# Patient Record
Sex: Female | Born: 1960 | Race: White | Hispanic: No | Smoking: Former smoker
Health system: Southern US, Community
[De-identification: ages and names within clinical notes are randomized; demographics above are authoritative.]

## PROBLEM LIST (undated history)

## (undated) DIAGNOSIS — F32A Depression, unspecified: Secondary | ICD-10-CM

## (undated) DIAGNOSIS — K219 Gastro-esophageal reflux disease without esophagitis: Secondary | ICD-10-CM

## (undated) DIAGNOSIS — E538 Deficiency of other specified B group vitamins: Secondary | ICD-10-CM

## (undated) DIAGNOSIS — M79606 Pain in leg, unspecified: Secondary | ICD-10-CM

## (undated) DIAGNOSIS — F329 Major depressive disorder, single episode, unspecified: Secondary | ICD-10-CM

## (undated) DIAGNOSIS — IMO0001 Reserved for inherently not codable concepts without codable children: Secondary | ICD-10-CM

## (undated) DIAGNOSIS — G9050 Complex regional pain syndrome I, unspecified: Secondary | ICD-10-CM

## (undated) DIAGNOSIS — J449 Chronic obstructive pulmonary disease, unspecified: Secondary | ICD-10-CM

## (undated) DIAGNOSIS — M25529 Pain in unspecified elbow: Secondary | ICD-10-CM

## (undated) DIAGNOSIS — I10 Essential (primary) hypertension: Secondary | ICD-10-CM

## (undated) DIAGNOSIS — I509 Heart failure, unspecified: Secondary | ICD-10-CM

## (undated) DIAGNOSIS — E559 Vitamin D deficiency, unspecified: Secondary | ICD-10-CM

## (undated) DIAGNOSIS — F419 Anxiety disorder, unspecified: Secondary | ICD-10-CM

## (undated) DIAGNOSIS — Z9689 Presence of other specified functional implants: Secondary | ICD-10-CM

## (undated) HISTORY — DX: Deficiency of other specified B group vitamins: E53.8

## (undated) HISTORY — PX: SPINAL CORD STIMULATOR IMPLANT: SHX2422

## (undated) HISTORY — DX: Gastro-esophageal reflux disease without esophagitis: K21.9

## (undated) HISTORY — DX: Anxiety disorder, unspecified: F41.9

## (undated) HISTORY — DX: Presence of other specified functional implants: Z96.89

## (undated) HISTORY — DX: Reserved for inherently not codable concepts without codable children: IMO0001

## (undated) HISTORY — DX: Vitamin D deficiency, unspecified: E55.9

## (undated) HISTORY — DX: Heart failure, unspecified: I50.9

## (undated) HISTORY — DX: Chronic obstructive pulmonary disease, unspecified: J44.9

## (undated) HISTORY — DX: Essential (primary) hypertension: I10

## (undated) HISTORY — DX: Pain in unspecified elbow: M25.529

## (undated) HISTORY — DX: Complex regional pain syndrome I, unspecified: G90.50

## (undated) HISTORY — DX: Major depressive disorder, single episode, unspecified: F32.9

## (undated) HISTORY — DX: Pain in leg, unspecified: M79.606

## (undated) HISTORY — DX: Depression, unspecified: F32.A

---

## 2003-02-19 ENCOUNTER — Encounter: Admission: RE | Admit: 2003-02-19 | Discharge: 2003-02-19 | Payer: Self-pay | Admitting: Pain Medicine

## 2003-02-19 ENCOUNTER — Encounter: Payer: Self-pay | Admitting: Pain Medicine

## 2005-03-25 ENCOUNTER — Ambulatory Visit: Payer: Self-pay | Admitting: Physician Assistant

## 2005-03-26 ENCOUNTER — Ambulatory Visit: Payer: Self-pay | Admitting: Pain Medicine

## 2005-04-07 ENCOUNTER — Ambulatory Visit: Payer: Self-pay | Admitting: Pain Medicine

## 2005-04-15 ENCOUNTER — Ambulatory Visit: Payer: Self-pay | Admitting: Physician Assistant

## 2005-04-17 ENCOUNTER — Ambulatory Visit: Payer: Self-pay | Admitting: Pain Medicine

## 2005-04-23 ENCOUNTER — Ambulatory Visit: Payer: Self-pay | Admitting: Physician Assistant

## 2005-04-28 ENCOUNTER — Ambulatory Visit: Payer: Self-pay | Admitting: Pain Medicine

## 2005-04-29 ENCOUNTER — Ambulatory Visit: Payer: Self-pay | Admitting: Internal Medicine

## 2005-04-29 ENCOUNTER — Ambulatory Visit: Payer: Self-pay | Admitting: Physician Assistant

## 2005-05-20 ENCOUNTER — Ambulatory Visit: Payer: Self-pay | Admitting: Pain Medicine

## 2005-06-30 ENCOUNTER — Ambulatory Visit: Payer: Self-pay | Admitting: Pain Medicine

## 2005-07-22 ENCOUNTER — Ambulatory Visit: Payer: Self-pay | Admitting: Pain Medicine

## 2005-08-03 ENCOUNTER — Ambulatory Visit: Payer: Self-pay | Admitting: Pain Medicine

## 2005-08-13 ENCOUNTER — Emergency Department: Payer: Self-pay | Admitting: Emergency Medicine

## 2005-08-19 ENCOUNTER — Ambulatory Visit: Payer: Self-pay | Admitting: Physician Assistant

## 2005-08-20 ENCOUNTER — Ambulatory Visit: Payer: Self-pay | Admitting: Pain Medicine

## 2005-08-26 ENCOUNTER — Emergency Department: Payer: Self-pay | Admitting: Emergency Medicine

## 2005-08-31 ENCOUNTER — Ambulatory Visit: Payer: Self-pay | Admitting: Physician Assistant

## 2005-09-01 ENCOUNTER — Ambulatory Visit: Payer: Self-pay | Admitting: Pain Medicine

## 2005-09-21 ENCOUNTER — Ambulatory Visit: Payer: Self-pay | Admitting: Pain Medicine

## 2005-10-01 ENCOUNTER — Ambulatory Visit: Payer: Self-pay | Admitting: Pain Medicine

## 2005-11-02 ENCOUNTER — Ambulatory Visit: Payer: Self-pay | Admitting: Pain Medicine

## 2005-11-05 ENCOUNTER — Ambulatory Visit: Payer: Self-pay | Admitting: Physician Assistant

## 2005-11-17 ENCOUNTER — Ambulatory Visit: Payer: Self-pay | Admitting: Family Medicine

## 2005-12-07 ENCOUNTER — Ambulatory Visit: Payer: Self-pay | Admitting: Gastroenterology

## 2005-12-17 ENCOUNTER — Ambulatory Visit: Payer: Self-pay | Admitting: Gastroenterology

## 2005-12-28 ENCOUNTER — Ambulatory Visit: Payer: Self-pay | Admitting: Pain Medicine

## 2005-12-29 ENCOUNTER — Ambulatory Visit: Payer: Self-pay | Admitting: Pain Medicine

## 2006-01-27 ENCOUNTER — Ambulatory Visit: Payer: Self-pay | Admitting: Physician Assistant

## 2006-02-17 ENCOUNTER — Ambulatory Visit: Payer: Self-pay | Admitting: Pain Medicine

## 2006-02-25 ENCOUNTER — Ambulatory Visit: Payer: Self-pay | Admitting: Pain Medicine

## 2006-03-15 ENCOUNTER — Ambulatory Visit: Payer: Self-pay | Admitting: Pain Medicine

## 2006-03-16 ENCOUNTER — Ambulatory Visit: Payer: Self-pay | Admitting: Pain Medicine

## 2006-04-19 ENCOUNTER — Ambulatory Visit: Payer: Self-pay | Admitting: Pain Medicine

## 2006-05-04 ENCOUNTER — Ambulatory Visit: Payer: Self-pay | Admitting: Pain Medicine

## 2006-05-19 ENCOUNTER — Ambulatory Visit: Payer: Self-pay | Admitting: Pain Medicine

## 2006-06-01 ENCOUNTER — Ambulatory Visit: Payer: Self-pay | Admitting: Pain Medicine

## 2006-07-15 ENCOUNTER — Ambulatory Visit: Payer: Self-pay | Admitting: Physician Assistant

## 2006-08-11 ENCOUNTER — Ambulatory Visit: Payer: Self-pay | Admitting: Physician Assistant

## 2006-08-17 ENCOUNTER — Ambulatory Visit: Payer: Self-pay | Admitting: Pain Medicine

## 2006-09-10 ENCOUNTER — Ambulatory Visit: Payer: Self-pay | Admitting: Physician Assistant

## 2006-10-15 ENCOUNTER — Ambulatory Visit: Payer: Self-pay | Admitting: Physician Assistant

## 2006-11-15 ENCOUNTER — Ambulatory Visit: Payer: Self-pay | Admitting: Physician Assistant

## 2006-12-09 ENCOUNTER — Ambulatory Visit: Payer: Self-pay | Admitting: Physician Assistant

## 2007-01-11 ENCOUNTER — Ambulatory Visit: Payer: Self-pay | Admitting: Physician Assistant

## 2007-02-10 ENCOUNTER — Ambulatory Visit: Payer: Self-pay | Admitting: Physician Assistant

## 2007-03-11 ENCOUNTER — Ambulatory Visit: Payer: Self-pay | Admitting: Physician Assistant

## 2007-04-06 ENCOUNTER — Ambulatory Visit: Payer: Self-pay | Admitting: Physician Assistant

## 2007-05-05 ENCOUNTER — Ambulatory Visit: Payer: Self-pay | Admitting: Physician Assistant

## 2007-06-10 ENCOUNTER — Ambulatory Visit: Payer: Self-pay | Admitting: Physician Assistant

## 2007-07-11 ENCOUNTER — Ambulatory Visit: Payer: Self-pay | Admitting: Physician Assistant

## 2007-08-10 ENCOUNTER — Ambulatory Visit: Payer: Self-pay | Admitting: Pain Medicine

## 2007-08-12 ENCOUNTER — Ambulatory Visit: Payer: Self-pay | Admitting: Pain Medicine

## 2007-08-22 ENCOUNTER — Ambulatory Visit: Payer: Self-pay | Admitting: Pain Medicine

## 2007-08-23 ENCOUNTER — Ambulatory Visit: Payer: Self-pay | Admitting: Pain Medicine

## 2007-08-30 ENCOUNTER — Ambulatory Visit: Payer: Self-pay | Admitting: Pain Medicine

## 2007-09-07 ENCOUNTER — Ambulatory Visit: Payer: Self-pay | Admitting: Physician Assistant

## 2007-09-21 ENCOUNTER — Ambulatory Visit: Payer: Self-pay | Admitting: Physician Assistant

## 2007-09-28 ENCOUNTER — Ambulatory Visit: Payer: Self-pay | Admitting: Physician Assistant

## 2007-11-09 ENCOUNTER — Ambulatory Visit: Payer: Self-pay | Admitting: Physician Assistant

## 2007-12-08 ENCOUNTER — Ambulatory Visit: Payer: Self-pay | Admitting: Physician Assistant

## 2008-01-18 ENCOUNTER — Emergency Department: Payer: Self-pay | Admitting: Emergency Medicine

## 2008-01-25 ENCOUNTER — Ambulatory Visit: Payer: Self-pay | Admitting: Physician Assistant

## 2008-01-31 ENCOUNTER — Ambulatory Visit: Payer: Self-pay | Admitting: Pain Medicine

## 2008-02-07 ENCOUNTER — Ambulatory Visit: Payer: Self-pay | Admitting: Pain Medicine

## 2008-02-15 ENCOUNTER — Ambulatory Visit: Payer: Self-pay | Admitting: Pain Medicine

## 2008-03-29 ENCOUNTER — Ambulatory Visit: Payer: Self-pay | Admitting: Physician Assistant

## 2008-05-02 ENCOUNTER — Ambulatory Visit: Payer: Self-pay | Admitting: Physician Assistant

## 2008-05-30 ENCOUNTER — Ambulatory Visit: Payer: Self-pay | Admitting: Physician Assistant

## 2008-06-28 ENCOUNTER — Ambulatory Visit: Payer: Self-pay | Admitting: Physician Assistant

## 2008-07-30 ENCOUNTER — Ambulatory Visit: Payer: Self-pay | Admitting: Physician Assistant

## 2008-08-30 ENCOUNTER — Ambulatory Visit: Payer: Self-pay | Admitting: Physician Assistant

## 2008-09-27 ENCOUNTER — Ambulatory Visit: Payer: Self-pay | Admitting: Physician Assistant

## 2008-10-25 ENCOUNTER — Ambulatory Visit: Payer: Self-pay | Admitting: Physician Assistant

## 2008-11-28 ENCOUNTER — Ambulatory Visit: Payer: Self-pay | Admitting: Physician Assistant

## 2008-12-26 ENCOUNTER — Ambulatory Visit: Payer: Self-pay | Admitting: Pain Medicine

## 2009-01-23 ENCOUNTER — Ambulatory Visit: Payer: Self-pay | Admitting: Physician Assistant

## 2009-01-24 ENCOUNTER — Ambulatory Visit: Payer: Self-pay | Admitting: Pain Medicine

## 2009-02-21 ENCOUNTER — Ambulatory Visit: Payer: Self-pay | Admitting: Physician Assistant

## 2009-03-28 ENCOUNTER — Ambulatory Visit: Payer: Self-pay | Admitting: Physician Assistant

## 2009-04-25 ENCOUNTER — Ambulatory Visit: Payer: Self-pay | Admitting: Physician Assistant

## 2009-05-28 ENCOUNTER — Ambulatory Visit: Payer: Self-pay | Admitting: Physician Assistant

## 2009-06-12 ENCOUNTER — Inpatient Hospital Stay: Payer: Self-pay | Admitting: Internal Medicine

## 2009-06-27 ENCOUNTER — Ambulatory Visit: Payer: Self-pay | Admitting: Physician Assistant

## 2009-07-25 ENCOUNTER — Ambulatory Visit: Payer: Self-pay | Admitting: Physician Assistant

## 2009-08-06 ENCOUNTER — Ambulatory Visit: Payer: Self-pay | Admitting: Emergency Medicine

## 2009-08-27 ENCOUNTER — Ambulatory Visit: Payer: Self-pay | Admitting: Physician Assistant

## 2009-09-26 ENCOUNTER — Ambulatory Visit: Payer: Self-pay | Admitting: Physician Assistant

## 2009-10-24 ENCOUNTER — Ambulatory Visit: Payer: Self-pay | Admitting: Physician Assistant

## 2009-11-25 ENCOUNTER — Ambulatory Visit: Payer: Self-pay | Admitting: Pain Medicine

## 2009-12-26 ENCOUNTER — Ambulatory Visit: Payer: Self-pay | Admitting: Pain Medicine

## 2010-01-17 ENCOUNTER — Ambulatory Visit: Payer: Self-pay | Admitting: Pain Medicine

## 2010-01-27 ENCOUNTER — Ambulatory Visit: Payer: Self-pay | Admitting: Pain Medicine

## 2010-02-20 ENCOUNTER — Ambulatory Visit: Payer: Self-pay | Admitting: Pain Medicine

## 2010-03-17 ENCOUNTER — Ambulatory Visit: Payer: Self-pay | Admitting: Pain Medicine

## 2010-04-11 ENCOUNTER — Emergency Department: Payer: Self-pay | Admitting: Internal Medicine

## 2010-06-19 ENCOUNTER — Ambulatory Visit: Payer: Self-pay | Admitting: Pain Medicine

## 2010-07-07 ENCOUNTER — Ambulatory Visit: Payer: Self-pay | Admitting: Pain Medicine

## 2010-08-15 ENCOUNTER — Ambulatory Visit: Payer: Self-pay | Admitting: Pain Medicine

## 2010-08-19 ENCOUNTER — Ambulatory Visit: Payer: Self-pay | Admitting: Pain Medicine

## 2010-08-26 ENCOUNTER — Ambulatory Visit: Payer: Self-pay | Admitting: Pain Medicine

## 2010-09-10 ENCOUNTER — Ambulatory Visit: Payer: Self-pay | Admitting: Family Medicine

## 2010-09-10 ENCOUNTER — Ambulatory Visit: Payer: Self-pay | Admitting: Pain Medicine

## 2010-09-18 ENCOUNTER — Ambulatory Visit: Payer: Self-pay | Admitting: Pain Medicine

## 2010-11-27 ENCOUNTER — Ambulatory Visit: Payer: Self-pay | Admitting: Pain Medicine

## 2010-12-22 ENCOUNTER — Other Ambulatory Visit: Payer: Self-pay | Admitting: Pain Medicine

## 2010-12-23 ENCOUNTER — Ambulatory Visit: Payer: Self-pay | Admitting: Pain Medicine

## 2011-01-01 ENCOUNTER — Ambulatory Visit: Payer: Self-pay | Admitting: Pain Medicine

## 2011-01-13 ENCOUNTER — Ambulatory Visit: Payer: Self-pay | Admitting: Pain Medicine

## 2011-01-22 ENCOUNTER — Ambulatory Visit: Payer: Self-pay | Admitting: Pain Medicine

## 2011-02-12 ENCOUNTER — Ambulatory Visit: Payer: Self-pay | Admitting: Pain Medicine

## 2011-03-02 ENCOUNTER — Ambulatory Visit: Payer: Self-pay | Admitting: Pain Medicine

## 2011-04-06 ENCOUNTER — Ambulatory Visit: Payer: Self-pay | Admitting: Pain Medicine

## 2011-04-30 ENCOUNTER — Other Ambulatory Visit: Payer: Self-pay | Admitting: Pain Medicine

## 2011-07-06 ENCOUNTER — Ambulatory Visit: Payer: Self-pay | Admitting: Pain Medicine

## 2011-09-30 ENCOUNTER — Ambulatory Visit: Payer: Self-pay | Admitting: Pain Medicine

## 2011-10-21 ENCOUNTER — Ambulatory Visit: Payer: Self-pay | Admitting: Pain Medicine

## 2011-10-22 ENCOUNTER — Ambulatory Visit: Payer: Self-pay | Admitting: Pain Medicine

## 2012-01-20 ENCOUNTER — Ambulatory Visit: Payer: Self-pay | Admitting: Pain Medicine

## 2012-04-12 ENCOUNTER — Ambulatory Visit: Payer: Self-pay | Admitting: Pain Medicine

## 2012-05-04 ENCOUNTER — Ambulatory Visit: Payer: Self-pay | Admitting: Pain Medicine

## 2012-07-11 ENCOUNTER — Ambulatory Visit: Payer: Self-pay | Admitting: Pain Medicine

## 2012-08-17 ENCOUNTER — Ambulatory Visit: Payer: Self-pay | Admitting: Pain Medicine

## 2012-10-17 ENCOUNTER — Ambulatory Visit: Payer: Self-pay | Admitting: Pain Medicine

## 2013-01-12 ENCOUNTER — Ambulatory Visit: Payer: Self-pay | Admitting: Pain Medicine

## 2013-05-12 ENCOUNTER — Ambulatory Visit: Payer: Self-pay | Admitting: Pain Medicine

## 2013-05-16 ENCOUNTER — Ambulatory Visit: Payer: Self-pay | Admitting: Pain Medicine

## 2013-06-09 ENCOUNTER — Ambulatory Visit: Payer: Self-pay | Admitting: Pain Medicine

## 2013-06-20 ENCOUNTER — Ambulatory Visit: Payer: Self-pay | Admitting: Pain Medicine

## 2013-07-14 ENCOUNTER — Ambulatory Visit: Payer: Self-pay | Admitting: Pain Medicine

## 2013-08-08 ENCOUNTER — Ambulatory Visit: Payer: Self-pay | Admitting: Pain Medicine

## 2013-09-11 ENCOUNTER — Ambulatory Visit: Payer: Self-pay | Admitting: Pain Medicine

## 2013-11-02 ENCOUNTER — Ambulatory Visit: Payer: Self-pay | Admitting: Pain Medicine

## 2013-12-11 ENCOUNTER — Ambulatory Visit: Payer: Self-pay | Admitting: Pain Medicine

## 2013-12-29 ENCOUNTER — Ambulatory Visit: Payer: Self-pay | Admitting: Pain Medicine

## 2014-01-03 ENCOUNTER — Ambulatory Visit: Payer: Self-pay | Admitting: Pain Medicine

## 2014-01-10 ENCOUNTER — Ambulatory Visit: Payer: Self-pay | Admitting: Pain Medicine

## 2014-01-26 ENCOUNTER — Ambulatory Visit: Payer: Self-pay | Admitting: Pain Medicine

## 2014-02-06 ENCOUNTER — Ambulatory Visit: Payer: Self-pay | Admitting: Pain Medicine

## 2014-02-13 ENCOUNTER — Ambulatory Visit: Payer: Self-pay | Admitting: Pain Medicine

## 2014-02-16 ENCOUNTER — Emergency Department: Payer: Self-pay | Admitting: Emergency Medicine

## 2014-02-16 LAB — CBC WITH DIFFERENTIAL/PLATELET
BASOS PCT: 1.1 %
Basophil #: 0.1 10*3/uL (ref 0.0–0.1)
EOS ABS: 0.1 10*3/uL (ref 0.0–0.7)
EOS PCT: 1 %
HCT: 42.9 % (ref 35.0–47.0)
HGB: 14.5 g/dL (ref 12.0–16.0)
LYMPHS ABS: 3.2 10*3/uL (ref 1.0–3.6)
Lymphocyte %: 40 %
MCH: 30.7 pg (ref 26.0–34.0)
MCHC: 33.9 g/dL (ref 32.0–36.0)
MCV: 91 fL (ref 80–100)
MONO ABS: 0.5 x10 3/mm (ref 0.2–0.9)
MONOS PCT: 5.7 %
NEUTROS ABS: 4.2 10*3/uL (ref 1.4–6.5)
NEUTROS PCT: 52.2 %
PLATELETS: 245 10*3/uL (ref 150–440)
RBC: 4.72 10*6/uL (ref 3.80–5.20)
RDW: 13.8 % (ref 11.5–14.5)
WBC: 8 10*3/uL (ref 3.6–11.0)

## 2014-02-16 LAB — COMPREHENSIVE METABOLIC PANEL
ANION GAP: 6 — AB (ref 7–16)
Albumin: 3.7 g/dL (ref 3.4–5.0)
Alkaline Phosphatase: 120 U/L — ABNORMAL HIGH
BILIRUBIN TOTAL: 0.4 mg/dL (ref 0.2–1.0)
BUN: 8 mg/dL (ref 7–18)
CHLORIDE: 105 mmol/L (ref 98–107)
Calcium, Total: 9.1 mg/dL (ref 8.5–10.1)
Co2: 27 mmol/L (ref 21–32)
Creatinine: 0.7 mg/dL (ref 0.60–1.30)
EGFR (Non-African Amer.): >60
GLUCOSE: 109 mg/dL — AB (ref 65–99)
Osmolality: 275 (ref 275–301)
Potassium: 3.8 mmol/L (ref 3.5–5.1)
SGOT(AST): 14 U/L — ABNORMAL LOW (ref 15–37)
SGPT (ALT): 17 U/L (ref 12–78)
SODIUM: 138 mmol/L (ref 136–145)
Total Protein: 7.5 g/dL (ref 6.4–8.2)

## 2014-02-16 LAB — CK-MB

## 2014-02-20 ENCOUNTER — Ambulatory Visit: Payer: Self-pay | Admitting: Pain Medicine

## 2014-02-27 ENCOUNTER — Ambulatory Visit: Payer: Self-pay | Admitting: Pain Medicine

## 2014-03-06 ENCOUNTER — Ambulatory Visit: Payer: Self-pay | Admitting: Pain Medicine

## 2014-03-06 ENCOUNTER — Inpatient Hospital Stay: Payer: Self-pay | Admitting: Pain Medicine

## 2014-03-08 LAB — CBC WITH DIFFERENTIAL/PLATELET
Basophil #: 0.1 10*3/uL (ref 0.0–0.1)
Basophil %: 1.4 %
EOS ABS: 0.1 10*3/uL (ref 0.0–0.7)
EOS PCT: 1.6 %
HCT: 38.9 % (ref 35.0–47.0)
HGB: 13 g/dL (ref 12.0–16.0)
Lymphocyte #: 3.6 10*3/uL (ref 1.0–3.6)
Lymphocyte %: 41.3 %
MCH: 30.6 pg (ref 26.0–34.0)
MCHC: 33.5 g/dL (ref 32.0–36.0)
MCV: 91 fL (ref 80–100)
MONO ABS: 0.7 x10 3/mm (ref 0.2–0.9)
MONOS PCT: 7.9 %
Neutrophil #: 4.1 10*3/uL (ref 1.4–6.5)
Neutrophil %: 47.8 %
PLATELETS: 214 10*3/uL (ref 150–440)
RBC: 4.26 10*6/uL (ref 3.80–5.20)
RDW: 14 % (ref 11.5–14.5)
WBC: 8.6 10*3/uL (ref 3.6–11.0)

## 2014-03-08 LAB — BASIC METABOLIC PANEL
ANION GAP: 7 (ref 7–16)
BUN: 10 mg/dL (ref 7–18)
CALCIUM: 8.9 mg/dL (ref 8.5–10.1)
CO2: 29 mmol/L (ref 21–32)
Chloride: 103 mmol/L (ref 98–107)
Creatinine: 0.77 mg/dL (ref 0.60–1.30)
EGFR (Non-African Amer.): >60
Glucose: 98 mg/dL (ref 65–99)
OSMOLALITY: 277 (ref 275–301)
Potassium: 4 mmol/L (ref 3.5–5.1)
Sodium: 139 mmol/L (ref 136–145)

## 2014-03-08 LAB — HEPATIC FUNCTION PANEL A (ARMC)
ALK PHOS: 85 U/L
Albumin: 3.1 g/dL — ABNORMAL LOW (ref 3.4–5.0)
Bilirubin, Direct: 0.2 mg/dL (ref 0.00–0.20)
Bilirubin,Total: 0.7 mg/dL (ref 0.2–1.0)
SGOT(AST): 10 U/L — ABNORMAL LOW (ref 15–37)
SGPT (ALT): 9 U/L — ABNORMAL LOW (ref 12–78)
Total Protein: 6.4 g/dL (ref 6.4–8.2)

## 2014-03-09 LAB — SEDIMENTATION RATE: ERYTHROCYTE SED RATE: 37 mm/h — AB (ref 0–30)

## 2014-03-09 LAB — MAGNESIUM: Magnesium: 2.2 mg/dL

## 2014-03-10 LAB — CK: CK, TOTAL: 55 U/L

## 2014-03-26 ENCOUNTER — Ambulatory Visit: Payer: Self-pay | Admitting: Pain Medicine

## 2014-03-27 ENCOUNTER — Ambulatory Visit: Payer: Self-pay | Admitting: Pain Medicine

## 2014-04-03 ENCOUNTER — Ambulatory Visit: Payer: Self-pay | Admitting: Pain Medicine

## 2014-04-10 ENCOUNTER — Ambulatory Visit: Payer: Self-pay | Admitting: Pain Medicine

## 2014-04-26 ENCOUNTER — Ambulatory Visit: Payer: Self-pay | Admitting: Pain Medicine

## 2014-05-03 ENCOUNTER — Ambulatory Visit: Payer: Self-pay | Admitting: Pain Medicine

## 2014-05-09 ENCOUNTER — Ambulatory Visit: Payer: Self-pay | Admitting: Pain Medicine

## 2014-07-24 ENCOUNTER — Ambulatory Visit: Payer: Self-pay | Admitting: Pain Medicine

## 2015-02-02 NOTE — Consult Note (Signed)
Elizabeth SinkRita [name] Tapering Schedule. To be discussed with patient on next visit.  Xanax (Alprazolam) Tapering Instructions:  ? Go from taking a 1mg  pill 3 times a day to? ? Take a 0.5mg  pill 5 times a day x 14 days, then? ? Take a 0.5mg  pill 4 times a day x 14 days, then? ? Take a 0.5mg  pill 3 times a day x 14 days, then? ? Take a 0.25mg  pill 5 times a day x 14 days, then? ? Take a 0.25mg  pill 4 times a day x 14 days, then? ? Take a 0.25mg  pill 3 times a day x 14 days, then? ? Take a 0.25mg  pill 2 times a day x 14 days, then? ? Take a 0.25mg  pill 1 time a day x 14 days, then stop taking it.  Total time to stopping medication: 16 weeks Total number of 0.5mg  pills use: 168 Total number of 0.25mg  pills use: 210   Oxycodone Tapering Instructions:  ? Go from taking two(2) oxycodone 15mg  pills 5 times a day (10 pills/day) to? ? Taking oxycodone 15mg  pills, one(1) pill 9 pills/day x 7 days, then? ? Taking oxycodone 15mg  pills, one(1) pill 8 pills/day x 7 days, then? ? Taking oxycodone 15mg  pills, one(1) pill 7 pills/day x 7 days, then? ? Taking oxycodone 15mg  pills, one(1) pill 6 pills/day x 7 days, then? ? Taking oxycodone 15mg  pills, one(1) pill 5 pills/day x 7 days, then? ? Taking oxycodone 15mg  pills, one(1) pill 4 pills/day x 7 days, then? ? Taking oxycodone 15mg  pills, one(1) pill 3 pills/day x 7 days, then? ? Taking oxycodone 5mg  pills two(2) pills 4 times/day x 7 days, then? ? Taking oxycodone 5mg  pills two(2) pills 3 times/day x 7 days, then? ? Taking oxycodone 5mg  pills, one(1) pill 5 times/day x 7 days, then? ? Taking oxycodone 5mg  pills, one(1) pill 4 times/day x 7 days, then? ? Taking oxycodone 5mg  pills, one(1) pill 3 times/day x 7 days, then? ? Taking oxycodone 5mg  pills, one(1) pill 2 times/day x 7 days, then? ? Taking oxycodone 5mg  pills 1 time/day x 7 days, then stop taking it.   Total time to stopping medication: 14 weeks Total number of 15mg  pills use: 294 Total number  of 0.25mg  pills use: 203    Electronic Signatures: Elizabeth FractionNaveira, Elizabeth [name] (MD)  (Signed on 09-Apr-15 19:41)  Authored  Last Updated: 09-Apr-15 19:41 by Elizabeth FractionNaveira, Amyla Heffner [name] (MD)

## 2015-02-02 NOTE — Discharge Summary (Signed)
PATIENT NAME:  Elizabeth [name], Elizabeth [name] DATE OF BIRTH:  [birth year]  DATE OF ADMISSION:  03/06/2014 DATE OF DISCHARGE:  03/11/2014  COMPLICATIONS: None.  PROCEDURE:  1.  Insertion of thoracic T2/T3 epidural catheter with continuous infusion of local anesthetics and opioids for a continuous thoracic sympathectomy.  2.  Continuous infusion of local anesthetics and opioids for thoracic sympathectomy for the treatment of a complex regional pain syndrome.   DIAGNOSES: 1.  Acute on chronic left upper extremity complex regional pain syndrome, type I with severe swelling of the extremity (4 over 4 pitting edema.).  2.  Acute on chronic complex regional pain syndrome of the left lower extremity, type I  with severe swelling (4 over 4 pitting edema).  3.  Acute on chronic complex regional pain syndrome of the right upper extremity, type I with evidence of the beginnings of swelling (0.5 to 1 over 4 pitting edema).   ADMISSION SUMMARY: The patient was admitted to the hospital through the pain clinic on Mar 06, 2014 at which time an upper thoracic epidural catheter was placed under fluoroscopic guidance and tunneled for a small section under the skin in order to prevent infections. This was done under sterile conditions using fluoroscopic guidance. The position of the catheter at about the T2/T3 level on the left side. Once the catheter was in place, the patient was admitted to the hospital for a continuous infusion of the device. We used a combination of local anesthetic and fentanyl through the catheter in order to achieve the continuous sympathectomy. Over the next couple of days, clear evidence of decreased swelling was observed primarily in the left upper extremity and right upper extremity to  the point where on day 2, the patient no longer had any swelling of the right upper extremity and the swelling of her left upper extremity was down to a 3 over 4 pitting edema. Over the next couple of days, we  continued to see a decrease in the swelling to the point that by May 29. the patient no longer had pitting edema of either of the upper extremities and the left lower extremity had gone down to approximately a 1 to 2 over 4. By May 31, all 4 extremities were now within normal limits. We observed the edema of the patient's calf go down as well as the pain. At one point, the left leg pain had significant pain and tenderness to palpation over the calf area, highly suggesting the possibility of a deep venous thrombosis for which an ultrasound was conducted and it showed to be negative. This suggested that the pain that she was experiencing in her calf was probably secondary to a compartment syndrome caused by the edema in that lower extremity. By the time the patient was discharged from the hospital, all 4 extremities looked fairly normal except for some decreased range of motion. We had the physical therapist work on her starting on May 28, once the edema had gone down and this has considerably helped in recovering some of the range of motion of the left upper extremity as well as her feet and toes on the left lower extremity. On Mar 11, 2014, the epidural catheter was removed and the infusions stopped. The patient was then discharged home with her medication refills with an appointment to return to see us in 2 weeks for followup.    ____________________________ Sydnee LevansFrancisco A. Laban EmperorNaveira, MD fan:aw D: 03/20/2014 10:07:13 ET T: 03/20/2014 10:41:21 ET JOB#: [id number]  cc: Para MarchFrancisco  Erlinda Hong, MD, <Dictator> Oswaldo Done MD ELECTRONICALLY SIGNED 03/20/2014 12:51

## 2015-07-25 ENCOUNTER — Ambulatory Visit: Payer: Self-pay | Attending: Pain Medicine | Admitting: Pain Medicine

## 2015-07-25 ENCOUNTER — Encounter: Payer: Self-pay | Admitting: Pain Medicine

## 2015-07-25 ENCOUNTER — Ambulatory Visit: Payer: Self-pay | Admitting: Pain Medicine

## 2015-07-25 VITALS — BP 126/79 | HR 86 | Temp 98.7°F | Resp 16 | Ht <= 58 in | Wt 120.0 lb

## 2015-07-25 DIAGNOSIS — M545 Low back pain, unspecified: Secondary | ICD-10-CM

## 2015-07-25 DIAGNOSIS — Z0289 Encounter for other administrative examinations: Secondary | ICD-10-CM

## 2015-07-25 DIAGNOSIS — M79606 Pain in leg, unspecified: Secondary | ICD-10-CM

## 2015-07-25 DIAGNOSIS — Z969 Presence of functional implant, unspecified: Secondary | ICD-10-CM

## 2015-07-25 DIAGNOSIS — E538 Deficiency of other specified B group vitamins: Secondary | ICD-10-CM | POA: Insufficient documentation

## 2015-07-25 DIAGNOSIS — G894 Chronic pain syndrome: Secondary | ICD-10-CM | POA: Insufficient documentation

## 2015-07-25 DIAGNOSIS — F1120 Opioid dependence, uncomplicated: Secondary | ICD-10-CM

## 2015-07-25 DIAGNOSIS — J441 Chronic obstructive pulmonary disease with (acute) exacerbation: Secondary | ICD-10-CM | POA: Insufficient documentation

## 2015-07-25 DIAGNOSIS — G90529 Complex regional pain syndrome I of unspecified lower limb: Secondary | ICD-10-CM | POA: Insufficient documentation

## 2015-07-25 DIAGNOSIS — Z5181 Encounter for therapeutic drug level monitoring: Secondary | ICD-10-CM | POA: Insufficient documentation

## 2015-07-25 DIAGNOSIS — J449 Chronic obstructive pulmonary disease, unspecified: Secondary | ICD-10-CM | POA: Insufficient documentation

## 2015-07-25 DIAGNOSIS — F411 Generalized anxiety disorder: Secondary | ICD-10-CM | POA: Insufficient documentation

## 2015-07-25 DIAGNOSIS — F1190 Opioid use, unspecified, uncomplicated: Secondary | ICD-10-CM | POA: Insufficient documentation

## 2015-07-25 DIAGNOSIS — E559 Vitamin D deficiency, unspecified: Secondary | ICD-10-CM | POA: Insufficient documentation

## 2015-07-25 DIAGNOSIS — I509 Heart failure, unspecified: Secondary | ICD-10-CM

## 2015-07-25 DIAGNOSIS — I5032 Chronic diastolic (congestive) heart failure: Secondary | ICD-10-CM | POA: Insufficient documentation

## 2015-07-25 DIAGNOSIS — G90519 Complex regional pain syndrome I of unspecified upper limb: Secondary | ICD-10-CM | POA: Insufficient documentation

## 2015-07-25 DIAGNOSIS — G90523 Complex regional pain syndrome I of lower limb, bilateral: Secondary | ICD-10-CM

## 2015-07-25 DIAGNOSIS — M25529 Pain in unspecified elbow: Secondary | ICD-10-CM

## 2015-07-25 DIAGNOSIS — Z79891 Long term (current) use of opiate analgesic: Secondary | ICD-10-CM | POA: Insufficient documentation

## 2015-07-25 DIAGNOSIS — G8929 Other chronic pain: Secondary | ICD-10-CM

## 2015-07-25 DIAGNOSIS — G90513 Complex regional pain syndrome I of upper limb, bilateral: Secondary | ICD-10-CM

## 2015-07-25 DIAGNOSIS — M79629 Pain in unspecified upper arm: Secondary | ICD-10-CM

## 2015-07-25 HISTORY — DX: Heart failure, unspecified: I50.9

## 2015-07-25 HISTORY — DX: Pain in unspecified elbow: M25.529

## 2015-07-25 HISTORY — DX: Pain in leg, unspecified: M79.606

## 2015-07-25 MED ORDER — OXYCODONE HCL 5 MG PO CAPS: 5.0000 mg | ORAL_CAPSULE | Freq: Four times a day (QID) | ORAL | Status: DC | PRN

## 2015-07-25 MED ORDER — OXYCODONE HCL 5 MG PO TABS: 5.0000 mg | ORAL_TABLET | Freq: Four times a day (QID) | ORAL | Status: DC | PRN

## 2015-07-25 MED ORDER — ALPRAZOLAM 0.25 MG PO TABS: 0.2500 mg | ORAL_TABLET | Freq: Every evening | ORAL | Status: DC | PRN

## 2015-07-25 NOTE — Progress Notes (Signed)
Safety precautions to be maintained throughout the outpatient stay will include: orient to surroundings, keep bed in low position, maintain call bell within reach at all times, provide assistance with transfer out of bed and ambulation. Oxycodone pill count # 6 Xanax pill count # 2

## 2015-07-25 NOTE — Progress Notes (Signed)
Patient's Name: Elizabeth [name] MRN: [id number] DOB: 1961-08-28 DOS: 07/25/2015  Primary Reason(s) for Visit: Encounter for Medication Management. CC: No chief complaint on file.   HPI:   Elizabeth [name] is a 54 y.o. year old, female patient, who returns today as an established patient. She has Chronic low back pain; Generalized anxiety disorder; Encounter for therapeutic drug level monitoring; Long term current use of opiate analgesic; Uncomplicated opioid dependence (HCC); Opiate use; Chronic pain syndrome; Complex regional pain syndrome I of upper limb; Complex regional pain syndrome I of lower limb; Opiate analgesic use agreement exists; Presence of spinal cord stimulator; Arm pain; Lower extremity pain; Chronic obstructive pulmonary disease (COPD) (HCC); Congestive heart failure (HCC); Vitamin D deficiency; and Vitamin B12 deficiency on her problem list.. Her primarily concern today is the No chief complaint on file.      Pharmacotherapy Review: Side-effects or Adverse reactions: None reported. Effectiveness: Described as relatively effective, allowing for increase in activities of daily living (ADL). Onset of action: Within expected pharmacological parameters. Duration of action: Within normal limits for medication. Peak effect: Timing and results are as within normal expected parameters. [place] PMP: Compliant with practice rules and regulations. DST: Compliant with practice rules and regulations. Lab work: No new labs ordered by our practice. Treatment compliance: Compliant. Substance Use Disorder (SUD) Risk Level: Low Planned course of action: Continue therapy as is.  Allergies: Elizabeth [name] is allergic to lisinopril.  Meds: The patient has a current medication list which includes the following prescription(s): albuterol, alprazolam, oxycodone, oxycodone, pantoprazole, and pramipexole. Requested Prescriptions   Signed Prescriptions Disp Refills  . oxycodone (OXY-IR) 5 MG capsule 120  capsule 0    Sig: Take 1 capsule (5 mg total) by mouth every 6 (six) hours as needed for pain.  Marland Kitchen ALPRAZolam (XANAX) 0.25 MG tablet 30 tablet 1    Sig: Take 1 tablet (0.25 mg total) by mouth at bedtime as needed for anxiety.  Marland Kitchen oxyCODONE (OXY IR/ROXICODONE) 5 MG immediate release tablet 120 tablet 0    Sig: Take 1 tablet (5 mg total) by mouth every 6 (six) hours as needed for severe pain.    ROS: Constitutional: Afebrile, no chills, well hydrated and well nourished Gastrointestinal: negative Musculoskeletal:negative Neurological: negative Behavioral/Psych: negative  PFSH: Medical:  Elizabeth [name]  has a past medical history of COPD (chronic obstructive pulmonary disease) (HCC); RSD (reflex sympathetic dystrophy); Spinal cord stimulator status; CHF (congestive heart failure) (HCC); Depression; Vitamin D deficiency; Vitamin B12 deficiency; Anxiety; Reflux; and Congestive heart failure (HCC) (07/25/2015). Family: family history includes Cancer in her father; Cirrhosis in her mother. Surgical:  has past surgical history that includes Spinal cord stimulator implant. Tobacco:  has no tobacco history on file. Alcohol:  has no alcohol history on file. Drug:  has no drug history on file.  Physical Exam: Vitals:  Today's Vitals   07/25/15 1152 07/25/15 1153  BP: 126/79   Pulse: 86   Temp: 98.7 F (37.1 C)   Resp: 16   Height:  (1.448 m)   Weight: 120 lb (54.432 kg)   SpO2: 96%   PainSc: 3  3   PainLoc: Arm   Calculated BMI: Body mass index is 25.96 kg/(m^2). General appearance: alert, cooperative, appears stated age and no distress Eyes: conjunctivae/corneas clear. PERRL, EOM's intact. Fundi benign. Lungs: No evidence respiratory distress, no audible rales or ronchi and no use of accessory muscles of respiration Neck: no adenopathy, no carotid bruit, no JVD, supple, symmetrical, trachea midline and  thyroid not enlarged, symmetric, no tenderness/mass/nodules Back: symmetric, no  curvature. ROM normal. No CVA tenderness. Extremities: extremities normal, atraumatic, no cyanosis or edema Pulses: 2+ and symmetric Skin: Skin color, texture, turgor normal. No rashes or lesions Neurologic: Grossly normal    Assessment: Encounter Diagnosis:  Primary Diagnosis: Chronic pain syndrome [G89.4]  Plan: Diagnoses and all orders for this visit:  Chronic pain syndrome -     oxycodone (OXY-IR) 5 MG capsule; Take 1 capsule (5 mg total) by mouth every 6 (six) hours as needed for pain. -     oxyCODONE (OXY IR/ROXICODONE) 5 MG immediate release tablet; Take 1 tablet (5 mg total) by mouth every 6 (six) hours as needed for severe pain.  Complex regional pain syndrome i of lower limb, bilateral  Complex regional pain syndrome i of upper limb, bilateral  Opiate use  Uncomplicated opioid dependence (HCC)  Long term current use of opiate analgesic -     Drugs of abuse screen w/o alc, rtn urine-sln; Standing  Encounter for therapeutic drug level monitoring  Generalized anxiety disorder  Chronic low back pain  Opiate analgesic use agreement exists  Presence of spinal cord stimulator  Pain in joint, upper arm, unspecified laterality  Pain of lower extremity, unspecified laterality  Chronic obstructive pulmonary disease, unspecified COPD type (HCC)  Chronic congestive heart failure, unspecified congestive heart failure type (HCC)  Vitamin D deficiency  Vitamin B12 deficiency  Other orders -     ALPRAZolam (XANAX) 0.25 MG tablet; Take 1 tablet (0.25 mg total) by mouth at bedtime as needed for anxiety.     There are no Patient Instructions on file for this visit. Medications discontinued today:  Medications Discontinued During This Encounter  Medication Reason  . oxycodone (OXY-IR) 5 MG capsule Reorder  . ALPRAZolam (XANAX) 0.25 MG tablet Reorder   Medications administered today:  Ms. Langston MaskerMorris had no medications administered during this visit.  Primary Care  Physician: No primary care provider on file. Location: [facility] Note by: Daniella Dewberry A. Laban EmperorNaveira, M.D, DABA, DABAPM, DABPM, DABIPP, FIPP

## 2015-08-07 ENCOUNTER — Encounter: Payer: Self-pay | Admitting: Pain Medicine

## 2015-08-16 ENCOUNTER — Other Ambulatory Visit: Payer: Self-pay | Admitting: Pain Medicine

## 2015-08-21 ENCOUNTER — Telehealth: Payer: Self-pay | Admitting: Pain Medicine

## 2015-08-21 NOTE — Telephone Encounter (Signed)
Her pharmacy is not open on Sun can we please let her fill them on sat and let her pharmacy know

## 2015-08-21 NOTE — Telephone Encounter (Signed)
Left message with patient to clarify what she needs filled as well as dates on Rx

## 2015-09-23 ENCOUNTER — Encounter: Payer: Self-pay | Admitting: Pain Medicine

## 2015-09-23 ENCOUNTER — Ambulatory Visit: Payer: Self-pay | Attending: Pain Medicine | Admitting: Pain Medicine

## 2015-09-23 ENCOUNTER — Other Ambulatory Visit: Payer: Self-pay | Admitting: Pain Medicine

## 2015-09-23 VITALS — BP 154/71 | HR 74 | Temp 98.3°F | Resp 20 | Ht <= 58 in | Wt 120.0 lb

## 2015-09-23 DIAGNOSIS — I509 Heart failure, unspecified: Secondary | ICD-10-CM | POA: Insufficient documentation

## 2015-09-23 DIAGNOSIS — Z79891 Long term (current) use of opiate analgesic: Secondary | ICD-10-CM

## 2015-09-23 DIAGNOSIS — G90513 Complex regional pain syndrome I of upper limb, bilateral: Secondary | ICD-10-CM | POA: Insufficient documentation

## 2015-09-23 DIAGNOSIS — G8929 Other chronic pain: Secondary | ICD-10-CM

## 2015-09-23 DIAGNOSIS — Z87891 Personal history of nicotine dependence: Secondary | ICD-10-CM | POA: Insufficient documentation

## 2015-09-23 DIAGNOSIS — Z0289 Encounter for other administrative examinations: Secondary | ICD-10-CM

## 2015-09-23 DIAGNOSIS — F1120 Opioid dependence, uncomplicated: Secondary | ICD-10-CM

## 2015-09-23 DIAGNOSIS — G90523 Complex regional pain syndrome I of lower limb, bilateral: Secondary | ICD-10-CM | POA: Insufficient documentation

## 2015-09-23 DIAGNOSIS — M79606 Pain in leg, unspecified: Secondary | ICD-10-CM | POA: Insufficient documentation

## 2015-09-23 DIAGNOSIS — F411 Generalized anxiety disorder: Secondary | ICD-10-CM | POA: Insufficient documentation

## 2015-09-23 DIAGNOSIS — Z79899 Other long term (current) drug therapy: Secondary | ICD-10-CM

## 2015-09-23 DIAGNOSIS — E538 Deficiency of other specified B group vitamins: Secondary | ICD-10-CM | POA: Insufficient documentation

## 2015-09-23 DIAGNOSIS — M79603 Pain in arm, unspecified: Secondary | ICD-10-CM

## 2015-09-23 DIAGNOSIS — F1190 Opioid use, unspecified, uncomplicated: Secondary | ICD-10-CM | POA: Insufficient documentation

## 2015-09-23 DIAGNOSIS — E559 Vitamin D deficiency, unspecified: Secondary | ICD-10-CM | POA: Insufficient documentation

## 2015-09-23 DIAGNOSIS — Z7189 Other specified counseling: Secondary | ICD-10-CM | POA: Insufficient documentation

## 2015-09-23 DIAGNOSIS — M79605 Pain in left leg: Secondary | ICD-10-CM | POA: Insufficient documentation

## 2015-09-23 DIAGNOSIS — J449 Chronic obstructive pulmonary disease, unspecified: Secondary | ICD-10-CM | POA: Insufficient documentation

## 2015-09-23 DIAGNOSIS — G894 Chronic pain syndrome: Secondary | ICD-10-CM | POA: Insufficient documentation

## 2015-09-23 DIAGNOSIS — M79602 Pain in left arm: Secondary | ICD-10-CM | POA: Insufficient documentation

## 2015-09-23 DIAGNOSIS — R700 Elevated erythrocyte sedimentation rate: Secondary | ICD-10-CM

## 2015-09-23 DIAGNOSIS — Z5181 Encounter for therapeutic drug level monitoring: Secondary | ICD-10-CM

## 2015-09-23 DIAGNOSIS — M79601 Pain in right arm: Secondary | ICD-10-CM | POA: Insufficient documentation

## 2015-09-23 MED ORDER — OXYCODONE HCL 5 MG PO TABS: 5.0000 mg | ORAL_TABLET | Freq: Four times a day (QID) | ORAL | Status: DC | PRN

## 2015-09-23 MED ORDER — ALPRAZOLAM 0.25 MG PO TABS: 0.2500 mg | ORAL_TABLET | Freq: Every evening | ORAL | Status: DC | PRN

## 2015-09-23 NOTE — Progress Notes (Signed)
Patient's Name: Elizabeth [name] MRN: [id number] DOB: 12-30-1960 DOS: 09/23/2015  Primary Reason(s) for Visit: Encounter for Medication Management CC: Hand Pain   HPI:   [name] is a 54 y.o. year old, female patient, who returns today as an established patient. She has Chronic low back pain; Generalized anxiety disorder; Encounter for therapeutic drug level monitoring; Long term current use of opiate analgesic; Uncomplicated opioid dependence (HCC); Opiate use; Chronic pain syndrome; Complex regional pain syndrome I of upper limb; Complex regional pain syndrome I of lower limb; Opiate analgesic use agreement exists; Presence of spinal cord stimulator; Chronic obstructive pulmonary disease (COPD) (HCC); Congestive heart failure (HCC); Vitamin D deficiency; Vitamin B12 deficiency; Chronic upper extremity pain (Bilateral); Chronic lower extremity pain (Bilateral); Long term prescription opiate use; Encounter for chronic pain management; Elevated sedimentation rate; and Chronic pain on her problem list.. Her primarily concern today is the Hand Pain     Elizabeth [name] comes in today with a terrible URTI. She comes in for pharmacological management of her chronic pain. She is not having any problems with any of her medications.  Today's Pain Score: 3  Reported level of pain is compatible with clinical observation Pain Type: Chronic pain Pain Location: Hand (when pt has a flare up of RSD- effects all extrem.) Pain Orientation: Right Pain Descriptors / Indicators: Aching, Constant Pain Frequency: Constant  Date of Last Visit: 07/25/15 Service Provided on Last Visit: Med Refill  Pharmacotherapy Review:   Side-effects or Adverse reactions: None reported Effectiveness: Described as relatively effective, allowing for increase in activities of daily living (ADL) Onset of action: Within expected pharmacological parameters Duration of action: Within normal limits for medication Peak effect: Timing and  results are as within normal expected parameters [place] PMP: Compliant with practice rules and regulations UDS Results:   UDS Interpretation: Patient appears to be compliant with practice rules and regulations Medication Assessment Form: Reviewed. Patient indicates being compliant with therapy Treatment compliance: Compliant Substance Use Disorder (SUD) Risk Level: Low Pharmacologic Plan: Continue therapy as is   Allergies: [name] is allergic to lisinopril.  Meds: The patient has a current medication list which includes the following prescription(s): albuterol, alprazolam, pramipexole, [id number], sodium-potassium bicarbonate, oxycodone, oxycodone, oxycodone, and pantoprazole. Requested Prescriptions   Signed Prescriptions Disp Refills  . oxyCODONE (OXY IR/ROXICODONE) 5 MG immediate release tablet 120 tablet 0    Sig: Take 1 tablet (5 mg total) by mouth every 6 (six) hours as needed for moderate pain or severe pain.  Marland Kitchen oxyCODONE (OXY IR/ROXICODONE) 5 MG immediate release tablet 120 tablet 0    Sig: Take 1 tablet (5 mg total) by mouth every 6 (six) hours as needed for moderate pain or severe pain.  Marland Kitchen oxyCODONE (OXY IR/ROXICODONE) 5 MG immediate release tablet 120 tablet 0    Sig: Take 1 tablet (5 mg total) by mouth every 6 (six) hours as needed for moderate pain or severe pain.  Marland Kitchen ALPRAZolam (XANAX) 0.25 MG tablet 30 tablet 2    Sig: Take 1 tablet (0.25 mg total) by mouth at bedtime as needed for anxiety.    ROS: Constitutional: Afebrile, no chills, well hydrated and well nourished Gastrointestinal: negative Musculoskeletal:negative Neurological: negative Behavioral/Psych: negative  PFSH: Medical:  [name]  has a past medical history of RSD (reflex sympathetic dystrophy); Spinal cord stimulator status; CHF (congestive heart failure) (HCC); Depression; Vitamin D deficiency; Vitamin B12 deficiency; Anxiety; Reflux; Congestive heart failure (HCC) (07/25/2015); Lower  extremity pain (07/25/2015); Arm pain (07/25/2015); and  COPD (chronic obstructive pulmonary disease) (HCC). Family: family history includes Cancer in her father; Cirrhosis in her mother. Surgical:  has past surgical history that includes Spinal cord stimulator implant. Tobacco:  reports that she has quit smoking. She does not have any smokeless tobacco history on file. Alcohol:  reports that she does not drink alcohol. Drug:  reports that she does not use illicit drugs.  Physical Exam: Vitals:  Today's Vitals   09/23/15 1325 09/23/15 1327  BP: 154/71   Pulse: 74   Temp: 98.3 F (36.8 C)   TempSrc: Oral   Resp: 20   Height:  (1.448 m)   Weight: 120 lb (54.432 kg)   SpO2: 98%   PainSc:  3   Calculated BMI: Body mass index is 25.96 kg/(m^2). General appearance: alert, cooperative, appears stated age and no distress Eyes: PERLA Respiratory: No evidence respiratory distress, no audible rales or ronchi and no use of accessory muscles of respiration Neck: no adenopathy, no carotid bruit, no JVD, supple, symmetrical, trachea midline and thyroid not enlarged, symmetric, no tenderness/mass/nodules  Cervical Spine ROM: Adequate for flexion, extension, rotation, and lateral bending Palpation: No palpable trigger points  Upper Extremities ROM: Adequate bilaterally Strength: 5/5 for all flexors and extensors of the upper extremity, bilaterally Pulses: Palpable bilaterally Neurologic: No allodynia, No hyperesthesia, No hyperpathia and No sensory abnormalities  Lumbar Spine ROM: Adequate for flexion, extension, rotation, and lateral bending Palpation: No palpable trigger points Lumbar Hyperextension and rotation: Non-contributory Patrick's Maneuver: Non-contributory  Lower Extremities ROM: Adequate bilaterally Strength: 5/5 for all flexors and extensors of the lower extremity, bilaterally Pulses: Palpable bilaterally Neurologic: No allodynia, No hyperesthesia, No hyperpathia, No  sensory abnormalities and No antalgic gait or posture  Assessment: Encounter Diagnosis:  Primary Diagnosis: Chronic pain syndrome [G89.4]  Plan: Interventional Therapies: None at this point.  Gurpreet was seen today for hand pain.  Diagnoses and all orders for this visit:  Chronic pain syndrome -     COMPLETE METABOLIC PANEL WITH GFR; Future -     C-reactive protein; Future -     Magnesium; Future  Chronic pain of both upper extremities -     oxyCODONE (OXY IR/ROXICODONE) 5 MG immediate release tablet; Take 1 tablet (5 mg total) by mouth every 6 (six) hours as needed for moderate pain or severe pain. -     oxyCODONE (OXY IR/ROXICODONE) 5 MG immediate release tablet; Take 1 tablet (5 mg total) by mouth every 6 (six) hours as needed for moderate pain or severe pain. -     oxyCODONE (OXY IR/ROXICODONE) 5 MG immediate release tablet; Take 1 tablet (5 mg total) by mouth every 6 (six) hours as needed for moderate pain or severe pain.  Chronic pain of lower extremity, unspecified laterality  Long term prescription opiate use  Encounter for chronic pain management  Elevated sedimentation rate -     C-reactive protein; Future -     Sedimentation rate; Future  Generalized anxiety disorder -     ALPRAZolam (XANAX) 0.25 MG tablet; Take 1 tablet (0.25 mg total) by mouth at bedtime as needed for anxiety.  Vitamin B12 deficiency -     B12 and Folate Panel; Future  Vitamin D deficiency -     Vitamin D pnl(25-hydrxy+1,25-dihy)-bld; Future  Uncomplicated opioid dependence (HCC)  Opiate use  Opiate analgesic use agreement exists  Long term current use of opiate analgesic -     Drugs of abuse screen w/o alc, rtn urine-sln  Encounter for  therapeutic drug level monitoring  Complex regional pain syndrome i of upper limb, bilateral  Complex regional pain syndrome i of lower limb, bilateral  Chronic pain     Patient Instructions        Medications discontinued today:   Medications Discontinued During This Encounter  Medication Reason  . oxycodone (OXY-IR) 5 MG capsule Completed Course  . oxyCODONE (OXY IR/ROXICODONE) 5 MG immediate release tablet Completed Course  . ALPRAZolam (XANAX) 0.25 MG tablet Reorder   Medications administered today:  Ms. Elizabeth MaskerMorris had no medications administered during this visit.  Primary Care Physician: No primary care provider on file. Location: [facility] Note by: Tempestt Silba A. Laban EmperorNaveira, M.D, DABA, DABAPM, DABPM, DABIPP, FIPP

## 2015-09-23 NOTE — Progress Notes (Signed)
Safety precautions to be maintained throughout the outpatient stay will include: orient to surroundings, keep bed in low position, maintain call bell within reach at all times, provide assistance with transfer out of bed and ambulation.  Pills remaining 1/120 oxycodone 5 mg filled 08/24/15 Pt has internet service but doesn't know how to work- asked pt to get family/friend to asst with logging onto [employer]- info given on AVS

## 2015-09-28 LAB — TOXASSURE SELECT 13 (MW), URINE: PDF: 0

## 2015-11-30 ENCOUNTER — Emergency Department: Payer: Self-pay

## 2015-11-30 ENCOUNTER — Emergency Department: Admission: EM | Admit: 2015-11-30 | Payer: Self-pay | Source: Home / Self Care

## 2015-11-30 ENCOUNTER — Encounter: Payer: Self-pay | Admitting: Emergency Medicine

## 2015-11-30 ENCOUNTER — Emergency Department
Admission: EM | Admit: 2015-11-30 | Discharge: 2015-11-30 | Disposition: A | Discharge status: Home / Self Care | Payer: Self-pay | Attending: Emergency Medicine | Admitting: Emergency Medicine

## 2015-11-30 DIAGNOSIS — Z79899 Other long term (current) drug therapy: Secondary | ICD-10-CM | POA: Insufficient documentation

## 2015-11-30 DIAGNOSIS — F17210 Nicotine dependence, cigarettes, uncomplicated: Secondary | ICD-10-CM | POA: Insufficient documentation

## 2015-11-30 DIAGNOSIS — J209 Acute bronchitis, unspecified: Secondary | ICD-10-CM

## 2015-11-30 DIAGNOSIS — J440 Chronic obstructive pulmonary disease with acute lower respiratory infection: Secondary | ICD-10-CM | POA: Insufficient documentation

## 2015-11-30 LAB — RAPID INFLUENZA A&B ANTIGENS
Influenza A (ARMC): NOT DETECTED
Influenza B (ARMC): NOT DETECTED

## 2015-11-30 MED ORDER — GUAIFENESIN-CODEINE 100-10 MG/5ML PO SOLN: 5.0000 mL | ORAL | Status: DC | PRN

## 2015-11-30 MED ORDER — ALBUTEROL SULFATE HFA 108 (90 BASE) MCG/ACT IN AERS: 2.0000 | INHALATION_SPRAY | Freq: Four times a day (QID) | RESPIRATORY_TRACT | Status: DC | PRN

## 2015-11-30 MED ORDER — PREDNISONE 20 MG PO TABS
30.0000 mg | ORAL_TABLET | Freq: Once | ORAL | Status: AC
  Administered 2015-11-30: 30 mg via ORAL
  Filled 2015-11-30: qty 1

## 2015-11-30 MED ORDER — PREDNISONE 10 MG PO TABS: ORAL_TABLET | ORAL | Status: DC

## 2015-11-30 MED ORDER — AZITHROMYCIN 250 MG PO TABS: ORAL_TABLET | ORAL | Status: DC

## 2015-11-30 MED ORDER — IPRATROPIUM-ALBUTEROL 0.5-2.5 (3) MG/3ML IN SOLN
3.0000 mL | Freq: Once | RESPIRATORY_TRACT | Status: AC
  Administered 2015-11-30: 3 mL via RESPIRATORY_TRACT
  Filled 2015-11-30: qty 3

## 2015-11-30 MED ORDER — ONDANSETRON 4 MG PO TBDP
4.0000 mg | ORAL_TABLET | Freq: Once | ORAL | Status: AC
  Administered 2015-11-30: 4 mg via ORAL
  Filled 2015-11-30: qty 1

## 2015-11-30 NOTE — ED Provider Notes (Signed)
[facility] Emergency Department Provider Note ____________________________________________  Time seen: Approximately 5:32 PM  I have reviewed the triage vital signs and the nursing notes.   HISTORY  Chief Complaint Cough  HPI Elizabeth [name] is a 55 y.o. female is here with complaint of cough, fever, and body aches since yesterday. Patient states that she has coughed a great deal and mostly nonproductive. She and her mother state that she has had wheezing since before she actually felt like she was sick. She denies any nausea, vomiting or diarrhea. Currently she complains of chills. Patient is a smoker and has had problems with bronchitis in the past. Currently she rates her discomfort as an 8 out of 10.   Past Medical History  Diagnosis Date  . RSD (reflex sympathetic dystrophy)   . Spinal cord stimulator status   . CHF (congestive heart failure) (HCC)   . Depression   . Vitamin D deficiency   . Vitamin B12 deficiency   . Anxiety   . Reflux   . Congestive heart failure (HCC) 07/25/2015  . Lower extremity pain 07/25/2015  . Arm pain 07/25/2015  . COPD (chronic obstructive pulmonary disease) (HCC)     never been idagnosed    Patient Active Problem List   Diagnosis Date Noted  . Chronic upper extremity pain (Bilateral) 09/23/2015  . Chronic lower extremity pain (Bilateral) 09/23/2015  . Long term prescription opiate use 09/23/2015  . Encounter for chronic pain management 09/23/2015  . Elevated sedimentation rate 09/23/2015  . Chronic pain 09/23/2015  . Chronic low back pain 07/25/2015  . Generalized anxiety disorder 07/25/2015  . Encounter for therapeutic drug level monitoring 07/25/2015  . Long term current use of opiate analgesic 07/25/2015  . Uncomplicated opioid dependence (HCC) 07/25/2015  . Opiate use 07/25/2015  . Chronic pain syndrome 07/25/2015  . Complex regional pain syndrome I of upper limb 07/25/2015  . Complex regional pain syndrome  I of lower limb 07/25/2015  . Opiate analgesic use agreement exists 07/25/2015  . Presence of spinal cord stimulator 07/25/2015  . Chronic obstructive pulmonary disease (COPD) (HCC) 07/25/2015  . Congestive heart failure (HCC) 07/25/2015  . Vitamin D deficiency 07/25/2015  . Vitamin B12 deficiency 07/25/2015    Past Surgical History  Procedure Laterality Date  . Spinal cord stimulator implant      x 2    Current Outpatient Rx  Name  Route  Sig  Dispense  Refill  . albuterol (PROVENTIL HFA;VENTOLIN HFA) 108 (90 Base) MCG/ACT inhaler   Inhalation   Inhale 2 puffs into the lungs every 6 (six) hours as needed for wheezing or shortness of breath.   1 Inhaler   2   . ALPRAZolam (XANAX) 0.25 MG tablet   Oral   Take 1 tablet (0.25 mg total) by mouth at bedtime as needed for anxiety.   30 tablet   2     Do not place this medication, or any other prescri ...   . azithromycin (ZITHROMAX Z-PAK) 250 MG tablet      Take 2 tablets (500 mg) on  Day 1,  followed by 1 tablet (250 mg) once daily on Days 2 through 5.   6 each   0   . guaiFENesin-codeine 100-10 MG/5ML syrup   Oral   Take 5 mLs by mouth every 4 (four) hours as needed for cough.   120 mL   0   . oxyCODONE (OXY IR/ROXICODONE) 5 MG immediate release tablet   Oral  Take 1 tablet (5 mg total) by mouth every 6 (six) hours as needed for moderate pain or severe pain.   120 tablet   0     Do not place this medication, or any other prescri ...   . oxyCODONE (OXY IR/ROXICODONE) 5 MG immediate release tablet   Oral   Take 1 tablet (5 mg total) by mouth every 6 (six) hours as needed for moderate pain or severe pain.   120 tablet   0     Do not place this medication, or any other prescri ...   . oxyCODONE (OXY IR/ROXICODONE) 5 MG immediate release tablet   Oral   Take 1 tablet (5 mg total) by mouth every 6 (six) hours as needed for moderate pain or severe pain.   120 tablet   0     Do not place this medication, or any  other prescri ...   . pantoprazole (PROTONIX) 40 MG tablet   Oral   Take 40 mg by mouth every 6 (six) hours as needed.         . pramipexole (MIRAPEX) 1.5 MG tablet   Oral   Take 1.5 mg by mouth 3 (three) times daily.         . predniSONE (DELTASONE) 10 MG tablet      Take 3 tablets once a day for 3 day beginning Sunday   9 tablet   0   . Pseudoeph-CPM-DM-APAP (THERAFLU FLU/CONGESTION MAX ST PO)   Oral   Take 1 application by mouth at bedtime as needed.         . sodium-potassium bicarbonate (ALKA-SELTZER GOLD) TBEF dissolvable tablet   Oral   Take 1 tablet by mouth daily as needed (TAKING FOR COLD--- ALKA-SELTZER SEVERE).           Allergies Lisinopril  Family History  Problem Relation Age of Onset  . Cirrhosis Mother   . Cancer Father     Social History Social History  Substance Use Topics  . Smoking status: Current Every Day Smoker -- 0.50 packs/day    Types: Cigarettes  . Smokeless tobacco: None     Comment: Quit 2 years ago  . Alcohol Use: No    Review of Systems Constitutional: Positive fever/chills Eyes: No visual changes. ENT: No sore throat. Cardiovascular: Denies chest pain. Respiratory: Denies shortness of breath. Positive wheezing, positive coughing Gastrointestinal:   No nausea, no vomiting.   Musculoskeletal: Negative for back pain. Skin: Negative for rash. Neurological: Negative for headaches, focal weakness or numbness.  10-point ROS otherwise negative.  ____________________________________________   PHYSICAL EXAM:  VITAL SIGNS: ED Triage Vitals  Enc Vitals Group     BP 11/30/15 1627 122/61 mmHg     Pulse Rate 11/30/15 1627 89     Resp 11/30/15 1627 20     Temp 11/30/15 1627 100.3 F (37.9 C)     Temp Source 11/30/15 1627 Oral     SpO2 11/30/15 1627 94 %     Weight 11/30/15 1627 126 lb (57.153 kg)     Height 11/30/15 1627 4\' 9"  (1.448 m)     Head Cir --      Peak Flow --      Pain Score 11/30/15 1629 8     Pain Loc  --      Pain Edu? --      Excl. in GC? --     Constitutional: Alert and oriented. Well appearing and in no acute distress. Eyes: Conjunctivae  are normal. PERRL. EOMI. Head: Atraumatic. Nose: No congestion/rhinnorhea. EACs and TMs are clear bilaterally. Mouth/Throat: Mucous membranes are moist.  Oropharynx non-erythematous. Neck: No stridor.   Hematological/Lymphatic/Immunilogical: No cervical lymphadenopathy. Cardiovascular: Normal rate, regular rhythm. Grossly normal heart sounds.  Good peripheral circulation. Respiratory: Normal respiratory effort.  No retractions. Lungs bilateral expiratory wheezes heard throughout. Musculoskeletal: No lower extremity tenderness nor edema.  No joint effusions. Neurologic:  Normal speech and language. No gross focal neurologic deficits are appreciated. No gait instability. Skin:  Skin is warm, dry and intact. No rash noted. Psychiatric: Mood and affect are normal. Speech and behavior are normal.  ____________________________________________   LABS (all labs ordered are listed, but only abnormal results are displayed)  Labs Reviewed  RAPID INFLUENZA A&B ANTIGENS ([facility] ONLY)    RADIOLOGY  Chest x-ray is negative for pneumonia. There is signs of chronic bronchitis. ____________________________________________   PROCEDURES  Procedure(s) performed: None  Critical Care performed: No  ____________________________________________   INITIAL IMPRESSION / ASSESSMENT AND PLAN / ED COURSE  Pertinent labs & imaging results that were available during my care of the patient were reviewed by me and considered in my medical decision making (see chart for details).  Patient was started on prednisone 30 mg while in the emergency room along with DuoNeb treatments. Patient is continue with her albuterol inhaler, start on Z-Pak, and Robitussin-AC as needed for cough. Patient is to follow-up with her primary care doctor if any continued  problems. ____________________________________________   FINAL CLINICAL IMPRESSION(S) / ED DIAGNOSES  Final diagnoses:  Cigarette smoker  Acute bronchitis, unspecified organism      Tommi Rumps, PA-C 11/30/15 2240  Jene Every, MD 11/30/15 2358

## 2015-11-30 NOTE — ED Notes (Signed)
Cough fever and body aches since yesterday

## 2015-12-16 ENCOUNTER — Ambulatory Visit: Payer: Self-pay | Attending: Pain Medicine | Admitting: Pain Medicine

## 2015-12-16 ENCOUNTER — Encounter: Payer: Self-pay | Admitting: Pain Medicine

## 2015-12-16 VITALS — BP 135/77 | HR 78 | Temp 98.9°F | Resp 16 | Ht <= 58 in | Wt 125.0 lb

## 2015-12-16 DIAGNOSIS — I509 Heart failure, unspecified: Secondary | ICD-10-CM | POA: Insufficient documentation

## 2015-12-16 DIAGNOSIS — M79601 Pain in right arm: Secondary | ICD-10-CM

## 2015-12-16 DIAGNOSIS — G4701 Insomnia due to medical condition: Secondary | ICD-10-CM

## 2015-12-16 DIAGNOSIS — M545 Low back pain: Secondary | ICD-10-CM | POA: Insufficient documentation

## 2015-12-16 DIAGNOSIS — E538 Deficiency of other specified B group vitamins: Secondary | ICD-10-CM | POA: Insufficient documentation

## 2015-12-16 DIAGNOSIS — Z87891 Personal history of nicotine dependence: Secondary | ICD-10-CM | POA: Insufficient documentation

## 2015-12-16 DIAGNOSIS — G8929 Other chronic pain: Secondary | ICD-10-CM | POA: Insufficient documentation

## 2015-12-16 DIAGNOSIS — Z79891 Long term (current) use of opiate analgesic: Secondary | ICD-10-CM | POA: Insufficient documentation

## 2015-12-16 DIAGNOSIS — F411 Generalized anxiety disorder: Secondary | ICD-10-CM | POA: Insufficient documentation

## 2015-12-16 DIAGNOSIS — M79602 Pain in left arm: Secondary | ICD-10-CM

## 2015-12-16 DIAGNOSIS — E559 Vitamin D deficiency, unspecified: Secondary | ICD-10-CM | POA: Insufficient documentation

## 2015-12-16 DIAGNOSIS — G4700 Insomnia, unspecified: Secondary | ICD-10-CM | POA: Insufficient documentation

## 2015-12-16 DIAGNOSIS — F1190 Opioid use, unspecified, uncomplicated: Secondary | ICD-10-CM

## 2015-12-16 DIAGNOSIS — G90513 Complex regional pain syndrome I of upper limb, bilateral: Secondary | ICD-10-CM | POA: Insufficient documentation

## 2015-12-16 DIAGNOSIS — F329 Major depressive disorder, single episode, unspecified: Secondary | ICD-10-CM | POA: Insufficient documentation

## 2015-12-16 DIAGNOSIS — J449 Chronic obstructive pulmonary disease, unspecified: Secondary | ICD-10-CM | POA: Insufficient documentation

## 2015-12-16 DIAGNOSIS — Z5181 Encounter for therapeutic drug level monitoring: Secondary | ICD-10-CM

## 2015-12-16 DIAGNOSIS — Z9689 Presence of other specified functional implants: Secondary | ICD-10-CM | POA: Insufficient documentation

## 2015-12-16 DIAGNOSIS — F419 Anxiety disorder, unspecified: Secondary | ICD-10-CM | POA: Insufficient documentation

## 2015-12-16 MED ORDER — OXYCODONE HCL 5 MG PO TABS: 5.0000 mg | ORAL_TABLET | Freq: Four times a day (QID) | ORAL | Status: DC | PRN

## 2015-12-16 MED ORDER — ALPRAZOLAM 0.25 MG PO TABS: 0.2500 mg | ORAL_TABLET | Freq: Every evening | ORAL | Status: DC | PRN

## 2015-12-16 MED ORDER — MELATONIN 10 MG PO CAPS: 20.0000 mg | ORAL_CAPSULE | Freq: Every evening | ORAL | Status: DC | PRN

## 2015-12-16 NOTE — Progress Notes (Signed)
Safety precautions to be maintained throughout the outpatient stay will include: orient to surroundings, keep bed in low position, maintain call bell within reach at all times, provide assistance with transfer out of bed and ambulation.  

## 2015-12-16 NOTE — Progress Notes (Signed)
Patient's Name: Elizabeth [name] MRN: [id number] DOB: 05-21-1961 DOS: 12/16/2015  Primary Reason(s) for Visit: Encounter for Medication Management CC: Arm Pain   HPI  Elizabeth [name] is a 55 y.o. year old, female patient, who returns today as an established patient. She has Chronic low back pain; Generalized anxiety disorder; Encounter for therapeutic drug level monitoring; Long term current use of opiate analgesic; Uncomplicated opioid dependence (HCC); Opiate use (30 MME/Day); Chronic pain syndrome; Complex regional pain syndrome I of upper limb; Complex regional pain syndrome I of lower limb; Opiate analgesic use agreement exists; Presence of spinal cord stimulator; Chronic obstructive pulmonary disease (COPD) (HCC); Congestive heart failure (HCC); Vitamin D deficiency; Vitamin B12 deficiency; Chronic upper extremity pain (Bilateral); Chronic lower extremity pain (Bilateral); Long term prescription opiate use; Encounter for chronic pain management; Elevated sedimentation rate; Chronic pain; and Insomnia secondary to chronic pain on her problem list.. Her primarily concern today is the Arm Pain   The patient comes into the clinic today for pharmacological management of her chronic pain. Over time we have decreased significantly the amount of opioids and benzodiazepines that she takes. Today I will send a prescription for melatonin so that she can try this and if it works for her we will discontinue the alprazolam. Since the patient left her husband and has gone back to her childhood boyfriend, she has been doing much better and she has not had as many flareups of her pain. If she does well with this, next visit we will not refill the alprazolam.  Pain Assessment: Self-Reported Pain Score: 3  Reported level is compatible with observation Pain Type: Chronic pain Pain Location: Arm Pain Orientation: Right Pain Descriptors / Indicators: Aching, Dull Pain Frequency: Constant  Date of Last Visit:  09/23/15 Service Provided on Last Visit: Med Refill  Controlled Substance Pharmacotherapy Assessment  Analgesic: Oxycodone IR 5 mg 1 tablet by mouth every 6 hours (20 mg/day) MME/day: 30 mg/day Pharmacokinetics: Onset of action (Liberation/Absorption): Within expected pharmacological parameters Time to Peak effect (Distribution): Timing and results are as within normal expected parameters Duration of action (Metabolism/Excretion): Within normal limits for medication Pharmacodynamics: Analgesic Effect: More than 50% Activity Facilitation: Medication(s) allow patient to sit, stand, walk, and do the basic ADLs Perceived Effectiveness: Described as relatively effective, allowing for increase in activities of daily living (ADL) Side-effects or Adverse reactions: None reported Monitoring: [place] PMP: Compliant with practice rules and regulations UDS Results/interpretation: The patient's last UDS was done on 09/23/2015 and it came back within normal limits with no unexpected results except for the absence of alprazolam which she had declared. The patient was informed of the CDC guidelines with regards to the use of opioids and benzodiazepines. Medication Assessment Form: Reviewed. Patient indicates being compliant with therapy Treatment compliance: Compliant Risk Assessment: Aberrant Behavior: None observed today Substance Use Disorder (SUD) Risk Level: Low Opioid Risk Tool (ORT) Score: Total Score: 0 Low Risk for SUD (Score <3) Depression Scale Score: PHQ-2: PHQ-2 Total Score: 0 PHQ-9: PHQ-9 Total Score: 0 Pharmacologic Plan: No change in therapy, at this time. The long-term plan is to discontinue the alprazolam. Today she will be given a prescription for melatonin and he if effective, we'll discontinue the alprazolam on her next visit.  Laboratory Workup  Last ED UDS: No results found for: THCU, COCAINSCRNUR, PCPSCRNUR, MDMA, AMPHETMU, METHADONE, ETOH  Inflammation Markers Lab Results   Component Value Date   ESRSEDRATE 37* 03/09/2014    Renal Function Lab Results  Component Value Date  BUN 10 03/08/2014   CREATININE 0.77 03/08/2014   GFRAA >60 03/08/2014   GFRNONAA >60 03/08/2014    Hepatic Function Lab Results  Component Value Date   AST 10* 03/08/2014   ALT 9* 03/08/2014   ALBUMIN 3.1* 03/08/2014    Electrolytes Lab Results  Component Value Date   NA 139 03/08/2014   K 4.0 03/08/2014   CL 103 03/08/2014   CALCIUM 8.9 03/08/2014   MG 2.2 03/09/2014    Allergies  Elizabeth [name] is allergic to lisinopril.  Meds  The patient has a current medication list which includes the following prescription(s): albuterol, alprazolam, guaifenesin-codeine, oxycodone, oxycodone, oxycodone, polyethylene glycol, pramipexole, and melatonin.  Current Outpatient Prescriptions on File Prior to Visit  Medication Sig  . albuterol (PROVENTIL HFA;VENTOLIN HFA) 108 (90 Base) MCG/ACT inhaler Inhale 2 puffs into the lungs every 6 (six) hours as needed for wheezing or shortness of breath.  . guaiFENesin-codeine 100-10 MG/5ML syrup Take 5 mLs by mouth every 4 (four) hours as needed for cough.  . pramipexole (MIRAPEX) 1.5 MG tablet Take 1.5 mg by mouth 3 (three) times daily.   No current facility-administered medications on file prior to visit.    ROS  Constitutional: Afebrile, no chills, well hydrated and well nourished Gastrointestinal: negative Musculoskeletal:negative Neurological: negative Behavioral/Psych: negative  PFSH  Medical:  Elizabeth [name]  has a past medical history of RSD (reflex sympathetic dystrophy); Spinal cord stimulator status; CHF (congestive heart failure) (HCC); Depression; Vitamin D deficiency; Vitamin B12 deficiency; Anxiety; Reflux; Congestive heart failure (HCC) (07/25/2015); Lower extremity pain (07/25/2015); Arm pain (07/25/2015); and COPD (chronic obstructive pulmonary disease) (HCC). Family: family history includes Cancer in her father; Cirrhosis  in her mother. Surgical:  has past surgical history that includes Spinal cord stimulator implant. Tobacco:  reports that she has quit smoking. Her smoking use included Cigarettes. She smoked 0.50 packs per day. She does not have any smokeless tobacco history on file. Alcohol:  reports that she does not drink alcohol. Drug:  reports that she does not use illicit drugs.  Physical Exam  Vitals:  Today's Vitals   12/16/15 0952 12/16/15 0954  BP:  135/77  Pulse: 78   Temp: 98.9 F (37.2 C)   Resp: 16   Height:  (1.448 m)   Weight: 125 lb (56.7 kg)   SpO2: 95%   PainSc: 3  3   PainLoc: Arm     Calculated BMI: Body mass index is 27.04 kg/(m^2).  General appearance: alert, cooperative, appears stated age and no distress Eyes: PERLA Respiratory: No evidence respiratory distress, no audible rales or ronchi and no use of accessory muscles of respiration  Cervical Spine Inspection: Normal anatomy Alignment: Symetrical ROM: Adequate  Upper Extremities Inspection: No gross anomalies detected ROM: Adequate Sensory: Normal Motor: Unremarkable  Thoracic Spine Inspection: No gross anomalies detected Alignment: Symetrical ROM: Adequate  Lumbar Spine Inspection: No gross anomalies detected Alignment: Symetrical ROM: Adequate  Gait: WNL  Lower Extremities Inspection: No gross anomalies detected ROM: Adequate Sensory:  Normal Motor: Unremarkable  Assessment & Plan  Primary Diagnosis & Pertinent Problem List: The primary encounter diagnosis was Chronic pain. Diagnoses of Encounter for therapeutic drug level monitoring, Long term current use of opiate analgesic, Complex regional pain syndrome i of upper limb, bilateral, Chronic pain of both upper extremities, Generalized anxiety disorder, Insomnia secondary to chronic pain, and Opiate use (30 MME/Day) were also pertinent to this visit.  Visit Diagnosis: 1. Chronic pain   2. Encounter for therapeutic  drug level monitoring    3. Long term current use of opiate analgesic   4. Complex regional pain syndrome i of upper limb, bilateral   5. Chronic pain of both upper extremities   6. Generalized anxiety disorder   7. Insomnia secondary to chronic pain   8. Opiate use (30 MME/Day)     Problem-specific Plan(s): No problem-specific assessment & plan notes found for this encounter.   Plan of Care  Pharmacotherapy (Medications Ordered): Meds ordered this encounter  Medications  . oxyCODONE (OXY IR/ROXICODONE) 5 MG immediate release tablet    Sig: Take 1 tablet (5 mg total) by mouth every 6 (six) hours as needed for moderate pain or severe pain.    Dispense:  120 tablet    Refill:  0    Do not place this medication, or any other prescription from our practice, on "Automatic Refill". Patient may have prescription filled one day early if pharmacy is closed on scheduled refill date. Do not fill until: 12/20/15 To last until: 01/19/16  . oxyCODONE (OXY IR/ROXICODONE) 5 MG immediate release tablet    Sig: Take 1 tablet (5 mg total) by mouth every 6 (six) hours as needed for moderate pain or severe pain.    Dispense:  120 tablet    Refill:  0    Do not place this medication, or any other prescription from our practice, on "Automatic Refill". Patient may have prescription filled one day early if pharmacy is closed on scheduled refill date. Do not fill until: 01/19/16 To last until: 02/18/16  . oxyCODONE (OXY IR/ROXICODONE) 5 MG immediate release tablet    Sig: Take 1 tablet (5 mg total) by mouth every 6 (six) hours as needed for moderate pain or severe pain.    Dispense:  120 tablet    Refill:  0    Do not place this medication, or any other prescription from our practice, on "Automatic Refill". Patient may have prescription filled one day early if pharmacy is closed on scheduled refill date. Do not fill until: 02/18/16 To last until: 03/19/16  . ALPRAZolam (XANAX) 0.25 MG tablet    Sig: Take 1 tablet (0.25 mg  total) by mouth at bedtime as needed for anxiety.    Dispense:  30 tablet    Refill:  2    Do not place this medication, or any other prescription from our practice, on "Automatic Refill". Patient may have prescription filled one day early if pharmacy is closed on scheduled refill date. Do not fill until: 09/24/15 To last until: 12/20/14  . Melatonin 10 MG CAPS    Sig: Take 20 mg by mouth at bedtime as needed.    Dispense:  60 capsule    Refill:  2    Do not place this medication, or any other prescription from our practice, on "Automatic Refill". Patient may have prescription filled one day early if pharmacy is closed on scheduled refill date.    Lab-work & Procedure Ordered: Orders Placed This Encounter  Procedures  . ToxASSURE Select 13 (MW), Urine    Volume: 30 ml(s). Minimum 3 ml of urine is needed. Document temperature of fresh sample. Indications: Long term (current) use of opiate analgesic (Z79.891)  . Comprehensive metabolic panel    Standing Status: Future     Number of Occurrences:      Standing Expiration Date: 01/15/2016    Order Specific Question:  Has the patient fasted?    Answer:  No  . C-reactive protein  Standing Status: Future     Number of Occurrences:      Standing Expiration Date: 01/15/2016  . Magnesium    Standing Status: Future     Number of Occurrences:      Standing Expiration Date: 01/15/2016  . Sedimentation rate    Standing Status: Future     Number of Occurrences:      Standing Expiration Date: 01/15/2016  . Vitamin B12    Indication: Bone Pain (M89.9)    Standing Status: Future     Number of Occurrences:      Standing Expiration Date: 01/15/2016  . Vitamin D pnl(25-hydrxy+1,25-dihy)-bld    Standing Status: Future     Number of Occurrences:      Standing Expiration Date: 01/15/2016    Imaging Ordered: None  Interventional Therapies: Scheduled: None at this time. PRN Procedures: None at this time.    Referral(s) or Consult(s): None at this  time.  Medications administered during this visit: Elizabeth [name] had no medications administered during this visit.  Future Appointments Date Time Provider Department Center  03/18/2016 10:40 AM Delano Metz, MD [facility] None    Primary Care Physician: No PCP Per Patient Location: [facility] Outpatient Pain Management Facility Note by: Sydnee Levans. Laban Emperor, M.D, DABA, DABAPM, DABPM, DABIPP, FIPP  Pain Score Disclaimer: We use the NRS-11 scale. This is a self-reported, subjective measurement of pain severity with only modest accuracy. It is used primarily to identify changes within a particular patient. It must be understood that outpatient pain scales are significantly less accurate that those used for research, where they can be applied under ideal controlled circumstances with minimal exposure to variables. In reality, the score is likely to be a combination of pain intensity and pain affect, where pain affect describes the degree of emotional arousal or changes in action readiness caused by the sensory experience of pain. Factors such as social and work situation, setting, emotional state, anxiety levels, expectation, and prior pain experience may influence pain perception and show large inter-individual differences that may also be affected by time variables.

## 2015-12-17 ENCOUNTER — Telehealth: Payer: Self-pay | Admitting: Pain Medicine

## 2015-12-17 ENCOUNTER — Other Ambulatory Visit: Payer: Self-pay

## 2015-12-17 DIAGNOSIS — F411 Generalized anxiety disorder: Secondary | ICD-10-CM

## 2015-12-17 NOTE — Telephone Encounter (Signed)
Scripts given to patient yesterday are dated for Dec 2016, please call when she can come pick up new scripts

## 2015-12-17 NOTE — Telephone Encounter (Signed)
Patient notified to bring back old prescriptiion and to pick up new one.

## 2015-12-18 ENCOUNTER — Other Ambulatory Visit: Payer: Self-pay | Admitting: Pain Medicine

## 2015-12-18 DIAGNOSIS — F411 Generalized anxiety disorder: Secondary | ICD-10-CM

## 2015-12-18 DIAGNOSIS — G8929 Other chronic pain: Secondary | ICD-10-CM

## 2015-12-18 MED ORDER — ALPRAZOLAM 0.25 MG PO TABS: 0.2500 mg | ORAL_TABLET | Freq: Every evening | ORAL | Status: DC | PRN

## 2015-12-21 LAB — TOXASSURE SELECT 13 (MW), URINE: PDF: 0

## 2015-12-23 ENCOUNTER — Telehealth: Payer: Self-pay

## 2015-12-23 NOTE — Telephone Encounter (Signed)
Pt wants Dr. Laban EmperorNaveira to write a more in depth letter to Disability. Pt says this is required.

## 2015-12-26 NOTE — Telephone Encounter (Signed)
Please see if he can find in the old letters in her chart that we can use for this.

## 2016-02-13 ENCOUNTER — Telehealth: Payer: Self-pay | Admitting: Pain Medicine

## 2016-02-13 ENCOUNTER — Telehealth: Payer: Self-pay

## 2016-02-13 NOTE — Telephone Encounter (Signed)
Informed patient that we cant locate disability papers.  States she will bring by papers again.

## 2016-02-13 NOTE — Telephone Encounter (Signed)
Pt wants to know did Dr. Laban EmperorNaveira send her disability papers back

## 2016-02-13 NOTE — Telephone Encounter (Signed)
Patient would like to know if the papers she left here are completed ? Please let her know [phone]

## 2016-03-10 ENCOUNTER — Telehealth: Payer: Self-pay | Admitting: *Deleted

## 2016-03-10 NOTE — Telephone Encounter (Signed)
sw pt gave appt for 03/18/16@ 10:30am in which she called to verify.Marland Kitchen.Marland Kitchen.TD

## 2016-03-18 ENCOUNTER — Encounter: Payer: Self-pay | Admitting: Pain Medicine

## 2016-03-18 ENCOUNTER — Ambulatory Visit: Payer: Self-pay | Attending: Pain Medicine | Admitting: Pain Medicine

## 2016-03-18 ENCOUNTER — Other Ambulatory Visit
Admission: RE | Admit: 2016-03-18 | Discharge: 2016-03-18 | Disposition: A | Discharge status: Home / Self Care | Payer: Self-pay | Source: Ambulatory Visit | Attending: Pain Medicine | Admitting: Pain Medicine

## 2016-03-18 VITALS — BP 136/86 | HR 78 | Temp 98.5°F | Resp 16 | Ht <= 58 in | Wt 125.0 lb

## 2016-03-18 DIAGNOSIS — G8929 Other chronic pain: Secondary | ICD-10-CM

## 2016-03-18 DIAGNOSIS — Z79891 Long term (current) use of opiate analgesic: Secondary | ICD-10-CM

## 2016-03-18 DIAGNOSIS — Z9689 Presence of other specified functional implants: Secondary | ICD-10-CM | POA: Insufficient documentation

## 2016-03-18 DIAGNOSIS — I509 Heart failure, unspecified: Secondary | ICD-10-CM | POA: Insufficient documentation

## 2016-03-18 DIAGNOSIS — M79606 Pain in leg, unspecified: Secondary | ICD-10-CM

## 2016-03-18 DIAGNOSIS — M545 Low back pain: Secondary | ICD-10-CM

## 2016-03-18 DIAGNOSIS — E538 Deficiency of other specified B group vitamins: Secondary | ICD-10-CM | POA: Insufficient documentation

## 2016-03-18 DIAGNOSIS — F1190 Opioid use, unspecified, uncomplicated: Secondary | ICD-10-CM

## 2016-03-18 DIAGNOSIS — M47816 Spondylosis without myelopathy or radiculopathy, lumbar region: Secondary | ICD-10-CM

## 2016-03-18 DIAGNOSIS — E559 Vitamin D deficiency, unspecified: Secondary | ICD-10-CM

## 2016-03-18 DIAGNOSIS — R700 Elevated erythrocyte sedimentation rate: Secondary | ICD-10-CM | POA: Insufficient documentation

## 2016-03-18 DIAGNOSIS — G9050 Complex regional pain syndrome I, unspecified: Secondary | ICD-10-CM | POA: Insufficient documentation

## 2016-03-18 DIAGNOSIS — G90511 Complex regional pain syndrome I of right upper limb: Secondary | ICD-10-CM

## 2016-03-18 DIAGNOSIS — Z87891 Personal history of nicotine dependence: Secondary | ICD-10-CM | POA: Insufficient documentation

## 2016-03-18 DIAGNOSIS — Z7982 Long term (current) use of aspirin: Secondary | ICD-10-CM | POA: Insufficient documentation

## 2016-03-18 DIAGNOSIS — G90521 Complex regional pain syndrome I of right lower limb: Secondary | ICD-10-CM

## 2016-03-18 DIAGNOSIS — F411 Generalized anxiety disorder: Secondary | ICD-10-CM

## 2016-03-18 DIAGNOSIS — G4700 Insomnia, unspecified: Secondary | ICD-10-CM | POA: Insufficient documentation

## 2016-03-18 DIAGNOSIS — J449 Chronic obstructive pulmonary disease, unspecified: Secondary | ICD-10-CM | POA: Insufficient documentation

## 2016-03-18 DIAGNOSIS — G894 Chronic pain syndrome: Secondary | ICD-10-CM | POA: Insufficient documentation

## 2016-03-18 DIAGNOSIS — M79601 Pain in right arm: Secondary | ICD-10-CM

## 2016-03-18 DIAGNOSIS — Z969 Presence of functional implant, unspecified: Secondary | ICD-10-CM

## 2016-03-18 DIAGNOSIS — Z5181 Encounter for therapeutic drug level monitoring: Secondary | ICD-10-CM

## 2016-03-18 DIAGNOSIS — M79602 Pain in left arm: Secondary | ICD-10-CM

## 2016-03-18 DIAGNOSIS — G4701 Insomnia due to medical condition: Secondary | ICD-10-CM

## 2016-03-18 LAB — COMPREHENSIVE METABOLIC PANEL
ALT: 12 U/L — ABNORMAL LOW (ref 14–54)
AST: 18 U/L (ref 15–41)
Albumin: 4.1 g/dL (ref 3.5–5.0)
Alkaline Phosphatase: 97 U/L (ref 38–126)
Anion gap: 7 (ref 5–15)
BUN: 14 mg/dL (ref 6–20)
CHLORIDE: 109 mmol/L (ref 101–111)
CO2: 23 mmol/L (ref 22–32)
Calcium: 9 mg/dL (ref 8.9–10.3)
Creatinine, Ser: 0.71 mg/dL (ref 0.44–1.00)
Glucose, Bld: 98 mg/dL (ref 65–99)
POTASSIUM: 3.8 mmol/L (ref 3.5–5.1)
Sodium: 139 mmol/L (ref 135–145)
TOTAL PROTEIN: 7.2 g/dL (ref 6.5–8.1)

## 2016-03-18 LAB — SEDIMENTATION RATE: SED RATE: 26 mm/h (ref 0–30)

## 2016-03-18 LAB — VITAMIN B12: VITAMIN B 12: 212 pg/mL (ref 180–914)

## 2016-03-18 LAB — C-REACTIVE PROTEIN: CRP: 1.2 mg/dL — AB (ref <1.0)

## 2016-03-18 LAB — MAGNESIUM: MAGNESIUM: 2.2 mg/dL (ref 1.7–2.4)

## 2016-03-18 MED ORDER — OXYCODONE HCL 5 MG PO TABS: 5.0000 mg | ORAL_TABLET | Freq: Four times a day (QID) | ORAL | Status: DC | PRN

## 2016-03-18 MED ORDER — MELATONIN 10 MG PO CAPS: 20.0000 mg | ORAL_CAPSULE | Freq: Every evening | ORAL | Status: DC | PRN

## 2016-03-18 MED ORDER — ALPRAZOLAM 0.25 MG PO TABS: 0.2500 mg | ORAL_TABLET | Freq: Every evening | ORAL | Status: DC | PRN

## 2016-03-18 NOTE — Progress Notes (Signed)
Patient's Name: Elizabeth [name]  Patient type: Established  MRN: [id number]  Service setting: Ambulatory outpatient  DOB: 03-25-1961  Location: [facility]  DOS: 03/18/2016  Primary Care Physician: No PCP Per Patient  Note by: Para March A. Laban Emperor, M.D, DABA, DABAPM, DABPM, DABIPP, FIPP  Referring Physician: No ref. provider found  Specialty: Board-Certified Interventional Pain Management  Last Visit to Pain Management: 03/10/2016   Primary Reason(s) for Visit: Encounter for prescription drug management (Level of risk: moderate) CC: Hand Pain   HPI  Elizabeth [name] is a 55 y.o. year old, female patient, who returns today as an established patient. She has Chronic low back pain; Generalized anxiety disorder; Encounter for therapeutic drug level monitoring; Long term current use of opiate analgesic; Uncomplicated opioid dependence (HCC); Opiate use (30 MME/Day); Chronic pain syndrome; Complex regional pain syndrome I of upper limb (Bilateral); Complex regional pain syndrome I of lower limb (Bilateral); Opiate analgesic use agreement exists; Presence of spinal cord stimulator; Chronic obstructive pulmonary disease (COPD) (HCC); Congestive heart failure (HCC); Vitamin D deficiency; Vitamin B12 deficiency; Chronic upper extremity pain (Bilateral); Chronic lower extremity pain (Bilateral); Long term prescription opiate use; Encounter for chronic pain management; Elevated sedimentation rate; Chronic pain; Insomnia secondary to chronic pain; and Lumbar facet syndrome (Bilateral) on her problem list.. Her primarily concern today is the Hand Pain   Pain Assessment: Self-Reported Pain Score: 3  Reported level is compatible with observation Pain Type: Chronic pain Pain Location: Hand Pain Orientation: Right Pain Descriptors / Indicators: Aching Pain Frequency: Constant  The patient comes into the clinics today for pharmacological management of her chronic pain. I last saw this patient  on 02/13/2016. The patient  reports that she does not use illicit drugs. Her body mass index is 27.04 kg/(m^2).  Date of Last Visit: 12/16/15 Service Provided on Last Visit: Med Refill  Controlled Substance Pharmacotherapy Assessment & REMS (Risk Evaluation and Mitigation Strategy)  Analgesic: Oxycodone IR 5 mg 1 tablet by mouth every 6 hours (20 mg/day) Pill Count: Did not bring pill bottles for count. Reminded to bring to all appointments. MME/day: 30 mg/dayPharmacokinetics: Onset of action (Liberation/Absorption): Within expected pharmacological parameters Time to Peak effect (Distribution): Timing and results are as within normal expected parameters Duration of action (Metabolism/Excretion): Within normal limits for medication Pharmacodynamics: Analgesic Effect: More than 50% Activity Facilitation: Medication(s) allow patient to sit, stand, walk, and do the basic ADLs Perceived Effectiveness: Described as relatively effective, allowing for increase in activities of daily living (ADL) Side-effects or Adverse reactions: None reported Monitoring: [place] PMP: Online review of the past 12-month period conducted. Compliant with practice rules and regulations Last UDS on record: TOXASSURE SELECT 13  Date Value Ref Range Status  12/16/2015 FINAL  Final    Comment:    ==================================================================== TOXASSURE SELECT 13 (MW) ==================================================================== Specimen Alert Note:  Urinary creatinine is low; ability to detect some drugs may be compromised.  Interpret results with caution. ==================================================================== Test                             Result       Flag       Units Drug Present and Declared for Prescription Verification   Alprazolam                     294          EXPECTED   ng/mg creat    Source  of alprazolam is a scheduled prescription medication.   Oxycodone                       1341         EXPECTED   ng/mg creat   Oxymorphone                    1659         EXPECTED   ng/mg creat   Noroxycodone                   2629         EXPECTED   ng/mg creat   Noroxymorphone                 382          EXPECTED   ng/mg creat    Sources of oxycodone are scheduled prescription medications.    Oxymorphone, noroxycodone, and noroxymorphone are expected    metabolites of oxycodone. Oxymorphone is also available as a    scheduled prescription medication. Drug Absent but Declared for Prescription Verification   Codeine                        Not Detected UNEXPECTED ng/mg creat ==================================================================== Test                      Result    Flag   Units      Ref Range   Creatinine              17        L      mg/dL      >=40 ==================================================================== Declared Medications:  The flagging and interpretation on this report are based on the  following declared medications.  Unexpected results may arise from  inaccuracies in the declared medications.  **Note: The testing scope of this panel includes these medications:  Alprazolam (Xanax)  Codeine  Oxycodone (Oxy-IR)  Oxycodone (Roxicodone)  **Note: The testing scope of this panel does not include following  reported medications:  Acetaminophen  Albuterol (Proventil)  Aspirin (Alka Seltzer)  Azithromycin (Zithromax)  Dextromethorphan  Fexofenadine (Allegra)  Guaifenesin  Pantoprazole (Protonix)  Polyethylene Glycol (MiraLAX)  Pramipexole (Mirapex)  Prednisone (Deltasone)  Pseudoephedrine ==================================================================== For clinical consultation, please call [phone]. ====================================================================    UDS interpretation: Compliant Medication Assessment Form: Reviewed. Patient indicates being compliant with therapy Treatment compliance:  Compliant Risk Assessment: Aberrant Behavior: None observed today Substance Use Disorder (SUD) Risk Level: Moderate Risk of opioid abuse or dependence: 0.7-3.0% with doses ? 36 MME/day and 6.1-26% with doses ? 120 MME/day. Opioid Risk Tool (ORT) Score: Total Score: 0 Low Risk for SUD (Score <3) Depression Scale Score: PHQ-2: PHQ-2 Total Score: 0 No depression (0) PHQ-9: PHQ-9 Total Score: 0 No depression (0-4)  Pharmacologic Plan: No change in therapy, at this time  Laboratory Chemistry  Inflammation Markers Lab Results  Component Value Date   ESRSEDRATE 26 03/18/2016    Renal Function Lab Results  Component Value Date   BUN 14 03/18/2016   CREATININE 0.71 03/18/2016   GFRAA >60 03/18/2016   GFRNONAA >60 03/18/2016    Hepatic Function Lab Results  Component Value Date   AST 18 03/18/2016   ALT 12* 03/18/2016   ALBUMIN 4.1 03/18/2016    Electrolytes Lab Results  Component Value Date   NA 139 03/18/2016   K  3.8 03/18/2016   CL 109 03/18/2016   CALCIUM 9.0 03/18/2016   MG 2.2 03/18/2016    Pain Modulating Vitamins No results found for: VD25OH, [id number], NF6213YQ6, VH8469GE9, VITAMINB12  Coagulation Parameters Lab Results  Component Value Date   PLT 214 03/08/2014    Note: Labs reviewed. Results made available to patient  Recent Diagnostic Imaging  Dg Chest 2 View  11/30/2015  CLINICAL DATA:  Wheezing for the past week. Cough. Chest congestion. Fever for the past 2 days. Smoker. EXAM: CHEST  2 VIEW COMPARISON:  04/11/2010. FINDINGS: Normal sized heart. Clear lungs. Stable central peribronchial thickening. Mild thoracic spine degenerative changes. Neural stimulator in the lower cervical spine. IMPRESSION: Stable mild chronic bronchitic changes.  No acute abnormality. Electronically Signed   By: Beckie Salts M.D.   On: 11/30/2015 18:13   Cervical Imaging: Cervical CT w contrast:  Results for orders placed in visit on 02/19/03  CT Cervical Spine W Contrast    Narrative FINDINGS CLINICAL DATA:    LEFT SCAPULAR REGION PAIN.  SPINAL CORD STIMULATOR. CERVICAL MYELOGRAM: LUMBAR PUNCTURE WAS PERFORMED FROM A LEFT SIDED APPROACH TO MIDLINE AT L4-5 USING A 22 GAUGE SPINAL NEEDLE.  8 CC OF OMNIPAQUE 300 WERE INSTILLED AND DIRECTED INTO THE THORACIC/CERVICAL REGION BY PATIENT POSITIONING.  THE SPINAL CANAL IS WIDELY PATENT.  THERE IS NO ANTERIOR EXTRADURAL DEFECT. THE SPINAL CORD APPEARS NORMAL.  THE ROOT SLEEVES FILL NORMALLY.  THE PATIENT HAS A SPINAL CORD STIMULATOR IN PLACE WHICH EXTENDS FROM THE LEFT/MIDLINE OVER TO THE RIGHT SIDE WITH MARKERS FROM C6 TO T1. IMPRESSION: NORMAL CERVICAL MYELOGRAM WITH A SPINAL CORD STIMULATOR IN PLACE AS DESCRIBED. UPPER THORACIC MYELOGRAM: THE PATIENT WAS STUDIED FROM T1 DOWN TO T7.  THE SPINAL CANAL IS WIDELY PATENT.  NO ANTERIOR EXTRADURAL DEFECTS.  THE SPINAL CORD SHADOW IS NORMAL. IMPRESSION: NORMAL THORACIC MYELOGRAM. POST MYELOGRAM CT SCAN: SPIRAL SCANNING IS PERFORMED FROM C2 TO T7. CERVICAL REGION IS NORMAL WITHOUT EVIDENCE OF DISK PATHOLOGY.  THE SPINAL CANAL AND NEURAL FORAMINA ARE ALL WIDELY PATENT.  THE STIMULATOR DEVISE CAN BE SEEN DORSALLY EXTENDING OVER TO THE RIGHT SIDE OF THE MIDLINE GOING FROM T6 DOWN TO T1. IMPRESSION: NORMAL CERVICAL MYELOGRAM WITH A SPINAL CORD STIMULATOR PRESENT. THORACIC POST MYELOGRAM CT: SPIRAL SCANNING IS PERFORMED FROM T1 TO T7.  ALIGNMENT IS NORMAL.  THERE IS NO EVIDENCE OF DISK PATHOLOGY.  THERE ARE SOME INSIGNIFICANT ANTERIOR OSTEOPHYTES.  THE SPINAL CANAL IS WIDELY PATENT WITH AMPLE SUBARACHNOID SPACE SURROUNDING THE CORD.  NO FORAMINAL LESION IS SEEN.  NO SIGN OF FACET DISEASE. IMPRESSION NEGATIVE THORACIC POST MYELOGRAM CT FROM T1 TO T7.   Cervical DG 2-3 views:  Results for orders placed in visit on 08/19/05  DG Cervical Spine 2 or 3 views   Narrative * PRIOR REPORT IMPORTED FROM AN EXTERNAL SYSTEM *   PRIOR REPORT IMPORTED FROM THE [employer] WORKFLOW SYSTEM    REASON FOR EXAM:   Please evaluate position of spinal cord  stimulator(telephone results to physician)  COMMENTS:   PROCEDURE:     DXR - DXR C- SPINE AP AND LATERAL  - Aug 19 2005 11:44AM   RESULT:      AP and lateral views of the cervical spine were obtained.   The  tip of a stimulator electrode is noted to be at the level of the C6  cervical  vertebral body.  The vertebral body heights and the intervertebral disc  spaces are well maintained.   IMPRESSION:  The tip of the stimulator electrode is noted to be at  the  level of the C6 cervical vertebral body.   Thank you for this opportunity to contribute to the care of your patient.       Cervical DG Myelogram views:  Results for orders placed in visit on 02/19/03  DG Myelogram Cervical   Narrative FINDINGS CLINICAL DATA:    LEFT SCAPULAR REGION PAIN.  SPINAL CORD STIMULATOR. CERVICAL MYELOGRAM: LUMBAR PUNCTURE WAS PERFORMED FROM A LEFT SIDED APPROACH TO MIDLINE AT L4-5 USING A 22 GAUGE SPINAL NEEDLE.  8 CC OF OMNIPAQUE 300 WERE INSTILLED AND DIRECTED INTO THE THORACIC/CERVICAL REGION BY PATIENT POSITIONING.  THE SPINAL CANAL IS WIDELY PATENT.  THERE IS NO ANTERIOR EXTRADURAL DEFECT. THE SPINAL CORD APPEARS NORMAL.  THE ROOT SLEEVES FILL NORMALLY.  THE PATIENT HAS A SPINAL CORD STIMULATOR IN PLACE WHICH EXTENDS FROM THE LEFT/MIDLINE OVER TO THE RIGHT SIDE WITH MARKERS FROM C6 TO T1. IMPRESSION: NORMAL CERVICAL MYELOGRAM WITH A SPINAL CORD STIMULATOR IN PLACE AS DESCRIBED. UPPER THORACIC MYELOGRAM: THE PATIENT WAS STUDIED FROM T1 DOWN TO T7.  THE SPINAL CANAL IS WIDELY PATENT.  NO ANTERIOR EXTRADURAL DEFECTS.  THE SPINAL CORD SHADOW IS NORMAL. IMPRESSION: NORMAL THORACIC MYELOGRAM. POST MYELOGRAM CT SCAN: SPIRAL SCANNING IS PERFORMED FROM C2 TO T7. CERVICAL REGION IS NORMAL WITHOUT EVIDENCE OF DISK PATHOLOGY.  THE SPINAL CANAL AND NEURAL FORAMINA ARE ALL WIDELY PATENT.  THE STIMULATOR DEVISE CAN BE SEEN DORSALLY  EXTENDING OVER TO THE RIGHT SIDE OF THE MIDLINE GOING FROM T6 DOWN TO T1. IMPRESSION: NORMAL CERVICAL MYELOGRAM WITH A SPINAL CORD STIMULATOR PRESENT. THORACIC POST MYELOGRAM CT: SPIRAL SCANNING IS PERFORMED FROM T1 TO T7.  ALIGNMENT IS NORMAL.  THERE IS NO EVIDENCE OF DISK PATHOLOGY.  THERE ARE SOME INSIGNIFICANT ANTERIOR OSTEOPHYTES.  THE SPINAL CANAL IS WIDELY PATENT WITH AMPLE SUBARACHNOID SPACE SURROUNDING THE CORD.  NO FORAMINAL LESION IS SEEN.  NO SIGN OF FACET DISEASE. IMPRESSION NEGATIVE THORACIC POST MYELOGRAM CT FROM T1 TO T7.   Thoracic Imaging: Thoracic CT w/wo contrast:  Results for orders placed in visit on 04/17/05  CT T Spine Ltd Wo Or W/ Cm   Narrative * PRIOR REPORT IMPORTED FROM AN EXTERNAL SYSTEM *   PRIOR REPORT IMPORTED FROM THE [employer] WORKFLOW SYSTEM   REASON FOR EXAM:  RSD LEFT UPPER EXT  COMMENTS:   PROCEDURE:     CT  - CT THORACIC SPINE WO  - Apr 17 2005  8:33AM   RESULT:       The patient has unexplained symptoms of reflex sympathetic  dystrophy.   The images reveal the presence of a needle entering the soft tissues of  the  paravertebral region on the LEFT via posterior approach for a nerve block.  I do not see evidence of a pneumothorax on the lung windows submitted.  No  pleural fluid collections are seen where demonstrated either.   IMPRESSION:   The patient has undergone nerve block on the LEFT in the paravertebral  region of the upper thoracic region.  Further interpretation is deferred  to  Dr. Laban EmperorNaveira.       Thoracic CT w contrast:  Results for orders placed in visit on 02/19/03  CT Thoracic Spine W Contrast   Narrative FINDINGS CLINICAL DATA:    LEFT SCAPULAR REGION PAIN.  SPINAL CORD STIMULATOR. CERVICAL MYELOGRAM: LUMBAR PUNCTURE WAS PERFORMED FROM A LEFT SIDED APPROACH TO MIDLINE AT L4-5 USING A 22 GAUGE SPINAL NEEDLE.  8 CC OF OMNIPAQUE 300 WERE INSTILLED AND DIRECTED INTO THE THORACIC/CERVICAL REGION BY PATIENT POSITIONING.   THE SPINAL CANAL IS WIDELY PATENT.  THERE IS NO ANTERIOR EXTRADURAL DEFECT. THE SPINAL CORD APPEARS NORMAL.  THE ROOT SLEEVES FILL NORMALLY.  THE PATIENT HAS A SPINAL CORD STIMULATOR IN PLACE WHICH EXTENDS FROM THE LEFT/MIDLINE OVER TO THE RIGHT SIDE WITH MARKERS FROM C6 TO T1. IMPRESSION: NORMAL CERVICAL MYELOGRAM WITH A SPINAL CORD STIMULATOR IN PLACE AS DESCRIBED. UPPER THORACIC MYELOGRAM: THE PATIENT WAS STUDIED FROM T1 DOWN TO T7.  THE SPINAL CANAL IS WIDELY PATENT.  NO ANTERIOR EXTRADURAL DEFECTS.  THE SPINAL CORD SHADOW IS NORMAL. IMPRESSION: NORMAL THORACIC MYELOGRAM. POST MYELOGRAM CT SCAN: SPIRAL SCANNING IS PERFORMED FROM C2 TO T7. CERVICAL REGION IS NORMAL WITHOUT EVIDENCE OF DISK PATHOLOGY.  THE SPINAL CANAL AND NEURAL FORAMINA ARE ALL WIDELY PATENT.  THE STIMULATOR DEVISE CAN BE SEEN DORSALLY EXTENDING OVER TO THE RIGHT SIDE OF THE MIDLINE GOING FROM T6 DOWN TO T1. IMPRESSION: NORMAL CERVICAL MYELOGRAM WITH A SPINAL CORD STIMULATOR PRESENT. THORACIC POST MYELOGRAM CT: SPIRAL SCANNING IS PERFORMED FROM T1 TO T7.  ALIGNMENT IS NORMAL.  THERE IS NO EVIDENCE OF DISK PATHOLOGY.  THERE ARE SOME INSIGNIFICANT ANTERIOR OSTEOPHYTES.  THE SPINAL CANAL IS WIDELY PATENT WITH AMPLE SUBARACHNOID SPACE SURROUNDING THE CORD.  NO FORAMINAL LESION IS SEEN.  NO SIGN OF FACET DISEASE. IMPRESSION NEGATIVE THORACIC POST MYELOGRAM CT FROM T1 TO T7.   Thoracic DG 2-3 views:  Results for orders placed in visit on 08/19/05  DG Thoracic Spine 2 View   Narrative * PRIOR REPORT IMPORTED FROM AN EXTERNAL SYSTEM *   PRIOR REPORT IMPORTED FROM THE [employer] WORKFLOW SYSTEM   REASON FOR EXAM:  Please evaluate position of spinal cord  Telephone results to physician and send films  COMMENTS:   PROCEDURE:     DXR - DXR THORACIC  AP AND LATERAL  - Aug 19 2005 11:44AM   RESULT:     AP and lateral views of the thoracic spine show the vertebral  body heights and intervertebral disc spaces to be well  maintained. The  vertebral body alignment is normal. A stimulator electrode is visualized.  The tip is located superiorly at approximately the level of the C6  cervical  vertebral body.   IMPRESSION:     No significant osseous abnormalities of the thoracic spine  are identified.   Thank you for this opportunity to contribute to the care of your patient.       Thoracic DG Myelogram views:  Results for orders placed in visit on 02/19/03  DG Myelogram Thoracic   Narrative FINDINGS CLINICAL DATA:    LEFT SCAPULAR REGION PAIN.  SPINAL CORD STIMULATOR. CERVICAL MYELOGRAM: LUMBAR PUNCTURE WAS PERFORMED FROM A LEFT SIDED APPROACH TO MIDLINE AT L4-5 USING A 22 GAUGE SPINAL NEEDLE.  8 CC OF OMNIPAQUE 300 WERE INSTILLED AND DIRECTED INTO THE THORACIC/CERVICAL REGION BY PATIENT POSITIONING.  THE SPINAL CANAL IS WIDELY PATENT.  THERE IS NO ANTERIOR EXTRADURAL DEFECT. THE SPINAL CORD APPEARS NORMAL.  THE ROOT SLEEVES FILL NORMALLY.  THE PATIENT HAS A SPINAL CORD STIMULATOR IN PLACE WHICH EXTENDS FROM THE LEFT/MIDLINE OVER TO THE RIGHT SIDE WITH MARKERS FROM C6 TO T1. IMPRESSION: NORMAL CERVICAL MYELOGRAM WITH A SPINAL CORD STIMULATOR IN PLACE AS DESCRIBED. UPPER THORACIC MYELOGRAM: THE PATIENT WAS STUDIED FROM T1 DOWN TO T7.  THE SPINAL CANAL IS WIDELY PATENT.  NO ANTERIOR EXTRADURAL DEFECTS.  THE SPINAL CORD SHADOW IS  NORMAL. IMPRESSION: NORMAL THORACIC MYELOGRAM. POST MYELOGRAM CT SCAN: SPIRAL SCANNING IS PERFORMED FROM C2 TO T7. CERVICAL REGION IS NORMAL WITHOUT EVIDENCE OF DISK PATHOLOGY.  THE SPINAL CANAL AND NEURAL FORAMINA ARE ALL WIDELY PATENT.  THE STIMULATOR DEVISE CAN BE SEEN DORSALLY EXTENDING OVER TO THE RIGHT SIDE OF THE MIDLINE GOING FROM T6 DOWN TO T1. IMPRESSION: NORMAL CERVICAL MYELOGRAM WITH A SPINAL CORD STIMULATOR PRESENT. THORACIC POST MYELOGRAM CT: SPIRAL SCANNING IS PERFORMED FROM T1 TO T7.  ALIGNMENT IS NORMAL.  THERE IS NO EVIDENCE OF DISK PATHOLOGY.  THERE ARE  SOME INSIGNIFICANT ANTERIOR OSTEOPHYTES.  THE SPINAL CANAL IS WIDELY PATENT WITH AMPLE SUBARACHNOID SPACE SURROUNDING THE CORD.  NO FORAMINAL LESION IS SEEN.  NO SIGN OF FACET DISEASE. IMPRESSION NEGATIVE THORACIC POST MYELOGRAM CT FROM T1 TO T7.   Meds  The patient has a current medication list which includes the following prescription(s): albuterol, alprazolam, oxycodone, oxycodone, oxycodone, polyethylene glycol, pramipexole, and melatonin.  Current Outpatient Prescriptions on File Prior to Visit  Medication Sig  . albuterol (PROVENTIL HFA;VENTOLIN HFA) 108 (90 Base) MCG/ACT inhaler Inhale 2 puffs into the lungs every 6 (six) hours as needed for wheezing or shortness of breath.  . polyethylene glycol (MIRALAX / GLYCOLAX) packet Take by mouth. Reported on 12/16/2015  . pramipexole (MIRAPEX) 1.5 MG tablet Take 1.5 mg by mouth 3 (three) times daily.   No current facility-administered medications on file prior to visit.    ROS  Constitutional: Denies any fever or chills Gastrointestinal: No reported hemesis, hematochezia, vomiting, or acute GI distress Musculoskeletal: Denies any acute onset joint swelling, redness, loss of ROM, or weakness Neurological: No reported episodes of acute onset apraxia, aphasia, dysarthria, agnosia, amnesia, paralysis, loss of coordination, or loss of consciousness  Allergies  Ms. Mckimmy is allergic to lisinopril.  PFSH  Medical:  Ms. Daloia  has a past medical history of RSD (reflex sympathetic dystrophy); Spinal cord stimulator status; CHF (congestive heart failure) (HCC); Depression; Vitamin D deficiency; Vitamin B12 deficiency; Anxiety; Reflux; Congestive heart failure (HCC) (07/25/2015); Lower extremity pain (07/25/2015); Arm pain (07/25/2015); and COPD (chronic obstructive pulmonary disease) (HCC). Family: family history includes Cancer in her father; Cirrhosis in her mother. Surgical:  has past surgical history that includes Spinal cord stimulator  implant. Tobacco:  reports that she has quit smoking. Her smoking use included Cigarettes. She smoked 0.50 packs per day. She does not have any smokeless tobacco history on file. Alcohol:  reports that she does not drink alcohol. Drug:  reports that she does not use illicit drugs.  Constitutional Exam  Vitals: Blood pressure 136/86, pulse 78, temperature 98.5 F (36.9 C), temperature source Oral, resp. rate 16, height  (1.448 m), weight 125 lb (56.7 kg), SpO2 100 %. General appearance: Well nourished, well developed, and well hydrated. In no acute distress Calculated BMI/Body habitus: Body mass index is 27.04 kg/(m^2). (25-29.9 kg/m2) Overweight - 20% higher incidence of chronic pain Psych/Mental status: Alert and oriented x 3 (person, place, & time) Eyes: PERLA Respiratory: No evidence of acute respiratory distress  Cervical Spine Exam  Inspection: No masses, redness, or swelling Alignment: Symmetrical ROM: Functional: ROM is within functional limits [facility]) Stability: No instability detected Muscle strength & Tone: Functionally intact Sensory: Unimpaired Palpation: No complaints of tenderness  Upper Extremity (UE) Exam    Side: Right upper extremity  Side: Left upper extremity  Inspection: No masses, redness, swelling, or asymmetry  Inspection: No masses, redness, swelling, or asymmetry  ROM:  ROM:  Functional:  ROM is within functional limits [facility])  Functional: ROM is within functional limits [facility])  Muscle strength & Tone: Functionally intact  Muscle strength & Tone: Functionally intact  Sensory: Unimpaired  Sensory: Unimpaired  Palpation: Non-contributory  Palpation: Non-contributory   Thoracic Spine Exam  Inspection: No masses, redness, or swelling Alignment: Symmetrical ROM: Functional: ROM is within functional limits [facility]) Stability: No instability detected Sensory: Unimpaired Muscle strength & Tone: Functionally intact Palpation: No complaints of  tenderness  Lumbar Spine Exam  Inspection: No masses, redness, or swelling Alignment: Symmetrical ROM: Functional: ROM is within functional limits [facility]) Stability: No instability detected Muscle strength & Tone: Functionally intact Sensory: Unimpaired Palpation: No complaints of tenderness Provocative Tests: Lumbar Hyperextension and rotation test: deferred Patrick's Maneuver: deferred  Gait & Posture Assessment  Ambulation: Unassisted Gait: Unaffected Posture: WNL  Lower Extremity Exam    Side: Right lower extremity  Side: Left lower extremity  Inspection: No masses, redness, swelling, or asymmetry ROM:  Inspection: No masses, redness, swelling, or asymmetry ROM:  Functional: ROM is within functional limits [facility])  Functional: ROM is within functional limits [facility])  Muscle strength & Tone: Functionally intact  Muscle strength & Tone: Functionally intact  Sensory: Unimpaired  Sensory: Unimpaired  Palpation: Non-contributory  Palpation: Non-contributory   Assessment & Plan  Primary Diagnosis & Pertinent Problem List: The primary encounter diagnosis was Chronic pain. Diagnoses of Encounter for therapeutic drug level monitoring, Long term current use of opiate analgesic, Opiate use (30 MME/Day), Chronic pain of both upper extremities, Chronic pain of lower extremity, unspecified laterality, Complex regional pain syndrome I of lower limb, right, Complex regional pain syndrome I of upper limb, right, Presence of spinal cord stimulator, Generalized anxiety disorder, Insomnia secondary to chronic pain, Vitamin B12 deficiency, Vitamin D deficiency, and Lumbar facet syndrome (Bilateral) were also pertinent to this visit.  Visit Diagnosis: 1. Chronic pain   2. Encounter for therapeutic drug level monitoring   3. Long term current use of opiate analgesic   4. Opiate use (30 MME/Day)   5. Chronic pain of both upper extremities   6. Chronic pain of lower extremity, unspecified laterality    7. Complex regional pain syndrome I of lower limb, right   8. Complex regional pain syndrome I of upper limb, right   9. Presence of spinal cord stimulator   10. Generalized anxiety disorder   11. Insomnia secondary to chronic pain   12. Vitamin B12 deficiency   13. Vitamin D deficiency   14. Lumbar facet syndrome (Bilateral)     Problems updated and reviewed during this visit: Problem  Lumbar facet syndrome (Bilateral)  Complex regional pain syndrome I of upper limb (Bilateral)  Complex regional pain syndrome I of lower limb (Bilateral)    Problem-specific Plan(s): No problem-specific assessment & plan notes found for this encounter.  No new assessment & plan notes have been filed under this hospital service since the last note was generated. Service: Pain Management   Plan of Care   Problem List Items Addressed This Visit      High   Chronic lower extremity pain (Bilateral) (Chronic)   Relevant Orders   LUMBAR SYMPATHETIC BLOCK   Chronic pain - Primary (Chronic)   Relevant Medications   oxyCODONE (OXY IR/ROXICODONE) 5 MG immediate release tablet   oxyCODONE (OXY IR/ROXICODONE) 5 MG immediate release tablet   oxyCODONE (OXY IR/ROXICODONE) 5 MG immediate release tablet   Other Relevant Orders   Comprehensive metabolic panel (Completed)   C-reactive protein  Magnesium   Sedimentation rate   Chronic upper extremity pain (Bilateral) (Chronic)   Relevant Medications   oxyCODONE (OXY IR/ROXICODONE) 5 MG immediate release tablet   oxyCODONE (OXY IR/ROXICODONE) 5 MG immediate release tablet   oxyCODONE (OXY IR/ROXICODONE) 5 MG immediate release tablet   Other Relevant Orders   STELLATE GANGLION BLOCK   Complex regional pain syndrome I of lower limb (Bilateral) (Chronic)   Relevant Medications   ALPRAZolam (XANAX) 0.25 MG tablet   Other Relevant Orders   LUMBAR SYMPATHETIC BLOCK   Complex regional pain syndrome I of upper limb (Bilateral) (Chronic)   Relevant  Medications   ALPRAZolam (XANAX) 0.25 MG tablet   Other Relevant Orders   STELLATE GANGLION BLOCK   Lumbar facet syndrome (Bilateral) (Chronic)   Relevant Medications   oxyCODONE (OXY IR/ROXICODONE) 5 MG immediate release tablet   oxyCODONE (OXY IR/ROXICODONE) 5 MG immediate release tablet   oxyCODONE (OXY IR/ROXICODONE) 5 MG immediate release tablet   Other Relevant Orders   LUMBAR FACET(MEDIAL BRANCH NERVE BLOCK) MBNB   Presence of spinal cord stimulator (Chronic)     Medium   Encounter for therapeutic drug level monitoring   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)   Relevant Medications   oxyCODONE (OXY IR/ROXICODONE) 5 MG immediate release tablet   oxyCODONE (OXY IR/ROXICODONE) 5 MG immediate release tablet   oxyCODONE (OXY IR/ROXICODONE) 5 MG immediate release tablet   Melatonin 10 MG CAPS   Long term current use of opiate analgesic (Chronic)   Relevant Orders   ToxASSURE Select 13 (MW), Urine   Opiate use (30 MME/Day) (Chronic)     Low   Generalized anxiety disorder   Relevant Medications   ALPRAZolam (XANAX) 0.25 MG tablet   Vitamin B12 deficiency   Relevant Orders   Vitamin B12   Vitamin D deficiency   Relevant Orders   25-Hydroxyvitamin D Lcms D2+D3       Pharmacotherapy (Medications Ordered): Meds ordered this encounter  Medications  . oxyCODONE (OXY IR/ROXICODONE) 5 MG immediate release tablet    Sig: Take 1 tablet (5 mg total) by mouth every 6 (six) hours as needed for severe pain.    Dispense:  120 tablet    Refill:  0    Do not place this medication, or any other prescription from our practice, on "Automatic Refill". Patient may have prescription filled one day early if pharmacy is closed on scheduled refill date. Do not fill until: 03/19/16 To last until: 04/18/16  . oxyCODONE (OXY IR/ROXICODONE) 5 MG immediate release tablet    Sig: Take 1 tablet (5 mg total) by mouth every 6 (six) hours as needed for severe pain.    Dispense:  120 tablet     Refill:  0    Do not place this medication, or any other prescription from our practice, on "Automatic Refill". Patient may have prescription filled one day early if pharmacy is closed on scheduled refill date. Do not fill until: 04/18/16 To last until: 05/18/16  . oxyCODONE (OXY IR/ROXICODONE) 5 MG immediate release tablet    Sig: Take 1 tablet (5 mg total) by mouth every 6 (six) hours as needed for severe pain.    Dispense:  120 tablet    Refill:  0    Do not place this medication, or any other prescription from our practice, on "Automatic Refill". Patient may have prescription filled one day early if pharmacy is closed on scheduled refill date. Do not fill until: 05/18/16 To last until:  06/17/16  . ALPRAZolam (XANAX) 0.25 MG tablet    Sig: Take 1 tablet (0.25 mg total) by mouth at bedtime as needed for anxiety.    Dispense:  30 tablet    Refill:  2    Do not place this medication, or any other prescription from our practice, on "Automatic Refill". Patient may have prescription filled one day early if pharmacy is closed on scheduled refill date. Do not fill until: 03/18/16. Do not refill any sooner than every 30 days.  . Melatonin 10 MG CAPS    Sig: Take 20 mg by mouth at bedtime as needed.    Dispense:  60 capsule    Refill:  2    Do not place this medication, or any other prescription from our practice, on "Automatic Refill". Patient may have prescription filled one day early if pharmacy is closed on scheduled refill date.    Lab-work & Procedure Ordered: Orders Placed This Encounter  Procedures  . LUMBAR FACET(MEDIAL BRANCH NERVE BLOCK) MBNB  . LUMBAR SYMPATHETIC BLOCK  . STELLATE GANGLION BLOCK  . ToxASSURE Select 13 (MW), Urine  . Comprehensive metabolic panel  . C-reactive protein  . Magnesium  . Sedimentation rate  . Vitamin B12  . 25-Hydroxyvitamin D Lcms D2+D3    Imaging Ordered: None  Interventional Therapies: Scheduled:  None at this time.    Considering:   None at this time.    PRN Procedures:   1. Palliative stellate ganglion block under fluoroscopic guidance and IV sedation for the upper extremity complex regional pain syndrome.  2. Palliative  Lumbar sympathetic block under fluoroscopic guidance and IV sedation for the lower extremity complex regional pain syndrome.  3. Diagnostic bilateral lumbar facet block under fluoroscopic guidance and IV sedation.    Referral(s) or Consult(s): None at this time.  New Prescriptions   MELATONIN 10 MG CAPS    Take 20 mg by mouth at bedtime as needed.    Medications administered during this visit: Ms. Vanwinkle had no medications administered during this visit.  Requested PM Follow-up: Return in about 2 months (around 06/01/2016) for Medication Management, (3-Mo), Procedure (PRN - Patient will call).  Future Appointments Date Time Provider Department Center  06/01/2016 9:40 AM Delano Metz, MD [facility] None    Primary Care Physician: No PCP Per Patient Location: [facility] Note by: Sydnee Levans. Laban Emperor, M.D, DABA, DABAPM, DABPM, DABIPP, FIPP  Pain Score Disclaimer: We use the NRS-11 scale. This is a self-reported, subjective measurement of pain severity with only modest accuracy. It is used primarily to identify changes within a particular patient. It must be understood that outpatient pain scales are significantly less accurate that those used for research, where they can be applied under ideal controlled circumstances with minimal exposure to variables. In reality, the score is likely to be a combination of pain intensity and pain affect, where pain affect describes the degree of emotional arousal or changes in action readiness caused by the sensory experience of pain. Factors such as social and work situation, setting, emotional state, anxiety levels, expectation, and prior pain experience may influence pain perception and show large inter-individual differences that  may also be affected by time variables.  Patient instructions provided during this appointment: Patient Instructions  Please have blood work done as soon as possible.  A prescription for OXYCODONE X 3 AND ALPRAZOLAM was given to you today.

## 2016-03-18 NOTE — Patient Instructions (Addendum)
Please have blood work done as soon as possible.  A prescription for OXYCODONE X 3 AND ALPRAZOLAM was given to you today.

## 2016-03-18 NOTE — Progress Notes (Signed)
Safety precautions to be maintained throughout the outpatient stay will include: orient to surroundings, keep bed in low position, maintain call bell within reach at all times, provide assistance with transfer out of bed and ambulation. Did not bring pill bottles for count.  Reminded to bring to all appointments 

## 2016-03-19 NOTE — Progress Notes (Signed)

## 2016-03-19 NOTE — Progress Notes (Signed)
Quick Note:   While most low ALT level results indicate a normal healthy liver, that may not always be the case. A low-functioning or non-functioning liver, lacking normal levels of ALT activity to begin with, would not release a lot of ALT into the blood when damaged. People infected with the hepatitis C virus initially show high ALT levels in their blood, but these levels fall over time. Because the ALT test measures ALT levels at only one point in time, people with chronic hepatitis C infection may already have experienced the ALT peak well before blood was drawn for the ALT test. Urinary tract infections or malnutrition may also cause low blood ALT levels. This is a blood test that measures the amount of a substance called bilirubin. This test is used to find out how well your liver is working. It is often given as part of a panel of tests that measure liver function. A small amount of bilirubin in your blood is normal, but a high level may be a sign of liver disease.  The liver makes bile to help you digest food, and bile contains bilirubin. Most bilirubin comes from the body's normal process of breaking down old red blood cells. A healthy liver can normally get rid of bilirubin. But when you have liver problems, it can build up in your body to unhealthy levels.  The reference range of total bilirubin is 0.3 - 1.2 mg/dL. The reference range of direct bilirubin is 0.1-0.4 mg/dL.  Low levels of bilirubin (<0.3) are generally not concerning and are not monitored. ______

## 2016-03-22 LAB — 25-HYDROXYVITAMIN D LCMS D2+D3
25-HYDROXY, VITAMIN D-2: 1.6 ng/mL
25-HYDROXY, VITAMIN D-3: 33 ng/mL

## 2016-03-22 LAB — VITAMIN D PNL(25-HYDRXY+1,25-DIHY)-BLD
Vit D, 1,25-Dihydroxy: 76.2 pg/mL (ref 19.9–79.3)
Vit D, 25-Hydroxy: 25.3 ng/mL — ABNORMAL LOW (ref 30.0–100.0)

## 2016-03-22 LAB — 25-HYDROXY VITAMIN D LCMS D2+D3: 25-Hydroxy, Vitamin D: 35 ng/mL

## 2016-03-26 LAB — TOXASSURE SELECT 13 (MW), URINE: PDF: 0

## 2016-05-11 ENCOUNTER — Encounter: Payer: Self-pay | Admitting: Pain Medicine

## 2016-06-01 ENCOUNTER — Encounter: Payer: Self-pay | Admitting: Pain Medicine

## 2016-06-01 ENCOUNTER — Ambulatory Visit: Payer: Self-pay | Attending: Pain Medicine | Admitting: Pain Medicine

## 2016-06-01 VITALS — BP 138/71 | HR 71 | Temp 98.2°F | Resp 16 | Wt 125.0 lb

## 2016-06-01 DIAGNOSIS — R700 Elevated erythrocyte sedimentation rate: Secondary | ICD-10-CM | POA: Insufficient documentation

## 2016-06-01 DIAGNOSIS — M545 Low back pain: Secondary | ICD-10-CM | POA: Insufficient documentation

## 2016-06-01 DIAGNOSIS — Z79891 Long term (current) use of opiate analgesic: Secondary | ICD-10-CM | POA: Insufficient documentation

## 2016-06-01 DIAGNOSIS — E538 Deficiency of other specified B group vitamins: Secondary | ICD-10-CM | POA: Insufficient documentation

## 2016-06-01 DIAGNOSIS — Z969 Presence of functional implant, unspecified: Secondary | ICD-10-CM | POA: Insufficient documentation

## 2016-06-01 DIAGNOSIS — F411 Generalized anxiety disorder: Secondary | ICD-10-CM

## 2016-06-01 DIAGNOSIS — J449 Chronic obstructive pulmonary disease, unspecified: Secondary | ICD-10-CM | POA: Insufficient documentation

## 2016-06-01 DIAGNOSIS — G4701 Insomnia due to medical condition: Secondary | ICD-10-CM | POA: Insufficient documentation

## 2016-06-01 DIAGNOSIS — Z87891 Personal history of nicotine dependence: Secondary | ICD-10-CM | POA: Insufficient documentation

## 2016-06-01 DIAGNOSIS — G8929 Other chronic pain: Secondary | ICD-10-CM

## 2016-06-01 DIAGNOSIS — G90513 Complex regional pain syndrome I of upper limb, bilateral: Secondary | ICD-10-CM

## 2016-06-01 DIAGNOSIS — E559 Vitamin D deficiency, unspecified: Secondary | ICD-10-CM | POA: Insufficient documentation

## 2016-06-01 DIAGNOSIS — I509 Heart failure, unspecified: Secondary | ICD-10-CM | POA: Insufficient documentation

## 2016-06-01 DIAGNOSIS — M79601 Pain in right arm: Secondary | ICD-10-CM

## 2016-06-01 DIAGNOSIS — M79602 Pain in left arm: Secondary | ICD-10-CM

## 2016-06-01 DIAGNOSIS — G90523 Complex regional pain syndrome I of lower limb, bilateral: Secondary | ICD-10-CM

## 2016-06-01 MED ORDER — OXYCODONE HCL 5 MG PO TABS: 5.0000 mg | ORAL_TABLET | Freq: Four times a day (QID) | ORAL | 0 refills | Status: DC | PRN

## 2016-06-01 MED ORDER — ALPRAZOLAM 0.25 MG PO TABS: 0.1250 mg | ORAL_TABLET | Freq: Every evening | ORAL | 2 refills | Status: DC | PRN

## 2016-06-01 MED ORDER — MELATONIN 10 MG PO CAPS: 20.0000 mg | ORAL_CAPSULE | Freq: Every evening | ORAL | 2 refills | Status: DC | PRN

## 2016-06-01 MED ORDER — ALPRAZOLAM 0.25 MG PO TABS: 0.2500 mg | ORAL_TABLET | Freq: Every evening | ORAL | 2 refills | Status: DC | PRN

## 2016-06-01 NOTE — Progress Notes (Signed)
Patient's Name: Elizabeth [name]  Patient type: Established  MRN: [id number]  Service setting: Ambulatory outpatient  DOB: 18-Aug-1961  Location: [facility]  DOS: 06/01/2016  Primary Care Physician: No PCP Per Patient  Note by: Para March A. Laban Emperor, M.D  Referring Physician: No ref. provider found  Specialty: Interventional Pain Management  Last Visit to Pain Management: 03/18/2016   Primary Reason(s) for Visit: Encounter for prescription drug management (Level of risk: moderate) CC: Hand Pain (right)   HPI  Elizabeth [name] is a 55 y.o. year old, female patient, who returns today as an established patient. She has Chronic low back pain; Generalized anxiety disorder; Encounter for therapeutic drug level monitoring; Long term current use of opiate analgesic; Uncomplicated opioid dependence (HCC); Opiate use (30 MME/Day); Chronic pain syndrome; Complex regional pain syndrome I of upper limb (Bilateral); Complex regional pain syndrome I of lower limb (Bilateral); Opiate analgesic use agreement exists; Presence of spinal cord stimulator; Chronic obstructive pulmonary disease (COPD) (HCC); Congestive heart failure (HCC); Vitamin D deficiency; Vitamin B12 deficiency; Chronic upper extremity pain (Bilateral); Chronic lower extremity pain (Bilateral); Long term prescription opiate use; Encounter for chronic pain management; Elevated sedimentation rate; Chronic pain; Insomnia secondary to chronic pain; and Lumbar facet syndrome (Bilateral) on her problem list.. Her primarily concern today is the Hand Pain (right)   Pain Assessment: Self-Reported Pain Score: 3              Reported level is compatible with observation       Pain Type: Chronic pain Pain Location: Hand Pain Orientation: Right Pain Descriptors / Indicators: Aching, Burning Pain Frequency: Constant  The patient comes into the clinics today for pharmacological management of her chronic pain. I last saw this patient on 03/18/2016. The  patient  reports that she does not use drugs. Her body mass index is 27.05 kg/m.   Today the patient has requested that I provide more information about her long-term care in this note so that this can be used to assess her impairment and disability. This patient was initially referred to me on 04/20/2002 by Dr. Cindee Salt (Fax: [phone]; Tel.: [phone]) for evaluation of a bilateral upper extremity pain secondary to a complex regional pain syndrome. At that time, the patient primary care physician was Dr. Tama High (Fax: [phone]; Tel.: [phone]) and the patient's caseworker was Darcella Gasman, RN (Fax: [phone]). 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 The patient has undergone multiple series of therapeutic and diagnostic stellate ganglion blocks, as well as occasional hospital admissions for continuous cervical epidural infusions for the prolonged treatment of the CRPS. When she first came into my practice she was suffering from a right upper extremity CRPS (2003). Over time this condition spread to the left upper extremity as well as both of her lower extremities. Her case has been well documented over time. She has been treated with conservative therapies as well as more aggressive interventional therapies. I one time the patient was admitted in the hospital with a continuous infusion of local anesthetics and opioids through a Cervical epidural catheter as well as a lumbar, for her upper extremity CRPS and lower extremity CRPS, respectively. I have followed this patient for the past 14 years. Over that time, she has had periods of time where her condition is stable and in remission. Unfortunately, this has also been associated with occasional flareups of the CRPS involving all 4 extremities. When this happens, it requires aggressive interventional  therapies as both of her arms and legs become extremely swollen to the point where the skin ruptures  leading to secondary opportunistic infections. There is no question in my mind that of all the patients that I have treated in my career, there is no other patient more the serving of disability than this patient.  Date of Last Visit: 03/18/16 Service Provided on Last Visit: Med Refill  Controlled Substance Pharmacotherapy Assessment & REMS (Risk Evaluation and Mitigation Strategy)  Analgesic: Oxycodone IR 5 mg 1 tablet by mouth every 6 hours (20 mg/day) MME/day: 30 mg/day Pill Count: oxycodone 5 mg is 45/120 filled on 05/18/2016; and xanax 12/30 filled on 05/18/2016. Pharmacokinetics: Onset of action (Liberation/Absorption): Within expected pharmacological parameters Time to Peak effect (Distribution): Timing and results are as within normal expected parameters Duration of action (Metabolism/Excretion): Within normal limits for medication Pharmacodynamics: Analgesic Effect: More than 50% Activity Facilitation: Medication(s) allow patient to sit, stand, walk, and do the basic ADLs Perceived Effectiveness: Described as relatively effective, allowing for increase in activities of daily living (ADL) Side-effects or Adverse reactions: None reported Monitoring: [place] PMP: Online review of the past 8616-month period conducted. Compliant with practice rules and regulations Last UDS on record: [employer] Select 13  Date Value Ref Range Status  03/18/2016 FINAL  Final    Comment:    ==================================================================== TOXASSURE SELECT 13 (MW) ==================================================================== Test                             Result       Flag       Units Drug Present and Declared for Prescription Verification   Oxycodone                      1380         EXPECTED   ng/mg creat   Oxymorphone                    582          EXPECTED   ng/mg creat   Noroxycodone                   3257         EXPECTED   ng/mg creat   Noroxymorphone                 129           EXPECTED   ng/mg creat    Sources of oxycodone are scheduled prescription medications.    Oxymorphone, noroxycodone, and noroxymorphone are expected    metabolites of oxycodone. Oxymorphone is also available as a    scheduled prescription medication. Drug Present not Declared for Prescription Verification   Oxazepam                       104          UNEXPECTED ng/mg creat    Oxazepam may be administered as a scheduled prescription    medication; it is also an expected metabolite of other    benzodiazepine drugs, including diazepam, chlordiazepoxide,    prazepam, clorazepate, halazepam, and temazepam. Drug Absent but Declared for Prescription Verification   Alprazolam                     Not Detected UNEXPECTED ng/mg creat ==================================================================== Test  Result    Flag   Units      Ref Range   Creatinine              49               mg/dL      >=16>=20 ==================================================================== Declared Medications:  The flagging and interpretation on this report are based on the  following declared medications.  Unexpected results may arise from  inaccuracies in the declared medications.  **Note: The testing scope of this panel includes these medications:  Alprazolam (Xanax)  Oxycodone  **Note: The testing scope of this panel does not include following  reported medications:  Albuterol  Polyethylene Glycol  Pramipexole (Mirapex) ==================================================================== For clinical consultation, please call [phone]. ====================================================================    UDS interpretation: Compliant          Medication Assessment Form: Reviewed. Patient indicates being compliant with therapy Treatment compliance: Compliant Risk Assessment: Aberrant Behavior: None observed today Substance Use Disorder (SUD) Risk Level:  Low-to-moderate Risk of opioid abuse or dependence: 0.7-3.0% with doses ? 36 MME/day and 6.1-26% with doses ? 120 MME/day. Opioid Risk Tool (ORT) Score: Total Score: 0 Low Risk for SUD (Score <3) Depression Scale Score: PHQ-2: PHQ-2 Total Score: 0 No depression (0) PHQ-9: PHQ-9 Total Score: 0 No depression (0-4)  Pharmacologic Plan: No change in therapy, at this time  Laboratory Chemistry  Inflammation Markers Lab Results  Component Value Date   ESRSEDRATE 26 03/18/2016   CRP 1.2 (H) 03/18/2016    Renal Function Lab Results  Component Value Date   BUN 14 03/18/2016   CREATININE 0.71 03/18/2016   GFRAA >60 03/18/2016   GFRNONAA >60 03/18/2016    Hepatic Function Lab Results  Component Value Date   AST 18 03/18/2016   ALT 12 (L) 03/18/2016   ALBUMIN 4.1 03/18/2016    Electrolytes Lab Results  Component Value Date   NA 139 03/18/2016   K 3.8 03/18/2016   CL 109 03/18/2016   CALCIUM 9.0 03/18/2016   MG 2.2 03/18/2016    Pain Modulating Vitamins Lab Results  Component Value Date   VD25OH 25.3 (L) 03/18/2016   VD125OH2TOT 76.2 03/18/2016   25OHVITD1 35 03/18/2016   25OHVITD2 1.6 03/18/2016   25OHVITD3 33 03/18/2016   VITAMINB12 212 03/18/2016    Coagulation Parameters Lab Results  Component Value Date   PLT 214 03/08/2014    Cardiovascular Lab Results  Component Value Date   HGB 13.0 03/08/2014   HCT 38.9 03/08/2014    Note: Lab results reviewed.  Recent Diagnostic Imaging  No results found.  Meds  The patient has a current medication list which includes the following prescription(s): albuterol, alprazolam, melatonin, oxycodone, oxycodone, oxycodone, and polyethylene glycol.  Current Outpatient Prescriptions on File Prior to Visit  Medication Sig  . albuterol (PROVENTIL HFA;VENTOLIN HFA) 108 (90 Base) MCG/ACT inhaler Inhale 2 puffs into the lungs every 6 (six) hours as needed for wheezing or shortness of breath.  . polyethylene glycol (MIRALAX  / GLYCOLAX) packet Take by mouth. Reported on 12/16/2015   No current facility-administered medications on file prior to visit.     ROS  Constitutional: Denies any fever or chills Gastrointestinal: No reported hemesis, hematochezia, vomiting, or acute GI distress Musculoskeletal: Denies any acute onset joint swelling, redness, loss of ROM, or weakness Neurological: No reported episodes of acute onset apraxia, aphasia, dysarthria, agnosia, amnesia, paralysis, loss of coordination, or loss of consciousness  Allergies  Ms. Cott is allergic  to lisinopril.  PFSH  Medical:  Ms. Braun  has a past medical history of Anxiety; Arm pain (07/25/2015); CHF (congestive heart failure) (HCC); Congestive heart failure (HCC) (07/25/2015); COPD (chronic obstructive pulmonary disease) (HCC); Depression; Lower extremity pain (07/25/2015); Reflux; RSD (reflex sympathetic dystrophy); Spinal cord stimulator status; Vitamin B12 deficiency; and Vitamin D deficiency. Family: family history includes Cancer in her father; Cirrhosis in her mother. Surgical:  has a past surgical history that includes Spinal cord stimulator implant. Tobacco:  reports that she has quit smoking. Her smoking use included Cigarettes. She smoked 0.50 packs per day. She does not have any smokeless tobacco history on file. Alcohol:  reports that she does not drink alcohol. Drug:  reports that she does not use drugs.  Constitutional Exam  Vitals: Blood pressure 138/71, pulse 71, temperature 98.2 F (36.8 C), temperature source Oral, resp. rate 16, weight 125 lb (56.7 kg), SpO2 98 %. General appearance: Well nourished, well developed, and well hydrated. In no acute distress. For now her CRPS seems to be stable and in remission. Calculated BMI/Body habitus: Body mass index is 27.05 kg/m. (25-29.9 kg/m2) Overweight - 20% higher incidence of chronic pain Psych/Mental status: Alert and oriented x 3 (person, place, & time) Eyes:  PERLA Respiratory: No evidence of acute respiratory distress  Cervical Spine Exam  Inspection: No masses, redness, or swelling Alignment: Symmetrical Functional ROM: ROM appears unrestricted Stability: No instability detected Muscle strength & Tone: Functionally intact Sensory: Unimpaired Palpation: Non-contributory  Upper Extremity (UE) Exam    Side: Right upper extremity  Side: Left upper extremity  Inspection: No masses, redness, swelling, or asymmetry  Inspection: No masses, redness, swelling, or asymmetry  Functional ROM: ROM appears unrestricted  Functional ROM: ROM appears unrestricted  Muscle strength & Tone: Functionally intact  Muscle strength & Tone: Functionally intact  Sensory: Unimpaired  Sensory: Unimpaired  Palpation: Complains of area being tender to palpation  Palpation: Complains of area being tender to palpation   Thoracic Spine Exam  Inspection: No masses, redness, or swelling Alignment: Symmetrical Functional ROM: ROM appears unrestricted Stability: No instability detected Sensory: Unimpaired Muscle strength & Tone: Functionally intact Palpation: Non-contributory  Lumbar Spine Exam  Inspection: No masses, redness, or swelling Alignment: Symmetrical Functional ROM: ROM appears unrestricted Stability: No instability detected Muscle strength & Tone: Functionally intact Sensory: Unimpaired Palpation: Non-contributory Provocative Tests: Lumbar Hyperextension and rotation test: evaluation deferred today       Patrick's Maneuver: evaluation deferred today              Gait & Posture Assessment  Ambulation: Unassisted Gait: Relatively normal for age and body habitus Posture: WNL   Lower Extremity Exam    Side: Right lower extremity  Side: Left lower extremity  Inspection: No masses, redness, swelling, or asymmetry  Inspection: No masses, redness, swelling, or asymmetry  Functional ROM: ROM appears unrestricted  Functional ROM: ROM appears unrestricted   Muscle strength & Tone: Functionally intact  Muscle strength & Tone: Functionally intact  Sensory: Unimpaired  Sensory: Unimpaired  Palpation: Complains of area being tender to palpation  Palpation: Complains of area being tender to palpation    Assessment & Plan  Primary Diagnosis & Pertinent Problem List: The primary encounter diagnosis was Complex regional pain syndrome i of lower limb, bilateral. Diagnoses of Chronic pain of both upper extremities, Insomnia secondary to chronic pain, Generalized anxiety disorder, and Complex regional pain syndrome i of upper limb, bilateral were also pertinent to this visit.  Visit Diagnosis: 1.  Complex regional pain syndrome i of lower limb, bilateral   2. Chronic pain of both upper extremities   3. Insomnia secondary to chronic pain   4. Generalized anxiety disorder   5. Complex regional pain syndrome i of upper limb, bilateral     Problems updated and reviewed during this visit: No problems updated.  Problem-specific Plan(s): No problem-specific Assessment & Plan notes found for this encounter.  No new Assessment & Plan notes have been filed under this hospital service since the last note was generated. Service: Pain Management   Plan of Care   Problem List Items Addressed This Visit      High   Chronic upper extremity pain (Bilateral) (Chronic)   Relevant Medications   oxyCODONE (OXY IR/ROXICODONE) 5 MG immediate release tablet   oxyCODONE (OXY IR/ROXICODONE) 5 MG immediate release tablet   oxyCODONE (OXY IR/ROXICODONE) 5 MG immediate release tablet   Complex regional pain syndrome I of lower limb (Bilateral) - Primary (Chronic)   Relevant Medications   ALPRAZolam (XANAX) 0.25 MG tablet   Complex regional pain syndrome I of upper limb (Bilateral) (Chronic)   Relevant Medications   ALPRAZolam (XANAX) 0.25 MG tablet     Medium   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)   Relevant Medications   oxyCODONE (OXY IR/ROXICODONE) 5  MG immediate release tablet   oxyCODONE (OXY IR/ROXICODONE) 5 MG immediate release tablet   oxyCODONE (OXY IR/ROXICODONE) 5 MG immediate release tablet   Melatonin 10 MG CAPS     Low   Generalized anxiety disorder   Relevant Medications   ALPRAZolam (XANAX) 0.25 MG tablet    Other Visit Diagnoses   None.      Pharmacotherapy (Medications Ordered): Meds ordered this encounter  Medications  . oxyCODONE (OXY IR/ROXICODONE) 5 MG immediate release tablet    Sig: Take 1 tablet (5 mg total) by mouth every 6 (six) hours as needed for severe pain.    Dispense:  120 tablet    Refill:  0    Do not place this medication, or any other prescription from our practice, on "Automatic Refill". Patient may have prescription filled one day early if pharmacy is closed on scheduled refill date. Do not fill until: 06/17/16 To last until: 07/17/16  . oxyCODONE (OXY IR/ROXICODONE) 5 MG immediate release tablet    Sig: Take 1 tablet (5 mg total) by mouth every 6 (six) hours as needed for severe pain.    Dispense:  120 tablet    Refill:  0    Do not place this medication, or any other prescription from our practice, on "Automatic Refill". Patient may have prescription filled one day early if pharmacy is closed on scheduled refill date. Do not fill until: 07/17/16 To last until: 08/16/16  . oxyCODONE (OXY IR/ROXICODONE) 5 MG immediate release tablet    Sig: Take 1 tablet (5 mg total) by mouth every 6 (six) hours as needed for severe pain.    Dispense:  120 tablet    Refill:  0    Do not place this medication, or any other prescription from our practice, on "Automatic Refill". Patient may have prescription filled one day early if pharmacy is closed on scheduled refill date. Do not fill until: 08/16/16 To last until: 09/14/16  . Melatonin 10 MG CAPS    Sig: Take 20 mg by mouth at bedtime as needed.    Dispense:  60 capsule    Refill:  2    Do not place  this medication, or any other prescription from our  practice, on "Automatic Refill". Patient may have prescription filled one day early if pharmacy is closed on scheduled refill date.  . ALPRAZolam (XANAX) 0.25 MG tablet    Sig: Take 0.5 tablets (0.125 mg total) by mouth at bedtime as needed for anxiety.    Dispense:  15 tablet    Refill:  2    Do not place this medication, or any other prescription from our practice, on "Automatic Refill". Patient may have prescription filled one day early if pharmacy is closed on scheduled refill date. Do not fill until: 06/01/16. Do not refill any sooner than every 30 days.    Lab-work & Procedure Ordered: No orders of the defined types were placed in this encounter.   Imaging Ordered: None  Interventional Therapies: Scheduled:  None at this time.    Considering:  None at this time.    PRN Procedures:   1. Palliative stellate ganglion block under fluoroscopic guidance and IV sedation for the upper extremity complex regional pain syndrome.  2. Palliative  Lumbar sympathetic block under fluoroscopic guidance and IV sedation for the lower extremity complex regional pain syndrome.  3. Diagnostic bilateral lumbar facet block under fluoroscopic guidance and IV sedation.    Referral(s) or Consult(s): None at this time.  New Prescriptions   No medications on file    Medications administered during this visit: Ms. Konen had no medications administered during this visit.  Requested PM Follow-up: Return in 3 months (on 08/31/2016) for Med-Mgmt.  Future Appointments Date Time Provider Department Center  08/31/2016 9:20 AM Delano Metz, MD [facility] None    Primary Care Physician: No PCP Per Patient Location: [facility] Note by: Sydnee Levans. Laban Emperor, M.D, DABA, DABAPM, DABPM, DABIPP, FIPP  Pain Score Disclaimer: We use the NRS-11 scale. This is a self-reported, subjective measurement of pain severity with only modest accuracy. It is used primarily to  identify changes within a particular patient. It must be understood that outpatient pain scales are significantly less accurate that those used for research, where they can be applied under ideal controlled circumstances with minimal exposure to variables. In reality, the score is likely to be a combination of pain intensity and pain affect, where pain affect describes the degree of emotional arousal or changes in action readiness caused by the sensory experience of pain. Factors such as social and work situation, setting, emotional state, anxiety levels, expectation, and prior pain experience may influence pain perception and show large inter-individual differences that may also be affected by time variables.  Patient instructions provided during this appointment: Patient Instructions  You were given 3 scripts for Oxycodone, 1 script for Xanax, and 1 script for Melatonin.

## 2016-06-01 NOTE — Progress Notes (Signed)
Safety precautions to be maintained throughout the outpatient stay will include: orient to surroundings, keep bed in low position, maintain call bell within reach at all times, provide assistance with transfer out of bed and ambulation. Medication count is oxycodone 5 mg is 45/120 filled on 05/18/2016 and xanax 12/30 filled on 05/18/2016

## 2016-06-01 NOTE — Patient Instructions (Signed)
You were given 3 scripts for Oxycodone, 1 script for Xanax, and 1 script for Melatonin.

## 2016-08-25 ENCOUNTER — Ambulatory Visit: Payer: PRIVATE HEALTH INSURANCE | Admitting: Pain Medicine

## 2016-08-31 ENCOUNTER — Encounter: Payer: Self-pay | Admitting: Pain Medicine

## 2016-09-14 ENCOUNTER — Encounter: Payer: Self-pay | Admitting: Pain Medicine

## 2016-09-15 ENCOUNTER — Encounter: Payer: Self-pay | Admitting: Pain Medicine

## 2016-09-15 ENCOUNTER — Telehealth: Payer: Self-pay | Admitting: Pain Medicine

## 2016-09-15 ENCOUNTER — Ambulatory Visit: Payer: Self-pay | Attending: Pain Medicine | Admitting: Pain Medicine

## 2016-09-15 VITALS — BP 122/64 | HR 64 | Temp 98.2°F | Resp 16 | Ht <= 58 in | Wt 112.0 lb

## 2016-09-15 DIAGNOSIS — G90523 Complex regional pain syndrome I of lower limb, bilateral: Secondary | ICD-10-CM | POA: Insufficient documentation

## 2016-09-15 DIAGNOSIS — M79604 Pain in right leg: Secondary | ICD-10-CM | POA: Insufficient documentation

## 2016-09-15 DIAGNOSIS — Z888 Allergy status to other drugs, medicaments and biological substances status: Secondary | ICD-10-CM | POA: Insufficient documentation

## 2016-09-15 DIAGNOSIS — F411 Generalized anxiety disorder: Secondary | ICD-10-CM | POA: Insufficient documentation

## 2016-09-15 DIAGNOSIS — E559 Vitamin D deficiency, unspecified: Secondary | ICD-10-CM | POA: Insufficient documentation

## 2016-09-15 DIAGNOSIS — M79641 Pain in right hand: Secondary | ICD-10-CM | POA: Insufficient documentation

## 2016-09-15 DIAGNOSIS — G8929 Other chronic pain: Secondary | ICD-10-CM

## 2016-09-15 DIAGNOSIS — I509 Heart failure, unspecified: Secondary | ICD-10-CM | POA: Insufficient documentation

## 2016-09-15 DIAGNOSIS — Z87891 Personal history of nicotine dependence: Secondary | ICD-10-CM | POA: Insufficient documentation

## 2016-09-15 DIAGNOSIS — M79605 Pain in left leg: Secondary | ICD-10-CM | POA: Insufficient documentation

## 2016-09-15 DIAGNOSIS — K5900 Constipation, unspecified: Secondary | ICD-10-CM | POA: Insufficient documentation

## 2016-09-15 DIAGNOSIS — M79601 Pain in right arm: Secondary | ICD-10-CM

## 2016-09-15 DIAGNOSIS — M79602 Pain in left arm: Secondary | ICD-10-CM

## 2016-09-15 DIAGNOSIS — G894 Chronic pain syndrome: Secondary | ICD-10-CM | POA: Insufficient documentation

## 2016-09-15 DIAGNOSIS — K219 Gastro-esophageal reflux disease without esophagitis: Secondary | ICD-10-CM | POA: Insufficient documentation

## 2016-09-15 DIAGNOSIS — F1190 Opioid use, unspecified, uncomplicated: Secondary | ICD-10-CM

## 2016-09-15 DIAGNOSIS — K5903 Drug induced constipation: Secondary | ICD-10-CM

## 2016-09-15 DIAGNOSIS — Z79891 Long term (current) use of opiate analgesic: Secondary | ICD-10-CM

## 2016-09-15 DIAGNOSIS — T402X5A Adverse effect of other opioids, initial encounter: Secondary | ICD-10-CM | POA: Insufficient documentation

## 2016-09-15 DIAGNOSIS — F329 Major depressive disorder, single episode, unspecified: Secondary | ICD-10-CM | POA: Insufficient documentation

## 2016-09-15 DIAGNOSIS — G90513 Complex regional pain syndrome I of upper limb, bilateral: Secondary | ICD-10-CM | POA: Insufficient documentation

## 2016-09-15 DIAGNOSIS — Z9889 Other specified postprocedural states: Secondary | ICD-10-CM | POA: Insufficient documentation

## 2016-09-15 DIAGNOSIS — G4701 Insomnia due to medical condition: Secondary | ICD-10-CM

## 2016-09-15 DIAGNOSIS — Z79899 Other long term (current) drug therapy: Secondary | ICD-10-CM | POA: Insufficient documentation

## 2016-09-15 DIAGNOSIS — J449 Chronic obstructive pulmonary disease, unspecified: Secondary | ICD-10-CM | POA: Insufficient documentation

## 2016-09-15 MED ORDER — OXYCODONE HCL 5 MG PO TABS: 5.0000 mg | ORAL_TABLET | Freq: Four times a day (QID) | ORAL | 0 refills | Status: DC | PRN

## 2016-09-15 MED ORDER — MELATONIN 10 MG PO CAPS: 20.0000 mg | ORAL_CAPSULE | Freq: Every evening | ORAL | 2 refills | Status: DC | PRN

## 2016-09-15 MED ORDER — BENEFIBER PO POWD: ORAL | 99 refills | Status: DC

## 2016-09-15 MED ORDER — DOCUSATE SODIUM 100 MG PO CAPS: 200.0000 mg | ORAL_CAPSULE | Freq: Every evening | ORAL | 99 refills | Status: DC | PRN

## 2016-09-15 MED ORDER — BISACODYL 5 MG PO TBEC: 10.0000 mg | DELAYED_RELEASE_TABLET | Freq: Every evening | ORAL | 99 refills | Status: DC | PRN

## 2016-09-15 NOTE — Progress Notes (Signed)
Nursing Pain Medication Assessment:  Safety precautions to be maintained throughout the outpatient stay will include: orient to surroundings, keep bed in low position, maintain call bell within reach at all times, provide assistance with transfer out of bed and ambulation.  Medication Inspection Compliance: Pill count conducted under aseptic conditions, in front of the patient. Neither the pills nor the bottle was removed from the patient's sight at any time. Once count was completed pills were immediately returned to the patient in their original bottle. Pill Count: 0 of 120 pills remain Bottle Appearance: Standard pharmacy container. Clearly labeled. Medication: Oxycodone IR Filled Date: 3911 / 04 / 2017  Last dose 09-14-16

## 2016-09-15 NOTE — Telephone Encounter (Addendum)
Patient does not have PCP, I informed patient she needed to have one by next appt. She said she would try. Dr. Laban EmperorNaveira said she must have one to get more prescriptions. Please call patient and let her know this as she might take it more seriously from a nurse.

## 2016-09-15 NOTE — Telephone Encounter (Signed)
Called [place] [facility]- no answer on cell phone, no answer on home phone- left message about need to get a PCP or Dr Luis AbedNaverira will not be able to write any more prescriptions

## 2016-09-15 NOTE — Progress Notes (Signed)
Patient's Name: Elizabeth [name]  MRN: [id number]  Referring Provider: No ref. provider found  DOB: 01-Mar-1961  PCP: No Pcp Per Patient  DOS: 09/15/2016  Note by: Izan Miron A. Dossie Arbour, MD  Service setting: Ambulatory outpatient  Specialty: Interventional Pain Management  Location: [facility] (AMB) Pain Management Facility    Patient type: Established   Primary Reason(s) for Visit: Encounter for prescription drug management (Level of risk: moderate) CC: Hand Pain (right)  HPI  Elizabeth [name] is a 55 y.o. year old, female patient, who comes today for a medication management evaluation. She has Chronic low back pain; Generalized anxiety disorder; Encounter for therapeutic drug level monitoring; Long term current use of opiate analgesic; Uncomplicated opioid dependence ([place]); Opiate use (30 MME/Day); Chronic pain syndrome; Complex regional pain syndrome I of upper limb (Bilateral); Complex regional pain syndrome I of lower limb (Bilateral); Opiate analgesic use agreement exists; Presence of spinal cord stimulator; Chronic obstructive pulmonary disease (COPD) ([place]); Congestive heart failure ([place]); Vitamin D deficiency; Vitamin B12 deficiency; Chronic upper extremity pain (Bilateral); Chronic lower extremity pain (Bilateral); Long term prescription opiate use; Encounter for chronic pain management; Elevated sedimentation rate; Insomnia secondary to chronic pain; Lumbar facet syndrome (Bilateral); and Opioid-induced constipation (OIC) on her problem list. Her primarily concern today is the Hand Pain (right)  Pain Assessment: Self-Reported Pain Score: 3 /10             Reported level is compatible with observation.       Pain Type: Chronic pain Pain Location: Hand Pain Orientation: Right Pain Descriptors / Indicators: Aching Pain Frequency: Constant  Elizabeth [name] was last seen on 06/01/2016 for medication management. During today's appointment we reviewed Elizabeth [name]'s chronic pain status, as well as her outpatient  medication regimen.  The patient  reports that she does not use drugs. Her body mass index is 24.24 kg/m.  Further details on both, my assessment(s), as well as the proposed treatment plan, please see below.  Controlled Substance Pharmacotherapy Assessment REMS (Risk Evaluation and Mitigation Strategy)  Analgesic:Oxycodone IR 5 mg 1 tablet by mouth every 6 hours (20 mg/day) MME/day:30 mg/day Zenovia Jarred, RN  09/15/2016 11:20 AM  Signed Nursing Pain Medication Assessment:  Safety precautions to be maintained throughout the outpatient stay will include: orient to surroundings, keep bed in low position, maintain call bell within reach at all times, provide assistance with transfer out of bed and ambulation.  Medication Inspection Compliance: Pill count conducted under aseptic conditions, in front of the patient. Neither the pills nor the bottle was removed from the patient's sight at any time. Once count was completed pills were immediately returned to the patient in their original bottle. Pill Count: 0 of 120 pills remain Bottle Appearance: Standard pharmacy container. Clearly labeled. Medication: Oxycodone IR Filled Date: 27 / 04 / 2017  Last dose 09-14-16   Pharmacokinetics: Liberation and absorption (onset of action): WNL Distribution (time to peak effect): WNL Metabolism and excretion (duration of action): WNL         Pharmacodynamics: Desired effects: Analgesia: Elizabeth [name] reports >50% benefit. Functional ability: Patient reports that medication allows her to accomplish basic ADLs Clinically meaningful improvement in function (CMIF): Sustained CMIF goals met Perceived effectiveness: Described as relatively effective, allowing for increase in activities of daily living (ADL) Undesirable effects: Side-effects or Adverse reactions: None reported Monitoring: [place] PMP: Online review of the past 41-monthperiod conducted. Compliant with practice rules and regulations List of all UDS  test(s) done:  Lab Results  Component Value Date   TOXASSSELUR FINAL 03/18/2016   TOXASSSELUR FINAL 12/16/2015   TOXASSSELUR FINAL 09/23/2015   Last UDS on record: ToxAssure Select 13  Date Value Ref Range Status  03/18/2016 FINAL  Final    Comment:    ==================================================================== TOXASSURE SELECT 13 (MW) ==================================================================== Test                             Result       Flag       Units Drug Present and Declared for Prescription Verification   Oxycodone                      1380         EXPECTED   ng/mg creat   Oxymorphone                    582          EXPECTED   ng/mg creat   Noroxycodone                   3257         EXPECTED   ng/mg creat   Noroxymorphone                 129          EXPECTED   ng/mg creat    Sources of oxycodone are scheduled prescription medications.    Oxymorphone, noroxycodone, and noroxymorphone are expected    metabolites of oxycodone. Oxymorphone is also available as a    scheduled prescription medication. Drug Present not Declared for Prescription Verification   Oxazepam                       104          UNEXPECTED ng/mg creat    Oxazepam may be administered as a scheduled prescription    medication; it is also an expected metabolite of other    benzodiazepine drugs, including diazepam, chlordiazepoxide,    prazepam, clorazepate, halazepam, and temazepam. Drug Absent but Declared for Prescription Verification   Alprazolam                     Not Detected UNEXPECTED ng/mg creat ==================================================================== Test                      Result    Flag   Units      Ref Range   Creatinine              49               mg/dL      >=20 ==================================================================== Declared Medications:  The flagging and interpretation on this report are based on the  following declared medications.   Unexpected results may arise from  inaccuracies in the declared medications.  **Note: The testing scope of this panel includes these medications:  Alprazolam (Xanax)  Oxycodone  **Note: The testing scope of this panel does not include following  reported medications:  Albuterol  Polyethylene Glycol  Pramipexole (Mirapex) ==================================================================== For clinical consultation, please call [phone]. ====================================================================    UDS interpretation: Compliant          Medication Assessment Form: Reviewed. Patient indicates being compliant with therapy Treatment compliance: Compliant Risk Assessment Profile: Aberrant behavior: See prior evaluations. None observed or detected today Comorbid  factors increasing risk of overdose: See prior notes. No additional risks detected today Risk of substance use disorder (SUD): Low Opioid Risk Tool (ORT) Total Score: 0  Interpretation Table:  Score <3 = Low Risk for SUD  Score between 4-7 = Moderate Risk for SUD  Score >8 = High Risk for Opioid Abuse   Risk Mitigation Strategies:  Patient Counseling: Covered Patient-Prescriber Agreement (PPA): Present and active  Notification to other healthcare providers: Done  Pharmacologic Plan: No change in therapy, at this time  Laboratory Chemistry  Inflammation Markers Lab Results  Component Value Date   ESRSEDRATE 26 03/18/2016   CRP 1.2 (H) 03/18/2016   Renal Function Lab Results  Component Value Date   BUN 14 03/18/2016   CREATININE 0.71 03/18/2016   GFRAA >60 03/18/2016   GFRNONAA >60 03/18/2016   Hepatic Function Lab Results  Component Value Date   AST 18 03/18/2016   ALT 12 (L) 03/18/2016   ALBUMIN 4.1 03/18/2016   Electrolytes Lab Results  Component Value Date   NA 139 03/18/2016   K 3.8 03/18/2016   CL 109 03/18/2016   CALCIUM 9.0 03/18/2016   MG 2.2 03/18/2016   Pain Modulating  Vitamins Lab Results  Component Value Date   VD25OH 25.3 (L) 03/18/2016   VD125OH2TOT 76.2 03/18/2016   25OHVITD1 35 03/18/2016   25OHVITD2 1.6 03/18/2016   25OHVITD3 33 03/18/2016   VITAMINB12 212 03/18/2016   Coagulation Parameters Lab Results  Component Value Date   PLT 214 03/08/2014   Cardiovascular Lab Results  Component Value Date   HGB 13.0 03/08/2014   HCT 38.9 03/08/2014   Note: Lab results reviewed.  Recent Diagnostic Imaging Review  No results found. Note: Imaging results reviewed.          Meds  The patient has a current medication list which includes the following prescription(s): albuterol, bisacodyl, docusate sodium, melatonin, oxycodone, oxycodone, oxycodone, polyethylene glycol, and benefiber.  Current Outpatient Prescriptions on File Prior to Visit  Medication Sig  . albuterol (PROVENTIL HFA;VENTOLIN HFA) 108 (90 Base) MCG/ACT inhaler Inhale 2 puffs into the lungs every 6 (six) hours as needed for wheezing or shortness of breath.  . polyethylene glycol (MIRALAX / GLYCOLAX) packet Take by mouth. Reported on 12/16/2015   No current facility-administered medications on file prior to visit.    ROS  Constitutional: Denies any fever or chills Gastrointestinal: No reported hemesis, hematochezia, vomiting, or acute GI distress Musculoskeletal: Denies any acute onset joint swelling, redness, loss of ROM, or weakness Neurological: No reported episodes of acute onset apraxia, aphasia, dysarthria, agnosia, amnesia, paralysis, loss of coordination, or loss of consciousness  Allergies  Elizabeth [name] is allergic to lisinopril.  PFSH  Drug: Elizabeth [name]  reports that she does not use drugs. Alcohol:  reports that she does not drink alcohol. Tobacco:  reports that she has quit smoking. Her smoking use included Cigarettes. She smoked 0.50 packs per day. She has never used smokeless tobacco. Medical:  has a past medical history of Anxiety; Arm pain (07/25/2015); CHF  (congestive heart failure) (HCC); Congestive heart failure (HCC) (07/25/2015); COPD (chronic obstructive pulmonary disease) (HCC); Depression; Lower extremity pain (07/25/2015); Reflux; RSD (reflex sympathetic dystrophy); Spinal cord stimulator status; Vitamin B12 deficiency; and Vitamin D deficiency. Family: family history includes Cancer in her father; Cirrhosis in her mother.  Past Surgical History:  Procedure Laterality Date  . SPINAL CORD STIMULATOR IMPLANT     x 2   Constitutional Exam  General appearance: Well nourished,  well developed, and well hydrated. In no apparent acute distress Vitals:   09/15/16 1114 09/15/16 1116  BP:  122/64  Pulse: 64   Resp: 16   Temp: 98.2 F (36.8 C)   SpO2: 97%   Weight: 112 lb (50.8 kg)   Height: '4\' 9"'$  (1.448 m)    BMI Assessment: Estimated body mass index is 24.24 kg/m as calculated from the following:   Height as of this encounter: '4\' 9"'$  (1.448 m).   Weight as of this encounter: 112 lb (50.8 kg).  BMI interpretation table: BMI level Category Range association with higher incidence of chronic pain  <18 kg/m2 Underweight   18.5-24.9 kg/m2 Ideal body weight   25-29.9 kg/m2 Overweight Increased incidence by 20%  30-34.9 kg/m2 Obese (Class I) Increased incidence by 68%  35-39.9 kg/m2 Severe obesity (Class II) Increased incidence by 136%  >40 kg/m2 Extreme obesity (Class III) Increased incidence by 254%   BMI Readings from Last 4 Encounters:  09/15/16 24.24 kg/m  06/01/16 27.05 kg/m  03/18/16 27.05 kg/m  12/16/15 27.05 kg/m   Wt Readings from Last 4 Encounters:  09/15/16 112 lb (50.8 kg)  06/01/16 125 lb (56.7 kg)  03/18/16 125 lb (56.7 kg)  12/16/15 125 lb (56.7 kg)  Psych/Mental status: Alert, oriented x 3 (person, place, & time) Eyes: PERLA Respiratory: No evidence of acute respiratory distress  Cervical Spine Exam  Inspection: No masses, redness, or swelling Alignment: Symmetrical Functional ROM: Unrestricted  ROM Stability: No instability detected Muscle strength & Tone: Functionally intact Sensory: Unimpaired Palpation: Non-contributory  Upper Extremity (UE) Exam    Side: Right upper extremity  Side: Left upper extremity  Inspection: No masses, redness, swelling, or asymmetry  Inspection: No masses, redness, swelling, or asymmetry  Functional ROM: Unrestricted ROM          Functional ROM: Unrestricted ROM          Muscle strength & Tone: Functionally intact  Muscle strength & Tone: Functionally intact  Sensory: Unimpaired  Sensory: Unimpaired  Palpation: Non-contributory  Palpation: Non-contributory   Thoracic Spine Exam  Inspection: No masses, redness, or swelling Alignment: Symmetrical Functional ROM: Unrestricted ROM Stability: No instability detected Sensory: Unimpaired Muscle strength & Tone: Functionally intact Palpation: Non-contributory  Lumbar Spine Exam  Inspection: No masses, redness, or swelling Alignment: Symmetrical Functional ROM: Unrestricted ROM Stability: No instability detected Muscle strength & Tone: Functionally intact Sensory: Unimpaired Palpation: Non-contributory Provocative Tests: Lumbar Hyperextension and rotation test: evaluation deferred today       Patrick's Maneuver: evaluation deferred today              Gait & Posture Assessment  Ambulation: Unassisted Gait: Relatively normal for age and body habitus Posture: WNL   Lower Extremity Exam    Side: Right lower extremity  Side: Left lower extremity  Inspection: No masses, redness, swelling, or asymmetry  Inspection: No masses, redness, swelling, or asymmetry  Functional ROM: Unrestricted ROM          Functional ROM: Unrestricted ROM          Muscle strength & Tone: Functionally intact  Muscle strength & Tone: Functionally intact  Sensory: Unimpaired  Sensory: Unimpaired  Palpation: Non-contributory  Palpation: Non-contributory   Assessment  Primary Diagnosis & Pertinent Problem List: The  primary encounter diagnosis was Chronic pain syndrome. Diagnoses of Long term current use of opiate analgesic, Opiate use (30 MME/Day), Complex regional pain syndrome type 1 of both lower extremities, Complex regional pain syndrome type 1 of  both upper extremities, Chronic pain of both upper extremities, Insomnia secondary to chronic pain, and Opioid-induced constipation (OIC) were also pertinent to this visit.  Visit Diagnosis: 1. Chronic pain syndrome   2. Long term current use of opiate analgesic   3. Opiate use (30 MME/Day)   4. Complex regional pain syndrome type 1 of both lower extremities   5. Complex regional pain syndrome type 1 of both upper extremities   6. Chronic pain of both upper extremities   7. Insomnia secondary to chronic pain   8. Opioid-induced constipation (OIC)    Plan of Care  Pharmacotherapy (Medications Ordered): Meds ordered this encounter  Medications  . oxyCODONE (OXY IR/ROXICODONE) 5 MG immediate release tablet    Sig: Take 1 tablet (5 mg total) by mouth every 6 (six) hours as needed for severe pain.    Dispense:  120 tablet    Refill:  0    Do not place this medication, or any other prescription from our practice, on "Automatic Refill". Patient may have prescription filled one day early if pharmacy is closed on scheduled refill date. Do not fill until: 09/15/16 To last until: 10/15/16  . oxyCODONE (OXY IR/ROXICODONE) 5 MG immediate release tablet    Sig: Take 1 tablet (5 mg total) by mouth every 6 (six) hours as needed for severe pain.    Dispense:  120 tablet    Refill:  0    Do not place this medication, or any other prescription from our practice, on "Automatic Refill". Patient may have prescription filled one day early if pharmacy is closed on scheduled refill date. Do not fill until: 10/15/16 To last until: 11/14/16  . oxyCODONE (OXY IR/ROXICODONE) 5 MG immediate release tablet    Sig: Take 1 tablet (5 mg total) by mouth every 6 (six) hours as needed  for severe pain.    Dispense:  120 tablet    Refill:  0    Do not place this medication, or any other prescription from our practice, on "Automatic Refill". Patient may have prescription filled one day early if pharmacy is closed on scheduled refill date. Do not fill until: 11/14/16 To last until: 12/14/16  . Melatonin 10 MG CAPS    Sig: Take 20 mg by mouth at bedtime as needed.    Dispense:  60 capsule    Refill:  2    Do not place this medication, or any other prescription from our practice, on "Automatic Refill". Patient may have prescription filled one day early if pharmacy is closed on scheduled refill date.  . Wheat Dextrin (BENEFIBER) POWD    Sig: Stir 2 tsp. TID into 4-8 oz of any non-carbonated beverage or soft food (hot or cold)    Dispense:  500 g    Refill:  PRN    This is an OTC product. This prescription is to serve as a reminder to the patient as to our preference.  . docusate sodium (COLACE) 100 MG capsule    Sig: Take 2 capsules (200 mg total) by mouth at bedtime as needed for moderate constipation. Do not use longer than 7 days.    Dispense:  60 capsule    Refill:  PRN    Do not place this medication, or any other prescription from our practice, on "Automatic Refill". Patient may have prescription filled one day early if pharmacy is closed on scheduled refill date.  . bisacodyl (DULCOLAX) 5 MG EC tablet    Sig: Take 2 tablets (  10 mg total) by mouth at bedtime as needed for moderate constipation ((Hold for loose stool)).    Dispense:  100 tablet    Refill:  PRN    Do not place this medication, or any other prescription from our practice, on "Automatic Refill". Patient may have prescription filled one day early if pharmacy is closed on scheduled refill date.   New Prescriptions   BISACODYL (DULCOLAX) 5 MG EC TABLET    Take 2 tablets (10 mg total) by mouth at bedtime as needed for moderate constipation ((Hold for loose stool)).   DOCUSATE SODIUM (COLACE) 100 MG CAPSULE     Take 2 capsules (200 mg total) by mouth at bedtime as needed for moderate constipation. Do not use longer than 7 days.   WHEAT DEXTRIN (BENEFIBER) POWD    Stir 2 tsp. TID into 4-8 oz of any non-carbonated beverage or soft food (hot or cold)   Medications administered today: Elizabeth [name] had no medications administered during this visit. Lab-work, procedure(s), and/or referral(s): Orders Placed This Encounter  Procedures  . ToxASSURE Select 13 (MW), Urine   Imaging and/or referral(s): None  Interventional therapies: Planned, scheduled, and/or pending:   None at this time.    Considering:   Palliative stellate ganglion block under fluoroscopic guidance and IV sedation for the upper extremity complex regional pain syndrome.  Palliative Lumbar sympathetic block under fluoroscopic guidance and IV sedation for the lower extremity complex regional pain syndrome.  Diagnostic bilateral lumbar facet block under fluoroscopic guidance and IV sedation.    Palliative PRN treatment(s):   Palliative stellate ganglion block under fluoroscopic guidance and IV sedation for the upper extremity complex regional pain syndrome.  Palliative Lumbar sympathetic block under fluoroscopic guidance and IV sedation for the lower extremity complex regional pain syndrome.  Diagnostic bilateral lumbar facet block under fluoroscopic guidance and IV sedation.    Provider-requested follow-up: Return in about 3 months (around 12/14/2016) for Med-Mgmt.  Future Appointments Date Time Provider [place]  12/03/2016 8:30 AM Milinda Pointer, MD [facility] None   Primary Care Physician: No Pcp Per Patient Location: [facility] Note by: Kathlen Brunswick. Dossie Arbour, M.D, DABA, DABAPM, DABPM, DABIPP, FIPP Date: 09/15/16; Time: 12:40 PM  Pain Score Disclaimer: We use the NRS-11 scale. This is a self-reported, subjective measurement of pain severity with only modest accuracy. It is used primarily  to identify changes within a particular patient. It must be understood that outpatient pain scales are significantly less accurate that those used for research, where they can be applied under ideal controlled circumstances with minimal exposure to variables. In reality, the score is likely to be a combination of pain intensity and pain affect, where pain affect describes the degree of emotional arousal or changes in action readiness caused by the sensory experience of pain. Factors such as social and work situation, setting, emotional state, anxiety levels, expectation, and prior pain experience may influence pain perception and show large inter-individual differences that may also be affected by time variables.  Patient instructions provided during this appointment: Patient Instructions  Script at pharmacy for Melatonin, Dulcolax and Colace.  Script handed to patient for oxycodone x 3

## 2016-09-15 NOTE — Patient Instructions (Signed)
Script at pharmacy for Melatonin, Dulcolax and Colace.  Script handed to patient for oxycodone x 3

## 2016-09-22 LAB — TOXASSURE SELECT 13 (MW), URINE

## 2016-12-03 ENCOUNTER — Ambulatory Visit: Payer: PRIVATE HEALTH INSURANCE | Attending: Pain Medicine | Admitting: Pain Medicine

## 2016-12-03 ENCOUNTER — Encounter: Payer: Self-pay | Admitting: Pain Medicine

## 2016-12-03 VITALS — BP 141/78 | HR 86 | Temp 98.5°F | Resp 16 | Ht <= 58 in | Wt 112.0 lb

## 2016-12-03 DIAGNOSIS — M79601 Pain in right arm: Secondary | ICD-10-CM | POA: Insufficient documentation

## 2016-12-03 DIAGNOSIS — G90513 Complex regional pain syndrome I of upper limb, bilateral: Secondary | ICD-10-CM | POA: Insufficient documentation

## 2016-12-03 DIAGNOSIS — K5900 Constipation, unspecified: Secondary | ICD-10-CM | POA: Insufficient documentation

## 2016-12-03 DIAGNOSIS — G4701 Insomnia due to medical condition: Secondary | ICD-10-CM

## 2016-12-03 DIAGNOSIS — Z79899 Other long term (current) drug therapy: Secondary | ICD-10-CM | POA: Insufficient documentation

## 2016-12-03 DIAGNOSIS — J449 Chronic obstructive pulmonary disease, unspecified: Secondary | ICD-10-CM | POA: Insufficient documentation

## 2016-12-03 DIAGNOSIS — Z87891 Personal history of nicotine dependence: Secondary | ICD-10-CM | POA: Insufficient documentation

## 2016-12-03 DIAGNOSIS — K219 Gastro-esophageal reflux disease without esophagitis: Secondary | ICD-10-CM | POA: Insufficient documentation

## 2016-12-03 DIAGNOSIS — F1190 Opioid use, unspecified, uncomplicated: Secondary | ICD-10-CM

## 2016-12-03 DIAGNOSIS — M79602 Pain in left arm: Secondary | ICD-10-CM | POA: Insufficient documentation

## 2016-12-03 DIAGNOSIS — Z9889 Other specified postprocedural states: Secondary | ICD-10-CM | POA: Insufficient documentation

## 2016-12-03 DIAGNOSIS — E559 Vitamin D deficiency, unspecified: Secondary | ICD-10-CM | POA: Insufficient documentation

## 2016-12-03 DIAGNOSIS — I509 Heart failure, unspecified: Secondary | ICD-10-CM | POA: Insufficient documentation

## 2016-12-03 DIAGNOSIS — G894 Chronic pain syndrome: Secondary | ICD-10-CM | POA: Insufficient documentation

## 2016-12-03 DIAGNOSIS — F411 Generalized anxiety disorder: Secondary | ICD-10-CM | POA: Insufficient documentation

## 2016-12-03 DIAGNOSIS — G8929 Other chronic pain: Secondary | ICD-10-CM

## 2016-12-03 DIAGNOSIS — M79605 Pain in left leg: Secondary | ICD-10-CM | POA: Insufficient documentation

## 2016-12-03 DIAGNOSIS — M79604 Pain in right leg: Secondary | ICD-10-CM | POA: Insufficient documentation

## 2016-12-03 DIAGNOSIS — F329 Major depressive disorder, single episode, unspecified: Secondary | ICD-10-CM | POA: Insufficient documentation

## 2016-12-03 DIAGNOSIS — Z79891 Long term (current) use of opiate analgesic: Secondary | ICD-10-CM | POA: Insufficient documentation

## 2016-12-03 MED ORDER — OXYCODONE HCL 5 MG PO TABS: 5.0000 mg | ORAL_TABLET | Freq: Four times a day (QID) | ORAL | 0 refills | Status: DC | PRN

## 2016-12-03 MED ORDER — MELATONIN 10 MG PO CAPS: 20.0000 mg | ORAL_CAPSULE | Freq: Every evening | ORAL | 2 refills | Status: DC | PRN

## 2016-12-03 NOTE — Progress Notes (Signed)
Patient's Name: Elizabeth [name]  MRN: [id number]  Referring Provider: No ref. provider found  DOB: September 10, 1961  PCP: No Pcp Per Patient  DOS: 12/03/2016  Note by: Beatriz Chancellor A. Dossie Arbour, MD  Service setting: Ambulatory outpatient  Specialty: Interventional Pain Management  Location: [facility] (AMB) Pain Management Facility    Patient type: Established   Primary Reason(s) for Visit: Encounter for prescription drug management (Level of risk: moderate) CC: Arm Pain (left)  HPI  Elizabeth [name] is a 56 y.o. year old, female patient, who comes today for a medication management evaluation. She has Chronic low back pain; Generalized anxiety disorder; Encounter for therapeutic drug level monitoring; Long term current use of opiate analgesic; Uncomplicated opioid dependence ([place]); Opiate use (30 MME/Day); Chronic pain syndrome; Complex regional pain syndrome I of upper limb (Bilateral); Complex regional pain syndrome I of lower limb (Bilateral); Opiate analgesic use agreement exists; Presence of spinal cord stimulator; Chronic obstructive pulmonary disease (COPD) ([place]); Congestive heart failure ([place]); Vitamin D deficiency; Vitamin B12 deficiency; Chronic upper extremity pain (Bilateral); Chronic lower extremity pain (Bilateral); Long term prescription opiate use; Encounter for chronic pain management; Elevated sedimentation rate; Insomnia secondary to chronic pain; Lumbar facet syndrome (Bilateral); and Opioid-induced constipation (OIC) on her problem list. Her primarily concern today is the Arm Pain (left)  Pain Assessment: Self-Reported Pain Score: 3 /10             Reported level is compatible with observation.       Pain Type: Chronic pain Pain Location: Arm Pain Orientation: Right Pain Descriptors / Indicators: Burning Pain Frequency: Constant  Elizabeth [name] was last scheduled for an appointment on 09/15/2016 for medication management. During today's appointment we reviewed Elizabeth [name]'s chronic pain status, as well  as her outpatient medication regimen.  The patient  reports that she does not use drugs. Her body mass index is 24.24 kg/m.  Further details on both, my assessment(s), as well as the proposed treatment plan, please see below.  Controlled Substance Pharmacotherapy Assessment REMS (Risk Evaluation and Mitigation Strategy)  Analgesic:Oxycodone IR 5 mg 1 tablet by mouth every 6 hours (20 mg/day) MME/day:30 mg/day  Elizabeth Martins, RN  12/03/2016  8:44 AM  Sign at close encounter Nursing Pain Medication Assessment:  Safety precautions to be maintained throughout the outpatient stay will include: orient to surroundings, keep bed in low position, maintain call bell within reach at all times, provide assistance with transfer out of bed and ambulation.  Medication Inspection Compliance: Pill count conducted under aseptic conditions, in front of the patient. Neither the pills nor the bottle was removed from the patient's sight at any time. Once count was completed pills were immediately returned to the patient in their original bottle.  Medication: Oxycodone IR Pill/Patch Count: 29 of 120 pills remain Bottle Appearance: Standard pharmacy container. Clearly labeled. Filled Date: 02 /03/ 2018 Last Medication intake:  Today   Pharmacokinetics: Liberation and absorption (onset of action): WNL Distribution (time to peak effect): WNL Metabolism and excretion (duration of action): WNL         Pharmacodynamics: Desired effects: Analgesia: [name] reports >50% benefit. Functional ability: Patient reports that medication allows her to accomplish basic ADLs Clinically meaningful improvement in function (CMIF): Sustained CMIF goals met Perceived effectiveness: Described as relatively effective, allowing for increase in activities of daily living (ADL) Undesirable effects: Side-effects or Adverse reactions: None reported Monitoring: [place] PMP: Online review of the past 43-monthperiod conducted.  Compliant with practice rules and regulations List  of all UDS test(s) done:  Lab Results  Component Value Date   TOXASSSELUR FINAL 09/15/2016   TOXASSSELUR FINAL 03/18/2016   TOXASSSELUR FINAL 12/16/2015   TOXASSSELUR FINAL 09/23/2015   Last UDS on record: Tox[employer]ure Select 13  Date Value Ref Range Status  09/15/2016 FINAL  Final    Comment:    ==================================================================== TOXASSURE SELECT 13 (MW) ==================================================================== Test                             Result       Flag       Units Drug Present and Declared for Prescription Verification   Oxycodone                      3367         EXPECTED   ng/mg creat   Oxymorphone                    3380         EXPECTED   ng/mg creat   Noroxycodone                   4480         EXPECTED   ng/mg creat   Noroxymorphone                 745          EXPECTED   ng/mg creat    Sources of oxycodone are scheduled prescription medications.    Oxymorphone, noroxycodone, and noroxymorphone are expected    metabolites of oxycodone. Oxymorphone is also available as a    scheduled prescription medication. ==================================================================== Test                      Result    Flag   Units      Ref Range   Creatinine              93               mg/dL      >=20 ==================================================================== Declared Medications:  The flagging and interpretation on this report are based on the  following declared medications.  Unexpected results may arise from  inaccuracies in the declared medications.  **Note: The testing scope of this panel includes these medications:  Oxycodone (Roxicodone)  **Note: The testing scope of this panel does not include following  reported medications:  Albuterol (Proventil)  Docusate (Colace)  Docusate (Dulcolax)  Melatonin  Polyethylene Glycol (MiraLAX)   Supplement ==================================================================== For clinical consultation, please call [phone]. ====================================================================    UDS interpretation: Compliant          Medication Assessment Form: Reviewed. Patient indicates being compliant with therapy Treatment compliance: Compliant Risk Assessment Profile: Aberrant behavior: See prior evaluations. None observed or detected today Comorbid factors increasing risk of overdose: See prior notes. No additional risks detected today Risk of substance use disorder (SUD): Low Opioid Risk Tool (ORT) Total Score: 0  Interpretation Table:  Score <3 = Low Risk for SUD  Score between 4-7 = Moderate Risk for SUD  Score >8 = High Risk for Opioid Abuse   Risk Mitigation Strategies:  Patient Counseling: Covered Patient-Prescriber Agreement (PPA): Present and active  Notification to other healthcare providers: Done  Pharmacologic Plan: No change in therapy, at this time  Laboratory Chemistry  Inflammation Markers Lab Results  Component Value  Date   ESRSEDRATE 26 03/18/2016   CRP 1.2 (H) 03/18/2016   Renal Function Lab Results  Component Value Date   BUN 14 03/18/2016   CREATININE 0.71 03/18/2016   GFRAA >60 03/18/2016   GFRNONAA >60 03/18/2016   Hepatic Function Lab Results  Component Value Date   AST 18 03/18/2016   ALT 12 (L) 03/18/2016   ALBUMIN 4.1 03/18/2016   Electrolytes Lab Results  Component Value Date   NA 139 03/18/2016   K 3.8 03/18/2016   CL 109 03/18/2016   CALCIUM 9.0 03/18/2016   MG 2.2 03/18/2016   Pain Modulating Vitamins Lab Results  Component Value Date   VD25OH 25.3 (L) 03/18/2016   VD125OH2TOT 76.2 03/18/2016   25OHVITD1 35 03/18/2016   25OHVITD2 1.6 03/18/2016   25OHVITD3 33 03/18/2016   VITAMINB12 212 03/18/2016   Coagulation Parameters Lab Results  Component Value Date   PLT 214 03/08/2014   Cardiovascular Lab  Results  Component Value Date   HGB 13.0 03/08/2014   HCT 38.9 03/08/2014   Note: Lab results reviewed.  Recent Diagnostic Imaging Review  No results found. Note: Imaging results reviewed.          Meds  The patient has a current medication list which includes the following prescription(s): albuterol, bisacodyl, docusate sodium, melatonin, oxycodone, oxycodone, oxycodone, polyethylene glycol, and benefiber.  Current Outpatient Prescriptions on File Prior to Visit  Medication Sig  . albuterol (PROVENTIL HFA;VENTOLIN HFA) 108 (90 Base) MCG/ACT inhaler Inhale 2 puffs into the lungs every 6 (six) hours as needed for wheezing or shortness of breath.  . bisacodyl (DULCOLAX) 5 MG EC tablet Take 2 tablets (10 mg total) by mouth at bedtime as needed for moderate constipation ((Hold for loose stool)).  Marland Kitchen docusate sodium (COLACE) 100 MG capsule Take 2 capsules (200 mg total) by mouth at bedtime as needed for moderate constipation. Do not use longer than 7 days.  . polyethylene glycol (MIRALAX / GLYCOLAX) packet Take by mouth. Reported on 12/16/2015  . Wheat Dextrin (BENEFIBER) POWD Stir 2 tsp. TID into 4-8 oz of any non-carbonated beverage or soft food (hot or cold)   No current facility-administered medications on file prior to visit.    ROS  Constitutional: Denies any fever or chills Gastrointestinal: No reported hemesis, hematochezia, vomiting, or acute GI distress Musculoskeletal: Denies any acute onset joint swelling, redness, loss of ROM, or weakness Neurological: No reported episodes of acute onset apraxia, aphasia, dysarthria, agnosia, amnesia, paralysis, loss of coordination, or loss of consciousness  Allergies  Ms. Shreffler is allergic to lisinopril.  PFSH  Drug: Ms. Kachel  reports that she does not use drugs. Alcohol:  reports that she does not drink alcohol. Tobacco:  reports that she has quit smoking. Her smoking use included Cigarettes. She smoked 0.50 packs per day. She has  never used smokeless tobacco. Medical:  has a past medical history of Anxiety; Arm pain (07/25/2015); CHF (congestive heart failure) ([place]); Congestive heart failure ([place]) (07/25/2015); COPD (chronic obstructive pulmonary disease) ([place]); Depression; Lower extremity pain (07/25/2015); Reflux; RSD (reflex sympathetic dystrophy); Spinal cord stimulator status; Vitamin B12 deficiency; and Vitamin D deficiency. Family: family history includes Cancer in her father; Cirrhosis in her mother.  Past Surgical History:  Procedure Laterality Date  . SPINAL CORD STIMULATOR IMPLANT     x 2   Constitutional Exam  General appearance: Well nourished, well developed, and well hydrated. In no apparent acute distress Vitals:   12/03/16 0839  BP: (!) 141/78  Pulse: 86  Resp: 16  Temp: 98.5 F (36.9 C)  TempSrc: Oral  SpO2: 97%  Weight: 112 lb (50.8 kg)  Height: '4\' 9"'$  (1.448 m)   BMI Assessment: Estimated body mass index is 24.24 kg/m as calculated from the following:   Height as of this encounter: '4\' 9"'$  (1.448 m).   Weight as of this encounter: 112 lb (50.8 kg).  BMI interpretation table: BMI level Category Range association with higher incidence of chronic pain  <18 kg/m2 Underweight   18.5-24.9 kg/m2 Ideal body weight   25-29.9 kg/m2 Overweight Increased incidence by 20%  30-34.9 kg/m2 Obese (Class I) Increased incidence by 68%  35-39.9 kg/m2 Severe obesity (Class II) Increased incidence by 136%  >40 kg/m2 Extreme obesity (Class III) Increased incidence by 254%   BMI Readings from Last 4 Encounters:  12/03/16 24.24 kg/m  09/15/16 24.24 kg/m  06/01/16 27.05 kg/m  03/18/16 27.05 kg/m   Wt Readings from Last 4 Encounters:  12/03/16 112 lb (50.8 kg)  09/15/16 112 lb (50.8 kg)  06/01/16 125 lb (56.7 kg)  03/18/16 125 lb (56.7 kg)  Psych/Mental status: Alert, oriented x 3 (person, place, & time)       Eyes: PERLA Respiratory: No evidence of acute respiratory distress  Cervical Spine Exam   Inspection: No masses, redness, or swelling Alignment: Symmetrical Functional ROM: Unrestricted ROM Stability: No instability detected Muscle strength & Tone: Functionally intact Sensory: Unimpaired Palpation: Non-contributory  Upper Extremity (UE) Exam    Side: Right upper extremity  Side: Left upper extremity  Inspection: No masses, redness, swelling, or asymmetry. No contractures  Inspection: No masses, redness, swelling, or asymmetry. No contractures  Functional ROM: Unrestricted ROM          Functional ROM: Unrestricted ROM          Muscle strength & Tone: Functionally intact  Muscle strength & Tone: Functionally intact  Sensory: Unimpaired  Sensory: Unimpaired  Palpation: Euthermic  Palpation: Euthermic  Specialized Test(s): Deferred         Specialized Test(s): Deferred          Thoracic Spine Exam  Inspection: No masses, redness, or swelling Alignment: Symmetrical Functional ROM: Unrestricted ROM Stability: No instability detected Sensory: Unimpaired Muscle strength & Tone: Functionally intact Palpation: Non-contributory  Lumbar Spine Exam  Inspection: No masses, redness, or swelling Alignment: Symmetrical Functional ROM: Unrestricted ROM Stability: No instability detected Muscle strength & Tone: Functionally intact Sensory: Unimpaired Palpation: Non-contributory Provocative Tests: Lumbar Hyperextension and rotation test: evaluation deferred today       Patrick's Maneuver: evaluation deferred today              Gait & Posture Assessment  Ambulation: Unassisted Gait: Relatively normal for age and body habitus Posture: WNL   Lower Extremity Exam    Side: Right lower extremity  Side: Left lower extremity  Inspection: No masses, redness, swelling, or asymmetry. No contractures  Inspection: No masses, redness, swelling, or asymmetry. No contractures  Functional ROM: Unrestricted ROM          Functional ROM: Unrestricted ROM          Muscle strength & Tone:  Functionally intact  Muscle strength & Tone: Functionally intact  Sensory: Unimpaired  Sensory: Unimpaired  Palpation: No palpable anomalies  Palpation: No palpable anomalies   Assessment  Primary Diagnosis & Pertinent Problem List: The primary encounter diagnosis was Chronic pain syndrome. Diagnoses of Complex regional pain syndrome type 1 of both upper extremities, Long term prescription  opiate use, Opiate use (30 MME/Day), Chronic pain of both upper extremities, and Insomnia secondary to chronic pain were also pertinent to this visit.  Status Diagnosis  Controlled Controlled Controlled 1. Chronic pain syndrome   2. Complex regional pain syndrome type 1 of both upper extremities   3. Long term prescription opiate use   4. Opiate use (30 MME/Day)   5. Chronic pain of both upper extremities   6. Insomnia secondary to chronic pain      Plan of Care  Pharmacotherapy (Medications Ordered): Meds ordered this encounter  Medications  . oxyCODONE (OXY IR/ROXICODONE) 5 MG immediate release tablet    Sig: Take 1 tablet (5 mg total) by mouth every 6 (six) hours as needed for severe pain.    Dispense:  120 tablet    Refill:  0    Do not place this medication, or any other prescription from our practice, on "Automatic Refill". Patient may have prescription filled one day early if pharmacy is closed on scheduled refill date. Do not fill until: 01/13/17 To last until: 02/12/17  . oxyCODONE (OXY IR/ROXICODONE) 5 MG immediate release tablet    Sig: Take 1 tablet (5 mg total) by mouth every 6 (six) hours as needed for severe pain.    Dispense:  120 tablet    Refill:  0    Do not place this medication, or any other prescription from our practice, on "Automatic Refill". Patient may have prescription filled one day early if pharmacy is closed on scheduled refill date. Do not fill until: 02/12/17 To last until: 03/14/17  . oxyCODONE (OXY IR/ROXICODONE) 5 MG immediate release tablet    Sig: Take 1  tablet (5 mg total) by mouth every 6 (six) hours as needed for severe pain.    Dispense:  120 tablet    Refill:  0    Do not place this medication, or any other prescription from our practice, on "Automatic Refill". Patient may have prescription filled one day early if pharmacy is closed on scheduled refill date. Do not fill until: 12/14/16 To last until: 01/13/17  . Melatonin 10 MG CAPS    Sig: Take 20 mg by mouth at bedtime as needed.    Dispense:  60 capsule    Refill:  2    Do not place this medication, or any other prescription from our practice, on "Automatic Refill". Patient may have prescription filled one day early if pharmacy is closed on scheduled refill date.   New Prescriptions   No medications on file   Medications administered today: Ms. Banbury had no medications administered during this visit. Lab-work, procedure(s), and/or referral(s): No orders of the defined types were placed in this encounter.  Imaging and/or referral(s): None  Interventional therapies: Planned, scheduled, and/or pending:   Not at this time.   Considering:   Palliative stellate ganglion block for the upper extremity complex regional pain syndrome.  Palliative Lumbar sympathetic block for the lower extremity complex regional pain syndrome.  Diagnostic bilateral lumbar facet block   Palliative PRN treatment(s):   Palliative stellate ganglion block for the upper extremity complex regional pain syndrome.  Palliative Lumbar sympathetic block for the lower extremity complex regional pain syndrome.  Diagnostic bilateral lumbar facet block   Provider-requested follow-up: Return in about 3 months (around 03/02/2017) for (Nurse Practitioner) Med-Mgmt.  No future appointments. Primary Care Physician: No Pcp Per Patient Location: [facility] Note by: Marlyn Tondreau A. Dossie Arbour, M.D, DABA, DABAPM, DABPM, DABIPP, FIPP  Date: 12/03/2016; Time: 3:18 PM  Pain Score Disclaimer: We  use the NRS-11 scale. This is a self-reported, subjective measurement of pain severity with only modest accuracy. It is used primarily to identify changes within a particular patient. It must be understood that outpatient pain scales are significantly less accurate that those used for research, where they can be applied under ideal controlled circumstances with minimal exposure to variables. In reality, the score is likely to be a combination of pain intensity and pain affect, where pain affect describes the degree of emotional arousal or changes in action readiness caused by the sensory experience of pain. Factors such as social and work situation, setting, emotional state, anxiety levels, expectation, and prior pain experience may influence pain perception and show large inter-individual differences that may also be affected by time variables.  Patient instructions provided during this appointment: Patient Instructions   Pain Score  Introduction: The pain score used by this practice is the Verbal Numerical Rating Scale (VNRS-11). This is an 11-point scale. It is for adults and children 10 years or older. There are significant differences in how the pain score is reported, used, and applied. Forget everything you learned in the past and learn this scoring system.  General Information: The scale should reflect your current level of pain. Unless you are specifically asked for the level of your worst pain, or your average pain. If you are asked for one of these two, then it should be understood that it is over the past 24 hours.  Basic Activities of Daily Living (ADL): Personal hygiene, dressing, eating, transferring, and using restroom.  Instructions: Most patients tend to report their level of pain as a combination of two factors, their physical pain and their psychosocial pain. This last one is also known as "suffering" and it is reflection of how physical pain affects you socially and psychologically.  From now on, report them separately. From this point on, when asked to report your pain level, report only your physical pain. Use the following table for reference.  Pain Clinic Pain Levels (0-5/10)  Pain Level Score Description  No Pain 0   Mild pain 1 Nagging, annoying, but does not interfere with basic activities of daily living (ADL). Patients are able to eat, bathe, get dressed, toileting (being able to get on and off the toilet and perform personal hygiene functions), transfer (move in and out of bed or a chair without assistance), and maintain continence (able to control bladder and bowel functions). Blood pressure and heart rate are unaffected. A normal heart rate for a healthy adult ranges from 60 to 100 bpm (beats per minute).   Mild to moderate pain 2 Noticeable and distracting. Impossible to hide from other people. More frequent flare-ups. Still possible to adapt and function close to normal. It can be very annoying and may have occasional stronger flare-ups. With discipline, patients may get used to it and adapt.   Moderate pain 3 Interferes significantly with activities of daily living (ADL). It becomes difficult to feed, bathe, get dressed, get on and off the toilet or to perform personal hygiene functions. Difficult to get in and out of bed or a chair without assistance. Very distracting. With effort, it can be ignored when deeply involved in activities.   Moderately severe pain 4 Impossible to ignore for more than a few minutes. With effort, patients may still be able to manage work or participate in some social activities. Very difficult to concentrate. Signs of autonomic nervous system  discharge are evident: dilated pupils (mydriasis); mild sweating (diaphoresis); sleep interference. Heart rate becomes elevated (>115 bpm). Diastolic blood pressure (lower number) rises above 100 mmHg. Patients find relief in laying down and not moving.   Severe pain 5 Intense and extremely  unpleasant. Associated with frowning face and frequent crying. Pain overwhelms the senses.  Ability to do any activity or maintain social relationships becomes significantly limited. Conversation becomes difficult. Pacing back and forth is common, as getting into a comfortable position is nearly impossible. Pain wakes you up from deep sleep. Physical signs will be obvious: pupillary dilation; increased sweating; goosebumps; brisk reflexes; cold, clammy hands and feet; nausea, vomiting or dry heaves; loss of appetite; significant sleep disturbance with inability to fall asleep or to remain asleep. When persistent, significant weight loss is observed due to the complete loss of appetite and sleep deprivation.  Blood pressure and heart rate becomes significantly elevated. Caution: If elevated blood pressure triggers a pounding headache associated with blurred vision, then the patient should immediately seek attention at an urgent or emergency care unit, as these may be signs of an impending stroke.    Emergency Department Pain Levels (6-10/10)  Emergency Room Pain 6 Severely limiting. Requires emergency care and should not be seen or managed at an outpatient pain management facility. Communication becomes difficult and requires great effort. Assistance to reach the emergency department may be required. Facial flushing and profuse sweating along with potentially dangerous increases in heart rate and blood pressure will be evident.   Distressing pain 7 Self-care is very difficult. Assistance is required to transport, or use restroom. Assistance to reach the emergency department will be required. Tasks requiring coordination, such as bathing and getting dressed become very difficult.   Disabling pain 8 Self-care is no longer possible. At this level, pain is disabling. The individual is unable to do even the most "basic" activities such as walking, eating, bathing, dressing, transferring to a bed, or toileting.  Fine motor skills are lost. It is difficult to think clearly.   Incapacitating pain 9 Pain becomes incapacitating. Thought processing is no longer possible. Difficult to remember your own name. Control of movement and coordination are lost.   The worst pain imaginable 10 At this level, most patients pass out from pain. When this level is reached, collapse of the autonomic nervous system occurs, leading to a sudden drop in blood pressure and heart rate. This in turn results in a temporary and dramatic drop in blood flow to the brain, leading to a loss of consciousness. Fainting is one of the body's self defense mechanisms. Passing out puts the brain in a calmed state and causes it to shut down for a while, in order to begin the healing process.    Summary: 1. Refer to this scale when providing [place] with your pain level. 2. Be accurate and careful when reporting your pain level. This will help with your care. 3. Over-reporting your pain level will lead to loss of credibility. 4. Even a level of 1/10 means that there is pain and will be treated at our facility. 5. High, inaccurate reporting will be documented as "Symptom Exaggeration", leading to loss of credibility and suspicions of possible secondary gains such as obtaining more narcotics, or wanting to appear disabled, for fraudulent reasons. 6. Only pain levels of 5 or below will be seen at our facility. 7. Pain levels of 6 and above will be sent to the Emergency Department and the appointment cancelled. _____________________________________________________________________________________________

## 2016-12-03 NOTE — Patient Instructions (Signed)

## 2016-12-03 NOTE — Progress Notes (Signed)
Nursing Pain Medication Assessment:  Safety precautions to be maintained throughout the outpatient stay will include: orient to surroundings, keep bed in low position, maintain call bell within reach at all times, provide assistance with transfer out of bed and ambulation.  Medication Inspection Compliance: Pill count conducted under aseptic conditions, in front of the patient. Neither the pills nor the bottle was removed from the patient's sight at any time. Once count was completed pills were immediately returned to the patient in their original bottle.  Medication: Oxycodone IR Pill/Patch Count: 29 of 120 pills remain Bottle Appearance: Standard pharmacy container. Clearly labeled. Filled Date: 02 /03/ 2018 Last Medication intake:  Today

## 2017-02-24 NOTE — Progress Notes (Signed)
Patient's Name: Elizabeth [name]  MRN: [id number]  Referring Provider: No ref. provider found  DOB: 03-Jul-1961  PCP: Patient, No Pcp Per  DOS: 03/02/2017  Note by: Vevelyn Francois NP  Service setting: Ambulatory outpatient  Specialty: Interventional Pain Management  Location: [facility] (AMB) Pain Management Facility    Patient type: Established    Primary Reason(s) for Visit: Encounter for prescription drug management (Level of risk: moderate) CC: Arm Pain (right to right hand)  HPI  Elizabeth [name] is a 56 y.o. year old, female patient, who comes today for a medication management evaluation. She has Chronic low back pain; Generalized anxiety disorder; Encounter for therapeutic drug level monitoring; Long term current use of opiate analgesic; Uncomplicated opioid dependence ([place]); Opiate use (30 MME/Day); Chronic pain syndrome; Complex regional pain syndrome I of upper limb (Bilateral); Complex regional pain syndrome I of lower limb (Bilateral); Opiate analgesic use agreement exists; Presence of spinal cord stimulator; Chronic obstructive pulmonary disease (COPD) ([place]); Congestive heart failure ([place]); Vitamin D deficiency; Vitamin B12 deficiency; Chronic upper extremity pain (Bilateral); Chronic lower extremity pain (Bilateral); Long term prescription opiate use; Encounter for chronic pain management; Elevated sedimentation rate; Insomnia secondary to chronic pain; Lumbar facet syndrome (Bilateral); and Opioid-induced constipation (OIC) on her problem list. Her primarily concern today is the Arm Pain (right to right hand)  Pain Assessment: Self-Reported Pain Score: 3 /10             Reported level is compatible with observation.       Pain Type: Chronic pain Pain Location: Arm Pain Orientation: Right Pain Descriptors / Indicators: Aching, Constant ([place]) Pain Frequency: Constant  Elizabeth [name] was last scheduled for an appointment on 12/03/16 or medication management. During today's appointment we  reviewed Elizabeth [name]'s chronic pain status, as well as her outpatient medication regimen. She as chronic right arm pain. She has RDS. She states that this is related to a past CTR. She continues to have numbness, tingling and weakness in her right hand.  She is a history of constipation but her current OTC regimen is effective and she denies any straining and has regular bowel movements.   The patient  reports that she does not use drugs. Her body mass index is 24.24 kg/m.  Further details on both, my assessment(s), as well as the proposed treatment plan, please see below.  Controlled Substance Pharmacotherapy Assessment REMS (Risk Evaluation and Mitigation Strategy)  Analgesic:Oxycodone IR 5 mg 1 tablet by mouth every 6 hours (20 mg/day) MME/day:30 mg/day  Ignatius Specking, RN  03/02/2017  9:26 AM  Sign at close encounter Nursing Pain Medication Assessment:  Safety precautions to be maintained throughout the outpatient stay will include: orient to surroundings, keep bed in low position, maintain call bell within reach at all times, provide assistance with transfer out of bed and ambulation.  Medication Inspection Compliance: Pill count conducted under aseptic conditions, in front of the patient. Neither the pills nor the bottle was removed from the patient's sight at any time. Once count was completed pills were immediately returned to the patient in their original bottle.  Medication: See above Pill/Patch Count: 44 of 120 pills remain Pill/Patch Appearance: Markings consistent with prescribed medication Bottle Appearance: Standard pharmacy container. Clearly labeled. Filled Date: 05 / 04 / 2018 Last Medication intake:  Today   Pharmacokinetics: Liberation and absorption (onset of action): WNL Distribution (time to peak effect): WNL Metabolism and excretion (duration of action): WNL  Pharmacodynamics: Desired effects: Analgesia: Elizabeth [name] reports >50% benefit. Functional  ability: Patient reports that medication allows her to accomplish basic ADLs Clinically meaningful improvement in function (CMIF): Sustained CMIF goals met Perceived effectiveness: Described as relatively effective, allowing for increase in activities of daily living (ADL) Undesirable effects: Side-effects or Adverse reactions: None reported Monitoring:  PMP: Online review of the past 73-month period conducted. Compliant with practice rules and regulations List of all UDS test(s) done:  Lab Results  Component Value Date   TOXASSSELUR FINAL 09/15/2016   TOXASSSELUR FINAL 03/18/2016   TOXASSSELUR FINAL 12/16/2015   TOXASSSELUR FINAL 09/23/2015   Last UDS on record: ToxAssure Select 13  Date Value Ref Range Status  09/15/2016 FINAL  Final    Comment:    ==================================================================== TOXASSURE SELECT 13 (MW) ==================================================================== Test                             Result       Flag       Units Drug Present and Declared for Prescription Verification   Oxycodone                      3367         EXPECTED   ng/mg creat   Oxymorphone                    3380         EXPECTED   ng/mg creat   Noroxycodone                   4480         EXPECTED   ng/mg creat   Noroxymorphone                 745          EXPECTED   ng/mg creat    Sources of oxycodone are scheduled prescription medications.    Oxymorphone, noroxycodone, and noroxymorphone are expected    metabolites of oxycodone. Oxymorphone is also available as a    scheduled prescription medication. ==================================================================== Test                      Result    Flag   Units      Ref Range   Creatinine              93               mg/dL      >=05 ==================================================================== Declared Medications:  The flagging and interpretation on this report are based on the  following  declared medications.  Unexpected results may arise from  inaccuracies in the declared medications.  **Note: The testing scope of this panel includes these medications:  Oxycodone (Roxicodone)  **Note: The testing scope of this panel does not include following  reported medications:  Albuterol (Proventil)  Docusate (Colace)  Docusate (Dulcolax)  Melatonin  Polyethylene Glycol (MiraLAX)  Supplement ==================================================================== For clinical consultation, please call [phone]. ====================================================================    UDS interpretation: Compliant          Medication Assessment Form: Reviewed. Patient indicates being compliant with therapy Treatment compliance: Compliant Risk Assessment Profile: Aberrant behavior: See prior evaluations. None observed or detected today Comorbid factors increasing risk of overdose: See prior notes. No additional risks detected today Risk of substance use disorder (SUD): Low Opioid Risk Tool (ORT) Total Score:  0  Interpretation Table:  Score <3 = Low Risk for SUD  Score between 4-7 = Moderate Risk for SUD  Score >8 = High Risk for Opioid Abuse   Risk Mitigation Strategies:  Patient Counseling: Covered Patient-Prescriber Agreement (PPA): Present and active  Notification to other healthcare providers: Done  Pharmacologic Plan: No change in therapy, at this time  Laboratory Chemistry  Inflammation Markers Lab Results  Component Value Date   CRP 1.2 (H) 03/18/2016   ESRSEDRATE 26 03/18/2016   (CRP: Acute Phase) (ESR: Chronic Phase) Renal Function Markers Lab Results  Component Value Date   BUN 14 03/18/2016   CREATININE 0.71 03/18/2016   GFRAA >60 03/18/2016   GFRNONAA >60 03/18/2016   Hepatic Function Markers Lab Results  Component Value Date   AST 18 03/18/2016   ALT 12 (L) 03/18/2016   ALBUMIN 4.1 03/18/2016   ALKPHOS 97 03/18/2016   Electrolytes Lab  Results  Component Value Date   NA 139 03/18/2016   K 3.8 03/18/2016   CL 109 03/18/2016   CALCIUM 9.0 03/18/2016   MG 2.2 03/18/2016   Neuropathy Markers Lab Results  Component Value Date   VITAMINB12 212 03/18/2016   Bone Pathology Markers Lab Results  Component Value Date   ALKPHOS 97 03/18/2016   VD25OH 25.3 (L) 03/18/2016   VD125OH2TOT 76.2 03/18/2016   25OHVITD1 35 03/18/2016   25OHVITD2 1.6 03/18/2016   25OHVITD3 33 03/18/2016   CALCIUM 9.0 03/18/2016   Coagulation Parameters Lab Results  Component Value Date   PLT 214 03/08/2014   Cardiovascular Markers Lab Results  Component Value Date   HGB 13.0 03/08/2014   HCT 38.9 03/08/2014   Note: Lab results reviewed.  Recent Diagnostic Imaging Review  No results found. Note: Imaging results reviewed.          Meds  The patient has a current medication list which includes the following prescription(s): albuterol, polyethylene glycol, benefiber, bisacodyl, docusate sodium, melatonin, oxycodone, oxycodone, and oxycodone.  Current Outpatient Prescriptions on File Prior to Visit  Medication Sig  . albuterol (PROVENTIL HFA;VENTOLIN HFA) 108 (90 Base) MCG/ACT inhaler Inhale 2 puffs into the lungs every 6 (six) hours as needed for wheezing or shortness of breath.  . polyethylene glycol (MIRALAX / GLYCOLAX) packet Take by mouth. Reported on 12/16/2015  . Wheat Dextrin (BENEFIBER) POWD Stir 2 tsp. TID into 4-8 oz of any non-carbonated beverage or soft food (hot or cold)   No current facility-administered medications on file prior to visit.    ROS  Constitutional: Denies any fever or chills Gastrointestinal: No reported hemesis, hematochezia, vomiting, or acute GI distress Musculoskeletal: Denies any acute onset joint swelling, redness, loss of ROM, or weakness Neurological: No reported episodes of acute onset apraxia, aphasia, dysarthria, agnosia, amnesia, paralysis, loss of coordination, or loss of  consciousness  Allergies  Elizabeth [name] is allergic to lisinopril.  PFSH  Drug: Elizabeth [name]  reports that she does not use drugs. Alcohol:  reports that she does not drink alcohol. Tobacco:  reports that she has quit smoking. Her smoking use included Cigarettes. She smoked 0.50 packs per day. She has never used smokeless tobacco. Medical:  has a past medical history of Anxiety; Arm pain (07/25/2015); CHF (congestive heart failure) (HCC); Congestive heart failure (HCC) (07/25/2015); COPD (chronic obstructive pulmonary disease) (HCC); Depression; Lower extremity pain (07/25/2015); Reflux; RSD (reflex sympathetic dystrophy); Spinal cord stimulator status; Vitamin B12 deficiency; and Vitamin D deficiency. Family: family history includes Cancer in her father; Cirrhosis in  her mother.  Past Surgical History:  Procedure Laterality Date  . SPINAL CORD STIMULATOR IMPLANT     x 2   Constitutional Exam  General appearance: Well nourished, well developed, and well hydrated. In no apparent acute distress Vitals:   03/02/17 0917  BP: 124/70  Pulse: 85  Resp: 16  Temp: 98.1 F (36.7 C)  SpO2: 98%  Weight: 112 lb (50.8 kg)  Height: '4\' 9"'$  (1.448 m)   BMI Assessment: Estimated body mass index is 24.24 kg/m as calculated from the following:   Height as of this encounter: '4\' 9"'$  (1.448 m).   Weight as of this encounter: 112 lb (50.8 kg).  BMI interpretation table: BMI level Category Range association with higher incidence of chronic pain  <18 kg/m2 Underweight   18.5-24.9 kg/m2 Ideal body weight   25-29.9 kg/m2 Overweight Increased incidence by 20%  30-34.9 kg/m2 Obese (Class I) Increased incidence by 68%  35-39.9 kg/m2 Severe obesity (Class II) Increased incidence by 136%  >40 kg/m2 Extreme obesity (Class III) Increased incidence by 254%   BMI Readings from Last 4 Encounters:  03/02/17 24.24 kg/m  12/03/16 24.24 kg/m  09/15/16 24.24 kg/m  06/01/16 27.05 kg/m   Wt Readings from Last 4  Encounters:  03/02/17 112 lb (50.8 kg)  12/03/16 112 lb (50.8 kg)  09/15/16 112 lb (50.8 kg)  06/01/16 125 lb (56.7 kg)  Psych/Mental status: Alert, oriented x 3 (person, place, & time)       Eyes: PERLA Respiratory: No evidence of acute respiratory distress  Cervical Spine Exam  Inspection: No masses, redness, or swelling Alignment: Symmetrical Functional ROM: Unrestricted ROM      Stability: No instability detected Muscle strength & Tone: Functionally intact Sensory: Unimpaired Palpation: No palpable anomalies              Upper Extremity (UE) Exam    Side: Right upper extremity  Side: Left upper extremity  Inspection: No masses, redness, swelling,   Inspection: No masses, redness, swelling, or asymmetry. No contractures  Functional ROM: Restricted ROM          Functional ROM: Unrestricted ROM          Muscle strength & Tone: Unable to use (05)  Muscle strength & Tone: Normal strength (5/5)  Sensory: Unimpaired  Sensory: Unimpaired  Palpation: No palpable anomalies              Palpation: No palpable anomalies              Specialized Test(s): Deferred         Specialized Test(s): Deferred                      Gait & Posture Assessment  Ambulation: Unassisted Gait: Relatively normal for age and body habitus Posture: WNL   Assessment  Primary Diagnosis & Pertinent Problem List: The primary encounter diagnosis was Complex regional pain syndrome type 1 of right upper extremity. Diagnoses of Chronic pain syndrome, Opioid-induced constipation (OIC), Insomnia secondary to chronic pain, and Long term current use of opiate analgesic were also pertinent to this visit.  Status Diagnosis  Controlled Controlled Controlled 1. 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 Chronic pain syndrome   3. Opioid-induced constipation (OIC)   4. Insomnia secondary to chronic pain   5. Long term current use of opiate analgesic     Problems updated and reviewed during  this visit: No problems updated. Plan  of Care  Pharmacotherapy (Medications Ordered): Meds ordered this encounter  Medications  . oxyCODONE (OXY IR/ROXICODONE) 5 MG immediate release tablet    Sig: Take 1 tablet (5 mg total) by mouth every 6 (six) hours as needed for severe pain.    Dispense:  120 tablet    Refill:  0    Do not place this medication, or any other prescription from our practice, on "Automatic Refill". Patient may have prescription filled one day early if pharmacy is closed on scheduled refill date. Do not fill until: 04/13/17 To last until: 05/13/17    Order Specific Question:   Supervising Provider    Answer:   Milinda Pointer [phone]  . oxyCODONE (OXY IR/ROXICODONE) 5 MG immediate release tablet    Sig: Take 1 tablet (5 mg total) by mouth every 6 (six) hours as needed for severe pain.    Dispense:  120 tablet    Refill:  0    Do not place this medication, or any other prescription from our practice, on "Automatic Refill". Patient may have prescription filled one day early if pharmacy is closed on scheduled refill date. Do not fill until: 03/14/17 To last until: 04/13/17    Order Specific Question:   Supervising Provider    Answer:   Milinda Pointer [phone]  . oxyCODONE (OXY IR/ROXICODONE) 5 MG immediate release tablet    Sig: Take 1 tablet (5 mg total) by mouth every 6 (six) hours as needed for severe pain.    Dispense:  120 tablet    Refill:  0    Do not place this medication, or any other prescription from our practice, on "Automatic Refill". Patient may have prescription filled one day early if pharmacy is closed on scheduled refill date. Do not fill until: 05/13/17 To last until: 06/12/17    Order Specific Question:   Supervising Provider    Answer:   Milinda Pointer [phone]  . Melatonin 10 MG CAPS    Sig: Take 20 mg by mouth at bedtime as needed.    Dispense:  60 capsule    Refill:  2    Do not place this medication, or any other prescription  from our practice, on "Automatic Refill". Patient may have prescription filled one day early if pharmacy is closed on scheduled refill date.    Order Specific Question:   Supervising Provider    Answer:   Milinda Pointer [phone]  . bisacodyl (DULCOLAX) 5 MG EC tablet    Sig: Take 2 tablets (10 mg total) by mouth at bedtime as needed for moderate constipation ((Hold for loose stool)).    Dispense:  100 tablet    Refill:  PRN    Do not place this medication, or any other prescription from our practice, on "Automatic Refill". Patient may have prescription filled one day early if pharmacy is closed on scheduled refill date.    Order Specific Question:   Supervising Provider    Answer:   Milinda Pointer [phone]  . docusate sodium (COLACE) 100 MG capsule    Sig: Take 2 capsules (200 mg total) by mouth at bedtime as needed for moderate constipation. Do not use longer than 7 days.    Dispense:  60 capsule    Refill:  PRN    Do not place this medication, or any other prescription from our practice, on "Automatic Refill". Patient may have prescription filled one day early if pharmacy is closed on scheduled refill date.    Order Specific Question:  Supervising Provider    Answer:   Milinda Pointer [[id number]]   New Prescriptions   No medications on file   Medications administered today: Elizabeth [name] had no medications administered during this visit. Lab-work, procedure(s), and/or referral(s): Orders Placed This Encounter  Procedures  . ToxASSURE Select 13 (MW), Urine   Imaging and/or referral(s): None  Interventional therapies: Planned, scheduled, and/or pending:   She is waiting for her disability to be completely approved to get PCP Labs to be completed at the next visit Continue with current regimen.   Considering:   Diagnostic bilateral lumbar facet block   Palliative PRN treatment(s):   Palliative stellate ganglion block for the upper extremity complex regional pain  syndrome.  Palliative Lumbar sympathetic block for the lower extremity complex regional pain syndrome.  Palliative bilateral lumbar facet block   Provider-requested follow-up: Return in about 3 months (around 06/02/2017) for Medication Mgmt.  No future appointments. Primary Care Physician: Patient, No Pcp Per Location: [facility] Note by: Vevelyn Francois NP Date: 03/02/2017; Time: 9:51 AM  Pain Score Disclaimer: We use the NRS-11 scale. This is a self-reported, subjective measurement of pain severity with only modest accuracy. It is used primarily to identify changes within a particular patient. It must be understood that outpatient pain scales are significantly less accurate that those used for research, where they can be applied under ideal controlled circumstances with minimal exposure to variables. In reality, the score is likely to be a combination of pain intensity and pain affect, where pain affect describes the degree of emotional arousal or changes in action readiness caused by the sensory experience of pain. Factors such as social and work situation, setting, emotional state, anxiety levels, expectation, and prior pain experience may influence pain perception and show large inter-individual differences that may also be affected by time variables.  Patient instructions provided during this appointment: Patient Instructions  ____________________________________________________________________________________________  Medication Rules  Applies to: All patients receiving prescriptions (written or electronic).  Pharmacy of record: Pharmacy where electronic prescriptions will be sent. If written prescriptions are taken to a different pharmacy, please inform the nursing staff. The pharmacy listed in the electronic medical record should be the one where you would like electronic prescriptions to be sent.  Prescription refills: Only during scheduled appointments.  Applies to both, written and electronic prescriptions.  NOTE: The following applies primarily to controlled substances (Opioid Pain Medications)  Patient's responsibilities: 1. Pain Pills: Bring all pain pills to every appointment (except for procedure appointments). 2. Pill Bottles: Bring pills in original pharmacy bottle. Always bring newest bottle. Bring bottle, even if empty. 3. Medication refills: You are responsible for knowing and keeping track of what medications you need refilled. The day before your appointment, write a list of all prescriptions that need to be refilled. Bring that list to your appointment and give it to the admitting nurse. Prescriptions will be written only during appointments. If you forget a medication, it will not be "Called in", "Faxed", or "electronically sent". You will need to get another appointment to get these prescribed. 4. Prescription Accuracy: You are responsible for carefully inspecting your prescriptions before leaving our office. Have the discharge nurse carefully go over each prescription with you, before taking them home. Make sure that your name is accurately spelled, that your address is correct. Check the name and dose of your medication to make sure it is accurate. Check the number of pills, and the written instructions to make sure they are  clear and accurate. Make sure that you are given enough medication to last until your next medication refill appointment. 5. Taking Medication: Take medication as prescribed. Never take more pills than instructed. Never take medication more frequently than prescribed. Taking less pills or less frequently is permitted and encouraged, when it comes to controlled substances (written prescriptions).  6. Inform other Doctors: Always inform, all of your healthcare providers, of all the medications you take. 7. Pain Medication from other Providers: You are not allowed to accept any additional pain medication from any other  Doctor or Healthcare provider. There are two exceptions to this rule. (see below) In the event that you require additional pain medication, you are responsible for notifying us, as stated below. 8. Medication Agreement: You are responsible for carefully reading and following our Medication Agreement. This must be signed before receiving any prescriptions from our practice. Safely store a copy of your signed Agreement. Violations to the Agreement will result in no further prescriptions. (Additional copies of our Medication Agreement are available upon request.) 9. Laws, Rules, & Regulations: All patients are expected to follow all Federal and [employer], Rules, [employer]. Ignorance of the Laws does not constitute a valid excuse.  Exceptions: There are only two exceptions to the rule of not receiving pain medications from other Healthcare Providers. 1. Exception #1 (Emergencies): In the event of an emergency (i.e.: accident requiring emergency care), you are allowed to receive additional pain medication. However, you are responsible for: As soon as you are able, call our office [phone], at any time of the day or night, and leave a message stating your name, the date and nature of the emergency, and the name and dose of the medication prescribed. In the event that your call is answered by a member of our staff, make sure to document and save the date, time, and the name of the person that took your information.  2. Exception #2 (Planned Surgery): In the event that you are scheduled by another doctor or dentist to have any type of surgery or procedure, you are allowed (for a period no longer than 30 days), to receive additional pain medication, for the acute post-op pain. However, in this case, you are responsible for picking up a copy of our "Post-op Pain Management for Surgeons" handout, and giving it to your surgeon or dentist. This document is available at our office, and does not require  an appointment to obtain it. Simply go to our office during business hours (Monday-Thursday from 8:00 AM to 4:00 PM) (Friday 8:00 AM to 12:00 Noon) or if you have a scheduled appointment with [place], prior to your surgery, and ask for it by name. In addition, you will need to provide [place] with your name, name of your surgeon, type of surgery, and date of procedure or surgery.  _____________________________________________________________________________________________

## 2017-03-02 ENCOUNTER — Encounter: Payer: Self-pay | Admitting: Nurse Practitioner

## 2017-03-02 ENCOUNTER — Ambulatory Visit: Payer: Self-pay | Attending: Nurse Practitioner | Admitting: Nurse Practitioner

## 2017-03-02 VITALS — BP 124/70 | HR 85 | Temp 98.1°F | Resp 16 | Ht <= 58 in | Wt 112.0 lb

## 2017-03-02 DIAGNOSIS — T402X5A Adverse effect of other opioids, initial encounter: Secondary | ICD-10-CM

## 2017-03-02 DIAGNOSIS — F411 Generalized anxiety disorder: Secondary | ICD-10-CM | POA: Insufficient documentation

## 2017-03-02 DIAGNOSIS — E559 Vitamin D deficiency, unspecified: Secondary | ICD-10-CM | POA: Insufficient documentation

## 2017-03-02 DIAGNOSIS — G4701 Insomnia due to medical condition: Secondary | ICD-10-CM

## 2017-03-02 DIAGNOSIS — J449 Chronic obstructive pulmonary disease, unspecified: Secondary | ICD-10-CM | POA: Insufficient documentation

## 2017-03-02 DIAGNOSIS — G8929 Other chronic pain: Secondary | ICD-10-CM

## 2017-03-02 DIAGNOSIS — G90511 Complex regional pain syndrome I of right upper limb: Secondary | ICD-10-CM | POA: Insufficient documentation

## 2017-03-02 DIAGNOSIS — M79605 Pain in left leg: Secondary | ICD-10-CM | POA: Insufficient documentation

## 2017-03-02 DIAGNOSIS — I509 Heart failure, unspecified: Secondary | ICD-10-CM | POA: Insufficient documentation

## 2017-03-02 DIAGNOSIS — M79601 Pain in right arm: Secondary | ICD-10-CM | POA: Insufficient documentation

## 2017-03-02 DIAGNOSIS — K5903 Drug induced constipation: Secondary | ICD-10-CM

## 2017-03-02 DIAGNOSIS — Z79891 Long term (current) use of opiate analgesic: Secondary | ICD-10-CM

## 2017-03-02 DIAGNOSIS — G894 Chronic pain syndrome: Secondary | ICD-10-CM | POA: Insufficient documentation

## 2017-03-02 DIAGNOSIS — M79604 Pain in right leg: Secondary | ICD-10-CM | POA: Insufficient documentation

## 2017-03-02 MED ORDER — DOCUSATE SODIUM 100 MG PO CAPS: 200.0000 mg | ORAL_CAPSULE | Freq: Every evening | ORAL | 99 refills | Status: DC | PRN

## 2017-03-02 MED ORDER — OXYCODONE HCL 5 MG PO TABS: 5.0000 mg | ORAL_TABLET | Freq: Four times a day (QID) | ORAL | 0 refills | Status: DC | PRN

## 2017-03-02 MED ORDER — MELATONIN 10 MG PO CAPS: 20.0000 mg | ORAL_CAPSULE | Freq: Every evening | ORAL | 2 refills | Status: DC | PRN

## 2017-03-02 MED ORDER — BISACODYL 5 MG PO TBEC: 10.0000 mg | DELAYED_RELEASE_TABLET | Freq: Every evening | ORAL | 99 refills | Status: DC | PRN

## 2017-03-02 NOTE — Progress Notes (Signed)
Nursing Pain Medication Assessment:  Safety precautions to be maintained throughout the outpatient stay will include: orient to surroundings, keep bed in low position, maintain call bell within reach at all times, provide assistance with transfer out of bed and ambulation.  Medication Inspection Compliance: Pill count conducted under aseptic conditions, in front of the patient. Neither the pills nor the bottle was removed from the patient's sight at any time. Once count was completed pills were immediately returned to the patient in their original bottle.  Medication: See above Pill/Patch Count: 44 of 120 pills remain Pill/Patch Appearance: Markings consistent with prescribed medication Bottle Appearance: Standard pharmacy container. Clearly labeled. Filled Date: 05 / 04 / 2018 Last Medication intake:  Today

## 2017-03-02 NOTE — Patient Instructions (Signed)

## 2017-03-06 LAB — TOXASSURE SELECT 13 (MW), URINE

## 2017-06-01 NOTE — Progress Notes (Signed)
Patient's Name: Elizabeth [name]  MRN: [id number]  Referring Provider: No ref. provider found  DOB: Jun 01, 1961  PCP: Patient, No Pcp Per  DOS: 06/02/2017  Note by: Vevelyn Francois NP  Service setting: Ambulatory outpatient  Specialty: Interventional Pain Management  Location: [facility] (AMB) Pain Management Facility    Patient type: Established    Primary Reason(s) for Visit: Encounter for prescription drug management. (Level of risk: moderate)  CC: Generalized Body Aches (reflex sympathetic dystrophy)  HPI  Elizabeth [name] is a 56 y.o. year old, female patient, who comes today for a medication management evaluation. She has Chronic low back pain; Generalized anxiety disorder; Encounter for therapeutic drug level monitoring; Long term current use of opiate analgesic; Uncomplicated opioid dependence ([place]); Opiate use (30 MME/Day); Chronic pain syndrome; Complex regional pain syndrome I of upper limb (Bilateral); Complex regional pain syndrome I of lower limb (Bilateral); Opiate analgesic use agreement exists; Presence of spinal cord stimulator; Chronic obstructive pulmonary disease (COPD) ([place]); Congestive heart failure ([place]); Vitamin D deficiency; Vitamin B12 deficiency; Chronic upper extremity pain (Bilateral); Chronic lower extremity pain (Bilateral); Long term prescription opiate use; Encounter for chronic pain management; Elevated sedimentation rate; Insomnia secondary to chronic pain; Lumbar facet syndrome (Bilateral); and Opioid-induced constipation (OIC) on her problem list. Her primarily concern today is the Generalized Body Aches (reflex sympathetic dystrophy)  Pain Assessment: Location: Other (Comment) Other (Comment) (generalized pain caused by RSD) Radiating: na Onset: More than a month ago Duration: Chronic pain Quality: Aching, Dull, Sharp, Constant, Discomfort (many sensations that vary in intensity) Severity: 3 /10 (self-reported pain score)  Note: Reported level is compatible with  observation.                   Effect on ADL: patient is able to manage day  to day by taking medications Timing: Constant Modifying factors: medications  Elizabeth [name] was last scheduled for an appointment on 03/02/2017 for medication management. During today's appointment we reviewed Elizabeth [name] chronic pain status, as well as her outpatient medication regimen.  The patient  reports that she does not use drugs. Her body mass index is 25.97 kg/m.  Further details on both, my assessment(s), as well as the proposed treatment plan, please see below.  Controlled Substance Pharmacotherapy Assessment REMS (Risk Evaluation and Mitigation Strategy)  Analgesic:Oxycodone IR 5 mg 1 tablet by mouth every 6 hours (20 mg/day) MME/day:30 mg/day .  Janett Billow, RN  06/02/2017  9:35 AM  Sign at close encounter Nursing Pain Medication Assessment:  Safety precautions to be maintained throughout the outpatient stay will include: orient to surroundings, keep bed in low position, maintain call bell within reach at all times, provide assistance with transfer out of bed and ambulation.  Medication Inspection Compliance: Pill count conducted under aseptic conditions, in front of the patient. Neither the pills nor the bottle was removed from the patient's sight at any time. Once count was completed pills were immediately returned to the patient in their original bottle.  Medication: Oxycodone IR Pill/Patch Count: 32 of 120 pills remain Pill/Patch Appearance: Markings consistent with prescribed medication Bottle Appearance: Standard pharmacy container. Clearly labeled. Filled Date: 08 / 02 / 2018 Last Medication intake:  Today   Pharmacokinetics: Liberation and absorption (onset of action): WNL Distribution (time to peak effect): WNL Metabolism and excretion (duration of action): WNL         Pharmacodynamics: Desired effects: Analgesia: Elizabeth [name] reports >50% benefit. Functional ability: Patient  reports that medication allows  her to accomplish basic ADLs Clinically meaningful improvement in function (CMIF): Sustained CMIF goals met Perceived effectiveness: Described as relatively effective, allowing for increase in activities of daily living (ADL) Undesirable effects: Side-effects or Adverse reactions: None reported Monitoring:  PMP: Online review of the past 32-monthperiod conducted. Compliant with practice rules and regulations List of all UDS test(s) done:  Lab Results  Component Value Date   TOXASSSELUR FINAL 09/15/2016   TOXASSSELUR FINAL 03/18/2016   TOXASSSELUR FINAL 12/16/2015   TOXASSSELUR FINAL 09/23/2015   SUMMARY FINAL 02/23/2017   Last UDS on record: ToxAssure Select 13  Date Value Ref Range Status  09/15/2016 FINAL  Final    Comment:    ==================================================================== TOXASSURE SELECT 13 (MW) ==================================================================== Test                             Result       Flag       Units Drug Present and Declared for Prescription Verification   Oxycodone                      3367         EXPECTED   ng/mg creat   Oxymorphone                    3380         EXPECTED   ng/mg creat   Noroxycodone                   4480         EXPECTED   ng/mg creat   Noroxymorphone                 745          EXPECTED   ng/mg creat    Sources of oxycodone are scheduled prescription medications.    Oxymorphone, noroxycodone, and noroxymorphone are expected    metabolites of oxycodone. Oxymorphone is also available as a    scheduled prescription medication. ==================================================================== Test                      Result    Flag   Units      Ref Range   Creatinine              93               mg/dL      >=20 ==================================================================== Declared Medications:  The flagging and interpretation on this report are based on the   following declared medications.  Unexpected results may arise from  inaccuracies in the declared medications.  **Note: The testing scope of this panel includes these medications:  Oxycodone (Roxicodone)  **Note: The testing scope of this panel does not include following  reported medications:  Albuterol (Proventil)  Docusate (Colace)  Docusate (Dulcolax)  Melatonin  Polyethylene Glycol (MiraLAX)  Supplement ==================================================================== For clinical consultation, please call [phone] ====================================================================    Summary  Date Value Ref Range Status  02/23/2017 FINAL  Final    Comment:    ==================================================================== TOXASSURE SELECT 13 (MW) ==================================================================== Test                             Result       Flag       Units Drug Present and Declared for Prescription Verification  Oxycodone                      1372         EXPECTED   ng/mg creat   Oxymorphone                    1908         EXPECTED   ng/mg creat   Noroxycodone                   2521         EXPECTED   ng/mg creat   Noroxymorphone                 390          EXPECTED   ng/mg creat    Sources of oxycodone are scheduled prescription medications.    Oxymorphone, noroxycodone, and noroxymorphone are expected    metabolites of oxycodone. Oxymorphone is also available as a    scheduled prescription medication. ==================================================================== Test                      Result    Flag   Units      Ref Range   Creatinine              86               mg/dL      >=20 ==================================================================== Declared Medications:  The flagging and interpretation on this report are based on the  following declared medications.  Unexpected results may arise from  inaccuracies in the  declared medications.  **Note: The testing scope of this panel includes these medications:  Oxycodone  **Note: The testing scope of this panel does not include following  reported medications:  Albuterol (Proventil)  Docusate (Colace)  Docusate (Dulcolax)  Melatonin  Polyethylene Glycol  Supplement ==================================================================== For clinical consultation, please call [phone]. ====================================================================    UDS interpretation: Compliant          Medication Assessment Form: Reviewed. Patient indicates being compliant with therapy Treatment compliance: Compliant Risk Assessment Profile: Aberrant behavior: See prior evaluations. None observed or detected today Comorbid factors increasing risk of overdose: See prior notes. No additional risks detected today Risk of substance use disorder (SUD): Low     Opioid Risk Tool - 06/02/17 0933      Family History of Substance Abuse   Alcohol Negative   Illegal Drugs Negative   Rx Drugs Negative     Personal History of Substance Abuse   Alcohol Negative   Illegal Drugs Negative   Rx Drugs Negative     Psychological Disease   Psychological Disease Negative   Depression Negative     Total Score   Opioid Risk Tool Scoring 0   Opioid Risk Interpretation Low Risk     ORT Scoring interpretation table:  Score <3 = Low Risk for SUD  Score between 4-7 = Moderate Risk for SUD  Score >8 = High Risk for Opioid Abuse   Risk Mitigation Strategies:  Patient Counseling: Covered Patient-Prescriber Agreement (PPA): Present and active  Notification to other healthcare providers: Done  Pharmacologic Plan: No change in therapy, at this time  Laboratory Chemistry  Inflammation Markers (CRP: Acute Phase) (ESR: Chronic Phase) Lab Results  Component Value Date   CRP 1.2 (H) 03/18/2016   ESRSEDRATE 26 03/18/2016  Renal Function Markers Lab  Results  Component Value Date   BUN 14 03/18/2016   CREATININE 0.71 03/18/2016   GFRAA >60 03/18/2016   GFRNONAA >60 03/18/2016                 Hepatic Function Markers Lab Results  Component Value Date   AST 18 03/18/2016   ALT 12 (L) 03/18/2016   ALBUMIN 4.1 03/18/2016   ALKPHOS 97 03/18/2016                 Electrolytes Lab Results  Component Value Date   NA 139 03/18/2016   K 3.8 03/18/2016   CL 109 03/18/2016   CALCIUM 9.0 03/18/2016   MG 2.2 03/18/2016                 Neuropathy Markers Lab Results  Component Value Date   VITAMINB12 212 03/18/2016                 Bone Pathology Markers Lab Results  Component Value Date   ALKPHOS 97 03/18/2016   VD25OH 25.3 (L) 03/18/2016   VD125OH2TOT 76.2 03/18/2016   25OHVITD1 35 03/18/2016   25OHVITD2 1.6 03/18/2016   25OHVITD3 33 03/18/2016   CALCIUM 9.0 03/18/2016                 Coagulation Parameters Lab Results  Component Value Date   PLT 214 03/08/2014                 Cardiovascular Markers Lab Results  Component Value Date   HGB 13.0 03/08/2014   HCT 38.9 03/08/2014                 Note: Lab results reviewed.  Recent Diagnostic Imaging Review  No results found. Note: Imaging results reviewed.          Meds   Current Meds  Medication Sig  . albuterol (PROVENTIL HFA;VENTOLIN HFA) 108 (90 Base) MCG/ACT inhaler Inhale 2 puffs into the lungs every 6 (six) hours as needed for wheezing or shortness of breath.  . bisacodyl (DULCOLAX) 5 MG EC tablet Take 2 tablets (10 mg total) by mouth at bedtime as needed for moderate constipation ((Hold for loose stool)).  Marland Kitchen docusate sodium (COLACE) 100 MG capsule Take 2 capsules (200 mg total) by mouth at bedtime as needed for moderate constipation. Do not use longer than 7 days.  . polyethylene glycol (MIRALAX / GLYCOLAX) packet Take by mouth. Reported on 12/16/2015    ROS  Constitutional: Denies any fever or chills Gastrointestinal: No reported hemesis,  hematochezia, vomiting, or acute GI distress Musculoskeletal: Denies any acute onset joint swelling, redness, loss of ROM, or weakness Neurological: No reported episodes of acute onset apraxia, aphasia, dysarthria, agnosia, amnesia, paralysis, loss of coordination, or loss of consciousness  Allergies  Elizabeth [name] is allergic to lisinopril.  PFSH  Drug: Elizabeth [name]  reports that she does not use drugs. Alcohol:  reports that she does not drink alcohol. Tobacco:  reports that she has quit smoking. Her smoking use included Cigarettes. She smoked 0.50 packs per day. She has never used smokeless tobacco. Medical:  has a past medical history of Anxiety; Arm pain (07/25/2015); CHF (congestive heart failure) (HCC); Congestive heart failure (HCC) (07/25/2015); COPD (chronic obstructive pulmonary disease) (HCC); Depression; Lower extremity pain (07/25/2015); Reflux; RSD (reflex sympathetic dystrophy); Spinal cord stimulator status; Vitamin B12 deficiency; and Vitamin D deficiency. Surgical: Elizabeth [name]  has a past surgical history that includes Spinal  cord stimulator implant. Family: family history includes Cancer in her father; Cirrhosis in her mother.  Constitutional Exam  General appearance: Well nourished, well developed, and well hydrated. In no apparent acute distress Vitals:   06/02/17 0927  BP: 132/84  Pulse: 83  Resp: 16  Temp: 98.4 F (36.9 C)  TempSrc: Oral  SpO2: 96%  Weight: 120 lb (54.4 kg)  Height: '4\' 9"'$  (1.448 m)   BMI Assessment: Estimated body mass index is 25.97 kg/m as calculated from the following:   Height as of this encounter: '4\' 9"'$  (1.448 m).   Weight as of this encounter: 120 lb (54.4 kg).  Psych/Mental status: Alert, oriented x 3 (person, place, & time)       Eyes: PERLA Respiratory: No evidence of acute respiratory distress  Cervical Spine Area Exam  Skin & Axial Inspection: No masses, redness, edema, swelling, or associated skin lesions Alignment:  Symmetrical Functional ROM: Unrestricted ROM      Stability: No instability detected Muscle Tone/Strength: Functionally intact. No obvious neuro-muscular anomalies detected. Sensory (Neurological): Unimpaired Palpation: No palpable anomalies              Upper Extremity (UE) Exam    Side: Right upper extremity  Side: Left upper extremity  Skin & Extremity Inspection: Skin color, temperature, and hair growth are WNL. No peripheral edema or cyanosis. No masses, redness, swelling, asymmetry, or associated skin lesions. No contractures.  Skin & Extremity Inspection: Skin color, temperature, and hair growth are WNL. No peripheral edema or cyanosis. No masses, redness, swelling, asymmetry, or associated skin lesions. No contractures.  Functional ROM: Unrestricted ROM          Functional ROM: Unrestricted ROM          Muscle Tone/Strength: Functionally intact. No obvious neuro-muscular anomalies detected.  Muscle Tone/Strength: Functionally intact. No obvious neuro-muscular anomalies detected.  Sensory (Neurological): Unimpaired  Sensory (Neurological): Unimpaired  Palpation: No palpable anomalies              Palpation: No palpable anomalies              Specialized Test(s): Deferred         Specialized Test(s): Deferred          Thoracic Spine Area Exam  Skin & Axial Inspection: No masses, redness, or swelling Alignment: Symmetrical Functional ROM: Unrestricted ROM Stability: No instability detected Muscle Tone/Strength: Functionally intact. No obvious neuro-muscular anomalies detected. Sensory (Neurological): Unimpaired Muscle strength & Tone: No palpable anomalies  Lumbar Spine Area Exam  Skin & Axial Inspection: No masses, redness, or swelling Alignment: Symmetrical Functional ROM: Unrestricted ROM      Stability: No instability detected Muscle Tone/Strength: Functionally intact. No obvious neuro-muscular anomalies detected. Sensory (Neurological): Unimpaired Palpation: No palpable  anomalies       Provocative Tests: Lumbar Hyperextension and rotation test: evaluation deferred today       Lumbar Lateral bending test: evaluation deferred today       Patrick's Maneuver: evaluation deferred today                    Gait & Posture Assessment  Ambulation: Unassisted Gait: Relatively normal for age and body habitus Posture: WNL   Lower Extremity Exam    Side: Right lower extremity  Side: Left lower extremity  Skin & Extremity Inspection: Skin color, temperature, and hair growth are WNL. No peripheral edema or cyanosis. No masses, redness, swelling, asymmetry, or associated skin lesions. No contractures.  Skin &  Extremity Inspection: Skin color, temperature, and hair growth are WNL. No peripheral edema or cyanosis. No masses, redness, swelling, asymmetry, or associated skin lesions. No contractures.  Functional ROM: Unrestricted ROM          Functional ROM: Unrestricted ROM          Muscle Tone/Strength: Functionally intact. No obvious neuro-muscular anomalies detected.  Muscle Tone/Strength: Functionally intact. No obvious neuro-muscular anomalies detected.  Sensory (Neurological): Unimpaired  Sensory (Neurological): Unimpaired  Palpation: No palpable anomalies  Palpation: No palpable anomalies   Assessment  Primary Diagnosis & Pertinent Problem List: The primary encounter diagnosis was Lumbar facet syndrome (Bilateral). Diagnoses of Chronic pain of both upper extremities, Complex regional pain syndrome type 1 of right upper extremity, Chronic pain syndrome, and Insomnia secondary to chronic pain were also pertinent to this visit.  Status Diagnosis  Controlled Controlled Controlled 1. Lumbar facet syndrome (Bilateral)   2. Chronic pain of both upper extremities   3. Complex regional pain syndrome type 1 of right upper extremity   4. Chronic pain syndrome   5. Insomnia secondary to chronic pain     Problems updated and reviewed during this visit: No problems  updated. Plan of Care  Pharmacotherapy (Medications Ordered): Meds ordered this encounter  Medications  . oxyCODONE (OXY IR/ROXICODONE) 5 MG immediate release tablet    Sig: Take 1 tablet (5 mg total) by mouth every 6 (six) hours as needed for severe pain.    Dispense:  120 tablet    Refill:  0    Do not place this medication, or any other prescription from our practice, on "Automatic Refill". Patient may have prescription filled one day early if pharmacy is closed on scheduled refill date. Do not fill until: 08/11/2017 To last until: 09/10/2017    Order Specific Question:   Supervising Provider    Answer:   Milinda Pointer [phone]  . oxyCODONE (OXY IR/ROXICODONE) 5 MG immediate release tablet    Sig: Take 1 tablet (5 mg total) by mouth every 6 (six) hours as needed for severe pain.    Dispense:  120 tablet    Refill:  0    Do not place this medication, or any other prescription from our practice, on "Automatic Refill". Patient may have prescription filled one day early if pharmacy is closed on scheduled refill date. Do not fill until: 06/12/2017 To last until: 07/12/2017    Order Specific Question:   Supervising Provider    Answer:   Milinda Pointer [phone]  . oxyCODONE (OXY IR/ROXICODONE) 5 MG immediate release tablet    Sig: Take 1 tablet (5 mg total) by mouth every 6 (six) hours as needed for severe pain.    Dispense:  120 tablet    Refill:  0    Do not place this medication, or any other prescription from our practice, on "Automatic Refill". Patient may have prescription filled one day early if pharmacy is closed on scheduled refill date. Do not fill until:07/12/2017  To last until: 08/11/2017    Order Specific Question:   Supervising Provider    Answer:   Milinda Pointer [phone]  . Melatonin 10 MG CAPS    Sig: Take 20 mg by mouth at bedtime as needed.    Dispense:  60 capsule    Refill:  2    Do not place this medication, or any other prescription from our practice,  on "Automatic Refill". Patient may have prescription filled one day early if pharmacy is closed on  scheduled refill date.    Order Specific Question:   Supervising Provider    Answer:   Milinda Pointer [[id number]]   New Prescriptions   No medications on file   Medications administered today: Elizabeth [name] had no medications administered during this visit. Lab-work, procedure(s), and/or referral(s): No orders of the defined types were placed in this encounter.  Imaging and/or referral(s): None  Interventional therapies: Planned, scheduled, and/or pending:   Labs to be completed in December with medicaid Continue with current regimen.   Considering:   Diagnostic bilateral lumbar facet block   Palliative PRN treatment(s):   Palliative stellate ganglion block for the upper extremity complex regional pain syndrome.  Palliative Lumbar sympathetic block for the lower extremity complex regional pain syndrome.  Palliative bilateral lumbar facet block   Provider-requested follow-up: Return in about 3 months (around 09/02/2017) for MedMgmt.  Future Appointments Date Time Provider [place]  08/30/2017 9:00 AM Vevelyn Francois, NP [facility] None   Primary Care Physician: Patient, No Pcp Per Location: [facility] Outpatient Pain Management Facility Note by: Vevelyn Francois NP Date: 06/02/2017; Time: 3:17 PM  Pain Score Disclaimer: We use the NRS-11 scale. This is a self-reported, subjective measurement of pain severity with only modest accuracy. It is used primarily to identify changes within a particular patient. It must be understood that outpatient pain scales are significantly less accurate that those used for research, where they can be applied under ideal controlled circumstances with minimal exposure to variables. In reality, the score is likely to be a combination of pain intensity and pain affect, where pain affect describes the degree of emotional arousal or changes in action  readiness caused by the sensory experience of pain. Factors such as social and work situation, setting, emotional state, anxiety levels, expectation, and prior pain experience may influence pain perception and show large inter-individual differences that may also be affected by time variables.  Patient instructions provided during this appointment: Patient Instructions   ____________________________________________________________________________________________  Medication Rules  Applies to: All patients receiving prescriptions (written or electronic).  Pharmacy of record: Pharmacy where electronic prescriptions will be sent. If written prescriptions are taken to a different pharmacy, please inform the nursing staff. The pharmacy listed in the electronic medical record should be the one where you would like electronic prescriptions to be sent.  Prescription refills: Only during scheduled appointments. Applies to both, written and electronic prescriptions.  NOTE: The following applies primarily to controlled substances (Opioid* Pain Medications).   Patient's responsibilities: 1. Pain Pills: Bring all pain pills to every appointment (except for procedure appointments). 2. Pill Bottles: Bring pills in original pharmacy bottle. Always bring newest bottle. Bring bottle, even if empty. 3. Medication refills: You are responsible for knowing and keeping track of what medications you need refilled. The day before your appointment, write a list of all prescriptions that need to be refilled. Bring that list to your appointment and give it to the admitting nurse. Prescriptions will be written only during appointments. If you forget a medication, it will not be "Called in", "Faxed", or "electronically sent". You will need to get another appointment to get these prescribed. 4. Prescription Accuracy: You are responsible for carefully inspecting your prescriptions before leaving our office. Have the discharge  nurse carefully go over each prescription with you, before taking them home. Make sure that your name is accurately spelled, that your address is correct. Check the name and dose of your medication to make sure it is accurate. Check the  number of pills, and the written instructions to make sure they are clear and accurate. Make sure that you are given enough medication to last until your next medication refill appointment. 5. Taking Medication: Take medication as prescribed. Never take more pills than instructed. Never take medication more frequently than prescribed. Taking less pills or less frequently is permitted and encouraged, when it comes to controlled substances (written prescriptions).  6. Inform other Doctors: Always inform, all of your healthcare providers, of all the medications you take. 7. Pain Medication from other Providers: You are not allowed to accept any additional pain medication from any other Doctor or Healthcare provider. There are two exceptions to this rule. (see below) In the event that you require additional pain medication, you are responsible for notifying us, as stated below. 8. Medication Agreement: You are responsible for carefully reading and following our Medication Agreement. This must be signed before receiving any prescriptions from our practice. Safely store a copy of your signed Agreement. Violations to the Agreement will result in no further prescriptions. (Additional copies of our Medication Agreement are available upon request.) 9. Laws, Rules, & Regulations: All patients are expected to follow all Federal and [employer], Rules, [employer]. Ignorance of the Laws does not constitute a valid excuse. The use of any illegal substances is prohibited. 10. Adopted CDC guidelines & recommendations: Target dosing levels will be at or below 60 MME/day. Use of benzodiazepines** is not recommended.  Exceptions: There are only two exceptions to the rule of not  receiving pain medications from other Healthcare Providers. 1. Exception #1 (Emergencies): In the event of an emergency (i.e.: accident requiring emergency care), you are allowed to receive additional pain medication. However, you are responsible for: As soon as you are able, call our office [phone], at any time of the day or night, and leave a message stating your name, the date and nature of the emergency, and the name and dose of the medication prescribed. In the event that your call is answered by a member of our staff, make sure to document and save the date, time, and the name of the person that took your information.  2. Exception #2 (Planned Surgery): In the event that you are scheduled by another doctor or dentist to have any type of surgery or procedure, you are allowed (for a period no longer than 30 days), to receive additional pain medication, for the acute post-op pain. However, in this case, you are responsible for picking up a copy of our "Post-op Pain Management for Surgeons" handout, and giving it to your surgeon or dentist. This document is available at our office, and does not require an appointment to obtain it. Simply go to our office during business hours (Monday-Thursday from 8:00 AM to 4:00 PM) (Friday 8:00 AM to 12:00 Noon) or if you have a scheduled appointment with [place], prior to your surgery, and ask for it by name. In addition, you will need to provide [place] with your name, name of your surgeon, type of surgery, and date of procedure or surgery.  *Opioid medications include: morphine, codeine, oxycodone, oxymorphone, hydrocodone, hydromorphone, meperidine, tramadol, tapentadol, buprenorphine, fentanyl, methadone. **Benzodiazepine medications include: diazepam (Valium), alprazolam (Xanax), clonazepam (Klonopine), lorazepam (Ativan), clorazepate (Tranxene), chlordiazepoxide (Librium), estazolam (Prosom), oxazepam (Serax), temazepam (Restoril), triazolam  (Halcion)  ____________________________________________________________________________________________

## 2017-06-02 ENCOUNTER — Ambulatory Visit: Payer: PRIVATE HEALTH INSURANCE | Attending: Nurse Practitioner | Admitting: Nurse Practitioner

## 2017-06-02 ENCOUNTER — Encounter: Payer: Self-pay | Admitting: Nurse Practitioner

## 2017-06-02 VITALS — BP 132/84 | HR 83 | Temp 98.4°F | Resp 16 | Ht <= 58 in | Wt 120.0 lb

## 2017-06-02 DIAGNOSIS — M4696 Unspecified inflammatory spondylopathy, lumbar region: Secondary | ICD-10-CM

## 2017-06-02 DIAGNOSIS — I509 Heart failure, unspecified: Secondary | ICD-10-CM | POA: Insufficient documentation

## 2017-06-02 DIAGNOSIS — J449 Chronic obstructive pulmonary disease, unspecified: Secondary | ICD-10-CM | POA: Insufficient documentation

## 2017-06-02 DIAGNOSIS — G8929 Other chronic pain: Secondary | ICD-10-CM

## 2017-06-02 DIAGNOSIS — M79602 Pain in left arm: Secondary | ICD-10-CM

## 2017-06-02 DIAGNOSIS — F329 Major depressive disorder, single episode, unspecified: Secondary | ICD-10-CM | POA: Insufficient documentation

## 2017-06-02 DIAGNOSIS — M47816 Spondylosis without myelopathy or radiculopathy, lumbar region: Secondary | ICD-10-CM

## 2017-06-02 DIAGNOSIS — M79601 Pain in right arm: Secondary | ICD-10-CM | POA: Insufficient documentation

## 2017-06-02 DIAGNOSIS — G90511 Complex regional pain syndrome I of right upper limb: Secondary | ICD-10-CM | POA: Insufficient documentation

## 2017-06-02 DIAGNOSIS — Z87891 Personal history of nicotine dependence: Secondary | ICD-10-CM | POA: Insufficient documentation

## 2017-06-02 DIAGNOSIS — F411 Generalized anxiety disorder: Secondary | ICD-10-CM | POA: Insufficient documentation

## 2017-06-02 DIAGNOSIS — Z79899 Other long term (current) drug therapy: Secondary | ICD-10-CM | POA: Insufficient documentation

## 2017-06-02 DIAGNOSIS — E559 Vitamin D deficiency, unspecified: Secondary | ICD-10-CM | POA: Insufficient documentation

## 2017-06-02 DIAGNOSIS — K219 Gastro-esophageal reflux disease without esophagitis: Secondary | ICD-10-CM | POA: Insufficient documentation

## 2017-06-02 DIAGNOSIS — G894 Chronic pain syndrome: Secondary | ICD-10-CM | POA: Insufficient documentation

## 2017-06-02 DIAGNOSIS — G4701 Insomnia due to medical condition: Secondary | ICD-10-CM

## 2017-06-02 DIAGNOSIS — Z888 Allergy status to other drugs, medicaments and biological substances status: Secondary | ICD-10-CM | POA: Insufficient documentation

## 2017-06-02 DIAGNOSIS — Z8489 Family history of other specified conditions: Secondary | ICD-10-CM | POA: Insufficient documentation

## 2017-06-02 DIAGNOSIS — M79604 Pain in right leg: Secondary | ICD-10-CM | POA: Insufficient documentation

## 2017-06-02 DIAGNOSIS — Z809 Family history of malignant neoplasm, unspecified: Secondary | ICD-10-CM | POA: Insufficient documentation

## 2017-06-02 DIAGNOSIS — M79605 Pain in left leg: Secondary | ICD-10-CM | POA: Insufficient documentation

## 2017-06-02 MED ORDER — OXYCODONE HCL 5 MG PO TABS: 5.0000 mg | ORAL_TABLET | Freq: Four times a day (QID) | ORAL | 0 refills | Status: DC | PRN

## 2017-06-02 MED ORDER — MELATONIN 10 MG PO CAPS: 20.0000 mg | ORAL_CAPSULE | Freq: Every evening | ORAL | 2 refills | Status: DC | PRN

## 2017-06-02 NOTE — Patient Instructions (Signed)

## 2017-06-02 NOTE — Progress Notes (Signed)
Nursing Pain Medication Assessment:  Safety precautions to be maintained throughout the outpatient stay will include: orient to surroundings, keep bed in low position, maintain call bell within reach at all times, provide assistance with transfer out of bed and ambulation.  Medication Inspection Compliance: Pill count conducted under aseptic conditions, in front of the patient. Neither the pills nor the bottle was removed from the patient's sight at any time. Once count was completed pills were immediately returned to the patient in their original bottle.  Medication: Oxycodone IR Pill/Patch Count: 32 of 120 pills remain Pill/Patch Appearance: Markings consistent with prescribed medication Bottle Appearance: Standard pharmacy container. Clearly labeled. Filled Date: 08 / 02 / 2018 Last Medication intake:  Today

## 2017-08-04 ENCOUNTER — Emergency Department: Payer: Medicaid Other

## 2017-08-04 ENCOUNTER — Emergency Department
Admission: EM | Admit: 2017-08-04 | Discharge: 2017-08-04 | Disposition: A | Discharge status: Home / Self Care | Payer: Medicaid Other | Attending: Emergency Medicine | Admitting: Emergency Medicine

## 2017-08-04 ENCOUNTER — Encounter: Payer: Self-pay | Admitting: Emergency Medicine

## 2017-08-04 DIAGNOSIS — G8929 Other chronic pain: Secondary | ICD-10-CM | POA: Diagnosis not present

## 2017-08-04 DIAGNOSIS — R0989 Other specified symptoms and signs involving the circulatory and respiratory systems: Secondary | ICD-10-CM | POA: Insufficient documentation

## 2017-08-04 DIAGNOSIS — S99921A Unspecified injury of right foot, initial encounter: Secondary | ICD-10-CM | POA: Diagnosis present

## 2017-08-04 DIAGNOSIS — Y929 Unspecified place or not applicable: Secondary | ICD-10-CM | POA: Insufficient documentation

## 2017-08-04 DIAGNOSIS — Y999 Unspecified external cause status: Secondary | ICD-10-CM | POA: Diagnosis not present

## 2017-08-04 DIAGNOSIS — R062 Wheezing: Secondary | ICD-10-CM | POA: Insufficient documentation

## 2017-08-04 DIAGNOSIS — I509 Heart failure, unspecified: Secondary | ICD-10-CM | POA: Insufficient documentation

## 2017-08-04 DIAGNOSIS — R059 Cough, unspecified: Secondary | ICD-10-CM

## 2017-08-04 DIAGNOSIS — M79671 Pain in right foot: Secondary | ICD-10-CM | POA: Diagnosis not present

## 2017-08-04 DIAGNOSIS — Y939 Activity, unspecified: Secondary | ICD-10-CM | POA: Diagnosis not present

## 2017-08-04 DIAGNOSIS — R05 Cough: Secondary | ICD-10-CM | POA: Insufficient documentation

## 2017-08-04 DIAGNOSIS — W230XXA Caught, crushed, jammed, or pinched between moving objects, initial encounter: Secondary | ICD-10-CM | POA: Insufficient documentation

## 2017-08-04 DIAGNOSIS — J40 Bronchitis, not specified as acute or chronic: Secondary | ICD-10-CM | POA: Diagnosis not present

## 2017-08-04 DIAGNOSIS — S9031XA Contusion of right foot, initial encounter: Secondary | ICD-10-CM | POA: Diagnosis not present

## 2017-08-04 DIAGNOSIS — F17210 Nicotine dependence, cigarettes, uncomplicated: Secondary | ICD-10-CM | POA: Insufficient documentation

## 2017-08-04 DIAGNOSIS — J449 Chronic obstructive pulmonary disease, unspecified: Secondary | ICD-10-CM | POA: Insufficient documentation

## 2017-08-04 MED ORDER — AZITHROMYCIN 250 MG PO TABS: ORAL_TABLET | ORAL | 0 refills | Status: AC

## 2017-08-04 MED ORDER — ALBUTEROL SULFATE (2.5 MG/3ML) 0.083% IN NEBU
2.5000 mg | INHALATION_SOLUTION | Freq: Once | RESPIRATORY_TRACT | Status: AC
  Administered 2017-08-04: 2.5 mg via RESPIRATORY_TRACT
  Filled 2017-08-04: qty 3

## 2017-08-04 MED ORDER — PREDNISONE 10 MG (21) PO TBPK: ORAL_TABLET | ORAL | 0 refills | Status: DC

## 2017-08-04 MED ORDER — PREDNISONE 20 MG PO TABS
60.0000 mg | ORAL_TABLET | Freq: Once | ORAL | Status: AC
  Administered 2017-08-04: 60 mg via ORAL
  Filled 2017-08-04: qty 3

## 2017-08-04 MED ORDER — GUAIFENESIN-CODEINE 100-10 MG/5ML PO SOLN: 5.0000 mL | Freq: Three times a day (TID) | ORAL | 0 refills | Status: DC | PRN

## 2017-08-04 NOTE — ED Provider Notes (Signed)
[facility] Emergency Department Provider Note   ____________________________________________   I have reviewed the triage vital signs and the nursing notes.   HISTORY  Chief Complaint Foot Pain    HPI Elizabeth [name] is a 56 y.o. female presents to the emergency department with right foot pain, swelling and bruising after dropping a cinder block on her foot earlier today.  Patient reports in addition to the above symptoms, difficulty bearing weight through the right foot and notes numbness as swelling has increased. Patient denies sustaining any other injury to the ankle or lower leg. After patient arrived to the emergency department, she also reported a 10-day history of cough, congestion, wheezing, fever and chills.  Patient reports taking several over-the-counter medications without improvement of symptoms.  Patient is a longtime smoker and has had several episodes of bronchitis.  Patient denies any history of pneumonia. Patient denies headache, vision changes, chest pain, chest tightness, shortness of breath, abdominal pain, nausea and vomiting.  Past Medical History:  Diagnosis Date  . Anxiety   . Arm pain 07/25/2015  . CHF (congestive heart failure) (HCC)   . Congestive heart failure (HCC) 07/25/2015  . COPD (chronic obstructive pulmonary disease) (HCC)    never been idagnosed  . Depression   . Lower extremity pain 07/25/2015  . Reflux   . RSD (reflex sympathetic dystrophy)   . Spinal cord stimulator status   . Vitamin B12 deficiency   . Vitamin D deficiency     Patient Active Problem List   Diagnosis Date Noted  . Opioid-induced constipation (OIC) 09/15/2016  . Lumbar facet syndrome (Bilateral) 03/18/2016  . 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 Chronic upper extremity pain (Bilateral) 09/23/2015  . Chronic lower extremity pain (Bilateral) 09/23/2015  . Long term prescription opiate use 09/23/2015  . Encounter for chronic pain  management 09/23/2015  . Elevated sedimentation rate 09/23/2015  . Chronic low back pain 07/25/2015  . Generalized anxiety disorder 07/25/2015  . Encounter for therapeutic drug level monitoring 07/25/2015  . Long term current use of opiate analgesic 07/25/2015  . Uncomplicated opioid dependence (HCC) 07/25/2015  . Opiate use (30 MME/Day) 07/25/2015  . Chronic pain syndrome 07/25/2015  . Complex regional pain syndrome I of upper limb (Bilateral) 07/25/2015  . Complex regional pain syndrome I of lower limb (Bilateral) 07/25/2015  . Opiate analgesic use agreement exists 07/25/2015  . Presence of spinal cord stimulator 07/25/2015  . Chronic obstructive pulmonary disease (COPD) (HCC) 07/25/2015  . Congestive heart failure (HCC) 07/25/2015  . Vitamin D deficiency 07/25/2015  . Vitamin B12 deficiency 07/25/2015    Past Surgical History:  Procedure Laterality Date  . SPINAL CORD STIMULATOR IMPLANT     x 2    Prior to Admission medications   Medication Sig Start Date End Date Taking? Authorizing Provider  albuterol (PROVENTIL HFA;VENTOLIN HFA) 108 (90 Base) MCG/ACT inhaler Inhale 2 puffs into the lungs every 6 (six) hours as needed for wheezing or shortness of breath. 11/30/15   Tommi Rumps, PA-C  azithromycin (ZITHROMAX Z-PAK) 250 MG tablet Take 2 tablets (500 mg) on  Day 1,  followed by 1 tablet (250 mg) once daily on Days 2 through 5. 08/04/17 08/09/17  Little, Traci M, PA-C  bisacodyl (DULCOLAX) 5 MG EC tablet Take 2 tablets (10 mg total) by mouth at bedtime as needed for moderate constipation ((Hold for loose stool)). 03/14/17   Barbette Merino, NP  docusate sodium (COLACE) 100 MG capsule Take  2 capsules (200 mg total) by mouth at bedtime as needed for moderate constipation. Do not use longer than 7 days. 03/14/17   Barbette MerinoKing, Crystal M, NP  guaiFENesin-codeine 100-10 MG/5ML syrup Take 5 mLs by mouth 3 (three) times daily as needed for cough. 08/04/17   Little, Traci M, PA-C  Melatonin 10 MG  CAPS Take 20 mg by mouth at bedtime as needed. 06/12/17 09/10/17  Barbette MerinoKing, Crystal M, NP  oxyCODONE (OXY IR/ROXICODONE) 5 MG immediate release tablet Take 1 tablet (5 mg total) by mouth every 6 (six) hours as needed for severe pain. 08/11/17 09/10/17  Barbette MerinoKing, Crystal M, NP  oxyCODONE (OXY IR/ROXICODONE) 5 MG immediate release tablet Take 1 tablet (5 mg total) by mouth every 6 (six) hours as needed for severe pain. 06/12/17 07/12/17  Barbette MerinoKing, Crystal M, NP  oxyCODONE (OXY IR/ROXICODONE) 5 MG immediate release tablet Take 1 tablet (5 mg total) by mouth every 6 (six) hours as needed for severe pain. 07/12/17 08/11/17  Barbette MerinoKing, Crystal M, NP  polyethylene glycol (MIRALAX / GLYCOLAX) packet Take by mouth. Reported on 12/16/2015    [provider]  predniSONE (STERAPRED UNI-PAK 21 TAB) 10 MG (21) TBPK tablet Take 6 tablets on day 1. Take 5 tablets on day 2. Take 4 tablets on day 3. Take 3 tablets on day 4. Take 2 tablets on day 5. Take 1 tablets on day 6. 08/04/17   Little, Traci M, PA-C    Allergies Lisinopril  Family History  Problem Relation Age of Onset  . Cirrhosis Mother   . Cancer Father     Social History Social History  Substance Use Topics  . Smoking status: Current Every Day Smoker    Packs/day: 0.50    Types: Cigarettes  . Smokeless tobacco: Never Used     Comment: Quit 2 years ago  . Alcohol use No    Review of Systems Constitutional: Positive for fever/chills Eyes: No visual changes. ENT:  Negative for sore throat and for difficulty swallowing Cardiovascular: Denies chest pain. Respiratory: Positive for cough. Denies shortness of breath. Musculoskeletal: Positive for right foot pain and swelling.  Skin: Negative for rash. Neurological: Negative for headaches.  ____________________________________________   PHYSICAL EXAM:  VITAL SIGNS: ED Triage Vitals  Enc Vitals Group     BP 08/04/17 1409 (!) 151/83     Pulse Rate 08/04/17 1409 97     Resp 08/04/17 1409 18     Temp  08/04/17 1409 98.1 F (36.7 C)     Temp Source 08/04/17 1409 Oral     SpO2 08/04/17 1409 97 %     Weight 08/04/17 1410 122 lb (55.3 kg)     Height 08/04/17 1410 4\' 9"  (1.448 m)     Head Circumference --      Peak Flow --      Pain Score 08/04/17 1428 6     Pain Loc --      Pain Edu? --      Excl. in GC? --     Constitutional: Alert and oriented. Well appearing and in no acute distress.  Eyes: Conjunctivae are normal. PERRL. EOMI  Head: Normocephalic and atraumatic. ENT:      Ears: Canals clear. TMs intact bilaterally.      Nose: No congestion/rhinnorhea.      Mouth/Throat: Mucous membranes are moist. Oropharynx nonerythematous. Neck:Supple. No stridor.  Cardiovascular: Normal rate, regular rhythm. Normal S1 and S2.  Good peripheral circulation. Respiratory: Normal respiratory effort without tachypnea or retractions.  Lungs CTAB. Wheezes bilateral lower lobe bases. Good air entry to the bases with no decreased or absent breath sounds. Non-productive cough.  Gastrointestinal: Bowel sounds 4 quadrants. Soft and nontender to palpation.  Musculoskeletal: Right foot and ankle ROM intact all planes, limited subjectively by pain. Visible swelling and ecchymosis over the dorsal aspect of the distal right foot. Decreased sensation noted of toes 3-5 likely associated with current swelling.  Neurologic: Normal speech and language.  Skin:  Skin is warm, dry and intact. No rash noted. Psychiatric: Mood and affect are normal. Speech and behavior are normal. Patient exhibits appropriate insight and judgement.  ____________________________________________   LABS (all labs ordered are listed, but only abnormal results are displayed)  Labs Reviewed - No data to display ____________________________________________  EKG none ____________________________________________  RADIOLOGY DG right foot FINDINGS: No acute displaced fracture or malalignment. No radiopaque foreign body in the soft  tissues  IMPRESSION: No acute osseous abnormality  DG chest 2 view FINDINGS: Normal mediastinum and cardiac silhouette. Normal pulmonary vasculature. No evidence of effusion, infiltrate, or pneumothorax. No acute bony abnormality.  Spinal stimulation leads unchanged  IMPRESSION: No acute cardiopulmonary process. ____________________________________________   PROCEDURES  Procedure(s) performed: no    Critical Care performed: no ____________________________________________   INITIAL IMPRESSION / ASSESSMENT AND PLAN / ED COURSE  Pertinent labs & imaging results that were available during my care of the patient were reviewed by me and considered in my medical decision making (see chart for details).  Patient presents to emergency department with right foot pain and swelling after traumatic injury and cough, congestion, fever and chills for 10 days. History, physical exam findings and imagaing are consistent with right foot contusion and bronchitis. Imaging results are reassuring of no acute fracture, dislocation or subluxation of the right foot and acute effusion, infiltration or pnemothorax of the lung fields. Patient advised to utilize crutches until right foot symptoms improve.  Patient will be prescribed azithromycin, prednisone taper and guaifenesin with codeine. Patient advised to follow up with PCP  or return to the emergency department if symptoms return or worsen. Patient informed of clinical course, understand medical decision-making process, and agree with plan.   ____________________________________________   FINAL CLINICAL IMPRESSION(S) / ED DIAGNOSES  Final diagnoses:  Contusion of right foot, initial encounter  Right foot pain  Bronchitis  Wheezing  Cough  Chest congestion       NEW MEDICATIONS STARTED DURING THIS VISIT:  Discharge Medication List as of 08/04/2017  5:17 PM    START taking these medications   Details  azithromycin (ZITHROMAX Z-PAK)  250 MG tablet Take 2 tablets (500 mg) on  Day 1,  followed by 1 tablet (250 mg) once daily on Days 2 through 5., Print    guaiFENesin-codeine 100-10 MG/5ML syrup Take 5 mLs by mouth 3 (three) times daily as needed for cough., Starting Wed 08/04/2017, Print    predniSONE (STERAPRED UNI-PAK 21 TAB) 10 MG (21) TBPK tablet Take 6 tablets on day 1. Take 5 tablets on day 2. Take 4 tablets on day 3. Take 3 tablets on day 4. Take 2 tablets on day 5. Take 1 tablets on day 6., Print         Note:  This document was prepared using Dragon voice recognition software and may include unintentional dictation errors.    Clois Comber, PA-C 08/04/17 2320    Sharman Cheek, MD 08/05/17 [phone]

## 2017-08-04 NOTE — Discharge Instructions (Signed)
Take medication as prescribed.   Return to emergency department if symptoms worsen and follow-up with PCP as needed.    Elevate, ice and use compression on the right foot to reduce symptoms.   Use crutches as needed, weight bearing as tolerated until symptoms improve.

## 2017-08-04 NOTE — ED Triage Notes (Signed)
Pt to ED c/o right foot pain, states dropped cinder block on her foot.  Foot swollen, states numbness to toes with <3sec cap refill.

## 2017-08-30 ENCOUNTER — Ambulatory Visit: Payer: Medicaid Other | Attending: Nurse Practitioner | Admitting: Nurse Practitioner

## 2017-08-30 ENCOUNTER — Encounter: Payer: Self-pay | Admitting: Nurse Practitioner

## 2017-08-30 VITALS — BP 152/81 | HR 85 | Temp 98.2°F | Resp 16 | Ht <= 58 in | Wt 115.0 lb

## 2017-08-30 DIAGNOSIS — F329 Major depressive disorder, single episode, unspecified: Secondary | ICD-10-CM | POA: Insufficient documentation

## 2017-08-30 DIAGNOSIS — M79641 Pain in right hand: Secondary | ICD-10-CM | POA: Diagnosis not present

## 2017-08-30 DIAGNOSIS — G90511 Complex regional pain syndrome I of right upper limb: Secondary | ICD-10-CM

## 2017-08-30 DIAGNOSIS — G4701 Insomnia due to medical condition: Secondary | ICD-10-CM

## 2017-08-30 DIAGNOSIS — K219 Gastro-esophageal reflux disease without esophagitis: Secondary | ICD-10-CM | POA: Diagnosis not present

## 2017-08-30 DIAGNOSIS — Z789 Other specified health status: Secondary | ICD-10-CM

## 2017-08-30 DIAGNOSIS — G894 Chronic pain syndrome: Secondary | ICD-10-CM

## 2017-08-30 DIAGNOSIS — G90523 Complex regional pain syndrome I of lower limb, bilateral: Secondary | ICD-10-CM

## 2017-08-30 DIAGNOSIS — M79602 Pain in left arm: Secondary | ICD-10-CM

## 2017-08-30 DIAGNOSIS — M899 Disorder of bone, unspecified: Secondary | ICD-10-CM | POA: Diagnosis not present

## 2017-08-30 DIAGNOSIS — Z79891 Long term (current) use of opiate analgesic: Secondary | ICD-10-CM | POA: Diagnosis not present

## 2017-08-30 DIAGNOSIS — M79601 Pain in right arm: Secondary | ICD-10-CM

## 2017-08-30 DIAGNOSIS — Z79899 Other long term (current) drug therapy: Secondary | ICD-10-CM | POA: Insufficient documentation

## 2017-08-30 DIAGNOSIS — E559 Vitamin D deficiency, unspecified: Secondary | ICD-10-CM | POA: Insufficient documentation

## 2017-08-30 DIAGNOSIS — Z5181 Encounter for therapeutic drug level monitoring: Secondary | ICD-10-CM | POA: Diagnosis present

## 2017-08-30 DIAGNOSIS — F17210 Nicotine dependence, cigarettes, uncomplicated: Secondary | ICD-10-CM | POA: Insufficient documentation

## 2017-08-30 DIAGNOSIS — M545 Low back pain, unspecified: Secondary | ICD-10-CM

## 2017-08-30 DIAGNOSIS — I509 Heart failure, unspecified: Secondary | ICD-10-CM | POA: Diagnosis not present

## 2017-08-30 DIAGNOSIS — J449 Chronic obstructive pulmonary disease, unspecified: Secondary | ICD-10-CM | POA: Insufficient documentation

## 2017-08-30 DIAGNOSIS — G8929 Other chronic pain: Secondary | ICD-10-CM

## 2017-08-30 DIAGNOSIS — M79605 Pain in left leg: Secondary | ICD-10-CM

## 2017-08-30 DIAGNOSIS — Z9689 Presence of other specified functional implants: Secondary | ICD-10-CM | POA: Insufficient documentation

## 2017-08-30 DIAGNOSIS — M79604 Pain in right leg: Secondary | ICD-10-CM

## 2017-08-30 MED ORDER — OXYCODONE HCL 5 MG PO TABS: 5.0000 mg | ORAL_TABLET | Freq: Four times a day (QID) | ORAL | 0 refills | Status: DC | PRN

## 2017-08-30 NOTE — Progress Notes (Signed)
Patient's Name: Elizabeth [name]  MRN: [id number]  Referring Provider: No ref. provider found  DOB: 14-Jan-1961  PCP: Patient, No Pcp Per  DOS: 08/30/2017  Note by: Vevelyn Francois NP  Service setting: Ambulatory outpatient  Specialty: Interventional Pain Management  Location: [facility] (AMB) Pain Management Facility    Patient type: Established    Primary Reason(s) for Visit: Encounter for prescription drug management. (Level of risk: moderate)  CC: Hand Pain (right)  HPI  Elizabeth [name] is a 56 y.o. year old, female patient, who comes today for a medication management evaluation. She has Chronic low back pain; Generalized anxiety disorder; Encounter for therapeutic drug level monitoring; Long term current use of opiate analgesic; Uncomplicated opioid dependence ([place]); Opiate use (30 MME/Day); Chronic pain syndrome; Complex regional pain syndrome I of upper limb (Bilateral); Complex regional pain syndrome I of lower limb (Bilateral); Opiate analgesic use agreement exists; Presence of spinal cord stimulator; Chronic obstructive pulmonary disease (COPD) ([place]); Congestive heart failure ([place]); Vitamin D deficiency; Vitamin B12 deficiency; Chronic upper extremity pain (Bilateral); Chronic lower extremity pain (Bilateral); Long term prescription opiate use; Encounter for chronic pain management; Elevated sedimentation rate; Insomnia secondary to chronic pain; Lumbar facet syndrome (Bilateral); Opioid-induced constipation (OIC); and Disorder of bone, unspecified on their problem list. Her primarily concern today is the Hand Pain (right)  Pain Assessment: Location: Right Hand Radiating: na Onset: More than a month ago Duration: Chronic pain Quality: Discomfort, Constant, Aching Severity: 3 /10 (self-reported pain score)  Note: Reported level is compatible with observation.                          Effect on ADL: weakness in that hand Timing: Constant Modifying factors: medications  Elizabeth [name] was last  scheduled for an appointment on 06/02/2017 for medication management. During today's appointment we reviewed Elizabeth [name]'s chronic pain status, as well as her outpatient medication regimen. She admits that her pain is stable. She denies any concerns today. She denies any side effects of her medication. She continues to use the Melatonin OTC. She states that this is only way that she can sleep.   The patient  reports that she does not use drugs. Her body mass index is 24.89 kg/m.  Further details on both, my assessment(s), as well as the proposed treatment plan, please see below.  Controlled Substance Pharmacotherapy Assessment REMS (Risk Evaluation and Mitigation Strategy)  Analgesic:Oxycodone IR 5 mg 1 tablet by mouth every 6 hours (20 mg/day) MME/day:30 mg/day .     Janett Billow, RN  08/30/2017  9:30 AM  Sign at close encounter Nursing Pain Medication Assessment:  Safety precautions to be maintained throughout the outpatient stay will include: orient to surroundings, keep bed in low position, maintain call bell within reach at all times, provide assistance with transfer out of bed and ambulation.  Medication Inspection Compliance: Pill count conducted under aseptic conditions, in front of the patient. Neither the pills nor the bottle was removed from the patient's sight at any time. Once count was completed pills were immediately returned to the patient in their original bottle.  Medication: Oxycodone IR Pill/Patch Count: 45 of 120 pills remain Pill/Patch Appearance: Markings consistent with prescribed medication Bottle Appearance: Standard pharmacy container. Clearly labeled. Filled Date: 43 / 31 / 2018 Last Medication intake:  Today   Pharmacokinetics: Liberation and absorption (onset of action): WNL Distribution (time to peak effect): WNL Metabolism and excretion (duration of action): WNL  Pharmacodynamics: Desired effects: Analgesia: Elizabeth [name] reports >50%  benefit. Functional ability: Patient reports that medication allows her to accomplish basic ADLs Clinically meaningful improvement in function (CMIF): Sustained CMIF goals met Perceived effectiveness: Described as relatively effective, allowing for increase in activities of daily living (ADL) Undesirable effects: Side-effects or Adverse reactions: None reported Monitoring: [place] PMP: Online review of the past 79-monthperiod conducted. Compliant with practice rules and regulations Last UDS on record: Summary  Date Value Ref Range Status  02/23/2017 FINAL  Final    Comment:    ==================================================================== TOXASSURE SELECT 13 (MW) ==================================================================== Test                             Result       Flag       Units Drug Present and Declared for Prescription Verification   Oxycodone                      1372         EXPECTED   ng/mg creat   Oxymorphone                    1908         EXPECTED   ng/mg creat   Noroxycodone                   2521         EXPECTED   ng/mg creat   Noroxymorphone                 390          EXPECTED   ng/mg creat    Sources of oxycodone are scheduled prescription medications.    Oxymorphone, noroxycodone, and noroxymorphone are expected    metabolites of oxycodone. Oxymorphone is also available as a    scheduled prescription medication. ==================================================================== Test                      Result    Flag   Units      Ref Range   Creatinine              86               mg/dL      >=20 ==================================================================== Declared Medications:  The flagging and interpretation on this report are based on the  following declared medications.  Unexpected results may arise from  inaccuracies in the declared medications.  **Note: The testing scope of this panel includes these medications:  Oxycodone  **Note:  The testing scope of this panel does not include following  reported medications:  Albuterol (Proventil)  Docusate (Colace)  Docusate (Dulcolax)  Melatonin  Polyethylene Glycol  Supplement ==================================================================== For clinical consultation, please call ([phone] ====================================================================    UDS interpretation: Compliant          Medication Assessment Form: Reviewed. Patient indicates being compliant with therapy Treatment compliance: Compliant Risk Assessment Profile: Aberrant behavior: See prior evaluations. None observed or detected today Comorbid factors increasing risk of overdose: See prior notes. No additional risks detected today Risk of substance use disorder (SUD): Low Opioid Risk Tool - 08/30/17 0927      Family History of Substance Abuse   Alcohol  Negative    Illegal Drugs  Negative    Rx Drugs  Negative      Personal History of Substance Abuse  Alcohol  Negative    Illegal Drugs  Negative    Rx Drugs  Negative      Psychological Disease   Psychological Disease  Negative    Depression  Negative      Total Score   Opioid Risk Tool Scoring  0    Opioid Risk Interpretation  Low Risk      ORT Scoring interpretation table:  Score <3 = Low Risk for SUD  Score between 4-7 = Moderate Risk for SUD  Score >8 = High Risk for Opioid Abuse   Risk Mitigation Strategies:  Patient Counseling: Covered Patient-Prescriber Agreement (PPA): Present and active  Notification to other healthcare providers: Done  Pharmacologic Plan: No change in therapy, at this time  Laboratory Chemistry  Inflammation Markers (CRP: Acute Phase) (ESR: Chronic Phase) Lab Results  Component Value Date   CRP 1.2 (H) 03/18/2016   ESRSEDRATE 26 03/18/2016                 Renal Function Markers Lab Results  Component Value Date   BUN 14 03/18/2016   CREATININE 0.71 03/18/2016   GFRAA >60  03/18/2016   GFRNONAA >60 03/18/2016                 Hepatic Function Markers Lab Results  Component Value Date   AST 18 03/18/2016   ALT 12 (L) 03/18/2016   ALBUMIN 4.1 03/18/2016   ALKPHOS 97 03/18/2016                 Electrolytes Lab Results  Component Value Date   NA 139 03/18/2016   K 3.8 03/18/2016   CL 109 03/18/2016   CALCIUM 9.0 03/18/2016   MG 2.2 03/18/2016                 Neuropathy Markers Lab Results  Component Value Date   VITAMINB12 212 03/18/2016                 Bone Pathology Markers Lab Results  Component Value Date   ALKPHOS 97 03/18/2016   VD25OH 25.3 (L) 03/18/2016   VD125OH2TOT 76.2 03/18/2016   25OHVITD1 35 03/18/2016   25OHVITD2 1.6 03/18/2016   25OHVITD3 33 03/18/2016   CALCIUM 9.0 03/18/2016                 Rheumatology Markers No results found for: LABURIC, URICUR              Coagulation Parameters Lab Results  Component Value Date   PLT 214 03/08/2014                 Cardiovascular Markers Lab Results  Component Value Date   CKTOTAL 55 03/10/2014   CKMB < 0.5 (L) 02/16/2014   HGB 13.0 03/08/2014   HCT 38.9 03/08/2014                 CA Markers No results found for: CEA, CA125, LABCA2               Note: Lab results reviewed.  Recent Diagnostic Imaging Results  DG Chest 2 View CLINICAL DATA:  Pt states cough, congestion X 11 days or so now. No fever. Smoker. Hx of CHF and COPD.  EXAM: CHEST  2 VIEW  COMPARISON:  11/30/2015  FINDINGS: Normal mediastinum and cardiac silhouette. Normal pulmonary vasculature. No evidence of effusion, infiltrate, or pneumothorax. No acute bony abnormality.  Spinal stimulation leads unchanged  IMPRESSION: No acute cardiopulmonary process.  Electronically  Signed   By: Suzy Bouchard M.D.   On: 08/04/2017 16:56 DG Foot Complete Right CLINICAL DATA:  Injury with acute pain  EXAM: RIGHT FOOT COMPLETE - 3+ VIEW  COMPARISON:  None.  FINDINGS: No acute displaced  fracture or malalignment. No radiopaque foreign body in the soft tissues  IMPRESSION: No acute osseous abnormality  Electronically Signed   By: Donavan Foil M.D.   On: 08/04/2017 15:31  Complexity Note: Imaging results reviewed. Results shared with Elizabeth [name], using Layman's terms.                         Meds   Current Outpatient Medications:  .  albuterol (PROVENTIL HFA;VENTOLIN HFA) 108 (90 Base) MCG/ACT inhaler, Inhale 2 puffs into the lungs every 6 (six) hours as needed for wheezing or shortness of breath., Disp: 1 Inhaler, Rfl: 2 .  bisacodyl (DULCOLAX) 5 MG EC tablet, Take 2 tablets (10 mg total) by mouth at bedtime as needed for moderate constipation ((Hold for loose stool))., Disp: 100 tablet, Rfl: PRN .  docusate sodium (COLACE) 100 MG capsule, Take 2 capsules (200 mg total) by mouth at bedtime as needed for moderate constipation. Do not use longer than 7 days., Disp: 60 capsule, Rfl: PRN .  Melatonin 10 MG CAPS, Take 20 mg by mouth at bedtime as needed., Disp: 60 capsule, Rfl: 2 .  [START ON 11/09/2017] oxyCODONE (OXY IR/ROXICODONE) 5 MG immediate release tablet, Take 1 tablet (5 mg total) every 6 (six) hours as needed by mouth for severe pain., Disp: 120 tablet, Rfl: 0 .  polyethylene glycol (MIRALAX / GLYCOLAX) packet, Take by mouth. Reported on 12/16/2015, Disp: , Rfl:  .  [START ON 10/10/2017] oxyCODONE (OXY IR/ROXICODONE) 5 MG immediate release tablet, Take 1 tablet (5 mg total) every 6 (six) hours as needed by mouth for severe pain., Disp: 120 tablet, Rfl: 0 .  [START ON 09/10/2017] oxyCODONE (OXY IR/ROXICODONE) 5 MG immediate release tablet, Take 1 tablet (5 mg total) every 6 (six) hours as needed by mouth for severe pain., Disp: 120 tablet, Rfl: 0  ROS  Constitutional: Denies any fever or chills Gastrointestinal: No reported hemesis, hematochezia, vomiting, or acute GI distress Musculoskeletal: Denies any acute onset joint swelling, redness, loss of ROM, or  weakness Neurological: No reported episodes of acute onset apraxia, aphasia, dysarthria, agnosia, amnesia, paralysis, loss of coordination, or loss of consciousness  Allergies  Elizabeth [name] is allergic to lisinopril.  PFSH  Drug: Elizabeth [name]  reports that she does not use drugs. Alcohol:  reports that she does not drink alcohol. Tobacco:  reports that she has been smoking cigarettes.  She has been smoking about 0.50 packs per day. she has never used smokeless tobacco. Medical:  has a past medical history of Anxiety, Arm pain (07/25/2015), CHF (congestive heart failure) ([place]), Congestive heart failure ([place]) (07/25/2015), COPD (chronic obstructive pulmonary disease) ([place]), Depression, Lower extremity pain (07/25/2015), Reflux, RSD (reflex sympathetic dystrophy), Spinal cord stimulator status, Vitamin B12 deficiency, and Vitamin D deficiency. Surgical: Elizabeth [name]  has a past surgical history that includes Spinal cord stimulator implant. Family: family history includes Cancer in her father; Cirrhosis in her mother.  Constitutional Exam  General appearance: Well nourished, well developed, and well hydrated. In no apparent acute distress Vitals:   08/30/17 0924  BP: (!) 152/81  Pulse: 85  Resp: 16  Temp: 98.2 F (36.8 C)  TempSrc: Oral  SpO2: 99%  Weight: 115 lb (52.2 kg)  Height: _0  (1.448 m)   BMI Assessment: Estimated body mass index is 24.89 kg/m as calculated from the following:   Height as of this encounter: _1  (1.448 m).   Weight as of this encounter: 115 lb (52.2 kg). Psych/Mental status: Alert, oriented x 3 (person, place, & time)       Eyes: PERLA Respiratory: No evidence of acute respiratory distress  Cervical Spine Area Exam  Skin & Axial Inspection: No masses, redness, edema, swelling, or associated skin lesions Alignment: Symmetrical Functional ROM: Unrestricted ROM      Stability: No instability detected Muscle Tone/Strength: Functionally intact. No obvious  neuro-muscular anomalies detected. Sensory (Neurological): Unimpaired Palpation: No palpable anomalies              Upper Extremity (UE) Exam    Side: Right upper extremity  Side: Left upper extremity  Skin & Extremity Inspection: Skin color, temperature, and hair growth are WNL. No peripheral edema or cyanosis. No masses, redness, swelling, asymmetry, or associated skin lesions. No contractures.  Skin & Extremity Inspection: Skin color, temperature, and hair growth are WNL. No peripheral edema or cyanosis. No masses, redness, swelling, asymmetry, or associated skin lesions. No contractures.  Functional ROM: Diminished ROM          Functional ROM: Unrestricted ROM          Muscle Tone/Strength: Decreased use (0/5)  Muscle Tone/Strength: Functionally intact. No obvious neuro-muscular anomalies detected.   Thoracic Spine Area Exam  Skin & Axial Inspection: No masses, redness, or swelling Alignment: Symmetrical Functional ROM: Unrestricted ROM Stability: No instability detected Muscle Tone/Strength: Functionally intact. No obvious neuro-muscular anomalies detected. Sensory (Neurological): Unimpaired Muscle strength & Tone: No palpable anomalies  Gait & Posture Assessment  Ambulation: Unassisted Gait: Relatively normal for age and body habitus Posture: WNL   Lower Extremity Exam    Side: Right lower extremity  Side: Left lower extremity  Skin & Extremity Inspection: Skin color, temperature, and hair growth are WNL. No peripheral edema or cyanosis. No masses, redness, swelling, asymmetry, or associated skin lesions. No contractures.  Skin & Extremity Inspection: Skin color, temperature, and hair growth are WNL. No peripheral edema or cyanosis. No masses, redness, swelling, asymmetry, or associated skin lesions. No contractures.  Functional ROM: Unrestricted ROM          Functional ROM: Unrestricted ROM          Muscle Tone/Strength: Functionally intact. No obvious neuro-muscular anomalies  detected.  Muscle Tone/Strength: Functionally intact. No obvious neuro-muscular anomalies detected.  Sensory (Neurological): Unimpaired  Sensory (Neurological): Unimpaired  Palpation: No palpable anomalies  Palpation: No palpable anomalies   Assessment  Primary Diagnosis & Pertinent Problem List: The primary encounter diagnosis was Chronic low back pain. Diagnoses of Chronic pain of both lower extremities, Chronic pain of both upper extremities, Complex regional pain syndrome type 1 of both lower extremities, Complex regional pain syndrome type 1 of right upper extremity, Chronic pain syndrome, Long term current use of opiate analgesic, Insomnia secondary to chronic pain, Disorder of bone, unspecified, and Other specified health status were also pertinent to this visit.  Status Diagnosis  Controlled Controlled Controlled 1. Chronic low back pain   2. Chronic pain of both lower extremities   3. Chronic pain of both upper extremities   4. Complex regional pain syndrome type 1 of both lower extremities   5. Complex regional pain syndrome type 1 of right upper extremity   6. Chronic pain syndrome   7. Long  term current use of opiate analgesic   8. Insomnia secondary to chronic pain   9. Disorder of bone, unspecified   10. Other specified health status     Problems updated and reviewed during this visit: Problem  Chronic Obstructive Pulmonary Disease (Copd) ([facility])  Disorder of Bone, Unspecified   Plan of Care  Pharmacotherapy (Medications Ordered): Meds ordered this encounter  Medications  . oxyCODONE (OXY IR/ROXICODONE) 5 MG immediate release tablet    Sig: Take 1 tablet (5 mg total) every 6 (six) hours as needed by mouth for severe pain.    Dispense:  120 tablet    Refill:  0    Do not place this medication, or any other prescription from our practice, on "Automatic Refill". Patient may have prescription filled one day early if pharmacy is closed on scheduled refill date. Do not fill  until:11/09/2017 To last until: 12/09/2017    Order Specific Question:   Supervising Provider    Answer:   Milinda Pointer [phone]  . oxyCODONE (OXY IR/ROXICODONE) 5 MG immediate release tablet    Sig: Take 1 tablet (5 mg total) every 6 (six) hours as needed by mouth for severe pain.    Dispense:  120 tablet    Refill:  0    Do not place this medication, or any other prescription from our practice, on "Automatic Refill". Patient may have prescription filled one day early if pharmacy is closed on scheduled refill date. Do not fill until:12/30/2018To last until:11/09/2017    Order Specific Question:   Supervising Provider    Answer:   Milinda Pointer [phone]  . oxyCODONE (OXY IR/ROXICODONE) 5 MG immediate release tablet    Sig: Take 1 tablet (5 mg total) every 6 (six) hours as needed by mouth for severe pain.    Dispense:  120 tablet    Refill:  0    Do not place this medication, or any other prescription from our practice, on "Automatic Refill". Patient may have prescription filled one day early if pharmacy is closed on scheduled refill date. Do not fill until:09/10/2017 To last until: 10/10/2017    Order Specific Question:   Supervising Provider    Answer:   Milinda Pointer [phone]  This SmartLink is deprecated. Use AVSMEDLIST instead to display the medication list for a patient. Medications administered today: Elizabeth [name] had no medications administered during this visit. Lab-work, procedure(s), and/or referral(s): Orders Placed This Encounter  Procedures  . ToxASSURE Select 13 (MW), Urine  . Comp. Metabolic Panel (12)  . C-reactive protein  . Sedimentation rate  . Magnesium  . 25-Hydroxyvitamin D Lcms D2+D3  . Vitamin B12   Imaging and/or referral(s): None  Interventional therapies: Planned, scheduled, and/or pending:  Not at this time.   Considering:  Diagnostic bilateral lumbar facet block   Palliative PRN treatment(s):  Palliative stellate ganglion  block for the upper extremity complex regional pain syndrome.  Palliative Lumbar sympathetic block for the lower extremity complex regional pain syndrome.  Palliativebilateral lumbar facet block    Provider-requested follow-up: Return in about 3 months (around 11/30/2017) for MedMgmt.  Future Appointments  Date Time Provider [place]  11/16/2017 11:00 AM Vevelyn Francois, NP [facility] None   Primary Care Physician: Patient, No Pcp Per Location: [facility] [place] [facility] Note by: Vevelyn Francois NP Date: 08/30/2017; Time: 1:50 PM  Pain Score Disclaimer: We use the NRS-11 scale. This is a self-reported, subjective measurement of pain severity with only modest accuracy. It  is used primarily to identify changes within a particular patient. It must be understood that outpatient pain scales are significantly less accurate that those used for research, where they can be applied under ideal controlled circumstances with minimal exposure to variables. In reality, the score is likely to be a combination of pain intensity and pain affect, where pain affect describes the degree of emotional arousal or changes in action readiness caused by the sensory experience of pain. Factors such as social and work situation, setting, emotional state, anxiety levels, expectation, and prior pain experience may influence pain perception and show large inter-individual differences that may also be affected by time variables.  Patient instructions provided during this appointment: Patient Instructions   ____________________________________________________________________________________________  Medication Rules  Applies to: All patients receiving prescriptions (written or electronic).  Pharmacy of record: Pharmacy where electronic prescriptions will be sent. If written prescriptions are taken to a different pharmacy, please inform the nursing staff. The pharmacy listed in the electronic medical  record should be the one where you would like electronic prescriptions to be sent.  Prescription refills: Only during scheduled appointments. Applies to both, written and electronic prescriptions.  NOTE: The following applies primarily to controlled substances (Opioid* Pain Medications).   Patient's responsibilities: 1. Pain Pills: Bring all pain pills to every appointment (except for procedure appointments). 2. Pill Bottles: Bring pills in original pharmacy bottle. Always bring newest bottle. Bring bottle, even if empty. 3. Medication refills: You are responsible for knowing and keeping track of what medications you need refilled. The day before your appointment, write a list of all prescriptions that need to be refilled. Bring that list to your appointment and give it to the admitting nurse. Prescriptions will be written only during appointments. If you forget a medication, it will not be "Called in", "Faxed", or "electronically sent". You will need to get another appointment to get these prescribed. 4. Prescription Accuracy: You are responsible for carefully inspecting your prescriptions before leaving our office. Have the discharge nurse carefully go over each prescription with you, before taking them home. Make sure that your name is accurately spelled, that your address is correct. Check the name and dose of your medication to make sure it is accurate. Check the number of pills, and the written instructions to make sure they are clear and accurate. Make sure that you are given enough medication to last until your next medication refill appointment. 5. Taking Medication: Take medication as prescribed. Never take more pills than instructed. Never take medication more frequently than prescribed. Taking less pills or less frequently is permitted and encouraged, when it comes to controlled substances (written prescriptions).  6. Inform other Doctors: Always inform, all of your healthcare providers, of  all the medications you take. 7. Pain Medication from other Providers: You are not allowed to accept any additional pain medication from any other Doctor or Healthcare provider. There are two exceptions to this rule. (see below) In the event that you require additional pain medication, you are responsible for notifying us, as stated below. 8. Medication Agreement: You are responsible for carefully reading and following our Medication Agreement. This must be signed before receiving any prescriptions from our practice. Safely store a copy of your signed Agreement. Violations to the Agreement will result in no further prescriptions. (Additional copies of our Medication Agreement are available upon request.) 9. Laws, Rules, & Regulations: All patients are expected to follow all Federal and [employer], Rules, [employer]. Ignorance of the Laws does  not constitute a valid excuse. The use of any illegal substances is prohibited. 10. Adopted CDC guidelines & recommendations: Target dosing levels will be at or below 60 MME/day. Use of benzodiazepines** is not recommended.  Exceptions: There are only two exceptions to the rule of not receiving pain medications from other Healthcare Providers. 1. Exception #1 (Emergencies): In the event of an emergency (i.e.: accident requiring emergency care), you are allowed to receive additional pain medication. However, you are responsible for: As soon as you are able, call our office [phone], at any time of the day or night, and leave a message stating your name, the date and nature of the emergency, and the name and dose of the medication prescribed. In the event that your call is answered by a member of our staff, make sure to document and save the date, time, and the name of the person that took your information.  2. Exception #2 (Planned Surgery): In the event that you are scheduled by another doctor or dentist to have any type of surgery or procedure, you  are allowed (for a period no longer than 30 days), to receive additional pain medication, for the acute post-op pain. However, in this case, you are responsible for picking up a copy of our "Post-op Pain Management for Surgeons" handout, and giving it to your surgeon or dentist. This document is available at our office, and does not require an appointment to obtain it. Simply go to our office during business hours (Monday-Thursday from 8:00 AM to 4:00 PM) (Friday 8:00 AM to 12:00 Noon) or if you have a scheduled appointment with [place], prior to your surgery, and ask for it by name. In addition, you will need to provide [place] with your name, name of your surgeon, type of surgery, and date of procedure or surgery.  *Opioid medications include: morphine, codeine, oxycodone, oxymorphone, hydrocodone, hydromorphone, meperidine, tramadol, tapentadol, buprenorphine, fentanyl, methadone. **Benzodiazepine medications include: diazepam (Valium), alprazolam (Xanax), clonazepam (Klonopine), lorazepam (Ativan), clorazepate (Tranxene), chlordiazepoxide (Librium), estazolam (Prosom), oxazepam (Serax), temazepam (Restoril), triazolam (Halcion)  ____________________________________________________________________________________________

## 2017-08-30 NOTE — Progress Notes (Signed)
Nursing Pain Medication Assessment:  Safety precautions to be maintained throughout the outpatient stay will include: orient to surroundings, keep bed in low position, maintain call bell within reach at all times, provide assistance with transfer out of bed and ambulation.  Medication Inspection Compliance: Pill count conducted under aseptic conditions, in front of the patient. Neither the pills nor the bottle was removed from the patient's sight at any time. Once count was completed pills were immediately returned to the patient in their original bottle.  Medication: Oxycodone IR Pill/Patch Count: 45 of 120 pills remain Pill/Patch Appearance: Markings consistent with prescribed medication Bottle Appearance: Standard pharmacy container. Clearly labeled. Filled Date: 3510 / 31 / 2018 Last Medication intake:  Today

## 2017-08-30 NOTE — Patient Instructions (Signed)

## 2017-09-04 LAB — TOXASSURE SELECT 13 (MW), URINE

## 2017-09-05 LAB — COMP. METABOLIC PANEL (12)
ALK PHOS: 122 IU/L — AB (ref 39–117)
AST: 16 IU/L (ref 0–40)
Albumin/Globulin Ratio: 1.6 (ref 1.2–2.2)
Albumin: 4.3 g/dL (ref 3.5–5.5)
BUN/Creatinine Ratio: 17 (ref 9–23)
BUN: 12 mg/dL (ref 6–24)
Bilirubin Total: 0.3 mg/dL (ref 0.0–1.2)
CREATININE: 0.71 mg/dL (ref 0.57–1.00)
Calcium: 9.3 mg/dL (ref 8.7–10.2)
Chloride: 103 mmol/L (ref 96–106)
GFR calc Af Amer: 110 mL/min/{1.73_m2} (ref >59)
GFR, EST NON AFRICAN AMERICAN: 96 mL/min/{1.73_m2} (ref >59)
GLUCOSE: 93 mg/dL (ref 65–99)
Globulin, Total: 2.7 g/dL (ref 1.5–4.5)
Potassium: 3.8 mmol/L (ref 3.5–5.2)
SODIUM: 144 mmol/L (ref 134–144)
Total Protein: 7 g/dL (ref 6.0–8.5)

## 2017-09-05 LAB — 25-HYDROXYVITAMIN D LCMS D2+D3
25-HYDROXY, VITAMIN D-2: 1 ng/mL
25-HYDROXY, VITAMIN D-3: 36 ng/mL
25-HYDROXY, VITAMIN D: 37 ng/mL

## 2017-09-05 LAB — C-REACTIVE PROTEIN: CRP: 4.4 mg/L (ref 0.0–4.9)

## 2017-09-05 LAB — MAGNESIUM: MAGNESIUM: 2.3 mg/dL (ref 1.6–2.3)

## 2017-09-05 LAB — VITAMIN B12: Vitamin B-12: 284 pg/mL (ref 232–1245)

## 2017-09-05 LAB — SEDIMENTATION RATE: SED RATE: 30 mm/h (ref 0–40)

## 2017-09-16 ENCOUNTER — Telehealth: Payer: Self-pay | Admitting: Pain Medicine

## 2017-09-16 NOTE — Telephone Encounter (Signed)
Patient called and medicaid number given. [facility] per [facility].

## 2017-09-16 NOTE — Telephone Encounter (Addendum)
Patient now has Medicaid. [place] [employer] needs letter from Dr. Laban EmperorNaveira / [employer] [place] [facility] so she can get re-imbursed for meds she had to pay for. Please call to discuss

## 2017-09-21 ENCOUNTER — Telehealth: Payer: Self-pay | Admitting: Pain Medicine

## 2017-09-21 NOTE — Telephone Encounter (Signed)
Elizabeth [name] needs a letter to the pharmacy [facility] so [facility] can get [facility]-imbursement for what she paid on her meds. Please call patient to discuss

## 2017-09-23 ENCOUNTER — Telehealth: Payer: Self-pay

## 2017-09-23 NOTE — Telephone Encounter (Signed)
Sent PA [facility] 0801/18 [facility]

## 2017-09-27 ENCOUNTER — Telehealth: Payer: Self-pay | Admitting: Pain Medicine

## 2017-09-27 NOTE — Telephone Encounter (Signed)
Patient states she called medicaid and got things worked out.

## 2017-09-27 NOTE — Telephone Encounter (Signed)
Medicaid told [place] the information sent to them must have a retro date of 05-12-17 before they will re-imburse

## 2017-10-18 ENCOUNTER — Emergency Department
Admission: EM | Admit: 2017-10-18 | Discharge: 2017-10-18 | Disposition: A | Discharge status: Home / Self Care | Payer: Medicaid Other | Attending: Emergency Medicine | Admitting: Emergency Medicine

## 2017-10-18 ENCOUNTER — Emergency Department: Payer: Medicaid Other

## 2017-10-18 ENCOUNTER — Other Ambulatory Visit: Payer: Self-pay

## 2017-10-18 DIAGNOSIS — Z79899 Other long term (current) drug therapy: Secondary | ICD-10-CM | POA: Diagnosis not present

## 2017-10-18 DIAGNOSIS — I509 Heart failure, unspecified: Secondary | ICD-10-CM | POA: Diagnosis not present

## 2017-10-18 DIAGNOSIS — R05 Cough: Secondary | ICD-10-CM | POA: Diagnosis not present

## 2017-10-18 DIAGNOSIS — F17210 Nicotine dependence, cigarettes, uncomplicated: Secondary | ICD-10-CM | POA: Diagnosis not present

## 2017-10-18 DIAGNOSIS — R0789 Other chest pain: Secondary | ICD-10-CM | POA: Diagnosis not present

## 2017-10-18 DIAGNOSIS — J40 Bronchitis, not specified as acute or chronic: Secondary | ICD-10-CM | POA: Diagnosis not present

## 2017-10-18 DIAGNOSIS — R609 Edema, unspecified: Secondary | ICD-10-CM | POA: Insufficient documentation

## 2017-10-18 DIAGNOSIS — J449 Chronic obstructive pulmonary disease, unspecified: Secondary | ICD-10-CM | POA: Insufficient documentation

## 2017-10-18 DIAGNOSIS — R0602 Shortness of breath: Secondary | ICD-10-CM | POA: Diagnosis present

## 2017-10-18 LAB — BASIC METABOLIC PANEL
ANION GAP: 8 (ref 5–15)
BUN: 17 mg/dL (ref 6–20)
CALCIUM: 8.9 mg/dL (ref 8.9–10.3)
CO2: 25 mmol/L (ref 22–32)
Chloride: 106 mmol/L (ref 101–111)
Creatinine, Ser: 0.75 mg/dL (ref 0.44–1.00)
GFR calc Af Amer: >60 mL/min (ref >60)
GLUCOSE: 118 mg/dL — AB (ref 65–99)
Potassium: 3.7 mmol/L (ref 3.5–5.1)
Sodium: 139 mmol/L (ref 135–145)

## 2017-10-18 LAB — CBC
HCT: 38.3 % (ref 35.0–47.0)
HEMOGLOBIN: 13 g/dL (ref 12.0–16.0)
MCH: 30.8 pg (ref 26.0–34.0)
MCHC: 34 g/dL (ref 32.0–36.0)
MCV: 90.4 fL (ref 80.0–100.0)
PLATELETS: 253 10*3/uL (ref 150–440)
RBC: 4.23 MIL/uL (ref 3.80–5.20)
RDW: 14.2 % (ref 11.5–14.5)
WBC: 5.3 10*3/uL (ref 3.6–11.0)

## 2017-10-18 LAB — TROPONIN I: Troponin I: <0.03 ng/mL (ref <0.03)

## 2017-10-18 LAB — BRAIN NATRIURETIC PEPTIDE: B NATRIURETIC PEPTIDE 5: 15 pg/mL (ref 0.0–100.0)

## 2017-10-18 MED ORDER — PREDNISONE 20 MG PO TABS: 60.0000 mg | ORAL_TABLET | Freq: Every day | ORAL | 0 refills | Status: AC

## 2017-10-18 MED ORDER — FUROSEMIDE 10 MG/ML IJ SOLN
20.0000 mg | Freq: Once | INTRAMUSCULAR | Status: AC
  Administered 2017-10-18: 20 mg via INTRAVENOUS
  Filled 2017-10-18: qty 4

## 2017-10-18 MED ORDER — ALBUTEROL SULFATE HFA 108 (90 BASE) MCG/ACT IN AERS: 2.0000 | INHALATION_SPRAY | Freq: Four times a day (QID) | RESPIRATORY_TRACT | 2 refills | Status: DC | PRN

## 2017-10-18 MED ORDER — METHYLPREDNISOLONE SODIUM SUCC 125 MG IJ SOLR
125.0000 mg | Freq: Once | INTRAMUSCULAR | Status: AC
  Administered 2017-10-18: 125 mg via INTRAVENOUS
  Filled 2017-10-18: qty 2

## 2017-10-18 MED ORDER — IPRATROPIUM-ALBUTEROL 0.5-2.5 (3) MG/3ML IN SOLN
3.0000 mL | Freq: Once | RESPIRATORY_TRACT | Status: AC
  Administered 2017-10-18: 3 mL via RESPIRATORY_TRACT
  Filled 2017-10-18: qty 3

## 2017-10-18 NOTE — ED Triage Notes (Signed)
Pt states CP and fluid. Legs are swollen, 1+ edema. Denies CHF or COPD. Pt is alert, oriented, in wheelchair. States cough with productive green sputum. States she can't lay down d/t feeling SOB.

## 2017-10-18 NOTE — ED Notes (Signed)
Family at bedside. 

## 2017-10-18 NOTE — Discharge Instructions (Signed)
Your x-ray and exam show likely bronchitis which means inflammation in the lungs.  It is possible that you have chronic inflammation, occult COPD or emphysema.  You should take the prescribed medications, and make an appointment to follow-up with your primary care within the next 1-2 weeks.  Return to the emergency department immediately for new or worsening difficulty breathing, fever, cough, chest pain, or any other new or worsening symptoms that concern you.

## 2017-10-18 NOTE — ED Provider Notes (Signed)
[facility] Emergency Department Provider Note ____________________________________________   First MD Initiated Contact with Patient 10/18/17 1926     (approximate)  I have reviewed the triage vital signs and the nursing notes.   HISTORY  Chief Complaint Chest Pain and Cough    HPI Thersa SaltRita L Javed is a 57 y.o. female with past medical history of COPD, CHF (although patient denies ever being diagnosed with these) who presents with shortness of breath over the last week, gradual onset, worsening course, and associated with cough productive of green sputum and intermittent chest pain, particularly along her right lower rib area.  The patient denies associated fever or chills, but she does report bilateral lower extremity swelling over the last several weeks.  She denies a prior history of the symptoms.  Past Medical History:  Diagnosis Date  . Anxiety   . Arm pain 07/25/2015  . CHF (congestive heart failure) (HCC)   . Congestive heart failure (HCC) 07/25/2015  . COPD (chronic obstructive pulmonary disease) (HCC)    never been idagnosed  . Depression   . Lower extremity pain 07/25/2015  . Reflux   . RSD (reflex sympathetic dystrophy)   . Spinal cord stimulator status   . Vitamin B12 deficiency   . Vitamin D deficiency     Patient Active Problem List   Diagnosis Date Noted  . Disorder of bone, unspecified 08/30/2017  . Opioid-induced constipation (OIC) 09/15/2016  . Lumbar facet syndrome (Bilateral) 03/18/2016  . 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 Chronic upper extremity pain (Bilateral) 09/23/2015  . Chronic lower extremity pain (Bilateral) 09/23/2015  . Long term prescription opiate use 09/23/2015  . Encounter for chronic pain management 09/23/2015  . Elevated sedimentation rate 09/23/2015  . Chronic low back pain 07/25/2015  . Generalized anxiety disorder 07/25/2015  . Encounter for therapeutic drug level monitoring 07/25/2015  .  Long term current use of opiate analgesic 07/25/2015  . Uncomplicated opioid dependence (HCC) 07/25/2015  . Opiate use (30 MME/Day) 07/25/2015  . Chronic pain syndrome 07/25/2015  . Complex regional pain syndrome I of upper limb (Bilateral) 07/25/2015  . Complex regional pain syndrome I of lower limb (Bilateral) 07/25/2015  . Opiate analgesic use agreement exists 07/25/2015  . Presence of spinal cord stimulator 07/25/2015  . Chronic obstructive pulmonary disease (COPD) (HCC) 07/25/2015  . Congestive heart failure (HCC) 07/25/2015  . Vitamin D deficiency 07/25/2015  . Vitamin B12 deficiency 07/25/2015    Past Surgical History:  Procedure Laterality Date  . SPINAL CORD STIMULATOR IMPLANT     x 2    Prior to Admission medications   Medication Sig Start Date End Date Taking? Authorizing Provider  albuterol (PROVENTIL HFA;VENTOLIN HFA) 108 (90 Base) MCG/ACT inhaler Inhale 2 puffs into the lungs every 6 (six) hours as needed for wheezing or shortness of breath. 11/30/15   Tommi RumpsSummers, Rhonda L, PA-C  bisacodyl (DULCOLAX) 5 MG EC tablet Take 2 tablets (10 mg total) by mouth at bedtime as needed for moderate constipation ((Hold for loose stool)). 03/14/17   Barbette MerinoKing, Crystal M, NP  docusate sodium (COLACE) 100 MG capsule Take 2 capsules (200 mg total) by mouth at bedtime as needed for moderate constipation. Do not use longer than 7 days. 03/14/17   Barbette MerinoKing, Crystal M, NP  Melatonin 10 MG CAPS Take 20 mg by mouth at bedtime as needed. 06/12/17 09/10/17  Barbette MerinoKing, Crystal M, NP  oxyCODONE (OXY IR/ROXICODONE) 5 MG immediate release tablet Take 1 tablet (5 mg  total) every 6 (six) hours as needed by mouth for severe pain. 11/09/17 12/09/17  Barbette Merino, NP  oxyCODONE (OXY IR/ROXICODONE) 5 MG immediate release tablet Take 1 tablet (5 mg total) every 6 (six) hours as needed by mouth for severe pain. 10/10/17 11/09/17  Barbette Merino, NP  oxyCODONE (OXY IR/ROXICODONE) 5 MG immediate release tablet Take 1 tablet (5 mg total)  every 6 (six) hours as needed by mouth for severe pain. 09/10/17 10/10/17  Barbette Merino, NP  polyethylene glycol (MIRALAX / GLYCOLAX) packet Take by mouth. Reported on 12/16/2015    [provider]    Allergies Lisinopril  Family History  Problem Relation Age of Onset  . Cirrhosis Mother   . Cancer Father     Social History Social History   Tobacco Use  . Smoking status: Current Every Day Smoker    Packs/day: 0.50    Types: Cigarettes  . Smokeless tobacco: Never Used  . Tobacco comment: Quit 2 years ago  Substance Use Topics  . Alcohol use: No    Alcohol/week: 0.0 oz  . Drug use: No    Review of Systems  Constitutional: No fever. Eyes: No redness. ENT: No neck pain. Cardiovascular: Positive for chest pain. Respiratory: Positive for shortness of breath. Gastrointestinal: No nausea, no vomiting.   Genitourinary: Negative for flank pain.  Musculoskeletal: Negative for back pain. Skin: Negative for rash. Neurological: Negative for headache.    ____________________________________________   PHYSICAL EXAM:  VITAL SIGNS: ED Triage Vitals  Enc Vitals Group     BP 10/18/17 1813 (!) 156/76     Pulse Rate 10/18/17 1813 80     Resp 10/18/17 1813 18     Temp 10/18/17 1813 98.2 F (36.8 C)     Temp src --      SpO2 10/18/17 1813 96 %     Weight 10/18/17 1813 120 lb (54.4 kg)     Height 10/18/17 1813 4\' 9"  (1.448 m)     Head Circumference --      Peak Flow --      Pain Score 10/18/17 1812 7     Pain Loc --      Pain Edu? --      Excl. in GC? --     Constitutional: Alert and oriented.  Slightly uncomfortable appearing but in no acute distress. Eyes: Conjunctivae are normal.  Head: Atraumatic. Nose: No congestion/rhinnorhea. Mouth/Throat: Mucous membranes are moist.   Neck: Normal range of motion.  Cardiovascular: Normal rate, regular rhythm. Grossly normal heart sounds.  Good peripheral circulation. Respiratory: Normal respiratory effort.  Bilateral  moderate wheeze with good air movement.  Somewhat decreased breath sounds bilaterally. Gastrointestinal: Soft and nontender. No distention.  Genitourinary: No flank tenderness. Musculoskeletal: 1+ bilateral lower extremity edema.  Extremities warm and well perfused.  Neurologic:  Normal speech and language. No gross focal neurologic deficits are appreciated.  Skin:  Skin is warm and dry. No rash noted. Psychiatric: Mood and affect are normal. Speech and behavior are normal.  ____________________________________________   LABS (all labs ordered are listed, but only abnormal results are displayed)  Labs Reviewed  BASIC METABOLIC PANEL - Abnormal; Notable for the following components:      Result Value   Glucose, Bld 118 (*)    All other components within normal limits  CBC  TROPONIN I  BRAIN NATRIURETIC PEPTIDE  TROPONIN I  TROPONIN I  POC URINE PREG, ED   ____________________________________________  EKG  ED ECG  REPORT I, Dionne Bucy, the attending physician, personally viewed and interpreted this ECG.  Date: 10/18/2017 EKG Time: 1818 Rate: 82 Rhythm: normal sinus rhythm QRS Axis: normal Intervals: normal ST/T Wave abnormalities: normal Narrative Interpretation: no evidence of acute ischemia; no significant change when compared to EKG from 04/11/2010  ____________________________________________  RADIOLOGY  CXR: No focal infiltrate or evidence of pulmonary edema ____________________________________________   PROCEDURES  Procedure(s) performed: No    Critical Care performed: No ____________________________________________   INITIAL IMPRESSION / ASSESSMENT AND PLAN / ED COURSE  Pertinent labs & imaging results that were available during my care of the patient were reviewed by me and considered in my medical decision making (see chart for details).  57 year old female with documented past medical history as above, although she denies having been  diagnosed with CHF or COPD presents with gradual onset shortness of breath over the last several days, associated with productive cough, as well as with some intermittent chest pain and bilateral lower extremity edema over the last several weeks.  She denies prior history of the symptoms.  On exam, the patient is slightly uncomfortable but not acutely ill-appearing, and not in acute respiratory distress.  Her vital signs are normal and her O2 sat specifically is in the mid to high 90s on room air.  She does have bilateral moderate wheezing, as well as pitting edema to lower extremities bilaterally.  Past medical records reviewed in Epic and are noncontributory; I confirmed the fact that CHF and COPD have been documented previously, however there is no echocardiogram or other specific workup available in epic to confirm these diagnoses directly.  The patient is not on any medications to treat these conditions.  Presentation is most consistent with COPD versus possible acute bronchitis especially given the negative chest x-ray.  Although the patient has some peripheral edema given that her x-ray does not show pulmonary edema I have a lower suspicion for new onset CHF.  There is no evidence on the x-ray of pneumonia, and given the lack of hypoxia or tachycardia as well as the abnormal breath sounds there is no evidence for PE.  I have a low suspicion for ACS given the lack of EKG changes.  Plan: Empiric treatment for COPD/bronchitis with steroid and nebulizer, IV Lasix for the peripheral edema, basic labs, and cardiac enzymes x2.  Given that the patient has normal vital signs, has no hypoxia or respiratory distress, and no x-ray findings, she likely will be able to be discharged if the workup is negative and she is feeling somewhat better.  ----------------------------------------- 8:45 PM on 10/18/2017 -----------------------------------------  I am signing out the patient to the oncoming physician Dr.  Sharma Covert.  She will follow-up on the repeat troponin in approximately 1 hour, and reassess the patient for possible discharge.  ____________________________________________   FINAL CLINICAL IMPRESSION(S) / ED DIAGNOSES  Final diagnoses:  Bronchitis  Peripheral edema      NEW MEDICATIONS STARTED DURING THIS VISIT:  This SmartLink is deprecated. Use AVSMEDLIST instead to display the medication list for a patient.   Note:  This document was prepared using Dragon voice recognition software and may include unintentional dictation errors.    Dionne Bucy, MD 10/18/17 2045

## 2017-11-16 ENCOUNTER — Other Ambulatory Visit: Payer: Self-pay

## 2017-11-16 ENCOUNTER — Ambulatory Visit: Payer: Medicaid Other | Attending: Nurse Practitioner | Admitting: Nurse Practitioner

## 2017-11-16 ENCOUNTER — Other Ambulatory Visit: Payer: Self-pay | Admitting: Nurse Practitioner

## 2017-11-16 ENCOUNTER — Encounter: Payer: Self-pay | Admitting: Nurse Practitioner

## 2017-11-16 VITALS — BP 124/104 | HR 82 | Temp 98.6°F | Resp 16 | Ht <= 58 in | Wt 120.0 lb

## 2017-11-16 DIAGNOSIS — G894 Chronic pain syndrome: Secondary | ICD-10-CM | POA: Diagnosis not present

## 2017-11-16 DIAGNOSIS — J449 Chronic obstructive pulmonary disease, unspecified: Secondary | ICD-10-CM | POA: Diagnosis not present

## 2017-11-16 DIAGNOSIS — G8929 Other chronic pain: Secondary | ICD-10-CM | POA: Diagnosis not present

## 2017-11-16 DIAGNOSIS — E559 Vitamin D deficiency, unspecified: Secondary | ICD-10-CM | POA: Insufficient documentation

## 2017-11-16 DIAGNOSIS — F329 Major depressive disorder, single episode, unspecified: Secondary | ICD-10-CM | POA: Diagnosis not present

## 2017-11-16 DIAGNOSIS — I509 Heart failure, unspecified: Secondary | ICD-10-CM | POA: Insufficient documentation

## 2017-11-16 DIAGNOSIS — G90511 Complex regional pain syndrome I of right upper limb: Secondary | ICD-10-CM | POA: Insufficient documentation

## 2017-11-16 DIAGNOSIS — G4700 Insomnia, unspecified: Secondary | ICD-10-CM | POA: Diagnosis not present

## 2017-11-16 DIAGNOSIS — G4701 Insomnia due to medical condition: Secondary | ICD-10-CM

## 2017-11-16 DIAGNOSIS — M47816 Spondylosis without myelopathy or radiculopathy, lumbar region: Secondary | ICD-10-CM | POA: Diagnosis not present

## 2017-11-16 DIAGNOSIS — Z79891 Long term (current) use of opiate analgesic: Secondary | ICD-10-CM | POA: Diagnosis not present

## 2017-11-16 DIAGNOSIS — M899 Disorder of bone, unspecified: Secondary | ICD-10-CM | POA: Diagnosis not present

## 2017-11-16 DIAGNOSIS — G90523 Complex regional pain syndrome I of lower limb, bilateral: Secondary | ICD-10-CM

## 2017-11-16 DIAGNOSIS — Z79899 Other long term (current) drug therapy: Secondary | ICD-10-CM | POA: Diagnosis not present

## 2017-11-16 DIAGNOSIS — Z5181 Encounter for therapeutic drug level monitoring: Secondary | ICD-10-CM | POA: Insufficient documentation

## 2017-11-16 DIAGNOSIS — K219 Gastro-esophageal reflux disease without esophagitis: Secondary | ICD-10-CM | POA: Diagnosis not present

## 2017-11-16 DIAGNOSIS — F17210 Nicotine dependence, cigarettes, uncomplicated: Secondary | ICD-10-CM | POA: Insufficient documentation

## 2017-11-16 MED ORDER — OXYCODONE HCL 5 MG PO TABS: 5.0000 mg | ORAL_TABLET | Freq: Four times a day (QID) | ORAL | 0 refills | Status: DC | PRN

## 2017-11-16 NOTE — Progress Notes (Signed)
Patient's Name: Elizabeth [name]  MRN: [id number]  Referring Provider: No ref. provider found  DOB: November 12, 1960  PCP: [facility] [place]  DOS: 11/16/2017  Note by: Vevelyn Francois NP  Service setting: Ambulatory outpatient  Specialty: Interventional Pain Management  Location: [facility] (AMB) Pain Management Facility    Patient type: Established    Primary Reason(s) for Visit: Encounter for prescription drug management. (Level of risk: moderate)  CC: Hand Pain (right)  HPI  Elizabeth [name] is a 57 y.o. year old, female patient, who comes today for a medication management evaluation. She has Chronic low back pain; Generalized anxiety disorder; Encounter for therapeutic drug level monitoring; Long term current use of opiate analgesic; Uncomplicated opioid dependence ([place]); Opiate use (30 MME/Day); Chronic pain syndrome; Complex regional pain syndrome I of upper limb (Bilateral); Complex regional pain syndrome I of lower limb (Bilateral); Opiate analgesic use agreement exists; Presence of spinal cord stimulator; Chronic obstructive pulmonary disease (COPD) ([place]); Congestive heart failure ([place]); Vitamin D deficiency; Vitamin B12 deficiency; Chronic upper extremity pain (Bilateral); Chronic lower extremity pain (Bilateral); Long term prescription opiate use; Encounter for chronic pain management; Elevated sedimentation rate; Insomnia secondary to chronic pain; Lumbar facet syndrome (Bilateral); Opioid-induced constipation (OIC); and Disorder of bone, unspecified on their problem list. Her primarily concern today is the Hand Pain (right)  Pain Assessment: Location: Right Hand Radiating: moves to all fingers on right hand Onset: More than a month ago Duration: Chronic pain Quality: Constant, Aching, Burning Severity: 3 /10 (self-reported pain score)  Note: Reported level is compatible with observation.                          Effect on ADL: pt states there is nothing she cannot do because of the pain, it  just hurts all the time Timing: Constant Modifying factors: medications  Elizabeth [name] was last scheduled for an appointment on 08/30/2017 for medication management. During today's appointment we reviewed Elizabeth [name]'s chronic pain status, as well as her outpatient medication regimen. She has numbness, tingling and weakness in the right hand. She admits that is worse that the leg pain. She also has lower back pain . She denies any radiating of pain from her back. She does have the SCS in-place she denies any concerns with this.   The patient  reports that she does not use drugs. Her body mass index is 25.97 kg/m.  Further details on both, my assessment(s), as well as the proposed treatment plan, please see below.  Controlled Substance Pharmacotherapy Assessment REMS (Risk Evaluation and Mitigation Strategy)  Analgesic:Oxycodone IR 5 mg 1 tablet by mouth every 6 hours (20 mg/day) MME/day:30 mg/day .   Rise Patience  11/16/2017 11:14 AM  Signed Nursing Pain Medication Assessment:  Safety precautions to be maintained throughout the outpatient stay will include: orient to surroundings, keep bed in low position, maintain call bell within reach at all times, provide assistance with transfer out of bed and ambulation.  Medication Inspection Compliance: Pill count conducted under aseptic conditions, in front of the patient. Neither the pills nor the bottle was removed from the patient's sight at any time. Once count was completed pills were immediately returned to the patient in their original bottle.  Medication: Oxycodone IR Pill/Patch Count: 87 of 120 pills remain Pill/Patch Appearance: Markings consistent with prescribed medication Bottle Appearance: Standard pharmacy container. Clearly labeled. Filled Date: 1 / 29 / 2019 Last Medication intake:  Today   Pharmacokinetics:  Liberation and absorption (onset of action): WNL Distribution (time to peak effect): WNL Metabolism and excretion  (duration of action): WNL         Pharmacodynamics: Desired effects: Analgesia: Elizabeth [name] reports >50% benefit. Functional ability: Patient reports that medication allows her to accomplish basic ADLs Clinically meaningful improvement in function (CMIF): Sustained CMIF goals met Perceived effectiveness: Described as relatively effective, allowing for increase in activities of daily living (ADL) Undesirable effects: Side-effects or Adverse reactions: None reported Monitoring: [place] PMP: Online review of the past 45-monthperiod conducted. Compliant with practice rules and regulations Last UDS on record: Summary  Date Value Ref Range Status  08/30/2017 FINAL  Final    Comment:    ==================================================================== TOXASSURE SELECT 13 (MW) ==================================================================== Test                             Result       Flag       Units Drug Present and Declared for Prescription Verification   Oxycodone                      521          EXPECTED   ng/mg creat   Oxymorphone                    770          EXPECTED   ng/mg creat   Noroxycodone                   1402         EXPECTED   ng/mg creat    Sources of oxycodone include scheduled prescription medications.    Oxymorphone and noroxycodone are expected metabolites of    oxycodone. Oxymorphone is also available as a scheduled    prescription medication. ==================================================================== Test                      Result    Flag   Units      Ref Range   Creatinine              43               mg/dL      >=20 ==================================================================== Declared Medications:  The flagging and interpretation on this report are based on the  following declared medications.  Unexpected results may arise from  inaccuracies in the declared medications.  **Note: The testing scope of this panel includes these  medications:  Oxycodone  **Note: The testing scope of this panel does not include following  reported medications:  Albuterol  Bisacodyl  Docusate  Melatonin  Polyethylene Glycol ==================================================================== For clinical consultation, please call [phone] ====================================================================    UDS interpretation: Compliant          Medication Assessment Form: Reviewed. Patient indicates being compliant with therapy Treatment compliance: Compliant Risk Assessment Profile: Aberrant behavior: See prior evaluations. None observed or detected today Comorbid factors increasing risk of overdose: See prior notes. No additional risks detected today Risk of substance use disorder (SUD): Low Opioid Risk Tool - 11/16/17 1108      Family History of Substance Abuse   Alcohol  Negative    Illegal Drugs  Negative    Rx Drugs  Negative      Personal History of Substance Abuse   Alcohol  Negative    Illegal Drugs  Negative    Rx Drugs  Negative      Age   Age between 32-45 years   No      Psychological Disease   Psychological Disease  Negative    Depression  Negative      Total Score   Opioid Risk Tool Scoring  0    Opioid Risk Interpretation  Low Risk      ORT Scoring interpretation table:  Score <3 = Low Risk for SUD  Score between 4-7 = Moderate Risk for SUD  Score >8 = High Risk for Opioid Abuse   Risk Mitigation Strategies:  Patient Counseling: Covered Patient-Prescriber Agreement (PPA): Present and active  Notification to other healthcare providers: Done  Pharmacologic Plan: No change in therapy, at this time.             Laboratory Chemistry  Inflammation Markers (CRP: Acute Phase) (ESR: Chronic Phase) Lab Results  Component Value Date   CRP 4.4 08/30/2017   ESRSEDRATE 30 08/30/2017                 Rheumatology Markers No results found for: RF, ANA, Therisa Doyne,  [facility]              Renal Function Markers Lab Results  Component Value Date   BUN 17 10/18/2017   CREATININE 0.75 10/18/2017   GFRAA >60 10/18/2017   GFRNONAA >60 10/18/2017                 Hepatic Function Markers Lab Results  Component Value Date   AST 16 08/30/2017   ALT 12 (L) 03/18/2016   ALBUMIN 4.3 08/30/2017   ALKPHOS 122 (H) 08/30/2017                 Electrolytes Lab Results  Component Value Date   NA 139 10/18/2017   K 3.7 10/18/2017   CL 106 10/18/2017   CALCIUM 8.9 10/18/2017   MG 2.3 08/30/2017                 Neuropathy Markers Lab Results  Component Value Date   VITAMINB12 284 08/30/2017                 Bone Pathology Markers Lab Results  Component Value Date   VD25OH 25.3 (L) 03/18/2016   VD125OH2TOT 76.2 03/18/2016   25OHVITD1 37 08/30/2017   25OHVITD2 1.0 08/30/2017   25OHVITD3 36 08/30/2017                 Coagulation Parameters Lab Results  Component Value Date   PLT 253 10/18/2017                 Cardiovascular Markers Lab Results  Component Value Date   BNP 15.0 10/18/2017   CKTOTAL 55 03/10/2014   CKMB < 0.5 (L) 02/16/2014   TROPONINI <0.03 10/18/2017   HGB 13.0 10/18/2017   HCT 38.3 10/18/2017                 CA Markers No results found for: CEA, CA125, LABCA2               Note: Lab results reviewed.  Recent Diagnostic Imaging Results  DG Chest 2 View CLINICAL DATA:  Cough with productive green sputum.  EXAM: CHEST  2 VIEW  COMPARISON:  08/04/2017  FINDINGS: Spinal stimulator with leads in stable position.  Cardiomediastinal silhouette is normal. Mediastinal contours appear intact.  There is no evidence of focal airspace  consolidation, pleural effusion or pneumothorax.  Osseous structures are without acute abnormality. Soft tissues are grossly normal.  IMPRESSION: No active cardiopulmonary disease.  Electronically Signed   By: Fidela Salisbury M.D.   On: 10/18/2017 18:43  Complexity  Note: Imaging results reviewed. Results shared with Elizabeth [name], using Layman's terms.                         Meds   Current Outpatient Medications:  .  albuterol (PROVENTIL HFA;VENTOLIN HFA) 108 (90 Base) MCG/ACT inhaler, Inhale 2 puffs into the lungs every 6 (six) hours as needed for wheezing or shortness of breath., Disp: 1 Inhaler, Rfl: 2 .  bisacodyl (DULCOLAX) 5 MG EC tablet, Take 2 tablets (10 mg total) by mouth at bedtime as needed for moderate constipation ((Hold for loose stool))., Disp: 100 tablet, Rfl: PRN .  docusate sodium (COLACE) 100 MG capsule, Take 2 capsules (200 mg total) by mouth at bedtime as needed for moderate constipation. Do not use longer than 7 days., Disp: 60 capsule, Rfl: PRN .  Melatonin 10 MG CAPS, Take 20 mg by mouth at bedtime as needed., Disp: 60 capsule, Rfl: 2 .  [START ON 02/07/2018] oxyCODONE (OXY IR/ROXICODONE) 5 MG immediate release tablet, Take 1 tablet (5 mg total) by mouth every 6 (six) hours as needed for severe pain., Disp: 120 tablet, Rfl: 0 .  polyethylene glycol (MIRALAX / GLYCOLAX) packet, Take by mouth. Reported on 12/16/2015, Disp: , Rfl:  .  [START ON 01/08/2018] oxyCODONE (OXY IR/ROXICODONE) 5 MG immediate release tablet, Take 1 tablet (5 mg total) by mouth every 6 (six) hours as needed for severe pain., Disp: 120 tablet, Rfl: 0 .  [START ON 12/09/2017] oxyCODONE (OXY IR/ROXICODONE) 5 MG immediate release tablet, Take 1 tablet (5 mg total) by mouth every 6 (six) hours as needed for severe pain., Disp: 120 tablet, Rfl: 0  ROS  Constitutional: Denies any fever or chills Gastrointestinal: No reported hemesis, hematochezia, vomiting, or acute GI distress Musculoskeletal: Denies any acute onset joint swelling, redness, loss of ROM, or weakness Neurological: No reported episodes of acute onset apraxia, aphasia, dysarthria, agnosia, amnesia, paralysis, loss of coordination, or loss of consciousness  Allergies  Elizabeth [name] is allergic to  lisinopril.  PFSH  Drug: Elizabeth [name]  reports that she does not use drugs. Alcohol:  reports that she does not drink alcohol. Tobacco:  reports that she has been smoking cigarettes.  She has been smoking about 0.50 packs per day. she has never used smokeless tobacco. Medical:  has a past medical history of Anxiety, Arm pain (07/25/2015), CHF (congestive heart failure) ([place]), Congestive heart failure ([place]) (07/25/2015), COPD (chronic obstructive pulmonary disease) ([place]), Depression, Lower extremity pain (07/25/2015), Reflux, RSD (reflex sympathetic dystrophy), Spinal cord stimulator status, Vitamin B12 deficiency, and Vitamin D deficiency. Surgical: Elizabeth [name]  has a past surgical history that includes Spinal cord stimulator implant. Family: family history includes Cancer in her father; Cirrhosis in her mother.  Constitutional Exam  General appearance: Well nourished, well developed, and well hydrated. In no apparent acute distress Vitals:   11/16/17 1102  BP: (!) 124/104  Pulse: 82  Resp: 16  Temp: 98.6 F (37 C)  TempSrc: Oral  SpO2: 96%  Weight: 120 lb (54.4 kg)  Height: '4\' 9"'$  (1.448 m)   BMI Assessment: Estimated body mass index is 25.97 kg/m as calculated from the following:   Height as of this encounter: '4\' 9"'$  (1.448 m).  Weight as of this encounter: 120 lb (54.4 kg). Psych/Mental status: Alert, oriented x 3 (person, place, & time)       Eyes: PERLA Respiratory: No evidence of acute respiratory distress  Cervical Spine Area Exam  Skin & Axial Inspection: No masses, redness, edema, swelling, or associated skin lesions Alignment: Symmetrical Functional ROM: Unrestricted ROM      Stability: No instability detected Muscle Tone/Strength: Functionally intact. No obvious neuro-muscular anomalies detected. Sensory (Neurological): Unimpaired Palpation: No palpable anomalies              Upper Extremity (UE) Exam    Side: Right upper extremity  Side: Left upper extremity  Skin  & Extremity Inspection: Skin color, temperature, and hair growth are WNL. No peripheral edema or cyanosis. No masses, redness, swelling, asymmetry, or associated skin lesions. No contractures.  Skin & Extremity Inspection: Skin color, temperature, and hair growth are WNL. No peripheral edema or cyanosis. No masses, redness, swelling, asymmetry, or associated skin lesions. No contractures.  Functional ROM: Unrestricted ROM          Functional ROM: Unrestricted ROM          Muscle Tone/Strength: Functionally intact. No obvious neuro-muscular anomalies detected.  Muscle Tone/Strength: Functionally intact. No obvious neuro-muscular anomalies detected.  Sensory (Neurological): Unimpaired          Sensory (Neurological): Unimpaired          Palpation: No palpable anomalies              Palpation: No palpable anomalies              Specialized Test(s): Deferred         Specialized Test(s): Deferred          Thoracic Spine Area Exam  Skin & Axial Inspection: No masses, redness, or swelling Alignment: Symmetrical Functional ROM: Unrestricted ROM Stability: No instability detected Muscle Tone/Strength: Functionally intact. No obvious neuro-muscular anomalies detected. Sensory (Neurological): Unimpaired Muscle strength & Tone: No palpable anomalies  Lumbar Spine Area Exam  Skin & Axial Inspection: Well healed scar from previous spine surgery detected SCS  Alignment: Symmetrical  Functional ROM: Unrestricted ROM      Stability: No instability detected Muscle Tone/Strength: Functionally intact. No obvious neuro-muscular anomalies detected. Sensory (Neurological): Unimpaired Palpation: Complains of area being tender to palpation       Provocative Tests: Lumbar Hyperextension and rotation test: evaluation deferred today       Lumbar Lateral bending test: evaluation deferred today       Patrick's Maneuver: evaluation deferred today                    Gait & Posture Assessment  Ambulation:  Unassisted Gait: Relatively normal for age and body habitus Posture: WNL   Lower Extremity Exam    Side: Right lower extremity  Side: Left lower extremity  Skin & Extremity Inspection: Skin color, temperature, and hair growth are WNL. No peripheral edema or cyanosis. No masses, redness, swelling, asymmetry, or associated skin lesions. No contractures.  Skin & Extremity Inspection: Skin color, temperature, and hair growth are WNL. No peripheral edema or cyanosis. No masses, redness, swelling, asymmetry, or associated skin lesions. No contractures.  Functional ROM: Unrestricted ROM          Functional ROM: Unrestricted ROM          Muscle Tone/Strength: Functionally intact. No obvious neuro-muscular anomalies detected.  Muscle Tone/Strength: Functionally intact. No obvious neuro-muscular anomalies detected.  Sensory (Neurological):  Unimpaired  Sensory (Neurological): Unimpaired  Palpation: No palpable anomalies  Palpation: No palpable anomalies   Assessment  Primary Diagnosis & Pertinent Problem List: The primary encounter diagnosis was Complex regional pain syndrome type 1 of right upper extremity. Diagnoses of Complex regional pain syndrome type 1 of both lower extremities, Lumbar facet syndrome (Bilateral), Chronic pain syndrome, Insomnia secondary to chronic pain, and Long term current use of opiate analgesic were also pertinent to this visit.  Status Diagnosis  Controlled Controlled Controlled 1. 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 Complex regional pain syndrome type 1 of both lower extremities   3. Lumbar facet syndrome (Bilateral)   4. Chronic pain syndrome   5. Insomnia secondary to chronic pain   6. Long term current use of opiate analgesic     Problems updated and reviewed during this visit: No problems updated. Plan of Care  Pharmacotherapy (Medications Ordered): Meds ordered this encounter  Medications  . oxyCODONE (OXY IR/ROXICODONE) 5 MG  immediate release tablet    Sig: Take 1 tablet (5 mg total) by mouth every 6 (six) hours as needed for severe pain.    Dispense:  120 tablet    Refill:  0    Do not place this medication, or any other prescription from our practice, on "Automatic Refill". Patient may have prescription filled one day early if pharmacy is closed on scheduled refill date. Do not fill until:02/07/2018 To last until:03/09/2018    Order Specific Question:   Supervising Provider    Answer:   Milinda Pointer [phone]  . oxyCODONE (OXY IR/ROXICODONE) 5 MG immediate release tablet    Sig: Take 1 tablet (5 mg total) by mouth every 6 (six) hours as needed for severe pain.    Dispense:  120 tablet    Refill:  0    Do not place this medication, or any other prescription from our practice, on "Automatic Refill". Patient may have prescription filled one day early if pharmacy is closed on scheduled refill date. Do not fill until:01/08/2018 To last until:02/07/2018    Order Specific Question:   Supervising Provider    Answer:   Milinda Pointer [phone]  . oxyCODONE (OXY IR/ROXICODONE) 5 MG immediate release tablet    Sig: Take 1 tablet (5 mg total) by mouth every 6 (six) hours as needed for severe pain.    Dispense:  120 tablet    Refill:  0    Do not place this medication, or any other prescription from our practice, on "Automatic Refill". Patient may have prescription filled one day early if pharmacy is closed on scheduled refill date. Do not fill until:12/09/2017 To last until: 01/08/2018    Order Specific Question:   Supervising Provider    Answer:   Milinda Pointer [phone]   New Prescriptions   No medications on file   Medications administered today: Latashia L. Bitton had no medications administered during this visit. Lab-work, procedure(s), and/or referral(s): Orders Placed This Encounter  Procedures  . ToxASSURE Select 13 (MW), Urine   Imaging and/or referral(s): None  Interventional  therapies: Planned, scheduled, and/or pending:  Not at this time.   Considering:  Diagnostic bilateral lumbar facet block   Palliative PRN treatment(s):  Palliative stellate ganglion block for the upper extremity complex regional pain syndrome.  Palliative Lumbar sympathetic block for the lower extremity complex regional pain syndrome.  Palliativebilateral lumbar facet block      Provider-requested follow-up: Return in about 3 months (around  02/23/2018) for MedMgmt with Me Dionisio David).  No future appointments. Primary Care Physician: [facility] [place] Location: [facility] Outpatient Pain Management Facility Note by: Vevelyn Francois NP Date: 11/16/2017; Time: 11:40 AM  Pain Score Disclaimer: We use the NRS-11 scale. This is a self-reported, subjective measurement of pain severity with only modest accuracy. It is used primarily to identify changes within a particular patient. It must be understood that outpatient pain scales are significantly less accurate that those used for research, where they can be applied under ideal controlled circumstances with minimal exposure to variables. In reality, the score is likely to be a combination of pain intensity and pain affect, where pain affect describes the degree of emotional arousal or changes in action readiness caused by the sensory experience of pain. Factors such as social and work situation, setting, emotional state, anxiety levels, expectation, and prior pain experience may influence pain perception and show large inter-individual differences that may also be affected by time variables.  Patient instructions provided during this appointment: Patient Instructions   ____________________________________________________________________________________________  Medication Rules  Applies to: All patients receiving prescriptions (written or electronic).  Pharmacy of record: Pharmacy where electronic prescriptions will be sent.  If written prescriptions are taken to a different pharmacy, please inform the nursing staff. The pharmacy listed in the electronic medical record should be the one where you would like electronic prescriptions to be sent.  Prescription refills: Only during scheduled appointments. Applies to both, written and electronic prescriptions.  NOTE: The following applies primarily to controlled substances (Opioid* Pain Medications).   Patient's responsibilities: 1. Pain Pills: Bring all pain pills to every appointment (except for procedure appointments). 2. Pill Bottles: Bring pills in original pharmacy bottle. Always bring newest bottle. Bring bottle, even if empty. 3. Medication refills: You are responsible for knowing and keeping track of what medications you need refilled. The day before your appointment, write a list of all prescriptions that need to be refilled. Bring that list to your appointment and give it to the admitting nurse. Prescriptions will be written only during appointments. If you forget a medication, it will not be "Called in", "Faxed", or "electronically sent". You will need to get another appointment to get these prescribed. 4. Prescription Accuracy: You are responsible for carefully inspecting your prescriptions before leaving our office. Have the discharge nurse carefully go over each prescription with you, before taking them home. Make sure that your name is accurately spelled, that your address is correct. Check the name and dose of your medication to make sure it is accurate. Check the number of pills, and the written instructions to make sure they are clear and accurate. Make sure that you are given enough medication to last until your next medication refill appointment. 5. Taking Medication: Take medication as prescribed. Never take more pills than instructed. Never take medication more frequently than prescribed. Taking less pills or less frequently is permitted and encouraged, when  it comes to controlled substances (written prescriptions).  6. Inform other Doctors: Always inform, all of your healthcare providers, of all the medications you take. 7. Pain Medication from other Providers: You are not allowed to accept any additional pain medication from any other Doctor or Healthcare provider. There are two exceptions to this rule. (see below) In the event that you require additional pain medication, you are responsible for notifying us, as stated below. 8. Medication Agreement: You are responsible for carefully reading and following our Medication Agreement. This must be signed before receiving  any prescriptions from our practice. Safely store a copy of your signed Agreement. Violations to the Agreement will result in no further prescriptions. (Additional copies of our Medication Agreement are available upon request.) 9. Laws, Rules, & Regulations: All patients are expected to follow all Federal and [employer], [employer], Rules, [employer] [facility]. Ignorance of the Laws does not constitute a valid excuse. The use of any illegal substances is prohibited. 10. Adopted CDC guidelines & recommendations: Target dosing levels will be at or below 60 MME/day. Use of benzodiazepines** is not recommended.  Exceptions: There are only two exceptions to the rule of not receiving pain medications from other Healthcare Providers. 1. Exception #1 (Emergencies): In the event of an emergency (i.e.: accident requiring emergency care), you are allowed to receive additional pain medication. However, you are responsible for: As soon as you are able, call our office [phone], at any time of the day or night, and leave a message stating your name, the date and nature of the emergency, and the name and dose of the medication prescribed. In the event that your call is answered by a member of our staff, make sure to document and save the date, time, and the name of the person that took your information.   2. Exception #2 (Planned Surgery): In the event that you are scheduled by another doctor or dentist to have any type of surgery or procedure, you are allowed (for a period no longer than 30 days), to receive additional pain medication, for the acute post-op pain. However, in this case, you are responsible for picking up a copy of our "Post-op Pain Management for Surgeons" handout, and giving it to your surgeon or dentist. This document is available at our office, and does not require an appointment to obtain it. Simply go to our office during business hours (Monday-Thursday from 8:00 AM to 4:00 PM) (Friday 8:00 AM to 12:00 Noon) or if you have a scheduled appointment with [place], prior to your surgery, and ask for it by name. In addition, you will need to provide [place] with your name, name of your surgeon, type of surgery, and date of procedure or surgery.  *Opioid medications include: morphine, codeine, oxycodone, oxymorphone, hydrocodone, hydromorphone, meperidine, tramadol, tapentadol, buprenorphine, fentanyl, methadone. **Benzodiazepine medications include: diazepam (Valium), alprazolam (Xanax), clonazepam (Klonopine), lorazepam (Ativan), clorazepate (Tranxene), chlordiazepoxide (Librium), estazolam (Prosom), oxazepam (Serax), temazepam (Restoril), triazolam (Halcion)  ____________________________________________________________________________________________

## 2017-11-16 NOTE — Patient Instructions (Signed)

## 2017-11-16 NOTE — Progress Notes (Signed)
Nursing Pain Medication Assessment:  Safety precautions to be maintained throughout the outpatient stay will include: orient to surroundings, keep bed in low position, maintain call bell within reach at all times, provide assistance with transfer out of bed and ambulation.  Medication Inspection Compliance: Pill count conducted under aseptic conditions, in front of the patient. Neither the pills nor the bottle was removed from the patient's sight at any time. Once count was completed pills were immediately returned to the patient in their original bottle.  Medication: Oxycodone IR Pill/Patch Count: 87 of 120 pills remain Pill/Patch Appearance: Markings consistent with prescribed medication Bottle Appearance: Standard pharmacy container. Clearly labeled. Filled Date: 1 / 29 / 2019 Last Medication intake:  Today

## 2017-11-20 LAB — TOXASSURE SELECT 13 (MW), URINE

## 2017-11-23 ENCOUNTER — Emergency Department: Payer: Medicaid Other

## 2017-11-23 ENCOUNTER — Other Ambulatory Visit: Payer: Self-pay

## 2017-11-23 ENCOUNTER — Encounter: Payer: Self-pay | Admitting: Emergency Medicine

## 2017-11-23 ENCOUNTER — Inpatient Hospital Stay
Admission: EM | Admit: 2017-11-23 | Discharge: 2017-11-26 | DRG: 192 | Disposition: A | Discharge status: Home / Self Care | Payer: Medicaid Other | Attending: Internal Medicine | Admitting: Internal Medicine

## 2017-11-23 DIAGNOSIS — F17210 Nicotine dependence, cigarettes, uncomplicated: Secondary | ICD-10-CM | POA: Diagnosis present

## 2017-11-23 DIAGNOSIS — K219 Gastro-esophageal reflux disease without esophagitis: Secondary | ICD-10-CM | POA: Diagnosis present

## 2017-11-23 DIAGNOSIS — J440 Chronic obstructive pulmonary disease with acute lower respiratory infection: Secondary | ICD-10-CM | POA: Diagnosis present

## 2017-11-23 DIAGNOSIS — E876 Hypokalemia: Secondary | ICD-10-CM | POA: Diagnosis not present

## 2017-11-23 DIAGNOSIS — I509 Heart failure, unspecified: Secondary | ICD-10-CM | POA: Diagnosis present

## 2017-11-23 DIAGNOSIS — K5903 Drug induced constipation: Secondary | ICD-10-CM

## 2017-11-23 DIAGNOSIS — J209 Acute bronchitis, unspecified: Secondary | ICD-10-CM | POA: Diagnosis present

## 2017-11-23 DIAGNOSIS — T402X5A Adverse effect of other opioids, initial encounter: Secondary | ICD-10-CM

## 2017-11-23 DIAGNOSIS — K5900 Constipation, unspecified: Secondary | ICD-10-CM | POA: Diagnosis present

## 2017-11-23 DIAGNOSIS — F419 Anxiety disorder, unspecified: Secondary | ICD-10-CM | POA: Diagnosis present

## 2017-11-23 DIAGNOSIS — Z79899 Other long term (current) drug therapy: Secondary | ICD-10-CM | POA: Diagnosis not present

## 2017-11-23 DIAGNOSIS — F329 Major depressive disorder, single episode, unspecified: Secondary | ICD-10-CM | POA: Diagnosis present

## 2017-11-23 DIAGNOSIS — R109 Unspecified abdominal pain: Secondary | ICD-10-CM

## 2017-11-23 DIAGNOSIS — J441 Chronic obstructive pulmonary disease with (acute) exacerbation: Secondary | ICD-10-CM

## 2017-11-23 LAB — COMPREHENSIVE METABOLIC PANEL
ALT: 23 U/L (ref 14–54)
AST: 28 U/L (ref 15–41)
Albumin: 4.1 g/dL (ref 3.5–5.0)
Alkaline Phosphatase: 99 U/L (ref 38–126)
Anion gap: 9 (ref 5–15)
BUN: 20 mg/dL (ref 6–20)
CO2: 23 mmol/L (ref 22–32)
Calcium: 9.2 mg/dL (ref 8.9–10.3)
Chloride: 108 mmol/L (ref 101–111)
Creatinine, Ser: 0.92 mg/dL (ref 0.44–1.00)
GFR calc Af Amer: >60 mL/min (ref >60)
GFR calc non Af Amer: >60 mL/min (ref >60)
Glucose, Bld: 91 mg/dL (ref 65–99)
Potassium: 4.3 mmol/L (ref 3.5–5.1)
Sodium: 140 mmol/L (ref 135–145)
Total Bilirubin: 0.5 mg/dL (ref 0.3–1.2)
Total Protein: 8.1 g/dL (ref 6.5–8.1)

## 2017-11-23 LAB — CBC WITH DIFFERENTIAL/PLATELET
Basophils Absolute: 0.1 10*3/uL (ref 0–0.1)
Basophils Relative: 1 %
Eosinophils Absolute: 0.3 10*3/uL (ref 0–0.7)
Eosinophils Relative: 3 %
HCT: 41.2 % (ref 35.0–47.0)
Hemoglobin: 14 g/dL (ref 12.0–16.0)
Lymphocytes Relative: 19 %
Lymphs Abs: 1.5 10*3/uL (ref 1.0–3.6)
MCH: 30.8 pg (ref 26.0–34.0)
MCHC: 34 g/dL (ref 32.0–36.0)
MCV: 90.6 fL (ref 80.0–100.0)
Monocytes Absolute: 0.5 10*3/uL (ref 0.2–0.9)
Monocytes Relative: 7 %
Neutro Abs: 5.9 10*3/uL (ref 1.4–6.5)
Neutrophils Relative %: 70 %
Platelets: 237 10*3/uL (ref 150–440)
RBC: 4.54 MIL/uL (ref 3.80–5.20)
RDW: 13.4 % (ref 11.5–14.5)
WBC: 8.3 10*3/uL (ref 3.6–11.0)

## 2017-11-23 LAB — TROPONIN I: Troponin I: <0.03 ng/mL (ref <0.03)

## 2017-11-23 LAB — BRAIN NATRIURETIC PEPTIDE: B Natriuretic Peptide: 16 pg/mL (ref 0.0–100.0)

## 2017-11-23 MED ORDER — IPRATROPIUM-ALBUTEROL 0.5-2.5 (3) MG/3ML IN SOLN
3.0000 mL | Freq: Once | RESPIRATORY_TRACT | Status: AC
  Administered 2017-11-23: 3 mL via RESPIRATORY_TRACT

## 2017-11-23 MED ORDER — METHYLPREDNISOLONE SODIUM SUCC 125 MG IJ SOLR
125.0000 mg | Freq: Once | INTRAMUSCULAR | Status: AC
  Administered 2017-11-23: 125 mg via INTRAVENOUS

## 2017-11-23 MED ORDER — BUDESONIDE 0.25 MG/2ML IN SUSP
0.2500 mg | Freq: Two times a day (BID) | RESPIRATORY_TRACT | Status: DC
  Administered 2017-11-23: 0.25 mg via RESPIRATORY_TRACT
  Filled 2017-11-23: qty 2

## 2017-11-23 MED ORDER — METHYLPREDNISOLONE SODIUM SUCC 125 MG IJ SOLR
60.0000 mg | Freq: Four times a day (QID) | INTRAMUSCULAR | Status: DC
  Administered 2017-11-24 – 2017-11-25 (×6): 60 mg via INTRAVENOUS
  Filled 2017-11-23 (×6): qty 2

## 2017-11-23 MED ORDER — TIOTROPIUM BROMIDE MONOHYDRATE 18 MCG IN CAPS
18.0000 ug | ORAL_CAPSULE | Freq: Every day | RESPIRATORY_TRACT | Status: DC
  Administered 2017-11-24: 18 ug via RESPIRATORY_TRACT
  Filled 2017-11-23: qty 5

## 2017-11-23 MED ORDER — MAGNESIUM SULFATE 2 GM/50ML IV SOLN
INTRAVENOUS | Status: AC
  Administered 2017-11-23: 2 g via INTRAVENOUS
  Filled 2017-11-23: qty 50

## 2017-11-23 MED ORDER — ACETAMINOPHEN 325 MG PO TABS
650.0000 mg | ORAL_TABLET | Freq: Four times a day (QID) | ORAL | Status: DC | PRN
  Administered 2017-11-24: 650 mg via ORAL
  Filled 2017-11-23: qty 2

## 2017-11-23 MED ORDER — PNEUMOCOCCAL VAC POLYVALENT 25 MCG/0.5ML IJ INJ: 0.5000 mL | INJECTION | INTRAMUSCULAR | Status: DC

## 2017-11-23 MED ORDER — GUAIFENESIN ER 600 MG PO TB12
600.0000 mg | ORAL_TABLET | Freq: Two times a day (BID) | ORAL | Status: DC
  Administered 2017-11-23: 600 mg via ORAL
  Filled 2017-11-23: qty 1

## 2017-11-23 MED ORDER — SODIUM CHLORIDE 0.9 % IV SOLN: 250.0000 mL | INTRAVENOUS | Status: DC | PRN

## 2017-11-23 MED ORDER — ENOXAPARIN SODIUM 40 MG/0.4ML ~~LOC~~ SOLN: 40.0000 mg | SUBCUTANEOUS | Status: DC

## 2017-11-23 MED ORDER — SODIUM CHLORIDE 0.9% FLUSH
3.0000 mL | Freq: Two times a day (BID) | INTRAVENOUS | Status: DC
  Administered 2017-11-24 – 2017-11-26 (×6): 3 mL via INTRAVENOUS

## 2017-11-23 MED ORDER — IPRATROPIUM-ALBUTEROL 0.5-2.5 (3) MG/3ML IN SOLN
3.0000 mL | RESPIRATORY_TRACT | Status: DC
  Administered 2017-11-23 – 2017-11-26 (×18): 3 mL via RESPIRATORY_TRACT
  Filled 2017-11-23 (×17): qty 3

## 2017-11-23 MED ORDER — ACETAMINOPHEN 650 MG RE SUPP: 650.0000 mg | Freq: Four times a day (QID) | RECTAL | Status: DC | PRN

## 2017-11-23 MED ORDER — SODIUM CHLORIDE 0.9% FLUSH: 3.0000 mL | INTRAVENOUS | Status: DC | PRN

## 2017-11-23 MED ORDER — ENOXAPARIN SODIUM 40 MG/0.4ML ~~LOC~~ SOLN
40.0000 mg | Freq: Once | SUBCUTANEOUS | Status: AC
  Administered 2017-11-23: 40 mg via SUBCUTANEOUS
  Filled 2017-11-23: qty 0.4

## 2017-11-23 MED ORDER — IPRATROPIUM-ALBUTEROL 0.5-2.5 (3) MG/3ML IN SOLN
RESPIRATORY_TRACT | Status: AC
  Filled 2017-11-23: qty 3

## 2017-11-23 MED ORDER — MAGNESIUM SULFATE 2 GM/50ML IV SOLN
2.0000 g | Freq: Once | INTRAVENOUS | Status: AC
  Administered 2017-11-23: 2 g via INTRAVENOUS

## 2017-11-23 MED ORDER — ONDANSETRON HCL 4 MG/2ML IJ SOLN: 4.0000 mg | Freq: Four times a day (QID) | INTRAMUSCULAR | Status: DC | PRN

## 2017-11-23 MED ORDER — ONDANSETRON HCL 4 MG PO TABS: 4.0000 mg | ORAL_TABLET | Freq: Four times a day (QID) | ORAL | Status: DC | PRN

## 2017-11-23 MED ORDER — IPRATROPIUM-ALBUTEROL 0.5-2.5 (3) MG/3ML IN SOLN
RESPIRATORY_TRACT | Status: AC
  Administered 2017-11-23: 3 mL via RESPIRATORY_TRACT
  Filled 2017-11-23: qty 9

## 2017-11-23 MED ORDER — BUDESONIDE 0.25 MG/2ML IN SUSP
RESPIRATORY_TRACT | Status: AC
  Administered 2017-11-23: 0.25 mg via RESPIRATORY_TRACT
  Filled 2017-11-23: qty 2

## 2017-11-23 MED ORDER — IPRATROPIUM-ALBUTEROL 0.5-2.5 (3) MG/3ML IN SOLN
RESPIRATORY_TRACT | Status: AC
  Filled 2017-11-23: qty 6

## 2017-11-23 MED ORDER — INFLUENZA VAC SPLIT QUAD 0.5 ML IM SUSY: 0.5000 mL | PREFILLED_SYRINGE | INTRAMUSCULAR | Status: DC

## 2017-11-23 MED ORDER — METHYLPREDNISOLONE SODIUM SUCC 125 MG IJ SOLR
INTRAMUSCULAR | Status: AC
  Administered 2017-11-23: 125 mg via INTRAVENOUS
  Filled 2017-11-23: qty 2

## 2017-11-23 NOTE — ED Provider Notes (Addendum)
[facility] Emergency Department Provider Note   ____________________________________________   First MD Initiated Contact with Patient 11/23/17 1920     (approximate)  I have reviewed the triage vital signs and the nursing notes.   HISTORY  Chief Complaint Shortness of Breath    HPI Elizabeth [name] is a 57 y.o. female Patient reports she stopped smoking recently but in the last day or so she's become very short of breath. She is a dry patchy cough and is wheezing. She feels very tight. When she comes in she is tripoding and breathing very rapidly.   Past Medical History:  Diagnosis Date  . Anxiety   . Arm pain 07/25/2015  . CHF (congestive heart failure) (HCC)   . Congestive heart failure (HCC) 07/25/2015  . COPD (chronic obstructive pulmonary disease) (HCC)    never been idagnosed  . Depression   . Lower extremity pain 07/25/2015  . Reflux   . RSD (reflex sympathetic dystrophy)   . Spinal cord stimulator status   . Vitamin B12 deficiency   . Vitamin D deficiency     Patient Active Problem List   Diagnosis Date Noted  . Disorder of bone, unspecified 08/30/2017  . Opioid-induced constipation (OIC) 09/15/2016  . Lumbar facet syndrome (Bilateral) 03/18/2016  . 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 Chronic upper extremity pain (Bilateral) 09/23/2015  . Chronic lower extremity pain (Bilateral) 09/23/2015  . Long term prescription opiate use 09/23/2015  . Encounter for chronic pain management 09/23/2015  . Elevated sedimentation rate 09/23/2015  . Chronic low back pain 07/25/2015  . Generalized anxiety disorder 07/25/2015  . Encounter for therapeutic drug level monitoring 07/25/2015  . Long term current use of opiate analgesic 07/25/2015  . Uncomplicated opioid dependence (HCC) 07/25/2015  . Opiate use (30 MME/Day) 07/25/2015  . Chronic pain syndrome 07/25/2015  . Complex regional pain syndrome I of upper limb (Bilateral)  07/25/2015  . Complex regional pain syndrome I of lower limb (Bilateral) 07/25/2015  . Opiate analgesic use agreement exists 07/25/2015  . Presence of spinal cord stimulator 07/25/2015  . Chronic obstructive pulmonary disease (COPD) (HCC) 07/25/2015  . Congestive heart failure (HCC) 07/25/2015  . Vitamin D deficiency 07/25/2015  . Vitamin B12 deficiency 07/25/2015    Past Surgical History:  Procedure Laterality Date  . SPINAL CORD STIMULATOR IMPLANT     x 2    Prior to Admission medications   Medication Sig Start Date End Date Taking? Authorizing Provider  albuterol (PROVENTIL HFA;VENTOLIN HFA) 108 (90 Base) MCG/ACT inhaler Inhale 2 puffs into the lungs every 6 (six) hours as needed for wheezing or shortness of breath. 10/18/17   Dionne BucySiadecki, Sebastian, MD  bisacodyl (DULCOLAX) 5 MG EC tablet Take 2 tablets (10 mg total) by mouth at bedtime as needed for moderate constipation ((Hold for loose stool)). Patient not taking: Reported on 11/23/2017 03/14/17   Barbette MerinoKing, Crystal M, NP  docusate sodium (COLACE) 100 MG capsule Take 2 capsules (200 mg total) by mouth at bedtime as needed for moderate constipation. Do not use longer than 7 days. Patient not taking: Reported on 11/23/2017 03/14/17   Barbette MerinoKing, Crystal M, NP  Melatonin 10 MG CAPS Take 20 mg by mouth at bedtime as needed. 06/12/17 11/16/17  Barbette MerinoKing, Crystal M, NP  oxyCODONE (OXY IR/ROXICODONE) 5 MG immediate release tablet Take 1 tablet (5 mg total) by mouth every 6 (six) hours as needed for severe pain. Patient not taking: Reported on 11/23/2017 02/07/18 03/09/18  Brooke DareKing,  Crystal M, NP  oxyCODONE (OXY IR/ROXICODONE) 5 MG immediate release tablet Take 1 tablet (5 mg total) by mouth every 6 (six) hours as needed for severe pain. Patient not taking: Reported on 11/23/2017 01/08/18 02/07/18  Barbette Merino, NP  oxyCODONE (OXY IR/ROXICODONE) 5 MG immediate release tablet Take 1 tablet (5 mg total) by mouth every 6 (six) hours as needed for severe pain. Patient not taking:  Reported on 11/23/2017 12/09/17 01/08/18  Barbette Merino, NP    Allergies Lisinopril  Family History  Problem Relation Age of Onset  . Cirrhosis Mother   . Cancer Father     Social History Social History   Tobacco Use  . Smoking status: Current Every Day Smoker    Packs/day: 0.50    Types: Cigarettes  . Smokeless tobacco: Never Used  . Tobacco comment: Quit 2 years ago  Substance Use Topics  . Alcohol use: No    Alcohol/week: 0.0 oz  . Drug use: No    Review of Systems  Constitutional: No fever/chills Eyes: No visual changes. ENT: No sore throat. Cardiovascular: Denies chest pain. Respiratory: shortness of breath. Gastrointestinal: No abdominal pain.  No nausea, no vomiting.  No diarrhea.  No constipation. Genitourinary: Negative for dysuria. Musculoskeletal: Negative for back pain. Skin: Negative for rash. Neurological: Negative for headaches, focal weakness  ____________________________________________   PHYSICAL EXAM:  VITAL SIGNS: ED Triage Vitals  Enc Vitals Group     BP 11/23/17 1858 (!) 137/98     Pulse Rate 11/23/17 1858 (!) 113     Resp 11/23/17 1858 (!) 32     Temp 11/23/17 1858 98.1 F (36.7 C)     Temp Source 11/23/17 1858 Oral     SpO2 11/23/17 1858 93 %     Weight 11/23/17 1859 120 lb (54.4 kg)     Height 11/23/17 1859 4\' 9"  (1.448 m)     Head Circumference --      Peak Flow --      Pain Score 11/23/17 1859 8     Pain Loc --      Pain Edu? --      Excl. in GC? --     Constitutional: Alert and oriented. in acute respiratory distress. Eyes: Conjunctivae are normal.  Head: Atraumatic. Nose: No congestion/rhinnorhea. Mouth/Throat: Mucous membranes are moist.  Oropharynx non-erythematous. Neck: No stridor.  Cardiovascular: Normal rate, regular rhythm. Grossly normal heart sounds.  Good peripheral circulation. Respiratory: increased respiratory effort.   retractionssensory muscle use. Lungs diffuse wheezes Gastrointestinal: Soft and  nontender. No distention. No abdominal bruits. No CVA tenderness. Musculoskeletal: No lower extremity tenderness nor edema.   Neurologic:  Normal speech and language. No gross focal neurologic deficits are appreciated. No gait instability. Skin:  Skin is warm, dry and intact. No rash noted. Psychiatric: Mood and affect are normal. Speech and behavior are normal.  ____________________________________________   LABS (all labs ordered are listed, but only abnormal results are displayed)  Labs Reviewed  CBC WITH DIFFERENTIAL/PLATELET  COMPREHENSIVE METABOLIC PANEL  BRAIN NATRIURETIC PEPTIDE  TROPONIN I   ____________________________________________  EKG  EKG read and interpreted by me shows sinus tachycardia rate of 124 normal axis no acute ST-T wave changes  Monitor shows sinus tachycardia at a rate very from 107 to 1:30. At present is 122 ____________________________________________  RADIOLOGY  ED MD interpretation:  chest x-ray shows no acute disease  Official radiology report(s): Dg Chest Portable 1 View  Result Date: 11/23/2017 CLINICAL DATA:  57 year old female  with shortness of breath. Initial encounter. EXAM: PORTABLE CHEST 1 VIEW COMPARISON:  10/18/2017 and 08/04/2017 chest x-ray. FINDINGS: Chronic lung changes. No infiltrate, congestive heart failure or pneumothorax. Right base subsegmental atelectasis. No plain film evidence of pulmonary malignancy. Heart size within normal limits. Spine stimulating device in place projecting over the lower cervical spine. IMPRESSION: Chronic lung changes. Right base subsegmental atelectasis. No infiltrate or congestive heart failure. Electronically Signed   By: Lacy Duverney M.D.   On: 11/23/2017 19:30    ____________________________________________   PROCEDURES  Procedure(s) performed:   Procedures  Critical Care performed:   ____________________________________________   INITIAL IMPRESSION / ASSESSMENT AND PLAN / ED  COURSE  patient comes into the emergency room is given 3 stacked DuoNeb's site Medrol and magnesium immediately. Patient improved slightly.  ----------------------------------------- 8:04 PM on 11/23/2017 -----------------------------------------  Patient's troponins negative CBC is normal she is not running a fever not aching all over she has had at this 0.125 site Medrol 2 g of magnesium and 5 DuoNeb nebs and she still wheezing and tachypnea. We will put her in the hospital.   Clinical Course as of Nov 23 2002  Tue Nov 23, 2017  1959 RBC: 4.54 [PM]    Clinical Course User Index [PM] Arnaldo Natal, MD     ____________________________________________   FINAL CLINICAL IMPRESSION(S) / ED DIAGNOSES  Final diagnoses:  COPD exacerbation [facility])     ED Discharge Orders    None       Note:  This document was prepared using Dragon voice recognition software and may include unintentional dictation errors.    Arnaldo Natal, MD 11/23/17 2004    Arnaldo Natal, MD 11/23/17 2010

## 2017-11-23 NOTE — H&P (Signed)
[employer] [facility] - [place] [facility]   PATIENT NAME: Elizabeth [name]    MR#:  [id number]  DATE OF BIRTH:  1961-02-19  DATE OF ADMISSION:  11/23/2017  PRIMARY CARE PHYSICIAN: [facility]   REQUESTING/REFERRING PHYSICIAN: Dorothea Glassman MD  CHIEF COMPLAINT:   Chief Complaint  Patient presents with  . Shortness of Breath    HISTORY OF PRESENT ILLNESS: Elizabeth [name]  is a 57 y.o. female with a known history of anxiety, states no diagnosis of COPD presenting with complaint of shortness of breath and chest tightness.  She states that the symptoms started 1 day ago.  She has been having a nonproductive cough and wheezing.  She denies any fevers or chills.  No joint aches or body aches  PAST MEDICAL HISTORY:   Past Medical History:  Diagnosis Date  . Anxiety   . Arm pain 07/25/2015  . CHF (congestive heart failure) (HCC)   . Congestive heart failure (HCC) 07/25/2015  . COPD (chronic obstructive pulmonary disease) (HCC)    never been idagnosed  . Depression   . Lower extremity pain 07/25/2015  . Reflux   . RSD (reflex sympathetic dystrophy)   . Spinal cord stimulator status   . Vitamin B12 deficiency   . Vitamin D deficiency     PAST SURGICAL HISTORY:  Past Surgical History:  Procedure Laterality Date  . SPINAL CORD STIMULATOR IMPLANT     x 2    SOCIAL HISTORY:  Social History   Tobacco Use  . Smoking status: Current Every Day Smoker    Packs/day: 0.50    Types: Cigarettes  . Smokeless tobacco: Never Used  . Tobacco comment: Quit 2 years ago  Substance Use Topics  . Alcohol use: No    Alcohol/week: 0.0 oz    FAMILY HISTORY:  Family History  Problem Relation Age of Onset  . Cirrhosis Mother   . Cancer Father     DRUG ALLERGIES:  Allergies  Allergen Reactions  . Lisinopril Anaphylaxis    REVIEW OF SYSTEMS:   CONSTITUTIONAL: No fever, fatigue or weakness.  EYES: No blurred or double vision.  EARS, NOSE, AND THROAT: No  tinnitus or ear pain.  RESPIRATORY: No cough, positive shortness of breath, positive wheezing or hemoptysis.  CARDIOVASCULAR: Positive chest pressure, orthopnea, edema.  GASTROINTESTINAL: No nausea, vomiting, diarrhea or abdominal pain.  GENITOURINARY: No dysuria, hematuria.  ENDOCRINE: No polyuria, nocturia,  HEMATOLOGY: No anemia, easy bruising or bleeding SKIN: No rash or lesion. MUSCULOSKELETAL: No joint pain or arthritis.   NEUROLOGIC: No tingling, numbness, weakness.  PSYCHIATRY: No anxiety or depression.   MEDICATIONS AT HOME:  Prior to Admission medications   Medication Sig Start Date End Date Taking? Authorizing Provider  albuterol (PROVENTIL HFA;VENTOLIN HFA) 108 (90 Base) MCG/ACT inhaler Inhale 2 puffs into the lungs every 6 (six) hours as needed for wheezing or shortness of breath. 10/18/17   Dionne Bucy, MD  bisacodyl (DULCOLAX) 5 MG EC tablet Take 2 tablets (10 mg total) by mouth at bedtime as needed for moderate constipation ((Hold for loose stool)). Patient not taking: Reported on 11/23/2017 03/14/17   Barbette Merino, NP  docusate sodium (COLACE) 100 MG capsule Take 2 capsules (200 mg total) by mouth at bedtime as needed for moderate constipation. Do not use longer than 7 days. Patient not taking: Reported on 11/23/2017 03/14/17   Barbette Merino, NP  Melatonin 10 MG CAPS Take 20 mg by mouth at bedtime as needed. 06/12/17 11/16/17  Barbette Merino, NP  oxyCODONE (OXY IR/ROXICODONE) 5 MG immediate release tablet Take 1 tablet (5 mg total) by mouth every 6 (six) hours as needed for severe pain. Patient not taking: Reported on 11/23/2017 02/07/18 03/09/18  Barbette Merino, NP  oxyCODONE (OXY IR/ROXICODONE) 5 MG immediate release tablet Take 1 tablet (5 mg total) by mouth every 6 (six) hours as needed for severe pain. Patient not taking: Reported on 11/23/2017 01/08/18 02/07/18  Barbette Merino, NP  oxyCODONE (OXY IR/ROXICODONE) 5 MG immediate release tablet Take 1 tablet (5 mg total) by  mouth every 6 (six) hours as needed for severe pain. Patient not taking: Reported on 11/23/2017 12/09/17 01/08/18  Barbette Merino, NP      PHYSICAL EXAMINATION:   VITAL SIGNS: Blood pressure 134/81, pulse (!) 134, temperature 98.1 F (36.7 C), temperature source Oral, resp. rate (!) 33, height 4\' 9"  (1.448 m), weight 120 lb (54.4 kg), SpO2 96 %.  GENERAL:  57 y.o.-year-old patient lying in the bed with no acute distress.  EYES: Pupils equal, round, reactive to light and accommodation. No scleral icterus. Extraocular muscles intact.  HEENT: Head atraumatic, normocephalic. Oropharynx and nasopharynx clear.  NECK:  Supple, no jugular venous distention. No thyroid enlargement, no tenderness.  LUNGS: Bilateral wheezing throughout both lungs and accessory muscle usage CARDIOVASCULAR: S1, S2 normal. No murmurs, rubs, or gallops.  ABDOMEN: Soft, nontender, nondistended. Bowel sounds present. No organomegaly or mass.  EXTREMITIES: No pedal edema, cyanosis, or clubbing.  NEUROLOGIC: Cranial nerves II through XII are intact. Muscle strength 5/5 in all extremities. Sensation intact. Gait not checked.  PSYCHIATRIC: The patient is alert and oriented x 3.  SKIN: No obvious rash, lesion, or ulcer.   LABORATORY PANEL:   CBC Recent Labs  Lab 11/23/17 1900  WBC 8.3  HGB 14.0  HCT 41.2  PLT 237  MCV 90.6  MCH 30.8  MCHC 34.0  RDW 13.4  LYMPHSABS 1.5  MONOABS 0.5  EOSABS 0.3  BASOSABS 0.1   ------------------------------------------------------------------------------------------------------------------  Chemistries  Recent Labs  Lab 11/23/17 1900  NA 140  K 4.3  CL 108  CO2 23  GLUCOSE 91  BUN 20  CREATININE 0.92  CALCIUM 9.2  AST 28  ALT 23  ALKPHOS 99  BILITOT PENDING   ------------------------------------------------------------------------------------------------------------------ estimated creatinine clearance is 47.8 mL/min (by C-G formula based on SCr of 0.92  mg/dL). ------------------------------------------------------------------------------------------------------------------ No results for input(s): TSH, T4TOTAL, T3FREE, THYROIDAB in the last 72 hours.  Invalid input(s): FREET3   Coagulation profile No results for input(s): INR, PROTIME in the last 168 hours. ------------------------------------------------------------------------------------------------------------------- No results for input(s): DDIMER in the last 72 hours. -------------------------------------------------------------------------------------------------------------------  Cardiac Enzymes Recent Labs  Lab 11/23/17 1900  TROPONINI <0.03   ------------------------------------------------------------------------------------------------------------------ Invalid input(s): POCBNP  ---------------------------------------------------------------------------------------------------------------  Urinalysis No results found for: COLORURINE, APPEARANCEUR, LABSPEC, PHURINE, GLUCOSEU, HGBUR, BILIRUBINUR, KETONESUR, PROTEINUR, UROBILINOGEN, NITRITE, LEUKOCYTESUR   RADIOLOGY: Dg Chest Portable 1 View  Result Date: 11/23/2017 CLINICAL DATA:  57 year old female with shortness of breath. Initial encounter. EXAM: PORTABLE CHEST 1 VIEW COMPARISON:  10/18/2017 and 08/04/2017 chest x-ray. FINDINGS: Chronic lung changes. No infiltrate, congestive heart failure or pneumothorax. Right base subsegmental atelectasis. No plain film evidence of pulmonary malignancy. Heart size within normal limits. Spine stimulating device in place projecting over the lower cervical spine. IMPRESSION: Chronic lung changes. Right base subsegmental atelectasis. No infiltrate or congestive heart failure. Electronically Signed   By: Lacy Duverney M.D.   On: 11/23/2017 19:30    EKG: Orders placed  or performed during the hospital encounter of 11/23/17  . ED EKG  . ED EKG  . EKG 12-Lead  . EKG 12-Lead  . EKG  12-Lead  . EKG 12-Lead  . EKG 12-Lead  . EKG 12-Lead    IMPRESSION AND PLAN: 57 year old white female with history of nicotine addiction presenting with shortness of breath  1.  Acute COPD exasperation  -patient denies history of COPD I will treat with nebulizer therapy, IV Solu-Medrol, Pulmicort Mucinex Oxygen therapy Patient will need outpatient pulmonary follow-up with PFTs when she is improved  2.  Chest tightness suspect due to COPD exasperation follow cardiac enzymes if symptoms persist may need further cardiac evaluation  3.  Acute bronchitis treat with oral antibiotics  4.  Miscellaneous Lovenox for DVT prophylaxis   All the records are reviewed and case discussed with ED provider. Management plans discussed with the patient, family and they are in agreement.  CODE STATUS:    Code Status Orders  (From admission, onward)        Start     Ordered   11/23/17 2047  Full code  Continuous     11/23/17 2050    Code Status History    Date Active Date Inactive Code Status Order ID Comments User Context   This patient has a current code status but no historical code status.       TOTAL TIME TAKING CARE OF THIS PATIENT: 55 minutes.    Auburn BilberryShreyang Tyger Oka M.D on 11/23/2017 at 8:52 PM  Between 7am to 6pm - Pager - [phone]  After 6pm go to [email or web address] - password [email or web address] [facility]  [facility]  Office  [phone]  CC: Primary care physician; [facility]

## 2017-11-23 NOTE — ED Notes (Signed)
MD at bedside. 

## 2017-11-23 NOTE — ED Notes (Signed)
Patient transported to room 148 by this EDT.

## 2017-11-24 LAB — CBC
HEMATOCRIT: 36.8 % (ref 35.0–47.0)
HEMOGLOBIN: 12.3 g/dL (ref 12.0–16.0)
MCH: 30.5 pg (ref 26.0–34.0)
MCHC: 33.5 g/dL (ref 32.0–36.0)
MCV: 91 fL (ref 80.0–100.0)
Platelets: 216 10*3/uL (ref 150–440)
RBC: 4.04 MIL/uL (ref 3.80–5.20)
RDW: 13.2 % (ref 11.5–14.5)
WBC: 5.8 10*3/uL (ref 3.6–11.0)

## 2017-11-24 LAB — TROPONIN I

## 2017-11-24 LAB — BASIC METABOLIC PANEL
ANION GAP: 14 (ref 5–15)
BUN: 19 mg/dL (ref 6–20)
CALCIUM: 8.9 mg/dL (ref 8.9–10.3)
CO2: 20 mmol/L — AB (ref 22–32)
CREATININE: 0.99 mg/dL (ref 0.44–1.00)
Chloride: 105 mmol/L (ref 101–111)
GFR calc non Af Amer: >60 mL/min (ref >60)
Glucose, Bld: 153 mg/dL — ABNORMAL HIGH (ref 65–99)
Potassium: 3.4 mmol/L — ABNORMAL LOW (ref 3.5–5.1)
SODIUM: 139 mmol/L (ref 135–145)

## 2017-11-24 MED ORDER — IPRATROPIUM-ALBUTEROL 0.5-2.5 (3) MG/3ML IN SOLN
3.0000 mL | Freq: Once | RESPIRATORY_TRACT | Status: AC
  Administered 2017-11-24: 3 mL via RESPIRATORY_TRACT

## 2017-11-24 MED ORDER — POTASSIUM CHLORIDE CRYS ER 20 MEQ PO TBCR
40.0000 meq | EXTENDED_RELEASE_TABLET | Freq: Once | ORAL | Status: AC
  Administered 2017-11-24: 40 meq via ORAL
  Filled 2017-11-24: qty 2

## 2017-11-24 MED ORDER — GUAIFENESIN-DM 100-10 MG/5ML PO SYRP
5.0000 mL | ORAL_SOLUTION | ORAL | Status: DC | PRN
  Administered 2017-11-24 – 2017-11-25 (×3): 5 mL via ORAL
  Filled 2017-11-24 (×2): qty 5

## 2017-11-24 MED ORDER — MOMETASONE FURO-FORMOTEROL FUM 200-5 MCG/ACT IN AERO
2.0000 | INHALATION_SPRAY | Freq: Two times a day (BID) | RESPIRATORY_TRACT | Status: DC
  Administered 2017-11-24 – 2017-11-26 (×4): 2 via RESPIRATORY_TRACT
  Filled 2017-11-24: qty 8.8

## 2017-11-24 MED ORDER — OXYCODONE HCL 5 MG PO TABS
5.0000 mg | ORAL_TABLET | Freq: Four times a day (QID) | ORAL | Status: DC | PRN
  Administered 2017-11-24 (×2): 5 mg via ORAL
  Filled 2017-11-24 (×2): qty 1

## 2017-11-24 MED ORDER — OXYCODONE HCL 5 MG PO TABS
5.0000 mg | ORAL_TABLET | Freq: Four times a day (QID) | ORAL | Status: DC | PRN
  Administered 2017-11-24 – 2017-11-26 (×7): 5 mg via ORAL
  Filled 2017-11-24 (×7): qty 1

## 2017-11-24 MED ORDER — BENZONATATE 100 MG PO CAPS
200.0000 mg | ORAL_CAPSULE | Freq: Three times a day (TID) | ORAL | Status: DC
  Administered 2017-11-24 – 2017-11-26 (×6): 200 mg via ORAL
  Filled 2017-11-24 (×6): qty 2

## 2017-11-24 MED ORDER — ATORVASTATIN CALCIUM 20 MG PO TABS
40.0000 mg | ORAL_TABLET | Freq: Every day | ORAL | Status: DC
  Administered 2017-11-24 – 2017-11-26 (×3): 40 mg via ORAL
  Filled 2017-11-24 (×3): qty 2

## 2017-11-24 MED ORDER — BENZONATATE 100 MG PO CAPS
200.0000 mg | ORAL_CAPSULE | Freq: Three times a day (TID) | ORAL | Status: DC | PRN
  Administered 2017-11-24 (×2): 200 mg via ORAL
  Filled 2017-11-24 (×2): qty 2

## 2017-11-24 MED ORDER — AZITHROMYCIN 500 MG PO TABS
500.0000 mg | ORAL_TABLET | Freq: Every day | ORAL | Status: AC
  Administered 2017-11-24: 500 mg via ORAL
  Filled 2017-11-24: qty 1

## 2017-11-24 MED ORDER — AZITHROMYCIN 250 MG PO TABS
250.0000 mg | ORAL_TABLET | Freq: Every day | ORAL | Status: DC
  Administered 2017-11-25 – 2017-11-26 (×2): 250 mg via ORAL
  Filled 2017-11-24 (×2): qty 1

## 2017-11-24 MED ORDER — DOCUSATE SODIUM 100 MG PO CAPS
100.0000 mg | ORAL_CAPSULE | Freq: Two times a day (BID) | ORAL | Status: DC
  Administered 2017-11-24 – 2017-11-26 (×4): 100 mg via ORAL
  Filled 2017-11-24 (×4): qty 1

## 2017-11-24 MED ORDER — NICOTINE 21 MG/24HR TD PT24
21.0000 mg | MEDICATED_PATCH | Freq: Every day | TRANSDERMAL | Status: DC
  Administered 2017-11-24 – 2017-11-26 (×3): 21 mg via TRANSDERMAL
  Filled 2017-11-24 (×3): qty 1

## 2017-11-24 MED ORDER — HYDROCOD POLST-CPM POLST ER 10-8 MG/5ML PO SUER
5.0000 mL | Freq: Two times a day (BID) | ORAL | Status: DC
  Administered 2017-11-24 – 2017-11-26 (×6): 5 mL via ORAL
  Filled 2017-11-24 (×6): qty 5

## 2017-11-24 MED ORDER — PANTOPRAZOLE SODIUM 40 MG PO TBEC
40.0000 mg | DELAYED_RELEASE_TABLET | Freq: Two times a day (BID) | ORAL | Status: DC
  Administered 2017-11-24 – 2017-11-26 (×4): 40 mg via ORAL
  Filled 2017-11-24 (×4): qty 1

## 2017-11-24 NOTE — Progress Notes (Signed)
[facility] - [place] at [facility]   PATIENT NAME: Shikita Vaillancourt    MR#:  [id number]  DATE OF BIRTH:  [birth year]  SUBJECTIVE:  CHIEF COMPLAINT:   Chief Complaint  Patient presents with  . Shortness of Breath   -Came in with COPD exacerbation. Complains of chest tightness and dyspnea. -On 2 L oxygen which is acute. -Also complains of back pain  REVIEW OF SYSTEMS:  Review of Systems  Constitutional: Positive for malaise/fatigue. Negative for chills and fever.  HENT: Negative for congestion, ear discharge, hearing loss and nosebleeds.   Eyes: Negative for blurred vision and double vision.  Respiratory: Positive for cough and shortness of breath. Negative for wheezing.   Cardiovascular: Negative for chest pain and palpitations.  Gastrointestinal: Negative for abdominal pain, constipation, diarrhea, nausea and vomiting.  Genitourinary: Negative for dysuria.  Musculoskeletal: Positive for back pain.  Neurological: Negative for dizziness, sensory change, speech change, focal weakness, seizures and headaches.  Psychiatric/Behavioral: Negative for depression.    DRUG ALLERGIES:   Allergies  Allergen Reactions  . Lisinopril Anaphylaxis    VITALS:  Blood pressure 122/61, pulse 89, temperature 98 F (36.7 C), temperature source Oral, resp. rate 20, height 4\' 9"  (1.448 m), weight 61.3 kg (135 lb 1.6 oz), SpO2 93 %.  PHYSICAL EXAMINATION:  Physical Exam  GENERAL:  57 y.o.-year-old patient lying in the bed with no acute distress.  EYES: Pupils equal, round, reactive to light and accommodation. No scleral icterus. Extraocular muscles intact.  HEENT: Head atraumatic, normocephalic. Oropharynx and nasopharynx clear.  NECK:  Supple, no jugular venous distention. No thyroid enlargement, no tenderness.  LUNGS: Scant breath sounds on auscultation bilaterally, upper airway wheeze noted. No rales, rhonchi or crepitation. No use of accessory muscles of respiration.    CARDIOVASCULAR: S1, S2 normal. No murmurs, rubs, or gallops.  ABDOMEN: Soft, nontender, nondistended. Bowel sounds present. No organomegaly or mass.  EXTREMITIES: No pedal edema, cyanosis, or clubbing.  NEUROLOGIC: Cranial nerves II through XII are intact. Muscle strength 5/5 in all extremities. Sensation intact. Gait not checked.  PSYCHIATRIC: The patient is alert and oriented x 3.  SKIN: No obvious rash, lesion, or ulcer.    LABORATORY PANEL:   CBC Recent Labs  Lab 11/24/17 0236  WBC 5.8  HGB 12.3  HCT 36.8  PLT 216   ------------------------------------------------------------------------------------------------------------------  Chemistries  Recent Labs  Lab 11/23/17 1900 11/24/17 0236  NA 140 139  K 4.3 3.4*  CL 108 105  CO2 23 20*  GLUCOSE 91 153*  BUN 20 19  CREATININE 0.92 0.99  CALCIUM 9.2 8.9  AST 28  --   ALT 23  --   ALKPHOS 99  --   BILITOT 0.5  --    ------------------------------------------------------------------------------------------------------------------  Cardiac Enzymes Recent Labs  Lab 11/24/17 0236  TROPONINI <0.03   ------------------------------------------------------------------------------------------------------------------  RADIOLOGY:  Dg Chest Portable 1 View  Result Date: 11/23/2017 CLINICAL DATA:  57 year old female with shortness of breath. Initial encounter. EXAM: PORTABLE CHEST 1 VIEW COMPARISON:  10/18/2017 and 08/04/2017 chest x-ray. FINDINGS: Chronic lung changes. No infiltrate, congestive heart failure or pneumothorax. Right base subsegmental atelectasis. No plain film evidence of pulmonary malignancy. Heart size within normal limits. Spine stimulating device in place projecting over the lower cervical spine. IMPRESSION: Chronic lung changes. Right base subsegmental atelectasis. No infiltrate or congestive heart failure. Electronically Signed   By: Lacy Duverney M.D.   On: 11/23/2017 19:30    EKG:   Orders placed  or performed  during the hospital encounter of 11/23/17  . ED EKG  . ED EKG  . EKG 12-Lead  . EKG 12-Lead  . EKG 12-Lead  . EKG 12-Lead  . EKG 12-Lead  . EKG 12-Lead    ASSESSMENT AND PLAN:   57 year old female with past medical history significant for COPD, ongoing smoking, depression and anxiety, GERD presents to hospital secondary to worsening shortness of breath.  1. Acute on chronic COPD exacerbation- continue IV steroids high-dose today. -Continue nebulizer treatments. -On Z-Pak for bronchitis symptoms. -Chest x-ray is clear. -Continue supplemental oxygen for now. -Clock medications added.  2. GERD-started on Protonix twice a day  3. Tobacco use disorder-counseled against smoking. We'll start nicotine patch.  4. Hypokalemia-being replaced  5. DVT prophylaxis-Lovenox    All the records are reviewed and case discussed with Care Management/Social Workerr. Management plans discussed with the patient, family and they are in agreement.  CODE STATUS:  Full Code  TOTAL TIME TAKING CARE OF THIS PATIENT: 38 minutes.   POSSIBLE D/C IN 1-2 DAYS, DEPENDING ON CLINICAL CONDITION.   Enid BaasKALISETTI,Evelia Waskey M.D on 11/24/2017 at 2:19 PM  Between 7am to 6pm - Pager - [phone]  After 6pm go to [email or web address] - Social research officer, governmentpassword EPAS [facility]  [facility] [place] [facility]  Office  [phone]  CC: Primary care physician; Center, [facility]

## 2017-11-25 MED ORDER — METHYLPREDNISOLONE SODIUM SUCC 125 MG IJ SOLR
60.0000 mg | Freq: Two times a day (BID) | INTRAMUSCULAR | Status: DC
  Administered 2017-11-25 – 2017-11-26 (×2): 60 mg via INTRAVENOUS
  Filled 2017-11-25 (×2): qty 2

## 2017-11-25 MED ORDER — POLYETHYLENE GLYCOL 3350 17 G PO PACK
17.0000 g | PACK | Freq: Every day | ORAL | Status: DC
  Administered 2017-11-25 – 2017-11-26 (×2): 17 g via ORAL
  Filled 2017-11-25 (×2): qty 1

## 2017-11-25 MED ORDER — BISACODYL 10 MG RE SUPP
10.0000 mg | Freq: Every day | RECTAL | Status: DC | PRN
  Administered 2017-11-26: 10 mg via RECTAL
  Filled 2017-11-25: qty 1

## 2017-11-25 MED ORDER — MELATONIN 5 MG PO TABS
2.5000 mg | ORAL_TABLET | Freq: Every day | ORAL | Status: DC
  Administered 2017-11-25: 2.5 mg via ORAL
  Filled 2017-11-25 (×2): qty 0.5

## 2017-11-25 NOTE — Progress Notes (Signed)
[facility] - [place] at [facility]   PATIENT NAME: Elizabeth [name]    MR#:  [id number]  DATE OF BIRTH:  1961-08-09  SUBJECTIVE:  CHIEF COMPLAINT:   Chief Complaint  Patient presents with  . Shortness of Breath   -Still pretty tight on auscultation, very minimal improvement in breathing. Also has cough  REVIEW OF SYSTEMS:  Review of Systems  Constitutional: Positive for malaise/fatigue. Negative for chills and fever.  HENT: Negative for congestion, ear discharge, hearing loss and nosebleeds.   Eyes: Negative for blurred vision and double vision.  Respiratory: Positive for cough and shortness of breath. Negative for wheezing.   Cardiovascular: Negative for chest pain and palpitations.  Gastrointestinal: Negative for abdominal pain, constipation, diarrhea, nausea and vomiting.  Genitourinary: Negative for dysuria.  Musculoskeletal: Positive for back pain.  Neurological: Negative for dizziness, sensory change, speech change, focal weakness, seizures and headaches.  Psychiatric/Behavioral: Negative for depression.    DRUG ALLERGIES:   Allergies  Allergen Reactions  . Lisinopril Anaphylaxis    VITALS:  Blood pressure 122/66, pulse (!) 102, temperature 98 F (36.7 C), temperature source Oral, resp. rate 18, height 4\' 9"  (1.448 m), weight 61.3 kg (135 lb 1.6 oz), SpO2 93 %.  PHYSICAL EXAMINATION:  Physical Exam  GENERAL:  57 y.o.-year-old patient lying in the bed with no acute distress.  EYES: Pupils equal, round, reactive to light and accommodation. No scleral icterus. Extraocular muscles intact.  HEENT: Head atraumatic, normocephalic. Oropharynx and nasopharynx clear.  NECK:  Supple, no jugular venous distention. No thyroid enlargement, no tenderness.  LUNGS: Scant breath sounds on auscultation bilaterally, upper airway wheeze noted. No rales, rhonchi or crepitation. No use of accessory muscles of respiration.  CARDIOVASCULAR: S1, S2 normal. No murmurs,  rubs, or gallops.  ABDOMEN: Soft, nontender, nondistended. Bowel sounds present. No organomegaly or mass.  EXTREMITIES: No pedal edema, cyanosis, or clubbing.  NEUROLOGIC: Cranial nerves II through XII are intact. Muscle strength 5/5 in all extremities. Sensation intact. Gait not checked.  PSYCHIATRIC: The patient is alert and oriented x 3.  SKIN: No obvious rash, lesion, or ulcer.    LABORATORY PANEL:   CBC Recent Labs  Lab 11/24/17 0236  WBC 5.8  HGB 12.3  HCT 36.8  PLT 216   ------------------------------------------------------------------------------------------------------------------  Chemistries  Recent Labs  Lab 11/23/17 1900 11/24/17 0236  NA 140 139  K 4.3 3.4*  CL 108 105  CO2 23 20*  GLUCOSE 91 153*  BUN 20 19  CREATININE 0.92 0.99  CALCIUM 9.2 8.9  AST 28  --   ALT 23  --   ALKPHOS 99  --   BILITOT 0.5  --    ------------------------------------------------------------------------------------------------------------------  Cardiac Enzymes Recent Labs  Lab 11/24/17 0236  TROPONINI <0.03   ------------------------------------------------------------------------------------------------------------------  RADIOLOGY:  Dg Chest Portable 1 View  Result Date: 11/23/2017 CLINICAL DATA:  57 year old female with shortness of breath. Initial encounter. EXAM: PORTABLE CHEST 1 VIEW COMPARISON:  10/18/2017 and 08/04/2017 chest x-ray. FINDINGS: Chronic lung changes. No infiltrate, congestive heart failure or pneumothorax. Right base subsegmental atelectasis. No plain film evidence of pulmonary malignancy. Heart size within normal limits. Spine stimulating device in place projecting over the lower cervical spine. IMPRESSION: Chronic lung changes. Right base subsegmental atelectasis. No infiltrate or congestive heart failure. Electronically Signed   By: Lacy Duverney M.D.   On: 11/23/2017 19:30    EKG:   Orders placed or performed during the hospital encounter of  11/23/17  . ED EKG  .  ED EKG  . EKG 12-Lead  . EKG 12-Lead  . EKG 12-Lead  . EKG 12-Lead  . EKG 12-Lead  . EKG 12-Lead    ASSESSMENT AND PLAN:   57 year old female with past medical history significant for COPD, ongoing smoking, depression and anxiety, GERD presents to hospital secondary to worsening shortness of breath.  1. Acute on chronic COPD exacerbation- continue IV steroids high-dose -decreased the frequency -Continue nebulizer treatments. -On Z-Pak for bronchitis symptoms. -Chest x-ray is clear. -Continue supplemental oxygen for now. On 2 L currently -Cough medications added.  2. GERD-on Protonix twice a day  3. Tobacco use disorder-counseled against smoking. on nicotine patch.  4. Hypokalemia-replaced  5. DVT prophylaxis-Lovenox  6. Constipation-medications added  Continue to wean oxygen. Encourage ambulation    All the records are reviewed and case discussed with Care Management/Social Workerr. Management plans discussed with the patient, family and they are in agreement.  CODE STATUS:  Full Code  TOTAL TIME TAKING CARE OF THIS PATIENT: 38 minutes.   POSSIBLE D/C IN 1-2 DAYS, DEPENDING ON CLINICAL CONDITION.   Enid BaasKALISETTI,Amya Hlad M.D on 11/25/2017 at 1:32 PM  Between 7am to 6pm - Pager - [phone]  After 6pm go to [email or web address] - Social research officer, governmentpassword EPAS [facility]  [facility] [place] [facility]  Office  [phone]  CC: Primary care physician; [facility], [facility]

## 2017-11-25 NOTE — Progress Notes (Signed)
Pt states she has not slept in 3 days. She says she can't fall asleep. She also states she has not had a bowel movement in 2 weeks

## 2017-11-26 ENCOUNTER — Inpatient Hospital Stay: Payer: Medicaid Other

## 2017-11-26 MED ORDER — FLEET ENEMA 7-19 GM/118ML RE ENEM
1.0000 | ENEMA | Freq: Once | RECTAL | Status: AC
  Administered 2017-11-26: 1 via RECTAL

## 2017-11-26 MED ORDER — METHYLNALTREXONE BROMIDE 12 MG/0.6ML ~~LOC~~ SOLN
8.0000 mg | Freq: Once | SUBCUTANEOUS | Status: DC
  Filled 2017-11-26: qty 0.6

## 2017-11-26 MED ORDER — ALBUTEROL SULFATE HFA 108 (90 BASE) MCG/ACT IN AERS: 2.0000 | INHALATION_SPRAY | Freq: Four times a day (QID) | RESPIRATORY_TRACT | 2 refills | Status: AC | PRN

## 2017-11-26 MED ORDER — MAGNESIUM CITRATE PO SOLN
1.0000 | Freq: Once | ORAL | Status: AC
  Administered 2017-11-26: 1 via ORAL
  Filled 2017-11-26: qty 296

## 2017-11-26 MED ORDER — POLYETHYLENE GLYCOL 3350 17 G PO PACK: 17.0000 g | PACK | Freq: Every day | ORAL | 0 refills | Status: DC

## 2017-11-26 MED ORDER — IPRATROPIUM-ALBUTEROL 0.5-2.5 (3) MG/3ML IN SOLN: 3.0000 mL | Freq: Four times a day (QID) | RESPIRATORY_TRACT | 0 refills | Status: DC | PRN

## 2017-11-26 MED ORDER — BENZONATATE 200 MG PO CAPS: 200.0000 mg | ORAL_CAPSULE | Freq: Three times a day (TID) | ORAL | 0 refills | Status: DC

## 2017-11-26 MED ORDER — HYDROCOD POLST-CPM POLST ER 10-8 MG/5ML PO SUER: 5.0000 mL | Freq: Two times a day (BID) | ORAL | 0 refills | Status: DC

## 2017-11-26 MED ORDER — DOCUSATE SODIUM 100 MG PO CAPS: 100.0000 mg | ORAL_CAPSULE | Freq: Two times a day (BID) | ORAL | 0 refills | Status: DC

## 2017-11-26 MED ORDER — AZITHROMYCIN 250 MG PO TABS: 250.0000 mg | ORAL_TABLET | Freq: Every day | ORAL | 0 refills | Status: DC

## 2017-11-26 MED ORDER — PREDNISONE 10 MG PO TABS: ORAL_TABLET | ORAL | 0 refills | Status: DC

## 2017-11-26 MED ORDER — FLUTICASONE-SALMETEROL 250-50 MCG/DOSE IN AEPB: 1.0000 | INHALATION_SPRAY | Freq: Two times a day (BID) | RESPIRATORY_TRACT | 2 refills | Status: DC

## 2017-11-26 NOTE — Discharge Summary (Signed)
[facility] - [place] at [facility]   PATIENT NAME: Elizabeth [name]    MR#:  [id number]  DATE OF BIRTH:  1961/07/31  DATE OF ADMISSION:  11/23/2017   ADMITTING PHYSICIAN: Auburn Bilberry, MD  DATE OF DISCHARGE: 11/26/17  PRIMARY CARE PHYSICIAN: [facility], [facility]   ADMISSION DIAGNOSIS:   COPD exacerbation (HCC) [J44.1]  DISCHARGE DIAGNOSIS:   Active Problems:   COPD with acute bronchitis (HCC)   SECONDARY DIAGNOSIS:   Past Medical History:  Diagnosis Date  . Anxiety   . Arm pain 07/25/2015  . CHF (congestive heart failure) (HCC)   . Congestive heart failure (HCC) 07/25/2015  . COPD (chronic obstructive pulmonary disease) (HCC)    never been idagnosed  . Depression   . Lower extremity pain 07/25/2015  . Reflux   . RSD (reflex sympathetic dystrophy)   . Spinal cord stimulator status   . Vitamin B12 deficiency   . Vitamin D deficiency     HOSPITAL COURSE:   57 year old female with past medical history significant for COPD, ongoing smoking, depression and anxiety, GERD presents to hospital secondary to worsening shortness of breath.  1. Acute on chronic COPD exacerbation- was requiring 2 L oxygen on admission. Just weaned off the oxygen and saturations have improved. -Received IV steroids and now being discharged on a 12 day prednisone taper Exline-advised to stay away from smoking. Continue Z-Pak for bronchitis symptoms. -Restarted on inhalers and nebulizers at discharge as well -Cough medications added.  2. GERD-on PPI twice a day  3. Tobacco use disorder- counseled against smoking. .  4. Hypokalemia-replaced  5. Constipation-medications added  Clinically improved. Will be discharged today   DISCHARGE CONDITIONS:   Guarded  CONSULTS OBTAINED:   None  DRUG ALLERGIES:   Allergies  Allergen Reactions  . Lisinopril Anaphylaxis   DISCHARGE MEDICATIONS:   Allergies as of 11/26/2017      Reactions   Lisinopril  Anaphylaxis      Medication List    TAKE these medications   albuterol 108 (90 Base) MCG/ACT inhaler Commonly known as:  PROVENTIL HFA;VENTOLIN HFA Inhale 2 puffs into the lungs every 6 (six) hours as needed for wheezing or shortness of breath.   atorvastatin 40 MG tablet Commonly known as:  LIPITOR Take 40 mg by mouth daily.   azithromycin 250 MG tablet Commonly known as:  ZITHROMAX Take 1 tablet (250 mg total) by mouth daily. X 3 more days   benzonatate 200 MG capsule Commonly known as:  TESSALON Take 1 capsule (200 mg total) by mouth 3 (three) times daily.   bisacodyl 5 MG EC tablet Commonly known as:  DULCOLAX Take 2 tablets (10 mg total) by mouth at bedtime as needed for moderate constipation ((Hold for loose stool)).   chlorpheniramine-HYDROcodone 10-8 MG/5ML Suer Commonly known as:  TUSSIONEX Take 5 mLs by mouth every 12 (twelve) hours.   docusate sodium 100 MG capsule Commonly known as:  COLACE Take 1 capsule (100 mg total) by mouth 2 (two) times daily. Hold if diarrhea What changed:    how much to take  when to take this  reasons to take this  additional instructions   Fluticasone-Salmeterol 250-50 MCG/DOSE Aepb Commonly known as:  ADVAIR DISKUS Inhale 1 puff into the lungs 2 (two) times daily.   furosemide 20 MG tablet Commonly known as:  LASIX Take 20 mg by mouth.   ipratropium-albuterol 0.5-2.5 (3) MG/3ML Soln Commonly known as:  DUONEB Take 3 mLs by nebulization every  6 (six) hours as needed (wheezing).   Melatonin 1 MG Tabs Take 1-5 mg by mouth at bedtime as needed.   omeprazole 20 MG capsule Commonly known as:  PRILOSEC Take 20 mg by mouth 2 (two) times daily.   oxyCODONE 5 MG immediate release tablet Commonly known as:  Oxy IR/ROXICODONE Take 1 tablet (5 mg total) by mouth every 6 (six) hours as needed for severe pain. Start taking on:  12/09/2017 What changed:  Another medication with the same name was removed. Continue taking this  medication, and follow the directions you see here.   polyethylene glycol packet Commonly known as:  MIRALAX / GLYCOLAX Take 17 g by mouth daily. Start taking on:  11/27/2017   potassium chloride SA 20 MEQ tablet Commonly known as:  K-DUR,KLOR-CON Take 20 mEq by mouth daily.   predniSONE 10 MG tablet Commonly known as:  DELTASONE 6 tabs PO x 2 days 5 tabs PO x 2 days 4 tabs PO x 2 days 3 tabs PO x 2 days 2 tabs PO x 2 days 1 tab PO x 2 days and stop        DISCHARGE INSTRUCTIONS:   1. PCP f/u in 1-2 weeks  DIET:   Cardiac diet  ACTIVITY:   Activity as tolerated  OXYGEN:   Home Oxygen: No.  Oxygen Delivery: room air  DISCHARGE LOCATION:   home   If you experience worsening of your admission symptoms, develop shortness of breath, life threatening emergency, suicidal or homicidal thoughts you must seek medical attention immediately by calling 911 or calling your MD immediately  if symptoms less severe.  You Must read complete instructions/literature along with all the possible adverse reactions/side effects for all the Medicines you take and that have been prescribed to you. Take any new Medicines after you have completely understood and accpet all the possible adverse reactions/side effects.   Please note  You were cared for by a hospitalist during your hospital stay. If you have any questions about your discharge medications or the care you received while you were in the hospital after you are discharged, you can call the unit and asked to speak with the hospitalist on call if the hospitalist that took care of you is not available. Once you are discharged, your primary care physician will handle any further medical issues. Please note that NO REFILLS for any discharge medications will be authorized once you are discharged, as it is imperative that you return to your primary care physician (or establish a relationship with a primary care physician if you do not have  one) for your aftercare needs so that they can reassess your need for medications and monitor your lab values.    On the day of Discharge:  VITAL SIGNS:   Blood pressure (!) 111/54, pulse 85, temperature 98.5 F (36.9 C), temperature source Oral, resp. rate 16, height 4\' 9"  (1.448 m), weight 61.3 kg (135 lb 1.6 oz), SpO2 96 %.  PHYSICAL EXAMINATION:     GENERAL:  57 y.o.-year-old patient lying in the bed with no acute distress.  EYES: Pupils equal, round, reactive to light and accommodation. No scleral icterus. Extraocular muscles intact.  HEENT: Head atraumatic, normocephalic. Oropharynx and nasopharynx clear.  NECK:  Supple, no jugular venous distention. No thyroid enlargement, no tenderness.  LUNGS: moving air on auscultation bilaterally, upper airway wheeze noted. No rales, rhonchi or crepitation. No use of accessory muscles of respiration. Improved breathing CARDIOVASCULAR: S1, S2 normal. No murmurs, rubs, or  gallops.  ABDOMEN: Soft, nontender, nondistended. Bowel sounds present. No organomegaly or mass.  EXTREMITIES: No pedal edema, cyanosis, or clubbing.  NEUROLOGIC: Cranial nerves II through XII are intact. Muscle strength 5/5 in all extremities. Sensation intact. Gait not checked.  PSYCHIATRIC: The patient is alert and oriented x 3.  SKIN: No obvious rash, lesion, or ulcer.     DATA REVIEW:   CBC Recent Labs  Lab 11/24/17 0236  WBC 5.8  HGB 12.3  HCT 36.8  PLT 216    Chemistries  Recent Labs  Lab 11/23/17 1900 11/24/17 0236  NA 140 139  K 4.3 3.4*  CL 108 105  CO2 23 20*  GLUCOSE 91 153*  BUN 20 19  CREATININE 0.92 0.99  CALCIUM 9.2 8.9  AST 28  --   ALT 23  --   ALKPHOS 99  --   BILITOT 0.5  --      Microbiology Results  Results for orders placed or performed during the hospital encounter of 11/30/15  Rapid Influenza A&B Antigens ([facility] only)     Status: None   Collection Time: 11/30/15  5:55 PM  Result Value Ref Range Status   Influenza A  [facility]) NOT DETECTED  Final   Influenza B [facility]) NOT DETECTED  Final    RADIOLOGY:  No results found.   Management plans discussed with the patient, family and they are in agreement.  CODE STATUS:     Code Status Orders  (From admission, onward)        Start     Ordered   11/23/17 2047  Full code  Continuous     11/23/17 2050    Code Status History    Date Active Date Inactive Code Status Order ID Comments User Context   This patient has a current code status but no historical code status.      TOTAL TIME TAKING CARE OF THIS PATIENT: 42 minutes.    Enid Baas M.D on 11/26/2017 at 2:25 PM  Between 7am to 6pm - Pager - [phone]  After 6pm go to [email or web address] - Scientist, research (life sciences) [place] [facility]  Office  [phone]  CC: Primary care physician; Center, [facility]   Note: This dictation was prepared with Dragon dictation along with smaller phrase technology. Any transcriptional errors that result from this process are unintentional.

## 2017-11-26 NOTE — Progress Notes (Signed)
Patient is being discharged home with family. A&O x4.  Reviewed meds, scripts, and last dose given. Patient stated that she has a ned machine at home. IV removed with cath intact. Allowed time for questions.

## 2017-12-01 ENCOUNTER — Telehealth: Payer: Self-pay

## 2017-12-01 NOTE — Telephone Encounter (Signed)
Contacted the patient and a couple of the medications are not filled yet as she is waiting on Prior Approval but hopes to hear about them soon.  She had not made her follow-up appointment but plans to do so. No other concerns at this time.

## 2018-01-13 ENCOUNTER — Telehealth: Payer: Self-pay

## 2018-01-13 NOTE — Telephone Encounter (Signed)
Dr. Laban EmperorNaveira wrote the  April 1st instead of the March 30th on prior authorization for her oxycodone and she had to pay for it. Medicaid said that if he did another prior auth for this script and put March 30th on the date she can get her money back.

## 2018-01-13 NOTE — Telephone Encounter (Signed)
PA resubmitted.

## 2018-01-17 ENCOUNTER — Telehealth: Payer: Self-pay | Admitting: *Deleted

## 2018-01-17 NOTE — Telephone Encounter (Signed)
Done per Eli PhillipsKori 01/14/2018. Patient notified via VM.

## 2018-01-24 ENCOUNTER — Telehealth: Payer: Self-pay | Admitting: *Deleted

## 2018-01-24 NOTE — Telephone Encounter (Signed)
PA request for Oxycodone sent. 

## 2018-01-24 NOTE — Telephone Encounter (Signed)
Attempted to call patient. Voice mailbox has not been set up.

## 2018-02-14 ENCOUNTER — Encounter: Payer: Self-pay | Admitting: Nurse Practitioner

## 2018-02-14 ENCOUNTER — Ambulatory Visit: Payer: Medicaid Other | Attending: Nurse Practitioner | Admitting: Nurse Practitioner

## 2018-02-14 ENCOUNTER — Other Ambulatory Visit: Payer: Self-pay

## 2018-02-14 VITALS — BP 121/80 | HR 108 | Temp 98.6°F | Resp 18 | Ht <= 58 in | Wt 115.0 lb

## 2018-02-14 DIAGNOSIS — M79601 Pain in right arm: Secondary | ICD-10-CM | POA: Diagnosis not present

## 2018-02-14 DIAGNOSIS — J440 Chronic obstructive pulmonary disease with acute lower respiratory infection: Secondary | ICD-10-CM | POA: Insufficient documentation

## 2018-02-14 DIAGNOSIS — G894 Chronic pain syndrome: Secondary | ICD-10-CM | POA: Diagnosis not present

## 2018-02-14 DIAGNOSIS — Z79899 Other long term (current) drug therapy: Secondary | ICD-10-CM | POA: Diagnosis not present

## 2018-02-14 DIAGNOSIS — F329 Major depressive disorder, single episode, unspecified: Secondary | ICD-10-CM | POA: Insufficient documentation

## 2018-02-14 DIAGNOSIS — G90523 Complex regional pain syndrome I of lower limb, bilateral: Secondary | ICD-10-CM

## 2018-02-14 DIAGNOSIS — M47816 Spondylosis without myelopathy or radiculopathy, lumbar region: Secondary | ICD-10-CM | POA: Diagnosis not present

## 2018-02-14 DIAGNOSIS — Z888 Allergy status to other drugs, medicaments and biological substances status: Secondary | ICD-10-CM | POA: Insufficient documentation

## 2018-02-14 DIAGNOSIS — Z5181 Encounter for therapeutic drug level monitoring: Secondary | ICD-10-CM | POA: Insufficient documentation

## 2018-02-14 DIAGNOSIS — Z87891 Personal history of nicotine dependence: Secondary | ICD-10-CM | POA: Insufficient documentation

## 2018-02-14 DIAGNOSIS — M79602 Pain in left arm: Secondary | ICD-10-CM | POA: Diagnosis not present

## 2018-02-14 DIAGNOSIS — Z79891 Long term (current) use of opiate analgesic: Secondary | ICD-10-CM | POA: Insufficient documentation

## 2018-02-14 DIAGNOSIS — I509 Heart failure, unspecified: Secondary | ICD-10-CM | POA: Insufficient documentation

## 2018-02-14 DIAGNOSIS — G8929 Other chronic pain: Secondary | ICD-10-CM | POA: Diagnosis not present

## 2018-02-14 DIAGNOSIS — E559 Vitamin D deficiency, unspecified: Secondary | ICD-10-CM | POA: Insufficient documentation

## 2018-02-14 MED ORDER — OXYCODONE HCL 5 MG PO TABS: 5.0000 mg | ORAL_TABLET | Freq: Four times a day (QID) | ORAL | 0 refills | Status: DC | PRN

## 2018-02-14 NOTE — Progress Notes (Signed)
Nursing Pain Medication Assessment:  Safety precautions to be maintained throughout the outpatient stay will include: orient to surroundings, keep bed in low position, maintain call bell within reach at all times, provide assistance with transfer out of bed and ambulation.  Medication Inspection Compliance: Pill count conducted under aseptic conditions, in front of the patient. Neither the pills nor the bottle was removed from the patient's sight at any time. Once count was completed pills were immediately returned to the patient in their original bottle.  Medication: Oxycodone IR Pill/Patch Count: 89 of 120 pills remain Pill/Patch Appearance: Markings consistent with prescribed medication Bottle Appearance: Standard pharmacy container. Clearly labeled. Filled Date: 04/29 / 2019 Last Medication intake:  Today

## 2018-02-14 NOTE — Progress Notes (Signed)
Patient's Name: Elizabeth [name]  MRN: [id number]  Referring Provider: [facility], [place]*  DOB: 1961-07-11  PCP: [facility], [place]: 02/14/2018  Note by: Vevelyn Francois NP  Service setting: Ambulatory outpatient  Specialty: Interventional Pain Management  Location: [facility] (AMB) Pain Management Facility    Patient type: Established    Primary Reason(s) for Visit: Encounter for prescription drug management. (Level of risk: moderate)  CC: Hand Pain (right)  HPI  Elizabeth [name] is a 57 y.o. year old, female patient, who comes today for a medication management evaluation. She has Chronic low back pain; Generalized anxiety disorder; Encounter for therapeutic drug level monitoring; Long term current use of opiate analgesic; Uncomplicated opioid dependence ([place]); Opiate use (30 MME/Day); Chronic pain syndrome; Complex regional pain syndrome I of upper limb (Bilateral); Complex regional pain syndrome I of lower limb (Bilateral); Opiate analgesic use agreement exists; Presence of spinal cord stimulator; Chronic obstructive pulmonary disease (COPD) ([place]); Congestive heart failure ([place]); Vitamin D deficiency; Vitamin B12 deficiency; Chronic upper extremity pain (Bilateral); Chronic lower extremity pain (Bilateral); Long term prescription opiate use; Encounter for chronic pain management; Elevated sedimentation rate; Insomnia secondary to chronic pain; Lumbar facet syndrome (Bilateral); Opioid-induced constipation (OIC); Disorder of bone, unspecified; and COPD with acute bronchitis ([place]) on their problem list. Her primarily concern today is the Hand Pain (right)  Pain Assessment: Location: Right Hand Onset: More than a month ago Duration: Chronic pain Quality: (annoying) Severity: 3 /10 (subjective, self-reported pain score)  Note: Reported level is compatible with observation.                          Timing: Constant Modifying factors: medications BP: 121/80  HR: (!) 108  Elizabeth [name] was  last scheduled for an appointment on 11/16/2017 for medication management. During today's appointment we reviewed Elizabeth [name]'s chronic pain status, as well as her outpatient medication regimen. She admits that her pain is stable however she is sad today. She admits that a friend was killed in a MVA and she is on the way to the funeral.  The patient  reports that she does not use drugs. Her body mass index is 27.73 kg/m.  Further details on both, my assessment(s), as well as the proposed treatment plan, please see below.  Controlled Substance Pharmacotherapy Assessment REMS (Risk Evaluation and Mitigation Strategy)  Analgesic:Oxycodone IR 5 mg 1 tablet by mouth every 6 hours (20 mg/day) MME/day:30 mg/day    Elizabeth Martins, RN  02/14/2018 12:38 PM  Sign at close encounter Nursing Pain Medication Assessment:  Safety precautions to be maintained throughout the outpatient stay will include: orient to surroundings, keep bed in low position, maintain call bell within reach at all times, provide assistance with transfer out of bed and ambulation.  Medication Inspection Compliance: Pill count conducted under aseptic conditions, in front of the patient. Neither the pills nor the bottle was removed from the patient's sight at any time. Once count was completed pills were immediately returned to the patient in their original bottle.  Medication: Oxycodone IR Pill/Patch Count: 89 of 120 pills remain Pill/Patch Appearance: Markings consistent with prescribed medication Bottle Appearance: Standard pharmacy container. Clearly labeled. Filled Date: 04/29 / 2019 Last Medication intake:  Today   Pharmacokinetics: Liberation and absorption (onset of action): WNL Distribution (time to peak effect): WNL Metabolism and excretion (duration of action): WNL         Pharmacodynamics: Desired effects: Analgesia: [name]  reports >50% benefit. Functional ability: Patient reports that medication allows her to  accomplish basic ADLs Clinically meaningful improvement in function (CMIF): Sustained CMIF goals met Perceived effectiveness: Described as relatively effective, allowing for increase in activities of daily living (ADL) Undesirable effects: Side-effects or Adverse reactions: None reported Monitoring: Elizabeth [name] PMP: Online review of the past 47-monthperiod conducted. Compliant with practice rules and regulations Last UDS on record: Summary  Date Value Ref Range Status  11/16/2017 FINAL  Final    Comment:    ==================================================================== TOXASSURE SELECT 13 (MW) ==================================================================== Test                             Result       Flag       Units Drug Present and Declared for Prescription Verification   Oxycodone                      4316         EXPECTED   ng/mg creat   Oxymorphone                    1585         EXPECTED   ng/mg creat   Noroxycodone                   5251         EXPECTED   ng/mg creat   Noroxymorphone                 828          EXPECTED   ng/mg creat    Sources of oxycodone are scheduled prescription medications.    Oxymorphone, noroxycodone, and noroxymorphone are expected    metabolites of oxycodone. Oxymorphone is also available as a    scheduled prescription medication. ==================================================================== Test                      Result    Flag   Units      Ref Range   Creatinine              187              mg/dL      >=20 ==================================================================== Declared Medications:  The flagging and interpretation on this report are based on the  following declared medications.  Unexpected results may arise from  inaccuracies in the declared medications.  **Note: The testing scope of this panel includes these medications:  Oxycodone  **Note: The testing scope of this panel does not include following  reported  medications:  Albuterol  Bisacodyl  Docusate  Melatonin  Polyethylene Glycol ==================================================================== For clinical consultation, please call [phone] ====================================================================    UDS interpretation: Compliant          Medication Assessment Form: Reviewed. Patient indicates being compliant with therapy Treatment compliance: Compliant Risk Assessment Profile: Aberrant behavior: See prior evaluations. None observed or detected today Comorbid factors increasing risk of overdose: See prior notes. No additional risks detected today Risk of substance use disorder (SUD): Low  ORT Scoring interpretation table:  Score <3 = Low Risk for SUD  Score between 4-7 = Moderate Risk for SUD  Score >8 = High Risk for Opioid Abuse   Risk Mitigation Strategies:  Patient Counseling: Covered Patient-Prescriber Agreement (PPA): Present and active  Notification to other healthcare providers: Done  Pharmacologic Plan: No  change in therapy, at this time.             Laboratory Chemistry  Inflammation Markers (CRP: Acute Phase) (ESR: Chronic Phase) Lab Results  Component Value Date   CRP 4.4 08/30/2017   ESRSEDRATE 30 08/30/2017                         Rheumatology Markers No results found for: Elayne Guerin, [facility]                      Renal Function Markers Lab Results  Component Value Date   BUN 19 11/24/2017   CREATININE 0.99 11/24/2017   GFRAA >60 11/24/2017   GFRNONAA >60 11/24/2017                              Hepatic Function Markers Lab Results  Component Value Date   AST 28 11/23/2017   ALT 23 11/23/2017   ALBUMIN 4.1 11/23/2017   ALKPHOS 99 11/23/2017                        Electrolytes Lab Results  Component Value Date   NA 139 11/24/2017   K 3.4 (L) 11/24/2017   CL 105 11/24/2017   CALCIUM 8.9 11/24/2017   MG 2.3 08/30/2017                         Neuropathy Markers Lab Results  Component Value Date   VITAMINB12 284 08/30/2017                        Bone Pathology Markers Lab Results  Component Value Date   VD25OH 25.3 (L) 03/18/2016   VD125OH2TOT 76.2 03/18/2016   25OHVITD1 37 08/30/2017   25OHVITD2 1.0 08/30/2017   25OHVITD3 36 08/30/2017                         Coagulation Parameters Lab Results  Component Value Date   PLT 216 11/24/2017                        Cardiovascular Markers Lab Results  Component Value Date   BNP 16.0 11/23/2017   CKTOTAL 55 03/10/2014   CKMB < 0.5 (L) 02/16/2014   TROPONINI <0.03 11/24/2017   HGB 12.3 11/24/2017   HCT 36.8 11/24/2017                         CA Markers No results found for: CEA, CA125, LABCA2                      Note: Lab results reviewed.  Recent Diagnostic Imaging Results  DG Abd 1 View CLINICAL DATA:  Abdominal discomfort. Patient reports no bowel movement for 2-3 weeks.  EXAM: ABDOMEN - 1 VIEW  COMPARISON:  Scout image for CT scan dated 09/10/2010  FINDINGS: Bowel gas pattern is normal. No dilated bowel. There are multiple tablets seen in the nondistended colon. No fecal impaction.  No visible free air or free fluid. Neurostimulator and wires are visible, unchanged.  No acute bone abnormality.  Degenerative changes at L4-5.  IMPRESSION: Benign-appearing abdomen.  Electronically Signed   By: Lorriane Shire M.D.   On:  11/26/2017 17:42  Complexity Note: Imaging results reviewed. Results shared with Ms. Suto, using Layman's terms.                         Meds   Current Outpatient Medications:  .  albuterol (PROVENTIL HFA;VENTOLIN HFA) 108 (90 Base) MCG/ACT inhaler, Inhale 2 puffs into the lungs every 6 (six) hours as needed for wheezing or shortness of breath., Disp: 1 Inhaler, Rfl: 2 .  atorvastatin (LIPITOR) 40 MG tablet, Take 40 mg by mouth daily., Disp: , Rfl:  .  docusate sodium (COLACE) 100 MG capsule, Take 1 capsule (100 mg  total) by mouth 2 (two) times daily. Hold if diarrhea, Disp: 60 capsule, Rfl: 0 .  furosemide (LASIX) 20 MG tablet, Take 20 mg by mouth., Disp: , Rfl:  .  ipratropium-albuterol (DUONEB) 0.5-2.5 (3) MG/3ML SOLN, Take 3 mLs by nebulization every 6 (six) hours as needed (wheezing)., Disp: 360 mL, Rfl: 0 .  Melatonin 1 MG TABS, Take 1-5 mg by mouth at bedtime as needed., Disp: , Rfl:  .  omeprazole (PRILOSEC) 20 MG capsule, Take 20 mg by mouth 2 (two) times daily., Disp: , Rfl:  .  polyethylene glycol (MIRALAX / GLYCOLAX) packet, Take 17 g by mouth daily., Disp: 14 each, Rfl: 0 .  potassium chloride SA (K-DUR,KLOR-CON) 20 MEQ tablet, Take 20 mEq by mouth daily., Disp: , Rfl:  .  [START ON 03/09/2018] oxyCODONE (OXY IR/ROXICODONE) 5 MG immediate release tablet, Take 1 tablet (5 mg total) by mouth every 6 (six) hours as needed for severe pain., Disp: 120 tablet, Rfl: 0 .  [START ON 05/08/2018] oxyCODONE (OXY IR/ROXICODONE) 5 MG immediate release tablet, Take 1 tablet (5 mg total) by mouth every 6 (six) hours as needed for severe pain., Disp: 120 tablet, Rfl: 0 .  [START ON 04/08/2018] oxyCODONE (OXY IR/ROXICODONE) 5 MG immediate release tablet, Take 1 tablet (5 mg total) by mouth every 6 (six) hours as needed for severe pain., Disp: 120 tablet, Rfl: 0  ROS  Constitutional: Denies any fever or chills Gastrointestinal: No reported hemesis, hematochezia, vomiting, or acute GI distress Musculoskeletal: Denies any acute onset joint swelling, redness, loss of ROM, or weakness Neurological: No reported episodes of acute onset apraxia, aphasia, dysarthria, agnosia, amnesia, paralysis, loss of coordination, or loss of consciousness  Allergies  Ms. Abruzzese is allergic to lisinopril.  PFSH  Drug: Ms. Heuer  reports that she does not use drugs. Alcohol:  reports that she does not drink alcohol. Tobacco:  reports that she has quit smoking. Her smoking use included cigarettes. She smoked 0.50 packs per day. She  has never used smokeless tobacco. Medical:  has a past medical history of Anxiety, Arm pain (07/25/2015), CHF (congestive heart failure) ([place]), Congestive heart failure ([place]) (07/25/2015), COPD (chronic obstructive pulmonary disease) ([place]), Depression, Lower extremity pain (07/25/2015), Reflux, RSD (reflex sympathetic dystrophy), Spinal cord stimulator status, Vitamin B12 deficiency, and Vitamin D deficiency. Surgical: Ms. Veale  has a past surgical history that includes Spinal cord stimulator implant. Family: family history includes Cancer in her father; Cirrhosis in her mother.  Constitutional Exam  General appearance: Well nourished, well developed, and well hydrated. In no apparent acute distress Vitals:   02/14/18 1233  BP: 121/80  Pulse: (!) 108  Resp: 18  Temp: 98.6 F (37 C)  TempSrc: Oral  SpO2: 95%  Weight: 115 lb (52.2 kg)  Height: 4' 6" (1.372 m)   BMI Assessment: Estimated body mass  index is 27.73 kg/m as calculated from the following:   Height as of this encounter: 4' 6" (1.372 m).   Weight as of this encounter: 115 lb (52.2 kg).  BMI interpretation table: BMI level Category Range association with higher incidence of chronic pain  <18 kg/m2 Underweight   18.5-24.9 kg/m2 Ideal body weight   25-29.9 kg/m2 Overweight Increased incidence by 20%  30-34.9 kg/m2 Obese (Class I) Increased incidence by 68%  35-39.9 kg/m2 Severe obesity (Class II) Increased incidence by 136%  >40 kg/m2 Extreme obesity (Class III) Increased incidence by 254%   Patient's current BMI Ideal Body weight  Body mass index is 27.73 kg/m. Patient must be at least 60 in tall to calculate ideal body weight   BMI Readings from Last 4 Encounters:  02/14/18 27.73 kg/m  11/23/17 29.24 kg/m  11/16/17 25.97 kg/m  10/18/17 25.97 kg/m   Wt Readings from Last 4 Encounters:  02/14/18 115 lb (52.2 kg)  11/23/17 135 lb 1.6 oz (61.3 kg)  11/16/17 120 lb (54.4 kg)  10/18/17 120 lb (54.4 kg)   Psych/Mental status: Alert, oriented x 3 (person, place, & time)       Eyes: PERLA Respiratory: No evidence of acute respiratory distress  Assessment  Primary Diagnosis & Pertinent Problem List: The primary encounter diagnosis was Complex regional pain syndrome type 1 of both lower extremities. Diagnoses of Chronic pain of both upper extremities, Lumbar facet syndrome (Bilateral), and Chronic pain syndrome were also pertinent to this visit.  Status Diagnosis  Controlled Controlled Controlled 1. Complex regional pain syndrome type 1 of both lower extremities   2. Chronic pain of both upper extremities   3. Lumbar facet syndrome (Bilateral)   4. Chronic pain syndrome     Problems updated and reviewed during this visit: No problems updated. Plan of Care  Pharmacotherapy (Medications Ordered): Meds ordered this encounter  Medications  . oxyCODONE (OXY IR/ROXICODONE) 5 MG immediate release tablet    Sig: Take 1 tablet (5 mg total) by mouth every 6 (six) hours as needed for severe pain.    Dispense:  120 tablet    Refill:  0    Do not place this medication, or any other prescription from our practice, on "Automatic Refill". Patient may have prescription filled one day early if pharmacy is closed on scheduled refill date. Do not fill until:03/09/2018 To last until: 04/08/2018    Order Specific Question:   Supervising Provider    Answer:   Milinda Pointer [phone]  . oxyCODONE (OXY IR/ROXICODONE) 5 MG immediate release tablet    Sig: Take 1 tablet (5 mg total) by mouth every 6 (six) hours as needed for severe pain.    Dispense:  120 tablet    Refill:  0    Do not place this medication, or any other prescription from our practice, on "Automatic Refill". Patient may have prescription filled one day early if pharmacy is closed on scheduled refill date. Do not fill until:05/08/2018 To last until:06/07/2018    Order Specific Question:   Supervising Provider    Answer:   Milinda Pointer  [phone]  . oxyCODONE (OXY IR/ROXICODONE) 5 MG immediate release tablet    Sig: Take 1 tablet (5 mg total) by mouth every 6 (six) hours as needed for severe pain.    Dispense:  120 tablet    Refill:  0    Do not place this medication, or any other prescription from our practice, on "Automatic Refill". Patient may have prescription  filled one day early if pharmacy is closed on scheduled refill date. Do not fill until:6/28/2019To last until:05/08/2018    Order Specific Question:   Supervising Provider    Answer:   Milinda Pointer [phone]   New Prescriptions   No medications on file   Medications administered today: Esther L. Huxtable had no medications administered during this visit. Lab-work, procedure(s), and/or referral(s): No orders of the defined types were placed in this encounter.  Imaging and/or referral(s): None  Interventional therapies: Planned, scheduled, and/or pending:  Not at this time.   Considering:  Diagnostic bilateral lumbar facet block   Palliative PRN treatment(s):  Palliative stellate ganglion block for the upper extremity complex regional pain syndrome.  Palliative Lumbar sympathetic block for the lower extremity complex regional pain syndrome.  Palliativebilateral lumbar facet block   Provider-requested follow-up: Return in about 3 months (around 05/17/2018) for MedMgmt with Me Donella Stade [facility] Pace).  Future Appointments  Date Time Provider [place]  05/16/2018 10:30 AM Vevelyn Francois, NP [facility] None   Primary Care Physician: [facility], [place] Location: [facility] Outpatient Pain Management Facility Note by: Vevelyn Francois NP Date: 02/14/2018; Time: 1:29 PM  Pain Score Disclaimer: We use the NRS-11 scale. This is a self-reported, subjective measurement of pain severity with only modest accuracy. It is used primarily to identify changes within a particular patient. It must be understood that outpatient pain scales are significantly  less accurate that those used for research, where they can be applied under ideal controlled circumstances with minimal exposure to variables. In reality, the score is likely to be a combination of pain intensity and pain affect, where pain affect describes the degree of emotional arousal or changes in action readiness caused by the sensory experience of pain. Factors such as social and work situation, setting, emotional state, anxiety levels, expectation, and prior pain experience may influence pain perception and show large inter-individual differences that may also be affected by time variables.  Patient instructions provided during this appointment: Patient Instructions  ____________________________________________________________________________________________  Medication Rules  Applies to: All patients receiving prescriptions (written or electronic).  Pharmacy of record: Pharmacy where electronic prescriptions will be sent. If written prescriptions are taken to a different pharmacy, please inform the nursing staff. The pharmacy listed in the electronic medical record should be the one where you would like electronic prescriptions to be sent.  Prescription refills: Only during scheduled appointments. Applies to both, written and electronic prescriptions.  NOTE: The following applies primarily to controlled substances (Opioid* Pain Medications).   Patient's responsibilities: 1. Pain Pills: Bring all pain pills to every appointment (except for procedure appointments). 2. Pill Bottles: Bring pills in original pharmacy bottle. Always bring newest bottle. Bring bottle, even if empty. 3. Medication refills: You are responsible for knowing and keeping track of what medications you need refilled. The day before your appointment, write a list of all prescriptions that need to be refilled. Bring that list to your appointment and give it to the admitting nurse. Prescriptions will be written only during  appointments. If you forget a medication, it will not be "Called in", "Faxed", or "electronically sent". You will need to get another appointment to get these prescribed. 4. Prescription Accuracy: You are responsible for carefully inspecting your prescriptions before leaving our office. Have the discharge nurse carefully go over each prescription with you, before taking them home. Make sure that your name is accurately spelled, that your address is correct. Check the name and dose of your medication  to make sure it is accurate. Check the number of pills, and the written instructions to make sure they are clear and accurate. Make sure that you are given enough medication to last until your next medication refill appointment. 5. Taking Medication: Take medication as prescribed. Never take more pills than instructed. Never take medication more frequently than prescribed. Taking less pills or less frequently is permitted and encouraged, when it comes to controlled substances (written prescriptions).  6. Inform other Doctors: Always inform, all of your healthcare providers, of all the medications you take. 7. Pain Medication from other Providers: You are not allowed to accept any additional pain medication from any other Doctor or Healthcare provider. There are two exceptions to this rule. (see below) In the event that you require additional pain medication, you are responsible for notifying us, as stated below. 8. Medication Agreement: You are responsible for carefully reading and following our Medication Agreement. This must be signed before receiving any prescriptions from our practice. Safely store a copy of your signed Agreement. Violations to the Agreement will result in no further prescriptions. (Additional copies of our Medication Agreement are available upon request.) 9. Laws, Rules, & Regulations: All patients are expected to follow all Federal and [employer], Rules, [employer]. Ignorance of  the Laws does not constitute a valid excuse. The use of any illegal substances is prohibited. 10. Adopted CDC guidelines & recommendations: Target dosing levels will be at or below 60 MME/day. Use of benzodiazepines** is not recommended.  Exceptions: There are only two exceptions to the rule of not receiving pain medications from other Healthcare Providers. 1. Exception #1 (Emergencies): In the event of an emergency (i.e.: accident requiring emergency care), you are allowed to receive additional pain medication. However, you are responsible for: As soon as you are able, call our office [phone], at any time of the day or night, and leave a message stating your name, the date and nature of the emergency, and the name and dose of the medication prescribed. In the event that your call is answered by a member of our staff, make sure to document and save the date, time, and the name of the person that took your information.  2. Exception #2 (Planned Surgery): In the event that you are scheduled by another doctor or dentist to have any type of surgery or procedure, you are allowed (for a period no longer than 30 days), to receive additional pain medication, for the acute post-op pain. However, in this case, you are responsible for picking up a copy of our "Post-op Pain Management for Surgeons" handout, and giving it to your surgeon or dentist. This document is available at our office, and does not require an appointment to obtain it. Simply go to our office during business hours (Monday-Thursday from 8:00 AM to 4:00 PM) (Friday 8:00 AM to 12:00 Noon) or if you have a scheduled appointment with [place], prior to your surgery, and ask for it by name. In addition, you will need to provide [place] with your name, name of your surgeon, type of surgery, and date of procedure or surgery.  *Opioid medications include: morphine, codeine, oxycodone, oxymorphone, hydrocodone, hydromorphone, meperidine, tramadol, tapentadol,  buprenorphine, fentanyl, methadone. **Benzodiazepine medications include: diazepam (Valium), alprazolam (Xanax), clonazepam (Klonopine), lorazepam (Ativan), clorazepate (Tranxene), chlordiazepoxide (Librium), estazolam (Prosom), oxazepam (Serax), temazepam (Restoril), triazolam (Halcion) (Last updated: 12/09/2017) ____________________________________________________________________________________________

## 2018-02-14 NOTE — Patient Instructions (Signed)
____________________________________________________________________________________________  Medication Rules  Applies to: All patients receiving prescriptions (written or electronic).  Pharmacy of record: Pharmacy where electronic prescriptions will be sent. If written prescriptions are taken to a different pharmacy, please inform the nursing staff. The pharmacy listed in the electronic medical record should be the one where you would like electronic prescriptions to be sent.  Prescription refills: Only during scheduled appointments. Applies to both, written and electronic prescriptions.  NOTE: The following applies primarily to controlled substances (Opioid* Pain Medications).   Patient's responsibilities: 1. Pain Pills: Bring all pain pills to every appointment (except for procedure appointments). 2. Pill Bottles: Bring pills in original pharmacy bottle. Always bring newest bottle. Bring bottle, even if empty. 3. Medication refills: You are responsible for knowing and keeping track of what medications you need refilled. The day before your appointment, write a list of all prescriptions that need to be refilled. Bring that list to your appointment and give it to the admitting nurse. Prescriptions will be written only during appointments. If you forget a medication, it will not be "Called in", "Faxed", or "electronically sent". You will need to get another appointment to get these prescribed. 4. Prescription Accuracy: You are responsible for carefully inspecting your prescriptions before leaving our office. Have the discharge nurse carefully go over each prescription with you, before taking them home. Make sure that your name is accurately spelled, that your address is correct. Check the name and dose of your medication to make sure it is accurate. Check the number of pills, and the written instructions to make sure they are clear and accurate. Make sure that you are given enough medication to last  until your next medication refill appointment. 5. Taking Medication: Take medication as prescribed. Never take more pills than instructed. Never take medication more frequently than prescribed. Taking less pills or less frequently is permitted and encouraged, when it comes to controlled substances (written prescriptions).  6. Inform other Doctors: Always inform, all of your healthcare providers, of all the medications you take. 7. Pain Medication from other Providers: You are not allowed to accept any additional pain medication from any other Doctor or Healthcare provider. There are two exceptions to this rule. (see below) In the event that you require additional pain medication, you are responsible for notifying us, as stated below. 8. Medication Agreement: You are responsible for carefully reading and following our Medication Agreement. This must be signed before receiving any prescriptions from our practice. Safely store a copy of your signed Agreement. Violations to the Agreement will result in no further prescriptions. (Additional copies of our Medication Agreement are available upon request.) 9. Laws, Rules, & Regulations: All patients are expected to follow all Federal and State Laws, Statutes, Rules, & Regulations. Ignorance of the Laws does not constitute a valid excuse. The use of any illegal substances is prohibited. 10. Adopted CDC guidelines & recommendations: Target dosing levels will be at or below 60 MME/day. Use of benzodiazepines** is not recommended.  Exceptions: There are only two exceptions to the rule of not receiving pain medications from other Healthcare Providers. 1. Exception #1 (Emergencies): In the event of an emergency (i.e.: accident requiring emergency care), you are allowed to receive additional pain medication. However, you are responsible for: As soon as you are able, call our office [phone], at any time of the day or night, and leave a message stating your name, the  date and nature of the emergency, and the name and dose of the medication   prescribed. In the event that your call is answered by a member of our staff, make sure to document and save the date, time, and the name of the person that took your information.  2. Exception #2 (Planned Surgery): In the event that you are scheduled by another doctor or dentist to have any type of surgery or procedure, you are allowed (for a period no longer than 30 days), to receive additional pain medication, for the acute post-op pain. However, in this case, you are responsible for picking up a copy of our "Post-op Pain Management for Surgeons" handout, and giving it to your surgeon or dentist. This document is available at our office, and does not require an appointment to obtain it. Simply go to our office during business hours (Monday-Thursday from 8:00 AM to 4:00 PM) (Friday 8:00 AM to 12:00 Noon) or if you have a scheduled appointment with us, prior to your surgery, and ask for it by name. In addition, you will need to provide us with your name, name of your surgeon, type of surgery, and date of procedure or surgery.  *Opioid medications include: morphine, codeine, oxycodone, oxymorphone, hydrocodone, hydromorphone, meperidine, tramadol, tapentadol, buprenorphine, fentanyl, methadone. **Benzodiazepine medications include: diazepam (Valium), alprazolam (Xanax), clonazepam (Klonopine), lorazepam (Ativan), clorazepate (Tranxene), chlordiazepoxide (Librium), estazolam (Prosom), oxazepam (Serax), temazepam (Restoril), triazolam (Halcion) (Last updated: 12/09/2017) ____________________________________________________________________________________________    

## 2018-03-23 ENCOUNTER — Other Ambulatory Visit: Payer: Self-pay | Admitting: Nurse Practitioner

## 2018-03-23 ENCOUNTER — Telehealth: Payer: Self-pay | Admitting: Nurse Practitioner

## 2018-03-23 DIAGNOSIS — Z1231 Encounter for screening mammogram for malignant neoplasm of breast: Secondary | ICD-10-CM

## 2018-03-23 NOTE — Telephone Encounter (Signed)
Patient called stating Medicaid want another prior auth and wants to know why patient has to take this medicine

## 2018-03-23 NOTE — Telephone Encounter (Signed)
PA resent with notes.  Patient notified.

## 2018-04-07 ENCOUNTER — Telehealth: Payer: Self-pay | Admitting: *Deleted

## 2018-04-07 NOTE — Telephone Encounter (Signed)
Received referral for low dose lung cancer screening CT scan. Message left at phone number listed in EMR for patient to call me back to facilitate scheduling scan.  

## 2018-04-11 ENCOUNTER — Emergency Department: Payer: Medicaid Other

## 2018-04-11 ENCOUNTER — Other Ambulatory Visit: Payer: Self-pay

## 2018-04-11 ENCOUNTER — Inpatient Hospital Stay
Admission: EM | Admit: 2018-04-11 | Discharge: 2018-04-18 | DRG: 189 | Disposition: A | Discharge status: Home / Self Care | Payer: Medicaid Other | Attending: Internal Medicine | Admitting: Internal Medicine

## 2018-04-11 DIAGNOSIS — G90513 Complex regional pain syndrome I of upper limb, bilateral: Secondary | ICD-10-CM | POA: Diagnosis present

## 2018-04-11 DIAGNOSIS — G9050 Complex regional pain syndrome I, unspecified: Secondary | ICD-10-CM | POA: Diagnosis present

## 2018-04-11 DIAGNOSIS — J441 Chronic obstructive pulmonary disease with (acute) exacerbation: Secondary | ICD-10-CM | POA: Diagnosis present

## 2018-04-11 DIAGNOSIS — Z515 Encounter for palliative care: Secondary | ICD-10-CM

## 2018-04-11 DIAGNOSIS — J9690 Respiratory failure, unspecified, unspecified whether with hypoxia or hypercapnia: Secondary | ICD-10-CM | POA: Diagnosis present

## 2018-04-11 DIAGNOSIS — Z809 Family history of malignant neoplasm, unspecified: Secondary | ICD-10-CM | POA: Diagnosis not present

## 2018-04-11 DIAGNOSIS — E559 Vitamin D deficiency, unspecified: Secondary | ICD-10-CM | POA: Diagnosis present

## 2018-04-11 DIAGNOSIS — K219 Gastro-esophageal reflux disease without esophagitis: Secondary | ICD-10-CM | POA: Diagnosis present

## 2018-04-11 DIAGNOSIS — J9601 Acute respiratory failure with hypoxia: Secondary | ICD-10-CM

## 2018-04-11 DIAGNOSIS — R0603 Acute respiratory distress: Secondary | ICD-10-CM

## 2018-04-11 DIAGNOSIS — Z888 Allergy status to other drugs, medicaments and biological substances status: Secondary | ICD-10-CM

## 2018-04-11 DIAGNOSIS — I5023 Acute on chronic systolic (congestive) heart failure: Secondary | ICD-10-CM | POA: Diagnosis present

## 2018-04-11 DIAGNOSIS — Z7189 Other specified counseling: Secondary | ICD-10-CM

## 2018-04-11 DIAGNOSIS — G90523 Complex regional pain syndrome I of lower limb, bilateral: Secondary | ICD-10-CM | POA: Diagnosis present

## 2018-04-11 DIAGNOSIS — F17210 Nicotine dependence, cigarettes, uncomplicated: Secondary | ICD-10-CM | POA: Diagnosis present

## 2018-04-11 DIAGNOSIS — F419 Anxiety disorder, unspecified: Secondary | ICD-10-CM | POA: Diagnosis present

## 2018-04-11 DIAGNOSIS — J9602 Acute respiratory failure with hypercapnia: Secondary | ICD-10-CM | POA: Diagnosis present

## 2018-04-11 DIAGNOSIS — D72829 Elevated white blood cell count, unspecified: Secondary | ICD-10-CM | POA: Diagnosis present

## 2018-04-11 DIAGNOSIS — E538 Deficiency of other specified B group vitamins: Secondary | ICD-10-CM | POA: Diagnosis present

## 2018-04-11 DIAGNOSIS — I509 Heart failure, unspecified: Secondary | ICD-10-CM | POA: Diagnosis not present

## 2018-04-11 LAB — CBC WITH DIFFERENTIAL/PLATELET
BASOS ABS: 0.1 10*3/uL (ref 0–0.1)
Basophils Relative: 1 %
EOS ABS: 0.2 10*3/uL (ref 0–0.7)
EOS PCT: 3 %
HCT: 38.9 % (ref 35.0–47.0)
Hemoglobin: 13.6 g/dL (ref 12.0–16.0)
Lymphocytes Relative: 24 %
Lymphs Abs: 2.1 10*3/uL (ref 1.0–3.6)
MCH: 31 pg (ref 26.0–34.0)
MCHC: 35.1 g/dL (ref 32.0–36.0)
MCV: 88.3 fL (ref 80.0–100.0)
Monocytes Absolute: 0.5 10*3/uL (ref 0.2–0.9)
Monocytes Relative: 6 %
Neutro Abs: 6 10*3/uL (ref 1.4–6.5)
Neutrophils Relative %: 66 %
PLATELETS: 226 10*3/uL (ref 150–440)
RBC: 4.4 MIL/uL (ref 3.80–5.20)
RDW: 13.3 % (ref 11.5–14.5)
WBC: 9 10*3/uL (ref 3.6–11.0)

## 2018-04-11 LAB — COMPREHENSIVE METABOLIC PANEL
ALT: 17 U/L (ref 0–44)
AST: 27 U/L (ref 15–41)
Albumin: 4.3 g/dL (ref 3.5–5.0)
Alkaline Phosphatase: 132 U/L — ABNORMAL HIGH (ref 38–126)
Anion gap: 12 (ref 5–15)
BUN: 20 mg/dL (ref 6–20)
CHLORIDE: 109 mmol/L (ref 98–111)
CO2: 21 mmol/L — ABNORMAL LOW (ref 22–32)
CREATININE: 0.85 mg/dL (ref 0.44–1.00)
Calcium: 9.2 mg/dL (ref 8.9–10.3)
GFR calc Af Amer: >60 mL/min (ref >60)
GFR calc non Af Amer: >60 mL/min (ref >60)
Glucose, Bld: 100 mg/dL — ABNORMAL HIGH (ref 70–99)
Potassium: 3.9 mmol/L (ref 3.5–5.1)
SODIUM: 142 mmol/L (ref 135–145)
Total Bilirubin: 0.8 mg/dL (ref 0.3–1.2)
Total Protein: 7.7 g/dL (ref 6.5–8.1)

## 2018-04-11 LAB — TROPONIN I: Troponin I: <0.03 ng/mL (ref <0.03)

## 2018-04-11 LAB — GLUCOSE, CAPILLARY: Glucose-Capillary: 120 mg/dL — ABNORMAL HIGH (ref 70–99)

## 2018-04-11 LAB — MRSA PCR SCREENING: MRSA by PCR: NEGATIVE

## 2018-04-11 MED ORDER — METHYLPREDNISOLONE SODIUM SUCC 125 MG IJ SOLR
INTRAMUSCULAR | Status: AC
  Administered 2018-04-11: 125 mg via INTRAVENOUS
  Filled 2018-04-11: qty 2

## 2018-04-11 MED ORDER — BENZONATATE 100 MG PO CAPS
100.0000 mg | ORAL_CAPSULE | Freq: Three times a day (TID) | ORAL | Status: DC
  Administered 2018-04-11 – 2018-04-18 (×19): 100 mg via ORAL
  Filled 2018-04-11 (×19): qty 1

## 2018-04-11 MED ORDER — ACETAMINOPHEN 325 MG PO TABS
650.0000 mg | ORAL_TABLET | Freq: Four times a day (QID) | ORAL | Status: DC | PRN
  Administered 2018-04-17: 650 mg via ORAL
  Filled 2018-04-11: qty 2

## 2018-04-11 MED ORDER — IPRATROPIUM-ALBUTEROL 0.5-2.5 (3) MG/3ML IN SOLN: 3.0000 mL | Freq: Four times a day (QID) | RESPIRATORY_TRACT | Status: DC

## 2018-04-11 MED ORDER — FUROSEMIDE 20 MG PO TABS
20.0000 mg | ORAL_TABLET | Freq: Every day | ORAL | Status: DC
  Administered 2018-04-12 – 2018-04-14 (×2): 20 mg via ORAL
  Filled 2018-04-11 (×2): qty 1

## 2018-04-11 MED ORDER — SODIUM CHLORIDE 0.9 % IV SOLN
250.0000 mL | INTRAVENOUS | Status: DC | PRN
  Administered 2018-04-11 – 2018-04-13 (×2): 250 mL via INTRAVENOUS

## 2018-04-11 MED ORDER — METHYLPREDNISOLONE SODIUM SUCC 125 MG IJ SOLR
125.0000 mg | INTRAMUSCULAR | Status: AC
  Administered 2018-04-11: 125 mg via INTRAVENOUS

## 2018-04-11 MED ORDER — SODIUM CHLORIDE 0.9% FLUSH: 3.0000 mL | INTRAVENOUS | Status: DC | PRN

## 2018-04-11 MED ORDER — NICOTINE 21 MG/24HR TD PT24
21.0000 mg | MEDICATED_PATCH | Freq: Every day | TRANSDERMAL | Status: DC
  Administered 2018-04-11 – 2018-04-18 (×8): 21 mg via TRANSDERMAL
  Filled 2018-04-11 (×8): qty 1

## 2018-04-11 MED ORDER — IPRATROPIUM-ALBUTEROL 0.5-2.5 (3) MG/3ML IN SOLN
3.0000 mL | RESPIRATORY_TRACT | Status: DC
  Administered 2018-04-12 – 2018-04-17 (×33): 3 mL via RESPIRATORY_TRACT
  Filled 2018-04-11 (×33): qty 3

## 2018-04-11 MED ORDER — IPRATROPIUM-ALBUTEROL 0.5-2.5 (3) MG/3ML IN SOLN
RESPIRATORY_TRACT | Status: AC
  Filled 2018-04-11: qty 3

## 2018-04-11 MED ORDER — ALBUTEROL SULFATE (2.5 MG/3ML) 0.083% IN NEBU
5.0000 mg | INHALATION_SOLUTION | Freq: Once | RESPIRATORY_TRACT | Status: AC
  Administered 2018-04-11: 5 mg via RESPIRATORY_TRACT

## 2018-04-11 MED ORDER — SODIUM CHLORIDE 0.9 % IV SOLN
1.0000 g | INTRAVENOUS | Status: DC
  Administered 2018-04-11 – 2018-04-13 (×3): 1 g via INTRAVENOUS
  Filled 2018-04-11 (×2): qty 1
  Filled 2018-04-11: qty 10
  Filled 2018-04-11: qty 1

## 2018-04-11 MED ORDER — ALBUTEROL SULFATE (2.5 MG/3ML) 0.083% IN NEBU
INHALATION_SOLUTION | RESPIRATORY_TRACT | Status: AC
  Administered 2018-04-11: 10 mg
  Filled 2018-04-11: qty 12

## 2018-04-11 MED ORDER — MORPHINE SULFATE (PF) 2 MG/ML IV SOLN
2.0000 mg | Freq: Once | INTRAVENOUS | Status: AC
Start: 2018-04-11 — End: 2018-04-11
  Administered 2018-04-11: 2 mg via INTRAVENOUS

## 2018-04-11 MED ORDER — SODIUM CHLORIDE 0.9% FLUSH
3.0000 mL | Freq: Two times a day (BID) | INTRAVENOUS | Status: DC
  Administered 2018-04-12 – 2018-04-18 (×13): 3 mL via INTRAVENOUS

## 2018-04-11 MED ORDER — ALBUTEROL (5 MG/ML) CONTINUOUS INHALATION SOLN
10.0000 mg/h | INHALATION_SOLUTION | RESPIRATORY_TRACT | Status: AC
  Filled 2018-04-11: qty 20

## 2018-04-11 MED ORDER — HYDROCOD POLST-CPM POLST ER 10-8 MG/5ML PO SUER
ORAL | Status: AC
  Administered 2018-04-11: 5 mL via ORAL
  Filled 2018-04-11: qty 5

## 2018-04-11 MED ORDER — SENNOSIDES-DOCUSATE SODIUM 8.6-50 MG PO TABS: 1.0000 | ORAL_TABLET | Freq: Every evening | ORAL | Status: DC | PRN

## 2018-04-11 MED ORDER — METHYLPREDNISOLONE SODIUM SUCC 40 MG IJ SOLR
40.0000 mg | Freq: Two times a day (BID) | INTRAMUSCULAR | Status: DC
  Administered 2018-04-11 – 2018-04-17 (×12): 40 mg via INTRAVENOUS
  Filled 2018-04-11 (×12): qty 1

## 2018-04-11 MED ORDER — PANTOPRAZOLE SODIUM 40 MG PO TBEC
40.0000 mg | DELAYED_RELEASE_TABLET | Freq: Every day | ORAL | Status: DC
  Administered 2018-04-12 – 2018-04-18 (×7): 40 mg via ORAL
  Filled 2018-04-11 (×7): qty 1

## 2018-04-11 MED ORDER — ONDANSETRON HCL 4 MG PO TABS: 4.0000 mg | ORAL_TABLET | Freq: Four times a day (QID) | ORAL | Status: DC | PRN

## 2018-04-11 MED ORDER — POTASSIUM CHLORIDE CRYS ER 20 MEQ PO TBCR
20.0000 meq | EXTENDED_RELEASE_TABLET | Freq: Every day | ORAL | Status: DC
  Administered 2018-04-12 – 2018-04-18 (×7): 20 meq via ORAL
  Filled 2018-04-11 (×7): qty 1

## 2018-04-11 MED ORDER — ENOXAPARIN SODIUM 40 MG/0.4ML ~~LOC~~ SOLN
40.0000 mg | SUBCUTANEOUS | Status: DC
  Administered 2018-04-11 – 2018-04-17 (×7): 40 mg via SUBCUTANEOUS
  Filled 2018-04-11 (×7): qty 0.4

## 2018-04-11 MED ORDER — AZITHROMYCIN 500 MG IV SOLR
500.0000 mg | Freq: Once | INTRAVENOUS | Status: AC
  Administered 2018-04-11: 500 mg via INTRAVENOUS
  Filled 2018-04-11: qty 500

## 2018-04-11 MED ORDER — ATORVASTATIN CALCIUM 20 MG PO TABS
40.0000 mg | ORAL_TABLET | Freq: Every day | ORAL | Status: DC
  Administered 2018-04-11 – 2018-04-17 (×7): 40 mg via ORAL
  Filled 2018-04-11 (×7): qty 2

## 2018-04-11 MED ORDER — OXYCODONE HCL 5 MG PO TABS
5.0000 mg | ORAL_TABLET | Freq: Four times a day (QID) | ORAL | Status: DC | PRN
  Administered 2018-04-11 – 2018-04-18 (×15): 5 mg via ORAL
  Filled 2018-04-11 (×15): qty 1

## 2018-04-11 MED ORDER — HYDROCOD POLST-CPM POLST ER 10-8 MG/5ML PO SUER
5.0000 mL | Freq: Once | ORAL | Status: AC
  Administered 2018-04-11: 5 mL via ORAL

## 2018-04-11 MED ORDER — IPRATROPIUM-ALBUTEROL 0.5-2.5 (3) MG/3ML IN SOLN
3.0000 mL | Freq: Once | RESPIRATORY_TRACT | Status: AC
  Administered 2018-04-11: 15:00:00 via RESPIRATORY_TRACT

## 2018-04-11 MED ORDER — ONDANSETRON HCL 4 MG/2ML IJ SOLN
4.0000 mg | Freq: Four times a day (QID) | INTRAMUSCULAR | Status: DC | PRN
  Administered 2018-04-11: 4 mg via INTRAVENOUS
  Filled 2018-04-11 (×2): qty 2

## 2018-04-11 MED ORDER — SODIUM CHLORIDE 0.9 % IV SOLN
500.0000 mg | INTRAVENOUS | Status: DC
  Administered 2018-04-12 – 2018-04-13 (×2): 500 mg via INTRAVENOUS
  Filled 2018-04-11 (×3): qty 500

## 2018-04-11 MED ORDER — ALBUTEROL SULFATE (2.5 MG/3ML) 0.083% IN NEBU
2.5000 mg | INHALATION_SOLUTION | RESPIRATORY_TRACT | Status: DC | PRN
  Administered 2018-04-11 – 2018-04-18 (×10): 2.5 mg via RESPIRATORY_TRACT
  Filled 2018-04-11 (×10): qty 3

## 2018-04-11 MED ORDER — FUROSEMIDE 10 MG/ML IJ SOLN
60.0000 mg | Freq: Once | INTRAMUSCULAR | Status: AC
  Administered 2018-04-11: 60 mg via INTRAVENOUS
  Filled 2018-04-11: qty 6

## 2018-04-11 MED ORDER — MELATONIN 5 MG PO TABS
5.0000 mg | ORAL_TABLET | Freq: Every evening | ORAL | Status: DC | PRN
  Administered 2018-04-15 – 2018-04-16 (×2): 5 mg via ORAL
  Filled 2018-04-11 (×5): qty 1

## 2018-04-11 MED ORDER — METHYLPREDNISOLONE SODIUM SUCC 125 MG IJ SOLR: 60.0000 mg | Freq: Four times a day (QID) | INTRAMUSCULAR | Status: DC

## 2018-04-11 MED ORDER — MORPHINE SULFATE (PF) 2 MG/ML IV SOLN
INTRAVENOUS | Status: AC
  Filled 2018-04-11: qty 1

## 2018-04-11 MED ORDER — BUDESONIDE 0.5 MG/2ML IN SUSP
0.5000 mg | Freq: Two times a day (BID) | RESPIRATORY_TRACT | Status: DC
  Administered 2018-04-11 – 2018-04-18 (×14): 0.5 mg via RESPIRATORY_TRACT
  Filled 2018-04-11 (×14): qty 2

## 2018-04-11 MED ORDER — ACETAMINOPHEN 650 MG RE SUPP: 650.0000 mg | Freq: Four times a day (QID) | RECTAL | Status: DC | PRN

## 2018-04-11 MED ORDER — GUAIFENESIN-CODEINE 100-10 MG/5ML PO SOLN
10.0000 mL | ORAL | Status: AC | PRN
  Administered 2018-04-11 – 2018-04-14 (×13): 10 mL via ORAL
  Filled 2018-04-11 (×14): qty 10

## 2018-04-11 NOTE — H&P (Signed)
[facility] [place] [facility]   PATIENT NAME: Elizabeth [name]    MR#:  [id number]  DATE OF BIRTH:  1961-04-12  DATE OF ADMISSION:  04/11/2018  PRIMARY CARE PHYSICIAN: [facility]   REQUESTING/REFERRING PHYSICIAN:   CHIEF COMPLAINT:   Chief Complaint  Patient presents with  . Shortness of Breath    HISTORY OF PRESENT ILLNESS: Elizabeth [name]  is a 57 y.o. female with a known history of COPD, congestive heart failure, acid reflux, anxiety disorder presented to the emergency room for shortness of breath for the last 3 days.  Patient has difficulty breathing and wheezing.  She is an active smoker.  When she presented to the emergency room she was in respiratory distress patient was put on BiPAP and stabilized.  Patient received nebulization treatments and Solu-Medrol in the emergency room.  Currently on BiPAP and still in respiratory distress.  No complaints of any chest pain, no recent travel.  Hospitalist service was consulted for further care.  PAST MEDICAL HISTORY:   Past Medical History:  Diagnosis Date  . Anxiety   . Arm pain 07/25/2015  . CHF (congestive heart failure) (HCC)   . Congestive heart failure (HCC) 07/25/2015  . COPD (chronic obstructive pulmonary disease) (HCC)    never been idagnosed  . Depression   . Lower extremity pain 07/25/2015  . Reflux   . RSD (reflex sympathetic dystrophy)   . Spinal cord stimulator status   . Vitamin B12 deficiency   . Vitamin D deficiency     PAST SURGICAL HISTORY:  Past Surgical History:  Procedure Laterality Date  . SPINAL CORD STIMULATOR IMPLANT     x 2    SOCIAL HISTORY:  Social History   Tobacco Use  . Smoking status: Current Every Day Smoker    Packs/day: 0.50    Types: Cigarettes  . Smokeless tobacco: Never Used  . Tobacco comment: Quit 2 years ago  Substance Use Topics  . Alcohol use: No    Alcohol/week: 0.0 oz    FAMILY HISTORY:  Family History  Problem Relation  Age of Onset  . Cirrhosis Mother   . Cancer Father     DRUG ALLERGIES:  Allergies  Allergen Reactions  . Lisinopril Anaphylaxis    REVIEW OF SYSTEMS:   CONSTITUTIONAL: No fever, has fatigue and weakness.  EYES: No blurred or [name] vision.  EARS, NOSE, AND THROAT: No tinnitus or ear pain.  RESPIRATORY: Has cough, shortness of breath, wheezing  no hemoptysis.  CARDIOVASCULAR: No chest pain, orthopnea, edema.  GASTROINTESTINAL: No nausea, vomiting, diarrhea or abdominal pain.  GENITOURINARY: No dysuria, hematuria.  ENDOCRINE: No polyuria, nocturia,  HEMATOLOGY: No anemia, easy bruising or bleeding SKIN: No rash or lesion. MUSCULOSKELETAL: No joint pain or arthritis.   NEUROLOGIC: No tingling, numbness, weakness.  PSYCHIATRY: No anxiety or depression.   MEDICATIONS AT HOME:  Prior to Admission medications   Medication Sig Start Date End Date Taking? Authorizing Provider  albuterol (PROVENTIL HFA;VENTOLIN HFA) 108 (90 Base) MCG/ACT inhaler Inhale 2 puffs into the lungs every 6 (six) hours as needed for wheezing or shortness of breath. 11/26/17  Yes Enid Baas, MD  atorvastatin (LIPITOR) 40 MG tablet Take 40 mg by mouth at bedtime.    Yes [provider]  furosemide (LASIX) 20 MG tablet Take 20 mg by mouth daily.    Yes [provider]  ipratropium-albuterol (DUONEB) 0.5-2.5 (3) MG/3ML SOLN Take 3 mLs by nebulization every 6 (six) hours  as needed (wheezing). 11/26/17  Yes Enid Baas, MD  Melatonin 1 MG TABS Take 1-5 mg by mouth at bedtime as needed (sleep).    Yes [provider]  omeprazole (PRILOSEC) 20 MG capsule Take 20 mg by mouth daily.    Yes [provider]  oxyCODONE (OXY IR/ROXICODONE) 5 MG immediate release tablet Take 1 tablet (5 mg total) by mouth every 6 (six) hours as needed for severe pain. 04/08/18 05/08/18 Yes King, Shana Chute, NP  polyethylene glycol (MIRALAX / GLYCOLAX) packet Take 17 g by mouth daily. Patient taking  differently: Take 17 g by mouth daily as needed for mild constipation or moderate constipation.  11/27/17  Yes Enid Baas, MD  potassium chloride SA (K-DUR,KLOR-CON) 20 MEQ tablet Take 20 mEq by mouth daily.   Yes [provider]  oxyCODONE (OXY IR/ROXICODONE) 5 MG immediate release tablet Take 1 tablet (5 mg total) by mouth every 6 (six) hours as needed for severe pain. 05/08/18 06/07/18  Barbette Merino, NP      PHYSICAL EXAMINATION:   VITAL SIGNS: Pulse (!) 123, temperature 98 F (36.7 C), temperature source Oral, resp. rate (!) 30, height 4\' 9"  (1.448 m), weight 55.3 kg (122 lb), SpO2 100 %.  GENERAL:  57 y.o.-year-old patient lying in the bed with no acute distress.  EYES: Pupils equal, round, reactive to light and accommodation. No scleral icterus. Extraocular muscles intact.  HEENT: Head atraumatic, normocephalic. Oropharynx and nasopharynx clear.  NECK:  Supple, no jugular venous distention. No thyroid enlargement, no tenderness.  LUNGS: Decreased breath sounds bilaterally, bilateral wheezing.  On BiPAP IPAP 8 EPAP 4 Rate 8 FiO2 30% CARDIOVASCULAR: S1, S2 normal. No murmurs, rubs, or gallops.  ABDOMEN: Soft, nontender, nondistended. Bowel sounds present. No organomegaly or mass.  EXTREMITIES: No pedal edema, cyanosis, or clubbing.  NEUROLOGIC: Cranial nerves II through XII are intact. Muscle strength 5/5 in all extremities. Sensation intact. Gait not checked.  PSYCHIATRIC: The patient is alert and oriented x 3.  SKIN: No obvious rash, lesion, or ulcer.   LABORATORY PANEL:   CBC Recent Labs  Lab 04/11/18 1509  WBC 9.0  HGB 13.6  HCT 38.9  PLT 226  MCV 88.3  MCH 31.0  MCHC 35.1  RDW 13.3  LYMPHSABS 2.1  MONOABS 0.5  EOSABS 0.2  BASOSABS 0.1   ------------------------------------------------------------------------------------------------------------------  Chemistries  Recent Labs  Lab 04/11/18 1509  NA 142  K 3.9  CL 109  CO2 21*  GLUCOSE  100*  BUN 20  CREATININE 0.85  CALCIUM 9.2  AST 27  ALT 17  ALKPHOS 132*  BILITOT 0.8   ------------------------------------------------------------------------------------------------------------------ estimated creatinine clearance is 52.2 mL/min (by C-G formula based on SCr of 0.85 mg/dL). ------------------------------------------------------------------------------------------------------------------ No results for input(s): TSH, T4TOTAL, T3FREE, THYROIDAB in the last 72 hours.  Invalid input(s): FREET3   Coagulation profile No results for input(s): INR, PROTIME in the last 168 hours. ------------------------------------------------------------------------------------------------------------------- No results for input(s): DDIMER in the last 72 hours. -------------------------------------------------------------------------------------------------------------------  Cardiac Enzymes Recent Labs  Lab 04/11/18 1509  TROPONINI <0.03   ------------------------------------------------------------------------------------------------------------------ Invalid input(s): POCBNP  ---------------------------------------------------------------------------------------------------------------  Urinalysis No results found for: COLORURINE, APPEARANCEUR, LABSPEC, PHURINE, GLUCOSEU, HGBUR, BILIRUBINUR, KETONESUR, PROTEINUR, UROBILINOGEN, NITRITE, LEUKOCYTESUR   RADIOLOGY: Dg Chest Portable 1 View  Result Date: 04/11/2018 CLINICAL DATA:  Dyspnea EXAM: PORTABLE CHEST 1 VIEW COMPARISON:  11/23/2017 FINDINGS: There is no focal parenchymal opacity. There is no pleural effusion or pneumothorax. The heart and mediastinal contours are unremarkable. There is a spinal stimulator  noted. The osseous structures are unremarkable. IMPRESSION: No active disease. Electronically Signed   By: Elige KoHetal  Patel   On: 04/11/2018 15:29    EKG: Orders placed or performed during the hospital encounter of 04/11/18   . ED EKG within 10 minutes  . ED EKG within 10 minutes  . EKG 12-Lead  . EKG 12-Lead    IMPRESSION AND PLAN:  57 year old female patient with history of COPD, congestive heart failure, GERD, anxiety disorder presented to the emergency room for shortness of breath and cough.  -Acute respiratory failure hypercapnic and hypoxic Continue BiPAP  admit patient to stepdown unit Intensivist consultation  -Acute COPD exacerbation IV Solu-Medrol 60 MDQ 6 hourly Aggressive nebulization treatment Start patient IV Rocephin and Zithromax antibiotics  -GERD continue proton pump inhibitor  -Chronic congestive heart failure Continue oral Lasix for diuresis  -Tobacco cessation counseled to the patient for 6 minutes Nicotine patch offered  -DVT prophylaxis subcu Lovenox daily   All the records are reviewed and case discussed with ED provider. Management plans discussed with the patient, family and they are in agreement.  CODE STATUS: Full code Code Status History    Date Active Date Inactive Code Status Order ID Comments User Context   11/23/2017 2050 11/26/2017 2224 Full Code [id number]  Auburn BilberryPatel, Shreyang, MD ED       TOTAL CRITICAL CARE TIME TAKING CARE OF THIS PATIENT: 54 minutes.    Ihor AustinPavan Lyan Moyano M.D on 04/11/2018 at 4:22 PM  Between 7am to 6pm - Pager - [phone]  After 6pm go to [email or web address] - password [email or web address] [facility]  [facility]  Office  [phone]  CC: Primary care physician; [facility]

## 2018-04-11 NOTE — ED Provider Notes (Signed)
[facility] Emergency Department Provider Note ____________________________________________   First MD Initiated Contact with Patient 04/11/18 1507     (approximate)  I have reviewed the triage vital signs and the nursing notes.   HISTORY  Chief Complaint Shortness of Breath  EM caveat: Patient extremely short of breath, very tachypneic with frequent coughing unable to provide a complete history.  Her friend who is with her is able to provide some a history that she started getting sick yesterday, but otherwise unable to obtain a full history  HPI Elizabeth [name] is a 57 y.o. female history of COPD and congestive heart failure  Reports slight cough yesterday, then becoming very short of breath.  Throughout the day, worsening today.  Reports she feels that she cannot breathe.  Her chest feels tight all over since yesterday.  No nausea vomiting.  No fever or chill, she reports she started get feel like she had a "chest cold" yesterday   Past Medical History:  Diagnosis Date  . Anxiety   . Arm pain 07/25/2015  . CHF (congestive heart failure) (HCC)   . Congestive heart failure (HCC) 07/25/2015  . COPD (chronic obstructive pulmonary disease) (HCC)    never been idagnosed  . Depression   . Lower extremity pain 07/25/2015  . Reflux   . RSD (reflex sympathetic dystrophy)   . Spinal cord stimulator status   . Vitamin B12 deficiency   . Vitamin D deficiency     Patient Active Problem List   Diagnosis Date Noted  . COPD with acute bronchitis (HCC) 11/23/2017  . Disorder of bone, unspecified 08/30/2017  . Opioid-induced constipation (OIC) 09/15/2016  . Lumbar facet syndrome (Bilateral) 03/18/2016  . 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 Chronic upper extremity pain (Bilateral) 09/23/2015  . Chronic lower extremity pain (Bilateral) 09/23/2015  . Long term prescription opiate use 09/23/2015  . Encounter for chronic pain management 09/23/2015  .  Elevated sedimentation rate 09/23/2015  . Chronic low back pain 07/25/2015  . Generalized anxiety disorder 07/25/2015  . Encounter for therapeutic drug level monitoring 07/25/2015  . Long term current use of opiate analgesic 07/25/2015  . Uncomplicated opioid dependence (HCC) 07/25/2015  . Opiate use (30 MME/Day) 07/25/2015  . Chronic pain syndrome 07/25/2015  . Complex regional pain syndrome I of upper limb (Bilateral) 07/25/2015  . Complex regional pain syndrome I of lower limb (Bilateral) 07/25/2015  . Opiate analgesic use agreement exists 07/25/2015  . Presence of spinal cord stimulator 07/25/2015  . Chronic obstructive pulmonary disease (COPD) (HCC) 07/25/2015  . Congestive heart failure (HCC) 07/25/2015  . Vitamin D deficiency 07/25/2015  . Vitamin B12 deficiency 07/25/2015    Past Surgical History:  Procedure Laterality Date  . SPINAL CORD STIMULATOR IMPLANT     x 2    Prior to Admission medications   Medication Sig Start Date End Date Taking? Authorizing Provider  albuterol (PROVENTIL HFA;VENTOLIN HFA) 108 (90 Base) MCG/ACT inhaler Inhale 2 puffs into the lungs every 6 (six) hours as needed for wheezing or shortness of breath. 11/26/17  Yes Enid Baas, MD  atorvastatin (LIPITOR) 40 MG tablet Take 40 mg by mouth at bedtime.    Yes [provider]  furosemide (LASIX) 20 MG tablet Take 20 mg by mouth daily.    Yes [provider]  ipratropium-albuterol (DUONEB) 0.5-2.5 (3) MG/3ML SOLN Take 3 mLs by nebulization every 6 (six) hours as needed (wheezing). 11/26/17  Yes Enid Baas, MD  Melatonin 1  MG TABS Take 1-5 mg by mouth at bedtime as needed (sleep).    Yes [provider]  omeprazole (PRILOSEC) 20 MG capsule Take 20 mg by mouth daily.    Yes [provider]  oxyCODONE (OXY IR/ROXICODONE) 5 MG immediate release tablet Take 1 tablet (5 mg total) by mouth every 6 (six) hours as needed for severe pain. 04/08/18 05/08/18 Yes King,  Shana Chuterystal M, NP  polyethylene glycol (MIRALAX / GLYCOLAX) packet Take 17 g by mouth daily. Patient taking differently: Take 17 g by mouth daily as needed for mild constipation or moderate constipation.  11/27/17  Yes Enid BaasKalisetti, Radhika, MD  potassium chloride SA (K-DUR,KLOR-CON) 20 MEQ tablet Take 20 mEq by mouth daily.   Yes [provider]  oxyCODONE (OXY IR/ROXICODONE) 5 MG immediate release tablet Take 1 tablet (5 mg total) by mouth every 6 (six) hours as needed for severe pain. 05/08/18 06/07/18  Barbette MerinoKing, Crystal M, NP    Allergies Lisinopril  Family History  Problem Relation Age of Onset  . Cirrhosis Mother   . Cancer Father     Social History Social History   Tobacco Use  . Smoking status: Former Smoker    Packs/day: 0.50    Types: Cigarettes  . Smokeless tobacco: Never Used  . Tobacco comment: Quit 2 years ago  Substance Use Topics  . Alcohol use: No    Alcohol/week: 0.0 oz  . Drug use: No    Review of Systems Constitutional: No fever/chills Eyes: No visual changes. ENT: No sore throat. Cardiovascular: Denies chest pain her whole chest feels tight Respiratory: See HPI  Gastrointestinal: No abdominal pain.  No nausea, no vomiting.  No diarrhea.  No constipation. Genitourinary: Negative for dysuria. Musculoskeletal: Negative for back pain. Skin: Negative for rash. Neurological: Negative for headaches, focal weakness or numbness.    ____________________________________________   PHYSICAL EXAM:  VITAL SIGNS: ED Triage Vitals  Enc Vitals Group     BP --      Pulse Rate 04/11/18 1502 (!) 123     Resp 04/11/18 1502 (!) 30     Temp 04/11/18 1502 98 F (36.7 C)     Temp Source 04/11/18 1502 Oral     SpO2 04/11/18 1459 95 %     Weight 04/11/18 1500 122 lb (55.3 kg)     Height 04/11/18 1500 4\' 9"  (1.448 m)     Head Circumference --      Peak Flow --      Pain Score 04/11/18 1459 10     Pain Loc --      Pain Edu? --      Excl. in GC? --      Constitutional: Alert and oriented. Well appearing and in no acute distress. Eyes: Conjunctivae are normal. Head: Atraumatic. Nose: No congestion/rhinnorhea. Mouth/Throat: Mucous membranes are moist. Neck: No stridor.   Cardiovascular: Tachycardic rate, regular rhythm. Grossly normal heart sounds.  Good peripheral circulation. Respiratory: Very tachypnea, and expiratory wheezing in the lower fields bilaterally.  Patient using accessory muscles, sitting up, coughing very frequently a dry nonproductive cough Does appear dyspneic Gastrointestinal: Soft and nontender. No distention. Musculoskeletal: No lower extremity tenderness nor edema. Neurologic:  Normal speech and language. No gross focal neurologic deficits are appreciated.  Skin:  Skin is warm, dry and intact. No rash noted. Psychiatric: Mood and affect are normal. Speech and behavior are normal.  ____________________________________________   LABS (all labs ordered are listed, but only abnormal results are displayed)  Labs  Reviewed  COMPREHENSIVE METABOLIC PANEL - Abnormal; Notable for the following components:      Result Value   CO2 21 (*)    Glucose, Bld 100 (*)    Alkaline Phosphatase 132 (*)    All other components within normal limits  TROPONIN I  CBC WITH DIFFERENTIAL/PLATELET  POC URINE PREG, ED   ____________________________________________  EKG  Reviewed by me at 1520 Heart rate 110 QRS 110 QTc 460 Sinus tachycardia, no ischemic changes denoted ____________________________________________  RADIOLOGY  Chest x-ray reviewed negative for acute ____________________________________________   PROCEDURES  Procedure(s) performed: None  Procedures  Critical Care performed: Yes, see critical care note(s)  CRITICAL CARE Performed by: Sharyn Creamer   Total critical care time: 40 minutes  Critical care time was exclusive of separately billable procedures and treating other patients.  Critical care  was necessary to treat or prevent imminent or life-threatening deterioration.  Critical care was time spent personally by me on the following activities: development of treatment plan with patient and/or surrogate as well as nursing, discussions with consultants, evaluation of patient's response to treatment, examination of patient, obtaining history from patient or surrogate, ordering and performing treatments and interventions, ordering and review of laboratory studies, ordering and review of radiographic studies, pulse oximetry and re-evaluation of patient's condition.  Patient with severe increased work of breathing, multiple nebs, continuous neb, and need for BiPAP.  High risk for respiratory decompensation. ____________________________________________   INITIAL IMPRESSION / ASSESSMENT AND PLAN / ED COURSE  Pertinent labs & imaging results that were available during my care of the patient were reviewed by me and considered in my medical decision making (see chart for details).  Cough dyspnea wheezing.  History of COPD.  Proceeding symptoms sound likely of upper respiratory infection.  She is also a smoker.  Clinical Course as of Apr 11 1604  Mon Apr 11, 2018  1535 Despite initial multiple breathing treatments, the patient still has ongoing dyspnea with what appears to be increased work of breathing at this time.  She is not notably dyspneic, accessory muscle use sitting bolt upright and in a somewhat tripoding position.  She does not show evidence of acute fatigue, but does appear to be worsening in her lung sounds now have much more notable wheezing I suspect she was extremely restrictive on arrival.  I have ordered stat BiPAP therapy, continuous albuterol at this time.  Discussed with nurse Carollee Herter, monitoring the patient very closely.   [MQ]    Clinical Course User Index [MQ] Sharyn Creamer, MD   ----------------------------------------- 4:04 PM on  04/11/2018 -----------------------------------------  Patient resting much more comfortably.  She is now able to speak over BiPAP reports she feels much better.  Chest tightness improved, her work of breathing is notably better, tolerating BiPAP very well.  Heart rate now in the mid 90s, oxygen saturation 1 her percent 30% FiO2.  Admission discussed with patient who is agreeable.  Discussed case with Dr. Nemiah Commander of hospitalist service.  Patient much improved at this time  ____________________________________________   FINAL CLINICAL IMPRESSION(S) / ED DIAGNOSES  Final diagnoses:  COPD exacerbation (HCC)  Acute respiratory distress      NEW MEDICATIONS STARTED DURING THIS VISIT:  New Prescriptions   No medications on file     Note:  This document was prepared using Dragon voice recognition software and may include unintentional dictation errors.     Sharyn Creamer, MD 04/11/18 1606

## 2018-04-11 NOTE — ED Triage Notes (Signed)
Pt presents to the ED from home with shortness of breath and diminished lungs sounds in all fields with wheezing - reports that she started with shortness of breath yesterday that got increasingly worse today

## 2018-04-11 NOTE — ED Notes (Signed)
Pt to ER c/o SOB. Pt in distress. Audible wheezing. Oxygen 89% on RA. Nebulizer started.

## 2018-04-11 NOTE — Consult Note (Signed)
PATIENT NAME: Elizabeth [name]    MR#:  [id number]  DATE OF BIRTH:  1961/02/17  DATE OF ADMISSION:  04/11/2018  PRIMARY CARE PHYSICIAN: [facility]   REQUESTING/REFERRING PHYSICIAN: Pyreddy  CHIEF COMPLAINT:   resp distress   HISTORY OF PRESENT ILLNESS:  57 y.o. female with a known history of COPD, congestive heart failure, acid reflux, anxiety disorder presented to the emergency room for shortness of breath for the last 3 days.  Patient has difficulty breathing and wheezing.  She is an active smoker.   When she presented to the emergency room she was in respiratory distress patient was put on BiPAP and stabilized.   Patient received nebulization treatments and Solu-Medrol in the emergency room.  Currently on BiPAP and still in respiratory distress.  No complaints of any chest pain, no recent travel.    She was given 1 hr continious neb therapy still wheezing   ROS limited due to resp distress  PAST MEDICAL HISTORY:   Past Medical History:  Diagnosis Date  . Anxiety   . Arm pain 07/25/2015  . CHF (congestive heart failure) (HCC)   . Congestive heart failure (HCC) 07/25/2015  . COPD (chronic obstructive pulmonary disease) (HCC)    never been idagnosed  . Depression   . Lower extremity pain 07/25/2015  . Reflux   . RSD (reflex sympathetic dystrophy)   . Spinal cord stimulator status   . Vitamin B12 deficiency   . Vitamin D deficiency     PAST SURGICAL HISTORY:  Past Surgical History:  Procedure Laterality Date  . SPINAL CORD STIMULATOR IMPLANT     x 2    SOCIAL HISTORY:  Social History   Tobacco Use  . Smoking status: Current Every Day Smoker    Packs/day: 0.50    Types: Cigarettes  . Smokeless tobacco: Never Used  . Tobacco comment: Quit 2 years ago  Substance Use Topics  . Alcohol use: No    Alcohol/week: 0.0 oz    FAMILY HISTORY:  Family History  Problem Relation Age of Onset  . Cirrhosis Mother   . Cancer Father     DRUG  ALLERGIES:  Allergies  Allergen Reactions  . Lisinopril Anaphylaxis    REVIEW OF SYSTEMS:  Limited due to resp distress   MEDICATIONS AT HOME:  Prior to Admission medications   Medication Sig Start Date End Date Taking? Authorizing Provider  albuterol (PROVENTIL HFA;VENTOLIN HFA) 108 (90 Base) MCG/ACT inhaler Inhale 2 puffs into the lungs every 6 (six) hours as needed for wheezing or shortness of breath. 11/26/17  Yes Enid Baas, MD  atorvastatin (LIPITOR) 40 MG tablet Take 40 mg by mouth at bedtime.    Yes [provider]  furosemide (LASIX) 20 MG tablet Take 20 mg by mouth daily.    Yes [provider]  ipratropium-albuterol (DUONEB) 0.5-2.5 (3) MG/3ML SOLN Take 3 mLs by nebulization every 6 (six) hours as needed (wheezing). 11/26/17  Yes Enid Baas, MD  Melatonin 1 MG TABS Take 1-5 mg by mouth at bedtime as needed (sleep).    Yes [provider]  omeprazole (PRILOSEC) 20 MG capsule Take 20 mg by mouth daily.    Yes [provider]  oxyCODONE (OXY IR/ROXICODONE) 5 MG immediate release tablet Take 1 tablet (5 mg total) by mouth every 6 (six) hours as needed for severe pain. 04/08/18 05/08/18 Yes King, Shana Chute, NP  polyethylene glycol (MIRALAX / GLYCOLAX) packet Take 17 g by mouth daily. Patient taking differently:  Take 17 g by mouth daily as needed for mild constipation or moderate constipation.  11/27/17  Yes Enid Baas, MD  potassium chloride SA (K-DUR,KLOR-CON) 20 MEQ tablet Take 20 mEq by mouth daily.   Yes [provider]  oxyCODONE (OXY IR/ROXICODONE) 5 MG immediate release tablet Take 1 tablet (5 mg total) by mouth every 6 (six) hours as needed for severe pain. 05/08/18 06/07/18  Barbette Merino, NP      PHYSICAL EXAMINATION:   VITAL SIGNS: Pulse (!) 123, temperature 99 F (37.2 C), temperature source Axillary, resp. rate (!) 30, height 4\' 9"  (1.448 m), weight 122 lb (55.3 kg), SpO2 100 %.  GENERAL:  57  y.o.-year-old patient lying in the bed + acute distress.  EYES: Pupils equal, round, reactive to light and accommodation. No scleral icterus. Extraocular muscles intact.  HEENT: Head atraumatic, normocephalic. Oropharynx and nasopharynx clear.  NECK:  Supple, no jugular venous distention. No thyroid enlargement, no tenderness.  LUNGS: Decreased breath sounds bilaterally, bilateral wheezing.  On BiPAP IPAP 8 EPAP 4 Rate 8 FiO2 30% CARDIOVASCULAR: S1, S2 normal. No murmurs, rubs, or gallops.  ABDOMEN: Soft, nontender, nondistended. Bowel sounds present. No organomegaly or mass.  EXTREMITIES: No pedal edema, cyanosis, or clubbing.  NEUROLOGIC: Cranial nerves II through XII are intact. Muscle strength 5/5 in all extremities. Sensation intact. Gait not checked.  PSYCHIATRIC: The patient is alert and oriented x 3.  SKIN: No obvious rash, lesion, or ulcer.   LABORATORY PANEL:   CBC Recent Labs  Lab 04/11/18 1509  WBC 9.0  HGB 13.6  HCT 38.9  PLT 226  MCV 88.3  MCH 31.0  MCHC 35.1  RDW 13.3  LYMPHSABS 2.1  MONOABS 0.5  EOSABS 0.2  BASOSABS 0.1   ------------------------------------------------------------------------------------------------------------------  Chemistries  Recent Labs  Lab 04/11/18 1509  NA 142  K 3.9  CL 109  CO2 21*  GLUCOSE 100*  BUN 20  CREATININE 0.85  CALCIUM 9.2  AST 27  ALT 17  ALKPHOS 132*  BILITOT 0.8   ------------------------------------------------------------------------------------------------------------------ estimated creatinine clearance is 52.2 mL/min (by C-G formula based on SCr of 0.85 mg/dL). ------------------------------------------------------------------------------------------------------------------ No results for input(s): TSH, T4TOTAL, T3FREE, THYROIDAB in the last 72 hours.  Invalid input(s): FREET3   Coagulation profile No results for input(s): INR, PROTIME in the last 168  hours. ------------------------------------------------------------------------------------------------------------------- No results for input(s): DDIMER in the last 72 hours. -------------------------------------------------------------------------------------------------------------------  Cardiac Enzymes Recent Labs  Lab 04/11/18 1509  TROPONINI <0.03   ------------------------------------------------------------------------------------------------------------------ Invalid input(s): POCBNP  ---------------------------------------------------------------------------------------------------------------  Urinalysis No results found for: COLORURINE, APPEARANCEUR, LABSPEC, PHURINE, GLUCOSEU, HGBUR, BILIRUBINUR, KETONESUR, PROTEINUR, UROBILINOGEN, NITRITE, LEUKOCYTESUR   RADIOLOGY: Dg Chest Portable 1 View  Result Date: 04/11/2018 CLINICAL DATA:  Dyspnea EXAM: PORTABLE CHEST 1 VIEW COMPARISON:  11/23/2017 FINDINGS: There is no focal parenchymal opacity. There is no pleural effusion or pneumothorax. The heart and mediastinal contours are unremarkable. There is a spinal stimulator noted. The osseous structures are unremarkable. IMPRESSION: No active disease. Electronically Signed   By: Elige Ko   On: 04/11/2018 15:29    EKG: Orders placed or performed during the hospital encounter of 04/11/18  . ED EKG within 10 minutes  . ED EKG within 10 minutes  . EKG 12-Lead  . EKG 12-Lead    Assessment AND PLAN:  57 year old female patient with history of COPD, congestive heart failure, GERD, anxiety disorder presented to the emergency room for shortness of breath and cough.  DX of acute COPD exacerbation, probable acute systolic/diastolic cardiac  dysfunction  -Acute respiratory failure hypercapnic and hypoxic Continue BiPAP -Iv steroids IV abx IV lasix x 1 dose  -Acute COPD exacerbation IV Solu-Medrol 60 MDQ 6 hourly Aggressive nebulization treatment IV Rocephin and Zithromax  antibiotics  -GERD continue proton pump inhibitor  -Chronic congestive heart failure IV lasix x 1 dose and assess response  -Tobacco cessation counseled to the patient for 6 minutes Nicotine patch offered  -DVT prophylaxis  Lovenox daily    Lucie LeatherKurian David Ceanna Wareing, M.D.  [facility]  Medical Director [facility] Medical Director [facility]

## 2018-04-12 ENCOUNTER — Other Ambulatory Visit: Payer: Self-pay

## 2018-04-12 DIAGNOSIS — J441 Chronic obstructive pulmonary disease with (acute) exacerbation: Secondary | ICD-10-CM

## 2018-04-12 DIAGNOSIS — J9601 Acute respiratory failure with hypoxia: Secondary | ICD-10-CM

## 2018-04-12 LAB — CBC
HEMATOCRIT: 40.5 % (ref 35.0–47.0)
HEMOGLOBIN: 13.8 g/dL (ref 12.0–16.0)
MCH: 30.5 pg (ref 26.0–34.0)
MCHC: 33.9 g/dL (ref 32.0–36.0)
MCV: 90 fL (ref 80.0–100.0)
Platelets: 251 10*3/uL (ref 150–440)
RBC: 4.51 MIL/uL (ref 3.80–5.20)
RDW: 13.6 % (ref 11.5–14.5)
WBC: 13.8 10*3/uL — ABNORMAL HIGH (ref 3.6–11.0)

## 2018-04-12 LAB — BASIC METABOLIC PANEL
ANION GAP: 13 (ref 5–15)
BUN: 27 mg/dL — ABNORMAL HIGH (ref 6–20)
CALCIUM: 9 mg/dL (ref 8.9–10.3)
CO2: 25 mmol/L (ref 22–32)
Chloride: 105 mmol/L (ref 98–111)
Creatinine, Ser: 0.84 mg/dL (ref 0.44–1.00)
GFR calc Af Amer: >60 mL/min (ref >60)
GFR calc non Af Amer: >60 mL/min (ref >60)
GLUCOSE: 128 mg/dL — AB (ref 70–99)
Potassium: 4.2 mmol/L (ref 3.5–5.1)
Sodium: 143 mmol/L (ref 135–145)

## 2018-04-12 LAB — BLOOD GAS, VENOUS
Acid-base deficit: 0.4 mmol/L (ref 0.0–2.0)
BICARBONATE: 26.7 mmol/L (ref 20.0–28.0)
O2 Saturation: 56.8 %
PATIENT TEMPERATURE: 37
PCO2 VEN: 53 mmHg (ref 44.0–60.0)
PO2 VEN: 33 mmHg (ref 32.0–45.0)
pH, Ven: 7.31 (ref 7.250–7.430)

## 2018-04-12 LAB — MAGNESIUM: MAGNESIUM: 2.5 mg/dL — AB (ref 1.7–2.4)

## 2018-04-12 LAB — PHOSPHORUS: PHOSPHORUS: 6.8 mg/dL — AB (ref 2.5–4.6)

## 2018-04-12 MED ORDER — MORPHINE SULFATE (PF) 2 MG/ML IV SOLN
2.0000 mg | INTRAVENOUS | Status: DC | PRN
  Administered 2018-04-12 – 2018-04-15 (×15): 2 mg via INTRAVENOUS
  Filled 2018-04-12 (×15): qty 1

## 2018-04-12 MED ORDER — MORPHINE SULFATE (PF) 2 MG/ML IV SOLN
INTRAVENOUS | Status: AC
  Administered 2018-04-12: 2 mg via INTRAVENOUS
  Filled 2018-04-12: qty 1

## 2018-04-12 MED ORDER — LORAZEPAM 2 MG/ML IJ SOLN
0.5000 mg | INTRAMUSCULAR | Status: DC | PRN
  Administered 2018-04-12 – 2018-04-14 (×4): 0.5 mg via INTRAVENOUS
  Filled 2018-04-12 (×4): qty 1

## 2018-04-12 MED ORDER — DEXMEDETOMIDINE HCL IN NACL 400 MCG/100ML IV SOLN
0.0000 ug/kg/h | INTRAVENOUS | Status: DC
  Administered 2018-04-12: 0.4 ug/kg/h via INTRAVENOUS
  Administered 2018-04-12: 0.3 ug/kg/h via INTRAVENOUS
  Administered 2018-04-13: 0.5 ug/kg/h via INTRAVENOUS
  Administered 2018-04-14: 0.8 ug/kg/h via INTRAVENOUS
  Administered 2018-04-14: 0.4 ug/kg/h via INTRAVENOUS
  Administered 2018-04-15: 0.6 ug/kg/h via INTRAVENOUS
  Administered 2018-04-15: 0.8 ug/kg/h via INTRAVENOUS
  Administered 2018-04-16: 0.4 ug/kg/h via INTRAVENOUS
  Filled 2018-04-12 (×8): qty 100

## 2018-04-12 NOTE — Progress Notes (Signed)
[facility] [place] [facility]   PATIENT NAME: Elizabeth FellersRita [name]    MR#:  [id number]  DATE OF BIRTH:  1961/03/12  SUBJECTIVE:  CHIEF COMPLAINT: Patient is on BiPAP very lethargic arousable but not answering  REVIEW OF SYSTEMS:  Review of system unobtainable  DRUG ALLERGIES:   Allergies  Allergen Reactions  . Lisinopril Anaphylaxis    VITALS:  Blood pressure 104/62, pulse 96, temperature 98.7 F (37.1 C), temperature source Axillary, resp. rate (!) 29, height 4\' 9"  (1.448 m), weight 62.3 kg (137 lb 5.6 oz), SpO2 (!) 88 %.  PHYSICAL EXAMINATION:  GENERAL:  57 y.o.-year-old patient lying in the bed with no acute distress.  EYES: Pupils equal, round, reactive to light and accommodation. No scleral icterus. Extraocular muscles intact.  HEENT: Head atraumatic, normocephalic. Oropharynx and nasopharynx clear.  NECK:  Supple, no jugular venous distention. No thyroid enlargement, no tenderness.  LUNGS: Normal breath sounds bilaterally, no wheezing, rales,rhonchi or crepitation. No use of accessory muscles of respiration. bIPAP CARDIOVASCULAR: S1, S2 normal. No murmurs, rubs, or gallops.  ABDOMEN: Soft, nontender, nondistended. Bowel sounds present. No organomegaly or mass.  EXTREMITIES: No pedal edema, cyanosis, or clubbing.  NEUROLOGIC: Altered PSYCHIATRIC: The patient is delirious SKIN: No obvious rash, lesion, or ulcer.    LABORATORY PANEL:   CBC Recent Labs  Lab 04/12/18 0718  WBC 13.8*  HGB 13.8  HCT 40.5  PLT 251   ------------------------------------------------------------------------------------------------------------------  Chemistries  Recent Labs  Lab 04/11/18 1509 04/12/18 0718  NA 142 143  K 3.9 4.2  CL 109 105  CO2 21* 25  GLUCOSE 100* 128*  BUN 20 27*  CREATININE 0.85 0.84  CALCIUM 9.2 9.0  MG  --  2.5*  AST 27  --   ALT 17  --   ALKPHOS 132*  --   BILITOT 0.8  --     ------------------------------------------------------------------------------------------------------------------  Cardiac Enzymes Recent Labs  Lab 04/11/18 1509  TROPONINI <0.03   ------------------------------------------------------------------------------------------------------------------  RADIOLOGY:  Dg Chest Portable 1 View  Result Date: 04/11/2018 CLINICAL DATA:  Dyspnea EXAM: PORTABLE CHEST 1 VIEW COMPARISON:  11/23/2017 FINDINGS: There is no focal parenchymal opacity. There is no pleural effusion or pneumothorax. The heart and mediastinal contours are unremarkable. There is a spinal stimulator noted. The osseous structures are unremarkable. IMPRESSION: No active disease. Electronically Signed   By: Elige KoHetal  Patel   On: 04/11/2018 15:29    EKG:   Orders placed or performed during the hospital encounter of 04/11/18  . ED EKG within 10 minutes  . ED EKG within 10 minutes  . EKG 12-Lead  . EKG 12-Lead  . EKG    ASSESSMENT AND PLAN:   57 year old female patient with history of COPD, congestive heart failure, GERD, anxiety disorder presented to the emergency room for shortness of breath and cough.  -Acute respiratory failure hypercapnic and hypoxic due to COPD exacerbation and CHF exacerbation Continue BiPAP, wean off as tolerated  Intensivist following  -Acute COPD exacerbation IV Solu-Medrol 60 MDQ 6 hourly Aggressive nebulization treatment Start patient IV Rocephin and Zithromax antibiotics  -Asymptomatic sinus bradycardia continue close monitoring if necessary we will put a cardiology consult.  Check TSH in a.m.  -GERD continue proton pump inhibitor  -Acute on chronic congestive heart failure Continue oral Lasix for diuresis Monitor intake and output and daily weights  -Tobacco cessation counseled to the patient for 6 minutes Nicotine patch offered  -DVT prophylaxis subcu Lovenox daily  All the records are reviewed and case discussed with  Care Management/Social Workerr. Management plans discussed with the patient, family and they are in agreement.  CODE STATUS: FC   TOTAL TIME TAKING CARE OF THIS PATIENT: .   POSSIBLE D/C IN 2-3 DAYS, DEPENDING ON CLINICAL CONDITION.  Note: This dictation was prepared with Dragon dictation along with smaller phrase technology. Any transcriptional errors that result from this process are unintentional.   Ramonita Lab M.D on 04/12/2018 at 4:49 PM  Between 7am to 6pm - Pager - [phone] After 6pm go to [email or web address] - password [email or web address] [facility]  [place] [facility]  Office  [phone]  CC: Primary care physician; [facility]

## 2018-04-12 NOTE — Progress Notes (Signed)
Could be heard coughing.  Pulled guaifenesin and administered same.   C/O being unable to catch her breath.  Breathing treatment administered.  IV morphine administered.  After 5-7 minuted breathing slowed and O2 sats recovered.  Will continue to monitor.

## 2018-04-12 NOTE — Progress Notes (Signed)
Bipap off since 2000.  Doing well until 2115 when she c/o sob.  Morphine administered for breathing.  Bipap re-established.  Tessalon and robitussin administered.  Eventually slowed breathing and O2 sats recovered.

## 2018-04-12 NOTE — Progress Notes (Signed)
Follow up - Critical Care Medicine Note  Patient Details:    Elizabeth [name] is an 57 y.o. female.57 y.o. female with a known history of COPD, congestive heart failure, GERD, anxiety disorder presented to the emergency room for shortness of breath for the last 3 days.  Was started on BiPAP and moved to the intensive care unit  Lines, Airways, Drains: External Urinary Catheter (Active)  Collection Container Dedicated Suction Canister 04/12/2018  1:00 AM  Securement Method Other (Comment) 04/12/2018  1:00 AM  Output (mL) 1150 mL 04/11/2018  7:45 PM    Anti-infectives:  Anti-infectives (From admission, onward)   Start     Dose/Rate Route Frequency Ordered Stop   04/12/18 1400  azithromycin (ZITHROMAX) 500 mg in sodium chloride 0.9 % 250 mL IVPB     500 mg 250 mL/hr over 60 Minutes Intravenous Every 24 hours 04/11/18 1620     04/11/18 1800  cefTRIAXone (ROCEPHIN) 1 g in sodium chloride 0.9 % 100 mL IVPB     1 g 200 mL/hr over 30 Minutes Intravenous Every 24 hours 04/11/18 1619     04/11/18 1600  azithromycin (ZITHROMAX) 500 mg in sodium chloride 0.9 % 250 mL IVPB     500 mg 250 mL/hr over 60 Minutes Intravenous  Once 04/11/18 1555 04/11/18 1755      Microbiology: Results for orders placed or performed during the hospital encounter of 04/11/18  MRSA PCR Screening     Status: None   Collection Time: 04/11/18  5:30 PM  Result Value Ref Range Status   MRSA by PCR NEGATIVE NEGATIVE Final    Comment:        The GeneXpert MRSA Assay (FDA approved for NASAL specimens only), is one component of a comprehensive MRSA colonization surveillance program. It is not intended to diagnose MRSA infection nor to guide or monitor treatment for MRSA infections. Performed at [facility], [address] [place], [place] [id number]    Studies: Dg Chest Portable 1 View  Result Date: 04/11/2018 CLINICAL DATA:  Dyspnea EXAM: PORTABLE CHEST 1 VIEW COMPARISON:  11/23/2017 FINDINGS: There is no  focal parenchymal opacity. There is no pleural effusion or pneumothorax. The heart and mediastinal contours are unremarkable. There is a spinal stimulator noted. The osseous structures are unremarkable. IMPRESSION: No active disease. Electronically Signed   By: Elige KoHetal  Patel   On: 04/11/2018 15:29    Subjective:    Overnight Issues: patient has had multiple episodes of worsening of her breathing. Resume on BiPAP. Has been given Ativan, morphine and has been started on low-dose Precedex for comfort and anxiety  Objective:  Vital signs for last 24 hours: Temp:  [98 F (36.7 C)-99 F (37.2 C)] 98.9 F (37.2 C) (07/02 0100) Pulse Rate:  [83-123] 97 (07/02 0300) Resp:  [18-33] 23 (07/02 0300) BP: (116-127)/(59-78) 118/70 (07/02 0300) SpO2:  [90 %-100 %] 93 % (07/02 0300) FiO2 (%):  [35 %] 35 % (07/02 0440) Weight:  [122 lb (55.3 kg)-137 lb 5.6 oz (62.3 kg)] 137 lb 5.6 oz (62.3 kg) (07/01 1728)  Hemodynamic parameters for last 24 hours:    Intake/Output from previous day: 07/01 0701 - 07/02 0700 In: -  Out: 1150 [Urine:1150]  Intake/Output this shift: No intake/output data recorded.  Vent settings for last 24 hours: FiO2 (%):  [35 %] 35 %  Physical Exam:  GENERAL:  57 y.o.-year-old patient lying in the bed + acute distress.  HEENT: Head atraumatic, normocephalic. Oropharynx and nasopharynx clear. Accessory  muscle utilization noted LUNGS: Decreased breath sounds bilaterally, bilateral wheezing.  CARDIOVASCULAR: regular rhythm, tachycardia noted, distant heart sounds ABDOMEN: Soft, nontender, nondistended. Bowel sounds present. No organomegaly or mass.  EXTREMITIES: No pedal edema, cyanosis, or clubbing.  NEUROLOGIC: Cranial nerves II through XII are intact. Muscle strength 5/5 in all extremities. PSYCHIATRIC: The patient is alert and oriented x 3.   Assessment/Plan:   Patient with severe COPD and respiratory failure. Presently on azithromycin, Rocephin, Solu-Medrol, albuterol,  Atrovent. She is presently on BiPAP and Precedex. She has a high risk for requiring intubation. Will monitor closely  Superimposed CHF. Responded well to diuretics with 1.1 L out. Pending echocardiogram  Leukocytosis. On broad-spectrum antibody coverage  Critical Care Total Time: 35 minutes  Shakia Sebastiano 04/12/2018  *Care during the described time interval was provided by me and/or other providers on the critical care team.  I have reviewed this patient's available data, including medical history, events of note, physical examination and test results as part of my evaluation.

## 2018-04-13 ENCOUNTER — Inpatient Hospital Stay
Admit: 2018-04-13 | Discharge: 2018-04-13 | Disposition: A | Discharge status: Home / Self Care | Payer: Medicaid Other | Attending: Internal Medicine | Admitting: Internal Medicine

## 2018-04-13 LAB — BASIC METABOLIC PANEL
Anion gap: 9 (ref 5–15)
BUN: 44 mg/dL — AB (ref 6–20)
CO2: 26 mmol/L (ref 22–32)
CREATININE: 0.79 mg/dL (ref 0.44–1.00)
Calcium: 9 mg/dL (ref 8.9–10.3)
Chloride: 107 mmol/L (ref 98–111)
GFR calc Af Amer: >60 mL/min (ref >60)
GFR calc non Af Amer: >60 mL/min (ref >60)
GLUCOSE: 131 mg/dL — AB (ref 70–99)
Potassium: 3.7 mmol/L (ref 3.5–5.1)
Sodium: 142 mmol/L (ref 135–145)

## 2018-04-13 LAB — CBC WITH DIFFERENTIAL/PLATELET
Basophils Absolute: 0 10*3/uL (ref 0–0.1)
Basophils Relative: 0 %
EOS ABS: 0 10*3/uL (ref 0–0.7)
EOS PCT: 0 %
HCT: 34.9 % — ABNORMAL LOW (ref 35.0–47.0)
Hemoglobin: 12 g/dL (ref 12.0–16.0)
LYMPHS ABS: 0.8 10*3/uL — AB (ref 1.0–3.6)
Lymphocytes Relative: 10 %
MCH: 30.6 pg (ref 26.0–34.0)
MCHC: 34.4 g/dL (ref 32.0–36.0)
MCV: 88.9 fL (ref 80.0–100.0)
MONO ABS: 0.4 10*3/uL (ref 0.2–0.9)
MONOS PCT: 4 %
Neutro Abs: 7.5 10*3/uL — ABNORMAL HIGH (ref 1.4–6.5)
Neutrophils Relative %: 86 %
Platelets: 230 10*3/uL (ref 150–440)
RBC: 3.93 MIL/uL (ref 3.80–5.20)
RDW: 13.4 % (ref 11.5–14.5)
WBC: 8.7 10*3/uL (ref 3.6–11.0)

## 2018-04-13 LAB — PHOSPHORUS: Phosphorus: 3.9 mg/dL (ref 2.5–4.6)

## 2018-04-13 LAB — HIV ANTIBODY (ROUTINE TESTING W REFLEX): HIV Screen 4th Generation wRfx: NONREACTIVE

## 2018-04-13 LAB — ECHOCARDIOGRAM COMPLETE
Height: 57 in
Weight: 2197.55 oz

## 2018-04-13 LAB — TSH: TSH: 0.316 u[IU]/mL — AB (ref 0.350–4.500)

## 2018-04-13 MED ORDER — ALPRAZOLAM 0.5 MG PO TABS
0.5000 mg | ORAL_TABLET | Freq: Three times a day (TID) | ORAL | Status: DC
  Administered 2018-04-13 – 2018-04-18 (×15): 0.5 mg via ORAL
  Filled 2018-04-13 (×15): qty 1

## 2018-04-13 MED ORDER — FUROSEMIDE 10 MG/ML IJ SOLN
40.0000 mg | Freq: Once | INTRAMUSCULAR | Status: AC
  Administered 2018-04-13: 40 mg via INTRAVENOUS
  Filled 2018-04-13: qty 4

## 2018-04-13 NOTE — Progress Notes (Signed)
Follow up - Critical Care Medicine Note  Patient Details:    Elizabeth [name] is an 57 y.o. female.57 y.o. female with a known history of COPD, congestive heart failure, GERD, anxiety disorder presented to the emergency room for shortness of breath for the last 3 days.  Was started on BiPAP and moved to the intensive care unit  Lines, Airways, Drains: External Urinary Catheter (Active)  Collection Container Dedicated Suction Canister 04/12/2018  1:00 AM  Securement Method Other (Comment) 04/12/2018  1:00 AM  Output (mL) 1150 mL 04/11/2018  7:45 PM    Anti-infectives:  Anti-infectives (From admission, onward)   Start     Dose/Rate Route Frequency Ordered Stop   04/12/18 1400  azithromycin (ZITHROMAX) 500 mg in sodium chloride 0.9 % 250 mL IVPB     500 mg 250 mL/hr over 60 Minutes Intravenous Every 24 hours 04/11/18 1620     04/11/18 1800  cefTRIAXone (ROCEPHIN) 1 g in sodium chloride 0.9 % 100 mL IVPB     1 g 200 mL/hr over 30 Minutes Intravenous Every 24 hours 04/11/18 1619     04/11/18 1600  azithromycin (ZITHROMAX) 500 mg in sodium chloride 0.9 % 250 mL IVPB     500 mg 250 mL/hr over 60 Minutes Intravenous  Once 04/11/18 1555 04/11/18 1755      Microbiology: Results for orders placed or performed during the hospital encounter of 04/11/18  MRSA PCR Screening     Status: None   Collection Time: 04/11/18  5:30 PM  Result Value Ref Range Status   MRSA by PCR NEGATIVE NEGATIVE Final    Comment:        The GeneXpert MRSA Assay (FDA approved for NASAL specimens only), is one component of a comprehensive MRSA colonization surveillance program. It is not intended to diagnose MRSA infection nor to guide or monitor treatment for MRSA infections. Performed at [facility], [address] [place], [place] [address]    Studies: Dg Chest Portable 1 View  Result Date: 04/11/2018 CLINICAL DATA:  Dyspnea EXAM: PORTABLE CHEST 1 VIEW COMPARISON:  11/23/2017 FINDINGS: There is no  focal parenchymal opacity. There is no pleural effusion or pneumothorax. The heart and mediastinal contours are unremarkable. There is a spinal stimulator noted. The osseous structures are unremarkable. IMPRESSION: No active disease. Electronically Signed   By: Elige Ko   On: 04/11/2018 15:29    Subjective:    Overnight Issues: Patient's respiratory status has improved from yesterday, has done well with Precedex infusion for anxiety  Objective:  Vital signs for last 24 hours: Temp:  [97 F (36.1 C)-98.7 F (37.1 C)] 98.5 F (36.9 C) (07/03 0100) Pulse Rate:  [79-131] 79 (07/03 0700) Resp:  [15-37] 26 (07/03 0700) BP: (93-161)/(54-122) 101/64 (07/03 0700) SpO2:  [88 %-97 %] 97 % (07/03 0700) FiO2 (%):  [35 %] 35 % (07/03 0440)  Hemodynamic parameters for last 24 hours:    Intake/Output from previous day: 07/02 0701 - 07/03 0700 In: 892.9 [I.V.:439.5; IV Piggyback:453.3] Out: 650 [Urine:650]  Intake/Output this shift: No intake/output data recorded.  Vent settings for last 24 hours: FiO2 (%):  [35 %] 35 %  Physical Exam:  GENERAL:  57 y.o.-year-old patient lying in the bed + acute distress.  HEENT: Head atraumatic, normocephalic. Oropharynx and nasopharynx clear.  LUNGS: Decreased breath sounds bilaterally, wheezing has improved  CARDIOVASCULAR: regular rhythm distant heart sounds ABDOMEN: Soft, nontender, nondistended. Bowel sounds present. No organomegaly or mass.  EXTREMITIES: No pedal edema,  cyanosis, or clubbing.  NEUROLOGIC: Cranial nerves II through XII are intact. Muscle strength 5/5 in all extremities.   Assessment/Plan:   Patient with severe COPD and respiratory failure. Presently on azithromycin, Rocephin, Solu-Medrol, albuterol, Atrovent. She is presently on BiPAP and Precedex. Respiratory status has improved from yesterday, will attempt to wean Precedex and use Benzodiazepine as needed for anxiety  Superimposed CHF. Responded well to diuretics with 1.1 L  out. Pending echocardiogram  Prerenal indices. Will give additional dose of diuretics   Elizabeth [name] 04/13/2018  *Care during the described time interval was provided by me and/or other providers on the critical care team.  I have reviewed this patient's available data, including medical history, events of note, physical examination and test results as part of my evaluation. [name], female   DOB: 1961/06/20, 57 y.o.   MRN: [id number]

## 2018-04-13 NOTE — Progress Notes (Signed)
*  PRELIMINARY RESULTS* Echocardiogram 2D Echocardiogram has been performed.  Joanette GulaJoan M Oscar Forman 04/13/2018, 11:31 AM

## 2018-04-13 NOTE — Plan of Care (Signed)
  Problem: Nutrition: Goal: Adequate nutrition will be maintained Outcome: Progressing  Patient started on diet today; tolerating well.  Problem: Coping: Goal: Level of anxiety will decrease Outcome: Progressing  Patient started on PO xanax TID; Precedex drip weaned off  Problem: Elimination: Goal: Will not experience complications related to urinary retention Outcome: Progressing  Patient given IV lasix with adequate output  Problem: Respiratory: Goal: Ability to maintain a clear airway will improve Outcome: Progressing Goal: Levels of oxygenation will improve Outcome: Progressing Goal: Ability to maintain adequate ventilation will improve Outcome: Progressing   Patient tolerating Bipap at night and 6LNC during the day with improved O2 sats

## 2018-04-13 NOTE — Progress Notes (Signed)
[facility] [place] [facility]   PATIENT NAME: Elizabeth [name]    MR#:  [id number]  DATE OF BIRTH:  1961/09/03  SUBJECTIVE:   Patient getting breathing treatment. She is anxious to get on breed. Sats are 96% on 6 L nasal cannula. She is a bit tearful. REVIEW OF SYSTEMS:   Review of Systems  Constitutional: Negative for chills, fever and weight loss.  HENT: Negative for ear discharge, ear pain and nosebleeds.   Eyes: Negative for blurred vision, pain and discharge.  Respiratory: Positive for cough and shortness of breath. Negative for sputum production, wheezing and stridor.   Cardiovascular: Negative for chest pain, palpitations, orthopnea and PND.  Gastrointestinal: Negative for abdominal pain, diarrhea, nausea and vomiting.  Genitourinary: Negative for frequency and urgency.  Musculoskeletal: Negative for back pain and joint pain.  Neurological: Negative for sensory change, speech change, focal weakness and weakness.  Psychiatric/Behavioral: Negative for depression and hallucinations. The patient is nervous/anxious.    Tolerating Diet:yes Tolerating PT: pending  DRUG ALLERGIES:   Allergies  Allergen Reactions  . Lisinopril Anaphylaxis    VITALS:  Blood pressure (!) 109/57, pulse 96, temperature 98.4 F (36.9 C), temperature source Axillary, resp. rate (!) 26, height 4\' 9"  (1.448 m), weight 62.3 kg (137 lb 5.6 oz), SpO2 90 %.  PHYSICAL EXAMINATION:   Physical Exam  GENERAL:  57 y.o.-year-old patient lying in the bed with no acute distress.  EYES: Pupils equal, round, reactive to light and accommodation. No scleral icterus. Extraocular muscles intact.  HEENT: Head atraumatic, normocephalic. Oropharynx and nasopharynx clear.  NECK:  Supple, no jugular venous distention. No thyroid enlargement, no tenderness.  LUNGS; distant breath sounds bilaterally, no wheezing, rales, rhonchi. No use of accessory muscles of respiration.  CARDIOVASCULAR: S1, S2  normal. No murmurs, rubs, or gallops.  ABDOMEN: Soft, nontender, nondistended. Bowel sounds present. No organomegaly or mass.  EXTREMITIES: No cyanosis, clubbing or edema b/l.    NEUROLOGIC: Cranial nerves II through XII are intact. No focal Motor or sensory deficits b/l.   PSYCHIATRIC:  patient is alert and oriented x 3. anxious SKIN: No obvious rash, lesion, or ulcer.   LABORATORY PANEL:  CBC Recent Labs  Lab 04/13/18 0518  WBC 8.7  HGB 12.0  HCT 34.9*  PLT 230    Chemistries  Recent Labs  Lab 04/11/18 1509 04/12/18 0718 04/13/18 0518  NA 142 143 142  K 3.9 4.2 3.7  CL 109 105 107  CO2 21* 25 26  GLUCOSE 100* 128* 131*  BUN 20 27* 44*  CREATININE 0.85 0.84 0.79  CALCIUM 9.2 9.0 9.0  MG  --  2.5*  --   AST 27  --   --   ALT 17  --   --   ALKPHOS 132*  --   --   BILITOT 0.8  --   --    Cardiac Enzymes Recent Labs  Lab 04/11/18 1509  TROPONINI <0.03   RADIOLOGY:  Dg Chest Portable 1 View  Result Date: 04/11/2018 CLINICAL DATA:  Dyspnea EXAM: PORTABLE CHEST 1 VIEW COMPARISON:  11/23/2017 FINDINGS: There is no focal parenchymal opacity. There is no pleural effusion or pneumothorax. The heart and mediastinal contours are unremarkable. There is a spinal stimulator noted. The osseous structures are unremarkable. IMPRESSION: No active disease. Electronically Signed   By: Elige Ko   On: 04/11/2018 15:29   ASSESSMENT AND PLAN:  57 year old female patient with history of COPD, congestive heart failure, GERD, anxiety  disorder presented to the emergency room for shortness of breath and cough.  -Acute respiratory failure hypercapnic and hypoxic due to COPD exacerbation and CHF exacerbation Continue BiPAP prn, wean off as tolerated  Intensivist following -now on 6 L nasal cannula oxygen  -Acute COPD exacerbation IV Solu-Medrol 40 MG DQ 12 hourly Aggressive nebulization treatment  IV Rocephin and Zithromax antibiotics  -GERD continue proton pump  inhibitor  -Acute on chronic congestive heart failure Continue oral Lasix for diuresis Monitor intake and output and daily weights  -Tobacco cessation counseled to the patient for 6 minutes Nicotine patch offered  -DVT prophylaxis subcu Lovenox daily  Case discussed with Care Management/Social Worker. Management plans discussed with the patient, family and they are in agreement.  CODE STATUS: full  DVT Prophylaxis: lovenox  TOTAL TIME TAKING CARE OF THIS PATIENT: *30* minutes.  >50% time spent on counselling and coordination of care  POSSIBLE D/C IN *2-3* DAYS, DEPENDING ON CLINICAL CONDITION.  Note: This dictation was prepared with Dragon dictation along with smaller phrase technology. Any transcriptional errors that result from this process are unintentional.  Enedina FinnerSona Rhian Asebedo M.D on 04/13/2018 at 1:53 PM  Between 7am to 6pm - Pager - [phone]  After 6pm go to [email or web address] - Social research officer, governmentpassword EPAS [email or web address]  [facility]  Office  [phone]  CC: Primary care physician; [facility]: Elizabeth SaltRita L [name], female   DOB: 12/19/1960, 57 y.o.   MRN: [id number]

## 2018-04-13 NOTE — Progress Notes (Signed)
   04/13/18 1035  Clinical Encounter Type  Visited With Patient  Visit Type Initial  Spiritual Encounters  Spiritual Needs Prayer   Chaplain attempted introductory visit; patient did not wake to chaplain's voice.  Chaplain offered silent and energetic prayers for patient and care team.

## 2018-04-14 MED ORDER — AZITHROMYCIN 250 MG PO TABS
250.0000 mg | ORAL_TABLET | Freq: Every day | ORAL | Status: AC
  Administered 2018-04-14 – 2018-04-15 (×2): 250 mg via ORAL
  Filled 2018-04-14 (×2): qty 1

## 2018-04-14 MED ORDER — MORPHINE SULFATE (PF) 2 MG/ML IV SOLN
2.0000 mg | Freq: Once | INTRAVENOUS | Status: AC
  Administered 2018-04-14: 2 mg via INTRAVENOUS

## 2018-04-14 NOTE — Progress Notes (Signed)
[facility] - [place] at [facility]   PATIENT NAME: Elizabeth [name]    MR#:  [id number]  DATE OF BIRTH:  1960-12-09  SUBJECTIVE:   Overall improving however very anxious. Some expiratory wheezing. REVIEW OF SYSTEMS:   Review of Systems  Constitutional: Negative for chills, fever and weight loss.  HENT: Negative for ear discharge, ear pain and nosebleeds.   Eyes: Negative for blurred vision, pain and discharge.  Respiratory: Positive for cough and shortness of breath. Negative for sputum production, wheezing and stridor.   Cardiovascular: Negative for chest pain, palpitations, orthopnea and PND.  Gastrointestinal: Negative for abdominal pain, diarrhea, nausea and vomiting.  Genitourinary: Negative for frequency and urgency.  Musculoskeletal: Negative for back pain and joint pain.  Neurological: Negative for sensory change, speech change, focal weakness and weakness.  Psychiatric/Behavioral: Negative for depression and hallucinations. The patient is nervous/anxious.    Tolerating Diet:yes Tolerating PT: pending  DRUG ALLERGIES:   Allergies  Allergen Reactions  . Lisinopril Anaphylaxis    VITALS:  Blood pressure 102/69, pulse 84, temperature 97.7 F (36.5 C), temperature source Oral, resp. rate (!) 22, height 4\' 9"  (1.448 m), weight 62.3 kg (137 lb 5.6 oz), SpO2 99 %.  PHYSICAL EXAMINATION:   Physical Exam  GENERAL:  57 y.o.-year-old patient lying in the bed with no acute distress.  EYES: Pupils equal, round, reactive to light and accommodation. No scleral icterus. Extraocular muscles intact.  HEENT: Head atraumatic, normocephalic. Oropharynx and nasopharynx clear.  NECK:  Supple, no jugular venous distention. No thyroid enlargement, no tenderness.  LUNGS; distant breath sounds bilaterally, no wheezing, rales, rhonchi. No use of accessory muscles of respiration.  CARDIOVASCULAR: S1, S2 normal. No murmurs, rubs, or gallops.  ABDOMEN: Soft, nontender,  nondistended. Bowel sounds present. No organomegaly or mass.  EXTREMITIES: No cyanosis, clubbing or edema b/l.    NEUROLOGIC: Cranial nerves II through XII are intact. No focal Motor or sensory deficits b/l.   PSYCHIATRIC:  patient is alert and oriented x 3. anxious SKIN: No obvious rash, lesion, or ulcer.   LABORATORY PANEL:  CBC Recent Labs  Lab 04/13/18 0518  WBC 8.7  HGB 12.0  HCT 34.9*  PLT 230    Chemistries  Recent Labs  Lab 04/11/18 1509 04/12/18 0718 04/13/18 0518  NA 142 143 142  K 3.9 4.2 3.7  CL 109 105 107  CO2 21* 25 26  GLUCOSE 100* 128* 131*  BUN 20 27* 44*  CREATININE 0.85 0.84 0.79  CALCIUM 9.2 9.0 9.0  MG  --  2.5*  --   AST 27  --   --   ALT 17  --   --   ALKPHOS 132*  --   --   BILITOT 0.8  --   --    Cardiac Enzymes Recent Labs  Lab 04/11/18 1509  TROPONINI <0.03   RADIOLOGY:  No results found. ASSESSMENT AND PLAN:  57 year old female patient with history of COPD, congestive heart failure, GERD, anxiety disorder presented to the emergency room for shortness of breath and cough.  -Acute respiratory failure hypercapnic and hypoxic due to COPD exacerbation and CHF exacerbation Continue BiPAP prn, wean off as tolerated  Intensivist following -now on 6 L nasal cannula oxygen  -Acute COPD exacerbation IV Solu-Medrol 40 MG DQ 12 hourly Aggressive nebulization treatment  IV Rocephin and Zithromax antibiotics  -GERD continue proton pump inhibitor  -Acute on chronic congestive heart failure Continue oral Lasix for diuresis Monitor intake and output and  daily weights  -Tobacco cessation counseled to the patient for 6 minutes Nicotine patch offered  -DVT prophylaxis subcu Lovenox daily  Case discussed with Care Management/Social Worker. Management plans discussed with the patient, family and they are in agreement.  CODE STATUS: full  DVT Prophylaxis: lovenox  TOTAL TIME TAKING CARE OF THIS PATIENT: *30* minutes.  >50% time  spent on counselling and coordination of care  POSSIBLE D/C IN *2-3* DAYS, DEPENDING ON CLINICAL CONDITION.  Note: This dictation was prepared with Dragon dictation along with smaller phrase technology. Any transcriptional errors that result from this process are unintentional.  Enedina FinnerSona Jaquarius Seder M.D on 04/14/2018 at 12:56 PM  Between 7am to 6pm - Pager - [phone]  After 6pm go to [email or web address] - Social research officer, governmentpassword [email or web address] [email or web address]  [facility] [place]  Office  [phone]  CC: Primary care physician; Center, [facility] ID: Elizabeth SaltRita L [name], female   DOB: [birth year], 57 y.o.   MRN: [id number]

## 2018-04-14 NOTE — Progress Notes (Signed)
Follow up - Critical Care Medicine Note  Patient Details:    Elizabeth [name] is an 57 y.o. female.57 y.o. female with a known history of COPD, congestive heart failure, GERD, anxiety disorder presented to the emergency room for shortness of breath for the last 3 days.  Was started on BiPAP and moved to the intensive care unit  Lines, Airways, Drains: External Urinary Catheter (Active)  Collection Container Dedicated Suction Canister 04/12/2018  1:00 AM  Securement Method Other (Comment) 04/12/2018  1:00 AM  Output (mL) 1150 mL 04/11/2018  7:45 PM    Anti-infectives:  Anti-infectives (From admission, onward)   Start     Dose/Rate Route Frequency Ordered Stop   04/12/18 1400  azithromycin (ZITHROMAX) 500 mg in sodium chloride 0.9 % 250 mL IVPB     500 mg 250 mL/hr over 60 Minutes Intravenous Every 24 hours 04/11/18 1620     04/11/18 1800  cefTRIAXone (ROCEPHIN) 1 g in sodium chloride 0.9 % 100 mL IVPB     1 g 200 mL/hr over 30 Minutes Intravenous Every 24 hours 04/11/18 1619     04/11/18 1600  azithromycin (ZITHROMAX) 500 mg in sodium chloride 0.9 % 250 mL IVPB     500 mg 250 mL/hr over 60 Minutes Intravenous  Once 04/11/18 1555 04/11/18 1755      Microbiology: Results for orders placed or performed during the hospital encounter of 04/11/18  MRSA PCR Screening     Status: None   Collection Time: 04/11/18  5:30 PM  Result Value Ref Range Status   MRSA by PCR NEGATIVE NEGATIVE Final    Comment:        The GeneXpert MRSA Assay (FDA approved for NASAL specimens only), is one component of a comprehensive MRSA colonization surveillance program. It is not intended to diagnose MRSA infection nor to guide or monitor treatment for MRSA infections. Performed at [facility], [address] [place], [place] [address]    Studies: Dg Chest Portable 1 View  Result Date: 04/11/2018 CLINICAL DATA:  Dyspnea EXAM: PORTABLE CHEST 1 VIEW COMPARISON:  11/23/2017 FINDINGS: There is no  focal parenchymal opacity. There is no pleural effusion or pneumothorax. The heart and mediastinal contours are unremarkable. There is a spinal stimulator noted. The osseous structures are unremarkable. IMPRESSION: No active disease. Electronically Signed   By: Elige KoHetal  Patel   On: 04/11/2018 15:29    Subjective:    Overnight Issues: patient's respiratory status has improved. Weaned off of Precedex, on scheduled Xanax, used BiPAP for 4 hours last evening  Objective:  Vital signs for last 24 hours: Temp:  [97.6 F (36.4 C)-98.7 F (37.1 C)] 97.7 F (36.5 C) (07/04 0740) Pulse Rate:  [76-123] 93 (07/04 0740) Resp:  [13-36] 28 (07/04 0740) BP: (93-141)/(55-109) 111/63 (07/04 0700) SpO2:  [81 %-100 %] 92 % (07/04 0810) FiO2 (%):  [35 %-44 %] 44 % (07/04 0810)  Hemodynamic parameters for last 24 hours:    Intake/Output from previous day: 07/03 0701 - 07/04 0700 In: 425 [I.V.:175; IV Piggyback:250] Out: 1400 [Urine:1400]  Intake/Output this shift: No intake/output data recorded.  Vent settings for last 24 hours: FiO2 (%):  [35 %-44 %] 44 %  Physical Exam:  GENERAL:  patient awake, alert, still in some respiratory distress but significantly improved HEENT: Head atraumatic, normocephalic. Oropharynx and nasopharynx clear.  LUNGS: Decreased breath sounds bilaterally, wheezing has improved  CARDIOVASCULAR: regular rhythm distant heart sounds ABDOMEN: Soft, nontender, nondistended. Bowel sounds present. No organomegaly or  mass.  EXTREMITIES: No pedal edema, cyanosis, or clubbing.  NEUROLOGIC: Cranial nerves II through XII are intact. Muscle strength 5/5 in all extremities.   Assessment/Plan:   Patient with severe COPD and respiratory failure. Presently on azithromycin, Rocephin, Solu-Medrol, albuterol, Atrovent. Has improved  Anxiety. Patient has been weaned off a Precedex on scheduled Xanax  Superimposed CHF. Responded well to diuretics with 1.1 L out. Pending  echocardiogram  Prerenal indices. Diuresed 0.8 L yesterday continues on Lasix  Elizabeth [name] 04/14/2018  *Care during the described time interval was provided by me and/or other providers on the critical care team.  I have reviewed this patient's available data, including medical history, events of note, physical examination and test results as part of my evaluation. [name], female   DOB: [birth year], 57 y.o.   MRN: [id number] [name], female   DOB: [birth year], 57 y.o.   MRN: [id number]

## 2018-04-14 NOTE — Progress Notes (Signed)
[name], female   DOB: October 29, 1960, 57 y.o.   MRN: [id number] Pulmonary/critical care attending  Patient with increasing agitation, increasing work of breathing. She is tearful. Placed back on Precedex drip and started back on BiPAP. She is now resting comfortably with stable work of breathing and oxygenation  Tora KindredJohn Emileigh Kellett, D.O.

## 2018-04-14 NOTE — Progress Notes (Signed)
Patient was receiving treatment, so Chaplain prayed silently outside of the room. As the patient continued coughing, Chaplain returned to pray energetically.

## 2018-04-15 DIAGNOSIS — R0603 Acute respiratory distress: Secondary | ICD-10-CM

## 2018-04-15 DIAGNOSIS — I509 Heart failure, unspecified: Secondary | ICD-10-CM

## 2018-04-15 DIAGNOSIS — Z7189 Other specified counseling: Secondary | ICD-10-CM

## 2018-04-15 DIAGNOSIS — J441 Chronic obstructive pulmonary disease with (acute) exacerbation: Secondary | ICD-10-CM

## 2018-04-15 DIAGNOSIS — Z515 Encounter for palliative care: Secondary | ICD-10-CM

## 2018-04-15 LAB — T4, FREE: FREE T4: 0.6 ng/dL — AB (ref 0.82–1.77)

## 2018-04-15 MED ORDER — SENNOSIDES-DOCUSATE SODIUM 8.6-50 MG PO TABS
2.0000 | ORAL_TABLET | Freq: Two times a day (BID) | ORAL | Status: DC
  Administered 2018-04-15 – 2018-04-18 (×6): 2 via ORAL
  Filled 2018-04-15 (×6): qty 2

## 2018-04-15 MED ORDER — FUROSEMIDE 10 MG/ML IJ SOLN
20.0000 mg | Freq: Two times a day (BID) | INTRAMUSCULAR | Status: DC
  Administered 2018-04-15 – 2018-04-17 (×5): 20 mg via INTRAVENOUS
  Filled 2018-04-15 (×5): qty 2

## 2018-04-15 NOTE — Progress Notes (Signed)
Pharmacy Electrolyte Monitoring Consult:  Pharmacy consulted to assist in monitoring and replacing electrolytes in this 57 y.o. female admitted on 04/11/2018 with Shortness of Breath   Labs:  Sodium (mmol/L)  Date Value  04/13/2018 142  08/30/2017 144  03/08/2014 139   Potassium (mmol/L)  Date Value  04/13/2018 3.7  03/08/2014 4.0   Magnesium (mg/dL)  Date Value  09/81/191407/11/2017 2.5 (H)  03/09/2014 2.2   Phosphorus (mg/dL)  Date Value  78/29/562107/12/2017 3.9   Calcium (mg/dL)  Date Value  30/86/578407/12/2017 9.0   Calcium, Total (mg/dL)  Date Value  69/62/952805/28/2015 8.9   Albumin (g/dL)  Date Value  41/32/440107/10/2017 4.3  08/30/2017 4.3  03/08/2014 3.1 (L)    Assessment/Plan: Patient's furosemide 20mg  oral transitioned to 20mg  IV Q12hr on am ICU rounds. Will obtain follow up electrolytes with am labs. Will need to replace orally as tolerated.   Pharmacy will continue to monitor and adjust per consult.   Simpson,Michael L 04/15/2018 10:47 PM

## 2018-04-15 NOTE — Progress Notes (Signed)
[facility] [place] [facility]   PATIENT NAME: Elizabeth [name]    MR#:  [id number]  DATE OF BIRTH:  1961-07-08  SUBJECTIVE:   Overall  very anxious. Some expiratory wheezing. Patient currently is on BiPAP. REVIEW OF SYSTEMS:   Review of Systems  Constitutional: Negative for chills, fever and weight loss.  HENT: Negative for ear discharge, ear pain and nosebleeds.   Eyes: Negative for blurred vision, pain and discharge.  Respiratory: Positive for cough and shortness of breath. Negative for sputum production, wheezing and stridor.   Cardiovascular: Negative for chest pain, palpitations, orthopnea and PND.  Gastrointestinal: Negative for abdominal pain, diarrhea, nausea and vomiting.  Genitourinary: Negative for frequency and urgency.  Musculoskeletal: Negative for back pain and joint pain.  Neurological: Negative for sensory change, speech change, focal weakness and weakness.  Psychiatric/Behavioral: Negative for depression and hallucinations. The patient is nervous/anxious.    Tolerating Diet:yes Tolerating PT: pending  DRUG ALLERGIES:   Allergies  Allergen Reactions  . Lisinopril Anaphylaxis    VITALS:  Blood pressure 123/72, pulse 78, temperature (!) 97.4 F (36.3 C), temperature source Axillary, resp. rate 18, height 4\' 9"  (1.448 m), weight 62.3 kg (137 lb 5.6 oz), SpO2 94 %.  PHYSICAL EXAMINATION:   Physical Exam  GENERAL:  57 y.o.-year-old patient lying in the bed with no acute distress.  EYES: Pupils equal, round, reactive to light and accommodation. No scleral icterus. Extraocular muscles intact.  HEENT: Head atraumatic, normocephalic. Oropharynx and nasopharynx clear. BIPAP NECK:  Supple, no jugular venous distention. No thyroid enlargement, no tenderness.  LUNGS; distant breath sounds bilaterally, no wheezing, rales, rhonchi. No use of accessory muscles of respiration.  CARDIOVASCULAR: S1, S2 normal. No murmurs, rubs, or gallops.   ABDOMEN: Soft, nontender, nondistended. Bowel sounds present. No organomegaly or mass.  EXTREMITIES: No cyanosis, clubbing or edema b/l.    NEUROLOGIC: Cranial nerves II through XII are intact. No focal Motor or sensory deficits b/l.   PSYCHIATRIC:  patient is alert and oriented x 3. anxious SKIN: No obvious rash, lesion, or ulcer.   LABORATORY PANEL:  CBC Recent Labs  Lab 04/13/18 0518  WBC 8.7  HGB 12.0  HCT 34.9*  PLT 230    Chemistries  Recent Labs  Lab 04/11/18 1509 04/12/18 0718 04/13/18 0518  NA 142 143 142  K 3.9 4.2 3.7  CL 109 105 107  CO2 21* 25 26  GLUCOSE 100* 128* 131*  BUN 20 27* 44*  CREATININE 0.85 0.84 0.79  CALCIUM 9.2 9.0 9.0  MG  --  2.5*  --   AST 27  --   --   ALT 17  --   --   ALKPHOS 132*  --   --   BILITOT 0.8  --   --    Cardiac Enzymes Recent Labs  Lab 04/11/18 1509  TROPONINI <0.03   RADIOLOGY:  No results found. ASSESSMENT AND PLAN:  57 year old female patient with history of COPD, congestive heart failure, GERD, anxiety disorder presented to the emergency room for shortness of breath and cough.  -Acute respiratory failure hypercapnic and hypoxic due to COPD exacerbation and CHF exacerbation Intensivist following-- per Dr Belia Hemankasa if pt does not improve may consider intubation.  -now on BIPAP -patient has issues with anxiety.  -Palliative care consulted   -Acute COPD exacerbation IV Solu-Medrol 40 MG DQ 12 hourly Aggressive nebulization treatment po Zithromax antibiotics  -GERD continue proton pump inhibitor  -Acute on chronic congestive heart  failure Continue oral Lasix for diuresis Monitor intake and output and daily weights  -Tobacco cessation counseled to the patient for 6 minutes Nicotine patch offered  -DVT prophylaxis subcu Lovenox daily  Case discussed with Care Management/Social Worker. Management plans discussed with the patient, family and they are in agreement.  CODE STATUS: full  DVT Prophylaxis:  lovenox  TOTAL TIME TAKING CARE OF THIS PATIENT: *30* minutes.  >50% time spent on counselling and coordination of care  POSSIBLE D/C IN *??* DAYS, DEPENDING ON CLINICAL CONDITION.  Note: This dictation was prepared with Dragon dictation along with smaller phrase technology. Any transcriptional errors that result from this process are unintentional.  Enedina Finner M.D on 04/15/2018 at 11:54 AM  Between 7am to 6pm - Pager - [phone]  After 6pm go to [email or web address] - Social research officer, government  [facility]  Office  [phone]  CC: Primary care physician; [facility] ID: Elizabeth [name], female   DOB: December 14, 1960, 57 y.o.   MRN: [id number]

## 2018-04-15 NOTE — Consult Note (Signed)
                                                                                 Consultation Note Date: 04/15/2018   Patient Name: Elizabeth [name]  DOB: 09/09/1961  MRN: [id number]  Age / Sex: 57 y.o., female  PCP: [facility] Referring Physician: Patel, Sona, MD  Reason for Consultation: Establishing goals of care  HPI/Patient Profile: 57 y.o. female  with past medical history of COPD, CHF, anxiety, depression, reflex sympathetic dystrophy, spinal cord stimulator status, vitamin D deficiency, and vitamin B12 deficiency admitted on 04/11/2018 with shortness of breath and cough. BiPAP, nebs and steroids initiated in ED. Chest xray negative for acute findings. Acute on chronic congestive heart failure. Receiving lasix BID. Palliative medicine consultation for goals of care.   Clinical Assessment and Goals of Care:  I have reviewed medical records, discussed with care team, and met with patient at bedside to discuss diagnosis, GOC, EOL wishes, disposition and options.  Introduced Palliative Medicine as specialized medical care for people living with serious illness. It focuses on providing relief from the symptoms and stress of a serious illness. The goal is to improve quality of life for both the patient and the family.  We discussed a brief life review of the patient. Divorced. She has two sons, who live in [place], [place]. Lives with boyfriend, Tony. ]  I explored how long she has had COPD. Orville tells me this is the first she heard of COPD and CHF. Sylver was admitted in February for similar symptoms but tells me that was an upper respiratory infection.   I explained the severity of this hospitalization including diagnoses and interventions. Educated on chronic, irreversible nature of both COPD and CHF. Explained concern with BiPAP being step before mechanical ventilation/life support. She asks how long she would have to be on life-support machine. I explained concern with  inability to wean from ventilator with underlying COPD and CHF and this leading to further aggressive interventions (trach) if she were unable to wean from ventilator.    She does share that she felt scared and as if "I was going to die" when she heard Dr. Kasa explain she may have to be placed on life support.   Attempted to explore her thoughts on heroic interventions including resuscitation and life support. Lynnelle is overwhelmed with the thought of making these big decisions. She does not yet have a documented living will or HCPOA. She cannot yet make a decisions regarding resuscitation/life support.   I encouraged Loneta to consider who she trusts to make decisions for her if she was unable to make decisions for herself. Explained that sons are automatically decision-makers if she was unable to make medical decisions. Encouraged Skiler to read Hard Choices and MOST form and consider big decisions she is faced with with chronic, progressive disease.   Onita is agreeable with me adding her sons' contact information to chart.   Reonna tells me she feels "much better" today and has been able to stay off BiPAP all afternoon. She is hopeful to show improvement and be weaned from oxygen. She shares that her mother-in-law has lived with COPD   for 30+ years. She is ready to get out of bed and work with therapy when possible.   Questions and concerns were addressed. Emotional support provided.    SUMMARY OF RECOMMENDATIONS    Initial palliative discussion.   Continue FULL code/FULL scope treatment. Patient overwhelmed with thought of making big decisions regarding heroic interventions.   Hard Choices and MOST form left for review. Encouraged patient to consider completing AD packet and documenting trusting decision-maker as HCPOA.   Watchful waiting. Patient hopeful for improvement and eager to get out of bed.   Palliative will follow clinical progression. PMT not at [facility] over the weekend but will f/u next  week and further discuss [place].   Code Status/Advance Care Planning:  Full code  Symptom Management:   Per attending  Palliative Prophylaxis:   Aspiration, Delirium Protocol, Frequent Pain Assessment and Oral Care  Additional Recommendations (Limitations, Scope, Preferences):  Full Scope Treatment  Psycho-social/Spiritual:   Desire for further Chaplaincy support:yes  Additional Recommendations: Caregiving  Support/Resources  Prognosis:   Unable to determine: guarded with acute on chronic respiratory failure secondary to COPD and CHF exacerbations. High risk for decompensation.   Discharge Planning: To Be Determined      Primary Diagnoses: Present on Admission: . Respiratory failure ([place])   I have reviewed the medical record, interviewed the patient and family, and examined the patient. The following aspects are pertinent.  Past Medical History:  Diagnosis Date  . Anxiety   . Arm pain 07/25/2015  . CHF (congestive heart failure) ([place])   . Congestive heart failure ([place]) 07/25/2015  . COPD (chronic obstructive pulmonary disease) ([place])    never been idagnosed  . Depression   . Lower extremity pain 07/25/2015  . Reflux   . RSD (reflex sympathetic dystrophy)   . Spinal cord stimulator status   . Vitamin B12 deficiency   . Vitamin D deficiency    Social History   Socioeconomic History  . Marital status: Divorced    Spouse name: Not on file  . Number of children: Not on file  . Years of education: Not on file  . Highest education level: Not on file  Occupational History  . Not on file  Social Needs  . Financial resource strain: Not on file  . Food insecurity:    Worry: Not on file    Inability: Not on file  . Transportation needs:    Medical: Not on file    Non-medical: Not on file  Tobacco Use  . Smoking status: Current Every Day Smoker    Packs/day: 0.50    Types: Cigarettes  . Smokeless tobacco: Never Used  . Tobacco comment: Quit 2 years ago    Substance and Sexual Activity  . Alcohol use: No    Alcohol/week: 0.0 oz  . Drug use: No  . Sexual activity: Not on file  Lifestyle  . Physical activity:    Days per week: Not on file    Minutes per session: Not on file  . Stress: Not on file  Relationships  . Social connections:    Talks on phone: Not on file    Gets together: Not on file    Attends religious service: Not on file    Active member of club or organization: Not on file    Attends meetings of clubs or organizations: Not on file    Relationship status: Not on file  Other Topics Concern  . Not on file  Social History Narrative  .  Not on file   Family History  Problem Relation Age of Onset  . Cirrhosis Mother   . Cancer Father    Scheduled Meds: . ALPRAZolam  0.5 mg Oral TID  . atorvastatin  40 mg Oral QHS  . benzonatate  100 mg Oral TID  . budesonide (PULMICORT) nebulizer solution  0.5 mg Nebulization BID  . enoxaparin (LOVENOX) injection  40 mg Subcutaneous Q24H  . furosemide  20 mg Intravenous Q12H  . ipratropium-albuterol  3 mL Nebulization Q4H  . methylPREDNISolone (SOLU-MEDROL) injection  40 mg Intravenous Q12H  . nicotine  21 mg Transdermal Daily  . pantoprazole  40 mg Oral Daily  . potassium chloride SA  20 mEq Oral Daily  . sodium chloride flush  3 mL Intravenous Q12H   Continuous Infusions: . sodium chloride Stopped (04/15/18 0811)  . dexmedetomidine (PRECEDEX) IV infusion 0.4 mcg/kg/hr (04/15/18 1315)   PRN Meds:.sodium chloride, acetaminophen **OR** acetaminophen, albuterol, LORazepam, Melatonin, morphine injection, ondansetron **OR** ondansetron (ZOFRAN) IV, oxyCODONE, senna-docusate, sodium chloride flush Medications Prior to Admission:  Prior to Admission medications   Medication Sig Start Date End Date Taking? Authorizing Provider  albuterol (PROVENTIL HFA;VENTOLIN HFA) 108 (90 Base) MCG/ACT inhaler Inhale 2 puffs into the lungs every 6 (six) hours as needed for wheezing or shortness of  breath. 11/26/17  Yes Kalisetti, Radhika, MD  atorvastatin (LIPITOR) 40 MG tablet Take 40 mg by mouth at bedtime.    Yes [provider]  furosemide (LASIX) 20 MG tablet Take 20 mg by mouth daily.    Yes [provider]  ipratropium-albuterol (DUONEB) 0.5-2.5 (3) MG/3ML SOLN Take 3 mLs by nebulization every 6 (six) hours as needed (wheezing). 11/26/17  Yes Kalisetti, Radhika, MD  Melatonin 1 MG TABS Take 1-5 mg by mouth at bedtime as needed (sleep).    Yes [provider]  omeprazole (PRILOSEC) 20 MG capsule Take 20 mg by mouth daily.    Yes [provider]  oxyCODONE (OXY IR/ROXICODONE) 5 MG immediate release tablet Take 1 tablet (5 mg total) by mouth every 6 (six) hours as needed for severe pain. 04/08/18 05/08/18 Yes King, Crystal M, NP  polyethylene glycol (MIRALAX / GLYCOLAX) packet Take 17 g by mouth daily. Patient taking differently: Take 17 g by mouth daily as needed for mild constipation or moderate constipation.  11/27/17  Yes Kalisetti, Radhika, MD  potassium chloride SA (K-DUR,KLOR-CON) 20 MEQ tablet Take 20 mEq by mouth daily.   Yes [provider]  oxyCODONE (OXY IR/ROXICODONE) 5 MG immediate release tablet Take 1 tablet (5 mg total) by mouth every 6 (six) hours as needed for severe pain. 05/08/18 06/07/18  King, Crystal M, NP   Allergies  Allergen Reactions  . Lisinopril Anaphylaxis   Review of Systems  Constitutional:       Anxiety  Respiratory: Positive for shortness of breath.   Neurological: Positive for weakness.   Physical Exam  Constitutional: She is oriented to person, place, and time. She is cooperative. She appears ill.  HENT:  Head: Normocephalic and atraumatic.  Cardiovascular: Regular rhythm.  Pulmonary/Chest: No accessory muscle usage. No tachypnea. No respiratory distress.  Neurological: She is alert and oriented to person, place, and time.  Skin: Skin is dry.  Psychiatric: She has a normal mood and affect. Her speech  is normal and behavior is normal. Cognition and memory are normal.  Nursing note and vitals reviewed.  Vital Signs: BP 123/72   Pulse 78   Temp (!) 97.4 F (36.3   C) (Axillary)   Resp 18   Ht 4' 9" (1.448 m)   Wt 62.3 kg (137 lb 5.6 oz)   SpO2 94%   BMI 29.72 kg/m  Pain Scale: 0-10 POSS *See Group Information*: 1-Acceptable,Awake and alert Pain Score: 8   SpO2: SpO2: 94 % O2 Device:SpO2: 94 % O2 Flow Rate: .O2 Flow Rate (L/min): 6 L/min  IO: Intake/output summary:   Intake/Output Summary (Last 24 hours) at 04/15/2018 1616 Last data filed at 04/15/2018 1422 Gross per 24 hour  Intake 428.11 ml  Output 550 ml  Net -121.89 ml    LBM:   Baseline Weight: Weight: 55.3 kg (122 lb) Most recent weight: Weight: 62.3 kg (137 lb 5.6 oz)     Palliative Assessment/Data: PPS 50%   Flowsheet Rows     Most Recent Value  Intake Tab  Referral Department  Critical care  Unit at Time of Referral  ICU  Palliative Care Primary Diagnosis  Pulmonary  Palliative Care Type  New Palliative care  Reason for referral  Clarify Goals of Care  Date first seen by Palliative Care  04/15/18  Clinical Assessment  Palliative Performance Scale Score  50%  Psychosocial & Spiritual Assessment  Palliative Care Outcomes  Patient/Family meeting held?  Yes  Who was at the meeting?  patient  Palliative Care Outcomes  Clarified goals of care, Provided psychosocial or spiritual support, ACP counseling assistance, Linked to palliative care logitudinal support      Time In: 4098  Time Out: 1520 Time Total: 4mn Greater than 50%  of this time was spent counseling and coordinating care related to the above assessment and plan.  Signed by:  MIhor Dow FNP-C Palliative Medicine Team  Phone: [phone]: [phone]  Please contact Palliative Medicine Team phone at [phone] questions and concerns.  For individual provider: See AShea Evans

## 2018-04-15 NOTE — Progress Notes (Signed)
CRITICAL CARE NOTE  CC  follow up respiratory failure  SUBJECTIVE Patient remains critically ill Prognosis is guarded Severe COPD exacerbation  On biPAP    BP 139/82   Pulse (!) 55   Temp 98.3 F (36.8 C) (Axillary)   Resp 18   Ht 4\' 9"  (1.448 m)   Wt 137 lb 5.6 oz (62.3 kg)   SpO2 98%   BMI 29.72 kg/m    REVIEW OF SYSTEMS  PATIENT IS UNABLE TO PROVIDE COMPLETE REVIEW OF SYSTEM S DUE TO SEVERE CRITICAL ILLNESS AND ENCEPHALOPATHY   PHYSICAL EXAMINATION:  GENERAL:critically ill appearing, +resp distress HEAD: Normocephalic, atraumatic.  EYES: Pupils equal, round, reactive to light.  No scleral icterus.  MOUTH: Moist mucosal membrane. NECK: Supple. No thyromegaly. No nodules. No JVD.  PULMONARY: +rhonchi, +wheezing CARDIOVASCULAR: S1 and S2. Regular rate and rhythm. No murmurs, rubs, or gallops.  GASTROINTESTINAL: Soft, nontender, -distended. No masses. Positive bowel sounds. No hepatosplenomegaly.  MUSCULOSKELETAL: No swelling, clubbing, or edema.  NEUROLOGIC: lethargic SKIN:intact,warm,dry  ASSESSMENT AND PLAN 57 yo WF with acute and severe COPD exacerbation with h/o CHF now with progressive resp failure on biPAP   Severe Hypoxic and Hypercapnic Respiratory Failure -continue Full MV support -continue Bronchodilator Therapy -IV abx and iv steroids    NEUROLOGY precedex if needed Morphine as needed   CARDIAC ICU monitoring Lasix as tolerated  ID -continue  abx as prescibed -follow up cultures   DVT/GI PRX ordered TRANSFUSIONS AS NEEDED MONITOR FSBS ASSESS the need for LABS as needed   Critical Care Time devoted to patient care services described in this note is 36 minutes.   Overall, patient is critically ill, prognosis is guarded.  Patient with Multiorgan failure and at high risk for cardiac arrest and death.    Lucie LeatherKurian David Leyah Bocchino, M.D.  [facility]  Medical Director [facility] Medical Director  [facility]

## 2018-04-16 LAB — PHOSPHORUS: PHOSPHORUS: 4.1 mg/dL (ref 2.5–4.6)

## 2018-04-16 LAB — BASIC METABOLIC PANEL
Anion gap: 9 (ref 5–15)
BUN: 37 mg/dL — ABNORMAL HIGH (ref 6–20)
CHLORIDE: 102 mmol/L (ref 98–111)
CO2: 30 mmol/L (ref 22–32)
CREATININE: 0.8 mg/dL (ref 0.44–1.00)
Calcium: 8.7 mg/dL — ABNORMAL LOW (ref 8.9–10.3)
GFR calc non Af Amer: >60 mL/min (ref >60)
Glucose, Bld: 117 mg/dL — ABNORMAL HIGH (ref 70–99)
POTASSIUM: 3.7 mmol/L (ref 3.5–5.1)
Sodium: 141 mmol/L (ref 135–145)

## 2018-04-16 LAB — MAGNESIUM: MAGNESIUM: 2.7 mg/dL — AB (ref 1.7–2.4)

## 2018-04-16 LAB — T3: T3, Total: 62 ng/dL — ABNORMAL LOW (ref 71–180)

## 2018-04-16 MED ORDER — POLYETHYLENE GLYCOL 3350 17 G PO PACK
17.0000 g | PACK | Freq: Every day | ORAL | Status: DC
  Administered 2018-04-16 – 2018-04-17 (×2): 17 g via ORAL
  Filled 2018-04-16 (×2): qty 1

## 2018-04-16 MED ORDER — DOCUSATE SODIUM 100 MG PO CAPS
100.0000 mg | ORAL_CAPSULE | Freq: Every day | ORAL | Status: DC
Start: 2018-04-16 — End: 2018-04-18
  Administered 2018-04-16 – 2018-04-17 (×2): 100 mg via ORAL
  Filled 2018-04-16 (×2): qty 1

## 2018-04-16 NOTE — Progress Notes (Signed)
Talked to Dr. Allena KatzPatel, about patient's last BM being a week ago, patient on oxycodone for pain. Order given for Miralax and colace. RN will continue to monitor.

## 2018-04-16 NOTE — Progress Notes (Signed)
[facility] - [place] at [facility]   PATIENT NAME: Elizabeth FellersRita [name]    MR#:  [id number]  DATE OF BIRTH:  Mar 31, 1961  SUBJECTIVE:   Feels a lot better after IV Lasix. She is off BiPAP. I will. Breathing comfortably. REVIEW OF SYSTEMS:   Review of Systems  Constitutional: Negative for chills, fever and weight loss.  HENT: Negative for ear discharge, ear pain and nosebleeds.   Eyes: Negative for blurred vision, pain and discharge.  Respiratory: Positive for cough and shortness of breath. Negative for sputum production, wheezing and stridor.   Cardiovascular: Negative for chest pain, palpitations, orthopnea and PND.  Gastrointestinal: Negative for abdominal pain, diarrhea, nausea and vomiting.  Genitourinary: Negative for frequency and urgency.  Musculoskeletal: Negative for back pain and joint pain.  Neurological: Negative for sensory change, speech change, focal weakness and weakness.  Psychiatric/Behavioral: Negative for depression and hallucinations. The patient is nervous/anxious.    Tolerating Diet:yes Tolerating PT: pending  DRUG ALLERGIES:   Allergies  Allergen Reactions  . Lisinopril Anaphylaxis    VITALS:  Blood pressure (!) 98/59, pulse 89, temperature 98.2 F (36.8 C), temperature source Oral, resp. rate (!) 25, height 4\' 9"  (1.448 m), weight 62.3 kg (137 lb 5.6 oz), SpO2 92 %.  PHYSICAL EXAMINATION:   Physical Exam  GENERAL:  57 y.o.-year-old patient lying in the bed with no acute distress.  EYES: Pupils equal, round, reactive to light and accommodation. No scleral icterus. Extraocular muscles intact.  HEENT: Head atraumatic, normocephalic. Oropharynx and nasopharynx clear. BIPAP NECK:  Supple, no jugular venous distention. No thyroid enlargement, no tenderness.  LUNGS; distant breath sounds bilaterally, no wheezing, rales, rhonchi. No use of accessory muscles of respiration.  CARDIOVASCULAR: S1, S2 normal. No murmurs, rubs, or gallops.   ABDOMEN: Soft, nontender, nondistended. Bowel sounds present. No organomegaly or mass.  EXTREMITIES: No cyanosis, clubbing or edema b/l.    NEUROLOGIC: Cranial nerves II through XII are intact. No focal Motor or sensory deficits b/l.   PSYCHIATRIC:  patient is alert and oriented x 3. anxious SKIN: No obvious rash, lesion, or ulcer.   LABORATORY PANEL:  CBC Recent Labs  Lab 04/13/18 0518  WBC 8.7  HGB 12.0  HCT 34.9*  PLT 230    Chemistries  Recent Labs  Lab 04/11/18 1509  04/16/18 0545  NA 142   < > 141  K 3.9   < > 3.7  CL 109   < > 102  CO2 21*   < > 30  GLUCOSE 100*   < > 117*  BUN 20   < > 37*  CREATININE 0.85   < > 0.80  CALCIUM 9.2   < > 8.7*  MG  --    < > 2.7*  AST 27  --   --   ALT 17  --   --   ALKPHOS 132*  --   --   BILITOT 0.8  --   --    < > = values in this interval not displayed.   Cardiac Enzymes Recent Labs  Lab 04/11/18 1509  TROPONINI <0.03   RADIOLOGY:  No results found. ASSESSMENT AND PLAN:  57 year old female patient with history of COPD, congestive heart failure, GERD, anxiety disorder presented to the emergency room for shortness of breath and cough.  -Acute respiratory failure hypercapnic and hypoxic due to COPD exacerbation and CHF exacerbation acute on chronic systolic Intensivist following-- per Dr Belia Hemankasa if pt does not improve may consider intubation.  -  now off  BIPAP -patient has issues with anxiety.  -Palliative care consulted -IV Lasix 20 mg BID. Patient diuresis well. Feels a lot better.   -Acute COPD exacerbation IV Solu-Medrol 40 MG DQ 12 hourly Aggressive nebulization treatment  -GERD continue proton pump inhibitor  -Acute on chronic congestive heart failure Continue iv Lasix for diuresis Monitor intake and output and daily weights  -Tobacco cessation counseled to the patient for 6 minutes Nicotine patch offered  -DVT prophylaxis subcu Lovenox daily   Case discussed with Care Management/Social  Worker. Management plans discussed with the patient, family and they are in agreement.  CODE STATUS: full  DVT Prophylaxis: lovenox  TOTAL TIME TAKING CARE OF THIS PATIENT: *30* minutes.  >50% time spent on counselling and coordination of care  POSSIBLE D/C IN *??* DAYS, DEPENDING ON CLINICAL CONDITION.  Note: This dictation was prepared with Dragon dictation along with smaller phrase technology. Any transcriptional errors that result from this process are unintentional.  Elizabeth [name] M.D on 04/16/2018 at 10:42 AM  Between 7am to 6pm - Pager - [phone]  After 6pm go to [email or web address] - Social research officer, government  [facility]  Office  [phone]  CC: Primary care physician; [facility]: Elizabeth [name], female   DOB: 10-Nov-1960, 57 y.o.   MRN: [id number]

## 2018-04-16 NOTE — Progress Notes (Signed)
Patient refusing bipap at night

## 2018-04-16 NOTE — Plan of Care (Signed)
  Problem: Clinical Measurements: Goal: Respiratory complications will improve Outcome: Progressing   Problem: Respiratory: Goal: Ability to maintain a clear airway will improve Outcome: Progressing   Problem: Respiratory: Goal: Levels of oxygenation will improve Outcome: Progressing   Problem: Respiratory: Goal: Ability to maintain adequate ventilation will improve Outcome: Progressing   Patient states she can breathe much better now; on 4LNC with O2 sats 90-94%.  No complaints of shortness of breath.   Problem: Activity: Goal: Risk for activity intolerance will decrease Outcome: Progressing   Problem: Safety: Goal: Ability to remain free from injury will improve Outcome: Progressing   Problem: Activity: Goal: Ability to tolerate increased activity will improve Outcome: Progressing   Problem: Activity: Goal: Will verbalize the importance of balancing activity with adequate rest periods Outcome: Progressing   Patient ambulated from bed to chair with stand-by assist.  Tolerated activity well with no drop in O2 sats.     Problem: Pain Managment: Goal: General experience of comfort will improve Outcome: Progressing   Problem: Coping: Goal: Level of anxiety will decrease Outcome: Progressing   Patient states she feels much less anxious now that she can breathe better.  Her pain is better controlled with pain medication and sitting up in chair.

## 2018-04-16 NOTE — Progress Notes (Signed)
Pharmacy Electrolyte Monitoring Consult:  Pharmacy consulted to assist in monitoring and replacing electrolytes in this 57 y.o. female admitted on 04/11/2018 with Shortness of Breath   Labs:  Sodium (mmol/L)  Date Value  04/16/2018 141  08/30/2017 144  03/08/2014 139   Potassium (mmol/L)  Date Value  04/16/2018 3.7  03/08/2014 4.0   Magnesium (mg/dL)  Date Value  16/10/960407/03/2018 2.7 (H)  03/09/2014 2.2   Phosphorus (mg/dL)  Date Value  54/09/811907/03/2018 4.1   Calcium (mg/dL)  Date Value  14/78/295607/03/2018 8.7 (L)   Calcium, Total (mg/dL)  Date Value  21/30/865705/28/2015 8.9   Albumin (g/dL)  Date Value  84/69/629507/10/2017 4.3  08/30/2017 4.3  03/08/2014 3.1 (L)    Assessment/Plan: Patient's furosemide 20mg  oral transitioned to 20mg  IV Q12hr on am ICU rounds.  No supplementation is warranted at this time.  Will obtain follow up electrolytes with am labs. Will need to replace orally as tolerated.   Pharmacy will continue to monitor and adjust per consult.   Stormy CardKatsoudas,Pasha Gadison K, [facility] 04/16/2018 9:02 AM

## 2018-04-17 MED ORDER — ZOLPIDEM TARTRATE 5 MG PO TABS
5.0000 mg | ORAL_TABLET | Freq: Once | ORAL | Status: AC
  Administered 2018-04-17: 5 mg via ORAL
  Filled 2018-04-17: qty 1

## 2018-04-17 MED ORDER — FUROSEMIDE 10 MG/ML IJ SOLN
20.0000 mg | Freq: Every day | INTRAMUSCULAR | Status: DC
  Administered 2018-04-18: 20 mg via INTRAVENOUS
  Filled 2018-04-17: qty 2

## 2018-04-17 MED ORDER — PREDNISONE 50 MG PO TABS
50.0000 mg | ORAL_TABLET | Freq: Every day | ORAL | Status: DC
  Administered 2018-04-18: 50 mg via ORAL
  Filled 2018-04-17: qty 1

## 2018-04-17 NOTE — Progress Notes (Signed)
[facility] - [place] at [facility]   PATIENT NAME: Elizabeth [name]    MR#:  [id number]  DATE OF BIRTH:  1961-02-18  SUBJECTIVE:   Feels a lot better after IV Lasix.Breathing comfortably. REVIEW OF SYSTEMS:   Review of Systems  Constitutional: Negative for chills, fever and weight loss.  HENT: Negative for ear discharge, ear pain and nosebleeds.   Eyes: Negative for blurred vision, pain and discharge.  Respiratory: Positive for cough and shortness of breath. Negative for sputum production, wheezing and stridor.   Cardiovascular: Negative for chest pain, palpitations, orthopnea and PND.  Gastrointestinal: Negative for abdominal pain, diarrhea, nausea and vomiting.  Genitourinary: Negative for frequency and urgency.  Musculoskeletal: Negative for back pain and joint pain.  Neurological: Negative for sensory change, speech change, focal weakness and weakness.  Psychiatric/Behavioral: Negative for depression and hallucinations. The patient is nervous/anxious.    Tolerating Diet:yes Tolerating PT: pending  DRUG ALLERGIES:   Allergies  Allergen Reactions  . Lisinopril Anaphylaxis    VITALS:  Blood pressure 122/82, pulse 73, temperature 98 F (36.7 C), temperature source Oral, resp. rate 18, height 4\' 9"  (1.448 m), weight 62.3 kg (137 lb 5.6 oz), SpO2 94 %.  PHYSICAL EXAMINATION:   Physical Exam  GENERAL:  57 y.o.-year-old patient lying in the bed with no acute distress.  EYES: Pupils equal, round, reactive to light and accommodation. No scleral icterus. Extraocular muscles intact.  HEENT: Head atraumatic, normocephalic. Oropharynx and nasopharynx clear. BIPAP NECK:  Supple, no jugular venous distention. No thyroid enlargement, no tenderness.  LUNGS; distant breath sounds bilaterally, no wheezing, rales, rhonchi. No use of accessory muscles of respiration.  CARDIOVASCULAR: S1, S2 normal. No murmurs, rubs, or gallops.  ABDOMEN: Soft, nontender,  nondistended. Bowel sounds present. No organomegaly or mass.  EXTREMITIES: No cyanosis, clubbing or edema b/l.    NEUROLOGIC: Cranial nerves II through XII are intact. No focal Motor or sensory deficits b/l.   PSYCHIATRIC:  patient is alert and oriented x 3. anxious SKIN: No obvious rash, lesion, or ulcer.   LABORATORY PANEL:  CBC Recent Labs  Lab 04/13/18 0518  WBC 8.7  HGB 12.0  HCT 34.9*  PLT 230    Chemistries  Recent Labs  Lab 04/11/18 1509  04/16/18 0545  NA 142   < > 141  K 3.9   < > 3.7  CL 109   < > 102  CO2 21*   < > 30  GLUCOSE 100*   < > 117*  BUN 20   < > 37*  CREATININE 0.85   < > 0.80  CALCIUM 9.2   < > 8.7*  MG  --    < > 2.7*  AST 27  --   --   ALT 17  --   --   ALKPHOS 132*  --   --   BILITOT 0.8  --   --    < > = values in this interval not displayed.   Cardiac Enzymes Recent Labs  Lab 04/11/18 1509  TROPONINI <0.03   RADIOLOGY:  No results found. ASSESSMENT AND PLAN:  57 year old female patient with history of COPD, congestive heart failure, GERD, anxiety disorder presented to the emergency room for shortness of breath and cough.  -Acute respiratory failure hypercapnic and hypoxic due to COPD exacerbation and CHF exacerbation acute on chronic systolic -now off  BIPAP -patient has issues with anxiety.  -Palliative care consulted -IV Lasix 20 mg BID. Patient diuresis well. Feels  a lot better.   -Acute COPD exacerbation IV Solu-Medrol 40 MG DQ 12 hourly Aggressive nebulization treatment  -GERD continue proton pump inhibitor  -Acute on chronic congestive heart failure Continue iv Lasix for diuresis Monitor intake and output and daily weights  -Tobacco cessation counseled to the patient for 6 minutes Nicotine patch offered  -DVT prophylaxis subcu Lovenox daily overall doing better. Patient recommended to ambulate in the room. If continues to do better will discharged home tomorrow.  Case discussed with Care Management/Social  Worker. Management plans discussed with the patient, family and they are in agreement.  CODE STATUS: full  DVT Prophylaxis: lovenox  TOTAL TIME TAKING CARE OF THIS PATIENT: *30* minutes.  >50% time spent on counselling and coordination of care  POSSIBLE D/C IN *1* DAYS, DEPENDING ON CLINICAL CONDITION.  Note: This dictation was prepared with Dragon dictation along with smaller phrase technology. Any transcriptional errors that result from this process are unintentional.  Enedina FinnerSona Kinzlee Selvy M.D on 04/17/2018 at 1:54 PM  Between 7am to 6pm - Pager - [phone]  After 6pm go to [email or web address] - Social research officer, governmentpassword EPAS [facility]  [facility] [place] [facility]  Office  [phone]  CC: Primary care physician; [facility]: Elizabeth [name], female   DOB: 07/17/1961, 57 y.o.   MRN: [id number]

## 2018-04-17 NOTE — Progress Notes (Signed)
Pharmacy Electrolyte Monitoring Consult:  Pharmacy consulted to assist in monitoring and replacing electrolytes in this 57 y.o. female admitted on 04/11/2018 with Shortness of Breath   Labs:  Sodium (mmol/L)  Date Value  04/16/2018 141  08/30/2017 144  03/08/2014 139   Potassium (mmol/L)  Date Value  04/16/2018 3.7  03/08/2014 4.0   Magnesium (mg/dL)  Date Value  21/30/865707/03/2018 2.7 (H)  03/09/2014 2.2   Phosphorus (mg/dL)  Date Value  84/69/629507/03/2018 4.1   Calcium (mg/dL)  Date Value  28/41/324407/03/2018 8.7 (L)   Calcium, Total (mg/dL)  Date Value  01/02/725305/28/2015 8.9   Albumin (g/dL)  Date Value  66/44/034707/10/2017 4.3  08/30/2017 4.3  03/08/2014 3.1 (L)    Assessment/Plan: No supplementation is warranted at this time.  Will obtain follow up electrolytes with am labs. Will need to replace orally as tolerated.   Pharmacy will continue to monitor and adjust per consult.   Stormy CardKatsoudas,Shera Laubach K, [facility] CenterRPH 04/17/2018 7:31 AM

## 2018-04-18 LAB — BASIC METABOLIC PANEL
Anion gap: 10 (ref 5–15)
BUN: 40 mg/dL — ABNORMAL HIGH (ref 6–20)
CALCIUM: 8.6 mg/dL — AB (ref 8.9–10.3)
CHLORIDE: 101 mmol/L (ref 98–111)
CO2: 29 mmol/L (ref 22–32)
CREATININE: 0.69 mg/dL (ref 0.44–1.00)
GFR calc Af Amer: >60 mL/min (ref >60)
GFR calc non Af Amer: >60 mL/min (ref >60)
GLUCOSE: 118 mg/dL — AB (ref 70–99)
Potassium: 3.1 mmol/L — ABNORMAL LOW (ref 3.5–5.1)
Sodium: 140 mmol/L (ref 135–145)

## 2018-04-18 LAB — MAGNESIUM: Magnesium: 2.6 mg/dL — ABNORMAL HIGH (ref 1.7–2.4)

## 2018-04-18 MED ORDER — CARVEDILOL 3.125 MG PO TABS: 3.1250 mg | ORAL_TABLET | Freq: Two times a day (BID) | ORAL | 1 refills | Status: DC

## 2018-04-18 MED ORDER — IPRATROPIUM-ALBUTEROL 0.5-2.5 (3) MG/3ML IN SOLN: 3.0000 mL | Freq: Four times a day (QID) | RESPIRATORY_TRACT | 1 refills | Status: DC | PRN

## 2018-04-18 MED ORDER — PREDNISONE 10 MG PO TABS: ORAL_TABLET | ORAL | 0 refills | Status: DC

## 2018-04-18 MED ORDER — FUROSEMIDE 20 MG PO TABS: 20.0000 mg | ORAL_TABLET | Freq: Every day | ORAL | Status: DC

## 2018-04-18 MED ORDER — CARVEDILOL 3.125 MG PO TABS: 3.1250 mg | ORAL_TABLET | Freq: Two times a day (BID) | ORAL | Status: DC

## 2018-04-18 MED ORDER — POTASSIUM CHLORIDE CRYS ER 20 MEQ PO TBCR
20.0000 meq | EXTENDED_RELEASE_TABLET | Freq: Two times a day (BID) | ORAL | Status: DC
  Administered 2018-04-18: 20 meq via ORAL
  Filled 2018-04-18: qty 1

## 2018-04-18 MED ORDER — FUROSEMIDE 20 MG PO TABS: 20.0000 mg | ORAL_TABLET | Freq: Every day | ORAL | 1 refills | Status: DC

## 2018-04-18 MED ORDER — GUAIFENESIN-DM 100-10 MG/5ML PO SYRP
5.0000 mL | ORAL_SOLUTION | ORAL | Status: DC | PRN
  Administered 2018-04-18: 5 mL via ORAL
  Filled 2018-04-18: qty 5

## 2018-04-18 MED ORDER — FUROSEMIDE 20 MG PO TABS: 20.0000 mg | ORAL_TABLET | Freq: Every day | ORAL | 2 refills | Status: DC

## 2018-04-18 NOTE — Care Management (Signed)
Patient to discharge home today. Home oxygen assessment performed and patient is not going to require home oxygen.  She declines home health services again.

## 2018-04-18 NOTE — Progress Notes (Signed)
Patient had one episode of waking up with shortness of breath, coughing and back pain. Given breathing treatment, cough med and pain medicine. Patient tearful at this time. Given emotional support. Able to relax after about 30 minutes. Will monitor.

## 2018-04-18 NOTE — Progress Notes (Signed)
Pharmacy Electrolyte Monitoring Consult:  Pharmacy consulted to assist in monitoring and replacing electrolytes in this 57 y.o. female admitted on 04/11/2018 with Shortness of Breath   Labs:  Sodium (mmol/L)  Date Value  04/18/2018 140  08/30/2017 144  03/08/2014 139   Potassium (mmol/L)  Date Value  04/18/2018 3.1 (L)  03/08/2014 4.0   Magnesium (mg/dL)  Date Value  16/10/960407/05/2018 2.6 (H)  03/09/2014 2.2   Phosphorus (mg/dL)  Date Value  54/09/811907/03/2018 4.1   Calcium (mg/dL)  Date Value  14/78/295607/05/2018 8.6 (L)   Calcium, Total (mg/dL)  Date Value  21/30/865705/28/2015 8.9   Albumin (g/dL)  Date Value  84/69/629507/10/2017 4.3  08/30/2017 4.3  03/08/2014 3.1 (L)    Assessment/Plan: No supplementation is warranted at this time.  Will obtain follow up electrolytes with am labs. Will need to replace orally as tolerated.   Pharmacy will continue to monitor and adjust per consult.   7/8 AM K+ 3.1. KCl 20 mEq PO x 2 doses ordered. Recheck K+ with tomorrow AM labs.  Desire Fulp S, [facility] MyersRPH 04/18/2018 10:23 AM

## 2018-04-18 NOTE — Discharge Summary (Addendum)
[facility]   PATIENT NAME: Elizabeth [name]    MR#:  [id number]  DATE OF BIRTH:  [birth year]  DATE OF ADMISSION:  04/11/2018 ADMITTING PHYSICIAN: Ihor AustinPavan Pyreddy, MD  DATE OF DISCHARGE: 04/18/2018  PRIMARY CARE PHYSICIAN: [facility]    ADMISSION DIAGNOSIS:  Acute respiratory distress [R06.03] COPD exacerbation (HCC) [J44.1]  DISCHARGE DIAGNOSIS:   Acute respiratory failure hypercapnic and hypoxic due to COPD exacerbation and CHF exacerbation acute on chronic systolic Tobacco abuse SECONDARY DIAGNOSIS:   Past Medical History:  Diagnosis Date  . Anxiety   . Arm pain 07/25/2015  . CHF (congestive heart failure) (HCC)   . Congestive heart failure (HCC) 07/25/2015  . COPD (chronic obstructive pulmonary disease) (HCC)    never been idagnosed  . Depression   . Lower extremity pain 07/25/2015  . Reflux   . RSD (reflex sympathetic dystrophy)   . Spinal cord stimulator status   . Vitamin B12 deficiency   . Vitamin D deficiency     HOSPITAL COURSE:   57 year old female patient with history of COPD, congestive heart failure, GERD, anxiety disorder presented to the emergency room for shortness of breath and cough.  -Acute respiratory failure hypercapnic and hypoxic due to COPD exacerbation and CHF exacerbation acute on chronic systolic - off  BIPAP -patient has issues with anxiety.  -Palliative care consulted -IV Lasix 20 mg BID--change to po lasix 20 mg daily. Patient diuresis well. Feels a lot better.   -Acute COPD exacerbation IV Solu-Medrol 40 MG DQ 12 hourly---change to oral taper Aggressive nebulization treatment Sats 89-90% on ambulation  -GERD continue proton pump inhibitor  -Tobacco cessation counseled to the patient for 6 minutes Nicotine patch offered  -DVT prophylaxis subcu Lovenox daily overall doing better.  Ambulated well. D/c home   CONSULTS OBTAINED:    DRUG ALLERGIES:    Allergies  Allergen Reactions  . Lisinopril Anaphylaxis    DISCHARGE MEDICATIONS:   Allergies as of 04/18/2018      Reactions   Lisinopril Anaphylaxis      Medication List    STOP taking these medications   oxyCODONE 5 MG immediate release tablet Commonly known as:  Oxy IR/ROXICODONE     TAKE these medications   albuterol 108 (90 Base) MCG/ACT inhaler Commonly known as:  PROVENTIL HFA;VENTOLIN HFA Inhale 2 puffs into the lungs every 6 (six) hours as needed for wheezing or shortness of breath.   atorvastatin 40 MG tablet Commonly known as:  LIPITOR Take 40 mg by mouth at bedtime.   carvedilol 3.125 MG tablet Commonly known as:  COREG Take 1 tablet (3.125 mg total) by mouth 2 (two) times daily with a meal.   furosemide 20 MG tablet Commonly known as:  LASIX Take 1 tablet (20 mg total) by mouth daily.   ipratropium-albuterol 0.5-2.5 (3) MG/3ML Soln Commonly known as:  DUONEB Take 3 mLs by nebulization every 6 (six) hours as needed (wheezing).   Melatonin 1 MG Tabs Take 1-5 mg by mouth at bedtime as needed (sleep).   omeprazole 20 MG capsule Commonly known as:  PRILOSEC Take 20 mg by mouth daily.   polyethylene glycol packet Commonly known as:  MIRALAX / GLYCOLAX Take 17 g by mouth daily. What changed:    when to take this  reasons to take this   potassium chloride SA 20 MEQ tablet Commonly known as:  K-DUR,KLOR-CON Take 20 mEq by mouth daily.   predniSONE 10 MG tablet Commonly  known as:  DELTASONE Take 50 mg taper by 10 mg daily then stop Start taking on:  04/19/2018       If you experience worsening of your admission symptoms, develop shortness of breath, life threatening emergency, suicidal or homicidal thoughts you must seek medical attention immediately by calling 911 or calling your MD immediately  if symptoms less severe.  You Must read complete instructions/literature along with all the possible adverse reactions/side effects for all the  Medicines you take and that have been prescribed to you. Take any new Medicines after you have completely understood and accept all the possible adverse reactions/side effects.   Please note  You were cared for by a hospitalist during your hospital stay. If you have any questions about your discharge medications or the care you received while you were in the hospital after you are discharged, you can call the unit and asked to speak with the hospitalist on call if the hospitalist that took care of you is not available. Once you are discharged, your primary care physician will handle any further medical issues. Please note that NO REFILLS for any discharge medications will be authorized once you are discharged, as it is imperative that you return to your primary care physician (or establish a relationship with a primary care physician if you do not have one) for your aftercare needs so that they can reassess your need for medications and monitor your lab values. Today   SUBJECTIVE    Doing well VITAL SIGNS:  Blood pressure 119/74, pulse 74, temperature 97.7 F (36.5 C), resp. rate 18, height 4\' 9"  (1.448 m), weight 60.3 kg (132 lb 14.4 oz), SpO2 95 %.  I/O:    Intake/Output Summary (Last 24 hours) at 04/18/2018 1431 Last data filed at 04/18/2018 1342 Gross per 24 hour  Intake 680 ml  Output 900 ml  Net -220 ml    PHYSICAL EXAMINATION:  GENERAL:  57 y.o.-year-old patient lying in the bed with no acute distress.  EYES: Pupils equal, round, reactive to light and accommodation. No scleral icterus. Extraocular muscles intact.  HEENT: Head atraumatic, normocephalic. Oropharynx and nasopharynx clear.  NECK:  Supple, no jugular venous distention. No thyroid enlargement, no tenderness.  LUNGS: Normal breath sounds bilaterally, no wheezing, rales,rhonchi or crepitation. No use of accessory muscles of respiration.  CARDIOVASCULAR: S1, S2 normal. No murmurs, rubs, or gallops.  ABDOMEN: Soft,  non-tender, non-distended. Bowel sounds present. No organomegaly or mass.  EXTREMITIES: No pedal edema, cyanosis, or clubbing.  NEUROLOGIC: Cranial nerves II through XII are intact. Muscle strength 5/5 in all extremities. Sensation intact. Gait not checked.  PSYCHIATRIC: The patient is alert and oriented x 3.  SKIN: No obvious rash, lesion, or ulcer.   DATA REVIEW:   CBC  Recent Labs  Lab 04/13/18 0518  WBC 8.7  HGB 12.0  HCT 34.9*  PLT 230    Chemistries  Recent Labs  Lab 04/11/18 1509  04/18/18 0505  NA 142   < > 140  K 3.9   < > 3.1*  CL 109   < > 101  CO2 21*   < > 29  GLUCOSE 100*   < > 118*  BUN 20   < > 40*  CREATININE 0.85   < > 0.69  CALCIUM 9.2   < > 8.6*  MG  --    < > 2.6*  AST 27  --   --   ALT 17  --   --  ALKPHOS 132*  --   --   BILITOT 0.8  --   --    < > = values in this interval not displayed.    Microbiology Results   Recent Results (from the past 240 hour(s))  MRSA PCR Screening     Status: None   Collection Time: 04/11/18  5:30 PM  Result Value Ref Range Status   MRSA by PCR NEGATIVE NEGATIVE Final    Comment:        The GeneXpert MRSA Assay (FDA approved for NASAL specimens only), is one component of a comprehensive MRSA colonization surveillance program. It is not intended to diagnose MRSA infection nor to guide or monitor treatment for MRSA infections. Performed at [facility], [address], [place], [place] [address]     RADIOLOGY:  No results found.   Management plans discussed with the patient, family and they are in agreement.  CODE STATUS:     Code Status Orders  (From admission, onward)        Start     Ordered   04/11/18 1727  Full code  Continuous     04/11/18 1726    Code Status History    Date Active Date Inactive Code Status Order ID Comments User Context   11/23/2017 2050 11/26/2017 2224 Full Code [id number]  Auburn Bilberry, MD ED      TOTAL TIME TAKING CARE OF THIS PATIENT: *40*  minutes.    Enedina Finner M.D on 04/18/2018 at 2:31 PM  Between 7am to 6pm - Pager - [phone] After 6pm go to [email or web address] - Social research officer, government  [facility]  Office  [phone]  CC: Primary care physician; [facility]

## 2018-04-18 NOTE — Discharge Instructions (Signed)
Stop smoking cigarettes

## 2018-04-18 NOTE — Progress Notes (Signed)
SATURATION QUALIFICATIONS: (This note is used to comply with regulatory documentation for home oxygen)  Patient Saturations on Room Air at Rest = 89%  Patient Saturations on Room Air while Ambulating = 89%  Patient Saturations on X Liters of oxygen while Ambulating = X%  Please briefly explain why patient needs home oxygen: N/A Elizabeth BlendSteven M Renelle Stegenga

## 2018-04-18 NOTE — Care Management Note (Signed)
Case Management Note  Patient Details  Name: Elizabeth [name] MRN: [id number] Date of Birth: [birth year]  Subjective/Objective:                 Admitted from home with exacerbation of COPD and heart failure.  Required continuous bipap and now is on nasal cannula at three liters.  Supplemental oxygen is acute.   Palliative has consulted for goals of care.  Patient remains full code. Poor activity tolerance and receiving IV lasix. She currently has external catheter in place and CM discussed with patient and primary nurse of need to discontinue.  No agency preference for home health. Patient has medicaid. Patient does not feel she will need home health nurse follow up.   Action/Plan:  Heads up to [facility] for  possible need for home 02. Discussed with patient of the benefits of home health nurse follow up to prevent readmission and the "protocols" agency can provide.  May benefit from outpatient palliative. Continue to wean 02 and home 02 assessment. Discussed mobilizing patient and removing the external urinary catheter with primary  nurse  Expected Discharge Date:                   Expected Discharge Plan:     In-House Referral:     Discharge planning Services     Post Acute Care Choice:    Choice offered to:     DME Arranged:    DME Agency:     HH Arranged:    HH Agency:     Status of Service:     If discussed at [employer] of [employer] Meetings, dates discussed:    Additional Comments:  [name], [name], RN 04/18/2018, 9:00 AM

## 2018-04-21 ENCOUNTER — Telehealth: Payer: Self-pay | Admitting: *Deleted

## 2018-04-21 NOTE — Telephone Encounter (Signed)
Received referral for low dose lung cancer screening CT scan. Message left at phone number listed in EMR for patient to call me back to facilitate scheduling scan.  

## 2018-05-16 ENCOUNTER — Other Ambulatory Visit: Payer: Self-pay

## 2018-05-16 ENCOUNTER — Ambulatory Visit: Payer: Medicaid Other | Admitting: Nurse Practitioner

## 2018-05-16 ENCOUNTER — Encounter: Payer: Self-pay | Admitting: Nurse Practitioner

## 2018-05-16 ENCOUNTER — Ambulatory Visit: Payer: Medicaid Other | Attending: Nurse Practitioner | Admitting: Nurse Practitioner

## 2018-05-16 ENCOUNTER — Encounter: Payer: Self-pay | Admitting: *Deleted

## 2018-05-16 VITALS — BP 119/76 | HR 65 | Temp 98.1°F | Ht <= 58 in | Wt 120.0 lb

## 2018-05-16 DIAGNOSIS — M47816 Spondylosis without myelopathy or radiculopathy, lumbar region: Secondary | ICD-10-CM

## 2018-05-16 DIAGNOSIS — E559 Vitamin D deficiency, unspecified: Secondary | ICD-10-CM | POA: Insufficient documentation

## 2018-05-16 DIAGNOSIS — Z969 Presence of functional implant, unspecified: Secondary | ICD-10-CM | POA: Diagnosis not present

## 2018-05-16 DIAGNOSIS — I509 Heart failure, unspecified: Secondary | ICD-10-CM | POA: Insufficient documentation

## 2018-05-16 DIAGNOSIS — Z79899 Other long term (current) drug therapy: Secondary | ICD-10-CM | POA: Insufficient documentation

## 2018-05-16 DIAGNOSIS — M899 Disorder of bone, unspecified: Secondary | ICD-10-CM | POA: Diagnosis not present

## 2018-05-16 DIAGNOSIS — J449 Chronic obstructive pulmonary disease, unspecified: Secondary | ICD-10-CM | POA: Diagnosis not present

## 2018-05-16 DIAGNOSIS — G4700 Insomnia, unspecified: Secondary | ICD-10-CM | POA: Insufficient documentation

## 2018-05-16 DIAGNOSIS — G894 Chronic pain syndrome: Secondary | ICD-10-CM

## 2018-05-16 DIAGNOSIS — Z79891 Long term (current) use of opiate analgesic: Secondary | ICD-10-CM | POA: Diagnosis not present

## 2018-05-16 DIAGNOSIS — M79601 Pain in right arm: Secondary | ICD-10-CM | POA: Diagnosis not present

## 2018-05-16 DIAGNOSIS — Z9689 Presence of other specified functional implants: Secondary | ICD-10-CM | POA: Diagnosis not present

## 2018-05-16 DIAGNOSIS — M79602 Pain in left arm: Secondary | ICD-10-CM | POA: Insufficient documentation

## 2018-05-16 DIAGNOSIS — M79641 Pain in right hand: Secondary | ICD-10-CM | POA: Diagnosis present

## 2018-05-16 DIAGNOSIS — F329 Major depressive disorder, single episode, unspecified: Secondary | ICD-10-CM | POA: Insufficient documentation

## 2018-05-16 DIAGNOSIS — E538 Deficiency of other specified B group vitamins: Secondary | ICD-10-CM | POA: Insufficient documentation

## 2018-05-16 DIAGNOSIS — F411 Generalized anxiety disorder: Secondary | ICD-10-CM | POA: Diagnosis not present

## 2018-05-16 DIAGNOSIS — Z87891 Personal history of nicotine dependence: Secondary | ICD-10-CM | POA: Insufficient documentation

## 2018-05-16 DIAGNOSIS — G8929 Other chronic pain: Secondary | ICD-10-CM

## 2018-05-16 DIAGNOSIS — R700 Elevated erythrocyte sedimentation rate: Secondary | ICD-10-CM | POA: Insufficient documentation

## 2018-05-16 DIAGNOSIS — G90511 Complex regional pain syndrome I of right upper limb: Secondary | ICD-10-CM | POA: Diagnosis not present

## 2018-05-16 MED ORDER — OXYCODONE HCL 5 MG PO TABS: 5.0000 mg | ORAL_TABLET | Freq: Four times a day (QID) | ORAL | 0 refills | Status: DC | PRN

## 2018-05-16 NOTE — Progress Notes (Signed)
Nursing Pain Medication Assessment:  Safety precautions to be maintained throughout the outpatient stay will include: orient to surroundings, keep bed in low position, maintain call bell within reach at all times, provide assistance with transfer out of bed and ambulation.  Medication Inspection Compliance: Pill count conducted under aseptic conditions, in front of the patient. Neither the pills nor the bottle was removed from the patient's sight at any time. Once count was completed pills were immediately returned to the patient in their original bottle.  Medication: Oxycodone IR Pill/Patch Count: 83 of 120 pills remain Pill/Patch Appearance: Markings consistent with prescribed medication Bottle Appearance: Standard pharmacy container. Clearly labeled. Filled Date: 7 / 27 / 2019 Last Medication intake:  Today

## 2018-05-16 NOTE — Patient Instructions (Addendum)
____________________________________________________________________________________________  Medication Rules  Applies to: All patients receiving prescriptions (written or electronic).  Pharmacy of record: Pharmacy where electronic prescriptions will be sent. If written prescriptions are taken to a different pharmacy, please inform the nursing staff. The pharmacy listed in the electronic medical record should be the one where you would like electronic prescriptions to be sent.  Prescription refills: Only during scheduled appointments. Applies to both, written and electronic prescriptions.  NOTE: The following applies primarily to controlled substances (Opioid* Pain Medications).   Patient's responsibilities: 1. Pain Pills: Bring all pain pills to every appointment (except for procedure appointments). 2. Pill Bottles: Bring pills in original pharmacy bottle. Always bring newest bottle. Bring bottle, even if empty. 3. Medication refills: You are responsible for knowing and keeping track of what medications you need refilled. The day before your appointment, write a list of all prescriptions that need to be refilled. Bring that list to your appointment and give it to the admitting nurse. Prescriptions will be written only during appointments. If you forget a medication, it will not be "Called in", "Faxed", or "electronically sent". You will need to get another appointment to get these prescribed. 4. Prescription Accuracy: You are responsible for carefully inspecting your prescriptions before leaving our office. Have the discharge nurse carefully go over each prescription with you, before taking them home. Make sure that your name is accurately spelled, that your address is correct. Check the name and dose of your medication to make sure it is accurate. Check the number of pills, and the written instructions to make sure they are clear and accurate. Make sure that you are given enough medication to last  until your next medication refill appointment. 5. Taking Medication: Take medication as prescribed. Never take more pills than instructed. Never take medication more frequently than prescribed. Taking less pills or less frequently is permitted and encouraged, when it comes to controlled substances (written prescriptions).  6. Inform other Doctors: Always inform, all of your healthcare providers, of all the medications you take. 7. Pain Medication from other Providers: You are not allowed to accept any additional pain medication from any other Doctor or Healthcare provider. There are two exceptions to this rule. (see below) In the event that you require additional pain medication, you are responsible for notifying us, as stated below. 8. Medication Agreement: You are responsible for carefully reading and following our Medication Agreement. This must be signed before receiving any prescriptions from our practice. Safely store a copy of your signed Agreement. Violations to the Agreement will result in no further prescriptions. (Additional copies of our Medication Agreement are available upon request.) 9. Laws, Rules, & Regulations: All patients are expected to follow all Federal and State Laws, Statutes, Rules, & Regulations. Ignorance of the Laws does not constitute a valid excuse. The use of any illegal substances is prohibited. 10. Adopted CDC guidelines & recommendations: Target dosing levels will be at or below 60 MME/day. Use of benzodiazepines** is not recommended.  Exceptions: There are only two exceptions to the rule of not receiving pain medications from other Healthcare Providers. 1. Exception #1 (Emergencies): In the event of an emergency (i.e.: accident requiring emergency care), you are allowed to receive additional pain medication. However, you are responsible for: As soon as you are able, call our office [phone], at any time of the day or night, and leave a message stating your name, the  date and nature of the emergency, and the name and dose of the medication   prescribed. In the event that your call is answered by a member of our staff, make sure to document and save the date, time, and the name of the person that took your information.  2. Exception #2 (Planned Surgery): In the event that you are scheduled by another doctor or dentist to have any type of surgery or procedure, you are allowed (for a period no longer than 30 days), to receive additional pain medication, for the acute post-op pain. However, in this case, you are responsible for picking up a copy of our "Post-op Pain Management for Surgeons" handout, and giving it to your surgeon or dentist. This document is available at our office, and does not require an appointment to obtain it. Simply go to our office during business hours (Monday-Thursday from 8:00 AM to 4:00 PM) (Friday 8:00 AM to 12:00 Noon) or if you have a scheduled appointment with us, prior to your surgery, and ask for it by name. In addition, you will need to provide us with your name, name of your surgeon, type of surgery, and date of procedure or surgery.  *Opioid medications include: morphine, codeine, oxycodone, oxymorphone, hydrocodone, hydromorphone, meperidine, tramadol, tapentadol, buprenorphine, fentanyl, methadone. **Benzodiazepine medications include: diazepam (Valium), alprazolam (Xanax), clonazepam (Klonopine), lorazepam (Ativan), clorazepate (Tranxene), chlordiazepoxide (Librium), estazolam (Prosom), oxazepam (Serax), temazepam (Restoril), triazolam (Halcion) (Last updated: 12/09/2017) ____________________________________________________________________________________________   Elizabeth QuinYou have received 3 prescriptions, oxycodone 5 mg to begin filling on 06/07/18 to last until 09/05/18

## 2018-05-16 NOTE — Progress Notes (Signed)
Patient's Name: Elizabeth [name]  MRN: [id number]  Referring Provider: [facility], [place]*  DOB: 1961-06-04  PCP: [facility], [place]: 05/16/2018  Note by: Vevelyn Francois NP  Service setting: Ambulatory outpatient  Specialty: Interventional Pain Management  Location: [facility] (AMB) Pain Management Facility    Patient type: Established    Primary Reason(s) for Visit: Encounter for prescription drug management. (Level of risk: moderate)  CC: Hand Pain (right)  HPI  Elizabeth [name] is a 57 y.o. year old, female patient, who comes today for a medication management evaluation. She has Chronic low back pain; Generalized anxiety disorder; Encounter for therapeutic drug level monitoring; Long term current use of opiate analgesic; Uncomplicated opioid dependence ([place]); Opiate use (30 MME/Day); Chronic pain syndrome; Complex regional pain syndrome I of upper limb (Bilateral); Complex regional pain syndrome I of lower limb (Bilateral); Opiate analgesic use agreement exists; Presence of spinal cord stimulator; COPD exacerbation ([place]); Congestive heart failure ([place]); Vitamin D deficiency; Vitamin B12 deficiency; Chronic upper extremity pain (Bilateral); Chronic lower extremity pain (Bilateral); Long term prescription opiate use; Goals of care, counseling/discussion; Elevated sedimentation rate; Insomnia secondary to chronic pain; Lumbar facet syndrome (Bilateral); Opioid-induced constipation (O[place]); Disorder of bone, unspecified; COPD with acute bronchitis ([place]); Respiratory failure ([place]); Palliative care by specialist; and Acute respiratory distress on their problem list. Her primarily concern today is the Hand Pain (right)  Pain Assessment: Location: Right Hand Radiating: pain radiates up to wrist  Onset: More than a month ago Duration: Chronic pain Quality: Burning, Aching Severity: 3 /10 (subjective, self-reported pain score)  Note: Reported level is compatible with observation.                           Effect on ADL: limits daily activities Timing: Constant Modifying factors: medications, spinal cord stimalator BP: 119/76  HR: 65  Elizabeth [name] was last scheduled for an appointment on 03/23/2018 for medication management. During today's appointment we reviewed [name]'s chronic pain status, as well as her outpatient medication regimen. She admits that she was admitted to ICU for generalized edema and shortness of breathe. She admits that she will follow up with cardiology.  She denies any change in her chronic pain. She denies any side effects of her current medications.   The patient  reports that she does not use drugs. Her body mass index is 25.97 kg/m.  Further details on both, my assessment(s), as well as the proposed treatment plan, please see below.  Controlled Substance Pharmacotherapy Assessment REMS (Risk Evaluation and Mitigation Strategy)  Analgesic:Oxycodone IR 5 mg 1 tablet by mouth every 6 hours (20 mg/day) MME/day:30 mg/day Chauncey Fischer, RN  05/16/2018  9:44 AM  Sign at close encounter Nursing Pain Medication Assessment:  Safety precautions to be maintained throughout the outpatient stay will include: orient to surroundings, keep bed in low position, maintain call bell within reach at all times, provide assistance with transfer out of bed and ambulation.  Medication Inspection Compliance: Pill count conducted under aseptic conditions, in front of the patient. Neither the pills nor the bottle was removed from the patient's sight at any time. Once count was completed pills were immediately returned to the patient in their original bottle.  Medication: Oxycodone IR Pill/Patch Count: 83 of 120 pills remain Pill/Patch Appearance: Markings consistent with prescribed medication Bottle Appearance: Standard pharmacy container. Clearly labeled. Filled Date: 7 / 27 / 2019 Last Medication intake:  Today   Pharmacokinetics:  Liberation and absorption (onset of action):  WNL Distribution (time to peak effect): WNL Metabolism and excretion (duration of action): WNL         Pharmacodynamics: Desired effects: Analgesia: Elizabeth [name] reports >50% benefit. Functional ability: Patient reports that medication allows her to accomplish basic ADLs Clinically meaningful improvement in function (CMIF): Sustained CMIF goals met Perceived effectiveness: Described as relatively effective, allowing for increase in activities of daily living (ADL) Undesirable effects: Side-effects or Adverse reactions: None reported Monitoring: [place] PMP: Online review of the past 21-monthperiod conducted. Compliant with practice rules and regulations Last UDS on record: Summary  Date Value Ref Range Status  11/16/2017 FINAL  Final    Comment:    ==================================================================== TOXASSURE SELECT 13 (MW) ==================================================================== Test                             Result       Flag       Units Drug Present and Declared for Prescription Verification   Oxycodone                      4316         EXPECTED   ng/mg creat   Oxymorphone                    1585         EXPECTED   ng/mg creat   Noroxycodone                   5251         EXPECTED   ng/mg creat   Noroxymorphone                 828          EXPECTED   ng/mg creat    Sources of oxycodone are scheduled prescription medications.    Oxymorphone, noroxycodone, and noroxymorphone are expected    metabolites of oxycodone. Oxymorphone is also available as a    scheduled prescription medication. ==================================================================== Test                      Result    Flag   Units      Ref Range   Creatinine              187              mg/dL      >=20 ==================================================================== Declared Medications:  The flagging and interpretation on this report are based on the  following declared  medications.  Unexpected results may arise from  inaccuracies in the declared medications.  **Note: The testing scope of this panel includes these medications:  Oxycodone  **Note: The testing scope of this panel does not include following  reported medications:  Albuterol  Bisacodyl  Docusate  Melatonin  Polyethylene Glycol ==================================================================== For clinical consultation, please call [phone]====    UDS interpretation: Compliant          Medication Assessment Form: Reviewed. Patient indicates being compliant with therapy Treatment compliance: Compliant Risk Assessment Profile: Aberrant behavior: See prior evaluations. None observed or detected today Comorbid factors increasing risk of overdose: See prior notes. No additional risks detected today Risk of substance use disorder (SUD): Low Opioid Risk Tool - 05/16/18 0941      Family History of Substance Abuse   Alcohol  Negative  Illegal Drugs  Negative    Rx Drugs  Negative      Personal History of Substance Abuse   Alcohol  Negative    Illegal Drugs  Negative    Rx Drugs  Negative      Total Score   Opioid Risk Tool Scoring  0    Opioid Risk Interpretation  Low Risk      ORT Scoring interpretation table:  Score <3 = Low Risk for SUD  Score between 4-7 = Moderate Risk for SUD  Score >8 = High Risk for Opioid Abuse   Risk Mitigation Strategies:  Patient Counseling: Covered Patient-Prescriber Agreement (PPA): Present and active  Notification to other healthcare providers: Done  Pharmacologic Plan: No change in therapy, at this time.             Laboratory Chemistry  Inflammation Markers (CRP: Acute Phase) (ESR: Chronic Phase) Lab Results  Component Value Date   CRP 4.4 08/30/2017   ESRSEDRATE 30 08/30/2017                         Rheumatology Markers No results found for: RF, ANA, LABURIC,  URICUR, LYMEIGGIGMAB, LYMEABIGMQN, HLAB27                      Renal Function Markers Lab Results  Component Value Date   BUN 40 (H) 04/18/2018   CREATININE 0.69 04/18/2018   BCR 17 08/30/2017   GFRAA >60 04/18/2018   GFRNONAA >60 04/18/2018                             Hepatic Function Markers Lab Results  Component Value Date   AST 27 04/11/2018   ALT 17 04/11/2018   ALBUMIN 4.3 04/11/2018   ALKPHOS 132 (H) 04/11/2018                        Electrolytes Lab Results  Component Value Date   NA 140 04/18/2018   K 3.1 (L) 04/18/2018   CL 101 04/18/2018   CALCIUM 8.6 (L) 04/18/2018   MG 2.6 (H) 04/18/2018   PHOS 4.1 04/16/2018                        Neuropathy Markers Lab Results  Component Value Date   VITAMINB12 284 08/30/2017   HIV Non Reactive 04/12/2018                        Bone Pathology Markers Lab Results  Component Value Date   VD25OH 25.3 (L) 03/18/2016   VD125OH2TOT 76.2 03/18/2016   25OHVITD1 37 08/30/2017   25OHVITD2 1.0 08/30/2017   25OHVITD3 36 08/30/2017                         Coagulation Parameters Lab Results  Component Value Date   PLT 230 04/13/2018                        Cardiovascular Markers Lab Results  Component Value Date   BNP 16.0 11/23/2017   CKTOTAL 55 03/10/2014   CKMB < 0.5 (L) 02/16/2014   TROPONINI <0.03 04/11/2018   HGB 12.0 04/13/2018   HCT 34.9 (L) 04/13/2018  CA Markers No results found for: CEA, CA125, LABCA2                      Note: Lab results reviewed.  Recent Diagnostic Imaging Results  ECHOCARDIOGRAM COMPLETE                 *[place], [place] [id number]                            [phone]  ------------------------------------------------------------------- Transthoracic Echocardiography  Patient:    Yoshino, Broccoli MR #:       [id number] Study Date: 04/13/2018 Gender:      F Age:        20 Height:     144.8 cm Weight:     62.3 kg BSA:        1.61 m^2 Pt. Status: Room:       IC17A   ORDERING     [place], John  REFERRING    [place], John  ATTENDING    Quale, [place], Medical  ADMITTING    Pyreddy, Pavan  SONOGRAPHER  Charmayne Sheer, RDCS  cc:  ------------------------------------------------------------------- LV EF: 45%  ------------------------------------------------------------------- Indications:      Dyspnea 786.09.  ------------------------------------------------------------------- History:   PMH:   Congestive heart failure.  Chronic obstructive pulmonary disease.  ------------------------------------------------------------------- Study Conclusions  - Left ventricle: The cavity size was mildly dilated. Systolic   function was mildly reduced. The estimated ejection fraction was   45%. Hypokinesis of the anteroseptal myocardium. Doppler   parameters are consistent with abnormal left ventricular   relaxation (grade 1 diastolic dysfunction). - Aortic valve: Valve area (VTI): 2.95 cm^2. Valve area (Vmax):   2.84 cm^2. Valve area (Vmean): 2.85 cm^2. - Mitral valve: Valve area by continuity equation (using LVOT   flow): 6.25 cm^2. - Right ventricle: The cavity size was mildly dilated.  Impressions:  - Mild LV systolic dysfunction with wall motion abnormalities   suggestive of CAD. Grade 1 diastolic dysfunction.  ------------------------------------------------------------------- Study data:   Study status:  Routine.  Procedure:  The patient reported no pain pre or post test. Transthoracic echocardiography. Image quality was adequate.          Transthoracic echocardiography.  M-mode, complete 2D, spectral Doppler, and color Doppler.  Birthdate:  Patient birthdate: Sep 26, 1961.  Age:  Patient is 57 yr old.  Sex:  Gender: female.    BMI: 29.7 kg/m^2.  Blood pressure:     94/60  Patient status:  Inpatient.  Study  date: Study date: 04/13/2018. Study time: 10:53 AM.  Location:  ICU/CCU   -------------------------------------------------------------------  ------------------------------------------------------------------- Left ventricle:  The cavity size was mildly dilated. Systolic function was mildly reduced. The estimated ejection fraction was 45%.  Regional wall motion abnormalities:   Hypokinesis of the anteroseptal myocardium. Doppler parameters are consistent with abnormal left ventricular relaxation (grade 1 diastolic dysfunction).  ------------------------------------------------------------------- Aortic valve:  Sclerosis without stenosis.  Doppler:  There was no significant regurgitation.    VTI ratio of LVOT to aortic valve: 0.78. Valve area (VTI): 2.95 cm^2. Indexed valve area (VTI): 1.83 cm^2/m^2. Peak velocity ratio  of LVOT to aortic valve: 0.75. Valve area (Vmax): 2.84 cm^2. Indexed valve area (Vmax): 1.76 cm^2/m^2. Mean velocity ratio of LVOT to aortic valve: 0.75. Valve area (Vmean): 2.85 cm^2. Indexed valve area (Vmean): 1.77 cm^2/m^2. Mean gradient (S): 6 mm Hg. Peak gradient (S): 10 mm Hg.  ------------------------------------------------------------------- Mitral valve:   Doppler:  There was trivial regurgitation.    Valve area by pressure half-time: 6.29 cm^2. Indexed valve area by pressure half-time: 3.91 cm^2/m^2. Valve area by continuity equation (using LVOT flow): 6.25 cm^2. Indexed valve area by continuity equation (using LVOT flow): 3.88 cm^2/m^2.    Mean gradient (D): 2 mm Hg.  ------------------------------------------------------------------- Pulmonary veins: Common pulmonary vein:  The Doppler velocity and flow profile were normal.  ------------------------------------------------------------------- Right ventricle:  The cavity size was mildly dilated.  ------------------------------------------------------------------- Pulmonic valve:    Doppler:  There  was mild regurgitation.  ------------------------------------------------------------------- Tricuspid valve:   Doppler:  There was mild regurgitation.  ------------------------------------------------------------------- Measurements   Left ventricle                           Value          Reference  LV ID, ED, PLAX chordal          (L)     40.3  mm       43 - 52  LV ID, ES, PLAX chordal                  33.9  mm       23 - 38  LV fx shortening, PLAX chordal   (L)     16    %        >=29  LV PW thickness, ED                      8.06  mm       ----------  IVS/LV PW ratio, ED                      0.93           <=1.3  Stroke volume, 2D                        90    ml       ----------  Stroke volume/bsa, 2D                    56    ml/m^2   ----------  LV e&', lateral                           11.4  cm/s     ----------  LV E/e&', lateral                         6.17           ----------  LV e&', medial                            7.18  cm/s     ----------  LV E/e&', medial                          9.79           ----------  LV e&', average                           9.29  cm/s     ----------  LV E/e&', average                         7.57           ----------    Ventricular septum                       Value          Reference  IVS thickness, ED                        7.51  mm       ----------    LVOT                                     Value          Reference  LVOT ID, S                               22    mm       ----------  LVOT area                                3.8   cm^2     ----------  LVOT peak velocity, S                    121   cm/s     ----------  LVOT mean velocity, S                    84.7  cm/s     ----------  LVOT VTI, S                              23.7  cm       ----------  LVOT peak gradient, S                    6     mm Hg    ----------    Aortic valve                             Value          Reference  Aortic valve peak velocity, S            162   cm/s      ----------  Aortic valve mean velocity, S            113   cm/s     ----------  Aortic valve VTI, S                      30.5  cm       ----------  Aortic mean gradient, S                  6     mm Hg    ----------  Aortic peak gradient, S                  10    mm Hg    ----------  VTI ratio, LVOT/AV                       0.78           ----------  Aortic valve area, VTI                   2.95  cm^2     ----------  Aortic valve area/bsa, VTI               1.83  cm^2/m^2 ----------  Velocity ratio, peak, LVOT/AV            0.75           ----------  Aortic valve area, peak velocity         2.84  cm^2     ----------  Aortic valve area/bsa, peak              1.76  cm^2/m^2 ----------  velocity  Velocity ratio, mean, LVOT/AV            0.75           ----------  Aortic valve area, mean velocity         2.85  cm^2     ----------  Aortic valve area/bsa, mean              1.77  cm^2/m^2 ----------  velocity    Aorta                                    Value          Reference  Aortic root ID, ED                       26    mm       ----------    Left atrium                              Value          Reference  LA ID, A-P, ES                           34    mm       ----------  LA ID/bsa, A-P                           2.11  cm/m^2   <=2.2  LA volume, S                             28    ml       ----------  LA volume/bsa, S                         17.4  ml/m^2   ----------  LA volume, ES, 1-p A4C                   24.3  ml       ----------  LA volume/bsa, ES,  1-p A4C               15.1  ml/m^2   ----------  LA volume, ES, 1-p A2C                   30    ml       ----------  LA volume/bsa, ES, 1-p A2C               18.6  ml/m^2   ----------    Mitral valve                             Value          Reference  Mitral E-wave peak velocity              70.3  cm/s     ----------  Mitral A-wave peak velocity              93.6  cm/s     ----------  Mitral mean velocity, D                  66.2   cm/s     ----------  Mitral deceleration time         (L)     119   ms       150 - 230  Mitral pressure half-time                35    ms       ----------  Mitral mean gradient, D                  2     mm Hg    ----------  Mitral E/A ratio, peak                   0.8            ----------  Mitral valve area, PHT, DP               6.29  cm^2     ----------  Mitral valve area/bsa, PHT, DP           3.91  cm^2/m^2 ----------  Mitral valve area, LVOT                  6.25  cm^2     ----------  continuity  Mitral valve area/bsa, LVOT              3.88  cm^2/m^2 ----------  continuity  Mitral annulus VTI, D                    14.4  cm       ----------    Right atrium                             Value          Reference  RA ID, S-I, ES, A4C                      49    mm       34 - 49  RA area, ES, A4C                         15.4  cm^2  8.3 - 19.5  RA volume, ES, A/L                       40.5  ml       ----------  RA volume/bsa, ES, A/L                   25.2  ml/m^2   ----------    Right ventricle                          Value          Reference  RV ID, ED, PLAX                          29.3  mm       19 - 38  RV s&', lateral, S                        14    cm/s     ----------    Pulmonic valve                           Value          Reference  Pulmonic valve peak velocity, S          109   cm/s     ----------  Pulmonic acceleration time               111   ms       ----------  Legend: (L)  and  (H)  mark values outside specified reference range.  ------------------------------------------------------------------- Prepared and Electronically Authenticated by  Neoma Laming, MD 2019-07-03T19:55:21  Complexity Note: Imaging results reviewed. Results shared with Ms. Pho, using Layman's terms.                         Meds   Current Outpatient Medications:  .  albuterol (PROVENTIL HFA;VENTOLIN HFA) 108 (90 Base) MCG/ACT inhaler, Inhale 2 puffs into the lungs every 6 (six)  hours as needed for wheezing or shortness of breath., Disp: 1 Inhaler, Rfl: 2 .  atorvastatin (LIPITOR) 40 MG tablet, Take 40 mg by mouth at bedtime. , Disp: , Rfl:  .  carvedilol (COREG) 3.125 MG tablet, Take 1 tablet (3.125 mg total) by mouth 2 (two) times daily with a meal., Disp: 60 tablet, Rfl: 1 .  furosemide (LASIX) 20 MG tablet, Take 1 tablet (20 mg total) by mouth daily., Disp: 30 tablet, Rfl: 2 .  ipratropium-albuterol (DUONEB) 0.5-2.5 (3) MG/3ML SOLN, Take 3 mLs by nebulization every 6 (six) hours as needed (wheezing)., Disp: 360 mL, Rfl: 1 .  Melatonin 1 MG TABS, Take 1-5 mg by mouth at bedtime as needed (sleep). , Disp: , Rfl:  .  omeprazole (PRILOSEC) 20 MG capsule, Take 20 mg by mouth daily. , Disp: , Rfl:  .  polyethylene glycol (MIRALAX / GLYCOLAX) packet, Take 17 g by mouth daily. (Patient taking differently: Take 17 g by mouth daily as needed for mild constipation or moderate constipation. ), Disp: 14 each, Rfl: 0 .  potassium chloride SA (K-DUR,KLOR-CON) 20 MEQ tablet, Take 20 mEq by mouth daily., Disp: , Rfl:  .  [START ON 08/06/2018] oxyCODONE (OXY IR/ROXICODONE) 5 MG immediate release tablet, Take 1 tablet (5 mg total) by mouth every  6 (six) hours as needed for severe pain., Disp: 120 tablet, Rfl: 0 .  [START ON 07/07/2018] oxyCODONE (OXY IR/ROXICODONE) 5 MG immediate release tablet, Take 1 tablet (5 mg total) by mouth every 6 (six) hours as needed for severe pain., Disp: 120 tablet, Rfl: 0 .  [START ON 06/07/2018] oxyCODONE (OXY IR/ROXICODONE) 5 MG immediate release tablet, Take 1 tablet (5 mg total) by mouth every 6 (six) hours as needed for severe pain., Disp: 120 tablet, Rfl: 0  ROS  Constitutional: Denies any fever or chills Gastrointestinal: No reported hemesis, hematochezia, vomiting, or acute GI distress Musculoskeletal: Denies any acute onset joint swelling, redness, loss of ROM, or weakness Neurological: No reported episodes of acute onset apraxia, aphasia, dysarthria,  agnosia, amnesia, paralysis, loss of coordination, or loss of consciousness  Allergies  Ms. Ayars is allergic to lisinopril.  PFSH  Drug: Ms. Neff  reports that she does not use drugs. Alcohol:  reports that she does not drink alcohol. Tobacco:  reports that she quit smoking about 6 months ago. Her smoking use included cigarettes. She smoked 0.50 packs per day. She has never used smokeless tobacco. Medical:  has a past medical history of Anxiety, Arm pain (07/25/2015), CHF (congestive heart failure) ([place]), Congestive heart failure ([place]) (07/25/2015), COPD (chronic obstructive pulmonary disease) ([place]), Depression, Lower extremity pain (07/25/2015), Reflux, RSD (reflex sympathetic dystrophy), Spinal cord stimulator status, Vitamin B12 deficiency, and Vitamin D deficiency. Surgical: Ms. Galyon  has a past surgical history that includes Spinal cord stimulator implant. Family: family history includes Cancer in her father; Cirrhosis in her mother.  Constitutional Exam  General appearance: Well nourished, well developed, and well hydrated. In no apparent acute distress Vitals:   05/16/18 0930  BP: 119/76  Pulse: 65  Temp: 98.1 F (36.7 C)  SpO2: 97%  Weight: 120 lb (54.4 kg)  Height: _0  (1.448 m)   BMI Assessment: Estimated body mass index is 25.97 kg/m as calculated from the following:   Height as of this encounter: _1  (1.448 m).   Weight as of this encounter: 120 lb (54.4 kg).        Eyes: PERLA Respiratory: No evidence of acute respiratory distress  Cervical Spine Area Exam  Skin & Axial Inspection: No masses, redness, edema, swelling, or associated skin lesions Alignment: Symmetrical Functional ROM: Unrestricted ROM      Stability: No instability detected Muscle Tone/Strength: Functionally intact. No obvious neuro-muscular anomalies detected. Sensory (Neurological): Unimpaired Palpation: No palpable anomalies              Upper Extremity (UE) Exam    Side: Right  upper extremity  Side: Left upper extremity  Skin & Extremity Inspection: Skin color, temperature, and hair growth are WNL. No peripheral edema or cyanosis. No masses, redness, swelling, asymmetry, or associated skin lesions. No contractures.  Skin & Extremity Inspection: Skin color, temperature, and hair growth are WNL. No peripheral edema or cyanosis. No masses, redness, swelling, asymmetry, or associated skin lesions. No contractures.  Functional ROM: Adequate ROM          Functional ROM: Unrestricted ROM          Muscle Tone/Strength: Movement possible against gravity, but not against resistance (3/5)  Muscle Tone/Strength: Functionally intact. No obvious neuro-muscular anomalies detected.  Sensory (Neurological): Unimpaired          Sensory (Neurological): Unimpaired          Palpation: No palpable anomalies  Palpation: No palpable anomalies                   Thoracic Spine Area Exam  Skin & Axial Inspection: No masses, redness, or swelling Alignment: Symmetrical Functional ROM: Unrestricted ROM Stability: No instability detected Muscle Tone/Strength: Functionally intact. No obvious neuro-muscular anomalies detected. Sensory (Neurological): Unimpaired Muscle strength & Tone: No palpable anomalies  Gait & Posture Assessment  Ambulation: Unassisted Gait: Relatively normal for age and body habitus Posture: WNL   Assessment  Primary Diagnosis & Pertinent Problem List: The primary encounter diagnosis was Complex regional pain syndrome type 1 of right upper extremity. Diagnoses of Chronic pain of both upper extremities, Presence of spinal cord stimulator, Lumbar facet syndrome (Bilateral), Chronic pain syndrome, and Long term current use of opiate analgesic were also pertinent to this visit.  Status Diagnosis  Controlled Controlled Controlled 1. 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 Chronic pain of both upper extremities   3. Presence of spinal  cord stimulator   4. Lumbar facet syndrome (Bilateral)   5. Chronic pain syndrome   6. Long term current use of opiate analgesic     Problems updated and reviewed during this visit: No problems updated. Plan of Care  Pharmacotherapy (Medications Ordered): Meds ordered this encounter  Medications  . oxyCODONE (OXY IR/ROXICODONE) 5 MG immediate release tablet    Sig: Take 1 tablet (5 mg total) by mouth every 6 (six) hours as needed for severe pain.    Dispense:  120 tablet    Refill:  0    Do not place this medication, or any other prescription from our practice, on "Automatic Refill". Patient may have prescription filled one day early if pharmacy is closed on scheduled refill date. Do not fill until:08/06/2018 To last until:09/05/2018    Order Specific Question:   Supervising Provider    Answer:   Milinda Pointer [phone]  . oxyCODONE (OXY IR/ROXICODONE) 5 MG immediate release tablet    Sig: Take 1 tablet (5 mg total) by mouth every 6 (six) hours as needed for severe pain.    Dispense:  120 tablet    Refill:  0    Do not place this medication, or any other prescription from our practice, on "Automatic Refill". Patient may have prescription filled one day early if pharmacy is closed on scheduled refill date. Do not fill until:07/07/2018 To last until:08/06/2018    Order Specific Question:   Supervising Provider    Answer:   Milinda Pointer [phone]  . oxyCODONE (OXY IR/ROXICODONE) 5 MG immediate release tablet    Sig: Take 1 tablet (5 mg total) by mouth every 6 (six) hours as needed for severe pain.    Dispense:  120 tablet    Refill:  0    Do not place this medication, or any other prescription from our practice, on "Automatic Refill". Patient may have prescription filled one day early if pharmacy is closed on scheduled refill date. Do not fill until:06/07/2018 To last until:07/07/2018    Order Specific Question:   Supervising Provider    Answer:   Milinda Pointer [phone]    New Prescriptions   No medications on file   Medications administered today: Mikiyah L. Quashie had no medications administered during this visit. Lab-work, procedure(s), and/or referral(s): Orders Placed This Encounter  Procedures  . ToxASSURE Select 13 (MW), Urine   Imaging and/or referral(s): None  Interventional therapies: Planned, scheduled, and/or pending:  Not at this  time.   Considering:  Diagnostic bilateral lumbar facet block   Palliative PRN treatment(s):  Palliative stellate ganglion block for the upper extremity complex regional pain syndrome.  Palliative Lumbar sympathetic block for the lower extremity complex regional pain syndrome.  Palliativebilateral lumbar facet block    Provider-requested follow-up: Return in about 3 months (around 08/16/2018) for MedMgmt with Me Donella Stade Edison Pace).  Future Appointments  Date Time Provider [place]  08/15/2018  9:15 AM Vevelyn Francois, NP [facility] None   Primary Care Physician: [facility] [place] Location: [facility] [place] [facility] Note by: Vevelyn Francois NP Date: 05/16/2018; Time: 1:16 PM  Pain Score Disclaimer: We use the NRS-11 scale. This is a self-reported, subjective measurement of pain severity with only modest accuracy. It is used primarily to identify changes within a particular patient. It must be understood that outpatient pain scales are significantly less accurate that those used for research, where they can be applied under ideal controlled circumstances with minimal exposure to variables. In reality, the score is likely to be a combination of pain intensity and pain affect, where pain affect describes the degree of emotional arousal or changes in action readiness caused by the sensory experience of pain. Factors such as social and work situation, setting, emotional state, anxiety levels, expectation, and prior pain experience may influence pain perception and show large  inter-individual differences that may also be affected by time variables.  Patient instructions provided during this appointment: Patient Instructions  ____________________________________________________________________________________________  Medication Rules  Applies to: All patients receiving prescriptions (written or electronic).  Pharmacy of record: Pharmacy where electronic prescriptions will be sent. If written prescriptions are taken to a different pharmacy, please inform the nursing staff. The pharmacy listed in the electronic medical record should be the one where you would like electronic prescriptions to be sent.  Prescription refills: Only during scheduled appointments. Applies to both, written and electronic prescriptions.  NOTE: The following applies primarily to controlled substances (Opioid* Pain Medications).   Patient's responsibilities: 1. Pain Pills: Bring all pain pills to every appointment (except for procedure appointments). 2. Pill Bottles: Bring pills in original pharmacy bottle. Always bring newest bottle. Bring bottle, even if empty. 3. Medication refills: You are responsible for knowing and keeping track of what medications you need refilled. The day before your appointment, write a list of all prescriptions that need to be refilled. Bring that list to your appointment and give it to the admitting nurse. Prescriptions will be written only during appointments. If you forget a medication, it will not be "Called in", "Faxed", or "electronically sent". You will need to get another appointment to get these prescribed. 4. Prescription Accuracy: You are responsible for carefully inspecting your prescriptions before leaving our office. Have the discharge nurse carefully go over each prescription with you, before taking them home. Make sure that your name is accurately spelled, that your address is correct. Check the name and dose of your medication to make sure it is  accurate. Check the number of pills, and the written instructions to make sure they are clear and accurate. Make sure that you are given enough medication to last until your next medication refill appointment. 5. Taking Medication: Take medication as prescribed. Never take more pills than instructed. Never take medication more frequently than prescribed. Taking less pills or less frequently is permitted and encouraged, when it comes to controlled substances (written prescriptions).  6. Inform other Doctors: Always inform, all of your healthcare providers, of all  the medications you take. 7. Pain Medication from other Providers: You are not allowed to accept any additional pain medication from any other Doctor or Healthcare provider. There are two exceptions to this rule. (see below) In the event that you require additional pain medication, you are responsible for notifying us, as stated below. 8. Medication Agreement: You are responsible for carefully reading and following our Medication Agreement. This must be signed before receiving any prescriptions from our practice. Safely store a copy of your signed Agreement. Violations to the Agreement will result in no further prescriptions. (Additional copies of our Medication Agreement are available upon request.) 9. Laws, Rules, & Regulations: All patients are expected to follow all Federal and [employer], Rules, [employer]. Ignorance of the Laws does not constitute a valid excuse. The use of any illegal substances is prohibited. 10. Adopted CDC guidelines & recommendations: Target dosing levels will be at or below 60 MME/day. Use of benzodiazepines** is not recommended.  Exceptions: There are only two exceptions to the rule of not receiving pain medications from other Healthcare Providers. 1. Exception #1 (Emergencies): In the event of an emergency (i.e.: accident requiring emergency care), you are allowed to receive additional pain medication.  However, you are responsible for: As soon as you are able, call our office [phone], at any time of the day or night, and leave a message stating your name, the date and nature of the emergency, and the name and dose of the medication prescribed. In the event that your call is answered by a member of our staff, make sure to document and save the date, time, and the name of the person that took your information.  2. Exception #2 (Planned Surgery): In the event that you are scheduled by another doctor or dentist to have any type of surgery or procedure, you are allowed (for a period no longer than 30 days), to receive additional pain medication, for the acute post-op pain. However, in this case, you are responsible for picking up a copy of our "Post-op Pain Management for Surgeons" handout, and giving it to your surgeon or dentist. This document is available at our office, and does not require an appointment to obtain it. Simply go to our office during business hours (Monday-Thursday from 8:00 AM to 4:00 PM) (Friday 8:00 AM to 12:00 Noon) or if you have a scheduled appointment with [place], prior to your surgery, and ask for it by name. In addition, you will need to provide [place] with your name, name of your surgeon, type of surgery, and date of procedure or surgery.  *Opioid medications include: morphine, codeine, oxycodone, oxymorphone, hydrocodone, hydromorphone, meperidine, tramadol, tapentadol, buprenorphine, fentanyl, methadone. **Benzodiazepine medications include: diazepam (Valium), alprazolam (Xanax), clonazepam (Klonopine), lorazepam (Ativan), clorazepate (Tranxene), chlordiazepoxide (Librium), estazolam (Prosom), oxazepam (Serax), temazepam (Restoril), triazolam (Halcion) (Last updated: 12/09/2017) ____________________________________________________________________________________________   Dennis Bast have received 3 prescriptions, oxycodone 5 mg to begin filling on 06/07/18 to last until  09/05/18

## 2018-05-20 LAB — TOXASSURE SELECT 13 (MW), URINE

## 2018-05-24 ENCOUNTER — Emergency Department
Admission: EM | Admit: 2018-05-24 | Discharge: 2018-05-24 | Disposition: A | Discharge status: Home / Self Care | Payer: Medicaid Other | Attending: Emergency Medicine | Admitting: Emergency Medicine

## 2018-05-24 ENCOUNTER — Encounter: Payer: Self-pay | Admitting: Emergency Medicine

## 2018-05-24 ENCOUNTER — Emergency Department: Payer: Medicaid Other

## 2018-05-24 ENCOUNTER — Other Ambulatory Visit: Payer: Self-pay

## 2018-05-24 DIAGNOSIS — Z79899 Other long term (current) drug therapy: Secondary | ICD-10-CM | POA: Diagnosis not present

## 2018-05-24 DIAGNOSIS — G8929 Other chronic pain: Secondary | ICD-10-CM | POA: Diagnosis not present

## 2018-05-24 DIAGNOSIS — J449 Chronic obstructive pulmonary disease, unspecified: Secondary | ICD-10-CM | POA: Insufficient documentation

## 2018-05-24 DIAGNOSIS — Z87891 Personal history of nicotine dependence: Secondary | ICD-10-CM | POA: Insufficient documentation

## 2018-05-24 DIAGNOSIS — I509 Heart failure, unspecified: Secondary | ICD-10-CM | POA: Insufficient documentation

## 2018-05-24 DIAGNOSIS — M545 Low back pain: Secondary | ICD-10-CM | POA: Diagnosis not present

## 2018-05-24 DIAGNOSIS — R109 Unspecified abdominal pain: Secondary | ICD-10-CM | POA: Diagnosis present

## 2018-05-24 LAB — URINALYSIS, COMPLETE (UACMP) WITH MICROSCOPIC
BACTERIA UA: NONE SEEN
Bilirubin Urine: NEGATIVE
Glucose, UA: NEGATIVE mg/dL
Hgb urine dipstick: NEGATIVE
Ketones, ur: NEGATIVE mg/dL
Nitrite: NEGATIVE
PH: 5 (ref 5.0–8.0)
Protein, ur: NEGATIVE mg/dL
SPECIFIC GRAVITY, URINE: 1.005 (ref 1.005–1.030)

## 2018-05-24 MED ORDER — METHOCARBAMOL 500 MG PO TABS: 500.0000 mg | ORAL_TABLET | Freq: Four times a day (QID) | ORAL | 0 refills | Status: DC | PRN

## 2018-05-24 MED ORDER — NAPROXEN 500 MG PO TABS: 500.0000 mg | ORAL_TABLET | Freq: Two times a day (BID) | ORAL | 0 refills | Status: DC

## 2018-05-24 MED ORDER — HYDROCODONE-ACETAMINOPHEN 5-325 MG PO TABS
1.0000 | ORAL_TABLET | Freq: Once | ORAL | Status: AC
  Administered 2018-05-24: 1 via ORAL
  Filled 2018-05-24: qty 1

## 2018-05-24 MED ORDER — SODIUM CHLORIDE 0.9 % IV BOLUS: 1000.0000 mL | Freq: Once | INTRAVENOUS | Status: DC

## 2018-05-24 NOTE — Discharge Instructions (Signed)
Follow-up with your primary care provider at [facility] if any continued problems or not improving.  You may apply ice or heat to your back as needed for discomfort.  Begin taking naproxen 500 mg twice daily with food and Robaxin 500 mg every 6 hours for muscle spasms. [facility].

## 2018-05-24 NOTE — ED Notes (Signed)
Pt states she just voided, will have to void again soon due to lasix.

## 2018-05-24 NOTE — ED Provider Notes (Signed)
[facility] Emergency Department Provider Note  ____________________________________________   First MD Initiated Contact with Patient 05/24/18 1150     (approximate)  I have reviewed the triage vital signs and the nursing notes.   HISTORY  Chief Complaint Flank Pain   HPI Elizabeth [name] is a 57 y.o. female presents to the emergency department with complaint of left flank pain/back pain that began a few weeks ago.  Patient denies any history of injury.  She denies any urinary symptoms or previous history of kidney stones.  Patient does have a history of chronic low back pain but does not recall any recent injury.  She continues to take her regular medication as prescribed by her doctor.  Currently she rates her pain as 6 out of 10.   Past Medical History:  Diagnosis Date  . Anxiety   . Arm pain 07/25/2015  . CHF (congestive heart failure) (HCC)   . Congestive heart failure (HCC) 07/25/2015  . COPD (chronic obstructive pulmonary disease) (HCC)    never been idagnosed  . Depression   . Lower extremity pain 07/25/2015  . Reflux   . RSD (reflex sympathetic dystrophy)   . Spinal cord stimulator status   . Vitamin B12 deficiency   . Vitamin D deficiency     Patient Active Problem List   Diagnosis Date Noted  . Palliative care by specialist   . Acute respiratory distress   . Respiratory failure (HCC) 04/11/2018  . COPD with acute bronchitis (HCC) 11/23/2017  . Disorder of bone, unspecified 08/30/2017  . Opioid-induced constipation (OIC) 09/15/2016  . Lumbar facet syndrome (Bilateral) 03/18/2016  . 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 Chronic upper extremity pain (Bilateral) 09/23/2015  . Chronic lower extremity pain (Bilateral) 09/23/2015  . Long term prescription opiate use 09/23/2015  . Goals of care, counseling/discussion 09/23/2015  . Elevated sedimentation rate 09/23/2015  . Chronic low back pain 07/25/2015  . Generalized  anxiety disorder 07/25/2015  . Encounter for therapeutic drug level monitoring 07/25/2015  . Long term current use of opiate analgesic 07/25/2015  . Uncomplicated opioid dependence (HCC) 07/25/2015  . Opiate use (30 MME/Day) 07/25/2015  . Chronic pain syndrome 07/25/2015  . Complex regional pain syndrome I of upper limb (Bilateral) 07/25/2015  . Complex regional pain syndrome I of lower limb (Bilateral) 07/25/2015  . Opiate analgesic use agreement exists 07/25/2015  . Presence of spinal cord stimulator 07/25/2015  . COPD exacerbation (HCC) 07/25/2015  . Congestive heart failure (HCC) 07/25/2015  . Vitamin D deficiency 07/25/2015  . Vitamin B12 deficiency 07/25/2015    Past Surgical History:  Procedure Laterality Date  . SPINAL CORD STIMULATOR IMPLANT     x 2    Prior to Admission medications   Medication Sig Start Date End Date Taking? Authorizing Provider  albuterol (PROVENTIL HFA;VENTOLIN HFA) 108 (90 Base) MCG/ACT inhaler Inhale 2 puffs into the lungs every 6 (six) hours as needed for wheezing or shortness of breath. 11/26/17   Enid BaasKalisetti, Radhika, MD  atorvastatin (LIPITOR) 40 MG tablet Take 40 mg by mouth at bedtime.     [provider]  carvedilol (COREG) 3.125 MG tablet Take 1 tablet (3.125 mg total) by mouth 2 (two) times daily with a meal. 04/18/18   Enedina FinnerPatel, Sona, MD  furosemide (LASIX) 20 MG tablet Take 1 tablet (20 mg total) by mouth daily. 04/18/18   Enedina FinnerPatel, Sona, MD  ipratropium-albuterol (DUONEB) 0.5-2.5 (3) MG/3ML SOLN Take 3 mLs by nebulization every 6 (  six) hours as needed (wheezing). 04/18/18   Enedina Finner, MD  Melatonin 1 MG TABS Take 1-5 mg by mouth at bedtime as needed (sleep).     [provider]  methocarbamol (ROBAXIN) 500 MG tablet Take 1 tablet (500 mg total) by mouth every 6 (six) hours as needed for muscle spasms. 05/24/18   Tommi Rumps, PA-C  naproxen (NAPROSYN) 500 MG tablet Take 1 tablet (500 mg total) by mouth 2 (two) times daily with a  meal. 05/24/18   Tommi Rumps, PA-C  omeprazole (PRILOSEC) 20 MG capsule Take 20 mg by mouth daily.     [provider]  oxyCODONE (OXY IR/ROXICODONE) 5 MG immediate release tablet Take 1 tablet (5 mg total) by mouth every 6 (six) hours as needed for severe pain. 08/06/18 09/05/18  Barbette Merino, NP  oxyCODONE (OXY IR/ROXICODONE) 5 MG immediate release tablet Take 1 tablet (5 mg total) by mouth every 6 (six) hours as needed for severe pain. 07/07/18 08/06/18  Barbette Merino, NP  oxyCODONE (OXY IR/ROXICODONE) 5 MG immediate release tablet Take 1 tablet (5 mg total) by mouth every 6 (six) hours as needed for severe pain. 06/07/18 07/07/18  Barbette Merino, NP  polyethylene glycol (MIRALAX / GLYCOLAX) packet Take 17 g by mouth daily. Patient taking differently: Take 17 g by mouth daily as needed for mild constipation or moderate constipation.  11/27/17   Enid Baas, MD  potassium chloride SA (K-DUR,KLOR-CON) 20 MEQ tablet Take 20 mEq by mouth daily.    [provider]    Allergies Lisinopril  Family History  Problem Relation Age of Onset  . Cirrhosis Mother   . Cancer Father     Social History Social History   Tobacco Use  . Smoking status: Former Smoker    Packs/day: 0.50    Types: Cigarettes    Last attempt to quit: 10/16/2017    Years since quitting: 0.6  . Smokeless tobacco: Never Used  . Tobacco comment: Quit 2 years ago  Substance Use Topics  . Alcohol use: No    Alcohol/week: 0.0 standard drinks  . Drug use: No    Review of Systems Constitutional: No fever/chills Cardiovascular: Denies chest pain. Respiratory: Denies shortness of breath. Gastrointestinal: No abdominal pain.  No nausea, no vomiting.  No diarrhea.  No constipation. Genitourinary: Negative for dysuria.  Negative for hematuria.  Positive for left flank pain. Musculoskeletal: Negative for back pain. Skin: Negative for rash. Neurological: Negative for headaches, focal weakness or  numbness. ____________________________________________   PHYSICAL EXAM:  VITAL SIGNS: ED Triage Vitals  Enc Vitals Group     BP --      Pulse Rate 05/24/18 1132 72     Resp 05/24/18 1132 16     Temp 05/24/18 1132 98.4 F (36.9 C)     Temp Source 05/24/18 1132 Oral     SpO2 05/24/18 1132 97 %     Weight 05/24/18 1129 120 lb (54.4 kg)     Height 05/24/18 1129 4\' 9"  (1.448 m)     Head Circumference --      Peak Flow --      Pain Score 05/24/18 1128 6     Pain Loc --      Pain Edu? --      Excl. in GC? --    Constitutional: Alert and oriented. Well appearing and in no acute distress. Eyes: Conjunctivae are normal.  Head: Atraumatic. Neck: No stridor.   Cardiovascular: Normal  rate, regular rhythm. Grossly normal heart sounds.  Good peripheral circulation. Respiratory: Normal respiratory effort.  No retractions. Lungs CTAB. Gastrointestinal: Soft and nontender. No distention.  Positive CVA tenderness. Musculoskeletal: No tenderness on point palpation of the thoracic or lumbar spine.  There is tenderness on palpation of the paravertebral muscles left lower thoracic area without any evidence of injury.  Range of motion is slightly restricted secondary to discomfort.  No active muscle spasms were seen.  Patient did have some positive CVA tenderness to percussion. Neurologic:  Normal speech and language. No gross focal neurologic deficits are appreciated. No gait instability. Skin:  Skin is warm, dry and intact. Psychiatric: Mood and affect are normal. Speech and behavior are normal.  ____________________________________________   LABS (all labs ordered are listed, but only abnormal results are displayed)  Labs Reviewed  URINALYSIS, COMPLETE (UACMP) WITH MICROSCOPIC - Abnormal; Notable for the following components:      Result Value   Color, Urine STRAW (*)    APPearance CLEAR (*)    Leukocytes, UA TRACE (*)    All other components within normal limits     RADIOLOGY  Official radiology report(s): Ct Renal Stone Study  Result Date: 05/24/2018 CLINICAL DATA:  Right flank pain that began a few weeks ago, no hx of kidney stones, denies blood in urine, appears in no distress, does not have hx of kidney infections EXAM: CT ABDOMEN AND PELVIS WITHOUT CONTRAST TECHNIQUE: Multidetector CT imaging of the abdomen and pelvis was performed following the standard protocol without IV contrast. COMPARISON:  09/10/2010 FINDINGS: Lower chest: No acute abnormality. Hepatobiliary: No focal liver abnormality is seen. No intrahepatic or extrahepatic biliary ductal dilatation. High density material within the gallbladder which may reflect tiny cholelithiasis versus gallbladder sludge. Pancreas: Unremarkable. No pancreatic ductal dilatation or surrounding inflammatory changes. Spleen: Normal in size without focal abnormality. Adrenals/Urinary Tract: Adrenal glands are unremarkable. Kidneys are normal, without renal calculi, focal lesion, or hydronephrosis. Bladder is unremarkable. Stomach/Bowel: Stomach is within normal limits. Appendix appears normal. No evidence of bowel wall thickening, distention, or inflammatory changes. Vascular/Lymphatic: Abdominal aortic atherosclerosis. Normal caliber abdominal aorta. No lymphadenopathy. Reproductive: Uterus and bilateral adnexa are unremarkable. Other: No abdominal wall hernia or abnormality. No abdominopelvic ascites. Musculoskeletal: No acute osseous abnormality. No aggressive osseous lesion. Spinal stimulator with the power pack projecting over the left buttock. IMPRESSION: 1. No acute abdominal or pelvic pathology. 2. No urolithiasis or obstructive uropathy. 3. Cholelithiasis versus gallbladder sludge. Electronically Signed   By: Elige Ko   On: 05/24/2018 13:13   ____________________________________________   PROCEDURES  Procedure(s) performed: None  Procedures  Critical Care performed:  No  ____________________________________________   INITIAL IMPRESSION / ASSESSMENT AND PLAN / ED COURSE  As part of my medical decision making, I reviewed the following data within the electronic MEDICAL RECORD NUMBER Notes from prior ED visits and [place] Controlled Substance Database  Patient and family were made aware that CT scan was negative for kidney stone to explain the left flank/muscle pain.  Patient was placed on Robaxin 500 mg 4 times daily as needed for muscle spasms and naproxen 500 mg twice daily.  She is to follow-up with her PCP at [facility].  She is encouraged to use ice or heat to her back as needed for discomfort.  Since test results were negative patient was told that she was being treated for a muscle skeletal pain and she now agrees.  ____________________________________________   FINAL CLINICAL IMPRESSION(S) / ED DIAGNOSES  Final  diagnoses:  Left flank pain     ED Discharge Orders         Ordered    naproxen (NAPROSYN) 500 MG tablet  2 times daily with meals     05/24/18 1327    methocarbamol (ROBAXIN) 500 MG tablet  Every 6 hours PRN     05/24/18 1327           Note:  This document was prepared using Dragon voice recognition software and may include unintentional dictation errors.    Tommi RumpsSummers, Rhonda L, PA-C 05/24/18 1441    Arnaldo NatalMalinda, Paul F, MD 05/24/18 1535

## 2018-05-24 NOTE — ED Notes (Signed)
See triage note  Presents with left flank pain for the past few weeks  States pain  Is non radiating  And denies any fever  Ambulates well to treatment room  Also has swelling to both lower ext  Which she says is normal

## 2018-05-24 NOTE — ED Triage Notes (Signed)
Right flank pain that began a few weeks ago, no hx of kidney stones, denies blood in urine, appears in no distress, does not have hx of kidney infections.

## 2018-05-31 ENCOUNTER — Telehealth: Payer: Self-pay | Admitting: *Deleted

## 2018-05-31 DIAGNOSIS — Z122 Encounter for screening for malignant neoplasm of respiratory organs: Secondary | ICD-10-CM

## 2018-05-31 NOTE — Telephone Encounter (Signed)
Received a referral for initial lung cancer screening scan.  Contacted the patient and obtained their smoking history, former smoker quit 04-11-18, 40pkyr history  as well as answering questions related to screening process.  Patient denies signs of lung cancer such as weight loss or hemoptysis at this time.  Patient denies comorbidity that would prevent curative treatment if lung cancer were found.  Patient is scheduled for the Shared Decision Making Visit and CT scan on 06-21-18 at 2pm.

## 2018-06-20 ENCOUNTER — Encounter: Payer: Self-pay | Admitting: Oncology

## 2018-06-21 ENCOUNTER — Encounter (INDEPENDENT_AMBULATORY_CARE_PROVIDER_SITE_OTHER): Payer: Self-pay

## 2018-06-21 ENCOUNTER — Ambulatory Visit
Admission: RE | Admit: 2018-06-21 | Discharge: 2018-06-21 | Disposition: A | Discharge status: Home / Self Care | Payer: Medicaid Other | Source: Ambulatory Visit | Attending: Oncology | Admitting: Oncology

## 2018-06-21 ENCOUNTER — Inpatient Hospital Stay: Payer: Medicaid Other | Attending: Oncology | Admitting: Oncology

## 2018-06-21 ENCOUNTER — Inpatient Hospital Stay: Admission: RE | Admit: 2018-06-21 | Payer: Medicaid Other | Source: Ambulatory Visit

## 2018-06-21 DIAGNOSIS — Z122 Encounter for screening for malignant neoplasm of respiratory organs: Secondary | ICD-10-CM | POA: Diagnosis not present

## 2018-06-21 DIAGNOSIS — I700 Atherosclerosis of aorta: Secondary | ICD-10-CM | POA: Diagnosis not present

## 2018-06-21 DIAGNOSIS — R918 Other nonspecific abnormal finding of lung field: Secondary | ICD-10-CM | POA: Diagnosis not present

## 2018-06-21 DIAGNOSIS — J438 Other emphysema: Secondary | ICD-10-CM | POA: Diagnosis not present

## 2018-06-21 DIAGNOSIS — J432 Centrilobular emphysema: Secondary | ICD-10-CM | POA: Diagnosis not present

## 2018-06-21 DIAGNOSIS — Z87891 Personal history of nicotine dependence: Secondary | ICD-10-CM | POA: Diagnosis not present

## 2018-06-21 DIAGNOSIS — I2510 Atherosclerotic heart disease of native coronary artery without angina pectoris: Secondary | ICD-10-CM | POA: Insufficient documentation

## 2018-06-21 NOTE — Progress Notes (Signed)
In accordance with CMS guidelines, patient has met eligibility criteria including age, absence of signs or symptoms of lung cancer.  Social History   Tobacco Use  . Smoking status: Former Smoker    Packs/day: 1.00    Years: 40.00    Pack years: 40.00    Types: Cigarettes    Last attempt to quit: 10/16/2017    Years since quitting: 0.6  . Smokeless tobacco: Never Used  . Tobacco comment: Quit 2 years ago  Substance Use Topics  . Alcohol use: No    Alcohol/week: 0.0 standard drinks  . Drug use: No     A shared decision-making session was conducted prior to the performance of CT scan. This includes one or more decision aids, includes benefits and harms of screening, follow-up diagnostic testing, over-diagnosis, false positive rate, and total radiation exposure.  Counseling on the importance of adherence to annual lung cancer LDCT screening, impact of co-morbidities, and ability or willingness to undergo diagnosis and treatment is imperative for compliance of the program.  Counseling on the importance of continued smoking cessation for former smokers; the importance of smoking cessation for current smokers, and information about tobacco cessation interventions have been given to patient including [place] and [place] [facility] [place] programs.  Written order for lung cancer screening with LDCT has been given to the patient and any and all questions have been answered to the best of my abilities.   Yearly follow up will be coordinated by Burgess Estelle, Thoracic Navigator.  Faythe Casa, NP 06/21/2018 1:13 PM

## 2018-06-24 ENCOUNTER — Telehealth: Payer: Self-pay | Admitting: *Deleted

## 2018-06-24 NOTE — Telephone Encounter (Signed)
Notified patient of LDCT lung cancer screening program results with recommendation for 12 month follow up imaging. Also notified of incidental findings noted below and is encouraged to discuss further with PCP who will receive a copy of this note and/or the CT report. Patient verbalizes understanding.   IMPRESSION: 1. Lung-RADS 2S, benign appearance or behavior. Continue annual screening with low-dose chest CT without contrast in 12 months. 2. The "S" modifier above refers to potentially clinically significant non lung cancer related findings. Specifically, there is aortic atherosclerosis, in addition to left main and 3 vessel coronary artery disease. Please note that although the presence of coronary artery calcium documents the presence of coronary artery disease, the severity of this disease and any potential stenosis cannot be assessed on this non-gated CT examination. Assessment for potential risk factor modification, dietary therapy or pharmacologic therapy may be warranted, if clinically indicated. 3. Mild diffuse bronchial wall thickening with mild centrilobular and paraseptal emphysema; imaging findings suggestive of underlying COPD.  Aortic Atherosclerosis (ICD10-I70.0) and Emphysema (ICD10-J43.9). 

## 2018-07-13 ENCOUNTER — Other Ambulatory Visit: Payer: Self-pay | Admitting: Nurse Practitioner

## 2018-07-13 DIAGNOSIS — R609 Edema, unspecified: Secondary | ICD-10-CM

## 2018-08-14 ENCOUNTER — Inpatient Hospital Stay
Admission: EM | Admit: 2018-08-14 | Discharge: 2018-08-18 | DRG: 291 | Disposition: A | Discharge status: Home / Self Care | Payer: Medicaid Other | Attending: Internal Medicine | Admitting: Internal Medicine

## 2018-08-14 ENCOUNTER — Encounter: Payer: Self-pay | Admitting: Intensive Care

## 2018-08-14 ENCOUNTER — Other Ambulatory Visit: Payer: Self-pay

## 2018-08-14 ENCOUNTER — Emergency Department: Payer: Medicaid Other

## 2018-08-14 DIAGNOSIS — G9050 Complex regional pain syndrome I, unspecified: Secondary | ICD-10-CM | POA: Diagnosis present

## 2018-08-14 DIAGNOSIS — J9601 Acute respiratory failure with hypoxia: Secondary | ICD-10-CM | POA: Diagnosis present

## 2018-08-14 DIAGNOSIS — Z79899 Other long term (current) drug therapy: Secondary | ICD-10-CM

## 2018-08-14 DIAGNOSIS — G4700 Insomnia, unspecified: Secondary | ICD-10-CM | POA: Diagnosis present

## 2018-08-14 DIAGNOSIS — Z888 Allergy status to other drugs, medicaments and biological substances status: Secondary | ICD-10-CM

## 2018-08-14 DIAGNOSIS — R0602 Shortness of breath: Secondary | ICD-10-CM | POA: Diagnosis present

## 2018-08-14 DIAGNOSIS — I110 Hypertensive heart disease with heart failure: Principal | ICD-10-CM | POA: Diagnosis present

## 2018-08-14 DIAGNOSIS — I5043 Acute on chronic combined systolic (congestive) and diastolic (congestive) heart failure: Secondary | ICD-10-CM | POA: Diagnosis present

## 2018-08-14 DIAGNOSIS — J441 Chronic obstructive pulmonary disease with (acute) exacerbation: Secondary | ICD-10-CM | POA: Diagnosis present

## 2018-08-14 DIAGNOSIS — E538 Deficiency of other specified B group vitamins: Secondary | ICD-10-CM | POA: Diagnosis present

## 2018-08-14 DIAGNOSIS — K219 Gastro-esophageal reflux disease without esophagitis: Secondary | ICD-10-CM | POA: Diagnosis present

## 2018-08-14 DIAGNOSIS — Z9682 Presence of neurostimulator: Secondary | ICD-10-CM | POA: Diagnosis not present

## 2018-08-14 DIAGNOSIS — E559 Vitamin D deficiency, unspecified: Secondary | ICD-10-CM | POA: Diagnosis present

## 2018-08-14 DIAGNOSIS — Z87891 Personal history of nicotine dependence: Secondary | ICD-10-CM | POA: Diagnosis not present

## 2018-08-14 DIAGNOSIS — Z809 Family history of malignant neoplasm, unspecified: Secondary | ICD-10-CM | POA: Diagnosis not present

## 2018-08-14 LAB — COMPREHENSIVE METABOLIC PANEL
ALT: 19 U/L (ref 0–44)
ANION GAP: 11 (ref 5–15)
AST: 22 U/L (ref 15–41)
Albumin: 4.1 g/dL (ref 3.5–5.0)
Alkaline Phosphatase: 121 U/L (ref 38–126)
BILIRUBIN TOTAL: 0.6 mg/dL (ref 0.3–1.2)
BUN: 14 mg/dL (ref 6–20)
CALCIUM: 9.1 mg/dL (ref 8.9–10.3)
CO2: 23 mmol/L (ref 22–32)
CREATININE: 0.73 mg/dL (ref 0.44–1.00)
Chloride: 106 mmol/L (ref 98–111)
GFR calc non Af Amer: >60 mL/min (ref >60)
Glucose, Bld: 124 mg/dL — ABNORMAL HIGH (ref 70–99)
Potassium: 3.8 mmol/L (ref 3.5–5.1)
Sodium: 140 mmol/L (ref 135–145)
TOTAL PROTEIN: 7.2 g/dL (ref 6.5–8.1)

## 2018-08-14 LAB — CBC WITH DIFFERENTIAL/PLATELET
ABS IMMATURE GRANULOCYTES: 0.01 10*3/uL (ref 0.00–0.07)
Basophils Absolute: 0 10*3/uL (ref 0.0–0.1)
Basophils Relative: 1 %
EOS PCT: 1 %
Eosinophils Absolute: 0.1 10*3/uL (ref 0.0–0.5)
HEMATOCRIT: 38.5 % (ref 36.0–46.0)
Hemoglobin: 12.9 g/dL (ref 12.0–15.0)
Immature Granulocytes: 0 %
LYMPHS ABS: 2.1 10*3/uL (ref 0.7–4.0)
LYMPHS PCT: 43 %
MCH: 29.7 pg (ref 26.0–34.0)
MCHC: 33.5 g/dL (ref 30.0–36.0)
MCV: 88.5 fL (ref 80.0–100.0)
MONO ABS: 0.5 10*3/uL (ref 0.1–1.0)
MONOS PCT: 10 %
NEUTROS ABS: 2.3 10*3/uL (ref 1.7–7.7)
Neutrophils Relative %: 45 %
Platelets: 211 10*3/uL (ref 150–400)
RBC: 4.35 MIL/uL (ref 3.87–5.11)
RDW: 12.6 % (ref 11.5–15.5)
WBC: 5 10*3/uL (ref 4.0–10.5)
nRBC: 0 % (ref 0.0–0.2)

## 2018-08-14 LAB — BRAIN NATRIURETIC PEPTIDE: B Natriuretic Peptide: 19 pg/mL (ref 0.0–100.0)

## 2018-08-14 MED ORDER — ATORVASTATIN CALCIUM 20 MG PO TABS
40.0000 mg | ORAL_TABLET | Freq: Every day | ORAL | Status: DC
  Administered 2018-08-14 – 2018-08-17 (×4): 40 mg via ORAL
  Filled 2018-08-14 (×4): qty 2

## 2018-08-14 MED ORDER — PANTOPRAZOLE SODIUM 40 MG PO TBEC
40.0000 mg | DELAYED_RELEASE_TABLET | Freq: Every day | ORAL | Status: DC
  Administered 2018-08-15 – 2018-08-18 (×4): 40 mg via ORAL
  Filled 2018-08-14 (×4): qty 1

## 2018-08-14 MED ORDER — ALBUTEROL SULFATE (2.5 MG/3ML) 0.083% IN NEBU: 2.5000 mg | INHALATION_SOLUTION | RESPIRATORY_TRACT | Status: DC | PRN

## 2018-08-14 MED ORDER — POTASSIUM CHLORIDE CRYS ER 20 MEQ PO TBCR
20.0000 meq | EXTENDED_RELEASE_TABLET | Freq: Every day | ORAL | Status: DC
  Administered 2018-08-15 – 2018-08-18 (×4): 20 meq via ORAL
  Filled 2018-08-14 (×4): qty 1

## 2018-08-14 MED ORDER — SODIUM CHLORIDE 0.9% FLUSH
3.0000 mL | Freq: Two times a day (BID) | INTRAVENOUS | Status: DC
  Administered 2018-08-14 – 2018-08-18 (×8): 3 mL via INTRAVENOUS

## 2018-08-14 MED ORDER — NAPROXEN 500 MG PO TABS
500.0000 mg | ORAL_TABLET | Freq: Two times a day (BID) | ORAL | Status: DC
  Filled 2018-08-14 (×2): qty 1

## 2018-08-14 MED ORDER — ONDANSETRON HCL 4 MG PO TABS: 4.0000 mg | ORAL_TABLET | Freq: Four times a day (QID) | ORAL | Status: DC | PRN

## 2018-08-14 MED ORDER — IPRATROPIUM-ALBUTEROL 0.5-2.5 (3) MG/3ML IN SOLN
9.0000 mL | Freq: Once | RESPIRATORY_TRACT | Status: AC
  Administered 2018-08-14: 9 mL via RESPIRATORY_TRACT
  Filled 2018-08-14: qty 9

## 2018-08-14 MED ORDER — IPRATROPIUM-ALBUTEROL 0.5-2.5 (3) MG/3ML IN SOLN
3.0000 mL | RESPIRATORY_TRACT | Status: DC
  Administered 2018-08-14 – 2018-08-15 (×3): 3 mL via RESPIRATORY_TRACT
  Filled 2018-08-14 (×3): qty 3

## 2018-08-14 MED ORDER — METHYLPREDNISOLONE SODIUM SUCC 125 MG IJ SOLR
125.0000 mg | Freq: Once | INTRAMUSCULAR | Status: AC
  Administered 2018-08-14: 125 mg via INTRAVENOUS
  Filled 2018-08-14: qty 2

## 2018-08-14 MED ORDER — POLYETHYLENE GLYCOL 3350 17 G PO PACK: 17.0000 g | PACK | Freq: Every day | ORAL | Status: DC | PRN

## 2018-08-14 MED ORDER — CARVEDILOL 3.125 MG PO TABS
3.1250 mg | ORAL_TABLET | Freq: Two times a day (BID) | ORAL | Status: DC
  Administered 2018-08-15 – 2018-08-18 (×7): 3.125 mg via ORAL
  Filled 2018-08-14 (×7): qty 1

## 2018-08-14 MED ORDER — LORAZEPAM 2 MG/ML IJ SOLN
1.0000 mg | Freq: Once | INTRAMUSCULAR | Status: AC
  Administered 2018-08-14: 1 mg via INTRAVENOUS

## 2018-08-14 MED ORDER — ENOXAPARIN SODIUM 40 MG/0.4ML ~~LOC~~ SOLN
40.0000 mg | SUBCUTANEOUS | Status: DC
  Administered 2018-08-14 – 2018-08-17 (×4): 40 mg via SUBCUTANEOUS
  Filled 2018-08-14 (×4): qty 0.4

## 2018-08-14 MED ORDER — ALBUTEROL SULFATE (2.5 MG/3ML) 0.083% IN NEBU
10.0000 mg | INHALATION_SOLUTION | Freq: Once | RESPIRATORY_TRACT | Status: AC
  Administered 2018-08-14: 10 mg via RESPIRATORY_TRACT

## 2018-08-14 MED ORDER — POLYETHYLENE GLYCOL 3350 17 G PO PACK
17.0000 g | PACK | Freq: Every day | ORAL | Status: DC
  Administered 2018-08-15 – 2018-08-18 (×3): 17 g via ORAL
  Filled 2018-08-14 (×4): qty 1

## 2018-08-14 MED ORDER — ACETAMINOPHEN 650 MG RE SUPP: 650.0000 mg | Freq: Four times a day (QID) | RECTAL | Status: DC | PRN

## 2018-08-14 MED ORDER — OXYCODONE HCL 5 MG PO TABS
5.0000 mg | ORAL_TABLET | Freq: Four times a day (QID) | ORAL | Status: DC | PRN
  Administered 2018-08-14 – 2018-08-18 (×13): 5 mg via ORAL
  Filled 2018-08-14 (×13): qty 1

## 2018-08-14 MED ORDER — SODIUM CHLORIDE 0.9 % IV SOLN
500.0000 mg | Freq: Once | INTRAVENOUS | Status: AC
  Administered 2018-08-14: 500 mg via INTRAVENOUS
  Filled 2018-08-14: qty 500

## 2018-08-14 MED ORDER — MELATONIN 5 MG PO TABS
5.0000 mg | ORAL_TABLET | Freq: Every evening | ORAL | Status: DC | PRN
  Administered 2018-08-14 – 2018-08-15 (×2): 5 mg via ORAL
  Filled 2018-08-14 (×3): qty 1

## 2018-08-14 MED ORDER — FUROSEMIDE 10 MG/ML IJ SOLN: 40.0000 mg | Freq: Once | INTRAMUSCULAR | Status: DC

## 2018-08-14 MED ORDER — MAGNESIUM SULFATE 2 GM/50ML IV SOLN
2.0000 g | Freq: Once | INTRAVENOUS | Status: AC
  Administered 2018-08-14: 2 g via INTRAVENOUS
  Filled 2018-08-14: qty 50

## 2018-08-14 MED ORDER — ALBUTEROL SULFATE (2.5 MG/3ML) 0.083% IN NEBU
5.0000 mg | INHALATION_SOLUTION | Freq: Once | RESPIRATORY_TRACT | Status: AC
  Administered 2018-08-14: 5 mg via RESPIRATORY_TRACT
  Filled 2018-08-14: qty 6

## 2018-08-14 MED ORDER — LORAZEPAM 2 MG/ML IJ SOLN
INTRAMUSCULAR | Status: AC
  Administered 2018-08-14: 1 mg via INTRAVENOUS
  Filled 2018-08-14: qty 1

## 2018-08-14 MED ORDER — ACETAMINOPHEN 325 MG PO TABS: 650.0000 mg | ORAL_TABLET | Freq: Four times a day (QID) | ORAL | Status: DC | PRN

## 2018-08-14 MED ORDER — METHYLPREDNISOLONE SODIUM SUCC 125 MG IJ SOLR
60.0000 mg | Freq: Two times a day (BID) | INTRAMUSCULAR | Status: DC
  Administered 2018-08-15 – 2018-08-18 (×7): 60 mg via INTRAVENOUS
  Filled 2018-08-14 (×7): qty 2

## 2018-08-14 MED ORDER — METHOCARBAMOL 500 MG PO TABS
500.0000 mg | ORAL_TABLET | Freq: Four times a day (QID) | ORAL | Status: DC | PRN
  Administered 2018-08-16: 23:00:00 500 mg via ORAL
  Filled 2018-08-14 (×2): qty 1

## 2018-08-14 MED ORDER — ONDANSETRON HCL 4 MG/2ML IJ SOLN: 4.0000 mg | Freq: Four times a day (QID) | INTRAMUSCULAR | Status: DC | PRN

## 2018-08-14 MED ORDER — FUROSEMIDE 10 MG/ML IJ SOLN
40.0000 mg | Freq: Two times a day (BID) | INTRAMUSCULAR | Status: DC
  Administered 2018-08-14 – 2018-08-15 (×3): 40 mg via INTRAVENOUS
  Filled 2018-08-14 (×3): qty 4

## 2018-08-14 MED ORDER — ALBUTEROL SULFATE (2.5 MG/3ML) 0.083% IN NEBU
INHALATION_SOLUTION | RESPIRATORY_TRACT | Status: AC
  Filled 2018-08-14: qty 12

## 2018-08-14 NOTE — ED Notes (Signed)
First Nurse Note: Pt to ED c/o ShOB, pt has COPD. Has been having ShOB since yesterday. Pt has used inhalers without relief. Pt labored and has audible wheezes.

## 2018-08-14 NOTE — ED Provider Notes (Addendum)
[facility] Emergency Department Provider Note  ____________________________________________   I have reviewed the triage vital signs and the nursing notes.   HISTORY  Chief Complaint Shortness of Breath   History limited by: Not Limited   HPI Elizabeth [name] is a 57 y.o. female who presents to the emergency department today because of concerns for shortness of breath.  Patient states she has a history of CHF and COPD.  She states she quit smoking 3 months ago.  Starting yesterday she was having increased shortness of breath.  She has been trying her home breathing treatments without any significant relief.  She has had somewhat constant leg swelling.  She has had central chest tightness.  She feels like she wants to bring something up with her cough however is unable to.  The patient denies any fevers.   Per medical record review patient has a history of COPD, CHF  Past Medical History:  Diagnosis Date  . Anxiety   . Arm pain 07/25/2015  . CHF (congestive heart failure) (HCC)   . Congestive heart failure (HCC) 07/25/2015  . COPD (chronic obstructive pulmonary disease) (HCC)    never been idagnosed  . Depression   . Lower extremity pain 07/25/2015  . Reflux   . RSD (reflex sympathetic dystrophy)   . Spinal cord stimulator status   . Vitamin B12 deficiency   . Vitamin D deficiency     Patient Active Problem List   Diagnosis Date Noted  . Palliative care by specialist   . Acute respiratory distress   . Respiratory failure (HCC) 04/11/2018  . COPD with acute bronchitis (HCC) 11/23/2017  . Disorder of bone, unspecified 08/30/2017  . Opioid-induced constipation (OIC) 09/15/2016  . Lumbar facet syndrome (Bilateral) 03/18/2016  . 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 Chronic upper extremity pain (Bilateral) 09/23/2015  . Chronic lower extremity pain (Bilateral) 09/23/2015  . Long term prescription opiate use 09/23/2015  . Goals of  care, counseling/discussion 09/23/2015  . Elevated sedimentation rate 09/23/2015  . Chronic low back pain 07/25/2015  . Generalized anxiety disorder 07/25/2015  . Encounter for therapeutic drug level monitoring 07/25/2015  . Long term current use of opiate analgesic 07/25/2015  . Uncomplicated opioid dependence (HCC) 07/25/2015  . Opiate use (30 MME/Day) 07/25/2015  . Chronic pain syndrome 07/25/2015  . Complex regional pain syndrome I of upper limb (Bilateral) 07/25/2015  . Complex regional pain syndrome I of lower limb (Bilateral) 07/25/2015  . Opiate analgesic use agreement exists 07/25/2015  . Presence of spinal cord stimulator 07/25/2015  . COPD exacerbation (HCC) 07/25/2015  . Congestive heart failure (HCC) 07/25/2015  . Vitamin D deficiency 07/25/2015  . Vitamin B12 deficiency 07/25/2015    Past Surgical History:  Procedure Laterality Date  . SPINAL CORD STIMULATOR IMPLANT     x 2    Prior to Admission medications   Medication Sig Start Date End Date Taking? Authorizing Provider  albuterol (PROVENTIL HFA;VENTOLIN HFA) 108 (90 Base) MCG/ACT inhaler Inhale 2 puffs into the lungs every 6 (six) hours as needed for wheezing or shortness of breath. 11/26/17   Enid Baas, MD  atorvastatin (LIPITOR) 40 MG tablet Take 40 mg by mouth at bedtime.     [provider]  carvedilol (COREG) 3.125 MG tablet Take 1 tablet (3.125 mg total) by mouth 2 (two) times daily with a meal. 04/18/18   Enedina Finner, MD  furosemide (LASIX) 20 MG tablet Take 1 tablet (20 mg total) by mouth  daily. 04/18/18   Enedina Finner, MD  ipratropium-albuterol (DUONEB) 0.5-2.5 (3) MG/3ML SOLN Take 3 mLs by nebulization every 6 (six) hours as needed (wheezing). 04/18/18   Enedina Finner, MD  Melatonin 1 MG TABS Take 1-5 mg by mouth at bedtime as needed (sleep).     [provider]  methocarbamol (ROBAXIN) 500 MG tablet Take 1 tablet (500 mg total) by mouth every 6 (six) hours as needed for muscle spasms.  05/24/18   Tommi Rumps, PA-C  naproxen (NAPROSYN) 500 MG tablet Take 1 tablet (500 mg total) by mouth 2 (two) times daily with a meal. 05/24/18   Tommi Rumps, PA-C  omeprazole (PRILOSEC) 20 MG capsule Take 20 mg by mouth daily.     [provider]  oxyCODONE (OXY IR/ROXICODONE) 5 MG immediate release tablet Take 1 tablet (5 mg total) by mouth every 6 (six) hours as needed for severe pain. 08/06/18 09/05/18  Barbette Merino, NP  polyethylene glycol (MIRALAX / GLYCOLAX) packet Take 17 g by mouth daily. Patient taking differently: Take 17 g by mouth daily as needed for mild constipation or moderate constipation.  11/27/17   Enid Baas, MD  potassium chloride SA (K-DUR,KLOR-CON) 20 MEQ tablet Take 20 mEq by mouth daily.    [provider]    Allergies Lisinopril  Family History  Problem Relation Age of Onset  . Cirrhosis Mother   . Cancer Father     Social History Social History   Tobacco Use  . Smoking status: Former Smoker    Packs/day: 1.00    Years: 40.00    Pack years: 40.00    Types: Cigarettes    Last attempt to quit: 10/16/2017    Years since quitting: 0.8  . Smokeless tobacco: Never Used  . Tobacco comment: Quit 2 years ago  Substance Use Topics  . Alcohol use: No    Alcohol/week: 0.0 standard drinks  . Drug use: No    Review of Systems Constitutional: No fever/chills Eyes: No visual changes. ENT: No sore throat. Cardiovascular: Positive for chest pain Respiratory: Positive for shortness of breath. Gastrointestinal: No abdominal pain.  No nausea, no vomiting.  No diarrhea.   Genitourinary: Negative for dysuria. Musculoskeletal: Positive for leg swelling Skin: Negative for rash. Neurological: Negative for headaches, focal weakness or numbness.  ____________________________________________   PHYSICAL EXAM:  VITAL SIGNS: ED Triage Vitals  Enc Vitals Group     BP 08/14/18 1413 137/86     Pulse Rate 08/14/18 1413 75     Resp  08/14/18 1413 20     Temp 08/14/18 1413 98.3 F (36.8 C)     Temp Source 08/14/18 1413 Oral     SpO2 08/14/18 1413 96 %     Weight 08/14/18 1418 126 lb (57.2 kg)     Height 08/14/18 1418 4\' 9"  (1.448 m)     Head Circumference --      Peak Flow --      Pain Score 08/14/18 1413 7   Constitutional: Alert and oriented.  Eyes: Conjunctivae are normal.  ENT      Head: Normocephalic and atraumatic.      Nose: No congestion/rhinnorhea.      Mouth/Throat: Mucous membranes are moist.      Neck: No stridor. Hematological/Lymphatic/Immunilogical: No cervical lymphadenopathy. Cardiovascular: Normal rate, regular rhythm.  No murmurs, rubs, or gallops. Respiratory: Increased respiratory effort with diffuse expiratory wheezing. Gastrointestinal: Soft and non tender. No rebound. No guarding.  Genitourinary: Deferred Musculoskeletal: Normal  range of motion in all extremities.  1+ bilateral lower extremity swelling Neurologic:  Normal speech and language. No gross focal neurologic deficits are appreciated.  Skin:  Skin is warm, dry and intact. No rash noted. Psychiatric: Mood and affect are normal. Speech and behavior are normal. Patient exhibits appropriate insight and judgment.  ____________________________________________    LABS (pertinent positives/negatives)  CBC wnl CMP wnl except glu 124  ____________________________________________   EKG  I, Phineas Semen, attending physician, personally viewed and interpreted this EKG  EKG Time: 0217 Rate: 74 Rhythm: normal sinus rhythm Axis: normal Intervals: qtc 468 QRS: Incomplete RBBB ST changes: no st elevation Impression: abnormal ekg  ____________________________________________    RADIOLOGY  CXR No acute disease  ____________________________________________   PROCEDURES  Procedures  CRITICAL CARE Performed by: Phineas Semen   Total critical care time: 35 minutes  Critical care time was exclusive of  separately billable procedures and treating other patients.  Critical care was necessary to treat or prevent imminent or life-threatening deterioration.  Critical care was time spent personally by me on the following activities: development of treatment plan with patient and/or surrogate as well as nursing, discussions with consultants, evaluation of patient's response to treatment, examination of patient, obtaining history from patient or surrogate, ordering and performing treatments and interventions, ordering and review of laboratory studies, ordering and review of radiographic studies, pulse oximetry and re-evaluation of patient's condition.  ____________________________________________   INITIAL IMPRESSION / ASSESSMENT AND PLAN / ED COURSE  Pertinent labs & imaging results that were available during my care of the patient were reviewed by me and considered in my medical decision making (see chart for details).   Patient presented to the emergency department today because of concerns for shortness of breath.  A history of COPD and CHF and those of both been on the differential as well as ACS, anemia, infection.  Patient's chest x-ray without any edema or signs of infection.  No pneumothorax.  Patient had diffuse wheezing on auscultation.  At this point I think COPD likely. Initially patient was placed on bipap and given breathing treatments. She was able to be taken off the bipap. Will plan on admission.   ____________________________________________   FINAL CLINICAL IMPRESSION(S) / ED DIAGNOSES  Final diagnoses:  COPD exacerbation (HCC)  SOB (shortness of breath)     Note: This dictation was prepared with Dragon dictation. Any transcriptional errors that result from this process are unintentional     Phineas Semen, MD 08/14/18 Joya Martyr, MD 08/24/18 [phone]

## 2018-08-14 NOTE — Progress Notes (Signed)
Bipap refused 

## 2018-08-14 NOTE — ED Notes (Signed)
Call to floor - pt leaving room att

## 2018-08-14 NOTE — ED Notes (Addendum)
Admitting Provider at bedside.  Pt reports recent admission at ICU for 8 days with bipap, hx of smoking, audible wheeze, CHF at 45%  No PNX, COPD exacerbation

## 2018-08-14 NOTE — ED Notes (Signed)
Humidifier added to [place] set up

## 2018-08-14 NOTE — ED Notes (Signed)
Pt requesting humidified air d/t [place] hurting, RT called

## 2018-08-14 NOTE — ED Triage Notes (Signed)
Patient c/o SOB and CP since yesterday. Reports she cannot lay down because then she cannot breathe. Wheezing/ tightness noted. A&O x4

## 2018-08-14 NOTE — ED Notes (Signed)
ED TO INPATIENT HANDOFF REPORT  Name/Age/Gender Elizabeth [name] 57 y.o. female  Code Status    Code Status Orders  (From admission, onward)         Start     Ordered   08/14/18 1957  Full code  Continuous     08/14/18 1957        Code Status History    Date Active Date Inactive Code Status Order ID Comments User Context   04/11/2018 1726 04/18/2018 2042 Full Code [id number]  Saundra Shelling, MD Inpatient   11/23/2017 2050 11/26/2017 2224 Full Code [id number]  Dustin Flock, MD ED      Home/SNF/Other Home  Chief Complaint history COPD  Level of Care/Admitting Diagnosis ED Disposition    ED Disposition Condition [place]: [place] [[id number]]  Level of Care: Med-Surg [16]  Diagnosis: COPD exacerbation [facility]) [[id number]]  Admitting Physician: Hillary Bow [[id number]]  Attending Physician: Hillary Bow [[id number]]  Estimated length of stay: past midnight tomorrow  Certification:: I certify this patient will need inpatient services for at least 2 midnights  PT Class (Do Not Modify): Inpatient [101]  PT Acc Code (Do Not Modify): Private [1]       Medical History Past Medical History:  Diagnosis Date  . Anxiety   . Arm pain 07/25/2015  . CHF (congestive heart failure) ([place])   . Congestive heart failure ([place]) 07/25/2015  . COPD (chronic obstructive pulmonary disease) ([place])    never been idagnosed  . Depression   . Lower extremity pain 07/25/2015  . Reflux   . RSD (reflex sympathetic dystrophy)   . Spinal cord stimulator status   . Vitamin B12 deficiency   . Vitamin D deficiency     Allergies Allergies  Allergen Reactions  . Lisinopril Anaphylaxis    IV Location/Drains/Wounds Patient Lines/Drains/Airways Status   Active Line/Drains/Airways    Name:   Placement date:   Placement time:   Site:   Days:   Peripheral IV 08/14/18 Right Antecubital   08/14/18    1454    Antecubital   less than 1           Labs/Imaging Results for orders placed or performed during the hospital encounter of 08/14/18 (from the past 48 hour(s))  Brain natriuretic peptide     Status: None   Collection Time: 08/14/18  2:29 PM  Result Value Ref Range   B Natriuretic Peptide 19.0 0.0 - 100.0 pg/mL    Comment: Performed at [facility], [address] [place], [place] [address]  CBC with Differential     Status: None   Collection Time: 08/14/18  2:29 PM  Result Value Ref Range   WBC 5.0 4.0 - 10.5 K/uL   RBC 4.35 3.87 - 5.11 MIL/uL   Hemoglobin 12.9 12.0 - 15.0 g/dL   HCT 38.5 36.0 - 46.0 %   MCV 88.5 80.0 - 100.0 fL   MCH 29.7 26.0 - 34.0 pg   MCHC 33.5 30.0 - 36.0 g/dL   RDW 12.6 11.5 - 15.5 %   Platelets 211 150 - 400 K/uL   nRBC 0.0 0.0 - 0.2 %   Neutrophils Relative % 45 %   Neutro Abs 2.3 1.7 - 7.7 K/uL   Lymphocytes Relative 43 %   Lymphs Abs 2.1 0.7 - 4.0 K/uL   Monocytes Relative 10 %   Monocytes Absolute 0.5 0.1 - 1.0 K/uL   Eosinophils Relative 1 %   Eosinophils  Absolute 0.1 0.0 - 0.5 K/uL   Basophils Relative 1 %   Basophils Absolute 0.0 0.0 - 0.1 K/uL   Immature Granulocytes 0 %   Abs Immature Granulocytes 0.01 0.00 - 0.07 K/uL    Comment: Performed at [facility], [address] [place], [place] [address]  Comprehensive metabolic panel     Status: Abnormal   Collection Time: 08/14/18  2:29 PM  Result Value Ref Range   Sodium 140 135 - 145 mmol/L   Potassium 3.8 3.5 - 5.1 mmol/L   Chloride 106 98 - 111 mmol/L   CO2 23 22 - 32 mmol/L   Glucose, Bld 124 (H) 70 - 99 mg/dL   BUN 14 6 - 20 mg/dL   Creatinine, Ser 0.73 0.44 - 1.00 mg/dL   Calcium 9.1 8.9 - 10.3 mg/dL   Total Protein 7.2 6.5 - 8.1 g/dL   Albumin 4.1 3.5 - 5.0 g/dL   AST 22 15 - 41 U/L   ALT 19 0 - 44 U/L   Alkaline Phosphatase 121 38 - 126 U/L   Total Bilirubin 0.6 0.3 - 1.2 mg/dL   GFR calc non Af Amer >60 >60 mL/min   GFR calc Af Amer >60 >60 mL/min    Comment: (NOTE) The eGFR has been  calculated using the CKD EPI equation. This calculation has not been validated in all clinical situations. eGFR's persistently <60 mL/min signify possible Chronic Kidney Disease.    Anion gap 11 5 - 15    Comment: Performed at [facility], [address] [place], [place] [address]   Dg Chest Portable 1 View  Result Date: 08/14/2018 CLINICAL DATA:  Mid chest pain, wheezing, tightness EXAM: PORTABLE CHEST 1 VIEW COMPARISON:  CT chest 06/21/2018 FINDINGS: There is no focal parenchymal opacity. There is no pleural effusion or pneumothorax. The heart and mediastinal contours are unremarkable. The osseous structures are unremarkable. IMPRESSION: No active disease. Electronically Signed   By: Kathreen Devoid   On: 08/14/2018 15:05    Pending Labs Unresulted Labs (From admission, onward)    Start     Ordered   08/21/18 0500  Creatinine, serum  (enoxaparin (LOVENOX)    CrCl >/= 30 ml/min)  Weekly,   STAT    Comments:  while on enoxaparin therapy    08/14/18 1957          Vitals/Pain Today's Vitals   08/14/18 1925 08/14/18 1930 08/14/18 2000 08/14/18 2030  BP: 118/70 114/76 109/70 113/67  Pulse: 87 81 82 80  Resp: _0 Temp:      TempSrc:      SpO2: 99% 99% 99% 98%  Weight:      Height:      PainSc: 4        Isolation Precautions No active isolations  Medications Medications  ipratropium-albuterol (DUONEB) 0.5-2.5 (3) MG/3ML nebulizer solution 3 mL (has no administration in time range)  enoxaparin (LOVENOX) injection 40 mg (has no administration in time range)  sodium chloride flush (NS) 0.9 % injection 3 mL (has no administration in time range)  acetaminophen (TYLENOL) tablet 650 mg (has no administration in time range)    Or  acetaminophen (TYLENOL) suppository 650 mg (has no administration in time range)  polyethylene glycol (MIRALAX / GLYCOLAX) packet 17 g (has no administration in time range)  ondansetron (ZOFRAN) tablet 4 mg (has no administration in  time range)    Or  ondansetron (ZOFRAN) injection 4 mg (has no  administration in time range)  albuterol (PROVENTIL) (2.5 MG/3ML) 0.083% nebulizer solution 2.5 mg (has no administration in time range)  methylPREDNISolone sodium succinate (SOLU-MEDROL) 125 mg/2 mL injection 60 mg (has no administration in time range)  furosemide (LASIX) injection 40 mg (has no administration in time range)  albuterol (PROVENTIL) (2.5 MG/3ML) 0.083% nebulizer solution 5 mg (5 mg Nebulization Given 08/14/18 1420)  ipratropium-albuterol (DUONEB) 0.5-2.5 (3) MG/3ML nebulizer solution 9 mL (9 mLs Nebulization Given 08/14/18 1453)  methylPREDNISolone sodium succinate (SOLU-MEDROL) 125 mg/2 mL injection 125 mg (125 mg Intravenous Given 08/14/18 1453)  albuterol (PROVENTIL) (2.5 MG/3ML) 0.083% nebulizer solution 10 mg (10 mg Nebulization Not Given 08/14/18 1642)  LORazepam (ATIVAN) injection 1 mg (1 mg Intravenous Given 08/14/18 1613)  magnesium sulfate IVPB 2 g 50 mL ( Intravenous Stopped 08/14/18 1949)  azithromycin (ZITHROMAX) 500 mg in sodium chloride 0.9 % 250 mL IVPB (500 mg Intravenous New Bag/Given 08/14/18 1858)    Mobility walks

## 2018-08-14 NOTE — ED Notes (Signed)
ED Provider at bedside. 

## 2018-08-14 NOTE — ED Notes (Signed)
Archie Patten, sister in law, [phone], going home att

## 2018-08-14 NOTE — H&P (Signed)
[facility] - [place] at [facility]   PATIENT NAME: Elizabeth [name]    MR#:  [id number]  DATE OF BIRTH:  Sep 01, 1961  DATE OF ADMISSION:  08/14/2018  PRIMARY CARE PHYSICIAN: Martie Round, NP   REQUESTING/REFERRING PHYSICIAN: Dr. Derrill Kay  CHIEF COMPLAINT:   Chief Complaint  Patient presents with  . Shortness of Breath    HISTORY OF PRESENT ILLNESS:  Elizabeth [name]  is a 57 y.o. female with a known history of systolic CHF, COPD, hypertension, past tobacco use presented to the emergency room due to worsening shortness of breath, wheezing and weakness since yesterday.  Patient was found to have significant wheezing was given IV steroids and nebulizers.  Initially placed on BiPAP and this has been taken off since she seems a little improved.  Patient has dry cough along with orthopnea.  Gained 3 pounds in 2 days.  Takes Lasix oral medication at home.  Also has worsening lower extremity edema.  Chest x-ray shows changes from COPD but nothing acute.  Afebrile.  On 2 L oxygen at this time.  Does not wear home oxygen.  PAST MEDICAL HISTORY:   Past Medical History:  Diagnosis Date  . Anxiety   . Arm pain 07/25/2015  . CHF (congestive heart failure) (HCC)   . Congestive heart failure (HCC) 07/25/2015  . COPD (chronic obstructive pulmonary disease) (HCC)    never been idagnosed  . Depression   . Lower extremity pain 07/25/2015  . Reflux   . RSD (reflex sympathetic dystrophy)   . Spinal cord stimulator status   . Vitamin B12 deficiency   . Vitamin D deficiency     PAST SURGICAL HISTORY:   Past Surgical History:  Procedure Laterality Date  . SPINAL CORD STIMULATOR IMPLANT     x 2    SOCIAL HISTORY:   Social History   Tobacco Use  . Smoking status: Former Smoker    Packs/day: 1.00    Years: 40.00    Pack years: 40.00    Types: Cigarettes    Last attempt to quit: 10/16/2017    Years since quitting: 0.8  . Smokeless tobacco: Never Used  . Tobacco comment: Quit  2 years ago  Substance Use Topics  . Alcohol use: No    Alcohol/week: 0.0 standard drinks    FAMILY HISTORY:   Family History  Problem Relation Age of Onset  . Cirrhosis Mother   . Cancer Father     DRUG ALLERGIES:   Allergies  Allergen Reactions  . Lisinopril Anaphylaxis    REVIEW OF SYSTEMS:   Review of Systems  Constitutional: Positive for chills and malaise/fatigue. Negative for fever.  HENT: Negative for sore throat.   Eyes: Negative for blurred vision, double vision and pain.  Respiratory: Positive for cough, shortness of breath and wheezing. Negative for hemoptysis.   Cardiovascular: Positive for orthopnea and leg swelling. Negative for chest pain and palpitations.  Gastrointestinal: Negative for abdominal pain, constipation, diarrhea, heartburn, nausea and vomiting.  Genitourinary: Negative for dysuria and hematuria.  Musculoskeletal: Negative for back pain and joint pain.  Skin: Negative for rash.  Neurological: Negative for sensory change, speech change, focal weakness and headaches.  Endo/Heme/Allergies: Does not bruise/bleed easily.  Psychiatric/Behavioral: Negative for depression. The patient is nervous/anxious.     MEDICATIONS AT HOME:   Prior to Admission medications   Medication Sig Start Date End Date Taking? Authorizing Provider  albuterol (PROVENTIL HFA;VENTOLIN HFA) 108 (90 Base) MCG/ACT inhaler Inhale 2 puffs into the lungs  every 6 (six) hours as needed for wheezing or shortness of breath. 11/26/17   Enid Baas, MD  atorvastatin (LIPITOR) 40 MG tablet Take 40 mg by mouth at bedtime.     [provider]  carvedilol (COREG) 3.125 MG tablet Take 1 tablet (3.125 mg total) by mouth 2 (two) times daily with a meal. 04/18/18   Enedina Finner, MD  furosemide (LASIX) 20 MG tablet Take 1 tablet (20 mg total) by mouth daily. 04/18/18   Enedina Finner, MD  ipratropium-albuterol (DUONEB) 0.5-2.5 (3) MG/3ML SOLN Take 3 mLs by nebulization every 6 (six) hours  as needed (wheezing). 04/18/18   Enedina Finner, MD  Melatonin 1 MG TABS Take 1-5 mg by mouth at bedtime as needed (sleep).     [provider]  methocarbamol (ROBAXIN) 500 MG tablet Take 1 tablet (500 mg total) by mouth every 6 (six) hours as needed for muscle spasms. 05/24/18   Tommi Rumps, PA-C  naproxen (NAPROSYN) 500 MG tablet Take 1 tablet (500 mg total) by mouth 2 (two) times daily with a meal. 05/24/18   Tommi Rumps, PA-C  omeprazole (PRILOSEC) 20 MG capsule Take 20 mg by mouth daily.     [provider]  oxyCODONE (OXY IR/ROXICODONE) 5 MG immediate release tablet Take 1 tablet (5 mg total) by mouth every 6 (six) hours as needed for severe pain. 08/06/18 09/05/18  Barbette Merino, NP  polyethylene glycol (MIRALAX / GLYCOLAX) packet Take 17 g by mouth daily. Patient taking differently: Take 17 g by mouth daily as needed for mild constipation or moderate constipation.  11/27/17   Enid Baas, MD  potassium chloride SA (K-DUR,KLOR-CON) 20 MEQ tablet Take 20 mEq by mouth daily.    [provider]     VITAL SIGNS:  Blood pressure 118/70, pulse 87, temperature 98.3 F (36.8 C), temperature source Oral, resp. rate 18, height 4\' 9"  (1.448 m), weight 57.2 kg, SpO2 99 %.  PHYSICAL EXAMINATION:  Physical Exam  GENERAL:  57 y.o.-year-old patient lying in the bed with conversational dyspnea.  Audible wheezing EYES: Pupils equal, round, reactive to light and accommodation. No scleral icterus. Extraocular muscles intact.  HEENT: Head atraumatic, normocephalic. Oropharynx and nasopharynx clear. No oropharyngeal erythema, moist oral mucosa  NECK:  Supple, no jugular venous distention. No thyroid enlargement, no tenderness.  LUNGS: Decreased breath sounds with bilateral wheezing and crackles CARDIOVASCULAR: S1, S2 normal. No murmurs, rubs, or gallops.  ABDOMEN: Soft, nontender, nondistended. Bowel sounds present. No organomegaly or mass.  EXTREMITIES: No pedal  edema, cyanosis, or clubbing. + 2 pedal & radial pulses b/l.   NEUROLOGIC: Cranial nerves II through XII are intact. No focal Motor or sensory deficits appreciated b/l PSYCHIATRIC: The patient is alert and oriented x 3. Good affect.  SKIN: No obvious rash, lesion, or ulcer.   LABORATORY PANEL:   CBC Recent Labs  Lab 08/14/18 1429  WBC 5.0  HGB 12.9  HCT 38.5  PLT 211   ------------------------------------------------------------------------------------------------------------------  Chemistries  Recent Labs  Lab 08/14/18 1429  NA 140  K 3.8  CL 106  CO2 23  GLUCOSE 124*  BUN 14  CREATININE 0.73  CALCIUM 9.1  AST 22  ALT 19  ALKPHOS 121  BILITOT 0.6   ------------------------------------------------------------------------------------------------------------------  Cardiac Enzymes No results for input(s): TROPONINI in the last 168 hours. ------------------------------------------------------------------------------------------------------------------  RADIOLOGY:  Dg Chest Portable 1 View  Result Date: 08/14/2018 CLINICAL DATA:  Mid chest pain, wheezing, tightness EXAM: PORTABLE CHEST 1 VIEW COMPARISON:  CT chest 06/21/2018 FINDINGS: There is no focal parenchymal opacity. There is no pleural effusion or pneumothorax. The heart and mediastinal contours are unremarkable. The osseous structures are unremarkable. IMPRESSION: No active disease. Electronically Signed   By: Elige Ko   On: 08/14/2018 15:05     IMPRESSION AND PLAN:   *Acute hypoxic respiratory failure secondary to COPD exacerbation and CHF.  Was on BiPAP and now on nasal cannula.  Wean oxygen as tolerated.  *Acute COPD exacerbation -IV steroids, Antibiotics - Scheduled Nebulizers - Inhalers -Wean O2 as tolerated - Consult pulmonary if no improvement  *Acute on chronic diastolic and systolic CHF - IV Lasix, Beta blockers - Input and Output - Counseled to limit fluids and Salt - Monitor Bun/Cr  and Potassium - Echo -reviewed -Cardiology follow up after discharge  *Hypertension.  Continue Coreg.  *DVT prophylaxis with Lovenox  All the records are reviewed and case discussed with ED provider. Management plans discussed with the patient, family and they are in agreement.  CODE STATUS: FULL CODE  TOTAL TIME TAKING CARE OF THIS PATIENT: 40 minutes.   Molinda Bailiff Davit Vassar M.D on 08/14/2018 at 7:59 PM  Between 7am to 6pm - Pager - [phone]  After 6pm go to [email or web address] - password [email or web address]  [facility] [place] [facility]  Office  [phone]  CC: Primary care physician; Martie Round, NP  Note: This dictation was prepared with Dragon dictation along with smaller phrase technology. Any transcriptional errors that result from this process are unintentional.

## 2018-08-15 ENCOUNTER — Ambulatory Visit: Payer: Medicaid Other | Admitting: Nurse Practitioner

## 2018-08-15 MED ORDER — GUAIFENESIN-DM 100-10 MG/5ML PO SYRP
5.0000 mL | ORAL_SOLUTION | ORAL | Status: DC | PRN
  Administered 2018-08-15 (×2): 5 mL via ORAL
  Filled 2018-08-15 (×3): qty 5

## 2018-08-15 MED ORDER — IPRATROPIUM-ALBUTEROL 0.5-2.5 (3) MG/3ML IN SOLN
3.0000 mL | Freq: Four times a day (QID) | RESPIRATORY_TRACT | Status: DC
  Administered 2018-08-15 – 2018-08-18 (×12): 3 mL via RESPIRATORY_TRACT
  Filled 2018-08-15 (×13): qty 3

## 2018-08-15 NOTE — Progress Notes (Signed)
[facility] - [place] at [facility]   PATIENT NAME: Elizabeth [name]    MR#:  [id number]  DATE OF BIRTH:  June 07, 1961  SUBJECTIVE:  CHIEF COMPLAINT:   Chief Complaint  Patient presents with  . Shortness of Breath   Continues to have audible wheezing.  Still has significant shortness of breath.  Off oxygen.  Afebrile.  No chest pain.  REVIEW OF SYSTEMS:    Review of Systems  Constitutional: Positive for malaise/fatigue. Negative for chills and fever.  HENT: Negative for sore throat.   Eyes: Negative for blurred vision, double vision and pain.  Respiratory: Positive for cough, shortness of breath and wheezing. Negative for hemoptysis.   Cardiovascular: Negative for chest pain, palpitations, orthopnea and leg swelling.  Gastrointestinal: Negative for abdominal pain, constipation, diarrhea, heartburn, nausea and vomiting.  Genitourinary: Negative for dysuria and hematuria.  Musculoskeletal: Negative for back pain and joint pain.  Skin: Negative for rash.  Neurological: Negative for sensory change, speech change, focal weakness and headaches.  Endo/Heme/Allergies: Does not bruise/bleed easily.  Psychiatric/Behavioral: Negative for depression. The patient is not nervous/anxious.     DRUG ALLERGIES:   Allergies  Allergen Reactions  . Lisinopril Anaphylaxis    VITALS:  Blood pressure 119/63, pulse 90, temperature 98 F (36.7 C), temperature source Oral, resp. rate 18, height 4\' 9"  (1.448 m), weight 64.5 kg, SpO2 94 %.  PHYSICAL EXAMINATION:   Physical Exam  GENERAL:  57 y.o.-year-old patient lying in the bed with conversational dyspnea EYES: Pupils equal, round, reactive to light and accommodation. No scleral icterus. Extraocular muscles intact.  HEENT: Head atraumatic, normocephalic. Oropharynx and nasopharynx clear.  NECK:  Supple, no jugular venous distention. No thyroid enlargement, no tenderness.  LUNGS: Increased work of breathing.  Bilateral wheezing.   Decreased air entry CARDIOVASCULAR: S1, S2 normal. No murmurs, rubs, or gallops.  ABDOMEN: Soft, nontender, nondistended. Bowel sounds present. No organomegaly or mass.  EXTREMITIES: No cyanosis, clubbing or edema b/l.    NEUROLOGIC: Cranial nerves II through XII are intact. No focal Motor or sensory deficits b/l.   PSYCHIATRIC: The patient is alert and oriented x 3.  SKIN: No obvious rash, lesion, or ulcer.   LABORATORY PANEL:   CBC Recent Labs  Lab 08/14/18 1429  WBC 5.0  HGB 12.9  HCT 38.5  PLT 211   ------------------------------------------------------------------------------------------------------------------ Chemistries  Recent Labs  Lab 08/14/18 1429  NA 140  K 3.8  CL 106  CO2 23  GLUCOSE 124*  BUN 14  CREATININE 0.73  CALCIUM 9.1  AST 22  ALT 19  ALKPHOS 121  BILITOT 0.6   ------------------------------------------------------------------------------------------------------------------  Cardiac Enzymes No results for input(s): TROPONINI in the last 168 hours. ------------------------------------------------------------------------------------------------------------------  RADIOLOGY:  Dg Chest Portable 1 View  Result Date: 08/14/2018 CLINICAL DATA:  Mid chest pain, wheezing, tightness EXAM: PORTABLE CHEST 1 VIEW COMPARISON:  CT chest 06/21/2018 FINDINGS: There is no focal parenchymal opacity. There is no pleural effusion or pneumothorax. The heart and mediastinal contours are unremarkable. The osseous structures are unremarkable. IMPRESSION: No active disease. Electronically Signed   By: Elige Ko   On: 08/14/2018 15:05     ASSESSMENT AND PLAN:   *Acute hypoxic respiratory failure secondary to COPD exacerbation and CHF.  Was on BiPAP and then on nasal cannula.   On RA today  *Acute COPD exacerbation -IV steroids - Scheduled Nebulizers - Inhalers  *Acute on chronic diastolic and systolic CHF - IV Lasix, Beta blockers - Input and Output -  Counseled to limit fluids and Salt - Monitor Bun/Cr and Potassium - Echo -reviewed -Cardiology follow up after discharge  *Hypertension.  Continue Coreg.  *DVT prophylaxis with Lovenox  All the records are reviewed and case discussed with Care Management/Social Worker Management plans discussed with the patient, family and they are in agreement.  CODE STATUS: FULL CODE  DVT Prophylaxis: SCDs  TOTAL TIME TAKING CARE OF THIS PATIENT: 35 minutes.   POSSIBLE D/C IN 1-2 DAYS, DEPENDING ON CLINICAL CONDITION.  Molinda Bailiff Marvena Tally M.D on 08/15/2018 at 1:33 PM  Between 7am to 6pm - Pager - [phone]  After 6pm go to [email or web address] - password [email or web address]  [facility] [place] [facility]  Office  [phone]  CC: Primary care physician; Martie Round, NP  Note: This dictation was prepared with Dragon dictation along with smaller phrase technology. Any transcriptional errors that result from this process are unintentional.

## 2018-08-16 LAB — BASIC METABOLIC PANEL
ANION GAP: 12 (ref 5–15)
BUN: 31 mg/dL — AB (ref 6–20)
CALCIUM: 8.8 mg/dL — AB (ref 8.9–10.3)
CO2: 28 mmol/L (ref 22–32)
Chloride: 103 mmol/L (ref 98–111)
Creatinine, Ser: 0.9 mg/dL (ref 0.44–1.00)
GFR calc Af Amer: >60 mL/min (ref >60)
GLUCOSE: 108 mg/dL — AB (ref 70–99)
POTASSIUM: 3.7 mmol/L (ref 3.5–5.1)
Sodium: 143 mmol/L (ref 135–145)

## 2018-08-16 MED ORDER — AZITHROMYCIN 500 MG PO TABS
500.0000 mg | ORAL_TABLET | Freq: Every day | ORAL | Status: AC
  Administered 2018-08-16 – 2018-08-18 (×3): 500 mg via ORAL
  Filled 2018-08-16 (×3): qty 1

## 2018-08-16 MED ORDER — MOMETASONE FURO-FORMOTEROL FUM 100-5 MCG/ACT IN AERO
2.0000 | INHALATION_SPRAY | Freq: Two times a day (BID) | RESPIRATORY_TRACT | Status: DC
  Administered 2018-08-16 – 2018-08-18 (×5): 2 via RESPIRATORY_TRACT
  Filled 2018-08-16: qty 8.8

## 2018-08-16 MED ORDER — HYDROCOD POLST-CPM POLST ER 10-8 MG/5ML PO SUER
5.0000 mL | Freq: Two times a day (BID) | ORAL | Status: DC | PRN
  Administered 2018-08-16 – 2018-08-17 (×3): 5 mL via ORAL
  Filled 2018-08-16 (×2): qty 5

## 2018-08-16 MED ORDER — TRAZODONE HCL 50 MG PO TABS: 25.0000 mg | ORAL_TABLET | Freq: Every evening | ORAL | Status: DC | PRN

## 2018-08-16 MED ORDER — TRAZODONE HCL 50 MG PO TABS
50.0000 mg | ORAL_TABLET | Freq: Every evening | ORAL | Status: DC | PRN
  Administered 2018-08-16: 23:00:00 50 mg via ORAL
  Filled 2018-08-16: qty 1

## 2018-08-16 NOTE — Progress Notes (Signed)
[facility] - [place] at [facility]   PATIENT NAME: Elizabeth [name]    MR#:  [id number]  DATE OF BIRTH:  [birth year]  SUBJECTIVE:  CHIEF COMPLAINT:   Chief Complaint  Patient presents with  . Shortness of Breath   Off oxygen but still has significant shortness of breath and wheezing. Dry cough.  Afebrile. Feels weak.  REVIEW OF SYSTEMS:    Review of Systems  Constitutional: Positive for malaise/fatigue. Negative for chills and fever.  HENT: Negative for sore throat.   Eyes: Negative for blurred vision, double vision and pain.  Respiratory: Positive for cough, shortness of breath and wheezing. Negative for hemoptysis.   Cardiovascular: Negative for chest pain, palpitations, orthopnea and leg swelling.  Gastrointestinal: Negative for abdominal pain, constipation, diarrhea, heartburn, nausea and vomiting.  Genitourinary: Negative for dysuria and hematuria.  Musculoskeletal: Negative for back pain and joint pain.  Skin: Negative for rash.  Neurological: Negative for sensory change, speech change, focal weakness and headaches.  Endo/Heme/Allergies: Does not bruise/bleed easily.  Psychiatric/Behavioral: Negative for depression. The patient is not nervous/anxious.     DRUG ALLERGIES:   Allergies  Allergen Reactions  . Lisinopril Anaphylaxis    VITALS:  Blood pressure 111/66, pulse 66, temperature 98.7 F (37.1 C), temperature source Oral, resp. rate 18, height 4\' 9"  (1.448 m), weight 63.6 kg, SpO2 90 %.  PHYSICAL EXAMINATION:   Physical Exam  GENERAL:  57 y.o.-year-old patient lying in the bed with conversational dyspnea EYES: Pupils equal, round, reactive to light and accommodation. No scleral icterus. Extraocular muscles intact.  HEENT: Head atraumatic, normocephalic. Oropharynx and nasopharynx clear.  NECK:  Supple, no jugular venous distention. No thyroid enlargement, no tenderness.  LUNGS: Increased work of breathing.  Bilateral wheezing.  Decreased air  entry CARDIOVASCULAR: S1, S2 normal. No murmurs, rubs, or gallops.  ABDOMEN: Soft, nontender, nondistended. Bowel sounds present. No organomegaly or mass.  EXTREMITIES: No cyanosis, clubbing or edema b/l.    NEUROLOGIC: Cranial nerves II through XII are intact. No focal Motor or sensory deficits b/l.   PSYCHIATRIC: The patient is alert and oriented x 3.  SKIN: No obvious rash, lesion, or ulcer.   LABORATORY PANEL:   CBC Recent Labs  Lab 08/14/18 1429  WBC 5.0  HGB 12.9  HCT 38.5  PLT 211   ------------------------------------------------------------------------------------------------------------------ Chemistries  Recent Labs  Lab 08/14/18 1429 08/16/18 0555  NA 140 143  K 3.8 3.7  CL 106 103  CO2 23 28  GLUCOSE 124* 108*  BUN 14 31*  CREATININE 0.73 0.90  CALCIUM 9.1 8.8*  AST 22  --   ALT 19  --   ALKPHOS 121  --   BILITOT 0.6  --    ------------------------------------------------------------------------------------------------------------------  Cardiac Enzymes No results for input(s): TROPONINI in the last 168 hours. ------------------------------------------------------------------------------------------------------------------  RADIOLOGY:  Dg Chest Portable 1 View  Result Date: 08/14/2018 CLINICAL DATA:  Mid chest pain, wheezing, tightness EXAM: PORTABLE CHEST 1 VIEW COMPARISON:  CT chest 06/21/2018 FINDINGS: There is no focal parenchymal opacity. There is no pleural effusion or pneumothorax. The heart and mediastinal contours are unremarkable. The osseous structures are unremarkable. IMPRESSION: No active disease. Electronically Signed   By: Elige Ko   On: 08/14/2018 15:05     ASSESSMENT AND PLAN:   *Acute hypoxic respiratory failure secondary to COPD exacerbation and CHF Resolved.  Was on BiPAP and now on RA   *Acute COPD exacerbation -IV steroids - Scheduled Nebulizers - Inhalers Continues to have significant wheezing  and shortness of  breath.  Will add azithromycin.  Flutter valve.  Cough medication ordered.  *Acute on chronic diastolic and systolic CHF Improved.  Stop IV Lasix.  Add oral Lasix from tomorrow.  *Hypertension.  Continue Coreg.  *DVT prophylaxis with Lovenox  All the records are reviewed and case discussed with Care Management/Social Worker Management plans discussed with the patient, family and they are in agreement.  CODE STATUS: FULL CODE  DVT Prophylaxis: SCDs  TOTAL TIME TAKING CARE OF THIS PATIENT: 35 minutes.   POSSIBLE D/C IN 1-2 DAYS, DEPENDING ON CLINICAL CONDITION.  Molinda Bailiff Quintin Hjort M.D on 08/16/2018 at 12:00 PM  Between 7am to 6pm - Pager - [phone]  After 6pm go to [email or web address] - password [email or web address]  [facility] [place] [facility]  Office  [phone]  CC: Primary care physician; Martie Round, NP  Note: This dictation was prepared with Dragon dictation along with smaller phrase technology. Any transcriptional errors that result from this process are unintentional.

## 2018-08-16 NOTE — Progress Notes (Signed)
Advance care planning  Purpose of Encounter COPD exacerbation and CODE STATUS discussion  Parties in Attendance Patient  Patients Decisional capacity Alert and oriented.  Able to make medical decisions.  Patient does not have documented healthcare power of attorney.  Wants her sons Scotty. Manis and [place] Loewenstein to make decisions if she is unable to.  Encouraged to finish documentation.  Discussed with patient regarding CODE STATUS.  After asking appropriate questions patient has decided to be a partial code with okay for CPR and defibrillation but no intubation.  Time spent - 20 minutes

## 2018-08-17 MED ORDER — ZOLPIDEM TARTRATE 5 MG PO TABS
5.0000 mg | ORAL_TABLET | Freq: Every evening | ORAL | Status: DC | PRN
  Administered 2018-08-17: 5 mg via ORAL
  Filled 2018-08-17: qty 1

## 2018-08-17 MED ORDER — FUROSEMIDE 20 MG PO TABS
20.0000 mg | ORAL_TABLET | Freq: Every day | ORAL | Status: DC
  Administered 2018-08-17 – 2018-08-18 (×2): 20 mg via ORAL
  Filled 2018-08-17 (×2): qty 1

## 2018-08-17 NOTE — Progress Notes (Signed)
[facility] - [place] at [facility]   PATIENT NAME: Elizabeth [name]    MR#:  [id number]  DATE OF BIRTH:  [birth year]  SUBJECTIVE:  CHIEF COMPLAINT:   Chief Complaint  Patient presents with  . Shortness of Breath   -Improving wheezing. -Complains of significant insomnia  REVIEW OF SYSTEMS:  Review of Systems  Constitutional: Negative for chills, fever and malaise/fatigue.  HENT: Negative for congestion, ear discharge, hearing loss and nosebleeds.   Eyes: Negative for blurred vision and double vision.  Respiratory: Positive for cough, shortness of breath and wheezing.   Cardiovascular: Negative for chest pain and palpitations.  Gastrointestinal: Negative for abdominal pain, constipation, diarrhea, nausea and vomiting.  Genitourinary: Negative for dysuria.  Neurological: Negative for dizziness, focal weakness, seizures, weakness and headaches.  Psychiatric/Behavioral: Negative for depression. The patient has insomnia.     DRUG ALLERGIES:   Allergies  Allergen Reactions  . Lisinopril Anaphylaxis    VITALS:  Blood pressure 107/64, pulse 72, temperature 98.1 F (36.7 C), temperature source Oral, resp. rate 20, height 4\' 9"  (1.448 m), weight 64.2 kg, SpO2 93 %.  PHYSICAL EXAMINATION:  Physical Exam  GENERAL:  57 y.o.-year-old patient lying in the bed with no acute distress.  EYES: Pupils equal, round, reactive to light and accommodation. No scleral icterus. Extraocular muscles intact.  HEENT: Head atraumatic, normocephalic. Oropharynx and nasopharynx clear.  NECK:  Supple, no jugular venous distention. No thyroid enlargement, no tenderness.  LUNGS: More upper airway wheezing.  Clears up when speaking.  No rales,rhonchi or crepitation. No use of accessory muscles of respiration.  CARDIOVASCULAR: S1, S2 normal. No murmurs, rubs, or gallops.  ABDOMEN: Soft, nontender, nondistended. Bowel sounds present. No organomegaly or mass.  EXTREMITIES: No pedal edema,  cyanosis, or clubbing.  NEUROLOGIC: Cranial nerves II through XII are intact. Muscle strength 5/5 in all extremities. Sensation intact. Gait not checked.  PSYCHIATRIC: The patient is alert and oriented x 3.  SKIN: No obvious rash, lesion, or ulcer.    LABORATORY PANEL:   CBC Recent Labs  Lab 08/14/18 1429  WBC 5.0  HGB 12.9  HCT 38.5  PLT 211   ------------------------------------------------------------------------------------------------------------------  Chemistries  Recent Labs  Lab 08/14/18 1429 08/16/18 0555  NA 140 143  K 3.8 3.7  CL 106 103  CO2 23 28  GLUCOSE 124* 108*  BUN 14 31*  CREATININE 0.73 0.90  CALCIUM 9.1 8.8*  AST 22  --   ALT 19  --   ALKPHOS 121  --   BILITOT 0.6  --    ------------------------------------------------------------------------------------------------------------------  Cardiac Enzymes No results for input(s): TROPONINI in the last 168 hours. ------------------------------------------------------------------------------------------------------------------  RADIOLOGY:  No results found.  EKG:   Orders placed or performed during the hospital encounter of 08/14/18  . EKG 12-Lead  . EKG 12-Lead  . ED EKG  . ED EKG    ASSESSMENT AND PLAN:   57 year old female with past medical history significant for COPD, CHF not on home oxygen, hypertension, GERD presents to hospital secondary to worsening shortness of breath  1.  Acute on chronic COPD exacerbation-initially required 2 L supplemental oxygen, currently on room air -Wheezing is slowly improving.  Still remains on IV steroids, nebs and inhalers -Cough medicines, azithromycin.  2.  Hypertension-on Coreg  3.  Systolic CHF-well compensated.  Restart home dose of Lasix.  4.  GERD-PPI  5.  DVT prophylaxis-Lovenox  6.  Insomnia-give Ambien tonight and see.  Patient is independent and ambulatory  All the records are reviewed and case discussed with Care  Management/Social Workerr. Management plans discussed with the patient, family and they are in agreement.  CODE STATUS: Full code  TOTAL TIME TAKING CARE OF THIS PATIENT: 38 minutes.   POSSIBLE D/C IN 2 DAYS, DEPENDING ON CLINICAL CONDITION.   Enid Baas M.D on 08/17/2018 at 2:35 PM  Between 7am to 6pm - Pager - [phone]  After 6pm go to [email or web address] - Social research officer, government  [facility] [place] [facility]  Office  [phone]  CC: Primary care physician; Martie Round, NP

## 2018-08-18 LAB — BASIC METABOLIC PANEL
ANION GAP: 8 (ref 5–15)
BUN: 25 mg/dL — ABNORMAL HIGH (ref 6–20)
CALCIUM: 8.7 mg/dL — AB (ref 8.9–10.3)
CO2: 29 mmol/L (ref 22–32)
Chloride: 103 mmol/L (ref 98–111)
Creatinine, Ser: 0.76 mg/dL (ref 0.44–1.00)
GLUCOSE: 125 mg/dL — AB (ref 70–99)
POTASSIUM: 4.1 mmol/L (ref 3.5–5.1)
SODIUM: 140 mmol/L (ref 135–145)

## 2018-08-18 MED ORDER — ATORVASTATIN CALCIUM 40 MG PO TABS: 40.0000 mg | ORAL_TABLET | Freq: Every day | ORAL | 2 refills | Status: AC

## 2018-08-18 MED ORDER — IPRATROPIUM-ALBUTEROL 0.5-2.5 (3) MG/3ML IN SOLN: 3.0000 mL | Freq: Four times a day (QID) | RESPIRATORY_TRACT | 1 refills | Status: DC | PRN

## 2018-08-18 MED ORDER — FUROSEMIDE 20 MG PO TABS: 20.0000 mg | ORAL_TABLET | Freq: Every day | ORAL | 2 refills | Status: DC

## 2018-08-18 MED ORDER — HYDROCOD POLST-CPM POLST ER 10-8 MG/5ML PO SUER: 5.0000 mL | Freq: Two times a day (BID) | ORAL | 0 refills | Status: DC | PRN

## 2018-08-18 MED ORDER — PREDNISONE 20 MG PO TABS: 40.0000 mg | ORAL_TABLET | Freq: Every day | ORAL | 0 refills | Status: AC

## 2018-08-18 MED ORDER — CARVEDILOL 3.125 MG PO TABS: 3.1250 mg | ORAL_TABLET | Freq: Two times a day (BID) | ORAL | 1 refills | Status: AC

## 2018-08-18 MED ORDER — MOMETASONE FURO-FORMOTEROL FUM 100-5 MCG/ACT IN AERO: 2.0000 | INHALATION_SPRAY | Freq: Two times a day (BID) | RESPIRATORY_TRACT | 2 refills | Status: AC

## 2018-08-18 NOTE — Progress Notes (Signed)
Discharge instructions given and went over with patient at bedside. Prescriptions given and reviewed. All questions answered. Patient discharged home. Djuana Littleton S, RN  

## 2018-08-18 NOTE — Discharge Summary (Signed)
[facility] -  at [facility]   PATIENT NAME: Elizabeth [name]    MR#:  [id number]  DATE OF BIRTH:  01-18-1961  DATE OF ADMISSION:  08/14/2018   ADMITTING PHYSICIAN: Milagros Loll, MD  DATE OF DISCHARGE: 08/18/2018  1:27 PM  PRIMARY CARE PHYSICIAN: Martie Round, NP   ADMISSION DIAGNOSIS:   SOB (shortness of breath) [R06.02] COPD exacerbation (HCC) [J44.1]  DISCHARGE DIAGNOSIS:   Active Problems:   COPD exacerbation (HCC)   SECONDARY DIAGNOSIS:   Past Medical History:  Diagnosis Date  . Anxiety   . Arm pain 07/25/2015  . CHF (congestive heart failure) (HCC)   . Congestive heart failure (HCC) 07/25/2015  . COPD (chronic obstructive pulmonary disease) (HCC)    never been idagnosed  . Depression   . Lower extremity pain 07/25/2015  . Reflux   . RSD (reflex sympathetic dystrophy)   . Spinal cord stimulator status   . Vitamin B12 deficiency   . Vitamin D deficiency     HOSPITAL COURSE:   57 year old female with past medical history significant for COPD, CHF not on home oxygen, hypertension, GERD presents to hospital secondary to worsening shortness of breath  1.  Acute on chronic COPD exacerbation-initially required 2 L supplemental oxygen, currently on room air -Wheezing is significantly improved.  Still has upper airway wheeze which clears up and speaking.  But has been up and ambulating without any discomfort, dyspnea or hypoxia. -Was on IV steroids.  Discharged on oral steroids, inhalers. -Cough medicines, finished azithromycin.  2.  Hypertension-on Coreg  3.  Systolic CHF-well compensated.   -Continue home medications of Coreg, statin, Lasix  4.  GERD-PPI  6.  Insomnia-tried Ambien and trazodone in the hospital with no improvement.  Advised outpatient follow-up  Patient is independent and ambulatory No dyspnea or hypoxia on ambulation.  DISCHARGE CONDITIONS:   Guarded  CONSULTS OBTAINED:   None  DRUG ALLERGIES:    Allergies  Allergen Reactions  . Lisinopril Anaphylaxis   DISCHARGE MEDICATIONS:   Allergies as of 08/18/2018      Reactions   Lisinopril Anaphylaxis      Medication List    STOP taking these medications   naproxen 500 MG tablet Commonly known as:  NAPROSYN     TAKE these medications   albuterol 108 (90 Base) MCG/ACT inhaler Commonly known as:  PROVENTIL HFA;VENTOLIN HFA Inhale 2 puffs into the lungs every 6 (six) hours as needed for wheezing or shortness of breath.   atorvastatin 40 MG tablet Commonly known as:  LIPITOR Take 1 tablet (40 mg total) by mouth at bedtime.   carvedilol 3.125 MG tablet Commonly known as:  COREG Take 1 tablet (3.125 mg total) by mouth 2 (two) times daily with a meal.   chlorpheniramine-HYDROcodone 10-8 MG/5ML Suer Commonly known as:  TUSSIONEX Take 5 mLs by mouth every 12 (twelve) hours as needed for cough.   furosemide 20 MG tablet Commonly known as:  LASIX Take 1 tablet (20 mg total) by mouth daily.   ipratropium-albuterol 0.5-2.5 (3) MG/3ML Soln Commonly known as:  DUONEB Take 3 mLs by nebulization every 6 (six) hours as needed (wheezing).   Melatonin 1 MG Tabs Take 1-5 mg by mouth at bedtime as needed (sleep).   methocarbamol 500 MG tablet Commonly known as:  ROBAXIN Take 1 tablet (500 mg total) by mouth every 6 (six) hours as needed for muscle spasms.   mometasone-formoterol 100-5 MCG/ACT Aero Commonly known as:  DULERA Inhale 2 puffs  into the lungs 2 (two) times daily.   omeprazole 20 MG capsule Commonly known as:  PRILOSEC Take 20 mg by mouth daily.   oxyCODONE 5 MG immediate release tablet Commonly known as:  Oxy IR/ROXICODONE Take 1 tablet (5 mg total) by mouth every 6 (six) hours as needed for severe pain.   polyethylene glycol packet Commonly known as:  MIRALAX / GLYCOLAX Take 17 g by mouth daily. What changed:    when to take this  reasons to take this   potassium chloride SA 20 MEQ tablet Commonly  known as:  K-DUR,KLOR-CON Take 20 mEq by mouth daily.   predniSONE 20 MG tablet Commonly known as:  DELTASONE Take 2 tablets (40 mg total) by mouth daily for 5 days.        DISCHARGE INSTRUCTIONS:   1.  PCP follow-up in 1 to 2 weeks  DIET:   Cardiac diet  ACTIVITY:   Activity as tolerated  OXYGEN:   Home Oxygen: No.  Oxygen Delivery: room air  DISCHARGE LOCATION:   home   If you experience worsening of your admission symptoms, develop shortness of breath, life threatening emergency, suicidal or homicidal thoughts you must seek medical attention immediately by calling 911 or calling your MD immediately  if symptoms less severe.  You Must read complete instructions/literature along with all the possible adverse reactions/side effects for all the Medicines you take and that have been prescribed to you. Take any new Medicines after you have completely understood and accpet all the possible adverse reactions/side effects.   Please note  You were cared for by a hospitalist during your hospital stay. If you have any questions about your discharge medications or the care you received while you were in the hospital after you are discharged, you can call the unit and asked to speak with the hospitalist on call if the hospitalist that took care of you is not available. Once you are discharged, your primary care physician will handle any further medical issues. Please note that NO REFILLS for any discharge medications will be authorized once you are discharged, as it is imperative that you return to your primary care physician (or establish a relationship with a primary care physician if you do not have one) for your aftercare needs so that they can reassess your need for medications and monitor your lab values.    On the day of Discharge:  VITAL SIGNS:   Blood pressure 112/64, pulse 60, temperature 98 F (36.7 C), temperature source Oral, resp. rate 18, height 4\' 9"  (1.448 m),  weight 64.2 kg, SpO2 93 %.  PHYSICAL EXAMINATION:    GENERAL:  57 y.o.-year-old patient lying in the bed with no acute distress.  EYES: Pupils equal, round, reactive to light and accommodation. No scleral icterus. Extraocular muscles intact.  HEENT: Head atraumatic, normocephalic. Oropharynx and nasopharynx clear.  NECK:  Supple, no jugular venous distention. No thyroid enlargement, no tenderness.  LUNGS: More upper airway wheezing.  Clears up when speaking.  Only minimal scattered wheezing posteriorly at the bases.  Otherwise lungs are clear. No rales,rhonchi or crepitation. No use of accessory muscles of respiration.  CARDIOVASCULAR: S1, S2 normal. No murmurs, rubs, or gallops.  ABDOMEN: Soft, nontender, nondistended. Bowel sounds present. No organomegaly or mass.  EXTREMITIES: No pedal edema, cyanosis, or clubbing.  NEUROLOGIC: Cranial nerves II through XII are intact. Muscle strength 5/5 in all extremities. Sensation intact. Gait not checked.  PSYCHIATRIC: The patient is alert and oriented x 3.  SKIN: No obvious rash, lesion, or ulcer.   DATA REVIEW:   CBC Recent Labs  Lab 08/14/18 1429  WBC 5.0  HGB 12.9  HCT 38.5  PLT 211    Chemistries  Recent Labs  Lab 08/14/18 1429  08/18/18 0355  NA 140   < > 140  K 3.8   < > 4.1  CL 106   < > 103  CO2 23   < > 29  GLUCOSE 124*   < > 125*  BUN 14   < > 25*  CREATININE 0.73   < > 0.76  CALCIUM 9.1   < > 8.7*  AST 22  --   --   ALT 19  --   --   ALKPHOS 121  --   --   BILITOT 0.6  --   --    < > = values in this interval not displayed.     Microbiology Results  Results for orders placed or performed during the hospital encounter of 04/11/18  MRSA PCR Screening     Status: None   Collection Time: 04/11/18  5:30 PM  Result Value Ref Range Status   MRSA by PCR NEGATIVE NEGATIVE Final    Comment:        The GeneXpert MRSA Assay (FDA approved for NASAL specimens only), is one component of a comprehensive MRSA  colonization surveillance program. It is not intended to diagnose MRSA infection nor to guide or monitor treatment for MRSA infections. Performed at [facility], [address] [place], [place] [address]     RADIOLOGY:  No results found.   Management plans discussed with the patient, family and they are in agreement.  CODE STATUS:     Code Status Orders  (From admission, onward)         Start     Ordered   08/16/18 1258  Full code  Continuous     08/16/18 1257        Code Status History    Date Active Date Inactive Code Status Order ID Comments User Context   08/16/2018 1202 08/16/2018 1257 Partial Code [id number]  Milagros Loll, MD Inpatient   08/14/2018 1957 08/16/2018 1202 Full Code [id number]  Milagros Loll, MD ED   04/11/2018 1726 04/18/2018 2042 Full Code [id number]  Ihor Austin, MD Inpatient   11/23/2017 2050 11/26/2017 2224 Full Code [id number]  Auburn Bilberry, MD ED      TOTAL TIME TAKING CARE OF THIS PATIENT: 38 minutes.    Enid Baas M.D on 08/18/2018 at 1:57 PM  Between 7am to 6pm - Pager - [phone]  After 6pm go to [email or web address] - Social research officer, government  [facility]  Office  [phone]  CC: Primary care physician; Martie Round, NP   Note: This dictation was prepared with Dragon dictation along with smaller phrase technology. Any transcriptional errors that result from this process are unintentional.

## 2018-08-22 ENCOUNTER — Encounter: Payer: Self-pay | Admitting: Nurse Practitioner

## 2018-08-22 ENCOUNTER — Other Ambulatory Visit: Payer: Self-pay

## 2018-08-22 ENCOUNTER — Ambulatory Visit: Payer: Medicaid Other | Attending: Nurse Practitioner | Admitting: Nurse Practitioner

## 2018-08-22 VITALS — BP 105/73 | HR 66 | Temp 98.4°F | Resp 18 | Ht <= 58 in | Wt 130.0 lb

## 2018-08-22 DIAGNOSIS — Z79899 Other long term (current) drug therapy: Secondary | ICD-10-CM | POA: Insufficient documentation

## 2018-08-22 DIAGNOSIS — G894 Chronic pain syndrome: Secondary | ICD-10-CM | POA: Diagnosis not present

## 2018-08-22 DIAGNOSIS — Z79891 Long term (current) use of opiate analgesic: Secondary | ICD-10-CM | POA: Diagnosis not present

## 2018-08-22 DIAGNOSIS — Z888 Allergy status to other drugs, medicaments and biological substances status: Secondary | ICD-10-CM | POA: Diagnosis not present

## 2018-08-22 DIAGNOSIS — Z5181 Encounter for therapeutic drug level monitoring: Secondary | ICD-10-CM | POA: Diagnosis present

## 2018-08-22 DIAGNOSIS — E538 Deficiency of other specified B group vitamins: Secondary | ICD-10-CM | POA: Insufficient documentation

## 2018-08-22 DIAGNOSIS — Z87891 Personal history of nicotine dependence: Secondary | ICD-10-CM | POA: Insufficient documentation

## 2018-08-22 DIAGNOSIS — F411 Generalized anxiety disorder: Secondary | ICD-10-CM | POA: Insufficient documentation

## 2018-08-22 DIAGNOSIS — I509 Heart failure, unspecified: Secondary | ICD-10-CM | POA: Diagnosis not present

## 2018-08-22 DIAGNOSIS — G90523 Complex regional pain syndrome I of lower limb, bilateral: Secondary | ICD-10-CM | POA: Diagnosis not present

## 2018-08-22 DIAGNOSIS — M47816 Spondylosis without myelopathy or radiculopathy, lumbar region: Secondary | ICD-10-CM | POA: Diagnosis not present

## 2018-08-22 DIAGNOSIS — K219 Gastro-esophageal reflux disease without esophagitis: Secondary | ICD-10-CM | POA: Diagnosis not present

## 2018-08-22 DIAGNOSIS — G90511 Complex regional pain syndrome I of right upper limb: Secondary | ICD-10-CM

## 2018-08-22 DIAGNOSIS — J441 Chronic obstructive pulmonary disease with (acute) exacerbation: Secondary | ICD-10-CM | POA: Insufficient documentation

## 2018-08-22 DIAGNOSIS — F329 Major depressive disorder, single episode, unspecified: Secondary | ICD-10-CM | POA: Insufficient documentation

## 2018-08-22 DIAGNOSIS — E559 Vitamin D deficiency, unspecified: Secondary | ICD-10-CM | POA: Insufficient documentation

## 2018-08-22 DIAGNOSIS — Z760 Encounter for issue of repeat prescription: Secondary | ICD-10-CM | POA: Diagnosis not present

## 2018-08-22 MED ORDER — OXYCODONE HCL 5 MG PO TABS: 5.0000 mg | ORAL_TABLET | Freq: Four times a day (QID) | ORAL | 0 refills | Status: DC | PRN

## 2018-08-22 NOTE — Patient Instructions (Addendum)
____________________________________________________________________________________________  Medication Rules  Applies to: All patients receiving prescriptions (written or electronic).  Pharmacy of record: Pharmacy where electronic prescriptions will be sent. If written prescriptions are taken to a different pharmacy, please inform the nursing staff. The pharmacy listed in the electronic medical record should be the one where you would like electronic prescriptions to be sent.  Prescription refills: Only during scheduled appointments. Applies to both, written and electronic prescriptions.  NOTE: The following applies primarily to controlled substances (Opioid* Pain Medications).   Patient's responsibilities: 1. Pain Pills: Bring all pain pills to every appointment (except for procedure appointments). 2. Pill Bottles: Bring pills in original pharmacy bottle. Always bring newest bottle. Bring bottle, even if empty. 3. Medication refills: You are responsible for knowing and keeping track of what medications you need refilled. The day before your appointment, write a list of all prescriptions that need to be refilled. Bring that list to your appointment and give it to the admitting nurse. Prescriptions will be written only during appointments. If you forget a medication, it will not be "Called in", "Faxed", or "electronically sent". You will need to get another appointment to get these prescribed. 4. Prescription Accuracy: You are responsible for carefully inspecting your prescriptions before leaving our office. Have the discharge nurse carefully go over each prescription with you, before taking them home. Make sure that your name is accurately spelled, that your address is correct. Check the name and dose of your medication to make sure it is accurate. Check the number of pills, and the written instructions to make sure they are clear and accurate. Make sure that you are given enough medication to last  until your next medication refill appointment. 5. Taking Medication: Take medication as prescribed. Never take more pills than instructed. Never take medication more frequently than prescribed. Taking less pills or less frequently is permitted and encouraged, when it comes to controlled substances (written prescriptions).  6. Inform other Doctors: Always inform, all of your healthcare providers, of all the medications you take. 7. Pain Medication from other Providers: You are not allowed to accept any additional pain medication from any other Doctor or Healthcare provider. There are two exceptions to this rule. (see below) In the event that you require additional pain medication, you are responsible for notifying us, as stated below. 8. Medication Agreement: You are responsible for carefully reading and following our Medication Agreement. This must be signed before receiving any prescriptions from our practice. Safely store a copy of your signed Agreement. Violations to the Agreement will result in no further prescriptions. (Additional copies of our Medication Agreement are available upon request.) 9. Laws, Rules, & Regulations: All patients are expected to follow all Federal and State Laws, Statutes, Rules, & Regulations. Ignorance of the Laws does not constitute a valid excuse. The use of any illegal substances is prohibited. 10. Adopted CDC guidelines & recommendations: Target dosing levels will be at or below 60 MME/day. Use of benzodiazepines** is not recommended.  Exceptions: There are only two exceptions to the rule of not receiving pain medications from other Healthcare Providers. 1. Exception #1 (Emergencies): In the event of an emergency (i.e.: accident requiring emergency care), you are allowed to receive additional pain medication. However, you are responsible for: As soon as you are able, call our office [phone], at any time of the day or night, and leave a message stating your name, the  date and nature of the emergency, and the name and dose of the medication   prescribed. In the event that your call is answered by a member of our staff, make sure to document and save the date, time, and the name of the person that took your information.  2. Exception #2 (Planned Surgery): In the event that you are scheduled by another doctor or dentist to have any type of surgery or procedure, you are allowed (for a period no longer than 30 days), to receive additional pain medication, for the acute post-op pain. However, in this case, you are responsible for picking up a copy of our "Post-op Pain Management for Surgeons" handout, and giving it to your surgeon or dentist. This document is available at our office, and does not require an appointment to obtain it. Simply go to our office during business hours (Monday-Thursday from 8:00 AM to 4:00 PM) (Friday 8:00 AM to 12:00 Noon) or if you have a scheduled appointment with [place], prior to your surgery, and ask for it by name. In addition, you will need to provide [place] with your name, name of your surgeon, type of surgery, and date of procedure or surgery.  *Opioid medications include: morphine, codeine, oxycodone, oxymorphone, hydrocodone, hydromorphone, meperidine, tramadol, tapentadol, buprenorphine, fentanyl, methadone. **Benzodiazepine medications include: diazepam (Valium), alprazolam (Xanax), clonazepam (Klonopine), lorazepam (Ativan), clorazepate (Tranxene), chlordiazepoxide (Librium), estazolam (Prosom), oxazepam (Serax), temazepam (Restoril), triazolam (Halcion) (Last updated: 12/09/2017) ____________________________________________________________________________________________   BMI Assessment: Estimated body mass index is 28.13 kg/m as calculated from the following:   Height as of this encounter: 4\' 9"  (1.448 m).   Weight as of this encounter: 130 lb (59 kg).  BMI interpretation table: BMI level Category Range association with higher incidence  of chronic pain  <18 kg/m2 Underweight   18.5-24.9 kg/m2 Ideal body weight   25-29.9 kg/m2 Overweight Increased incidence by 20%  30-34.9 kg/m2 Obese (Class I) Increased incidence by 68%  35-39.9 kg/m2 Severe obesity (Class II) Increased incidence by 136%  >40 kg/m2 Extreme obesity (Class III) Increased incidence by 254%   Patient's current BMI Ideal Body weight  Body mass index is 28.13 kg/m. Patient must be at least 60 in tall to calculate ideal body weight   BMI Readings from Last 4 Encounters:  08/22/18 28.13 kg/m  08/17/18 30.63 kg/m  06/21/18 28.13 kg/m  05/24/18 25.97 kg/m   Wt Readings from Last 4 Encounters:  08/22/18 130 lb (59 kg)  08/17/18 141 lb 8.6 oz (64.2 kg)  06/21/18 130 lb (59 kg)  05/24/18 120 lb (54.4 kg)

## 2018-08-22 NOTE — Progress Notes (Signed)
Nursing Pain Medication Assessment:  Safety precautions to be maintained throughout the outpatient stay will include: orient to surroundings, keep bed in low position, maintain call bell within reach at all times, provide assistance with transfer out of bed and ambulation.   Medication Inspection Compliance: Pill count conducted under aseptic conditions, in front of the patient. Neither the pills nor the bottle was removed from the patient's sight at any time. Once count was completed pills were immediately returned to the patient in their original bottle.  Medication: Oxycodone HCL Pill/Patch Count: 55 of 120 pills remain Pill/Patch Appearance: Markings consistent with prescribed medication Bottle Appearance: Standard pharmacy container. Clearly labeled. Filled Date: 10 / 26 / 2019 Last Medication intake:  Today

## 2018-08-22 NOTE — Progress Notes (Signed)
Patient's Name: Elizabeth [name]  MRN: [id number]  Referring Provider: [facility]  DOB: 1961/04/11  PCP: [name], NP  DOS: 08/22/2018  Note by: Vevelyn Francois NP  Service setting: Ambulatory outpatient  Specialty: Interventional Pain Management  Location: [facility] (AMB) Pain Management Facility    Patient type: Established    Primary Reason(s) for Visit: Encounter for prescription drug management. (Level of risk: moderate)  CC: Medication Refill  HPI  Elizabeth [name] is a 57 y.o. year old, female patient, who comes today for a medication management evaluation. She has Chronic low back pain; Generalized anxiety disorder; Encounter for therapeutic drug level monitoring; Long term current use of opiate analgesic; Uncomplicated opioid dependence ([place]); Opiate use (30 MME/Day); Chronic pain syndrome; Complex regional pain syndrome I of upper limb (Bilateral); Complex regional pain syndrome I of lower limb (Bilateral); Opiate analgesic use agreement exists; Presence of spinal cord stimulator; COPD exacerbation ([place]); Congestive heart failure ([place]); Vitamin D deficiency; Vitamin B12 deficiency; Chronic upper extremity pain (Bilateral); Chronic lower extremity pain (Bilateral); Long term prescription opiate use; Goals of care, counseling/discussion; Elevated sedimentation rate; Insomnia secondary to chronic pain; Lumbar facet syndrome (Bilateral); Opioid-induced constipation (OIC); Disorder of bone, unspecified; COPD with acute bronchitis ([place]); Respiratory failure ([place]); Palliative care by specialist; Acute respiratory distress; and Lumbar spondylosis on their problem list. Her primarily concern today is the Medication Refill  Pain Assessment: Location: Lower Back Radiating: Denies at the moment  Onset: More than a month ago Duration: Chronic pain Quality: Constant, Discomfort("Annoying") Severity: 3 /10 (subjective, self-reported pain score)  Note: Reported level is compatible with  observation.                          Effect on ADL: "With my medication I'm okay"  Timing: Constant Modifying factors: Medications BP: 105/73  HR: 66  Elizabeth [name] was last scheduled for an appointment on 05/16/2018 for medication management. During today's appointment we reviewed Elizabeth [name]'s chronic pain status, as well as her outpatient medication regimen.  She admits that she was just recently discharged from the hospital for COPD exacerbation.  He admits that she is no longer smoking and has been quit since July.  She denies any pain related concerns today.  However she is weak  The patient  reports that she does not use drugs. Her body mass index is 28.13 kg/m.  Further details on both, my assessment(s), as well as the proposed treatment plan, please see below.  Controlled Substance Pharmacotherapy Assessment REMS (Risk Evaluation and Mitigation Strategy)  Analgesic:Oxycodone IR 5 mg 1 tablet by mouth every 6 hours (20 mg/day) MME/day:30 mg/day Janne Napoleon, RN  08/22/2018  2:12 PM  Sign at close encounter Nursing Pain Medication Assessment:  Safety precautions to be maintained throughout the outpatient stay will include: orient to surroundings, keep bed in low position, maintain call bell within reach at all times, provide assistance with transfer out of bed and ambulation.   Medication Inspection Compliance: Pill count conducted under aseptic conditions, in front of the patient. Neither the pills nor the bottle was removed from the patient's sight at any time. Once count was completed pills were immediately returned to the patient in their original bottle.  Medication: Oxycodone HCL Pill/Patch Count: 55 of 120 pills remain Pill/Patch Appearance: Markings consistent with prescribed medication Bottle Appearance: Standard pharmacy container. Clearly labeled. Filled Date: 10 / 26 / 2019 Last Medication intake:  Today   Pharmacokinetics: Liberation  and absorption (onset of  action): WNL Distribution (time to peak effect): WNL Metabolism and excretion (duration of action): WNL         Pharmacodynamics: Desired effects: Analgesia: Elizabeth [name] reports >50% benefit. Functional ability: Patient reports that medication allows her to accomplish basic ADLs Clinically meaningful improvement in function (CMIF): Sustained CMIF goals met Perceived effectiveness: Described as relatively effective, allowing for increase in activities of daily living (ADL) Undesirable effects: Side-effects or Adverse reactions: None reported Monitoring: [place] PMP: Online review of the past 4-monthperiod conducted. Compliant with practice rules and regulations Last UDS on record: Summary  Date Value Ref Range Status  05/16/2018 FINAL  Final    Comment:    ==================================================================== TOXASSURE SELECT 13 (MW) ==================================================================== Specimen Alert Note:  Urinary creatinine is low; ability to detect some drugs may be compromised.  Interpret results with caution. ==================================================================== Test                             Result       Flag       Units Drug Present and Declared for Prescription Verification   Oxycodone                      2816         EXPECTED   ng/mg creat   Oxymorphone                    789          EXPECTED   ng/mg creat   Noroxycodone                   2511         EXPECTED   ng/mg creat   Noroxymorphone                 311          EXPECTED   ng/mg creat    Sources of oxycodone are scheduled prescription medications.    Oxymorphone, noroxycodone, and noroxymorphone are expected    metabolites of oxycodone. Oxymorphone is also available as a    scheduled prescription medication. ==================================================================== Test                      Result    Flag   Units      Ref Range   Creatinine              19         L      mg/dL      >=20 ==================================================================== Declared Medications:  The flagging and interpretation on this report are based on the  following declared medications.  Unexpected results may arise from  inaccuracies in the declared medications.  **Note: The testing scope of this panel includes these medications:  Oxycodone  **Note: The testing scope of this panel does not include following  reported medications:  Albuterol  Albuterol (Duoneb)  Atorvastatin (Lipitor)  Carvedilol (Coreg)  Furosemide (Lasix)  Ipratropium (Duoneb)  Melatonin  Omeprazole (Prilosec)  Polyethylene Glycol  Potassium ==================================================================== For clinical consultation, please call ([phone] ====================================================================    UDS interpretation: Compliant          Medication Assessment Form: Reviewed. Patient indicates being compliant with therapy Treatment compliance: Compliant Risk Assessment Profile: Aberrant behavior: See prior evaluations. None observed or detected today Comorbid factors increasing risk of overdose:  See prior notes. No additional risks detected today Opioid risk tool (ORT) (Total Score): 0 Personal History of Substance Abuse (SUD-Substance use disorder):  Alcohol: Negative  Illegal Drugs: Negative  Rx Drugs: Negative  ORT Risk Level calculation: Low Risk Risk of substance use disorder (SUD): Low Opioid Risk Tool - 08/22/18 1409      Family History of Substance Abuse   Alcohol  Negative    Illegal Drugs  Negative    Rx Drugs  Negative      Personal History of Substance Abuse   Alcohol  Negative    Illegal Drugs  Negative    Rx Drugs  Negative      Age   Age between 32-45 years   No      History of Preadolescent Sexual Abuse   History of Preadolescent Sexual Abuse  Negative or Female      Psychological Disease   Psychological Disease   Negative    Depression  Negative      Total Score   Opioid Risk Tool Scoring  0    Opioid Risk Interpretation  Low Risk      ORT Scoring interpretation table:  Score <3 = Low Risk for SUD  Score between 4-7 = Moderate Risk for SUD  Score >8 = High Risk for Opioid Abuse   Risk Mitigation Strategies:  Patient Counseling: Covered Patient-Prescriber Agreement (PPA): Present and active  Notification to other healthcare providers: Done  Pharmacologic Plan: No change in therapy, at this time.             Laboratory Chemistry  Inflammation Markers (CRP: Acute Phase) (ESR: Chronic Phase) Lab Results  Component Value Date   CRP 4.4 08/30/2017   ESRSEDRATE 30 08/30/2017                         Rheumatology Markers No results found for: RF, ANA, LABURIC, URICUR, LYMEIGGIGMAB, LYMEABIGMQN, HLAB27                      Renal Function Markers Lab Results  Component Value Date   BUN 25 (H) 08/18/2018   CREATININE 0.76 08/18/2018   BCR 17 08/30/2017   GFRAA >60 08/18/2018   GFRNONAA >60 08/18/2018                             Hepatic Function Markers Lab Results  Component Value Date   AST 22 08/14/2018   ALT 19 08/14/2018   ALBUMIN 4.1 08/14/2018   ALKPHOS 121 08/14/2018                        Electrolytes Lab Results  Component Value Date   NA 140 08/18/2018   K 4.1 08/18/2018   CL 103 08/18/2018   CALCIUM 8.7 (L) 08/18/2018   MG 2.6 (H) 04/18/2018   PHOS 4.1 04/16/2018                        Neuropathy Markers Lab Results  Component Value Date   VITAMINB12 284 08/30/2017   HIV Non Reactive 04/12/2018                        CNS Tests No results found for: COLORCSF, APPEARCSF, RBCCOUNTCSF, WBCCSF, POLYSCSF, LYMPHSCSF, EOSCSF, PROTEINCSF, GLUCCSF, JCVIRUS, CSFOLI, IGGCSF  Bone Pathology Markers Lab Results  Component Value Date   VD25OH 25.3 (L) 03/18/2016   VD125OH2TOT 76.2 03/18/2016   25OHVITD1 37 08/30/2017   25OHVITD2 1.0 08/30/2017    25OHVITD3 36 08/30/2017                         Coagulation Parameters Lab Results  Component Value Date   PLT 211 08/14/2018                        Cardiovascular Markers Lab Results  Component Value Date   BNP 19.0 08/14/2018   CKTOTAL 55 03/10/2014   CKMB < 0.5 (L) 02/16/2014   TROPONINI <0.03 04/11/2018   HGB 12.9 08/14/2018   HCT 38.5 08/14/2018                         CA Markers No results found for: CEA, CA125, LABCA2                      Note: Lab results reviewed.  Recent Diagnostic Imaging Results  DG Chest Portable 1 View CLINICAL DATA:  Mid chest pain, wheezing, tightness  EXAM: PORTABLE CHEST 1 VIEW  COMPARISON:  CT chest 06/21/2018  FINDINGS: There is no focal parenchymal opacity. There is no pleural effusion or pneumothorax. The heart and mediastinal contours are unremarkable.  The osseous structures are unremarkable.  IMPRESSION: No active disease.  Electronically Signed   By: Kathreen Devoid   On: 08/14/2018 15:05  Complexity Note: Imaging results reviewed. Results shared with Elizabeth [name], using Layman's terms.                         Meds   Current Outpatient Medications:  .  albuterol (PROVENTIL HFA;VENTOLIN HFA) 108 (90 Base) MCG/ACT inhaler, Inhale 2 puffs into the lungs every 6 (six) hours as needed for wheezing or shortness of breath., Disp: 1 Inhaler, Rfl: 2 .  atorvastatin (LIPITOR) 40 MG tablet, Take 1 tablet (40 mg total) by mouth at bedtime., Disp: 30 tablet, Rfl: 2 .  carvedilol (COREG) 3.125 MG tablet, Take 1 tablet (3.125 mg total) by mouth 2 (two) times daily with a meal., Disp: 60 tablet, Rfl: 1 .  chlorpheniramine-HYDROcodone (TUSSIONEX) 10-8 MG/5ML SUER, Take 5 mLs by mouth every 12 (twelve) hours as needed for cough., Disp: 115 mL, Rfl: 0 .  furosemide (LASIX) 20 MG tablet, Take 1 tablet (20 mg total) by mouth daily., Disp: 30 tablet, Rfl: 2 .  ipratropium-albuterol (DUONEB) 0.5-2.5 (3) MG/3ML SOLN, Take 3 mLs by  nebulization every 6 (six) hours as needed (wheezing)., Disp: 360 mL, Rfl: 1 .  Melatonin 1 MG TABS, Take 1-5 mg by mouth at bedtime as needed (sleep). , Disp: , Rfl:  .  methocarbamol (ROBAXIN) 500 MG tablet, Take 1 tablet (500 mg total) by mouth every 6 (six) hours as needed for muscle spasms., Disp: 12 tablet, Rfl: 0 .  mometasone-formoterol (DULERA) 100-5 MCG/ACT AERO, Inhale 2 puffs into the lungs 2 (two) times daily., Disp: 1 Inhaler, Rfl: 2 .  omeprazole (PRILOSEC) 20 MG capsule, Take 20 mg by mouth daily. , Disp: , Rfl:  .  [START ON 11/04/2018] oxyCODONE (OXY IR/ROXICODONE) 5 MG immediate release tablet, Take 1 tablet (5 mg total) by mouth every 6 (six) hours as needed for severe pain., Disp: 120 tablet, Rfl: 0 .  polyethylene  glycol (MIRALAX / GLYCOLAX) packet, Take 17 g by mouth daily. (Patient taking differently: Take 17 g by mouth daily as needed for mild constipation or moderate constipation. ), Disp: 14 each, Rfl: 0 .  potassium chloride SA (K-DUR,KLOR-CON) 20 MEQ tablet, Take 20 mEq by mouth daily., Disp: , Rfl:  .  predniSONE (DELTASONE) 20 MG tablet, Take 2 tablets (40 mg total) by mouth daily for 5 days., Disp: 10 tablet, Rfl: 0 .  [START ON 10/05/2018] oxyCODONE (OXY IR/ROXICODONE) 5 MG immediate release tablet, Take 1 tablet (5 mg total) by mouth every 6 (six) hours as needed for severe pain., Disp: 120 tablet, Rfl: 0 .  [START ON 09/05/2018] oxyCODONE (OXY IR/ROXICODONE) 5 MG immediate release tablet, Take 1 tablet (5 mg total) by mouth every 6 (six) hours as needed for severe pain., Disp: 120 tablet, Rfl: 0  ROS  Constitutional: Denies any fever or chills Gastrointestinal: No reported hemesis, hematochezia, vomiting, or acute GI distress Musculoskeletal: Denies any acute onset joint swelling, redness, loss of ROM, or weakness Neurological: No reported episodes of acute onset apraxia, aphasia, dysarthria, agnosia, amnesia, paralysis, loss of coordination, or loss of  consciousness  Allergies  Elizabeth [name] is allergic to lisinopril.  PFSH  Drug: Elizabeth [name]  reports that she does not use drugs. Alcohol:  reports that she does not drink alcohol. Tobacco:  reports that she quit smoking about 4 months ago. Her smoking use included cigarettes. She has a 40.00 pack-year smoking history. She has never used smokeless tobacco. Medical:  has a past medical history of Anxiety, Arm pain (07/25/2015), CHF (congestive heart failure) ([place]), Congestive heart failure ([place]) (07/25/2015), COPD (chronic obstructive pulmonary disease) ([place]), Depression, Lower extremity pain (07/25/2015), Reflux, RSD (reflex sympathetic dystrophy), Spinal cord stimulator status, Vitamin B12 deficiency, and Vitamin D deficiency. Surgical: Elizabeth [name]  has a past surgical history that includes Spinal cord stimulator implant. Family: family history includes Cancer in her father; Cirrhosis in her mother.  Constitutional Exam  General appearance: Well nourished, well developed, and well hydrated. In no apparent acute distress Vitals:   08/22/18 1403  BP: 105/73  Pulse: 66  Resp: 18  Temp: 98.4 F (36.9 C)  SpO2: 97%  Weight: 130 lb (59 kg)  Height: '4\' 9"'$  (1.448 m)  Psych/Mental status: Alert, oriented x 3 (person, place, & time)       Eyes: PERLA Respiratory: No evidence of acute respiratory distress   Lumbar Spine Area Exam  Skin & Axial Inspection: No masses, redness, or swelling CVS Alignment: Symmetrical Functional ROM: Unrestricted ROM       Stability: No instability detected Muscle Tone/Strength: Functionally intact. No obvious neuro-muscular anomalies detected. Sensory (Neurological): Unimpaired Palpation: No palpable anomalies       Provocative Tests: Hyperextension/rotation test: deferred today       Lumbar quadrant test (Kemp's test): deferred today       Lateral bending test: deferred today       Patrick's Maneuver: deferred today                   FABER test: deferred  today                   S-I anterior distraction/compression test: deferred today         S-I lateral compression test: deferred today         S-I Thigh-thrust test: deferred today         S-I Gaenslen's test: deferred today  Gait & Posture Assessment  Ambulation: Unassisted Gait: Relatively normal for age and body habitus Posture: WNL   Lower Extremity Exam    Side: Right lower extremity  Side: Left lower extremity  Stability: No instability observed          Stability: No instability observed          Skin & Extremity Inspection: Skin color, temperature, and hair growth are WNL. No peripheral edema or cyanosis. No masses, redness, swelling, asymmetry, or associated skin lesions. No contractures.  Skin & Extremity Inspection: Skin color, temperature, and hair growth are WNL. No peripheral edema or cyanosis. No masses, redness, swelling, asymmetry, or associated skin lesions. No contractures.  Functional ROM: Unrestricted ROM                  Functional ROM: Unrestricted ROM                  Muscle Tone/Strength: Functionally intact. No obvious neuro-muscular anomalies detected.  Muscle Tone/Strength: Functionally intact. No obvious neuro-muscular anomalies detected.  Sensory (Neurological): Unimpaired  Sensory (Neurological): Unimpaired  Palpation: No palpable anomalies  Palpation: No palpable anomalies   Assessment  Primary Diagnosis & Pertinent Problem List: The primary encounter diagnosis was Lumbar spondylosis. Diagnoses of Lumbar facet syndrome (Bilateral), Complex regional pain syndrome type 1 of right upper extremity, Complex regional pain syndrome type 1 of both lower extremities, and Chronic pain syndrome were also pertinent to this visit.  Status Diagnosis  Controlled Controlled Controlled 1. Lumbar spondylosis   2. Lumbar facet syndrome (Bilateral)   3. Complex regional pain syndrome type 1 of right upper extremity   4. Complex regional pain syndrome type 1 of both  lower extremities   5. Chronic pain syndrome     Problems updated and reviewed during this visit: Problem  Lumbar Spondylosis   Plan of Care  Pharmacotherapy (Medications Ordered): Meds ordered this encounter  Medications  . oxyCODONE (OXY IR/ROXICODONE) 5 MG immediate release tablet    Sig: Take 1 tablet (5 mg total) by mouth every 6 (six) hours as needed for severe pain.    Dispense:  120 tablet    Refill:  0    Do not place this medication, or any other prescription from our practice, on "Automatic Refill". Patient may have prescription filled one day early if pharmacy is closed on scheduled refill date.    Order Specific Question:   Supervising Provider    Answer:   Milinda Pointer [phone]  . oxyCODONE (OXY IR/ROXICODONE) 5 MG immediate release tablet    Sig: Take 1 tablet (5 mg total) by mouth every 6 (six) hours as needed for severe pain.    Dispense:  120 tablet    Refill:  0    Do not place this medication, or any other prescription from our practice, on "Automatic Refill". Patient may have prescription filled one day early if pharmacy is closed on scheduled refill date.    Order Specific Question:   Supervising Provider    Answer:   Milinda Pointer [phone]  . oxyCODONE (OXY IR/ROXICODONE) 5 MG immediate release tablet    Sig: Take 1 tablet (5 mg total) by mouth every 6 (six) hours as needed for severe pain.    Dispense:  120 tablet    Refill:  0    Do not add this medication to the electronic "Automatic Refill" notification system. Patient may have prescription filled one day early if pharmacy is closed on scheduled refill date.  Order Specific Question:   Supervising Provider    Answer:   Milinda Pointer [[id number]]   New Prescriptions   No medications on file   Medications administered today: Elizabeth [name] had no medications administered during this visit. Lab-work, procedure(s), and/or referral(s): No orders of the defined types were placed in this  encounter.  Imaging and/or referral(s): None Interventional therapies: Planned, scheduled, and/or pending:  Not at this time.   Considering:  Diagnostic bilateral lumbar facet block   Palliative PRN treatment(s):  Palliative stellate ganglion block for the upper extremity complex regional pain syndrome.  Palliative Lumbar sympathetic block for the lower extremity complex regional pain syndrome.  Palliativebilateral lumbar facet block    Provider-requested follow-up: Return in about 3 months (around 11/22/2018) for MedMgmt.  Future Appointments  Date Time Provider [place]  08/24/2018 11:20 AM Alisa Graff, [place] [facility] None   Primary Care Physician: [name], NP Location: [facility] Note by: Vevelyn Francois NP Date: 08/22/2018; Time: 2:55 PM  Pain Score Disclaimer: We use the NRS-11 scale. This is a self-reported, subjective measurement of pain severity with only modest accuracy. It is used primarily to identify changes within a particular patient. It must be understood that outpatient pain scales are significantly less accurate that those used for research, where they can be applied under ideal controlled circumstances with minimal exposure to variables. In reality, the score is likely to be a combination of pain intensity and pain affect, where pain affect describes the degree of emotional arousal or changes in action readiness caused by the sensory experience of pain. Factors such as social and work situation, setting, emotional state, anxiety levels, expectation, and prior pain experience may influence pain perception and show large inter-individual differences that may also be affected by time variables.  Patient instructions provided during this appointment: Patient Instructions   ____________________________________________________________________________________________  Medication Rules  Applies to: All patients  receiving prescriptions (written or electronic).  Pharmacy of record: Pharmacy where electronic prescriptions will be sent. If written prescriptions are taken to a different pharmacy, please inform the nursing staff. The pharmacy listed in the electronic medical record should be the one where you would like electronic prescriptions to be sent.  Prescription refills: Only during scheduled appointments. Applies to both, written and electronic prescriptions.  NOTE: The following applies primarily to controlled substances (Opioid* Pain Medications).   Patient's responsibilities: 1. Pain Pills: Bring all pain pills to every appointment (except for procedure appointments). 2. Pill Bottles: Bring pills in original pharmacy bottle. Always bring newest bottle. Bring bottle, even if empty. 3. Medication refills: You are responsible for knowing and keeping track of what medications you need refilled. The day before your appointment, write a list of all prescriptions that need to be refilled. Bring that list to your appointment and give it to the admitting nurse. Prescriptions will be written only during appointments. If you forget a medication, it will not be "Called in", "Faxed", or "electronically sent". You will need to get another appointment to get these prescribed. 4. Prescription Accuracy: You are responsible for carefully inspecting your prescriptions before leaving our office. Have the discharge nurse carefully go over each prescription with you, before taking them home. Make sure that your name is accurately spelled, that your address is correct. Check the name and dose of your medication to make sure it is accurate. Check the number of pills, and the written instructions to make sure they are clear and accurate. Make sure  that you are given enough medication to last until your next medication refill appointment. 5. Taking Medication: Take medication as prescribed. Never take more pills than instructed.  Never take medication more frequently than prescribed. Taking less pills or less frequently is permitted and encouraged, when it comes to controlled substances (written prescriptions).  6. Inform other Doctors: Always inform, all of your healthcare providers, of all the medications you take. 7. Pain Medication from other Providers: You are not allowed to accept any additional pain medication from any other Doctor or Healthcare provider. There are two exceptions to this rule. (see below) In the event that you require additional pain medication, you are responsible for notifying us, as stated below. 8. Medication Agreement: You are responsible for carefully reading and following our Medication Agreement. This must be signed before receiving any prescriptions from our practice. Safely store a copy of your signed Agreement. Violations to the Agreement will result in no further prescriptions. (Additional copies of our Medication Agreement are available upon request.) 9. Laws, Rules, & Regulations: All patients are expected to follow all Federal and [employer], [employer], Rules, [employer]. Ignorance of the Laws does not constitute a valid excuse. The use of any illegal substances is prohibited. 10. Adopted CDC guidelines & recommendations: Target dosing levels will be at or below 60 MME/day. Use of benzodiazepines** is not recommended.  Exceptions: There are only two exceptions to the rule of not receiving pain medications from other Healthcare Providers. 1. Exception #1 (Emergencies): In the event of an emergency (i.e.: accident requiring emergency care), you are allowed to receive additional pain medication. However, you are responsible for: As soon as you are able, call our office [phone], at any time of the day or night, and leave a message stating your name, the date and nature of the emergency, and the name and dose of the medication prescribed. In the event that your call is answered by a member  of our staff, make sure to document and save the date, time, and the name of the person that took your information.  2. Exception #2 (Planned Surgery): In the event that you are scheduled by another doctor or dentist to have any type of surgery or procedure, you are allowed (for a period no longer than 30 days), to receive additional pain medication, for the acute post-op pain. However, in this case, you are responsible for picking up a copy of our "Post-op Pain Management for Surgeons" handout, and giving it to your surgeon or dentist. This document is available at our office, and does not require an appointment to obtain it. Simply go to our office during business hours (Monday-Thursday from 8:00 AM to 4:00 PM) (Friday 8:00 AM to 12:00 Noon) or if you have a scheduled appointment with [place], prior to your surgery, and ask for it by name. In addition, you will need to provide [place] with your name, name of your surgeon, type of surgery, and date of procedure or surgery.  *Opioid medications include: morphine, codeine, oxycodone, oxymorphone, hydrocodone, hydromorphone, meperidine, tramadol, tapentadol, buprenorphine, fentanyl, methadone. **Benzodiazepine medications include: diazepam (Valium), alprazolam (Xanax), clonazepam (Klonopine), lorazepam (Ativan), clorazepate (Tranxene), chlordiazepoxide (Librium), estazolam (Prosom), oxazepam (Serax), temazepam (Restoril), triazolam (Halcion) (Last updated: 12/09/2017) ____________________________________________________________________________________________   BMI Assessment: Estimated body mass index is 28.13 kg/m as calculated from the following:   Height as of this encounter: '4\' 9"'$  (1.448 m).   Weight as of this encounter: 130 lb (59 kg).  BMI interpretation table: BMI level Category  Range association with higher incidence of chronic pain  <18 kg/m2 Underweight   18.5-24.9 kg/m2 Ideal body weight   25-29.9 kg/m2 Overweight Increased incidence by 20%   30-34.9 kg/m2 Obese (Class I) Increased incidence by 68%  35-39.9 kg/m2 Severe obesity (Class II) Increased incidence by 136%  >40 kg/m2 Extreme obesity (Class III) Increased incidence by 254%   Patient's current BMI Ideal Body weight  Body mass index is 28.13 kg/m. Patient must be at least 60 in tall to calculate ideal body weight   BMI Readings from Last 4 Encounters:  08/22/18 28.13 kg/m  08/17/18 30.63 kg/m  06/21/18 28.13 kg/m  05/24/18 25.97 kg/m   Wt Readings from Last 4 Encounters:  08/22/18 130 lb (59 kg)  08/17/18 141 lb 8.6 oz (64.2 kg)  06/21/18 130 lb (59 kg)  05/24/18 120 lb (54.4 kg)

## 2018-08-23 NOTE — Progress Notes (Signed)
[name], female    DOB: September 19, 1961, 57 y.o.   MRN: [id number]  HPI  Elizabeth [name] is a 57 y/o female with a history of HTN, depression, anxiety, COPD, spinal cord stimulator, previous tobacco use and chronic heart failure.   Echo report from 04/13/18 reviewed and showed an EF of 45%.  Admitted 08/14/18 due to acute on chronic COPD. Initially needed IV steroids. Discharged after 4 days. Was in the ED 05/24/18 due to left flank pain where she was treated and released.   She presents today for her initial visit with a chief complaint of chronic difficulty sleeping. She says that she's had problems with this for years. She has associated chronic back pain along with this. She denies any fatigue, cough, shortness of breath, chest pain, pedal edema, palpitations, abdominal distention, dizziness, easy bruising or weight gain.   Past Medical History:  Diagnosis Date  . Anxiety   . Arm pain 07/25/2015  . CHF (congestive heart failure) (HCC)   . Congestive heart failure (HCC) 07/25/2015  . COPD (chronic obstructive pulmonary disease) (HCC)    never been idagnosed  . Depression   . Hypertension   . Lower extremity pain 07/25/2015  . Reflux   . RSD (reflex sympathetic dystrophy)   . Spinal cord stimulator status   . Vitamin B12 deficiency   . Vitamin D deficiency    Past Surgical History:  Procedure Laterality Date  . SPINAL CORD STIMULATOR IMPLANT     x 2   Family History  Problem Relation Age of Onset  . Cirrhosis Mother   . Cancer Father    Social History   Tobacco Use  . Smoking status: Former Smoker    Packs/day: 1.00    Years: 40.00    Pack years: 40.00    Types: Cigarettes    Last attempt to quit: 04/11/2018    Years since quitting: 0.3  . Smokeless tobacco: Never Used  Substance Use Topics  . Alcohol use: No    Alcohol/week: 0.0 standard drinks   Allergies  Allergen Reactions  . Lisinopril Anaphylaxis   Prior to Admission medications    Medication Sig Start Date End Date Taking? Authorizing Provider  albuterol (PROVENTIL HFA;VENTOLIN HFA) 108 (90 Base) MCG/ACT inhaler Inhale 2 puffs into the lungs every 6 (six) hours as needed for wheezing or shortness of breath. 11/26/17  Yes Enid Baas, MD  atorvastatin (LIPITOR) 40 MG tablet Take 1 tablet (40 mg total) by mouth at bedtime. 08/18/18  Yes Enid Baas, MD  carvedilol (COREG) 3.125 MG tablet Take 1 tablet (3.125 mg total) by mouth 2 (two) times daily with a meal. 08/18/18  Yes Enid Baas, MD  chlorpheniramine-HYDROcodone (TUSSIONEX) 10-8 MG/5ML SUER Take 5 mLs by mouth every 12 (twelve) hours as needed for cough. 08/18/18  Yes Enid Baas, MD  furosemide (LASIX) 20 MG tablet Take 1 tablet (20 mg total) by mouth daily. 08/18/18  Yes Enid Baas, MD  ipratropium-albuterol (DUONEB) 0.5-2.5 (3) MG/3ML SOLN Take 3 mLs by nebulization every 6 (six) hours as needed (wheezing). 08/18/18  Yes Enid Baas, MD  Melatonin 1 MG TABS Take 1-5 mg by mouth at bedtime as needed (sleep).    Yes [provider]  methocarbamol (ROBAXIN) 500 MG tablet Take 1 tablet (500 mg total) by mouth every 6 (six) hours as needed for muscle spasms. 05/24/18  Yes Summers, Rhonda L, PA-C  mometasone-formoterol (DULERA) 100-5 MCG/ACT AERO Inhale 2 puffs into the lungs 2 (two)  times daily. 08/18/18 09/17/18 Yes Enid Baas, MD  omeprazole (PRILOSEC) 20 MG capsule Take 20 mg by mouth daily.    Yes [provider]  oxyCODONE (OXY IR/ROXICODONE) 5 MG immediate release tablet Take 1 tablet (5 mg total) by mouth every 6 (six) hours as needed for severe pain. 11/04/18 12/04/18 Yes Barbette Merino, NP  oxyCODONE (OXY IR/ROXICODONE) 5 MG immediate release tablet Take 1 tablet (5 mg total) by mouth every 6 (six) hours as needed for severe pain. 10/05/18 11/04/18 Yes Barbette Merino, NP  oxyCODONE (OXY IR/ROXICODONE) 5 MG immediate release tablet Take 1 tablet (5 mg total)  by mouth every 6 (six) hours as needed for severe pain. 09/05/18 10/05/18 Yes King, Shana Chute, NP  polyethylene glycol (MIRALAX / GLYCOLAX) packet Take 17 g by mouth daily. Patient taking differently: Take 17 g by mouth daily as needed for mild constipation or moderate constipation.  11/27/17  Yes Enid Baas, MD  potassium chloride SA (K-DUR,KLOR-CON) 20 MEQ tablet Take 20 mEq by mouth daily.   Yes [provider]   Review of Systems  Constitutional: Negative for appetite change and fatigue.  HENT: Negative for congestion, postnasal drip and sore throat.   Eyes: Negative.   Respiratory: Negative for cough, chest tightness and shortness of breath.   Cardiovascular: Negative for chest pain, palpitations and leg swelling.  Gastrointestinal: Negative for abdominal distention and abdominal pain.  Endocrine: Negative.   Genitourinary: Negative.   Musculoskeletal: Positive for back pain.  Skin: Negative.   Allergic/Immunologic: Negative.   Neurological: Negative for dizziness and light-headedness.  Hematological: Negative for adenopathy. Does not bruise/bleed easily.  Psychiatric/Behavioral: Positive for sleep disturbance (chronic issue). Negative for dysphoric mood. The patient is not nervous/anxious.     Vitals:   08/24/18 1115  BP: 116/60  Pulse: 64  Resp: 18  SpO2: 97%  Weight: 141 lb (64 kg)  Height: 4\' 9"  (1.448 m)   Wt Readings from Last 3 Encounters:  08/24/18 141 lb (64 kg)  08/22/18 130 lb (59 kg)  08/17/18 141 lb 8.6 oz (64.2 kg)   Lab Results  Component Value Date   CREATININE 0.76 08/18/2018   CREATININE 0.90 08/16/2018   CREATININE 0.73 08/14/2018   Physical Exam  Constitutional: She is oriented to person, place, and time. She appears well-developed and well-nourished.  HENT:  Head: Normocephalic and atraumatic.  Neck: Normal range of motion. Neck supple. No JVD present.  Cardiovascular: Normal rate and regular rhythm.  Pulmonary/Chest: Effort  normal. She has no wheezes. She has no rales.  Abdominal: Soft. She exhibits no distension. There is no tenderness.  Musculoskeletal: She exhibits no edema or tenderness.  Neurological: She is alert and oriented to person, place, and time.  Skin: Skin is warm and dry.  Psychiatric: She has a normal mood and affect. Her behavior is normal. Thought content normal.  Nursing note and vitals reviewed.  Assessment & Plan:  1: Chronic heart failure with reduced ejection fraction- - NYHA class I - euvolemic today - weighing daily; instructed to call for an overnight weight gain of >2 pounds or a weekly weight gain of >5 pounds - does add salt to some foods and hasn't been reading food labels. Discussed the importance of reading food labels so that she can follow a 2000mg  sodium diet. Written dietary information was also provided about this.  - EF mildly depressed but >40% so would not qualify for entresto plus 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 - BNP 08/14/18  was 19.0 - patient does not get the flu vaccine  2: HTN- - BP looks good today - sees PCP at [facility] - BMP 08/18/18 reviewed and showed sodium 140, potassium 4.1, creatinine 0.76 and GFR >60  3: COPD- - using inhalers and nebulizer  Patient did not bring her medications nor a list. Each medication was verbally reviewed with the patient and she was encouraged to bring the bottles to every visit to confirm accuracy of list.  Return in 1 month or sooner for any questions/problems before then.

## 2018-08-24 ENCOUNTER — Encounter: Payer: Self-pay | Admitting: Family

## 2018-08-24 ENCOUNTER — Ambulatory Visit: Payer: Medicaid Other | Attending: Family | Admitting: Family

## 2018-08-24 VITALS — BP 116/60 | HR 64 | Resp 18 | Ht <= 58 in | Wt 141.0 lb

## 2018-08-24 DIAGNOSIS — J410 Simple chronic bronchitis: Secondary | ICD-10-CM

## 2018-08-24 DIAGNOSIS — M549 Dorsalgia, unspecified: Secondary | ICD-10-CM | POA: Diagnosis not present

## 2018-08-24 DIAGNOSIS — J441 Chronic obstructive pulmonary disease with (acute) exacerbation: Secondary | ICD-10-CM | POA: Diagnosis not present

## 2018-08-24 DIAGNOSIS — E559 Vitamin D deficiency, unspecified: Secondary | ICD-10-CM | POA: Diagnosis not present

## 2018-08-24 DIAGNOSIS — I5022 Chronic systolic (congestive) heart failure: Secondary | ICD-10-CM | POA: Insufficient documentation

## 2018-08-24 DIAGNOSIS — Z87891 Personal history of nicotine dependence: Secondary | ICD-10-CM | POA: Insufficient documentation

## 2018-08-24 DIAGNOSIS — F419 Anxiety disorder, unspecified: Secondary | ICD-10-CM | POA: Diagnosis not present

## 2018-08-24 DIAGNOSIS — K219 Gastro-esophageal reflux disease without esophagitis: Secondary | ICD-10-CM | POA: Insufficient documentation

## 2018-08-24 DIAGNOSIS — Z888 Allergy status to other drugs, medicaments and biological substances status: Secondary | ICD-10-CM | POA: Insufficient documentation

## 2018-08-24 DIAGNOSIS — F329 Major depressive disorder, single episode, unspecified: Secondary | ICD-10-CM | POA: Insufficient documentation

## 2018-08-24 DIAGNOSIS — G479 Sleep disorder, unspecified: Secondary | ICD-10-CM | POA: Insufficient documentation

## 2018-08-24 DIAGNOSIS — Z79899 Other long term (current) drug therapy: Secondary | ICD-10-CM | POA: Insufficient documentation

## 2018-08-24 DIAGNOSIS — E538 Deficiency of other specified B group vitamins: Secondary | ICD-10-CM | POA: Diagnosis not present

## 2018-08-24 DIAGNOSIS — G8929 Other chronic pain: Secondary | ICD-10-CM | POA: Diagnosis not present

## 2018-08-24 DIAGNOSIS — J449 Chronic obstructive pulmonary disease, unspecified: Secondary | ICD-10-CM | POA: Insufficient documentation

## 2018-08-24 DIAGNOSIS — I110 Hypertensive heart disease with heart failure: Secondary | ICD-10-CM | POA: Diagnosis not present

## 2018-08-24 DIAGNOSIS — I10 Essential (primary) hypertension: Secondary | ICD-10-CM | POA: Insufficient documentation

## 2018-08-24 DIAGNOSIS — Z79891 Long term (current) use of opiate analgesic: Secondary | ICD-10-CM | POA: Diagnosis not present

## 2018-08-24 NOTE — Patient Instructions (Signed)
Continue weighing daily and call for an overnight weight gain of > 2 pounds or a weekly weight gain of >5 pounds. 

## 2018-10-06 ENCOUNTER — Telehealth: Payer: Self-pay

## 2018-10-06 NOTE — Telephone Encounter (Signed)
PA sent via [employer] [employer]. 

## 2018-10-06 NOTE — Telephone Encounter (Signed)
Needs a prior authorization for medications.  Did not specify which one.

## 2018-10-10 ENCOUNTER — Ambulatory Visit: Payer: Medicaid Other | Admitting: Family

## 2018-10-11 ENCOUNTER — Telehealth: Payer: Self-pay | Admitting: Family

## 2018-10-11 NOTE — Telephone Encounter (Signed)
Attempted to call patient to inform her that her Pa was done on 10-06-18 but patient does not have voicemail set Up.

## 2018-10-11 NOTE — Telephone Encounter (Signed)
Patient did not show for her [facility] appointment on 10/10/18. Will attempt to reschedule.  

## 2018-10-11 NOTE — Telephone Encounter (Signed)
Patient lvmail on Mon 10-10-18 at 2:24 stating she still needs PA for Oxycodone thru medicaid

## 2018-10-18 ENCOUNTER — Telehealth: Payer: Self-pay | Admitting: Nurse Practitioner

## 2018-10-18 NOTE — Telephone Encounter (Signed)
PA sent via [employer] with notes 10/18/18

## 2018-10-18 NOTE — Telephone Encounter (Signed)
Pt states that she needs a PA for her medication. She uses [facility].

## 2018-11-04 ENCOUNTER — Telehealth: Payer: Self-pay | Admitting: Nurse Practitioner

## 2018-11-04 NOTE — Telephone Encounter (Signed)
PA sent today.  Pt notified.

## 2018-11-04 NOTE — Telephone Encounter (Signed)
Pt left a message stating that her medication needs PA.

## 2018-11-22 ENCOUNTER — Encounter: Payer: Self-pay | Admitting: Nurse Practitioner

## 2018-11-22 ENCOUNTER — Ambulatory Visit: Payer: Medicaid Other | Attending: Nurse Practitioner | Admitting: Nurse Practitioner

## 2018-11-22 ENCOUNTER — Other Ambulatory Visit: Payer: Self-pay

## 2018-11-22 VITALS — BP 147/79 | HR 71 | Temp 98.3°F | Resp 18 | Ht <= 58 in | Wt 140.0 lb

## 2018-11-22 DIAGNOSIS — G90511 Complex regional pain syndrome I of right upper limb: Secondary | ICD-10-CM

## 2018-11-22 DIAGNOSIS — G894 Chronic pain syndrome: Secondary | ICD-10-CM | POA: Diagnosis not present

## 2018-11-22 DIAGNOSIS — M47816 Spondylosis without myelopathy or radiculopathy, lumbar region: Secondary | ICD-10-CM | POA: Diagnosis not present

## 2018-11-22 DIAGNOSIS — Z79891 Long term (current) use of opiate analgesic: Secondary | ICD-10-CM

## 2018-11-22 MED ORDER — OXYCODONE HCL 5 MG PO TABS: 5.0000 mg | ORAL_TABLET | Freq: Four times a day (QID) | ORAL | 0 refills | Status: DC | PRN

## 2018-11-22 NOTE — Progress Notes (Signed)
Nursing Pain Medication Assessment:  Safety precautions to be maintained throughout the outpatient stay will include: orient to surroundings, keep bed in low position, maintain call bell within reach at all times, provide assistance with transfer out of bed and ambulation.  Medication Inspection Compliance: Pill count conducted under aseptic conditions, in front of the patient. Neither the pills nor the bottle was removed from the patient's sight at any time. Once count was completed pills were immediately returned to the patient in their original bottle.  Medication: Oxycodone IR Pill/Patch Count: 42 of 120 pills remain Pill/Patch Appearance: Markings consistent with prescribed medication Bottle Appearance: Standard pharmacy container. Clearly labeled. Filled Date: 1 / 24 / 2020 Last Medication intake:  Today

## 2018-11-22 NOTE — Progress Notes (Addendum)
Patient's Name: Elizabeth [name]  MRN: [id number]  Referring Provider: Martie Round, NP  DOB: 1961/07/13  PCP: Martie Round, NP  DOS: 11/22/2018  Note by: Thad Ranger, NP  Service setting: Ambulatory outpatient  Specialty: Interventional Pain Management  Location: [facility] (AMB) Pain Management Facility    Patient type: Established   HPI  Reason for Visit: Ms. Malaijah Houchen is a 58 y.o. year old, female patient, who comes today with a chief complaint of Arm Pain (right) Last Appointment: She was last seen by me on 08/22/2018. Pain Assessment: Today, Ms. Stallings describes the severity of the Chronic pain as a 2 /10. She indicates the location/referral of the pain to be Arm Right/wrist/ hand. Onset was: More than a month ago. The quality of pain is described as Aching, Constant, Discomfort, Nagging. Temporal description, or timing of pain is: Constant. Possible modifying factors: Medications. Ms. Kawasaki describes the pain effects on ADL as: lifting, holding things for a long time.  Ms. Trigo's  height is 4\' 9"  (1.448 m) and weight is 140 lb (63.5 kg). Her temperature is 98.3 F (36.8 C). Her blood pressure is 147/79 (abnormal) and her pulse is 71. Her respiration is 18 and oxygen saturation is 98%. She denies any new concern today. She has gained some weight she feels like this is related to her 2 foster children. She denies any side effects of her medication.   ROS  Constitutional: Denies any fever or chills Gastrointestinal: No reported hemesis, hematochezia, vomiting, or acute GI distress Musculoskeletal: Denies any acute onset joint swelling, redness, loss of ROM, or weakness Neurological: No reported episodes of acute onset apraxia, aphasia, dysarthria, agnosia, amnesia, paralysis, loss of coordination, or loss of consciousness  Medication Review  Melatonin, albuterol, atorvastatin, carvedilol, chlorpheniramine-HYDROcodone, furosemide, ipratropium-albuterol, methocarbamol,  mometasone-formoterol, omeprazole, oxyCODONE, polyethylene glycol, and potassium chloride SA  History Review  Allergy: Ms. Heick is allergic to lisinopril. Drug: Ms. Klomp  reports no history of drug use. Alcohol:  reports no history of alcohol use. Tobacco:  reports that she quit smoking about 10 months ago. Her smoking use included cigarettes. She has a 40.00 pack-year smoking history. She has never used smokeless tobacco. Social: Ms. Duda  reports that she quit smoking about 10 months ago. Her smoking use included cigarettes. She has a 40.00 pack-year smoking history. She has never used smokeless tobacco. She reports that she does not drink alcohol or use drugs. Medical:  has a past medical history of Anxiety, Arm pain (07/25/2015), CHF (congestive heart failure) (HCC), Congestive heart failure (HCC) (07/25/2015), COPD (chronic obstructive pulmonary disease) (HCC), Depression, Hypertension, Lower extremity pain (07/25/2015), Reflux, RSD (reflex sympathetic dystrophy), Spinal cord stimulator status, Vitamin B12 deficiency, and Vitamin D deficiency. Surgical: Ms. Siler  has a past surgical history that includes Spinal cord stimulator implant. Family: family history includes Cancer in her father; Cirrhosis in her mother. Problem List: Ms. 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  Lab Review  Kidney Function Lab Results  Component Value Date   BUN 25 (H) 08/18/2018   CREATININE 0.76 08/18/2018   BCR 17 08/30/2017   GFRAA >60 08/18/2018   GFRNONAA >60 08/18/2018  Liver Function Lab Results  Component Value Date   AST 22 08/14/2018  ALT 19 08/14/2018   ALBUMIN 4.1  08/14/2018  Note: Above Lab results reviewed.  Imaging Review  DG Chest Portable 1 View CLINICAL DATA:  Mid chest pain, wheezing, tightness  EXAM: PORTABLE CHEST 1 VIEW  COMPARISON:  CT chest 06/21/2018  FINDINGS: There is no focal parenchymal opacity. There is no pleural effusion or pneumothorax. The heart and mediastinal contours are unremarkable.  The osseous structures are unremarkable.  IMPRESSION: No active disease.  Electronically Signed   By: Elige Ko   On: 08/14/2018 15:05 Note: Above imaging results reviewed.        Physical Exam  General appearance: Well nourished, well developed, and well hydrated. In no apparent acute distress Mental status: Alert, oriented x 3 (person, place, & time)       Respiratory: No evidence of acute respiratory distress Eyes: PERLA Vitals: BP (!) 147/79   Pulse 71   Temp 98.3 F (36.8 C)   Resp 18   Ht 4\' 9"  (1.448 m)   Wt 140 lb (63.5 kg)   SpO2 98%   BMI 30.30 kg/m  BMI: Estimated body mass index is 30.3 kg/m as calculated from the following:   Height as of this encounter: 4\' 9"  (1.448 m).   Weight as of this encounter: 140 lb (63.5 kg). Ideal: Patient must be at least 60 in tall to calculate ideal body weight Upper Extremity (UE) Exam    Side: Right upper extremity  Side: Left upper extremity  Skin & Extremity Inspection: Skin color, temperature, and hair growth are WNL. No peripheral edema or cyanosis. No masses, redness, swelling, asymmetry, or associated skin lesions. No contractures.  Skin & Extremity Inspection: Skin color, temperature, and hair growth are WNL. No peripheral edema or cyanosis. No masses, redness, swelling, asymmetry, or associated skin lesions. No contractures.  Functional ROM: Decreased ROM          Functional ROM: Unrestricted ROM          Muscle Tone/Strength: Functionally intact. No obvious neuro-muscular anomalies detected.  Muscle Tone/Strength: Functionally intact. No obvious neuro-muscular  anomalies detected.  Sensory (Neurological): Hyperesthesia (Increased sensitivity to touch)          Sensory (Neurological): Unimpaired          Palpation: No palpable anomalies              Palpation: No palpable anomalies                   Assessment   Status Diagnosis  Controlled Controlled Controlled 1. 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 Lumbar spondylosis   3. Chronic pain syndrome   4. Long term current use of opiate analgesic      Updated Problems: No problems updated.  Plan of Care  Medications: I have changed Deon L. Burrows's oxyCODONE. I am also having her maintain her omeprazole, potassium chloride SA, Melatonin, albuterol, polyethylene glycol, methocarbamol, mometasone-formoterol, chlorpheniramine-HYDROcodone, ipratropium-albuterol, furosemide, carvedilol, and atorvastatin.  Administered today: Marshea L. Spreen had no medications administered during this visit.  Orders:  No orders of the defined types were placed in this encounter. Interventional therapies: Planned, scheduled, and/or pending:  Not at this time.   Considering:  Diagnostic bilateral lumbar facet block   Palliative PRN treatment(s):  Palliative stellate ganglion block for the upper extremity complex regional pain syndrome.  Palliative Lumbar sympathetic block for the lower extremity complex regional pain syndrome.  Palliativebilateral lumbar facet block    Note  by: Thad Rangerrystal , NP Date: 11/22/2018; Time: 9:45 PM

## 2018-11-22 NOTE — Patient Instructions (Signed)
____________________________________________________________________________________________  Medication Rules  Purpose: To inform patients, and their family members, of our rules and regulations.  Applies to: All patients receiving prescriptions (written or electronic).  Pharmacy of record: Pharmacy where electronic prescriptions will be sent. If written prescriptions are taken to a different pharmacy, please inform the nursing staff. The pharmacy listed in the electronic medical record should be the one where you would like electronic prescriptions to be sent.  Electronic prescriptions: In compliance with the [place] Strengthen Opioid Misuse Prevention (STOP) Act of 2017 (Session Law 2017-74/H243), effective October 12, 2018, all controlled substances must be electronically prescribed. Calling prescriptions to the pharmacy will cease to exist.  Prescription refills: Only during scheduled appointments. Applies to all prescriptions.  NOTE: The following applies primarily to controlled substances (Opioid* Pain Medications).   Patient's responsibilities: 1. Pain Pills: Bring all pain pills to every appointment (except for procedure appointments). 2. Pill Bottles: Bring pills in original pharmacy bottle. Always bring the newest bottle. Bring bottle, even if empty. 3. Medication refills: You are responsible for knowing and keeping track of what medications you take and those you need refilled. The day before your appointment: write a list of all prescriptions that need to be refilled. The day of the appointment: give the list to the admitting nurse. Prescriptions will be written only during appointments. No prescriptions will be written on procedure days. If you forget a medication: it will not be "Called in", "Faxed", or "electronically sent". You will need to get another appointment to get these prescribed. No early refills. Do not call asking to have your prescription filled  early. 4. Prescription Accuracy: You are responsible for carefully inspecting your prescriptions before leaving our office. Have the discharge nurse carefully go over each prescription with you, before taking them home. Make sure that your name is accurately spelled, that your address is correct. Check the name and dose of your medication to make sure it is accurate. Check the number of pills, and the written instructions to make sure they are clear and accurate. Make sure that you are given enough medication to last until your next medication refill appointment. 5. Taking Medication: Take medication as prescribed. When it comes to controlled substances, taking less pills or less frequently than prescribed is permitted and encouraged. Never take more pills than instructed. Never take medication more frequently than prescribed.  6. Inform other Doctors: Always inform, all of your healthcare providers, of all the medications you take. 7. Pain Medication from other Providers: You are not allowed to accept any additional pain medication from any other Doctor or Healthcare provider. There are two exceptions to this rule. (see below) In the event that you require additional pain medication, you are responsible for notifying us, as stated below. 8. Medication Agreement: You are responsible for carefully reading and following our Medication Agreement. This must be signed before receiving any prescriptions from our practice. Safely store a copy of your signed Agreement. Violations to the Agreement will result in no further prescriptions. (Additional copies of our Medication Agreement are available upon request.) 9. Laws, Rules, & Regulations: All patients are expected to follow all Federal and State Laws, Statutes, Rules, & Regulations. Ignorance of the Laws does not constitute a valid excuse. The use of any illegal substances is prohibited. 10. Adopted CDC guidelines & recommendations: Target dosing levels will be  at or below 60 MME/day. Use of benzodiazepines** is not recommended.  Exceptions: There are only two exceptions to the rule of not   receiving pain medications from other Healthcare Providers. 1. Exception #1 (Emergencies): In the event of an emergency (i.e.: accident requiring emergency care), you are allowed to receive additional pain medication. However, you are responsible for: As soon as you are able, call our office [phone], at any time of the day or night, and leave a message stating your name, the date and nature of the emergency, and the name and dose of the medication prescribed. In the event that your call is answered by a member of our staff, make sure to document and save the date, time, and the name of the person that took your information.  2. Exception #2 (Planned Surgery): In the event that you are scheduled by another doctor or dentist to have any type of surgery or procedure, you are allowed (for a period no longer than 30 days), to receive additional pain medication, for the acute post-op pain. However, in this case, you are responsible for picking up a copy of our "Post-op Pain Management for Surgeons" handout, and giving it to your surgeon or dentist. This document is available at our office, and does not require an appointment to obtain it. Simply go to our office during business hours (Monday-Thursday from 8:00 AM to 4:00 PM) (Friday 8:00 AM to 12:00 Noon) or if you have a scheduled appointment with us, prior to your surgery, and ask for it by name. In addition, you will need to provide us with your name, name of your surgeon, type of surgery, and date of procedure or surgery.  *Opioid medications include: morphine, codeine, oxycodone, oxymorphone, hydrocodone, hydromorphone, meperidine, tramadol, tapentadol, buprenorphine, fentanyl, methadone. **Benzodiazepine medications include: diazepam (Valium), alprazolam (Xanax), clonazepam (Klonopine), lorazepam (Ativan), clorazepate  (Tranxene), chlordiazepoxide (Librium), estazolam (Prosom), oxazepam (Serax), temazepam (Restoril), triazolam (Halcion) (Last updated: 12/09/2017) ____________________________________________________________________________________________    

## 2019-02-20 ENCOUNTER — Ambulatory Visit: Payer: Medicaid Other | Attending: Nurse Practitioner | Admitting: Nurse Practitioner

## 2019-02-20 ENCOUNTER — Other Ambulatory Visit: Payer: Self-pay

## 2019-02-20 ENCOUNTER — Encounter: Payer: Self-pay | Admitting: Nurse Practitioner

## 2019-02-20 DIAGNOSIS — M47816 Spondylosis without myelopathy or radiculopathy, lumbar region: Secondary | ICD-10-CM | POA: Diagnosis not present

## 2019-02-20 DIAGNOSIS — G894 Chronic pain syndrome: Secondary | ICD-10-CM

## 2019-02-20 DIAGNOSIS — M79601 Pain in right arm: Secondary | ICD-10-CM

## 2019-02-20 DIAGNOSIS — G90511 Complex regional pain syndrome I of right upper limb: Secondary | ICD-10-CM

## 2019-02-20 DIAGNOSIS — G8929 Other chronic pain: Secondary | ICD-10-CM

## 2019-02-20 DIAGNOSIS — M79602 Pain in left arm: Secondary | ICD-10-CM

## 2019-02-20 MED ORDER — OXYCODONE HCL 5 MG PO TABS: 5.0000 mg | ORAL_TABLET | Freq: Four times a day (QID) | ORAL | 0 refills | Status: DC | PRN

## 2019-02-20 NOTE — Patient Instructions (Signed)
____________________________________________________________________________________________  Medication Rules  Purpose: To inform patients, and their family members, of our rules and regulations.  Applies to: All patients receiving prescriptions (written or electronic).  Pharmacy of record: Pharmacy where electronic prescriptions will be sent. If written prescriptions are taken to a different pharmacy, please inform the nursing staff. The pharmacy listed in the electronic medical record should be the one where you would like electronic prescriptions to be sent.  Electronic prescriptions: In compliance with the [place] Strengthen Opioid Misuse Prevention (STOP) Act of 2017 (Session Law 2017-74/H243), effective October 12, 2018, all controlled substances must be electronically prescribed. Calling prescriptions to the pharmacy will cease to exist.  Prescription refills: Only during scheduled appointments. Applies to all prescriptions.  NOTE: The following applies primarily to controlled substances (Opioid* Pain Medications).   Patient's responsibilities: 1. Pain Pills: Bring all pain pills to every appointment (except for procedure appointments). 2. Pill Bottles: Bring pills in original pharmacy bottle. Always bring the newest bottle. Bring bottle, even if empty. 3. Medication refills: You are responsible for knowing and keeping track of what medications you take and those you need refilled. The day before your appointment: write a list of all prescriptions that need to be refilled. The day of the appointment: give the list to the admitting nurse. Prescriptions will be written only during appointments. No prescriptions will be written on procedure days. If you forget a medication: it will not be "Called in", "Faxed", or "electronically sent". You will need to get another appointment to get these prescribed. No early refills. Do not call asking to have your prescription filled  early. 4. Prescription Accuracy: You are responsible for carefully inspecting your prescriptions before leaving our office. Have the discharge nurse carefully go over each prescription with you, before taking them home. Make sure that your name is accurately spelled, that your address is correct. Check the name and dose of your medication to make sure it is accurate. Check the number of pills, and the written instructions to make sure they are clear and accurate. Make sure that you are given enough medication to last until your next medication refill appointment. 5. Taking Medication: Take medication as prescribed. When it comes to controlled substances, taking less pills or less frequently than prescribed is permitted and encouraged. Never take more pills than instructed. Never take medication more frequently than prescribed.  6. Inform other Doctors: Always inform, all of your healthcare providers, of all the medications you take. 7. Pain Medication from other Providers: You are not allowed to accept any additional pain medication from any other Doctor or Healthcare provider. There are two exceptions to this rule. (see below) In the event that you require additional pain medication, you are responsible for notifying us, as stated below. 8. Medication Agreement: You are responsible for carefully reading and following our Medication Agreement. This must be signed before receiving any prescriptions from our practice. Safely store a copy of your signed Agreement. Violations to the Agreement will result in no further prescriptions. (Additional copies of our Medication Agreement are available upon request.) 9. Laws, Rules, & Regulations: All patients are expected to follow all Federal and State Laws, Statutes, Rules, & Regulations. Ignorance of the Laws does not constitute a valid excuse. The use of any illegal substances is prohibited. 10. Adopted CDC guidelines & recommendations: Target dosing levels will be  at or below 60 MME/day. Use of benzodiazepines** is not recommended.  Exceptions: There are only two exceptions to the rule of not   receiving pain medications from other Healthcare Providers. 1. Exception #1 (Emergencies): In the event of an emergency (i.e.: accident requiring emergency care), you are allowed to receive additional pain medication. However, you are responsible for: As soon as you are able, call our office [phone], at any time of the day or night, and leave a message stating your name, the date and nature of the emergency, and the name and dose of the medication prescribed. In the event that your call is answered by a member of our staff, make sure to document and save the date, time, and the name of the person that took your information.  2. Exception #2 (Planned Surgery): In the event that you are scheduled by another doctor or dentist to have any type of surgery or procedure, you are allowed (for a period no longer than 30 days), to receive additional pain medication, for the acute post-op pain. However, in this case, you are responsible for picking up a copy of our "Post-op Pain Management for Surgeons" handout, and giving it to your surgeon or dentist. This document is available at our office, and does not require an appointment to obtain it. Simply go to our office during business hours (Monday-Thursday from 8:00 AM to 4:00 PM) (Friday 8:00 AM to 12:00 Noon) or if you have a scheduled appointment with us, prior to your surgery, and ask for it by name. In addition, you will need to provide us with your name, name of your surgeon, type of surgery, and date of procedure or surgery.  *Opioid medications include: morphine, codeine, oxycodone, oxymorphone, hydrocodone, hydromorphone, meperidine, tramadol, tapentadol, buprenorphine, fentanyl, methadone. **Benzodiazepine medications include: diazepam (Valium), alprazolam (Xanax), clonazepam (Klonopine), lorazepam (Ativan), clorazepate  (Tranxene), chlordiazepoxide (Librium), estazolam (Prosom), oxazepam (Serax), temazepam (Restoril), triazolam (Halcion) (Last updated: 12/09/2017) ____________________________________________________________________________________________    

## 2019-02-20 NOTE — Progress Notes (Signed)
Pain Management Encounter Note - Virtual Visit via Telephone Telehealth (real-time audio visits between healthcare provider and patient).  Patient's Phone No. & Preferred Pharmacy:  [phone] (home); There is no such number on file (mobile).; (Preferred) [facility], [place] [address] [place] [address] Phone: [phone] Fax: [phone]   Pre-screening note:  Our staff contacted Elizabeth [name] and offered her an "in person", "face-to-face" appointment versus a telephone encounter. She indicated preferring the telephone encounter, at this time.  Reason for Virtual Visit: COVID-19*  Social distancing based on CDC and AMA recommendations.   I contacted Elizabeth [name] on 02/20/2019 at 12:01 PM by telephone and clearly identified myself as Dionisio David, NP. I verified that I was speaking with the correct person using two identifiers (Name and date of birth: 08-07-1961).  Advanced Informed Consent I sought verbal advanced consent from Elizabeth [name] for telemedicine interactions and virtual visit. I informed Elizabeth [name] of the security and privacy concerns, risks, and limitations associated with performing an evaluation and management service by telephone. I also informed Elizabeth [name] of the availability of "in person" appointments and I informed her of the possibility of a patient responsible charge related to this service. Elizabeth [name] expressed understanding and agreed to proceed.   Historic Elements   Elizabeth [name] is a 58 y.o. year old, female patient evaluated today after her last encounter by our practice on 11/22/2018. Elizabeth [name]  has a past medical history of Anxiety, Arm pain (07/25/2015), CHF (congestive heart failure) ([place]), Congestive heart failure ([place]) (07/25/2015), COPD (chronic obstructive pulmonary disease) ([place]), Depression, Hypertension, Lower extremity pain (07/25/2015), Reflux, RSD (reflex sympathetic dystrophy),  Spinal cord stimulator status, Vitamin B12 deficiency, and Vitamin D deficiency. She also  has a past surgical history that includes Spinal cord stimulator implant. Elizabeth [name] has a current medication list which includes the following prescription(s): albuterol, atorvastatin, carvedilol, chlorpheniramine-hydrocodone, furosemide, ipratropium-albuterol, melatonin, methocarbamol, mometasone-formoterol, omeprazole, oxycodone, polyethylene glycol, and potassium chloride sa. She  reports that she quit smoking about 10 months ago. Her smoking use included cigarettes. She has a 40.00 pack-year smoking history. She has never used smokeless tobacco. She reports that she does not drink alcohol or use drugs. Elizabeth [name] is allergic to lisinopril.   HPI  I last saw her on 11/22/2018. She is being evaluated for medication management. She has 3/10 lower back pain. She also has right arm and hand pain. She is having more back pain because she is caring for 2 young children. She denies any radiating leg pain, numbness, tingling or weakness. She denies any new concerns today. She denies any side effects of her medications.   Pharmacotherapy Assessment  Analgesic:Oxycodone IR 5 mg 1 tablet by mouth every 6 hours (20 mg/day) MME/day:30 mg/day   Monitoring: Pharmacotherapy: No side-effects or adverse reactions reported. [place] PMP: PDMP not reviewed this encounter.       Compliance: No problems identified. Plan: Refer to "POC".  Review of recent tests  DG Chest Portable 1 View CLINICAL DATA:  Mid chest pain, wheezing, tightness  EXAM: PORTABLE CHEST 1 VIEW  COMPARISON:  CT chest 06/21/2018  FINDINGS: There is no focal parenchymal opacity. There is no pleural effusion or pneumothorax. The heart and mediastinal contours are unremarkable.  The osseous structures are unremarkable.  IMPRESSION: No active disease.  Electronically Signed   By: Kathreen Devoid   On: 08/14/2018 15:05   Admission on 08/14/2018,  Discharged on 08/18/2018  Component Date Value Ref Range Status  . B Natriuretic Peptide 08/14/2018 19.0  0.0 - 100.0 pg/mL Final   Performed at [facility], [address] [place], [place] [address]  . WBC 08/14/2018 5.0  4.0 - 10.5 K/uL Final  . RBC 08/14/2018 4.35  3.87 - 5.11 MIL/uL Final  . Hemoglobin 08/14/2018 12.9  12.0 - 15.0 g/dL Final  . HCT 08/14/2018 38.5  36.0 - 46.0 % Final  . MCV 08/14/2018 88.5  80.0 - 100.0 fL Final  . MCH 08/14/2018 29.7  26.0 - 34.0 pg Final  . MCHC 08/14/2018 33.5  30.0 - 36.0 g/dL Final  . RDW 08/14/2018 12.6  11.5 - 15.5 % Final  . Platelets 08/14/2018 211  150 - 400 K/uL Final  . nRBC 08/14/2018 0.0  0.0 - 0.2 % Final  . Neutrophils Relative % 08/14/2018 45  % Final  . Neutro Abs 08/14/2018 2.3  1.7 - 7.7 K/uL Final  . Lymphocytes Relative 08/14/2018 43  % Final  . Lymphs Abs 08/14/2018 2.1  0.7 - 4.0 K/uL Final  . Monocytes Relative 08/14/2018 10  % Final  . Monocytes Absolute 08/14/2018 0.5  0.1 - 1.0 K/uL Final  . Eosinophils Relative 08/14/2018 1  % Final  . Eosinophils Absolute 08/14/2018 0.1  0.0 - 0.5 K/uL Final  . Basophils Relative 08/14/2018 1  % Final  . Basophils Absolute 08/14/2018 0.0  0.0 - 0.1 K/uL Final  . Immature Granulocytes 08/14/2018 0  % Final  . Abs Immature Granulocytes 08/14/2018 0.01  0.00 - 0.07 K/uL Final   Performed at [facility], [address], [place], [place] [address]  . Sodium 08/14/2018 140  135 - 145 mmol/L Final  . Potassium 08/14/2018 3.8  3.5 - 5.1 mmol/L Final  . Chloride 08/14/2018 106  98 - 111 mmol/L Final  . CO2 08/14/2018 23  22 - 32 mmol/L Final  . Glucose, Bld 08/14/2018 124* 70 - 99 mg/dL Final  . BUN 08/14/2018 14  6 - 20 mg/dL Final  . Creatinine, Ser 08/14/2018 0.73  0.44 - 1.00 mg/dL Final  . Calcium 08/14/2018 9.1  8.9 - 10.3 mg/dL Final  . Total Protein 08/14/2018 7.2  6.5 - 8.1 g/dL Final  . Albumin 08/14/2018 4.1  3.5 - 5.0 g/dL Final  . AST 08/14/2018 22  15  - 41 U/L Final  . ALT 08/14/2018 19  0 - 44 U/L Final  . Alkaline Phosphatase 08/14/2018 121  38 - 126 U/L Final  . Total Bilirubin 08/14/2018 0.6  0.3 - 1.2 mg/dL Final  . GFR calc non Af Amer 08/14/2018 >60  >60 mL/min Final  . GFR calc Af Amer 08/14/2018 >60  >60 mL/min Final   Comment: (NOTE) The eGFR has been calculated using the CKD EPI equation. This calculation has not been validated in all clinical situations. eGFR's persistently <60 mL/min signify possible Chronic Kidney Disease.   Georgiann Hahn gap 08/14/2018 11  5 - 15 Final   Performed at [facility], [address] [place], [place] [address]  . Sodium 08/16/2018 143  135 - 145 mmol/L Final  . Potassium 08/16/2018 3.7  3.5 - 5.1 mmol/L Final  . Chloride 08/16/2018 103  98 - 111 mmol/L Final  . CO2 08/16/2018 28  22 - 32 mmol/L Final  . Glucose, Bld 08/16/2018 108* 70 - 99 mg/dL Final  . BUN 08/16/2018 31* 6 - 20 mg/dL Final  . Creatinine,  Ser 08/16/2018 0.90  0.44 - 1.00 mg/dL Final  . Calcium 08/16/2018 8.8* 8.9 - 10.3 mg/dL Final  . GFR calc non Af Amer 08/16/2018 >60  >60 mL/min Final  . GFR calc Af Amer 08/16/2018 >60  >60 mL/min Final   Comment: (NOTE) The eGFR has been calculated using the CKD EPI equation. This calculation has not been validated in all clinical situations. eGFR's persistently <60 mL/min signify possible Chronic Kidney Disease.   Georgiann Hahn gap 08/16/2018 12  5 - 15 Final   Performed at [facility], [address] [place], [place] [address]  . Sodium 08/18/2018 140  135 - 145 mmol/L Final  . Potassium 08/18/2018 4.1  3.5 - 5.1 mmol/L Final  . Chloride 08/18/2018 103  98 - 111 mmol/L Final  . CO2 08/18/2018 29  22 - 32 mmol/L Final  . Glucose, Bld 08/18/2018 125* 70 - 99 mg/dL Final  . BUN 08/18/2018 25* 6 - 20 mg/dL Final  . Creatinine, Ser 08/18/2018 0.76  0.44 - 1.00 mg/dL Final  . Calcium 08/18/2018 8.7* 8.9 - 10.3 mg/dL Final  . GFR calc non Af Amer 08/18/2018 >60  >60  mL/min Final  . GFR calc Af Amer 08/18/2018 >60  >60 mL/min Final   Comment: (NOTE) The eGFR has been calculated using the CKD EPI equation. This calculation has not been validated in all clinical situations. eGFR's persistently <60 mL/min signify possible Chronic Kidney Disease.   Georgiann Hahn gap 08/18/2018 8  5 - 15 Final   Performed at [facility], [address] [place], [place] [address]   Assessment  There were no encounter diagnoses.  Plan of Care  I am having Elizabeth [name] maintain her omeprazole, potassium chloride SA, Melatonin, albuterol, polyethylene glycol, methocarbamol, mometasone-formoterol, chlorpheniramine-HYDROcodone, ipratropium-albuterol, furosemide, carvedilol, atorvastatin, and oxyCODONE.  Pharmacotherapy (Medications Ordered): No orders of the defined types were placed in this encounter.  Orders:  No orders of the defined types were placed in this encounter.  Follow-up plan:   No follow-ups on file.   I discussed the assessment and treatment plan with the patient. The patient was provided an opportunity to ask questions and all were answered. The patient agreed with the plan and demonstrated an understanding of the instructions.  Patient advised to call back or seek an in-person evaluation if the symptoms or condition worsens.  Total duration of non-face-to-face encounter: 12 minutes.  Note by: Dionisio David, NP Date: 02/20/2019; Time: 12:01 PM  Disclaimer:  * Given the special circumstances of the COVID-19 pandemic, the federal government has announced that the Office for Civil Rights (OCR) will exercise its enforcement discretion and will not impose penalties on physicians using telehealth in the event of noncompliance with regulatory requirements under the [place] (HIPAA) in connection with the good faith provision of telehealth during the DHRCB-63 national public health emergency. ([place])

## 2019-03-23 ENCOUNTER — Telehealth: Payer: Self-pay | Admitting: Pain Medicine

## 2019-03-23 NOTE — Telephone Encounter (Signed)
Pt called stating that she needs a PA sent in for oxycodone

## 2019-04-04 ENCOUNTER — Telehealth: Payer: Self-pay

## 2019-04-04 NOTE — Telephone Encounter (Signed)
TELEPHONE CALL NOTE  Elizabeth [name] has been deemed a candidate for a follow-up tele-health visit to limit community exposure during the Covid-19 pandemic. I spoke with the patient via phone to ensure availability of phone/video source, confirm preferred email & phone number, discuss instructions and expectations, and review consent.   I reminded Elizabeth [name] to be prepared with any vital sign and/or heart rhythm information that could potentially be obtained via home monitoring, at the time of her visit.  Finally, I reminded Elizabeth [name] to expect an e-mail containing a link for their video-based visit approximately 15 minutes before her visit, or alternatively, a phone call at the time of her visit if her visit is planned to be a phone encounter.  Did the patient verbally consent to treatment as below? YES  Gaylord Shih, CMA 04/04/2019 1:26 PM  CONSENT FOR TELE-HEALTH VISIT - PLEASE REVIEW  I hereby voluntarily request, consent and authorize [facility] and its employed or contracted physicians, physician assistants, nurse practitioners or other licensed health care professionals (the Practitioner), to provide me with telemedicine health care services (the "Services") as deemed necessary by the treating Practitioner. I acknowledge and consent to receive the Services by the Practitioner via telemedicine. I understand that the telemedicine visit will involve communicating with the Practitioner through telephonic communication technology and the disclosure of certain medical information by electronic transmission. I acknowledge that I have been given the opportunity to request an in-person assessment or other available alternative prior to the telemedicine visit and am voluntarily participating in the telemedicine visit.  I understand that I have the right to withhold or withdraw my consent to the use of telemedicine in the course of my care at any time,  without affecting my right to future care or treatment, and that the Practitioner or I may terminate the telemedicine visit at any time. I understand that I have the right to inspect all information obtained and/or recorded in the course of the telemedicine visit and may receive copies of available information for a reasonable fee.  I understand that some of the potential risks of receiving the Services via telemedicine include:  Marland Kitchen Delay or interruption in medical evaluation due to technological equipment failure or disruption; . Information transmitted may not be sufficient (e.g. poor resolution of images) to allow for appropriate medical decision making by the Practitioner; and/or  . In rare instances, security protocols could fail, causing a breach of personal health information.  Furthermore, I acknowledge that it is my responsibility to provide information about my medical history, conditions and care that is complete and accurate to the best of my ability. I acknowledge that Practitioner's advice, recommendations, and/or decision may be based on factors not within their control, such as incomplete or inaccurate data provided by me or lack of visual representation. I understand that the practice of medicine is not an exact science and that Practitioner makes no warranties or guarantees regarding treatment outcomes. I acknowledge that I will receive a copy of this consent concurrently upon execution via email to the email address I last provided but may also request a printed copy by calling the office of [facility].    I understand that my insurance may be billed for this visit.   I have read or had this consent read to me. . I understand the contents of this consent, which adequately explains the benefits and risks of the Services being provided via telemedicine.  Marland Kitchen  I have been provided ample opportunity to ask questions regarding this consent and the Services and have had my questions  answered to my satisfaction. . I give my informed consent for the services to be provided through the use of telemedicine in my medical care  By participating in this telemedicine visit I agree to the above.

## 2019-04-04 NOTE — Telephone Encounter (Signed)
   TELEPHONE CALL NOTE  This patient has been deemed a candidate for follow-up tele-health visit to limit community exposure during the Covid-19 pandemic. I spoke with the patient via phone to discuss instructions. The patient was advised to review the section on consent for treatment as well. The patient will receive a phone call 2-3 days prior to their E-Visit at which time consent will be verbally confirmed. A Virtual Office Visit appointment type has been scheduled for 04/10/2019 with Darylene Price FNP.  Eason Housman L, CMA 04/04/2019 1:25 PM

## 2019-04-07 DIAGNOSIS — R0603 Acute respiratory distress: Secondary | ICD-10-CM

## 2019-04-07 DIAGNOSIS — J9601 Acute respiratory failure with hypoxia: Secondary | ICD-10-CM

## 2019-04-07 DIAGNOSIS — Z515 Encounter for palliative care: Secondary | ICD-10-CM

## 2019-04-10 ENCOUNTER — Other Ambulatory Visit: Payer: Self-pay

## 2019-04-10 ENCOUNTER — Encounter: Payer: Self-pay | Admitting: Family

## 2019-04-10 ENCOUNTER — Other Ambulatory Visit: Payer: Self-pay | Admitting: Rehabilitative and Restorative Service Providers"

## 2019-04-10 ENCOUNTER — Ambulatory Visit: Payer: Medicaid Other | Attending: Family | Admitting: Family

## 2019-04-10 VITALS — Wt 152.0 lb

## 2019-04-10 DIAGNOSIS — I890 Lymphedema, not elsewhere classified: Secondary | ICD-10-CM

## 2019-04-10 DIAGNOSIS — I5022 Chronic systolic (congestive) heart failure: Secondary | ICD-10-CM

## 2019-04-10 DIAGNOSIS — I509 Heart failure, unspecified: Secondary | ICD-10-CM

## 2019-04-10 DIAGNOSIS — I10 Essential (primary) hypertension: Secondary | ICD-10-CM

## 2019-04-10 DIAGNOSIS — E785 Hyperlipidemia, unspecified: Secondary | ICD-10-CM

## 2019-04-10 DIAGNOSIS — J449 Chronic obstructive pulmonary disease, unspecified: Secondary | ICD-10-CM

## 2019-04-10 NOTE — Patient Instructions (Signed)
Continue weighing daily and call for an overnight weight gain of > 2 pounds or a weekly weight gain of >5 pounds. 

## 2019-04-10 NOTE — Progress Notes (Signed)
Virtual Visit via Telephone Note   Evaluation Performed:  Follow-up visit  This visit type was conducted due to national recommendations for restrictions regarding the COVID-19 Pandemic (e.g. social distancing).  This format is felt to be most appropriate for this patient at this time.  All issues noted in this document were discussed and addressed.  No physical exam was performed (except for noted visual exam findings with Video Visits).  Please refer to the patient's chart (MyChart message for video visits and phone note for telephone visits) for the patient's consent to telehealth for [facility]  Date:  04/10/2019   ID:  Elizabeth [name], DOB 02/28/1961, MRN [id number]  Patient Location: [address] [place] [id number]   Provider location:   [facility] [address] [place], [place] [address]  PCP:  Martie RoundSpencer, Nicole, NP  Cardiologist:  Harold HedgeFath, Kenneth, MD Electrophysiologist:  None   Chief Complaint:  fatigue  History of Present Illness:    Elizabeth [name] is a 58 y.o. female who presents via audio/video conferencing for a telehealth visit today.  Patient verified DOB and address.  The patient does not have symptoms concerning for COVID-19 infection (fever, chills, cough, or new SHORTNESS OF BREATH).   Patient reports moderate fatigue upon minimal exertion. She describes this as chronic in nature although has worsened over the last few weeks as she's been retaining fluid and is currently being worked up by cardiology. Says that she is going to have a stress test in the near future. She has associated difficulty sleeping, shortness of breath, pedal edema, abdominal distention and fluctuating weight along with this. She denies any dizziness, palpitations or chest pain. Has has her furosemide increased to twice daily for the next 3 days.   Prior CV studies:   The following studies were reviewed today:  Echo report from 04/13/18 reviewed and  showed an EF of 45%  Past Medical History:  Diagnosis Date  . Anxiety   . Arm pain 07/25/2015  . CHF (congestive heart failure) (HCC)   . Congestive heart failure (HCC) 07/25/2015  . COPD (chronic obstructive pulmonary disease) (HCC)    never been idagnosed  . Depression   . Hypertension   . Lower extremity pain 07/25/2015  . Reflux   . RSD (reflex sympathetic dystrophy)   . Spinal cord stimulator status   . Vitamin B12 deficiency   . Vitamin D deficiency    Past Surgical History:  Procedure Laterality Date  . SPINAL CORD STIMULATOR IMPLANT     x 2     Current Meds  Medication Sig  . albuterol (PROVENTIL HFA;VENTOLIN HFA) 108 (90 Base) MCG/ACT inhaler Inhale 2 puffs into the lungs every 6 (six) hours as needed for wheezing or shortness of breath.  Marland Kitchen. atorvastatin (LIPITOR) 40 MG tablet Take 1 tablet (40 mg total) by mouth at bedtime.  . carvedilol (COREG) 3.125 MG tablet Take 1 tablet (3.125 mg total) by mouth 2 (two) times daily with a meal.  . furosemide (LASIX) 20 MG tablet Take 1 tablet (20 mg total) by mouth daily. (Patient taking differently: Take 20 mg by mouth 2 (two) times daily. )  . ipratropium-albuterol (DUONEB) 0.5-2.5 (3) MG/3ML SOLN Take 3 mLs by nebulization every 6 (six) hours as needed (wheezing).  . mometasone-formoterol (DULERA) 100-5 MCG/ACT AERO Inhale 2 puffs into the lungs 2 (two) times daily. (Patient taking differently: Inhale 1 puff into the lungs daily. )  . omeprazole (PRILOSEC)  20 MG capsule Take 20 mg by mouth daily.   Marland Kitchen oxyCODONE (OXY IR/ROXICODONE) 5 MG immediate release tablet Take 1 tablet (5 mg total) by mouth every 6 (six) hours as needed for up to 30 days for severe pain.  . polyethylene glycol (MIRALAX / GLYCOLAX) packet Take 17 g by mouth daily. (Patient taking differently: Take 17 g by mouth daily as needed for mild constipation or moderate constipation. )  . potassium chloride SA (K-DUR,KLOR-CON) 20 MEQ tablet Take 20 mEq by mouth daily.   . ramelteon (ROZEREM) 8 MG tablet Take 8 mg by mouth at bedtime.     Allergies:   Lisinopril   Social History   Tobacco Use  . Smoking status: Former Smoker    Packs/day: 1.00    Years: 40.00    Pack years: 40.00    Types: Cigarettes    Quit date: 04/11/2018    Years since quitting: 0.9  . Smokeless tobacco: Never Used  Substance Use Topics  . Alcohol use: No    Alcohol/week: 0.0 standard drinks  . Drug use: No     Family Hx: The patient's family history includes Cancer in her father; Cirrhosis in her mother.  ROS:   Please see the history of present illness.     All other systems reviewed and are negative.   Labs/Other Tests and Data Reviewed:    Recent Labs: 04/13/2018: TSH 0.316 04/18/2018: Magnesium 2.6 08/14/2018: ALT 19; B Natriuretic Peptide 19.0; Hemoglobin 12.9; Platelets 211 08/18/2018: BUN 25; Creatinine, Ser 0.76; Potassium 4.1; Sodium 140   Recent Lipid Panel No results found for: CHOL, TRIG, HDL, CHOLHDL, LDLCALC, LDLDIRECT  Wt Readings from Last 3 Encounters:  04/10/19 152 lb (68.9 kg)  11/22/18 140 lb (63.5 kg)  08/24/18 141 lb (64 kg)     Exam:    Vital Signs:  Wt 152 lb (68.9 kg) Comment: self-reported  BMI 32.89 kg/m    Well nourished, well developed female in no  acute distress.   ASSESSMENT & PLAN:    1. Chronic heart failure with reduced ejection fraction- - NYHA class III - mildly fluid overloaded today per patient's description of symptoms - weighing daily; reminded to call for an overnight weight gain of >2 pounds or a weekly weight gain of >5 pounds - home weight has been fluctuating between 147-152 pounds; currently taking increased dose of diuretic per cardiology so will not adjust it any further at this time - saw cardiology Minette Brine) 04/07/2019 - pending stress test - says that she's not adding salt to her food - EF mildly depressed but >40% so would not qualify for entresto plus 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 - BNP  08/14/18 was 19.0  2: HTN- - not checking her BP at home - saw PCP at [facility] 03/31/2019 - BMP 08/18/18 reviewed and showed sodium 140, potassium 4.1, creatinine 0.76 and GFR >60  3: COPD- - using inhalers and nebulizer   4: Lymphedema- - stage 2 - edema present upon awakening in the morning - doesn't have support socks and she was encouraged to get some and put them on in the mornings with removal at bedtime - elevate legs when sitting for long periods of time - consider compression boots if edema persists  COVID-19 Education: The signs and symptoms of COVID-19 were discussed with the patient and how to seek care for testing (follow up with PCP or arrange E-visit).  The importance of social distancing was discussed today.  Patient Risk:  After full review of this patients clinical status, I feel that they are at least moderate risk at this time.  Time:   Today, I have spent 11 minutes with the patient with telehealth technology discussing medications, diet and symptoms to report.     Medication Adjustments/Labs and Tests Ordered: Current medicines are reviewed at length with the patient today.  Concerns regarding medicines are outlined above.   Tests Ordered: No orders of the defined types were placed in this encounter.  Medication Changes: No orders of the defined types were placed in this encounter.   Disposition:  Follow-up in 3 months or sooner for any questions/problems before then.   Signed, Delma Freezeina A Yahya Boldman, FNP  04/10/2019 8:49 AM    [facility]

## 2019-05-02 ENCOUNTER — Other Ambulatory Visit: Payer: Self-pay

## 2019-05-02 ENCOUNTER — Ambulatory Visit
Admission: RE | Admit: 2019-05-02 | Discharge: 2019-05-02 | Disposition: A | Discharge status: Home / Self Care | Payer: Medicaid Other | Source: Ambulatory Visit | Attending: Rehabilitative and Restorative Service Providers" | Admitting: Rehabilitative and Restorative Service Providers"

## 2019-05-02 DIAGNOSIS — I509 Heart failure, unspecified: Secondary | ICD-10-CM | POA: Insufficient documentation

## 2019-05-02 DIAGNOSIS — I10 Essential (primary) hypertension: Secondary | ICD-10-CM | POA: Diagnosis present

## 2019-05-02 DIAGNOSIS — E785 Hyperlipidemia, unspecified: Secondary | ICD-10-CM | POA: Insufficient documentation

## 2019-05-02 MED ORDER — REGADENOSON 0.4 MG/5ML IV SOLN
0.4000 mg | Freq: Once | INTRAVENOUS | Status: AC
  Administered 2019-05-02: 12:00:00 0.4 mg via INTRAVENOUS

## 2019-05-02 MED ORDER — TECHNETIUM TC 99M TETROFOSMIN IV KIT
29.2610 | PACK | Freq: Once | INTRAVENOUS | Status: AC | PRN
  Administered 2019-05-02: 13:00:00 29.261 via INTRAVENOUS

## 2019-05-02 MED ORDER — TECHNETIUM TC 99M TETROFOSMIN IV KIT
10.6600 | PACK | Freq: Once | INTRAVENOUS | Status: AC | PRN
  Administered 2019-05-02: 10:00:00 10.66 via INTRAVENOUS

## 2019-05-15 ENCOUNTER — Encounter: Payer: Self-pay | Admitting: Pain Medicine

## 2019-05-16 ENCOUNTER — Ambulatory Visit: Payer: Medicaid Other | Attending: Pain Medicine | Admitting: Pain Medicine

## 2019-05-16 ENCOUNTER — Other Ambulatory Visit: Payer: Self-pay

## 2019-05-16 DIAGNOSIS — Z969 Presence of functional implant, unspecified: Secondary | ICD-10-CM

## 2019-05-16 DIAGNOSIS — M79605 Pain in left leg: Secondary | ICD-10-CM

## 2019-05-16 DIAGNOSIS — G894 Chronic pain syndrome: Secondary | ICD-10-CM | POA: Diagnosis not present

## 2019-05-16 DIAGNOSIS — Z79899 Other long term (current) drug therapy: Secondary | ICD-10-CM

## 2019-05-16 DIAGNOSIS — M79604 Pain in right leg: Secondary | ICD-10-CM

## 2019-05-16 DIAGNOSIS — E559 Vitamin D deficiency, unspecified: Secondary | ICD-10-CM

## 2019-05-16 DIAGNOSIS — G8929 Other chronic pain: Secondary | ICD-10-CM

## 2019-05-16 DIAGNOSIS — M79601 Pain in right arm: Secondary | ICD-10-CM | POA: Diagnosis not present

## 2019-05-16 DIAGNOSIS — E538 Deficiency of other specified B group vitamins: Secondary | ICD-10-CM

## 2019-05-16 DIAGNOSIS — M899 Disorder of bone, unspecified: Secondary | ICD-10-CM

## 2019-05-16 DIAGNOSIS — Z789 Other specified health status: Secondary | ICD-10-CM | POA: Insufficient documentation

## 2019-05-16 DIAGNOSIS — R700 Elevated erythrocyte sedimentation rate: Secondary | ICD-10-CM

## 2019-05-16 DIAGNOSIS — G90513 Complex regional pain syndrome I of upper limb, bilateral: Secondary | ICD-10-CM | POA: Insufficient documentation

## 2019-05-16 DIAGNOSIS — M79602 Pain in left arm: Secondary | ICD-10-CM

## 2019-05-16 MED ORDER — OXYCODONE HCL 5 MG PO TABS: 5.0000 mg | ORAL_TABLET | Freq: Four times a day (QID) | ORAL | 0 refills | Status: DC | PRN

## 2019-05-16 NOTE — Patient Instructions (Signed)
____________________________________________________________________________________________  Medication Rules  Purpose: To inform patients, and their family members, of our rules and regulations.  Applies to: All patients receiving prescriptions (written or electronic).  Pharmacy of record: Pharmacy where electronic prescriptions will be sent. If written prescriptions are taken to a different pharmacy, please inform the nursing staff. The pharmacy listed in the electronic medical record should be the one where you would like electronic prescriptions to be sent.  Electronic prescriptions: In compliance with the [place] Strengthen Opioid Misuse Prevention (STOP) Act of 2017 (Session Law 2017-74/H243), effective October 12, 2018, all controlled substances must be electronically prescribed. Calling prescriptions to the pharmacy will cease to exist.  Prescription refills: Only during scheduled appointments. Applies to all prescriptions.  NOTE: The following applies primarily to controlled substances (Opioid* Pain Medications).   Patient's responsibilities: 1. Pain Pills: Bring all pain pills to every appointment (except for procedure appointments). 2. Pill Bottles: Bring pills in original pharmacy bottle. Always bring the newest bottle. Bring bottle, even if empty. 3. Medication refills: You are responsible for knowing and keeping track of what medications you take and those you need refilled. The day before your appointment: write a list of all prescriptions that need to be refilled. The day of the appointment: give the list to the admitting nurse. Prescriptions will be written only during appointments. No prescriptions will be written on procedure days. If you forget a medication: it will not be "Called in", "Faxed", or "electronically sent". You will need to get another appointment to get these prescribed. No early refills. Do not call asking to have your prescription filled  early. 4. Prescription Accuracy: You are responsible for carefully inspecting your prescriptions before leaving our office. Have the discharge nurse carefully go over each prescription with you, before taking them home. Make sure that your name is accurately spelled, that your address is correct. Check the name and dose of your medication to make sure it is accurate. Check the number of pills, and the written instructions to make sure they are clear and accurate. Make sure that you are given enough medication to last until your next medication refill appointment. 5. Taking Medication: Take medication as prescribed. When it comes to controlled substances, taking less pills or less frequently than prescribed is permitted and encouraged. Never take more pills than instructed. Never take medication more frequently than prescribed.  6. Inform other Doctors: Always inform, all of your healthcare providers, of all the medications you take. 7. Pain Medication from other Providers: You are not allowed to accept any additional pain medication from any other Doctor or Healthcare provider. There are two exceptions to this rule. (see below) In the event that you require additional pain medication, you are responsible for notifying us, as stated below. 8. Medication Agreement: You are responsible for carefully reading and following our Medication Agreement. This must be signed before receiving any prescriptions from our practice. Safely store a copy of your signed Agreement. Violations to the Agreement will result in no further prescriptions. (Additional copies of our Medication Agreement are available upon request.) 9. Laws, Rules, & Regulations: All patients are expected to follow all Federal and State Laws, Statutes, Rules, & Regulations. Ignorance of the Laws does not constitute a valid excuse. The use of any illegal substances is prohibited. 10. Adopted CDC guidelines & recommendations: Target dosing levels will be  at or below 60 MME/day. Use of benzodiazepines** is not recommended.  Exceptions: There are only two exceptions to the rule of not   receiving pain medications from other Healthcare Providers. 1. Exception #1 (Emergencies): In the event of an emergency (i.e.: accident requiring emergency care), you are allowed to receive additional pain medication. However, you are responsible for: As soon as you are able, call our office [phone], at any time of the day or night, and leave a message stating your name, the date and nature of the emergency, and the name and dose of the medication prescribed. In the event that your call is answered by a member of our staff, make sure to document and save the date, time, and the name of the person that took your information.  2. Exception #2 (Planned Surgery): In the event that you are scheduled by another doctor or dentist to have any type of surgery or procedure, you are allowed (for a period no longer than 30 days), to receive additional pain medication, for the acute post-op pain. However, in this case, you are responsible for picking up a copy of our "Post-op Pain Management for Surgeons" handout, and giving it to your surgeon or dentist. This document is available at our office, and does not require an appointment to obtain it. Simply go to our office during business hours (Monday-Thursday from 8:00 AM to 4:00 PM) (Friday 8:00 AM to 12:00 Noon) or if you have a scheduled appointment with us, prior to your surgery, and ask for it by name. In addition, you will need to provide us with your name, name of your surgeon, type of surgery, and date of procedure or surgery.  *Opioid medications include: morphine, codeine, oxycodone, oxymorphone, hydrocodone, hydromorphone, meperidine, tramadol, tapentadol, buprenorphine, fentanyl, methadone. **Benzodiazepine medications include: diazepam (Valium), alprazolam (Xanax), clonazepam (Klonopine), lorazepam (Ativan), clorazepate  (Tranxene), chlordiazepoxide (Librium), estazolam (Prosom), oxazepam (Serax), temazepam (Restoril), triazolam (Halcion) (Last updated: 12/09/2017) ____________________________________________________________________________________________   ____________________________________________________________________________________________  Medication Recommendations and Reminders  Applies to: All patients receiving prescriptions (written and/or electronic).  Medication Rules & Regulations: These rules and regulations exist for your safety and that of others. They are not flexible and neither are we. Dismissing or ignoring them will be considered "non-compliance" with medication therapy, resulting in complete and irreversible termination of such therapy. (See document titled "Medication Rules" for more details.) In all conscience, because of safety reasons, we cannot continue providing a therapy where the patient does not follow instructions.  Pharmacy of record:   Definition: This is the pharmacy where your electronic prescriptions will be sent.   We do not endorse any particular pharmacy.  You are not restricted in your choice of pharmacy.  The pharmacy listed in the electronic medical record should be the one where you want electronic prescriptions to be sent.  If you choose to change pharmacy, simply notify our nursing staff of your choice of new pharmacy.  Recommendations:  Keep all of your pain medications in a safe place, under lock and key, even if you live alone.   After you fill your prescription, take 1 week's worth of pills and put them away in a safe place. You should keep a separate, properly labeled bottle for this purpose. The remainder should be kept in the original bottle. Use this as your primary supply, until it runs out. Once it's gone, then you know that you have 1 week's worth of medicine, and it is time to come in for a prescription refill. If you do this correctly, it  is unlikely that you will ever run out of medicine.  To make sure that the above recommendation works,   it is very important that you make sure your medication refill appointments are scheduled at least 1 week before you run out of medicine. To do this in an effective manner, make sure that you do not leave the office without scheduling your next medication management appointment. Always ask the nursing staff to show you in your prescription , when your medication will be running out. Then arrange for the receptionist to get you a return appointment, at least 7 days before you run out of medicine. Do not wait until you have 1 or 2 pills left, to come in. This is very poor planning and does not take into consideration that we may need to cancel appointments due to bad weather, sickness, or emergencies affecting our staff.  "Partial Fill": If for any reason your pharmacy does not have enough pills/tablets to completely fill or refill your prescription, do not allow for a "partial fill". You will need a separate prescription to fill the remaining amount, which we will not provide. If the reason for the partial fill is your insurance, you will need to talk to the pharmacist about payment alternatives for the remaining tablets, but again, do not accept a partial fill.  Prescription refills and/or changes in medication(s):   Prescription refills, and/or changes in dose or medication, will be conducted only during scheduled medication management appointments. (Applies to both, written and electronic prescriptions.)  No refills on procedure days. No medication will be changed or started on procedure days. No changes, adjustments, and/or refills will be conducted on a procedure day. Doing so will interfere with the diagnostic portion of the procedure.  No phone refills. No medications will be "called into the pharmacy".  No Fax refills.  No weekend refills.  No Holliday refills.  No after hours  refills.  Remember:  Business hours are:  Monday to Thursday 8:00 AM to 4:00 PM Provider's Schedule: Crystal King, NP - Appointments are:  Medication management: Monday to Thursday 8:00 AM to 4:00 PM Jazmeen Axtell, MD - Appointments are:  Medication management: Monday and Wednesday 8:00 AM to 4:00 PM Procedure day: Tuesday and Thursday 7:30 AM to 4:00 PM Bilal Lateef, MD - Appointments are:  Medication management: Tuesday and Thursday 8:00 AM to 4:00 PM Procedure day: Monday and Wednesday 7:30 AM to 4:00 PM (Last update: 12/09/2017) ____________________________________________________________________________________________   ____________________________________________________________________________________________  CANNABIDIOL (AKA: CBD Oil or Pills)  Applies to: All patients receiving prescriptions of controlled substances (written and/or electronic).  General Information: Cannabidiol (CBD) was discovered in 1940. It is one of some 113 identified cannabinoids in cannabis (Marijuana) plants, accounting for up to 40% of the plant's extract. As of 2018, preliminary clinical research on cannabidiol included studies of anxiety, cognition, movement disorders, and pain.  Cannabidiol is consummed in multiple ways, including inhalation of cannabis smoke or vapor, as an aerosol spray into the cheek, and by mouth. It may be supplied as CBD oil containing CBD as the active ingredient (no added tetrahydrocannabinol (THC) or terpenes), a full-plant CBD-dominant hemp extract oil, capsules, dried cannabis, or as a liquid solution. CBD is thought not have the same psychoactivity as THC, and may affect the actions of THC. Studies suggest that CBD may interact with different biological targets, including cannabinoid receptors and other neurotransmitter receptors. As of 2018 the mechanism of action for its biological effects has not been determined.  In the [place], cannabidiol has a limited  approval by the Food and Drug Administration (FDA) for treatment of only two types   of epilepsy disorders. The side effects of long-term use of the drug include somnolence, decreased appetite, diarrhea, fatigue, malaise, weakness, sleeping problems, and others.  CBD remains a Schedule I drug prohibited for any use.  Legality: Some manufacturers ship CBD products nationally, an illegal action which the FDA has not enforced in 2018, with CBD remaining the subject of an FDA investigational new drug evaluation, and is not considered legal as a dietary supplement or food ingredient as of December 2018. Federal illegality has made it difficult historically to conduct research on CBD. CBD is openly sold in head shops and health food stores in some states where such sales have not been explicitly legalized.  Warning: Because it is not FDA approved for general use or treatment of pain, it is not required to undergo the same manufacturing controls as prescription drugs.  This means that the available cannabidiol (CBD) may be contaminated with THC.  If this is the case, it will trigger a positive urine drug screen (UDS) test for cannabinoids (Marijuana).  Because a positive UDS for illicit substances is a violation of our medication agreement, your opioid analgesics (pain medicine) may be permanently discontinued. (Last update: 12/30/2017) ____________________________________________________________________________________________    

## 2019-05-16 NOTE — Progress Notes (Signed)
Pain Management Virtual Encounter Note - Virtual Visit via Telephone Telehealth (real-time audio visits between healthcare provider and patient).   Patient's Phone No. & Preferred Pharmacy:  [phone] (home); [phone] (mobile); (Preferred) [phone] No e-mail address on record  [facility] [place] [place] - [address] [place] [id number] Phone: [phone] Fax: [phone]    Pre-screening note:  Our staff contacted Elizabeth [name] and offered her an "in person", "face-to-face" appointment versus a telephone encounter. She indicated preferring the telephone encounter, at this time.   Reason for Virtual Visit: COVID-19*  Social distancing based on CDC and AMA recommendations.   I contacted Elizabeth BellmanRita Lannette [name] on 05/16/2019 via telephone.      I clearly identified myself as Oswaldo DoneFrancisco A Balinda Heacock, MD. I verified that I was speaking with the correct person using two identifiers (Name: Elizabeth [name], and date of birth: [birth year]).  Advanced Informed Consent I sought verbal advanced consent from Elizabeth [name] for virtual visit interactions. I informed [name] of possible security and privacy concerns, risks, and limitations associated with providing "not-in-person" medical evaluation and management services. I also informed [name] of the availability of "in-person" appointments. Finally, I informed her that there would be a charge for the virtual visit and that she could be  personally, fully or partially, financially responsible for it. Ms. Elizabeth ScarletHudgins expressed understanding and agreed to proceed.   Historic Elements   [name] is a 58 y.o. year old, female patient evaluated today after her last encounter by our practice on 03/23/2019. Ms. Elizabeth ScarletHudgins  has a past medical history of Anxiety, Arm pain (07/25/2015), CHF (congestive heart failure) (HCC), Congestive heart failure (HCC) (07/25/2015), COPD (chronic obstructive pulmonary disease)  (HCC), Depression, Hypertension, Lower extremity pain (07/25/2015), Reflux, RSD (reflex sympathetic dystrophy), Spinal cord stimulator status, Vitamin B12 deficiency, and Vitamin D deficiency. She also  has a past surgical history that includes Spinal cord stimulator implant. Ms. Elizabeth ScarletHudgins has a current medication list which includes the following prescription(s): albuterol, atorvastatin, carvedilol, furosemide, ipratropium-albuterol, omeprazole, oxycodone, oxycodone, oxycodone, polyethylene glycol, potassium chloride sa, ramelteon, and mometasone-formoterol. She  reports that she quit smoking about 13 months ago. Her smoking use included cigarettes. She has a 40.00 pack-year smoking history. She has never used smokeless tobacco. She reports that she does not drink alcohol or use drugs. Ms. Elizabeth ScarletHudgins is allergic to lisinopril.   HPI  Today, she is being contacted for medication management.  Pharmacotherapy Assessment  Analgesic: Oxycodone IR 5 mg, 1 tab PO q 6 hrs (20 mg/day of oxycodone) MME/day:30 mg/day.   Monitoring: Pharmacotherapy: No side-effects or adverse reactions reported. [place] PMP: PDMP reviewed during this encounter.       Compliance: No problems identified. Effectiveness: Clinically acceptable. Plan: Refer to "POC".  UDS:  Summary  Date Value Ref Range Status  05/16/2018 FINAL  Final    Comment:    ==================================================================== TOXASSURE SELECT 13 (MW) ==================================================================== Specimen Alert Note:  Urinary creatinine is low; ability to detect some drugs may be compromised.  Interpret results with caution. ==================================================================== Test                             Result       Flag       Units Drug Present and Declared for Prescription Verification   Oxycodone  2816         EXPECTED   ng/mg creat   Oxymorphone                    789           EXPECTED   ng/mg creat   Noroxycodone                   2511         EXPECTED   ng/mg creat   Noroxymorphone                 311          EXPECTED   ng/mg creat    Sources of oxycodone are scheduled prescription medications.    Oxymorphone, noroxycodone, and noroxymorphone are expected    metabolites of oxycodone. Oxymorphone is also available as a    scheduled prescription medication. ==================================================================== Test                      Result    Flag   Units      Ref Range   Creatinine              19        L      mg/dL      >=20 ==================================================================== Declared Medications:  The flagging and interpretation on this report are based on the  following declared medications.  Unexpected results may arise from  inaccuracies in the declared medications.  **Note: The testing scope of this panel includes these medications:  Oxycodone  **Note: The testing scope of this panel does not include following  reported medications:  Albuterol  Albuterol (Duoneb)  Atorvastatin (Lipitor)  Carvedilol (Coreg)  Furosemide (Lasix)  Ipratropium (Duoneb)  Melatonin  Omeprazole (Prilosec)  Polyethylene Glycol  Potassium ==================================================================== For clinical consultation, please call [phone]. ====================================================================    Laboratory Chemistry Profile (12 mo)  Renal: 08/18/2018: BUN 25; Creatinine, Ser 0.76; GFR calc Af Amer >60; GFR calc non Af Amer >60  Hepatic: 08/14/2018: Albumin 4.1; ALT 19; AST 22 Other: No results found for requested labs within last 8760 hours. Note: Above Lab results reviewed.  Imaging  Last 90 days:  No results found. Last Hospital Admission:  No results found. Assessment  The primary encounter diagnosis was Chronic pain syndrome. Diagnoses of Chronic lower extremity pain (Bilateral),  Chronic pain of both upper extremities, Presence of spinal cord stimulator, Pharmacologic therapy, Disorder of skeletal system, Problems influencing health status, Elevated sedimentation rate, Vitamin B12 deficiency, and Vitamin D deficiency were also pertinent to this visit.  Plan of Care  I have discontinued Cait L. Subia's oxyCODONE, oxyCODONE, oxyCODONE, and oxyCODONE. I have also changed her oxyCODONE. Additionally, I am having her start on oxyCODONE and oxyCODONE. Lastly, I am having her maintain her omeprazole, potassium chloride SA, albuterol, polyethylene glycol, mometasone-formoterol, ipratropium-albuterol, furosemide, carvedilol, atorvastatin, and ramelteon.  Pharmacotherapy (Medications Ordered): Meds ordered this encounter  Medications  . oxyCODONE (OXY IR/ROXICODONE) 5 MG immediate release tablet    Sig: Take 1 tablet (5 mg total) by mouth every 6 (six) hours as needed for severe pain. Must last 30 days    Dispense:  120 tablet    Refill:  0    Chronic Pain: STOP Act (Not applicable) Fill 1 day early if closed on refill date. Do not fill until: 06/02/2019. To last until: 07/02/2019. Avoid benzodiazepines within 8 hours of opioids  . oxyCODONE (  OXY IR/ROXICODONE) 5 MG immediate release tablet    Sig: Take 1 tablet (5 mg total) by mouth every 6 (six) hours as needed for severe pain. Must last 30 days    Dispense:  120 tablet    Refill:  0    Chronic Pain: STOP Act (Not applicable) Fill 1 day early if closed on refill date. Do not fill until: 07/02/2019. To last until: 08/01/2019. Avoid benzodiazepines within 8 hours of opioids  . oxyCODONE (OXY IR/ROXICODONE) 5 MG immediate release tablet    Sig: Take 1 tablet (5 mg total) by mouth every 6 (six) hours as needed for severe pain. Must last 30 days    Dispense:  120 tablet    Refill:  0    Chronic Pain: STOP Act (Not applicable) Fill 1 day early if closed on refill date. Do not fill until: 08/01/2019. To last until: 08/31/2019. Avoid  benzodiazepines within 8 hours of opioids   Orders:  Orders Placed This Encounter  Procedures  . ToxASSURE Select 13 (MW), Urine    Volume: 30 ml(s). Minimum 3 ml of urine is needed. Document temperature of fresh sample. Indications: Long term (current) use of opiate analgesic (Z79.891)  . Comp. Metabolic Panel (12)    With GFR. Indications: Chronic Pain Syndrome (G89.4) & Pharmacotherapy (Z61.096(Z79.899)    Order Specific Question:   Has the patient fasted?    Answer:   No    Order Specific Question:   CC Results    Answer:   PCP-NURSE [[id number]]  . Magnesium    Indication: Pharmacologic therapy (E45.409(Z79.899)    Order Specific Question:   CC Results    Answer:   PCP-NURSE [[id number]][[id number]]  . Vitamin B12    Indication: Pharmacologic therapy (N82.956(Z79.899).    Order Specific Question:   CC Results    Answer:   PCP-NURSE [[id number]]  . Sedimentation rate    Indication: Disorder of skeletal system (M89.9)    Order Specific Question:   CC Results    Answer:   PCP-NURSE [[id number]][[id number]]  . 25-Hydroxyvitamin D Lcms D2+D3    Indication: Disorder of skeletal system (M89.9).    Order Specific Question:   CC Results    Answer:   PCP-NURSE [[id number]]  . C-reactive protein    Indication: Problems influencing health status (Z78.9)    Order Specific Question:   CC Results    Answer:   PCP-NURSE [[id number]][[id number]]   Follow-up plan:   Return in about 3 months (around 08/30/2019) for (VV), E/M (MM).      Interventional therapies:  Considering:   Diagnostic bilateral lumbar facet block  Possible bilateral lumbar facet RFA  Possible bilateral lumbar sympathetic RFA    Palliative PRN treatment(s):   Palliative right vs left SGBs  Palliative  right vs left LSBs     Recent Visits Date Type Provider Dept  02/20/19 Office Visit Barbette MerinoKing, Crystal M, NP [facility]  Showing recent visits within past 90 days and meeting all other requirements   Today's Visits Date Type Provider Dept  05/16/19 Office Visit Delano MetzNaveira, Jaqwon Manfred, MD  [facility]  Showing today's visits and meeting all other requirements   Future Appointments No visits were found meeting these conditions.  Showing future appointments within next 90 days and meeting all other requirements   I discussed the assessment and treatment plan with the patient. The patient was provided an opportunity to ask questions and all were answered. The patient agreed with the plan and demonstrated an understanding  of the instructions.  Patient advised to call back or seek an in-person evaluation if the symptoms or condition worsens.  Total duration of non-face-to-face encounter: 13 minutes.  Note by: Oswaldo DoneFrancisco A Joedy Eickhoff, MD Date: 05/16/2019; Time: 2:07 PM  Note: This dictation was prepared with Dragon dictation. Any transcriptional errors that may result from this process are unintentional.  Disclaimer:  * Given the special circumstances of the COVID-19 pandemic, the federal government has announced that the Office for Civil Rights (OCR) will exercise its enforcement discretion and will not impose penalties on physicians using telehealth in the event of noncompliance with regulatory requirements under the [employer] Insurance Portability and Accountability Act (HIPAA) in connection with the good faith provision of telehealth during the COVID-19 national public health emergency. (AMA)

## 2019-05-22 ENCOUNTER — Ambulatory Visit: Payer: Medicaid Other | Admitting: Pain Medicine

## 2019-06-15 LAB — NM MYOCAR MULTI W/SPECT W/WALL MOTION / EF
Estimated workload: 1 METS
Exercise duration (min): 1 min
Exercise duration (sec): 0 s
LV dias vol: 59 mL (ref 46–106)
LV sys vol: 20 mL
MPHR: 162 {beats}/min
Peak HR: 93 {beats}/min
Percent HR: 57 %
Rest HR: 57 {beats}/min
SDS: 0
SRS: 0
SSS: 1
TID: 1.02

## 2019-06-23 ENCOUNTER — Telehealth: Payer: Self-pay | Admitting: *Deleted

## 2019-06-23 DIAGNOSIS — Z122 Encounter for screening for malignant neoplasm of respiratory organs: Secondary | ICD-10-CM

## 2019-06-23 DIAGNOSIS — Z87891 Personal history of nicotine dependence: Secondary | ICD-10-CM

## 2019-06-23 NOTE — Telephone Encounter (Signed)
Patient has been notified that annual lung cancer screening low dose CT scan is due currently or will be in near future. Confirmed that patient is within the age range of 55-77, and asymptomatic, (no signs or symptoms of lung cancer). Patient denies illness that would prevent curative treatment for lung cancer if found. Verified smoking history, (former, quit 06/21/18, 40 pack year). The shared decision making visit was done 06/21/18. Patient is agreeable for CT scan being scheduled.

## 2019-07-05 ENCOUNTER — Ambulatory Visit: Admission: RE | Admit: 2019-07-05 | Payer: Medicaid Other | Source: Ambulatory Visit

## 2019-07-09 IMAGING — DX DG FOOT COMPLETE 3+V*R*
3 series · 3 of 3 positions shown · non-contrast
Comparison: None.

CLINICAL DATA: Injury with acute pain

EXAM:
RIGHT FOOT COMPLETE - 3+ VIEW

[foot ap]
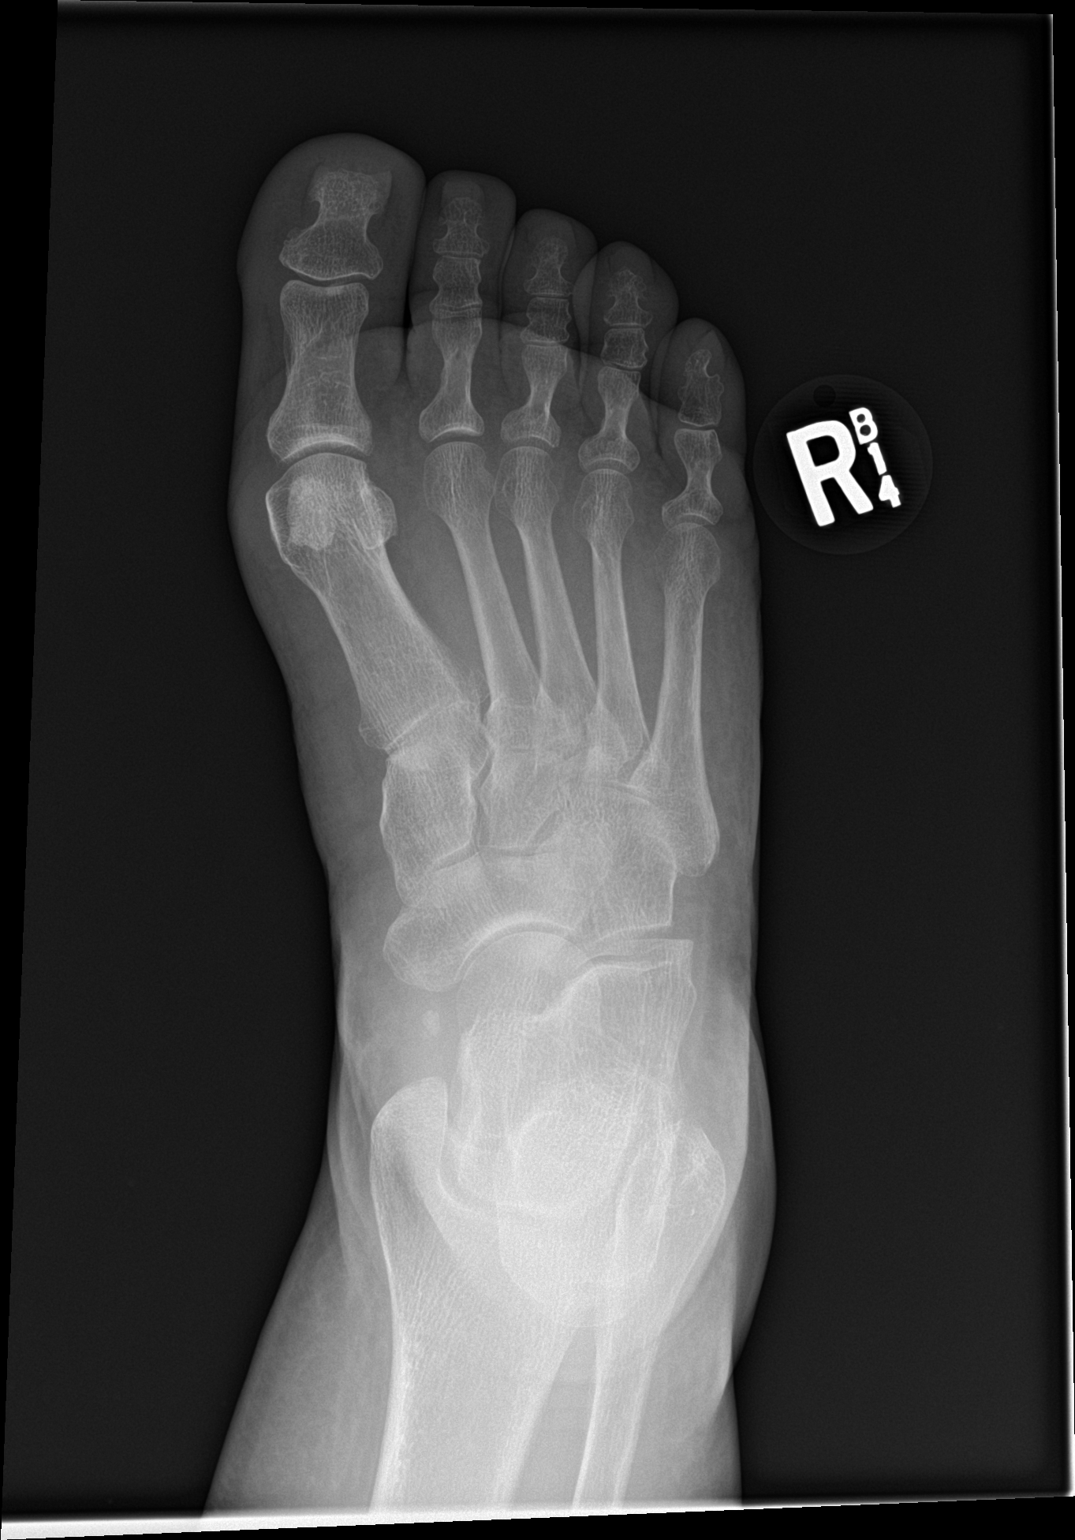

[foot obl]
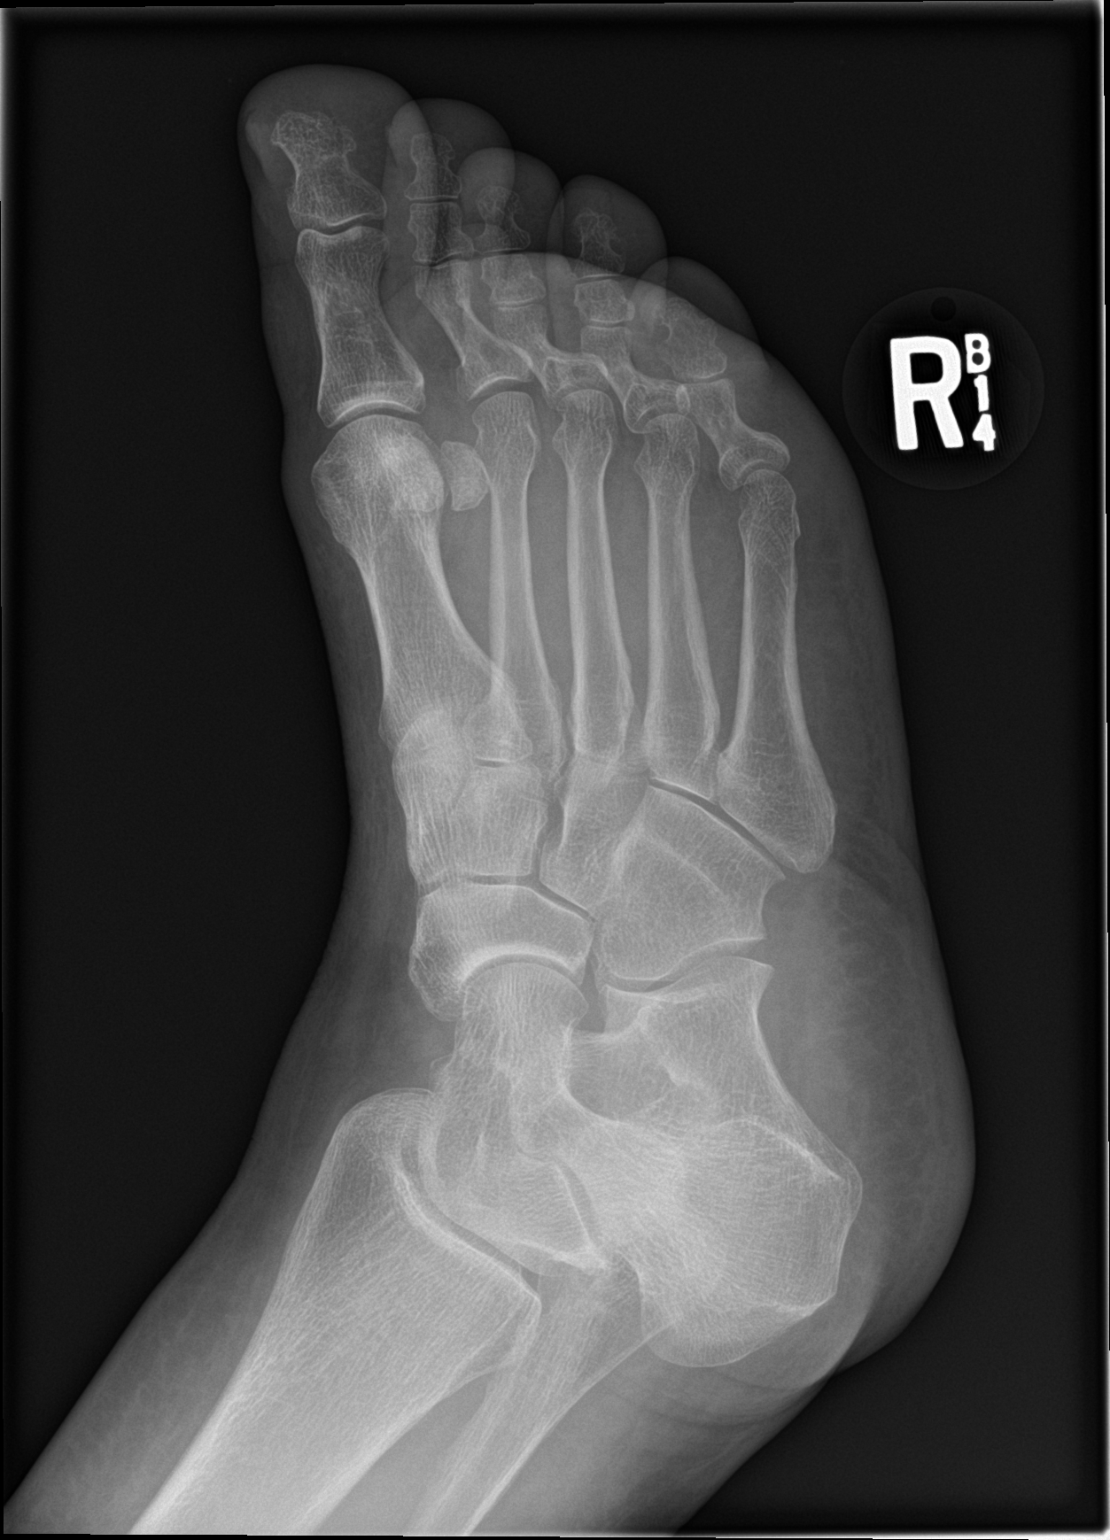

[foot lat]
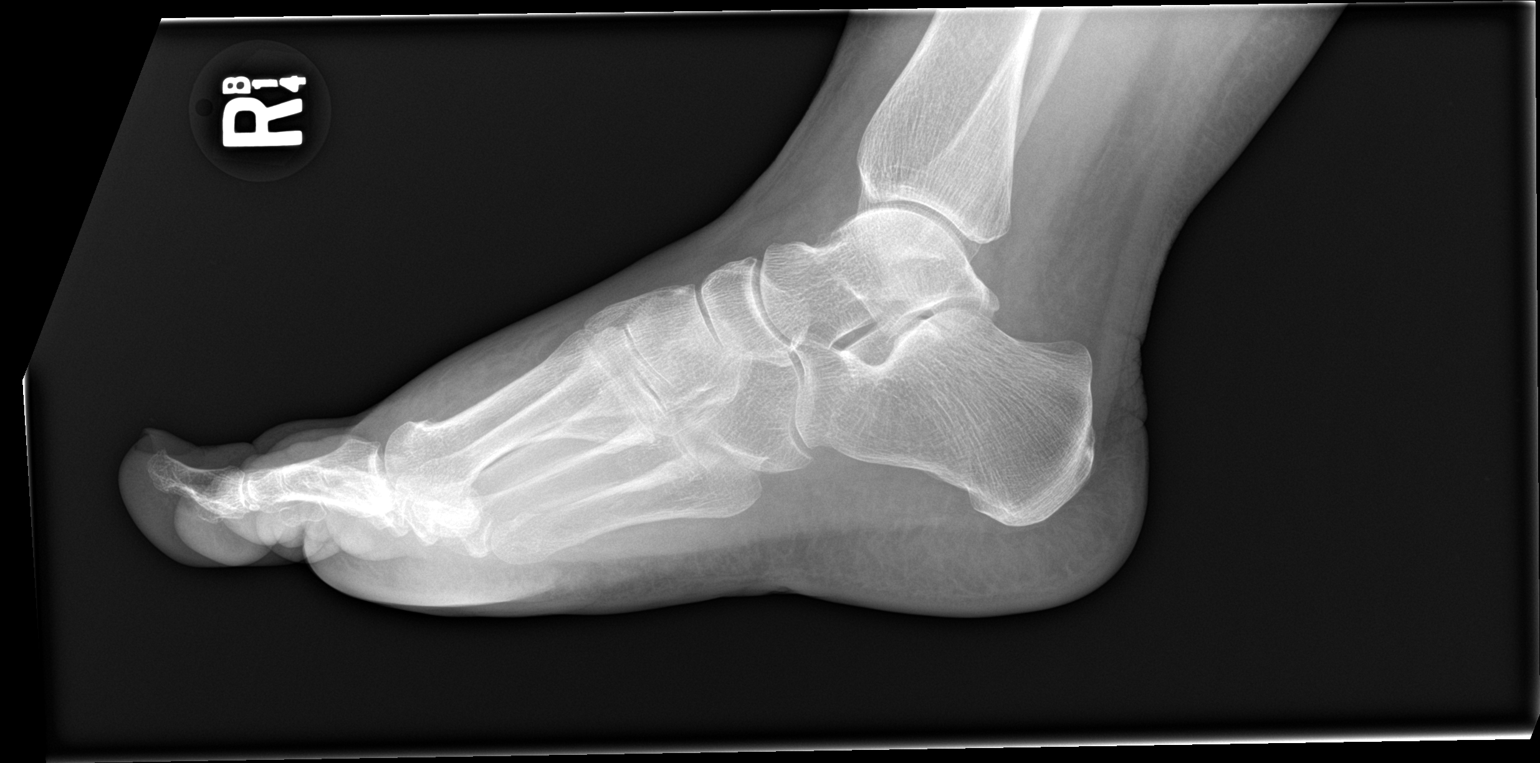

[3 of 3 positions shown; findings below may reference images not displayed]

FINDINGS: No acute displaced fracture or malalignment. No radiopaque foreign
body in the soft tissues
IMPRESSION: No acute osseous abnormality

## 2019-07-10 ENCOUNTER — Ambulatory Visit: Payer: Medicaid Other | Admitting: Family

## 2019-08-16 ENCOUNTER — Telehealth: Payer: Self-pay

## 2019-08-16 NOTE — Telephone Encounter (Signed)
Patient states someone called her from here,  But I couldn't find anyone who did.

## 2019-08-17 ENCOUNTER — Encounter: Payer: Self-pay | Admitting: Pain Medicine

## 2019-08-20 NOTE — Progress Notes (Signed)
Pain Management Virtual Encounter Note - Virtual Visit via Telephone Telehealth (real-time audio visits between healthcare provider and patient).   Patient's Phone No. & Preferred Pharmacy:  [phone] (home); [phone] (mobile); (Preferred) [phone] No e-mail address on record  [place] [place] [facility] [place] [address] Phone: [phone] Fax: [phone]    Pre-screening note:  Our staff contacted Ms. Grimley and offered her an "in person", "face-to-face" appointment versus a telephone encounter. She indicated preferring the telephone encounter, at this time.   Reason for Virtual Visit: COVID-19*  Social distancing based on CDC and AMA recommendations.   I contacted Reita Shindler Maggio on 08/21/2019 via telephone.      I clearly identified myself as Gaspar Cola, MD. I verified that I was speaking with the correct person using two identifiers (Name: Aniylah Avans, and date of birth: [birth year]).  Advanced Informed Consent I sought verbal advanced consent from Tabiona for virtual visit interactions. I informed Ms. Dorval of possible security and privacy concerns, risks, and limitations associated with providing "not-in-person" medical evaluation and management services. I also informed Ms. Hoh of the availability of "in-person" appointments. Finally, I informed her that there would be a charge for the virtual visit and that she could be  personally, fully or partially, financially responsible for it. Ms. Ohmer expressed understanding and agreed to proceed.   Historic Elements   Ms. Jaquanda Wickersham Nahar is a 58 y.o. year old, female patient evaluated today after her last encounter by our practice on 08/16/2019. Ms. Higham  has a past medical history of Anxiety, Arm pain (07/25/2015), CHF (congestive heart failure) ([place]), Congestive heart failure ([place]) (07/25/2015), COPD (chronic obstructive pulmonary disease)  ([place]), Depression, Hypertension, Lower extremity pain (07/25/2015), Reflux, RSD (reflex sympathetic dystrophy), Spinal cord stimulator status, Vitamin B12 deficiency, and Vitamin D deficiency. She also  has a past surgical history that includes Spinal cord stimulator implant. Ms. Slovacek has a current medication list which includes the following prescription(s): albuterol, atorvastatin, carvedilol, furosemide, ipratropium-albuterol, omeprazole, oxycodone, oxycodone, oxycodone, polyethylene glycol, potassium chloride sa, ramelteon, and mometasone-formoterol. She  reports that she quit smoking about 16 months ago. Her smoking use included cigarettes. She has a 40.00 pack-year smoking history. She has never used smokeless tobacco. She reports that she does not drink alcohol or use drugs. Ms. Barrick is allergic to lisinopril.   HPI  Today, she is being contacted for medication management.  The patient indicates doing well with the current medication regimen. No adverse reactions or side effects reported to the medications.   Pharmacotherapy Assessment  Analgesic: Oxycodone IR 5 mg, 1 tab PO q 6 hrs (20 mg/day of oxycodone) MME/day:30 mg/day.   Monitoring: Pharmacotherapy: No side-effects or adverse reactions reported. [place] PMP: PDMP reviewed during this encounter.       Compliance: No problems identified. Effectiveness: Clinically acceptable. Plan: Refer to "POC".  UDS:  Summary  Date Value Ref Range Status  05/16/2018 FINAL  Final    Comment:    ==================================================================== TOXASSURE SELECT 13 (MW) ==================================================================== Specimen Alert Note:  Urinary creatinine is low; ability to detect some drugs may be compromised.  Interpret results with caution. ==================================================================== Test                             Result       Flag       Units Drug Present and Declared  for Prescription Verification   Oxycodone                      2816         EXPECTED   ng/mg creat   Oxymorphone                    789          EXPECTED   ng/mg creat   Noroxycodone                   2511         EXPECTED   ng/mg creat   Noroxymorphone                 311          EXPECTED   ng/mg creat    Sources of oxycodone are scheduled prescription medications.    Oxymorphone, noroxycodone, and noroxymorphone are expected    metabolites of oxycodone. Oxymorphone is also available as a    scheduled prescription medication. ==================================================================== Test                      Result    Flag   Units      Ref Range   Creatinine              19        L      mg/dL      >=60>=20 ==================================================================== Declared Medications:  The flagging and interpretation on this report are based on the  following declared medications.  Unexpected results may arise from  inaccuracies in the declared medications.  **Note: The testing scope of this panel includes these medications:  Oxycodone  **Note: The testing scope of this panel does not include following  reported medications:  Albuterol  Albuterol (Duoneb)  Atorvastatin (Lipitor)  Carvedilol (Coreg)  Furosemide (Lasix)  Ipratropium (Duoneb)  Melatonin  Omeprazole (Prilosec)  Polyethylene Glycol  Potassium ==================================================================== For clinical consultation, please call [phone]. ====================================================================    Laboratory Chemistry Profile (12 mo)  Renal: No results found for requested labs within last 8760 hours.  Lab Results  Component Value Date   GFRAA >60 08/18/2018   GFRNONAA >60 08/18/2018   Hepatic: No results found for requested labs within last 8760 hours. Lab Results  Component Value Date   AST 22 08/14/2018   ALT 19 08/14/2018   Other: No results  found for requested labs within last 8760 hours. Note: Above Lab results reviewed.  Imaging  Last 90 days:  No results found.  Assessment  The primary encounter diagnosis was Chronic pain syndrome. Diagnoses of Chronic lower extremity pain (Bilateral), Chronic pain of both upper extremities, Presence of spinal cord stimulator, and Pharmacologic therapy were also pertinent to this visit.  Plan of Care  I am having Siren L. Mcburney start on oxyCODONE and oxyCODONE. I am also having her maintain her omeprazole, potassium chloride SA, albuterol, polyethylene glycol, mometasone-formoterol, ipratropium-albuterol, furosemide, carvedilol, atorvastatin, ramelteon, and oxyCODONE.  Pharmacotherapy (Medications Ordered): Meds ordered this encounter  Medications  . oxyCODONE (OXY IR/ROXICODONE) 5 MG immediate release tablet    Sig: Take 1 tablet (5 mg total) by mouth every 6 (six) hours as needed for severe pain. Must last 30 days    Dispense:  120 tablet    Refill:  0    Chronic Pain: STOP Act (Not applicable) Fill 1 day early if closed on refill  date. Do not fill until: 08/31/2019. To last until: 09/30/2019. Avoid benzodiazepines within 8 hours of opioids  . oxyCODONE (OXY IR/ROXICODONE) 5 MG immediate release tablet    Sig: Take 1 tablet (5 mg total) by mouth every 6 (six) hours as needed for severe pain. Must last 30 days    Dispense:  120 tablet    Refill:  0    Chronic Pain: STOP Act (Not applicable) Fill 1 day early if closed on refill date. Do not fill until: 09/30/2019. To last until: 10/30/2019. Avoid benzodiazepines within 8 hours of opioids  . oxyCODONE (OXY IR/ROXICODONE) 5 MG immediate release tablet    Sig: Take 1 tablet (5 mg total) by mouth every 6 (six) hours as needed for severe pain. Must last 30 days    Dispense:  120 tablet    Refill:  0    Chronic Pain: STOP Act (Not applicable) Fill 1 day early if closed on refill date. Do not fill until: 10/30/2019. To last until: 11/29/2019.  Avoid benzodiazepines within 8 hours of opioids   Orders:  No orders of the defined types were placed in this encounter.  Follow-up plan:   No follow-ups on file.      Interventional therapies:  Considering:   Diagnostic bilateral lumbar facet block  Possible bilateral lumbar facet RFA  Possible bilateral lumbar sympathetic RFA    Palliative PRN treatment(s):   Palliative right vs left SGBs  Palliative  right vs left LSBs      Recent Visits No visits were found meeting these conditions.  Showing recent visits within past 90 days and meeting all other requirements   Today's Visits Date Type Provider Dept  08/21/19 Telemedicine Delano Metz, MD [facility]  Showing today's visits and meeting all other requirements   Future Appointments No visits were found meeting these conditions.  Showing future appointments within next 90 days and meeting all other requirements   I discussed the assessment and treatment plan with the patient. The patient was provided an opportunity to ask questions and all were answered. The patient agreed with the plan and demonstrated an understanding of the instructions.  Patient advised to call back or seek an in-person evaluation if the symptoms or condition worsens.  Total duration of non-face-to-face encounter: 12 minutes.  Note by: Oswaldo Done, MD Date: 08/21/2019; Time: 1:58 PM  Note: This dictation was prepared with Dragon dictation. Any transcriptional errors that may result from this process are unintentional.  Disclaimer:  * Given the special circumstances of the COVID-19 pandemic, the federal government has announced that the Office for Civil Rights (OCR) will exercise its enforcement discretion and will not impose penalties on physicians using telehealth in the event of noncompliance with regulatory requirements under the [employer] Portability and Accountability Act (HIPAA) in connection with the good faith  provision of telehealth during the COVID-19 national public health emergency. (AMA)

## 2019-08-21 ENCOUNTER — Ambulatory Visit: Payer: Medicaid Other | Attending: Pain Medicine | Admitting: Pain Medicine

## 2019-08-21 ENCOUNTER — Other Ambulatory Visit: Payer: Self-pay

## 2019-08-21 DIAGNOSIS — M79605 Pain in left leg: Secondary | ICD-10-CM

## 2019-08-21 DIAGNOSIS — M79604 Pain in right leg: Secondary | ICD-10-CM

## 2019-08-21 DIAGNOSIS — G894 Chronic pain syndrome: Secondary | ICD-10-CM | POA: Diagnosis not present

## 2019-08-21 DIAGNOSIS — M79601 Pain in right arm: Secondary | ICD-10-CM | POA: Diagnosis not present

## 2019-08-21 DIAGNOSIS — Z969 Presence of functional implant, unspecified: Secondary | ICD-10-CM

## 2019-08-21 DIAGNOSIS — G8929 Other chronic pain: Secondary | ICD-10-CM

## 2019-08-21 DIAGNOSIS — M79602 Pain in left arm: Secondary | ICD-10-CM

## 2019-08-21 DIAGNOSIS — Z79899 Other long term (current) drug therapy: Secondary | ICD-10-CM

## 2019-08-21 MED ORDER — OXYCODONE HCL 5 MG PO TABS: 5.0000 mg | ORAL_TABLET | Freq: Four times a day (QID) | ORAL | 0 refills | Status: DC | PRN

## 2019-10-31 ENCOUNTER — Encounter: Payer: Self-pay | Admitting: *Deleted

## 2019-10-31 IMAGING — DX DG ABDOMEN 1V
1 series · 1 of 1 positions shown · non-contrast
Comparison: [place] image for CT scan dated 09/10/2010

CLINICAL DATA: Abdominal discomfort. Patient reports no bowel
movement for 2-3 weeks.

EXAM:
ABDOMEN - 1 VIEW

[abdomen kub]
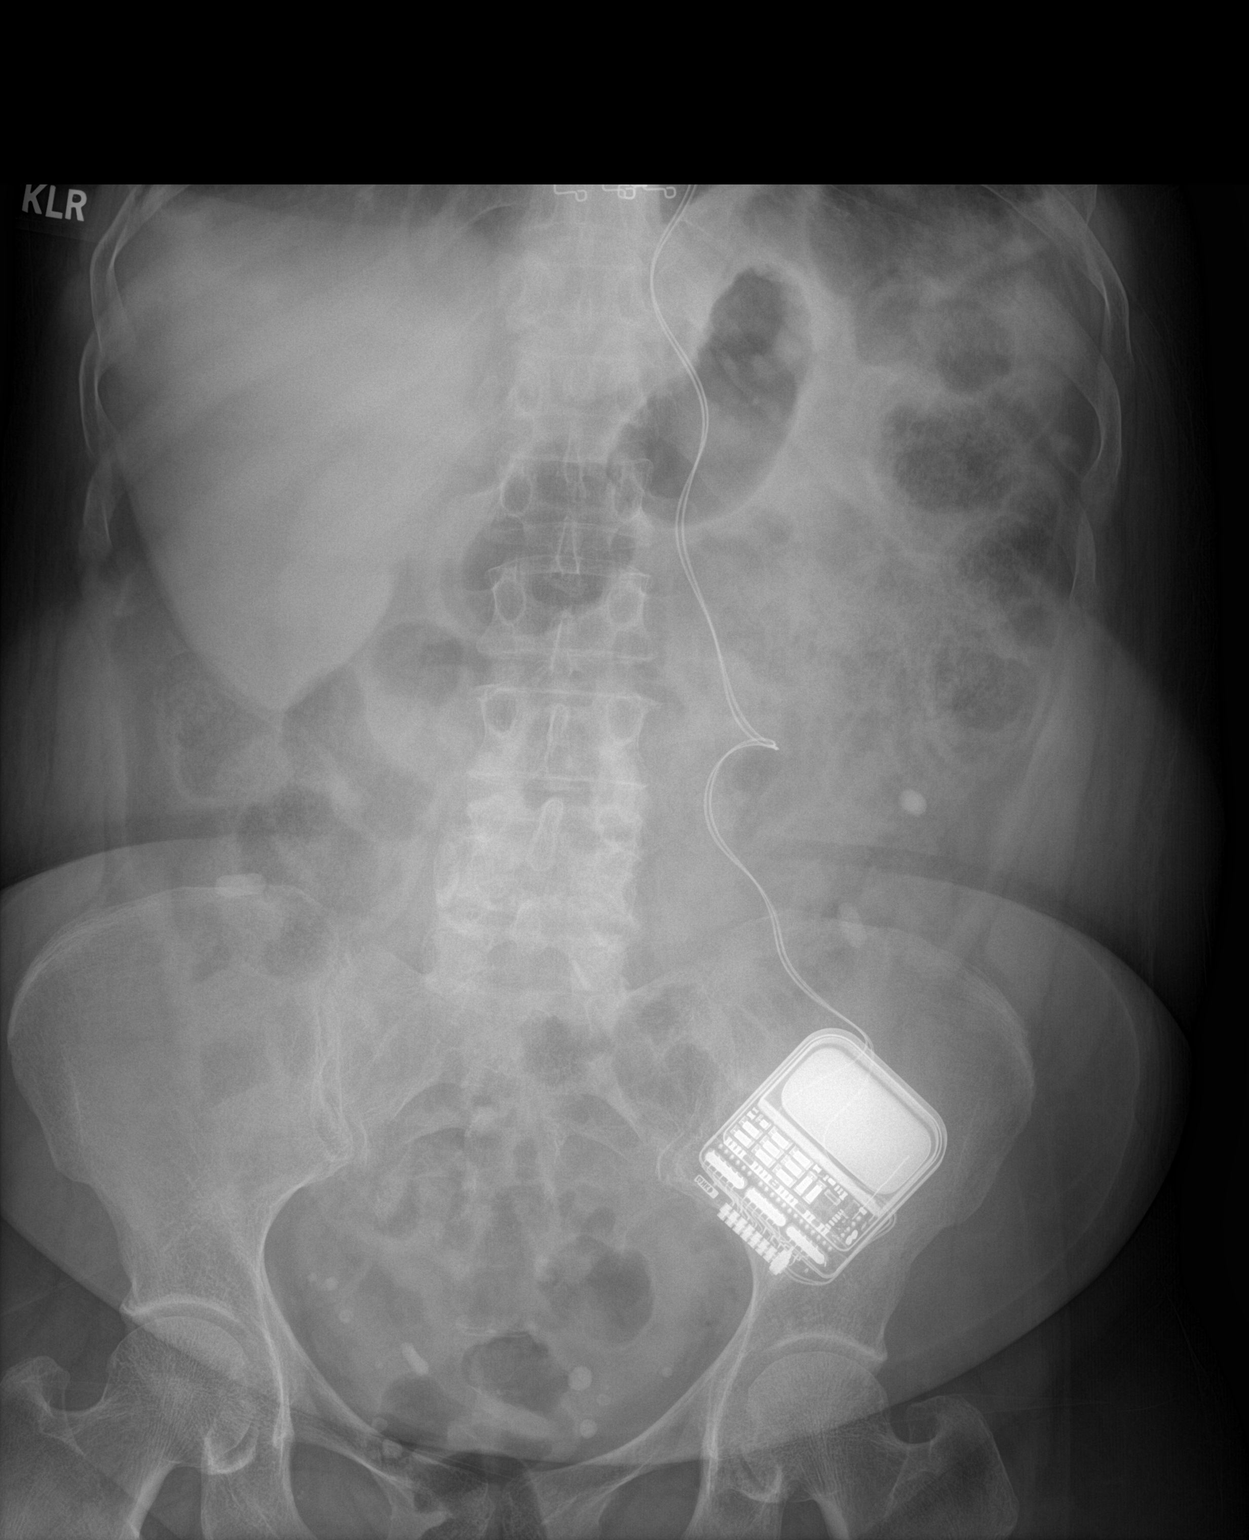

[1 of 1 positions shown; findings below may reference images not displayed]

FINDINGS: Bowel gas pattern is normal. No dilated bowel. There are multiple
tablets seen in the nondistended colon. No fecal impaction.

No visible free air or free fluid. Neurostimulator and wires are
visible, unchanged.

No acute bone abnormality.  Degenerative changes at L4-5.
IMPRESSION: Benign-appearing abdomen.

## 2019-11-01 ENCOUNTER — Telehealth: Payer: Self-pay | Admitting: *Deleted

## 2019-11-01 DIAGNOSIS — Z87891 Personal history of nicotine dependence: Secondary | ICD-10-CM

## 2019-11-01 NOTE — Telephone Encounter (Signed)
Patient has been notified that annual lung cancer screening low dose CT scan is due currently or will be in near future. Confirmed that patient is within the age range of 55-77, and asymptomatic, (no signs or symptoms of lung cancer). Patient denies illness that would prevent curative treatment for lung cancer if found. Verified smoking history, (former, quit 09/01/17, 40 pack year). The shared decision making visit was done 06/21/18. Patient is agreeable for CT scan being scheduled.

## 2019-11-06 ENCOUNTER — Other Ambulatory Visit: Payer: Self-pay

## 2019-11-06 ENCOUNTER — Ambulatory Visit
Admission: RE | Admit: 2019-11-06 | Discharge: 2019-11-06 | Disposition: A | Discharge status: Home / Self Care | Payer: Medicaid Other | Source: Ambulatory Visit | Attending: Oncology | Admitting: Oncology

## 2019-11-06 DIAGNOSIS — Z87891 Personal history of nicotine dependence: Secondary | ICD-10-CM | POA: Insufficient documentation

## 2019-11-08 ENCOUNTER — Encounter: Payer: Self-pay | Admitting: *Deleted

## 2019-11-21 ENCOUNTER — Encounter: Payer: Self-pay | Admitting: Pain Medicine

## 2019-11-21 NOTE — Progress Notes (Signed)
Patient: Elizabeth [name]  Service Category: E/M  Provider: Gaspar Cola, MD  DOB: Aug 12, 1961  DOS: 11/22/2019  Location: Office  MRN: [id number]  Setting: Ambulatory outpatient  Referring Provider: [name], NP  Type: Established Patient  Specialty: Interventional Pain Management  PCP: Elizabeth Cara, NP  Location: Remote location  Delivery: TeleHealth     Virtual Encounter - Pain Management PROVIDER NOTE: Information contained herein reflects review and annotations entered in association with encounter. Interpretation of such information and data should be left to medically-trained personnel. Information provided to patient can be located elsewhere in the medical record under "Patient Instructions". Document created using STT-dictation technology, any transcriptional errors that may result from process are unintentional.    Contact & Pharmacy Preferred: [phone] Home: [phone] (home) Mobile: [phone] (mobile) E-mail: No e-mail address on record  [place], [place] [address] [place] [address] Phone: [phone] Fax: [phone]   Pre-screening  Elizabeth [name] offered "in-person" vs "virtual" encounter. She indicated preferring virtual for this encounter.   Reason COVID-19*  Social distancing based on CDC and AMA recommendations.   I contacted Elizabeth [name] on 11/22/2019 via telephone.      I clearly identified myself as Elizabeth Cola, MD. I verified that I was speaking with the correct person using two identifiers (Name: Elizabeth [name], and date of birth: [birth year]).  Consent I sought verbal advanced consent from Elizabeth [name] for virtual visit interactions. I informed Elizabeth [name] of possible security and privacy concerns, risks, and limitations associated with providing "not-in-person" medical evaluation and management services. I also informed Elizabeth [name] of the availability of "in-person"  appointments. Finally, I informed her that there would be a charge for the virtual visit and that she could be  personally, fully or partially, financially responsible for it. Elizabeth [name] expressed understanding and agreed to proceed.   Historic Elements   Elizabeth [name] is a 59 y.o. year old, female patient evaluated today after her last contact with our practice on 08/16/2019. Elizabeth [name]  has a past medical history of Anxiety, Arm pain (07/25/2015), CHF (congestive heart failure) ([place]), Congestive heart failure ([place]) (07/25/2015), COPD (chronic obstructive pulmonary disease) ([place]), Depression, Hypertension, Lower extremity pain (07/25/2015), Reflux, RSD (reflex sympathetic dystrophy), Spinal cord stimulator status, Vitamin B12 deficiency, and Vitamin D deficiency. She also  has a past surgical history that includes Spinal cord stimulator implant. Elizabeth [name] has a current medication list which includes the following prescription(s): albuterol, atorvastatin, carvedilol, furosemide, ipratropium-albuterol, mometasone-formoterol, omeprazole, oxycodone, polyethylene glycol, potassium chloride sa, ramelteon, and spironolactone. She  reports that she quit smoking about 19 months ago. Her smoking use included cigarettes. She has a 40.00 pack-year smoking history. She has never used smokeless tobacco. She reports that she does not drink alcohol or use drugs. Elizabeth [name] is allergic to lisinopril.   HPI  Today, she is being contacted for medication management.  The patient indicates doing well with the current medication regimen. No adverse reactions or side effects reported to the medications.   Pharmacotherapy Assessment  Analgesic: Oxycodone IR 5 mg, 1 tab PO q 6 hrs (20 mg/day of oxycodone) MME/day:30 mg/day.   Monitoring:  PMP: PDMP reviewed during this encounter.       Pharmacotherapy: No side-effects or adverse reactions reported. Compliance: No problems identified. Effectiveness:  Clinically acceptable. Plan: Refer to "POC".  UDS:  Summary  Date Value Ref Range Status  05/16/2018 FINAL  Final    Comment:    ==================================================================== TOXASSURE SELECT 13 (MW) ==================================================================== Specimen Alert Note:  Urinary creatinine is low; ability to detect some drugs may be compromised.  Interpret results with caution. ==================================================================== Test                             Result       Flag       Units Drug Present and Declared for Prescription Verification   Oxycodone                      2816         EXPECTED   ng/mg creat   Oxymorphone                    789          EXPECTED   ng/mg creat   Noroxycodone                   2511         EXPECTED   ng/mg creat   Noroxymorphone                 311          EXPECTED   ng/mg creat    Sources of oxycodone are scheduled prescription medications.    Oxymorphone, noroxycodone, and noroxymorphone are expected    metabolites of oxycodone. Oxymorphone is also available as a    scheduled prescription medication. ==================================================================== Test                      Result    Flag   Units      Ref Range   Creatinine              19        L      mg/dL      >=20 ==================================================================== Declared Medications:  The flagging and interpretation on this report are based on the  following declared medications.  Unexpected results may arise from  inaccuracies in the declared medications.  **Note: The testing scope of this panel includes these medications:  Oxycodone  **Note: The testing scope of this panel does not include following  reported medications:  Albuterol  Albuterol (Duoneb)  Atorvastatin (Lipitor)  Carvedilol (Coreg)  Furosemide (Lasix)  Ipratropium (Duoneb)  Melatonin  Omeprazole (Prilosec)   Polyethylene Glycol  Potassium ==================================================================== For clinical consultation, please call [phone]. ====================================================================    Laboratory Chemistry Profile   Renal Lab Results  Component Value Date   BUN 25 (H) 08/18/2018   CREATININE 0.76 08/18/2018   BCR 17 08/30/2017   GFRAA >60 08/18/2018   GFRNONAA >60 08/18/2018    Hepatic Lab Results  Component Value Date   AST 22 08/14/2018   ALT 19 08/14/2018   ALBUMIN 4.1 08/14/2018   ALKPHOS 121 08/14/2018    Electrolytes Lab Results  Component Value Date   NA 140 08/18/2018   K 4.1 08/18/2018   CL 103 08/18/2018   CALCIUM 8.7 (L) 08/18/2018   MG 2.6 (H) 04/18/2018   PHOS 4.1 04/16/2018    Bone Lab Results  Component Value Date   VD25OH 25.3 (L) 03/18/2016   VD125OH2TOT 76.2 03/18/2016   25OHVITD1 37 08/30/2017   25OHVITD2 1.0 08/30/2017   25OHVITD3 36 08/30/2017    Coagulation Lab Results  Component Value Date  PLT 211 08/14/2018    Cardiovascular Lab Results  Component Value Date   BNP 19.0 08/14/2018   CKTOTAL 55 03/10/2014   CKMB < 0.5 (L) 02/16/2014   TROPONINI <0.03 04/11/2018   HGB 12.9 08/14/2018   HCT 38.5 08/14/2018    Inflammation (CRP: Acute Phase) (ESR: Chronic Phase) Lab Results  Component Value Date   CRP 4.4 08/30/2017   ESRSEDRATE 30 08/30/2017      Note: Above Lab results reviewed.  Imaging  CT CHEST LUNG CANCER SCREENING LOW DOSE WO CONTRAST CLINICAL DATA:  59 year old female former smoker (quit 3 years ago) with 40 pack-year history of smoking. Lung cancer screening examination.  EXAM: CT CHEST WITHOUT CONTRAST LOW-DOSE FOR LUNG CANCER SCREENING  TECHNIQUE: Multidetector CT imaging of the chest was performed following the standard protocol without IV contrast.  COMPARISON:  Low-dose lung cancer screening chest CT 06/21/2018.  FINDINGS: Cardiovascular: Heart size is normal.  There is no significant pericardial fluid, thickening or pericardial calcification. There is aortic atherosclerosis, as well as atherosclerosis of the great vessels of the mediastinum and the coronary arteries, including calcified atherosclerotic plaque in the left anterior descending, left circumflex and right coronary arteries.  Mediastinum/Nodes: No pathologically enlarged mediastinal or hilar lymph nodes. Please note that accurate exclusion of hilar adenopathy is limited on noncontrast CT scans. Esophagus is unremarkable in appearance. No axillary lymphadenopathy.  Lungs/Pleura: Multiple small pulmonary nodules are again noted throughout the lungs bilaterally, largest of which is in the medial aspect of the left upper lobe (axial image 62 of series 3), with a volume derived mean diameter of only 3.7 mm. No larger more suspicious appearing pulmonary nodules or masses are noted. No acute consolidative airspace disease. No pleural effusions. Mild diffuse bronchial wall thickening with mild centrilobular and paraseptal emphysema.  Upper Abdomen: Aortic atherosclerosis.  Musculoskeletal: There are no aggressive appearing lytic or blastic lesions noted in the visualized portions of the skeleton. Spinal cord stimulator incidentally noted.  IMPRESSION: 1. Lung-RADS 2S, benign appearance or behavior. Continue annual screening with low-dose chest CT without contrast in 12 months. 2. The "S" modifier above refers to potentially clinically significant non lung cancer related findings. Specifically, there is aortic atherosclerosis, in addition to 3 vessel coronary artery disease. Please note that although the presence of coronary artery calcium documents the presence of coronary artery disease, the severity of this disease and any potential stenosis cannot be assessed on this non-gated CT examination. Assessment for potential risk factor modification, dietary therapy or pharmacologic  therapy may be warranted, if clinically indicated. 3. Mild diffuse bronchial wall thickening with mild centrilobular and paraseptal emphysema; imaging findings suggestive of underlying COPD.  Aortic Atherosclerosis (ICD10-I70.0) and Emphysema (ICD10-J43.9).  Electronically Signed   By: Vinnie Langton M.D.   On: 11/07/2019 09:09  Assessment  The primary encounter diagnosis was Chronic pain syndrome. Diagnoses of Complex regional pain syndrome type 1 of both upper extremities, Chronic upper extremity pain (Bilateral), and Chronic lower extremity pain (Bilateral) were also pertinent to this visit.  Plan of Care  Problem-specific:  No problem-specific Assessment & Plan notes found for this encounter.  I am having Jakya L. Furukawa maintain her omeprazole, potassium chloride SA, albuterol, polyethylene glycol, mometasone-formoterol, ipratropium-albuterol, furosemide, carvedilol, atorvastatin, ramelteon, oxyCODONE, and spironolactone.  Pharmacotherapy (Medications Ordered): No orders of the defined types were placed in this encounter.  Orders:  No orders of the defined types were placed in this encounter.  Follow-up plan:   Return in about 3 months (around  02/26/2020) for (VV), (MM).      Interventional therapies:  Considering:   Diagnostic bilateral lumbar facet block  Possible bilateral lumbar facet RFA  Possible bilateral lumbar sympathetic RFA    Palliative PRN treatment(s):   Palliative right vs left SGBs  Palliative  right vs left LSBs       Recent Visits No visits were found meeting these conditions.  Showing recent visits within past 90 days and meeting all other requirements   Today's Visits Date Type Provider Dept  11/22/19 Office Visit Milinda Pointer, MD [facility]  Showing today's visits and meeting all other requirements   Future Appointments No visits were found meeting these conditions.  Showing future appointments within next 90 days and  meeting all other requirements   I discussed the assessment and treatment plan with the patient. The patient was provided an opportunity to ask questions and all were answered. The patient agreed with the plan and demonstrated an understanding of the instructions.  Patient advised to call back or seek an in-person evaluation if the symptoms or condition worsens.  Duration of encounter: 12 minutes.  Note by: Elizabeth Cola, MD Date: 11/22/2019; Time: 7:56 AM

## 2019-11-22 ENCOUNTER — Other Ambulatory Visit: Payer: Self-pay

## 2019-11-22 ENCOUNTER — Ambulatory Visit: Payer: Medicaid Other | Attending: Pain Medicine | Admitting: Pain Medicine

## 2019-11-22 DIAGNOSIS — M79601 Pain in right arm: Secondary | ICD-10-CM | POA: Diagnosis not present

## 2019-11-22 DIAGNOSIS — G8929 Other chronic pain: Secondary | ICD-10-CM

## 2019-11-22 DIAGNOSIS — M79604 Pain in right leg: Secondary | ICD-10-CM

## 2019-11-22 DIAGNOSIS — G894 Chronic pain syndrome: Secondary | ICD-10-CM | POA: Diagnosis not present

## 2019-11-22 DIAGNOSIS — G90513 Complex regional pain syndrome I of upper limb, bilateral: Secondary | ICD-10-CM

## 2019-11-22 DIAGNOSIS — M79602 Pain in left arm: Secondary | ICD-10-CM

## 2019-11-22 DIAGNOSIS — M79605 Pain in left leg: Secondary | ICD-10-CM

## 2019-11-22 MED ORDER — OXYCODONE HCL 5 MG PO TABS: 5.0000 mg | ORAL_TABLET | Freq: Four times a day (QID) | ORAL | 0 refills | Status: DC | PRN

## 2020-01-19 ENCOUNTER — Other Ambulatory Visit: Payer: Self-pay | Admitting: Family Medicine

## 2020-01-19 DIAGNOSIS — Z1231 Encounter for screening mammogram for malignant neoplasm of breast: Secondary | ICD-10-CM

## 2020-02-21 ENCOUNTER — Other Ambulatory Visit: Payer: Self-pay | Admitting: Pain Medicine

## 2020-02-21 DIAGNOSIS — G894 Chronic pain syndrome: Secondary | ICD-10-CM

## 2020-02-22 ENCOUNTER — Encounter: Payer: Self-pay | Admitting: Pain Medicine

## 2020-02-25 NOTE — Progress Notes (Signed)
Patient: Elizabeth [name] [name]  Service Category: E/M  Provider: Gaspar Cola, MD  DOB: 1961/01/18  DOS: 02/26/2020  Location: Office  MRN: [id number]  Setting: Ambulatory outpatient  Referring Provider: [name], NP  Type: Established Patient  Specialty: Interventional Pain Management  PCP: [name], NP  Location: Remote location  Delivery: TeleHealth     Virtual Encounter - Pain Management PROVIDER NOTE: Information contained herein reflects review and annotations entered in association with encounter. Interpretation of such information and data should be left to medically-trained personnel. Information provided to patient can be located elsewhere in the medical record under "Patient Instructions". Document created using STT-dictation technology, any transcriptional errors that may result from process are unintentional.    Contact & Pharmacy Preferred: [phone] Home: [phone] (home) Mobile: [phone] (mobile) E-mail: No e-mail address on record  [facility] [address] [place] [address] Phone: [phone] Fax: [phone]   Pre-screening  Elizabeth [name] [name] offered "in-person" vs "virtual" encounter. She indicated preferring virtual for this encounter.   Reason COVID-19*  Social distancing based on CDC and AMA recommendations.   I contacted Elizabeth [name] [name] on 02/26/2020 via telephone.      I clearly identified myself as Gaspar Cola, MD. I verified that I was speaking with the correct person using two identifiers (Name: Elizabeth [name] [name], and date of birth: 11-25-1960).  Consent I sought verbal advanced consent from Elizabeth [name] for virtual visit interactions. I informed Elizabeth [name] [name] of possible security and privacy concerns, risks, and limitations associated with providing "not-in-person" medical evaluation and management services. I also informed Elizabeth [name] [name] of the availability of "in-person"  appointments. Finally, I informed her that there would be a charge for the virtual visit and that she could be  personally, fully or partially, financially responsible for it. Elizabeth [name] [name] expressed understanding and agreed to proceed.   Historic Elements   Elizabeth [name] [name] is a 59 y.o. year old, female patient evaluated today after her last contact with our practice on 02/21/2020. Elizabeth [name] [name]  has a past medical history of Anxiety, Arm pain (07/25/2015), CHF (congestive heart failure) ([place]), Congestive heart failure ([place]) (07/25/2015), COPD (chronic obstructive pulmonary disease) ([place]), Depression, Hypertension, Lower extremity pain (07/25/2015), Reflux, RSD (reflex sympathetic dystrophy), Spinal cord stimulator status, Vitamin B12 deficiency, and Vitamin D deficiency. She also  has a past surgical history that includes Spinal cord stimulator implant. Elizabeth [name] [name] has a current medication list which includes the following prescription(s): albuterol, atorvastatin, carvedilol, furosemide, ipratropium-albuterol, omeprazole, [START ON 02/27/2020] oxycodone, [START ON 03/28/2020] oxycodone, [START ON 04/27/2020] oxycodone, polyethylene glycol, potassium chloride sa, ramelteon, spironolactone, and mometasone-formoterol. She  reports that she quit smoking about 22 months ago. Her smoking use included cigarettes. She has a 40.00 pack-year smoking history. She has never used smokeless tobacco. She reports that she does not drink alcohol or use drugs. Elizabeth [name] [name] is allergic to lisinopril.   HPI  Today, she is being contacted for medication management. The patient indicates doing well with the current medication regimen. No adverse reactions or side effects reported to the medications.   Pharmacotherapy Assessment  Analgesic: Oxycodone IR 5 mg, 1 tab PO q 6 hrs (20 mg/day of oxycodone) MME/day:30 mg/day.   Monitoring: [place] PMP: PDMP reviewed during this encounter.       Pharmacotherapy: No side-effects or  adverse reactions reported. Compliance: No problems identified. Effectiveness: Clinically acceptable. Plan: Refer to "POC".  UDS:  Summary  Date Value Ref Range Status  05/16/2018 FINAL  Final    Comment:    ==================================================================== TOXASSURE SELECT 13 (MW) ==================================================================== Specimen Alert Note:  Urinary creatinine is low; ability to detect some drugs may be compromised.  Interpret results with caution. ==================================================================== Test                             Result       Flag       Units Drug Present and Declared for Prescription Verification   Oxycodone                      2816         EXPECTED   ng/mg creat   Oxymorphone                    789          EXPECTED   ng/mg creat   Noroxycodone                   2511         EXPECTED   ng/mg creat   Noroxymorphone                 311          EXPECTED   ng/mg creat    Sources of oxycodone are scheduled prescription medications.    Oxymorphone, noroxycodone, and noroxymorphone are expected    metabolites of oxycodone. Oxymorphone is also available as a    scheduled prescription medication. ==================================================================== Test                      Result    Flag   Units      Ref Range   Creatinine              19        L      mg/dL      >=20 ==================================================================== Declared Medications:  The flagging and interpretation on this report are based on the  following declared medications.  Unexpected results may arise from  inaccuracies in the declared medications.  **Note: The testing scope of this panel includes these medications:  Oxycodone  **Note: The testing scope of this panel does not include following  reported medications:  Albuterol  Albuterol (Duoneb)  Atorvastatin (Lipitor)  Carvedilol (Coreg)   Furosemide (Lasix)  Ipratropium (Duoneb)  Melatonin  Omeprazole (Prilosec)  Polyethylene Glycol  Potassium ==================================================================== For clinical consultation, please call [phone]. ====================================================================    Laboratory Chemistry Profile   Renal Lab Results  Component Value Date   BUN 25 (H) 08/18/2018   CREATININE 0.76 08/18/2018   BCR 17 08/30/2017   GFRAA >60 08/18/2018   GFRNONAA >60 08/18/2018     Hepatic Lab Results  Component Value Date   AST 22 08/14/2018   ALT 19 08/14/2018   ALBUMIN 4.1 08/14/2018   ALKPHOS 121 08/14/2018     Electrolytes Lab Results  Component Value Date   NA 140 08/18/2018   K 4.1 08/18/2018   CL 103 08/18/2018   CALCIUM 8.7 (L) 08/18/2018   MG 2.6 (H) 04/18/2018   PHOS 4.1 04/16/2018     Bone Lab Results  Component Value Date   VD25OH 25.3 (L) 03/18/2016   VD125OH2TOT 76.2 03/18/2016   25OHVITD1 37 08/30/2017   25OHVITD2 1.0 08/30/2017   25OHVITD3 36 08/30/2017  Inflammation (CRP: Acute Phase) (ESR: Chronic Phase) Lab Results  Component Value Date   CRP 4.4 08/30/2017   ESRSEDRATE 30 08/30/2017       Note: Above Lab results reviewed.  Imaging  CT CHEST LUNG CANCER SCREENING LOW DOSE WO CONTRAST CLINICAL DATA:  58 year old female former smoker (quit 3 years ago) with 40 pack-year history of smoking. Lung cancer screening examination.  EXAM: CT CHEST WITHOUT CONTRAST LOW-DOSE FOR LUNG CANCER SCREENING  TECHNIQUE: Multidetector CT imaging of the chest was performed following the standard protocol without IV contrast.  COMPARISON:  Low-dose lung cancer screening chest CT 06/21/2018.  FINDINGS: Cardiovascular: Heart size is normal. There is no significant pericardial fluid, thickening or pericardial calcification. There is aortic atherosclerosis, as well as atherosclerosis of the great vessels of the mediastinum and  the coronary arteries, including calcified atherosclerotic plaque in the left anterior descending, left circumflex and right coronary arteries.  Mediastinum/Nodes: No pathologically enlarged mediastinal or hilar lymph nodes. Please note that accurate exclusion of hilar adenopathy is limited on noncontrast CT scans. Esophagus is unremarkable in appearance. No axillary lymphadenopathy.  Lungs/Pleura: Multiple small pulmonary nodules are again noted throughout the lungs bilaterally, largest of which is in the medial aspect of the left upper lobe (axial image 62 of series 3), with a volume derived mean diameter of only 3.7 mm. No larger more suspicious appearing pulmonary nodules or masses are noted. No acute consolidative airspace disease. No pleural effusions. Mild diffuse bronchial wall thickening with mild centrilobular and paraseptal emphysema.  Upper Abdomen: Aortic atherosclerosis.  Musculoskeletal: There are no aggressive appearing lytic or blastic lesions noted in the visualized portions of the skeleton. Spinal cord stimulator incidentally noted.  IMPRESSION: 1. Lung-RADS 2S, benign appearance or behavior. Continue annual screening with low-dose chest CT without contrast in 12 months. 2. The "S" modifier above refers to potentially clinically significant non lung cancer related findings. Specifically, there is aortic atherosclerosis, in addition to 3 vessel coronary artery disease. Please note that although the presence of coronary artery calcium documents the presence of coronary artery disease, the severity of this disease and any potential stenosis cannot be assessed on this non-gated CT examination. Assessment for potential risk factor modification, dietary therapy or pharmacologic therapy may be warranted, if clinically indicated. 3. Mild diffuse bronchial wall thickening with mild centrilobular and paraseptal emphysema; imaging findings suggestive of  underlying COPD.  Aortic Atherosclerosis (ICD10-I70.0) and Emphysema (ICD10-J43.9).  Electronically Signed   By: Vinnie Langton M.D.   On: 11/07/2019 09:09  Assessment  The primary encounter diagnosis was Chronic pain syndrome. Diagnoses of Complex regional pain syndrome type 1 of both upper extremities, Chronic upper extremity pain (Bilateral), Chronic lower extremity pain (Bilateral), Presence of spinal cord stimulator, and Pharmacologic therapy were also pertinent to this visit.  Plan of Care  Problem-specific:  No problem-specific Assessment & Plan notes found for this encounter.  Elizabeth [name] [name] has a current medication list which includes the following long-term medication(s): albuterol, atorvastatin, carvedilol, furosemide, ipratropium-albuterol, omeprazole, [START ON 02/27/2020] oxycodone, [START ON 03/28/2020] oxycodone, [START ON 04/27/2020] oxycodone, potassium chloride sa, ramelteon, spironolactone, and mometasone-formoterol.  Pharmacotherapy (Medications Ordered): Meds ordered this encounter  Medications  . oxyCODONE (OXY IR/ROXICODONE) 5 MG immediate release tablet    Sig: Take 1 tablet (5 mg total) by mouth every 6 (six) hours as needed for severe pain. Must last 30 days    Dispense:  120 tablet    Refill:  0    Chronic Pain: STOP  Act (Not applicable) Fill 1 day early if closed on refill date. Do not fill until: 02/27/2020. To last until: 03/28/2020. Avoid benzodiazepines within 8 hours of opioids  . oxyCODONE (OXY IR/ROXICODONE) 5 MG immediate release tablet    Sig: Take 1 tablet (5 mg total) by mouth every 6 (six) hours as needed for severe pain. Must last 30 days    Dispense:  120 tablet    Refill:  0    Chronic Pain: STOP Act (Not applicable) Fill 1 day early if closed on refill date. Do not fill until: 03/28/2020. To last until: 04/27/2020. Avoid benzodiazepines within 8 hours of opioids  . oxyCODONE (OXY IR/ROXICODONE) 5 MG immediate release tablet    Sig:  Take 1 tablet (5 mg total) by mouth every 6 (six) hours as needed for severe pain. Must last 30 days    Dispense:  120 tablet    Refill:  0    Chronic Pain: STOP Act (Not applicable) Fill 1 day early if closed on refill date. Do not fill until: 04/27/2020. To last until: 05/27/2020. Avoid benzodiazepines within 8 hours of opioids   Orders:  Orders Placed This Encounter  Procedures  . ToxASSURE Select 13 (MW), Urine    Volume: 30 ml(s). Minimum 3 ml of urine is needed. Document temperature of fresh sample. Indications: Long term (current) use of opiate analgesic (Q19.758)    Order Specific Question:   Release to patient    Answer:   Immediate   Follow-up plan:   Return in about 13 weeks (around 05/27/2020) for (F2F), (MM).      Interventional therapies:  Considering:   Diagnostic bilateral lumbar facet block  Possible bilateral lumbar facet RFA  Possible bilateral lumbar sympathetic RFA    Palliative PRN treatment(s):   Palliative right vs left SGBs  Palliative  right vs left LSBs        Recent Visits No visits were found meeting these conditions.  Showing recent visits within past 90 days and meeting all other requirements   Today's Visits Date Type Provider Dept  02/26/20 Telemedicine Milinda Pointer, MD [facility]  Showing today's visits and meeting all other requirements   Future Appointments No visits were found meeting these conditions.  Showing future appointments within next 90 days and meeting all other requirements   I discussed the assessment and treatment plan with the patient. The patient was provided an opportunity to ask questions and all were answered. The patient agreed with the plan and demonstrated an understanding of the instructions.  Patient advised to call back or seek an in-person evaluation if the symptoms or condition worsens.  Duration of encounter: 12 minutes.  Note by: Gaspar Cola, MD Date: 02/26/2020; Time: 8:47 AM

## 2020-02-26 ENCOUNTER — Other Ambulatory Visit: Payer: Self-pay

## 2020-02-26 ENCOUNTER — Telehealth: Payer: Self-pay | Admitting: *Deleted

## 2020-02-26 ENCOUNTER — Ambulatory Visit: Payer: Medicaid Other | Attending: Pain Medicine | Admitting: Pain Medicine

## 2020-02-26 DIAGNOSIS — M79604 Pain in right leg: Secondary | ICD-10-CM | POA: Diagnosis not present

## 2020-02-26 DIAGNOSIS — G894 Chronic pain syndrome: Secondary | ICD-10-CM | POA: Diagnosis not present

## 2020-02-26 DIAGNOSIS — M79601 Pain in right arm: Secondary | ICD-10-CM

## 2020-02-26 DIAGNOSIS — G8929 Other chronic pain: Secondary | ICD-10-CM

## 2020-02-26 DIAGNOSIS — Z969 Presence of functional implant, unspecified: Secondary | ICD-10-CM

## 2020-02-26 DIAGNOSIS — M79605 Pain in left leg: Secondary | ICD-10-CM

## 2020-02-26 DIAGNOSIS — G90513 Complex regional pain syndrome I of upper limb, bilateral: Secondary | ICD-10-CM | POA: Diagnosis not present

## 2020-02-26 DIAGNOSIS — M79602 Pain in left arm: Secondary | ICD-10-CM

## 2020-02-26 DIAGNOSIS — Z79899 Other long term (current) drug therapy: Secondary | ICD-10-CM

## 2020-02-26 MED ORDER — OXYCODONE HCL 5 MG PO TABS: 5.0000 mg | ORAL_TABLET | Freq: Four times a day (QID) | ORAL | 0 refills | Status: DC | PRN

## 2020-02-28 ENCOUNTER — Telehealth: Payer: Self-pay | Admitting: Pain Medicine

## 2020-02-28 NOTE — Telephone Encounter (Signed)
Patient advised that UDS must be done before any new scipts can be written.

## 2020-02-28 NOTE — Telephone Encounter (Signed)
Patient called about UDS, she wont be able to get back to [facility] until June, her son had emergency appendectomy.

## 2020-03-19 LAB — TOXASSURE SELECT 13 (MW), URINE

## 2020-03-28 ENCOUNTER — Other Ambulatory Visit: Payer: Self-pay

## 2020-03-28 ENCOUNTER — Telehealth: Payer: Self-pay | Admitting: *Deleted

## 2020-03-28 NOTE — Telephone Encounter (Signed)
Attempted to call patient. No voicemail.

## 2020-05-27 ENCOUNTER — Ambulatory Visit: Payer: Medicaid Other | Admitting: Pain Medicine

## 2020-05-27 ENCOUNTER — Other Ambulatory Visit: Payer: Self-pay

## 2020-05-27 ENCOUNTER — Telehealth: Payer: Self-pay | Admitting: *Deleted

## 2020-05-27 ENCOUNTER — Ambulatory Visit: Payer: Medicaid Other | Attending: Pain Medicine | Admitting: Pain Medicine

## 2020-05-27 ENCOUNTER — Encounter: Payer: Self-pay | Admitting: Pain Medicine

## 2020-05-27 VITALS — BP 125/70 | HR 74 | Temp 98.6°F | Resp 18 | Ht <= 58 in | Wt 120.0 lb

## 2020-05-27 DIAGNOSIS — G8929 Other chronic pain: Secondary | ICD-10-CM | POA: Diagnosis present

## 2020-05-27 DIAGNOSIS — G90513 Complex regional pain syndrome I of upper limb, bilateral: Secondary | ICD-10-CM | POA: Insufficient documentation

## 2020-05-27 DIAGNOSIS — Z969 Presence of functional implant, unspecified: Secondary | ICD-10-CM | POA: Diagnosis present

## 2020-05-27 DIAGNOSIS — F1120 Opioid dependence, uncomplicated: Secondary | ICD-10-CM | POA: Insufficient documentation

## 2020-05-27 DIAGNOSIS — G894 Chronic pain syndrome: Secondary | ICD-10-CM | POA: Diagnosis not present

## 2020-05-27 DIAGNOSIS — M79605 Pain in left leg: Secondary | ICD-10-CM | POA: Insufficient documentation

## 2020-05-27 DIAGNOSIS — M79604 Pain in right leg: Secondary | ICD-10-CM | POA: Diagnosis not present

## 2020-05-27 DIAGNOSIS — Z79899 Other long term (current) drug therapy: Secondary | ICD-10-CM | POA: Diagnosis present

## 2020-05-27 DIAGNOSIS — G90523 Complex regional pain syndrome I of lower limb, bilateral: Secondary | ICD-10-CM | POA: Insufficient documentation

## 2020-05-27 MED ORDER — MORPHINE SULFATE ER 15 MG PO TBCR: 15.0000 mg | EXTENDED_RELEASE_TABLET | Freq: Two times a day (BID) | ORAL | 0 refills | Status: DC

## 2020-05-27 NOTE — Patient Instructions (Signed)
____________________________________________________________________________________________  Drug Holidays (Slow)  What is a "Drug Holiday"? Drug Holiday: is the name given to the period of time during which a patient stops taking a medication(s) for the purpose of eliminating tolerance to the drug.  Benefits . Improved effectiveness of opioids. . Decreased opioid dose needed to achieve benefits. . Improved pain with lesser dose.  What is tolerance? Tolerance: is the progressive decreased in effectiveness of a drug due to its repetitive use. With repetitive use, the body gets use to the medication and as a consequence, it loses its effectiveness. This is a common problem seen with opioid pain medications. As a result, a larger dose of the drug is needed to achieve the same effect that used to be obtained with a smaller dose.  How long should a "Drug Holiday" last? You should stay off of the pain medicine for at least 14 consecutive days. (2 weeks)  Should I stop the medicine "cold turkey"? No. You should always coordinate with your Pain Specialist so that he/she can provide you with the correct medication dose to make the transition as smoothly as possible.  How do I stop the medicine? Slowly. You will be instructed to decrease the daily amount of pills that you take by one (1) pill every seven (7) days. This is called a "slow downward taper" of your dose. For example: if you normally take four (4) pills per day, you will be asked to drop this dose to three (3) pills per day for seven (7) days, then to two (2) pills per day for seven (7) days, then to one (1) per day for seven (7) days, and at the end of those last seven (7) days, this is when the "Drug Holiday" would start.   Will I have withdrawals? By doing a "slow downward taper" like this one, it is unlikely that you will experience any significant withdrawal symptoms. Typically, what triggers withdrawals is the sudden stop of a high  dose opioid therapy. Withdrawals can usually be avoided by slowly decreasing the dose over a prolonged period of time. If you do not follow these instructions and decide to stop your medication abruptly, withdrawals may be possible.  What are withdrawals? Withdrawals: refers to the wide range of symptoms that occur after stopping or dramatically reducing opiate drugs after heavy and prolonged use. Withdrawal symptoms do not occur to patients that use low dose opioids, or those who take the medication sporadically. Contrary to benzodiazepine (example: Valium, Xanax, etc.) or alcohol withdrawals ("Delirium Tremens"), opioid withdrawals are not lethal. Withdrawals are the physical manifestation of the body getting rid of the excess receptors.  Expected Symptoms Early symptoms of withdrawal may include: . Agitation . Anxiety . Muscle aches . Increased tearing . Insomnia . Runny nose . Sweating . Yawning  Late symptoms of withdrawal may include: . Abdominal cramping . Diarrhea . Dilated pupils . Goose bumps . Nausea . Vomiting  Will I experience withdrawals? Due to the slow nature of the taper, it is very unlikely that you will experience any.  What is a slow taper? Taper: refers to the gradual decrease in dose.  (Last update: 05/01/2020) ____________________________________________________________________________________________    ____________________________________________________________________________________________  Medication Rules  Purpose: To inform patients, and their family members, of our rules and regulations.  Applies to: All patients receiving prescriptions (written or electronic).  Pharmacy of record: Pharmacy where electronic prescriptions will be sent. If written prescriptions are taken to a different pharmacy, please inform the nursing staff. The pharmacy   listed in the electronic medical record should be the one where you would like electronic prescriptions  to be sent.  Electronic prescriptions: In compliance with the [place] Strengthen Opioid Misuse Prevention (STOP) Act of 2017 (Session Law 2017-74/H243), effective October 12, 2018, all controlled substances must be electronically prescribed. Calling prescriptions to the pharmacy will cease to exist.  Prescription refills: Only during scheduled appointments. Applies to all prescriptions.  NOTE: The following applies primarily to controlled substances (Opioid* Pain Medications).   Type of encounter (visit): For patients receiving controlled substances, face-to-face visits are required. (Not an option or up to the patient.)  Patient's responsibilities: 1. Pain Pills: Bring all pain pills to every appointment (except for procedure appointments). 2. Pill Bottles: Bring pills in original pharmacy bottle. Always bring the newest bottle. Bring bottle, even if empty. 3. Medication refills: You are responsible for knowing and keeping track of what medications you take and those you need refilled. The day before your appointment: write a list of all prescriptions that need to be refilled. The day of the appointment: give the list to the admitting nurse. Prescriptions will be written only during appointments. No prescriptions will be written on procedure days. If you forget a medication: it will not be "Called in", "Faxed", or "electronically sent". You will need to get another appointment to get these prescribed. No early refills. Do not call asking to have your prescription filled early. 4. Prescription Accuracy: You are responsible for carefully inspecting your prescriptions before leaving our office. Have the discharge nurse carefully go over each prescription with you, before taking them home. Make sure that your name is accurately spelled, that your address is correct. Check the name and dose of your medication to make sure it is accurate. Check the number of pills, and the written instructions to  make sure they are clear and accurate. Make sure that you are given enough medication to last until your next medication refill appointment. 5. Taking Medication: Take medication as prescribed. When it comes to controlled substances, taking less pills or less frequently than prescribed is permitted and encouraged. Never take more pills than instructed. Never take medication more frequently than prescribed.  6. Inform other Doctors: Always inform, all of your healthcare providers, of all the medications you take. 7. Pain Medication from other Providers: You are not allowed to accept any additional pain medication from any other Doctor or Healthcare provider. There are two exceptions to this rule. (see below) In the event that you require additional pain medication, you are responsible for notifying us, as stated below. 8. Medication Agreement: You are responsible for carefully reading and following our Medication Agreement. This must be signed before receiving any prescriptions from our practice. Safely store a copy of your signed Agreement. Violations to the Agreement will result in no further prescriptions. (Additional copies of our Medication Agreement are available upon request.) 9. Laws, Rules, & Regulations: All patients are expected to follow all Federal and State Laws, Statutes, Rules, & Regulations. Ignorance of the Laws does not constitute a valid excuse.  10. Illegal drugs and Controlled Substances: The use of illegal substances (including, but not limited to marijuana and its derivatives) and/or the illegal use of any controlled substances is strictly prohibited. Violation of this rule may result in the immediate and permanent discontinuation of any and all prescriptions being written by our practice. The use of any illegal substances is prohibited. 11. Adopted CDC guidelines & recommendations: Target dosing levels will be at or   below 60 MME/day. Use of benzodiazepines** is not  recommended.  Exceptions: There are only two exceptions to the rule of not receiving pain medications from other Healthcare Providers. 1. Exception #1 (Emergencies): In the event of an emergency (i.e.: accident requiring emergency care), you are allowed to receive additional pain medication. However, you are responsible for: As soon as you are able, call our office [phone], at any time of the day or night, and leave a message stating your name, the date and nature of the emergency, and the name and dose of the medication prescribed. In the event that your call is answered by a member of our staff, make sure to document and save the date, time, and the name of the person that took your information.  2. Exception #2 (Planned Surgery): In the event that you are scheduled by another doctor or dentist to have any type of surgery or procedure, you are allowed (for a period no longer than 30 days), to receive additional pain medication, for the acute post-op pain. However, in this case, you are responsible for picking up a copy of our "Post-op Pain Management for Surgeons" handout, and giving it to your surgeon or dentist. This document is available at our office, and does not require an appointment to obtain it. Simply go to our office during business hours (Monday-Thursday from 8:00 AM to 4:00 PM) (Friday 8:00 AM to 12:00 Noon) or if you have a scheduled appointment with us, prior to your surgery, and ask for it by name. In addition, you will need to provide us with your name, name of your surgeon, type of surgery, and date of procedure or surgery.  *Opioid medications include: morphine, codeine, oxycodone, oxymorphone, hydrocodone, hydromorphone, meperidine, tramadol, tapentadol, buprenorphine, fentanyl, methadone. **Benzodiazepine medications include: diazepam (Valium), alprazolam (Xanax), clonazepam (Klonopine), lorazepam (Ativan), clorazepate (Tranxene), chlordiazepoxide (Librium), estazolam (Prosom),  oxazepam (Serax), temazepam (Restoril), triazolam (Halcion) (Last updated: 12/09/2017) ____________________________________________________________________________________________   ____________________________________________________________________________________________  Medication Recommendations and Reminders  Applies to: All patients receiving prescriptions (written and/or electronic).  Medication Rules & Regulations: These rules and regulations exist for your safety and that of others. They are not flexible and neither are we. Dismissing or ignoring them will be considered "non-compliance" with medication therapy, resulting in complete and irreversible termination of such therapy. (See document titled "Medication Rules" for more details.) In all conscience, because of safety reasons, we cannot continue providing a therapy where the patient does not follow instructions.  Pharmacy of record:   Definition: This is the pharmacy where your electronic prescriptions will be sent.   We do not endorse any particular pharmacy, however, we have experienced problems with [facility] not securing enough medication supply for the community.  We do not restrict you in your choice of pharmacy. However, once we write for your prescriptions, we will NOT be re-sending more prescriptions to fix restricted supply problems created by your pharmacy, or your insurance.   The pharmacy listed in the electronic medical record should be the one where you want electronic prescriptions to be sent.  If you choose to change pharmacy, simply notify our nursing staff.  Recommendations:  Keep all of your pain medications in a safe place, under lock and key, even if you live alone. We will NOT replace lost, stolen, or damaged medication.  After you fill your prescription, take 1 week's worth of pills and put them away in a safe place. You should keep a separate, properly labeled bottle for this purpose. The remainder    should be kept in the original bottle. Use this as your primary supply, until it runs out. Once it's gone, then you know that you have 1 week's worth of medicine, and it is time to come in for a prescription refill. If you do this correctly, it is unlikely that you will ever run out of medicine.  To make sure that the above recommendation works, it is very important that you make sure your medication refill appointments are scheduled at least 1 week before you run out of medicine. To do this in an effective manner, make sure that you do not leave the office without scheduling your next medication management appointment. Always ask the nursing staff to show you in your prescription , when your medication will be running out. Then arrange for the receptionist to get you a return appointment, at least 7 days before you run out of medicine. Do not wait until you have 1 or 2 pills left, to come in. This is very poor planning and does not take into consideration that we may need to cancel appointments due to bad weather, sickness, or emergencies affecting our staff.  DO NOT ACCEPT A "Partial Fill": If for any reason your pharmacy does not have enough pills/tablets to completely fill or refill your prescription, do not allow for a "partial fill". The law allows the pharmacy to complete that prescription within 72 hours, without requiring a new prescription. If they do not fill the rest of your prescription within those 72 hours, you will need a separate prescription to fill the remaining amount, which we will NOT provide. If the reason for the partial fill is your insurance, you will need to talk to the pharmacist about payment alternatives for the remaining tablets, but again, DO NOT ACCEPT A PARTIAL FILL, unless you can trust your pharmacist to obtain the remainder of the pills within 72 hours.  Prescription refills and/or changes in medication(s):   Prescription refills, and/or changes in dose or medication,  will be conducted only during scheduled medication management appointments. (Applies to both, written and electronic prescriptions.)  No refills on procedure days. No medication will be changed or started on procedure days. No changes, adjustments, and/or refills will be conducted on a procedure day. Doing so will interfere with the diagnostic portion of the procedure.  No phone refills. No medications will be "called into the pharmacy".  No Fax refills.  No weekend refills.  No Holliday refills.  No after hours refills.  Remember:  Business hours are:  Monday to Thursday 8:00 AM to 4:00 PM Provider's Schedule: Khalie Wince, MD - Appointments are:  Medication management: Monday and Wednesday 8:00 AM to 4:00 PM Procedure day: Tuesday and Thursday 7:30 AM to 4:00 PM Bilal Lateef, MD - Appointments are:  Medication management: Tuesday and Thursday 8:00 AM to 4:00 PM Procedure day: Monday and Wednesday 7:30 AM to 4:00 PM (Last update: 05/01/2020) ____________________________________________________________________________________________   ____________________________________________________________________________________________  CBD (cannabidiol) WARNING  Applicable to: All individuals currently taking or considering taking CBD (cannabidiol) and, more important, all patients taking opioid analgesic controlled substances (pain medication). (Example: oxycodone; oxymorphone; hydrocodone; hydromorphone; morphine; methadone; tramadol; tapentadol; fentanyl; buprenorphine; butorphanol; dextromethorphan; meperidine; codeine; etc.)  Legal status: CBD remains a Schedule I drug prohibited for any use. CBD is illegal with one exception. In the [place], CBD has a limited Food and Drug Administration (FDA) approval for the treatment of two specific types of epilepsy disorders. Only one CBD product has been approved by the   FDA for this purpose: "Epidiolex". FDA is aware that some  companies are marketing products containing cannabis and cannabis-derived compounds in ways that violate the Federal Food, Drug and Cosmetic Act (FD&C Act) and that may put the health and safety of consumers at risk. The FDA, a Federal agency, has not enforced the CBD status since 2018.   Legality: Some manufacturers ship CBD products nationally, which is illegal. Often such products are sold online and are therefore available throughout the country. CBD is openly sold in head shops and health food stores in some states where such sales have not been explicitly legalized. Selling unapproved products with unsubstantiated therapeutic claims is not only a violation of the law, but also can put patients at risk, as these products have not been proven to be safe or effective. Federal illegality makes it difficult to conduct research on CBD.  Reference: "FDA Regulation of Cannabis and Cannabis-Derived Products, Including Cannabidiol (CBD)" - https://www.fda.gov/news-events/public-health-focus/fda-regulation-cannabis-and-cannabis-derived-products-including-cannabidiol-cbd  Warning: CBD is not FDA approved and has not undergo the same manufacturing controls as prescription drugs.  This means that the purity and safety of available CBD may be questionable. Most of the time, despite manufacturer's claims, it is contaminated with THC (delta-9-tetrahydrocannabinol - the chemical in marijuana responsible for the "HIGH").  When this is the case, the THC contaminant will trigger a positive urine drug screen (UDS) test for Marijuana (carboxy-THC). Because a positive UDS for any illicit substance is a violation of our medication agreement, your opioid analgesics (pain medicine) may be permanently discontinued.  MORE ABOUT CBD  General Information: CBD  is a derivative of the Marijuana (cannabis sativa) plant discovered in 1940. It is one of the 113 identified substances found in Marijuana. It accounts for up to 40% of the  plant's extract. As of 2018, preliminary clinical studies on CBD included research for the treatment of anxiety, movement disorders, and pain. CBD is available and consumed in multiple forms, including inhalation of smoke or vapor, as an aerosol spray, and by mouth. It may be supplied as an oil containing CBD, capsules, dried cannabis, or as a liquid solution. CBD is thought not to be as psychoactive as THC (delta-9-tetrahydrocannabinol - the chemical in marijuana responsible for the "HIGH"). Studies suggest that CBD may interact with different biological target receptors in the body, including cannabinoid and other neurotransmitter receptors. As of 2018 the mechanism of action for its biological effects has not been determined.  Side-effects  Adverse reactions: Dry mouth, diarrhea, decreased appetite, fatigue, drowsiness, malaise, weakness, sleep disturbances, and others.  Drug interactions: CBC may interact with other medications such as blood-thinners. (Last update: 05/18/2020) ____________________________________________________________________________________________    

## 2020-05-27 NOTE — Progress Notes (Signed)
PROVIDER NOTE: Information contained herein reflects review and annotations entered in association with encounter. Interpretation of such information and data should be left to medically-trained personnel. Information provided to patient can be located elsewhere in the medical record under "Patient Instructions". Document created using STT-dictation technology, any transcriptional errors that may result from process are unintentional.    Patient: Elizabeth [name]  Service Category: E/M  Provider: Gaspar Cola, MD  DOB: 1960-11-30  DOS: 05/27/2020  Specialty: Interventional Pain Management  MRN: [id number]  Setting: Ambulatory outpatient  PCP: [name], NP  Type: Established Patient    Referring Provider: [name], NP  Location: Office  Delivery: Face-to-face     HPI  Reason for encounter: [name], a 59 y.o. year old female, is here today for evaluation and management of her Chronic pain syndrome [G89.4]. [name]'s primary complain today is Back Pain (low) Last encounter: Practice (03/28/2020). My last encounter with her was on 02/28/2020. Pertinent problems: [name] has Chronic low back pain; Chronic pain syndrome; Complex regional pain syndrome I of upper limb (Bilateral); Complex regional pain syndrome I of lower limb (Bilateral); Presence of spinal cord stimulator; Chronic lower extremity pain (Bilateral); Lumbar facet syndrome (Bilateral); Lumbar spondylosis; and Chronic upper extremity pain (Bilateral) on their pertinent problem list. Pain Assessment: Severity of Chronic pain is reported as a 3 /10. Location: Back Lower/ . Onset: More than a month ago. Quality: Constant, Aching, Burning. Timing: Constant. Modifying factor(s): medication. Vitals:  height is '4\' 9"'$  (1.448 m) and weight is 120 lb (54.4 kg). Her oral temperature is 98.6 F (37 C). Her blood pressure is 125/70 and her pulse is 74. Her respiration is 18 and oxygen saturation is 98%.    The  patient indicates doing well with the current medication regimen. No adverse reactions or side effects reported to the medications.  Unfortunately, there is a shortage of oxycodone. She is currently taking oxycodone IR 5 mg, 1 tablet p.o. every 6 hours (20 mg/day of oxycodone) (30 MME).  Due to national shortage of oxycodone, we will be switching her to MS Contin 15 mg p.o. twice daily.  I will see her back before 06/26/2020 at which time I will review to see how she has done with the morphine ER and I will get an updated UDS.  She has not required any procedures since 2016.  When she returns, if she is doing well on the morphine ER, I will provide her with prescriptions to last for 90 days.  Pharmacotherapy Assessment   Analgesic: Oxycodone IR 5 mg, 1 tab PO q 6 hrs (20 mg/day of oxycodone) MME/day:30 mg/day.   Monitoring:  PMP: PDMP reviewed during this encounter.       Pharmacotherapy: No side-effects or adverse reactions reported. Compliance: No problems identified. Effectiveness: Clinically acceptable.  No notes on file  UDS:  Summary  Date Value Ref Range Status  03/14/2020 Note  Final    Comment:    ==================================================================== [employer] [facility] (MW) ==================================================================== Test                             Result       Flag       Units Drug Present and Declared for Prescription Verification   Oxycodone                      [phone]  EXPECTED   ng/mg creat   Oxymorphone                    1682         EXPECTED   ng/mg creat   Noroxycodone                   5527         EXPECTED   ng/mg creat   Noroxymorphone                 517          EXPECTED   ng/mg creat    Sources of oxycodone are scheduled prescription medications.    Oxymorphone, noroxycodone, and noroxymorphone are expected    metabolites of oxycodone. Oxymorphone is also available as a    scheduled prescription  medication. ==================================================================== Test                      Result    Flag   Units      Ref Range   Creatinine              122              mg/dL      >=20 ==================================================================== Declared Medications:  The flagging and interpretation on this report are based on the  following declared medications.  Unexpected results may arise from  inaccuracies in the declared medications.  **Note: The testing scope of this panel includes these medications:  Oxycodone (Roxicodone)  **Note: The testing scope of this panel does not include the  following reported medications:  Albuterol (Ventolin HFA)  Albuterol (Duoneb)  Atorvastatin (Lipitor)  Carvedilol (Coreg)  Formoterol (Dulera)  Furosemide (Lasix)  Ipratropium (Duoneb)  Mometasone (Dulera)  Omeprazole (Prilosec)  Polyethylene Glycol (MiraLAX)  Potassium (Klor-Con)  Ramelteon (Rozerem)  Spironolactone (Aldactone) ==================================================================== For clinical consultation, please call [phone]. ====================================================================      ROS  Constitutional: Denies any fever or chills Gastrointestinal: No reported hemesis, hematochezia, vomiting, or acute GI distress Musculoskeletal: Denies any acute onset joint swelling, redness, loss of ROM, or weakness Neurological: No reported episodes of acute onset apraxia, aphasia, dysarthria, agnosia, amnesia, paralysis, loss of coordination, or loss of consciousness  Medication Review  albuterol, atorvastatin, carvedilol, furosemide, ipratropium-albuterol, mometasone-formoterol, omeprazole, oxyCODONE, polyethylene glycol, potassium chloride SA, ramelteon, and spironolactone  History Review  Allergy: [name] is allergic to lisinopril. Drug: [name]  reports no history of drug use. Alcohol:  reports no history of alcohol  use. Tobacco:  reports that she quit smoking about 2 years ago. Her smoking use included cigarettes. She has a 40.00 pack-year smoking history. She has never used smokeless tobacco. Social: [name]  reports that she quit smoking about 2 years ago. Her smoking use included cigarettes. She has a 40.00 pack-year smoking history. She has never used smokeless tobacco. She reports that she does not drink alcohol and does not use drugs. Medical:  has a past medical history of Anxiety, Arm pain (07/25/2015), CHF (congestive heart failure) ([place]), Congestive heart failure ([place]) (07/25/2015), COPD (chronic obstructive pulmonary disease) ([place]), Depression, Hypertension, Lower extremity pain (07/25/2015), Reflux, RSD (reflex sympathetic dystrophy), Spinal cord stimulator status, Vitamin B12 deficiency, and Vitamin D deficiency. Surgical: [name]  has a past surgical history that includes Spinal cord stimulator implant. Family: family history includes Cancer in her father; Cirrhosis in her mother.  Laboratory Chemistry Profile   Renal Lab Results  Component Value Date   BUN 25 (H) 08/18/2018   CREATININE 0.76 08/18/2018   BCR 17 08/30/2017   GFRAA >60 08/18/2018   GFRNONAA >60 08/18/2018     Hepatic Lab Results  Component Value Date   AST 22 08/14/2018   ALT 19 08/14/2018   ALBUMIN 4.1 08/14/2018   ALKPHOS 121 08/14/2018     Electrolytes Lab Results  Component Value Date   NA 140 08/18/2018   K 4.1 08/18/2018   CL 103 08/18/2018   CALCIUM 8.7 (L) 08/18/2018   MG 2.6 (H) 04/18/2018   PHOS 4.1 04/16/2018     Bone Lab Results  Component Value Date   VD25OH 25.3 (L) 03/18/2016   VD125OH2TOT 76.2 03/18/2016   25OHVITD1 37 08/30/2017   25OHVITD2 1.0 08/30/2017   25OHVITD3 36 08/30/2017     Inflammation (CRP: Acute Phase) (ESR: Chronic Phase) Lab Results  Component Value Date   CRP 4.4 08/30/2017   ESRSEDRATE 30 08/30/2017       Note: Above Lab results reviewed.  Recent  Imaging Review  CT CHEST LUNG CANCER SCREENING LOW DOSE WO CONTRAST CLINICAL DATA:  59 year old female former smoker (quit 3 years ago) with 40 pack-year history of smoking. Lung cancer screening examination.  EXAM: CT CHEST WITHOUT CONTRAST LOW-DOSE FOR LUNG CANCER SCREENING  TECHNIQUE: Multidetector CT imaging of the chest was performed following the standard protocol without IV contrast.  COMPARISON:  Low-dose lung cancer screening chest CT 06/21/2018.  FINDINGS: Cardiovascular: Heart size is normal. There is no significant pericardial fluid, thickening or pericardial calcification. There is aortic atherosclerosis, as well as atherosclerosis of the great vessels of the mediastinum and the coronary arteries, including calcified atherosclerotic plaque in the left anterior descending, left circumflex and right coronary arteries.  Mediastinum/Nodes: No pathologically enlarged mediastinal or hilar lymph nodes. Please note that accurate exclusion of hilar adenopathy is limited on noncontrast CT scans. Esophagus is unremarkable in appearance. No axillary lymphadenopathy.  Lungs/Pleura: Multiple small pulmonary nodules are again noted throughout the lungs bilaterally, largest of which is in the medial aspect of the left upper lobe (axial image 62 of series 3), with a volume derived mean diameter of only 3.7 mm. No larger more suspicious appearing pulmonary nodules or masses are noted. No acute consolidative airspace disease. No pleural effusions. Mild diffuse bronchial wall thickening with mild centrilobular and paraseptal emphysema.  Upper Abdomen: Aortic atherosclerosis.  Musculoskeletal: There are no aggressive appearing lytic or blastic lesions noted in the visualized portions of the skeleton. Spinal cord stimulator incidentally noted.  IMPRESSION: 1. Lung-RADS 2S, benign appearance or behavior. Continue annual screening with low-dose chest CT without contrast in 12  months. 2. The "S" modifier above refers to potentially clinically significant non lung cancer related findings. Specifically, there is aortic atherosclerosis, in addition to 3 vessel coronary artery disease. Please note that although the presence of coronary artery calcium documents the presence of coronary artery disease, the severity of this disease and any potential stenosis cannot be assessed on this non-gated CT examination. Assessment for potential risk factor modification, dietary therapy or pharmacologic therapy may be warranted, if clinically indicated. 3. Mild diffuse bronchial wall thickening with mild centrilobular and paraseptal emphysema; imaging findings suggestive of underlying COPD.  Aortic Atherosclerosis (ICD10-I70.0) and Emphysema (ICD10-J43.9).  Electronically Signed   By: Vinnie Langton M.D.   On: 11/07/2019 09:09 Note: Reviewed        Physical Exam  General appearance: Well nourished, well developed, and well hydrated. In no  apparent acute distress Mental status: Alert, oriented x 3 (person, place, & time)       Respiratory: No evidence of acute respiratory distress Eyes: PERLA Vitals: BP 125/70   Pulse 74   Temp 98.6 F (37 C) (Oral)   Resp 18   Ht '4\' 9"'$  (1.448 m)   Wt 120 lb (54.4 kg)   SpO2 98%   BMI 25.97 kg/m  BMI: Estimated body mass index is 25.97 kg/m as calculated from the following:   Height as of this encounter: '4\' 9"'$  (1.448 m).   Weight as of this encounter: 120 lb (54.4 kg). Ideal: Patient must be at least 60 in tall to calculate ideal body weight  Assessment   Status Diagnosis  Controlled Controlled Controlled 1. Chronic pain syndrome   2. Complex regional pain syndrome type 1 of both lower extremities   3. Complex regional pain syndrome type 1 of both upper extremities   4. Chronic lower extremity pain (Bilateral)   5. Presence of spinal cord stimulator   6. Pharmacologic therapy   7. Uncomplicated opioid dependence ([place])       Updated Problems: No problems updated.  Plan of Care  Problem-specific:  No problem-specific Assessment & Plan notes found for this encounter.  [name] has a current medication list which includes the following long-term medication(s): albuterol, atorvastatin, carvedilol, furosemide, ipratropium-albuterol, mometasone-formoterol, omeprazole, potassium chloride sa, ramelteon, and spironolactone.  Pharmacotherapy (Medications Ordered): No orders of the defined types were placed in this encounter.  Orders:  No orders of the defined types were placed in this encounter.  Follow-up plan:   No follow-ups on file.      Interventional therapies:  Considering:   Diagnostic bilateral lumbar facet block  Possible bilateral lumbar facet RFA  Possible bilateral lumbar sympathetic RFA    Palliative PRN treatment(s):   Palliative right vs left SGBs  Palliative  right vs left LSBs     Recent Visits No visits were found meeting these conditions. Showing recent visits within past 90 days and meeting all other requirements Today's Visits Date Type Provider Dept  05/27/20 Office Visit Milinda Pointer, MD [facility]  Showing today's visits and meeting all other requirements Future Appointments No visits were found meeting these conditions. Showing future appointments within next 90 days and meeting all other requirements  I discussed the assessment and treatment plan with the patient. The patient was provided an opportunity to ask questions and all were answered. The patient agreed with the plan and demonstrated an understanding of the instructions.  Patient advised to call back or seek an in-person evaluation if the symptoms or condition worsens.  Duration of encounter: 30 minutes.  Note by: Gaspar Cola, MD Date: 05/27/2020; Time: 8:15 AM

## 2020-05-27 NOTE — Telephone Encounter (Signed)
Spoke with  Viviann Spare at [facility].  States he does have the MS contin in stock.  States he told the patient that she would need a prior authorization.  She asked for a cash price and he gave it to her.  Viviann Spare states the patient said that she would just get her doctor to change it back to oxycodone. Will submit PA for MS contin. Patient notified.

## 2020-05-27 NOTE — Progress Notes (Signed)
Nursing Pain Medication Assessment:  Safety precautions to be maintained throughout the outpatient stay will include: orient to surroundings, keep bed in low position, maintain call bell within reach at all times, provide assistance with transfer out of bed and ambulation.  Medication Inspection Compliance: Pill count conducted under aseptic conditions, in front of the patient. Neither the pills nor the bottle was removed from the patient's sight at any time. Once count was completed pills were immediately returned to the patient in their original bottle.  Medication: Oxycodone IR Pill/Patch Count: 0 of 120 pills remain Pill/Patch Appearance: none to see Bottle Appearance: Standard pharmacy container. Clearly labeled. Filled Date: 07 / 17 / 2021 Last Medication intake:  Today

## 2020-06-19 ENCOUNTER — Ambulatory Visit: Payer: Medicaid Other | Attending: Pain Medicine | Admitting: Pain Medicine

## 2020-06-19 ENCOUNTER — Encounter: Payer: Self-pay | Admitting: Pain Medicine

## 2020-06-19 ENCOUNTER — Other Ambulatory Visit: Payer: Self-pay

## 2020-06-19 VITALS — BP 102/70 | HR 72 | Temp 97.5°F | Ht <= 58 in | Wt 120.0 lb

## 2020-06-19 DIAGNOSIS — G90513 Complex regional pain syndrome I of upper limb, bilateral: Secondary | ICD-10-CM | POA: Diagnosis present

## 2020-06-19 DIAGNOSIS — M79604 Pain in right leg: Secondary | ICD-10-CM | POA: Insufficient documentation

## 2020-06-19 DIAGNOSIS — G894 Chronic pain syndrome: Secondary | ICD-10-CM

## 2020-06-19 DIAGNOSIS — F1120 Opioid dependence, uncomplicated: Secondary | ICD-10-CM | POA: Insufficient documentation

## 2020-06-19 DIAGNOSIS — G8929 Other chronic pain: Secondary | ICD-10-CM | POA: Insufficient documentation

## 2020-06-19 DIAGNOSIS — Z969 Presence of functional implant, unspecified: Secondary | ICD-10-CM | POA: Insufficient documentation

## 2020-06-19 DIAGNOSIS — G90523 Complex regional pain syndrome I of lower limb, bilateral: Secondary | ICD-10-CM | POA: Insufficient documentation

## 2020-06-19 DIAGNOSIS — M79605 Pain in left leg: Secondary | ICD-10-CM | POA: Diagnosis present

## 2020-06-19 DIAGNOSIS — Z79899 Other long term (current) drug therapy: Secondary | ICD-10-CM | POA: Insufficient documentation

## 2020-06-19 MED ORDER — HYDROCODONE-ACETAMINOPHEN 5-325 MG PO TABS: 1.0000 | ORAL_TABLET | Freq: Four times a day (QID) | ORAL | 0 refills | Status: DC | PRN

## 2020-06-19 NOTE — Progress Notes (Signed)
PROVIDER NOTE: Information contained herein reflects review and annotations entered in association with encounter. Interpretation of such information and data should be left to medically-trained personnel. Information provided to patient can be located elsewhere in the medical record under "Patient Instructions". Document created using STT-dictation technology, any transcriptional errors that may result from process are unintentional.    Patient: Elizabeth [name]  Service Category: E/M  Provider: Gaspar Cola, MD  DOB: [birth year]  DOS: 06/19/2020  Specialty: Interventional Pain Management  MRN: [id number]  Setting: Ambulatory outpatient  PCP: [name], NP  Type: Established Patient    Referring Provider: [name], NP  Location: Office  Delivery: Face-to-face     HPI  Reason for encounter: Elizabeth [name], a 59 y.o. year old female, is here today for evaluation and management of her Chronic pain syndrome [G89.4]. Elizabeth [name]'s primary complain today is Back Pain Last encounter: Practice (05/27/2020). My last encounter with her was on 05/27/2020. Pertinent problems: Elizabeth [name] has Chronic low back pain; Chronic pain syndrome; Complex regional pain syndrome I of upper limb (Bilateral); Complex regional pain syndrome I of lower limb (Bilateral); Presence of spinal cord stimulator; Chronic lower extremity pain (Bilateral); Lumbar facet syndrome (Bilateral); Lumbar spondylosis; and Chronic upper extremity pain (Bilateral) on their pertinent problem list. Pain Assessment: Severity of Chronic pain is reported as a 5 /10. Location: Back (right arm) Right, Left/pain radiaties down right arm. Onset: More than a month ago. Quality: Burning, Aching, Throbbing, Nagging, Sharp. Timing: Constant. Modifying factor(s): Meds and rest. Vitals:  height is $RemoveB'4\' 9"'BAndkEBR$  (1.448 m) and weight is 120 lb (54.4 kg). Her temperature is 97.5 F (36.4 C) (abnormal). Her blood pressure is 102/70 and her pulse  is 72. Her oxygen saturation is 99%.   The patient comes into the clinic today for follow-up medication management appointment.  The patient indicates doing well with the current medication regimen. No adverse reactions or side effects reported to the medications. On the patient's last encounter on 05/27/2020 her oxycodone IR 5 mg, 1 tablet p.o. every 6 hours (20 mg/day of oxycodone) (30 MME) was switched to morphine ER 15 mg every 12 hours (30 mg/day of morphine) (30 MME/day) due to the oxycodone national shortage.  She comes into the clinics today for evaluation.  Unfortunately, she was unable to tolerate the morphine and therefore today we had to switch her to the hydrocodone.  Will follow up with her but once she is established on the hydrocodone, we will need to do a UDS to reflect what she currently takes.  This patient has been seen since before 07/25/2015 due to CRPS of the upper and lower extremities.  Occasionally she has been admitted to the hospital with epidural infusions to lower the severe swelling of the extremities.  This has worked well in the past, however she has had no interventional therapies since 2016.  Pharmacotherapy Assessment   Analgesic: Oxycodone IR 5 mg, 1 tab PO q 6 hrs (20 mg/day of oxycodone) MME/day:30 mg/day.   Monitoring: [place] PMP: PDMP reviewed during this encounter.       Pharmacotherapy: No side-effects or adverse reactions reported. Compliance: No problems identified. Effectiveness: Clinically acceptable.  Chauncey Fischer, RN  06/19/2020 12:28 PM  Sign when Signing Visit Nursing Pain Medication Assessment:  Safety precautions to be maintained throughout the outpatient stay will include: orient to surroundings, keep bed in low position, maintain call bell within reach at all times, provide assistance with transfer out of bed  and ambulation.  Medication Inspection Compliance: Pill count conducted under aseptic conditions, in front of the patient. Neither the  pills nor the bottle was removed from the patient's sight at any time. Once count was completed pills were immediately returned to the patient in their original bottle.  Medication: Morphine ER (MSContin) Pill/Patch Count: 15 of 60 pills remain Pill/Patch Appearance: Markings consistent with prescribed medication Bottle Appearance: Standard pharmacy container. Clearly labeled. Filled Date: 8 / 78 / 21 Last Medication intake:  TodaySafety precautions to be maintained throughout the outpatient stay will include: orient to surroundings, keep bed in low position, maintain call bell within reach at all times, provide assistance with transfer out of bed and ambulation.     UDS:  Summary  Date Value Ref Range Status  03/14/2020 Note  Final    Comment:    ==================================================================== [employer] 13 (MW) ==================================================================== Test                             Result       Flag       Units Drug Present and Declared for Prescription Verification   Oxycodone                      3915         EXPECTED   ng/mg creat   Oxymorphone                    1682         EXPECTED   ng/mg creat   Noroxycodone                   5527         EXPECTED   ng/mg creat   Noroxymorphone                 517          EXPECTED   ng/mg creat    Sources of oxycodone are scheduled prescription medications.    Oxymorphone, noroxycodone, and noroxymorphone are expected    metabolites of oxycodone. Oxymorphone is also available as a    scheduled prescription medication. ==================================================================== Test                      Result    Flag   Units      Ref Range   Creatinine              122              mg/dL      >=16 ==================================================================== Declared Medications:  The flagging and interpretation on this report are based on the  following declared  medications.  Unexpected results may arise from  inaccuracies in the declared medications.  **Note: The testing scope of this panel includes these medications:  Oxycodone (Roxicodone)  **Note: The testing scope of this panel does not include the  following reported medications:  Albuterol (Ventolin HFA)  Albuterol (Duoneb)  Atorvastatin (Lipitor)  Carvedilol (Coreg)  Formoterol (Dulera)  Furosemide (Lasix)  Ipratropium (Duoneb)  Mometasone (Dulera)  Omeprazole (Prilosec)  Polyethylene Glycol (MiraLAX)  Potassium (Klor-Con)  Ramelteon (Rozerem)  Spironolactone (Aldactone) ==================================================================== For clinical consultation, please call [phone]. ====================================================================      ROS  Constitutional: Denies any fever or chills Gastrointestinal: No reported hemesis, hematochezia, vomiting, or acute GI distress Musculoskeletal: Denies any acute onset joint swelling,  redness, loss of ROM, or weakness Neurological: No reported episodes of acute onset apraxia, aphasia, dysarthria, agnosia, amnesia, paralysis, loss of coordination, or loss of consciousness  Medication Review  HYDROcodone-acetaminophen, albuterol, atorvastatin, carvedilol, furosemide, ipratropium-albuterol, mometasone-formoterol, morphine, omeprazole, oxyCODONE, polyethylene glycol, potassium chloride SA, ramelteon, and spironolactone  History Review  Allergy: Elizabeth [name] is allergic to lisinopril. Drug: Elizabeth [name]  reports no history of drug use. Alcohol:  reports no history of alcohol use. Tobacco:  reports that she quit smoking about 2 years ago. Her smoking use included cigarettes. She has a 40.00 pack-year smoking history. She has never used smokeless tobacco. Social: Elizabeth [name]  reports that she quit smoking about 2 years ago. Her smoking use included cigarettes. She has a 40.00 pack-year smoking history. She has never used  smokeless tobacco. She reports that she does not drink alcohol and does not use drugs. Medical:  has a past medical history of Anxiety, Arm pain (07/25/2015), CHF (congestive heart failure) ([place]), Congestive heart failure ([place]) (07/25/2015), COPD (chronic obstructive pulmonary disease) ([place]), Depression, Hypertension, Lower extremity pain (07/25/2015), Reflux, RSD (reflex sympathetic dystrophy), Spinal cord stimulator status, Vitamin B12 deficiency, and Vitamin D deficiency. Surgical: Elizabeth [name]  has a past surgical history that includes Spinal cord stimulator implant. Family: family history includes Cancer in her father; Cirrhosis in her mother.  Laboratory Chemistry Profile   Renal Lab Results  Component Value Date   BUN 25 (H) 08/18/2018   CREATININE 0.76 08/18/2018   BCR 17 08/30/2017   GFRAA >60 08/18/2018   GFRNONAA >60 08/18/2018     Hepatic Lab Results  Component Value Date   AST 22 08/14/2018   ALT 19 08/14/2018   ALBUMIN 4.1 08/14/2018   ALKPHOS 121 08/14/2018     Electrolytes Lab Results  Component Value Date   NA 140 08/18/2018   K 4.1 08/18/2018   CL 103 08/18/2018   CALCIUM 8.7 (L) 08/18/2018   MG 2.6 (H) 04/18/2018   PHOS 4.1 04/16/2018     Bone Lab Results  Component Value Date   VD25OH 25.3 (L) 03/18/2016   VD125OH2TOT 76.2 03/18/2016   25OHVITD1 37 08/30/2017   25OHVITD2 1.0 08/30/2017   25OHVITD3 36 08/30/2017     Inflammation (CRP: Acute Phase) (ESR: Chronic Phase) Lab Results  Component Value Date   CRP 4.4 08/30/2017   ESRSEDRATE 30 08/30/2017       Note: Above Lab results reviewed.  Recent Imaging Review  CT CHEST LUNG CANCER SCREENING LOW DOSE WO CONTRAST CLINICAL DATA:  59 year old female former smoker (quit 3 years ago) with 40 pack-year history of smoking. Lung cancer screening examination.  EXAM: CT CHEST WITHOUT CONTRAST LOW-DOSE FOR LUNG CANCER SCREENING  TECHNIQUE: Multidetector CT imaging of the chest was performed  following the standard protocol without IV contrast.  COMPARISON:  Low-dose lung cancer screening chest CT 06/21/2018.  FINDINGS: Cardiovascular: Heart size is normal. There is no significant pericardial fluid, thickening or pericardial calcification. There is aortic atherosclerosis, as well as atherosclerosis of the great vessels of the mediastinum and the coronary arteries, including calcified atherosclerotic plaque in the left anterior descending, left circumflex and right coronary arteries.  Mediastinum/Nodes: No pathologically enlarged mediastinal or hilar lymph nodes. Please note that accurate exclusion of hilar adenopathy is limited on noncontrast CT scans. Esophagus is unremarkable in appearance. No axillary lymphadenopathy.  Lungs/Pleura: Multiple small pulmonary nodules are again noted throughout the lungs bilaterally, largest of which is in the medial aspect of the left upper lobe (axial  image 62 of series 3), with a volume derived mean diameter of only 3.7 mm. No larger more suspicious appearing pulmonary nodules or masses are noted. No acute consolidative airspace disease. No pleural effusions. Mild diffuse bronchial wall thickening with mild centrilobular and paraseptal emphysema.  Upper Abdomen: Aortic atherosclerosis.  Musculoskeletal: There are no aggressive appearing lytic or blastic lesions noted in the visualized portions of the skeleton. Spinal cord stimulator incidentally noted.  IMPRESSION: 1. Lung-RADS 2S, benign appearance or behavior. Continue annual screening with low-dose chest CT without contrast in 12 months. 2. The "S" modifier above refers to potentially clinically significant non lung cancer related findings. Specifically, there is aortic atherosclerosis, in addition to 3 vessel coronary artery disease. Please note that although the presence of coronary artery calcium documents the presence of coronary artery disease, the severity of this  disease and any potential stenosis cannot be assessed on this non-gated CT examination. Assessment for potential risk factor modification, dietary therapy or pharmacologic therapy may be warranted, if clinically indicated. 3. Mild diffuse bronchial wall thickening with mild centrilobular and paraseptal emphysema; imaging findings suggestive of underlying COPD.  Aortic Atherosclerosis (ICD10-I70.0) and Emphysema (ICD10-J43.9).  Electronically Signed   By: Vinnie Langton M.D.   On: 11/07/2019 09:09 Note: Reviewed        Physical Exam  General appearance: Well nourished, well developed, and well hydrated. In no apparent acute distress Mental status: Alert, oriented x 3 (person, place, & time)       Respiratory: No evidence of acute respiratory distress Eyes: PERLA Vitals: BP 102/70    Pulse 72    Temp (!) 97.5 F (36.4 C)    Ht $R'4\' 9"'FA$  (1.448 m)    Wt 120 lb (54.4 kg)    SpO2 99%    BMI 25.97 kg/m  BMI: Estimated body mass index is 25.97 kg/m as calculated from the following:   Height as of this encounter: $RemoveBeforeD'4\' 9"'OZiwfHeieqPDKG$  (1.448 m).   Weight as of this encounter: 120 lb (54.4 kg). Ideal: Patient must be at least 60 in tall to calculate ideal body weight  Assessment   Status Diagnosis  Controlled Controlled Controlled 1. Chronic pain syndrome   2. Complex regional pain syndrome type 1 of both lower extremities   3. Complex regional pain syndrome type 1 of both upper extremities   4. Chronic lower extremity pain (Bilateral)   5. Presence of spinal cord stimulator   6. Pharmacologic therapy   7. Uncomplicated opioid dependence ([place])      Updated Problems: No problems updated.  Plan of Care  Problem-specific:  No problem-specific Assessment & Plan notes found for this encounter.  Elizabeth [name] has a current medication list which includes the following long-term medication(s): albuterol, atorvastatin, carvedilol, furosemide, ipratropium-albuterol, morphine, omeprazole,  potassium chloride sa, ramelteon, spironolactone, and mometasone-formoterol.  Pharmacotherapy (Medications Ordered): Meds ordered this encounter  Medications   HYDROcodone-acetaminophen (NORCO/VICODIN) 5-325 MG tablet    Sig: Take 1 tablet by mouth every 6 (six) hours as needed for severe pain. Must last 30 days.    Dispense:  120 tablet    Refill:  0    Chronic Pain: STOP Act (Not applicable) Fill 1 day early if closed on refill date. Avoid benzodiazepines within 8 hours of opioids   HYDROcodone-acetaminophen (NORCO/VICODIN) 5-325 MG tablet    Sig: Take 1 tablet by mouth every 6 (six) hours as needed for severe pain. Must last 30 days.    Dispense:  120 tablet  Refill:  0    Chronic Pain: STOP Act (Not applicable) Fill 1 day early if closed on refill date. Avoid benzodiazepines within 8 hours of opioids   HYDROcodone-acetaminophen (NORCO/VICODIN) 5-325 MG tablet    Sig: Take 1 tablet by mouth every 6 (six) hours as needed for severe pain. Must last 30 days.    Dispense:  120 tablet    Refill:  0    Chronic Pain: STOP Act (Not applicable) Fill 1 day early if closed on refill date. Avoid benzodiazepines within 8 hours of opioids   Orders:  No orders of the defined types were placed in this encounter.  Follow-up plan:   Return in about 9 weeks (around 08/21/2020) for (20-min), (F2F), (Med Mgmt).      Interventional therapies:  Considering:   Diagnostic bilateral lumbar facet block  Possible bilateral lumbar facet RFA  Possible bilateral lumbar sympathetic RFA    Palliative PRN treatment(s):   Palliative right vs left SGBs  Palliative  right vs left LSBs      Recent Visits Date Type Provider Dept  06/19/20 Office Visit Milinda Pointer, MD [facility]  05/27/20 Office Visit Milinda Pointer, MD [facility]  Showing recent visits within past 90 days and meeting all other requirements Future Appointments Date Type Provider Dept  08/21/20 Appointment  Milinda Pointer, MD [facility]  Showing future appointments within next 90 days and meeting all other requirements  I discussed the assessment and treatment plan with the patient. The patient was provided an opportunity to ask questions and all were answered. The patient agreed with the plan and demonstrated an understanding of the instructions.  Patient advised to call back or seek an in-person evaluation if the symptoms or condition worsens.  Duration of encounter: 30 minutes.  Note by: Gaspar Cola, MD Date: 06/19/2020; Time: 5:30 AM

## 2020-06-19 NOTE — Patient Instructions (Signed)
____________________________________________________________________________________________  Drug Holidays (Slow)  What is a "Drug Holiday"? Drug Holiday: is the name given to the period of time during which a patient stops taking a medication(s) for the purpose of eliminating tolerance to the drug.  Benefits . Improved effectiveness of opioids. . Decreased opioid dose needed to achieve benefits. . Improved pain with lesser dose.  What is tolerance? Tolerance: is the progressive decreased in effectiveness of a drug due to its repetitive use. With repetitive use, the body gets use to the medication and as a consequence, it loses its effectiveness. This is a common problem seen with opioid pain medications. As a result, a larger dose of the drug is needed to achieve the same effect that used to be obtained with a smaller dose.  How long should a "Drug Holiday" last? You should stay off of the pain medicine for at least 14 consecutive days. (2 weeks)  Should I stop the medicine "cold turkey"? No. You should always coordinate with your Pain Specialist so that he/she can provide you with the correct medication dose to make the transition as smoothly as possible.  How do I stop the medicine? Slowly. You will be instructed to decrease the daily amount of pills that you take by one (1) pill every seven (7) days. This is called a "slow downward taper" of your dose. For example: if you normally take four (4) pills per day, you will be asked to drop this dose to three (3) pills per day for seven (7) days, then to two (2) pills per day for seven (7) days, then to one (1) per day for seven (7) days, and at the end of those last seven (7) days, this is when the "Drug Holiday" would start.   Will I have withdrawals? By doing a "slow downward taper" like this one, it is unlikely that you will experience any significant withdrawal symptoms. Typically, what triggers withdrawals is the sudden stop of a high  dose opioid therapy. Withdrawals can usually be avoided by slowly decreasing the dose over a prolonged period of time. If you do not follow these instructions and decide to stop your medication abruptly, withdrawals may be possible.  What are withdrawals? Withdrawals: refers to the wide range of symptoms that occur after stopping or dramatically reducing opiate drugs after heavy and prolonged use. Withdrawal symptoms do not occur to patients that use low dose opioids, or those who take the medication sporadically. Contrary to benzodiazepine (example: Valium, Xanax, etc.) or alcohol withdrawals ("Delirium Tremens"), opioid withdrawals are not lethal. Withdrawals are the physical manifestation of the body getting rid of the excess receptors.  Expected Symptoms Early symptoms of withdrawal may include: . Agitation . Anxiety . Muscle aches . Increased tearing . Insomnia . Runny nose . Sweating . Yawning  Late symptoms of withdrawal may include: . Abdominal cramping . Diarrhea . Dilated pupils . Goose bumps . Nausea . Vomiting  Will I experience withdrawals? Due to the slow nature of the taper, it is very unlikely that you will experience any.  What is a slow taper? Taper: refers to the gradual decrease in dose.  (Last update: 05/01/2020) ____________________________________________________________________________________________    ____________________________________________________________________________________________  Medication Rules  Purpose: To inform patients, and their family members, of our rules and regulations.  Applies to: All patients receiving prescriptions (written or electronic).  Pharmacy of record: Pharmacy where electronic prescriptions will be sent. If written prescriptions are taken to a different pharmacy, please inform the nursing staff. The pharmacy   listed in the electronic medical record should be the one where you would like electronic prescriptions  to be sent.  Electronic prescriptions: In compliance with the [place] Strengthen Opioid Misuse Prevention (STOP) Act of 2017 (Session Law 2017-74/H243), effective October 12, 2018, all controlled substances must be electronically prescribed. Calling prescriptions to the pharmacy will cease to exist.  Prescription refills: Only during scheduled appointments. Applies to all prescriptions.  NOTE: The following applies primarily to controlled substances (Opioid* Pain Medications).   Type of encounter (visit): For patients receiving controlled substances, face-to-face visits are required. (Not an option or up to the patient.)  Patient's responsibilities: 1. Pain Pills: Bring all pain pills to every appointment (except for procedure appointments). 2. Pill Bottles: Bring pills in original pharmacy bottle. Always bring the newest bottle. Bring bottle, even if empty. 3. Medication refills: You are responsible for knowing and keeping track of what medications you take and those you need refilled. The day before your appointment: write a list of all prescriptions that need to be refilled. The day of the appointment: give the list to the admitting nurse. Prescriptions will be written only during appointments. No prescriptions will be written on procedure days. If you forget a medication: it will not be "Called in", "Faxed", or "electronically sent". You will need to get another appointment to get these prescribed. No early refills. Do not call asking to have your prescription filled early. 4. Prescription Accuracy: You are responsible for carefully inspecting your prescriptions before leaving our office. Have the discharge nurse carefully go over each prescription with you, before taking them home. Make sure that your name is accurately spelled, that your address is correct. Check the name and dose of your medication to make sure it is accurate. Check the number of pills, and the written instructions to  make sure they are clear and accurate. Make sure that you are given enough medication to last until your next medication refill appointment. 5. Taking Medication: Take medication as prescribed. When it comes to controlled substances, taking less pills or less frequently than prescribed is permitted and encouraged. Never take more pills than instructed. Never take medication more frequently than prescribed.  6. Inform other Doctors: Always inform, all of your healthcare providers, of all the medications you take. 7. Pain Medication from other Providers: You are not allowed to accept any additional pain medication from any other Doctor or Healthcare provider. There are two exceptions to this rule. (see below) In the event that you require additional pain medication, you are responsible for notifying us, as stated below. 8. Medication Agreement: You are responsible for carefully reading and following our Medication Agreement. This must be signed before receiving any prescriptions from our practice. Safely store a copy of your signed Agreement. Violations to the Agreement will result in no further prescriptions. (Additional copies of our Medication Agreement are available upon request.) 9. Laws, Rules, & Regulations: All patients are expected to follow all Federal and State Laws, Statutes, Rules, & Regulations. Ignorance of the Laws does not constitute a valid excuse.  10. Illegal drugs and Controlled Substances: The use of illegal substances (including, but not limited to marijuana and its derivatives) and/or the illegal use of any controlled substances is strictly prohibited. Violation of this rule may result in the immediate and permanent discontinuation of any and all prescriptions being written by our practice. The use of any illegal substances is prohibited. 11. Adopted CDC guidelines & recommendations: Target dosing levels will be at or   below 60 MME/day. Use of benzodiazepines** is not  recommended.  Exceptions: There are only two exceptions to the rule of not receiving pain medications from other Healthcare Providers. 1. Exception #1 (Emergencies): In the event of an emergency (i.e.: accident requiring emergency care), you are allowed to receive additional pain medication. However, you are responsible for: As soon as you are able, call our office [phone], at any time of the day or night, and leave a message stating your name, the date and nature of the emergency, and the name and dose of the medication prescribed. In the event that your call is answered by a member of our staff, make sure to document and save the date, time, and the name of the person that took your information.  2. Exception #2 (Planned Surgery): In the event that you are scheduled by another doctor or dentist to have any type of surgery or procedure, you are allowed (for a period no longer than 30 days), to receive additional pain medication, for the acute post-op pain. However, in this case, you are responsible for picking up a copy of our "Post-op Pain Management for Surgeons" handout, and giving it to your surgeon or dentist. This document is available at our office, and does not require an appointment to obtain it. Simply go to our office during business hours (Monday-Thursday from 8:00 AM to 4:00 PM) (Friday 8:00 AM to 12:00 Noon) or if you have a scheduled appointment with us, prior to your surgery, and ask for it by name. In addition, you are responsible for: calling our office [phone], at any time of the day or night, and leaving a message stating your name, name of your surgeon, type of surgery, and date of procedure or surgery. Failure to comply with your responsibilities may result in termination of therapy involving the controlled substances.  *Opioid medications include: morphine, codeine, oxycodone, oxymorphone, hydrocodone, hydromorphone, meperidine, tramadol, tapentadol, buprenorphine,  fentanyl, methadone. **Benzodiazepine medications include: diazepam (Valium), alprazolam (Xanax), clonazepam (Klonopine), lorazepam (Ativan), clorazepate (Tranxene), chlordiazepoxide (Librium), estazolam (Prosom), oxazepam (Serax), temazepam (Restoril), triazolam (Halcion) (Last updated: 06/18/2020) ____________________________________________________________________________________________   ____________________________________________________________________________________________  Medication Recommendations and Reminders  Applies to: All patients receiving prescriptions (written and/or electronic).  Medication Rules & Regulations: These rules and regulations exist for your safety and that of others. They are not flexible and neither are we. Dismissing or ignoring them will be considered "non-compliance" with medication therapy, resulting in complete and irreversible termination of such therapy. (See document titled "Medication Rules" for more details.) In all conscience, because of safety reasons, we cannot continue providing a therapy where the patient does not follow instructions.  Pharmacy of record:   Definition: This is the pharmacy where your electronic prescriptions will be sent.   We do not endorse any particular pharmacy, however, we have experienced problems with [facility] not securing enough medication supply for the community.  We do not restrict you in your choice of pharmacy. However, once we write for your prescriptions, we will NOT be re-sending more prescriptions to fix restricted supply problems created by your pharmacy, or your insurance.   The pharmacy listed in the electronic medical record should be the one where you want electronic prescriptions to be sent.  If you choose to change pharmacy, simply notify our nursing staff.  Recommendations:  Keep all of your pain medications in a safe place, under lock and key, even if you live alone. We will NOT replace lost,  stolen, or damaged medication.  After   you fill your prescription, take 1 week's worth of pills and put them away in a safe place. You should keep a separate, properly labeled bottle for this purpose. The remainder should be kept in the original bottle. Use this as your primary supply, until it runs out. Once it's gone, then you know that you have 1 week's worth of medicine, and it is time to come in for a prescription refill. If you do this correctly, it is unlikely that you will ever run out of medicine.  To make sure that the above recommendation works, it is very important that you make sure your medication refill appointments are scheduled at least 1 week before you run out of medicine. To do this in an effective manner, make sure that you do not leave the office without scheduling your next medication management appointment. Always ask the nursing staff to show you in your prescription , when your medication will be running out. Then arrange for the receptionist to get you a return appointment, at least 7 days before you run out of medicine. Do not wait until you have 1 or 2 pills left, to come in. This is very poor planning and does not take into consideration that we may need to cancel appointments due to bad weather, sickness, or emergencies affecting our staff.  DO NOT ACCEPT A "Partial Fill": If for any reason your pharmacy does not have enough pills/tablets to completely fill or refill your prescription, do not allow for a "partial fill". The law allows the pharmacy to complete that prescription within 72 hours, without requiring a new prescription. If they do not fill the rest of your prescription within those 72 hours, you will need a separate prescription to fill the remaining amount, which we will NOT provide. If the reason for the partial fill is your insurance, you will need to talk to the pharmacist about payment alternatives for the remaining tablets, but again, DO NOT ACCEPT A PARTIAL FILL,  unless you can trust your pharmacist to obtain the remainder of the pills within 72 hours.  Prescription refills and/or changes in medication(s):   Prescription refills, and/or changes in dose or medication, will be conducted only during scheduled medication management appointments. (Applies to both, written and electronic prescriptions.)  No refills on procedure days. No medication will be changed or started on procedure days. No changes, adjustments, and/or refills will be conducted on a procedure day. Doing so will interfere with the diagnostic portion of the procedure.  No phone refills. No medications will be "called into the pharmacy".  No Fax refills.  No weekend refills.  No Holliday refills.  No after hours refills.  Remember:  Business hours are:  Monday to Thursday 8:00 AM to 4:00 PM Provider's Schedule: Taheerah Guldin, MD - Appointments are:  Medication management: Monday and Wednesday 8:00 AM to 4:00 PM Procedure day: Tuesday and Thursday 7:30 AM to 4:00 PM Bilal Lateef, MD - Appointments are:  Medication management: Tuesday and Thursday 8:00 AM to 4:00 PM Procedure day: Monday and Wednesday 7:30 AM to 4:00 PM (Last update: 05/01/2020) ____________________________________________________________________________________________   ____________________________________________________________________________________________  CBD (cannabidiol) WARNING  Applicable to: All individuals currently taking or considering taking CBD (cannabidiol) and, more important, all patients taking opioid analgesic controlled substances (pain medication). (Example: oxycodone; oxymorphone; hydrocodone; hydromorphone; morphine; methadone; tramadol; tapentadol; fentanyl; buprenorphine; butorphanol; dextromethorphan; meperidine; codeine; etc.)  Legal status: CBD remains a Schedule I drug prohibited for any use. CBD is illegal with one exception. In the   [place], CBD has a limited Food  and Drug Administration (FDA) approval for the treatment of two specific types of epilepsy disorders. Only one CBD product has been approved by the FDA for this purpose: "Epidiolex". FDA is aware that some companies are marketing products containing cannabis and cannabis-derived compounds in ways that violate the Federal Food, Drug and Cosmetic Act (FD&C Act) and that may put the health and safety of consumers at risk. The FDA, a Federal agency, has not enforced the CBD status since 2018.   Legality: Some manufacturers ship CBD products nationally, which is illegal. Often such products are sold online and are therefore available throughout the country. CBD is openly sold in head shops and health food stores in some states where such sales have not been explicitly legalized. Selling unapproved products with unsubstantiated therapeutic claims is not only a violation of the law, but also can put patients at risk, as these products have not been proven to be safe or effective. Federal illegality makes it difficult to conduct research on CBD.  Reference: "FDA Regulation of Cannabis and Cannabis-Derived Products, Including Cannabidiol (CBD)" - https://www.fda.gov/news-events/public-health-focus/fda-regulation-cannabis-and-cannabis-derived-products-including-cannabidiol-cbd  Warning: CBD is not FDA approved and has not undergo the same manufacturing controls as prescription drugs.  This means that the purity and safety of available CBD may be questionable. Most of the time, despite manufacturer's claims, it is contaminated with THC (delta-9-tetrahydrocannabinol - the chemical in marijuana responsible for the "HIGH").  When this is the case, the THC contaminant will trigger a positive urine drug screen (UDS) test for Marijuana (carboxy-THC). Because a positive UDS for any illicit substance is a violation of our medication agreement, your opioid analgesics (pain medicine) may be permanently discontinued.  MORE ABOUT  CBD  General Information: CBD  is a derivative of the Marijuana (cannabis sativa) plant discovered in 1940. It is one of the 113 identified substances found in Marijuana. It accounts for up to 40% of the plant's extract. As of 2018, preliminary clinical studies on CBD included research for the treatment of anxiety, movement disorders, and pain. CBD is available and consumed in multiple forms, including inhalation of smoke or vapor, as an aerosol spray, and by mouth. It may be supplied as an oil containing CBD, capsules, dried cannabis, or as a liquid solution. CBD is thought not to be as psychoactive as THC (delta-9-tetrahydrocannabinol - the chemical in marijuana responsible for the "HIGH"). Studies suggest that CBD may interact with different biological target receptors in the body, including cannabinoid and other neurotransmitter receptors. As of 2018 the mechanism of action for its biological effects has not been determined.  Side-effects  Adverse reactions: Dry mouth, diarrhea, decreased appetite, fatigue, drowsiness, malaise, weakness, sleep disturbances, and others.  Drug interactions: CBC may interact with other medications such as blood-thinners. (Last update: 05/18/2020) ____________________________________________________________________________________________    

## 2020-06-19 NOTE — Progress Notes (Signed)
Nursing Pain Medication Assessment:  Safety precautions to be maintained throughout the outpatient stay will include: orient to surroundings, keep bed in low position, maintain call bell within reach at all times, provide assistance with transfer out of bed and ambulation.  Medication Inspection Compliance: Pill count conducted under aseptic conditions, in front of the patient. Neither the pills nor the bottle was removed from the patient's sight at any time. Once count was completed pills were immediately returned to the patient in their original bottle.  Medication: Morphine ER (MSContin) Pill/Patch Count: 15 of 60 pills remain Pill/Patch Appearance: Markings consistent with prescribed medication Bottle Appearance: Standard pharmacy container. Clearly labeled. Filled Date: 8 / 72 / 21 Last Medication intake:  TodaySafety precautions to be maintained throughout the outpatient stay will include: orient to surroundings, keep bed in low position, maintain call bell within reach at all times, provide assistance with transfer out of bed and ambulation.

## 2020-07-01 ENCOUNTER — Encounter: Payer: Medicaid Other | Admitting: Pain Medicine

## 2020-07-18 IMAGING — DX DG CHEST 1V PORT
1 series · 1 of 1 positions shown · non-contrast
Comparison: CT chest 06/21/2018

CLINICAL DATA: Mid chest pain, wheezing, tightness

EXAM:
PORTABLE CHEST 1 VIEW

[chest ap]
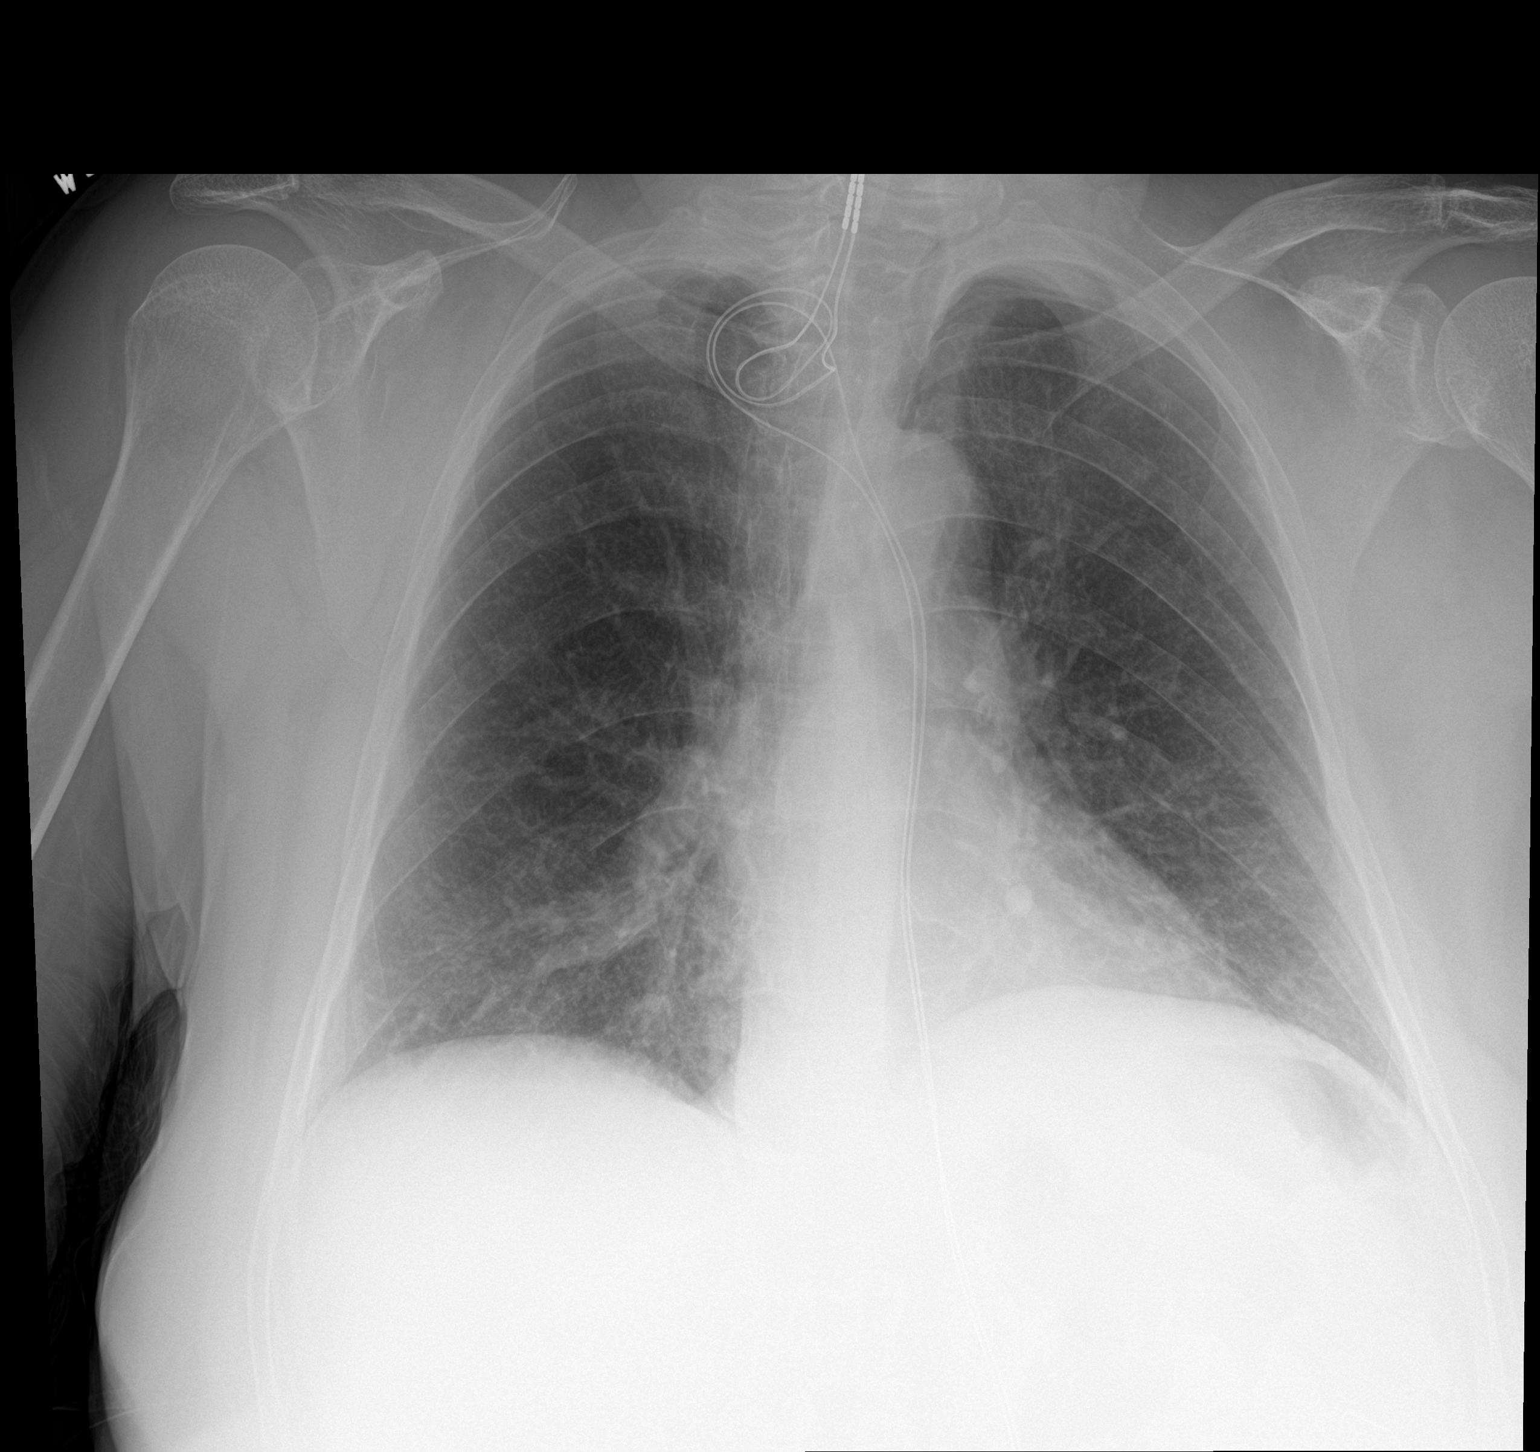

[1 of 1 positions shown; findings below may reference images not displayed]

FINDINGS: There is no focal parenchymal opacity. There is no pleural effusion
or pneumothorax. The heart and mediastinal contours are
unremarkable.

The osseous structures are unremarkable.
IMPRESSION: No active disease.

## 2020-08-21 ENCOUNTER — Encounter: Payer: Self-pay | Admitting: Pain Medicine

## 2020-08-21 ENCOUNTER — Encounter: Payer: Medicaid Other | Admitting: Pain Medicine

## 2020-08-21 ENCOUNTER — Other Ambulatory Visit: Payer: Self-pay

## 2020-08-21 ENCOUNTER — Ambulatory Visit: Payer: Medicaid Other | Attending: Pain Medicine | Admitting: Pain Medicine

## 2020-08-21 VITALS — BP 123/72 | HR 77 | Temp 98.0°F | Ht <= 58 in | Wt 109.0 lb

## 2020-08-21 DIAGNOSIS — G894 Chronic pain syndrome: Secondary | ICD-10-CM | POA: Diagnosis not present

## 2020-08-21 DIAGNOSIS — G90513 Complex regional pain syndrome I of upper limb, bilateral: Secondary | ICD-10-CM | POA: Diagnosis not present

## 2020-08-21 DIAGNOSIS — G8929 Other chronic pain: Secondary | ICD-10-CM | POA: Insufficient documentation

## 2020-08-21 DIAGNOSIS — Z969 Presence of functional implant, unspecified: Secondary | ICD-10-CM | POA: Insufficient documentation

## 2020-08-21 DIAGNOSIS — M79604 Pain in right leg: Secondary | ICD-10-CM | POA: Diagnosis not present

## 2020-08-21 DIAGNOSIS — M79605 Pain in left leg: Secondary | ICD-10-CM | POA: Insufficient documentation

## 2020-08-21 DIAGNOSIS — Z79899 Other long term (current) drug therapy: Secondary | ICD-10-CM | POA: Diagnosis present

## 2020-08-21 DIAGNOSIS — F1120 Opioid dependence, uncomplicated: Secondary | ICD-10-CM | POA: Insufficient documentation

## 2020-08-21 DIAGNOSIS — G90523 Complex regional pain syndrome I of lower limb, bilateral: Secondary | ICD-10-CM | POA: Diagnosis not present

## 2020-08-21 MED ORDER — HYDROCODONE-ACETAMINOPHEN 5-325 MG PO TABS: 1.0000 | ORAL_TABLET | Freq: Four times a day (QID) | ORAL | 0 refills | Status: DC | PRN

## 2020-08-21 NOTE — Progress Notes (Signed)
PROVIDER NOTE: Information contained herein reflects review and annotations entered in association with encounter. Interpretation of such information and data should be left to medically-trained personnel. Information provided to patient can be located elsewhere in the medical record under "Patient Instructions". Document created using STT-dictation technology, any transcriptional errors that may result from process are unintentional.    Patient: Elizabeth [name]  Service Category: E/M  Provider: Gaspar Cola, MD  DOB: [birth year]  DOS: 08/21/2020  Specialty: Interventional Pain Management  MRN: [id number]  Setting: Ambulatory outpatient  PCP: [name], NP  Type: Established Patient    Referring Provider: [name], NP  Location: Office  Delivery: Face-to-face     HPI  Ms. Jaisha Villacres Hannon, a 59 y.o. year old female, is here today because of her Chronic pain syndrome [G89.4]. Ms. Bazinet's primary complain today is Back Pain Last encounter: My last encounter with her was on 06/19/2020. Pertinent problems: Ms. Teachey has Chronic low back pain; Chronic pain syndrome; Complex regional pain syndrome I of upper limb (Bilateral); Complex regional pain syndrome I of lower limb (Bilateral); Presence of spinal cord stimulator; Chronic lower extremity pain (Bilateral); Lumbar facet syndrome (Bilateral); Lumbar spondylosis; and Chronic upper extremity pain (Bilateral) on their pertinent problem list. Pain Assessment: Severity of Chronic pain is reported as a 3 /10. Location: Back Lower/Denies. Onset: More than a month ago. Quality: Constant, Nagging, Aching. Timing: Constant. Modifying factor(s): meds and laying. Vitals:  height is 4' 9" (1.448 m) and weight is 109 lb (49.4 kg). Her temperature is 98 F (36.7 C). Her blood pressure is 123/72 and her pulse is 77. Her oxygen saturation is 95%.   Reason for encounter: medication management.  The patient indicates doing well with the  current medication regimen. No adverse reactions or side effects reported to the medications.   RTCB: 11/23/2020  Pharmacotherapy Assessment   Analgesic: Oxycodone IR 5 mg, 1 tab PO q 6 hrs (20 mg/day of oxycodone) MME/day:30 mg/day.   Monitoring: [place] PMP: PDMP reviewed during this encounter.       Pharmacotherapy: No side-effects or adverse reactions reported. Compliance: No problems identified. Effectiveness: Clinically acceptable.  Chauncey Fischer, RN  08/21/2020  1:23 PM  Sign when Signing Visit Nursing Pain Medication Assessment:  Safety precautions to be maintained throughout the outpatient stay will include: orient to surroundings, keep bed in low position, maintain call bell within reach at all times, provide assistance with transfer out of bed and ambulation.  Medication Inspection Compliance: Pill count conducted under aseptic conditions, in front of the patient. Neither the pills nor the bottle was removed from the patient's sight at any time. Once count was completed pills were immediately returned to the patient in their original bottle.  Medication: Hydrocodone/APAP Pill/Patch Count: 18 of 120 pills remain Pill/Patch Appearance: Markings consistent with prescribed medication Bottle Appearance: Standard pharmacy container. Clearly labeled. Filled Date: 10 / 15 / 21 Last Medication intake:  TodaySafety precautions to be maintained throughout the outpatient stay will include: orient to surroundings, keep bed in low position, maintain call bell within reach at all times, provide assistance with transfer out of bed and ambulation.     UDS:  Summary  Date Value Ref Range Status  03/14/2020 Note  Final    Comment:    ==================================================================== [facility] (MW) ==================================================================== Test  Result       Flag       Units Drug Present and Declared for  Prescription Verification   Oxycodone                      3915         EXPECTED   ng/mg creat   Oxymorphone                    1682         EXPECTED   ng/mg creat   Noroxycodone                   5527         EXPECTED   ng/mg creat   Noroxymorphone                 517          EXPECTED   ng/mg creat    Sources of oxycodone are scheduled prescription medications.    Oxymorphone, noroxycodone, and noroxymorphone are expected    metabolites of oxycodone. Oxymorphone is also available as a    scheduled prescription medication. ==================================================================== Test                      Result    Flag   Units      Ref Range   Creatinine              122              mg/dL      >=20 ==================================================================== Declared Medications:  The flagging and interpretation on this report are based on the  following declared medications.  Unexpected results may arise from  inaccuracies in the declared medications.  **Note: The testing scope of this panel includes these medications:  Oxycodone (Roxicodone)  **Note: The testing scope of this panel does not include the  following reported medications:  Albuterol (Ventolin HFA)  Albuterol (Duoneb)  Atorvastatin (Lipitor)  Carvedilol (Coreg)  Formoterol (Dulera)  Furosemide (Lasix)  Ipratropium (Duoneb)  Mometasone (Dulera)  Omeprazole (Prilosec)  Polyethylene Glycol (MiraLAX)  Potassium (Klor-Con)  Ramelteon (Rozerem)  Spironolactone (Aldactone) ==================================================================== For clinical consultation, please call [phone]. ====================================================================      ROS  Constitutional: Denies any fever or chills Gastrointestinal: No reported hemesis, hematochezia, vomiting, or acute GI distress Musculoskeletal: Denies any acute onset joint swelling, redness, loss of ROM, or  weakness Neurological: No reported episodes of acute onset apraxia, aphasia, dysarthria, agnosia, amnesia, paralysis, loss of coordination, or loss of consciousness  Medication Review  HYDROcodone-acetaminophen, albuterol, atorvastatin, carvedilol, furosemide, ipratropium-albuterol, mometasone-formoterol, omeprazole, oxyCODONE, polyethylene glycol, potassium chloride SA, ramelteon, and spironolactone  History Review  Allergy: Ms. Marcon is allergic to lisinopril. Drug: Ms. Loomis  reports no history of drug use. Alcohol:  reports no history of alcohol use. Tobacco:  reports that she quit smoking about 2 years ago. Her smoking use included cigarettes. She has a 40.00 pack-year smoking history. She has never used smokeless tobacco. Social: Ms. Cocking  reports that she quit smoking about 2 years ago. Her smoking use included cigarettes. She has a 40.00 pack-year smoking history. She has never used smokeless tobacco. She reports that she does not drink alcohol and does not use drugs. Medical:  has a past medical history of Anxiety, Arm pain (07/25/2015), CHF (congestive heart failure) ([place]), Congestive heart failure ([place]) (07/25/2015), COPD (chronic obstructive pulmonary disease) ([place]), Depression, Hypertension, Lower  extremity pain (07/25/2015), Reflux, RSD (reflex sympathetic dystrophy), Spinal cord stimulator status, Vitamin B12 deficiency, and Vitamin D deficiency. Surgical: Ms. Bost  has a past surgical history that includes Spinal cord stimulator implant. Family: family history includes Cancer in her father; Cirrhosis in her mother.  Laboratory Chemistry Profile   Renal Lab Results  Component Value Date   BUN 25 (H) 08/18/2018   CREATININE 0.76 08/18/2018   BCR 17 08/30/2017   GFRAA >60 08/18/2018   GFRNONAA >60 08/18/2018     Hepatic Lab Results  Component Value Date   AST 22 08/14/2018   ALT 19 08/14/2018   ALBUMIN 4.1 08/14/2018   ALKPHOS 121 08/14/2018      Electrolytes Lab Results  Component Value Date   NA 140 08/18/2018   K 4.1 08/18/2018   CL 103 08/18/2018   CALCIUM 8.7 (L) 08/18/2018   MG 2.6 (H) 04/18/2018   PHOS 4.1 04/16/2018     Bone Lab Results  Component Value Date   VD25OH 25.3 (L) 03/18/2016   VD125OH2TOT 76.2 03/18/2016   25OHVITD1 37 08/30/2017   25OHVITD2 1.0 08/30/2017   25OHVITD3 36 08/30/2017     Inflammation (CRP: Acute Phase) (ESR: Chronic Phase) Lab Results  Component Value Date   CRP 4.4 08/30/2017   ESRSEDRATE 30 08/30/2017       Note: Above Lab results reviewed.  Recent Imaging Review  CT CHEST LUNG CANCER SCREENING LOW DOSE WO CONTRAST CLINICAL DATA:  59 year old female former smoker (quit 3 years ago) with 40 pack-year history of smoking. Lung cancer screening examination.  EXAM: CT CHEST WITHOUT CONTRAST LOW-DOSE FOR LUNG CANCER SCREENING  TECHNIQUE: Multidetector CT imaging of the chest was performed following the standard protocol without IV contrast.  COMPARISON:  Low-dose lung cancer screening chest CT 06/21/2018.  FINDINGS: Cardiovascular: Heart size is normal. There is no significant pericardial fluid, thickening or pericardial calcification. There is aortic atherosclerosis, as well as atherosclerosis of the great vessels of the mediastinum and the coronary arteries, including calcified atherosclerotic plaque in the left anterior descending, left circumflex and right coronary arteries.  Mediastinum/Nodes: No pathologically enlarged mediastinal or hilar lymph nodes. Please note that accurate exclusion of hilar adenopathy is limited on noncontrast CT scans. Esophagus is unremarkable in appearance. No axillary lymphadenopathy.  Lungs/Pleura: Multiple small pulmonary nodules are again noted throughout the lungs bilaterally, largest of which is in the medial aspect of the left upper lobe (axial image 62 of series 3), with a volume derived mean diameter of only 3.7 mm. No larger  more suspicious appearing pulmonary nodules or masses are noted. No acute consolidative airspace disease. No pleural effusions. Mild diffuse bronchial wall thickening with mild centrilobular and paraseptal emphysema.  Upper Abdomen: Aortic atherosclerosis.  Musculoskeletal: There are no aggressive appearing lytic or blastic lesions noted in the visualized portions of the skeleton. Spinal cord stimulator incidentally noted.  IMPRESSION: 1. Lung-RADS 2S, benign appearance or behavior. Continue annual screening with low-dose chest CT without contrast in 12 months. 2. The "S" modifier above refers to potentially clinically significant non lung cancer related findings. Specifically, there is aortic atherosclerosis, in addition to 3 vessel coronary artery disease. Please note that although the presence of coronary artery calcium documents the presence of coronary artery disease, the severity of this disease and any potential stenosis cannot be assessed on this non-gated CT examination. Assessment for potential risk factor modification, dietary therapy or pharmacologic therapy may be warranted, if clinically indicated. 3. Mild diffuse bronchial wall thickening with  mild centrilobular and paraseptal emphysema; imaging findings suggestive of underlying COPD.  Aortic Atherosclerosis (ICD10-I70.0) and Emphysema (ICD10-J43.9).  Electronically Signed   By: Vinnie Langton M.D.   On: 11/07/2019 09:09 Note: Reviewed        Physical Exam  General appearance: Well nourished, well developed, and well hydrated. In no apparent acute distress Mental status: Alert, oriented x 3 (person, place, & time)       Respiratory: No evidence of acute respiratory distress Eyes: PERLA Vitals: BP 123/72   Pulse 77   Temp 98 F (36.7 C)   Ht 4' 9" (1.448 m)   Wt 109 lb (49.4 kg)   SpO2 95%   BMI 23.59 kg/m  BMI: Estimated body mass index is 23.59 kg/m as calculated from the following:   Height as of  this encounter: 4' 9" (1.448 m).   Weight as of this encounter: 109 lb (49.4 kg). Ideal: Patient must be at least 60 in tall to calculate ideal body weight  Assessment   Status Diagnosis  Controlled Controlled Controlled 1. Chronic pain syndrome   2. Complex regional pain syndrome type 1 of both lower extremities   3. Complex regional pain syndrome type 1 of both upper extremities   4. Chronic lower extremity pain (Bilateral)   5. Presence of spinal cord stimulator   6. Pharmacologic therapy   7. Uncomplicated opioid dependence ([place])      Updated Problems: No problems updated.  Plan of Care  Problem-specific:  No problem-specific Assessment & Plan notes found for this encounter.  Ms. Jasmine Mcbeth Sween has a current medication list which includes the following long-term medication(s): albuterol, atorvastatin, carvedilol, furosemide, ipratropium-albuterol, omeprazole, potassium chloride sa, ramelteon, [START ON 08/25/2020] hydrocodone-acetaminophen, [START ON 09/24/2020] hydrocodone-acetaminophen, [START ON 10/24/2020] hydrocodone-acetaminophen, mometasone-formoterol, and spironolactone.  Pharmacotherapy (Medications Ordered): Meds ordered this encounter  Medications  . HYDROcodone-acetaminophen (NORCO/VICODIN) 5-325 MG tablet    Sig: Take 1 tablet by mouth every 6 (six) hours as needed for severe pain. Must last 30 days.    Dispense:  120 tablet    Refill:  0    Chronic Pain: STOP Act (Not applicable) Fill 1 day early if closed on refill date. Avoid benzodiazepines within 8 hours of opioids  . HYDROcodone-acetaminophen (NORCO/VICODIN) 5-325 MG tablet    Sig: Take 1 tablet by mouth every 6 (six) hours as needed for severe pain. Must last 30 days.    Dispense:  120 tablet    Refill:  0    Chronic Pain: STOP Act (Not applicable) Fill 1 day early if closed on refill date. Avoid benzodiazepines within 8 hours of opioids  . HYDROcodone-acetaminophen (NORCO/VICODIN) 5-325 MG tablet     Sig: Take 1 tablet by mouth every 6 (six) hours as needed for severe pain. Must last 30 days.    Dispense:  120 tablet    Refill:  0    Chronic Pain: STOP Act (Not applicable) Fill 1 day early if closed on refill date. Avoid benzodiazepines within 8 hours of opioids   Orders:  No orders of the defined types were placed in this encounter.  Follow-up plan:   Return in about 3 months (around 11/23/2020) for (F2F), (Med Mgmt).      Interventional therapies:  Considering:   Diagnostic bilateral lumbar facet block  Possible bilateral lumbar facet RFA  Possible bilateral lumbar sympathetic RFA    Palliative PRN treatment(s):   Palliative right vs left SGBs  Palliative  right vs left LSBs  Recent Visits Date Type Provider Dept  06/19/20 Office Visit Milinda Pointer, MD [facility]  05/27/20 Office Visit Milinda Pointer, MD [facility]  Showing recent visits within past 90 days and meeting all other requirements Today's Visits Date Type Provider Dept  08/21/20 Office Visit Milinda Pointer, MD [facility]  Showing today's visits and meeting all other requirements Future Appointments No visits were found meeting these conditions. Showing future appointments within next 90 days and meeting all other requirements  I discussed the assessment and treatment plan with the patient. The patient was provided an opportunity to ask questions and all were answered. The patient agreed with the plan and demonstrated an understanding of the instructions.  Patient advised to call back or seek an in-person evaluation if the symptoms or condition worsens.  Duration of encounter: 30 minutes.  Note by: Gaspar Cola, MD Date: 08/21/2020; Time: 1:44 PM

## 2020-08-21 NOTE — Progress Notes (Signed)
Nursing Pain Medication Assessment:  Safety precautions to be maintained throughout the outpatient stay will include: orient to surroundings, keep bed in low position, maintain call bell within reach at all times, provide assistance with transfer out of bed and ambulation.  Medication Inspection Compliance: Pill count conducted under aseptic conditions, in front of the patient. Neither the pills nor the bottle was removed from the patient's sight at any time. Once count was completed pills were immediately returned to the patient in their original bottle.  Medication: Hydrocodone/APAP Pill/Patch Count: 18 of 120 pills remain Pill/Patch Appearance: Markings consistent with prescribed medication Bottle Appearance: Standard pharmacy container. Clearly labeled. Filled Date: 10 / 15 / 21 Last Medication intake:  TodaySafety precautions to be maintained throughout the outpatient stay will include: orient to surroundings, keep bed in low position, maintain call bell within reach at all times, provide assistance with transfer out of bed and ambulation.

## 2020-08-21 NOTE — Patient Instructions (Signed)
____________________________________________________________________________________________  CBD (cannabidiol) WARNING  Applicable to: All individuals currently taking or considering taking CBD (cannabidiol) and, more important, all patients taking opioid analgesic controlled substances (pain medication). (Example: oxycodone; oxymorphone; hydrocodone; hydromorphone; morphine; methadone; tramadol; tapentadol; fentanyl; buprenorphine; butorphanol; dextromethorphan; meperidine; codeine; etc.)  Legal status: CBD remains a Schedule I drug prohibited for any use. CBD is illegal with one exception. In the [place], CBD has a limited Food and Drug Administration (FDA) approval for the treatment of two specific types of epilepsy disorders. Only one CBD product has been approved by the FDA for this purpose: "Epidiolex". FDA is aware that some companies are marketing products containing cannabis and cannabis-derived compounds in ways that violate the Federal Food, Drug and Cosmetic Act (FD&C Act) and that may put the health and safety of consumers at risk. The FDA, a Federal agency, has not enforced the CBD status since 2018.   Legality: Some manufacturers ship CBD products nationally, which is illegal. Often such products are sold online and are therefore available throughout the country. CBD is openly sold in head shops and health food stores in some states where such sales have not been explicitly legalized. Selling unapproved products with unsubstantiated therapeutic claims is not only a violation of the law, but also can put patients at risk, as these products have not been proven to be safe or effective. Federal illegality makes it difficult to conduct research on CBD.  Reference: "FDA Regulation of Cannabis and Cannabis-Derived Products, Including Cannabidiol (CBD)" -  https://www.fda.gov/news-events/public-health-focus/fda-regulation-cannabis-and-cannabis-derived-products-including-cannabidiol-cbd  Warning: CBD is not FDA approved and has not undergo the same manufacturing controls as prescription drugs.  This means that the purity and safety of available CBD may be questionable. Most of the time, despite manufacturer's claims, it is contaminated with THC (delta-9-tetrahydrocannabinol - the chemical in marijuana responsible for the "HIGH").  When this is the case, the THC contaminant will trigger a positive urine drug screen (UDS) test for Marijuana (carboxy-THC). Because a positive UDS for any illicit substance is a violation of our medication agreement, your opioid analgesics (pain medicine) may be permanently discontinued.  MORE ABOUT CBD  General Information: CBD  is a derivative of the Marijuana (cannabis sativa) plant discovered in 1940. It is one of the 113 identified substances found in Marijuana. It accounts for up to 40% of the plant's extract. As of 2018, preliminary clinical studies on CBD included research for the treatment of anxiety, movement disorders, and pain. CBD is available and consumed in multiple forms, including inhalation of smoke or vapor, as an aerosol spray, and by mouth. It may be supplied as an oil containing CBD, capsules, dried cannabis, or as a liquid solution. CBD is thought not to be as psychoactive as THC (delta-9-tetrahydrocannabinol - the chemical in marijuana responsible for the "HIGH"). Studies suggest that CBD may interact with different biological target receptors in the body, including cannabinoid and other neurotransmitter receptors. As of 2018 the mechanism of action for its biological effects has not been determined.  Side-effects  Adverse reactions: Dry mouth, diarrhea, decreased appetite, fatigue, drowsiness, malaise, weakness, sleep disturbances, and others.  Drug interactions: CBC may interact with other medications  such as blood-thinners. (Last update: 05/18/2020) ____________________________________________________________________________________________   ____________________________________________________________________________________________  Medication Rules  Purpose: To inform patients, and their family members, of our rules and regulations.  Applies to: All patients receiving prescriptions (written or electronic).  Pharmacy of record: Pharmacy where electronic prescriptions will be sent. If written prescriptions are taken to a different pharmacy, please inform   the nursing staff. The pharmacy listed in the electronic medical record should be the one where you would like electronic prescriptions to be sent.  Electronic prescriptions: In compliance with the [place] Strengthen Opioid Misuse Prevention (STOP) Act of 2017 (Session Law 2017-74/H243), effective October 12, 2018, all controlled substances must be electronically prescribed. Calling prescriptions to the pharmacy will cease to exist.  Prescription refills: Only during scheduled appointments. Applies to all prescriptions.  NOTE: The following applies primarily to controlled substances (Opioid* Pain Medications).   Type of encounter (visit): For patients receiving controlled substances, face-to-face visits are required. (Not an option or up to the patient.)  Patient's responsibilities: 1. Pain Pills: Bring all pain pills to every appointment (except for procedure appointments). 2. Pill Bottles: Bring pills in original pharmacy bottle. Always bring the newest bottle. Bring bottle, even if empty. 3. Medication refills: You are responsible for knowing and keeping track of what medications you take and those you need refilled. The day before your appointment: write a list of all prescriptions that need to be refilled. The day of the appointment: give the list to the admitting nurse. Prescriptions will be written only during  appointments. No prescriptions will be written on procedure days. If you forget a medication: it will not be "Called in", "Faxed", or "electronically sent". You will need to get another appointment to get these prescribed. No early refills. Do not call asking to have your prescription filled early. 4. Prescription Accuracy: You are responsible for carefully inspecting your prescriptions before leaving our office. Have the discharge nurse carefully go over each prescription with you, before taking them home. Make sure that your name is accurately spelled, that your address is correct. Check the name and dose of your medication to make sure it is accurate. Check the number of pills, and the written instructions to make sure they are clear and accurate. Make sure that you are given enough medication to last until your next medication refill appointment. 5. Taking Medication: Take medication as prescribed. When it comes to controlled substances, taking less pills or less frequently than prescribed is permitted and encouraged. Never take more pills than instructed. Never take medication more frequently than prescribed.  6. Inform other Doctors: Always inform, all of your healthcare providers, of all the medications you take. 7. Pain Medication from other Providers: You are not allowed to accept any additional pain medication from any other Doctor or Healthcare provider. There are two exceptions to this rule. (see below) In the event that you require additional pain medication, you are responsible for notifying us, as stated below. 8. Medication Agreement: You are responsible for carefully reading and following our Medication Agreement. This must be signed before receiving any prescriptions from our practice. Safely store a copy of your signed Agreement. Violations to the Agreement will result in no further prescriptions. (Additional copies of our Medication Agreement are available upon request.) 9. Laws, Rules,  & Regulations: All patients are expected to follow all Federal and State Laws, Statutes, Rules, & Regulations. Ignorance of the Laws does not constitute a valid excuse.  10. Illegal drugs and Controlled Substances: The use of illegal substances (including, but not limited to marijuana and its derivatives) and/or the illegal use of any controlled substances is strictly prohibited. Violation of this rule may result in the immediate and permanent discontinuation of any and all prescriptions being written by our practice. The use of any illegal substances is prohibited. 11. Adopted CDC guidelines & recommendations: Target dosing   levels will be at or below 60 MME/day. Use of benzodiazepines** is not recommended.  Exceptions: There are only two exceptions to the rule of not receiving pain medications from other Healthcare Providers. 1. Exception #1 (Emergencies): In the event of an emergency (i.e.: accident requiring emergency care), you are allowed to receive additional pain medication. However, you are responsible for: As soon as you are able, call our office [phone], at any time of the day or night, and leave a message stating your name, the date and nature of the emergency, and the name and dose of the medication prescribed. In the event that your call is answered by a member of our staff, make sure to document and save the date, time, and the name of the person that took your information.  2. Exception #2 (Planned Surgery): In the event that you are scheduled by another doctor or dentist to have any type of surgery or procedure, you are allowed (for a period no longer than 30 days), to receive additional pain medication, for the acute post-op pain. However, in this case, you are responsible for picking up a copy of our "Post-op Pain Management for Surgeons" handout, and giving it to your surgeon or dentist. This document is available at our office, and does not require an appointment to obtain it. Simply  go to our office during business hours (Monday-Thursday from 8:00 AM to 4:00 PM) (Friday 8:00 AM to 12:00 Noon) or if you have a scheduled appointment with us, prior to your surgery, and ask for it by name. In addition, you are responsible for: calling our office [phone], at any time of the day or night, and leaving a message stating your name, name of your surgeon, type of surgery, and date of procedure or surgery. Failure to comply with your responsibilities may result in termination of therapy involving the controlled substances.  *Opioid medications include: morphine, codeine, oxycodone, oxymorphone, hydrocodone, hydromorphone, meperidine, tramadol, tapentadol, buprenorphine, fentanyl, methadone. **Benzodiazepine medications include: diazepam (Valium), alprazolam (Xanax), clonazepam (Klonopine), lorazepam (Ativan), clorazepate (Tranxene), chlordiazepoxide (Librium), estazolam (Prosom), oxazepam (Serax), temazepam (Restoril), triazolam (Halcion) (Last updated: 06/18/2020) ____________________________________________________________________________________________   ____________________________________________________________________________________________  Medication Recommendations and Reminders  Applies to: All patients receiving prescriptions (written and/or electronic).  Medication Rules & Regulations: These rules and regulations exist for your safety and that of others. They are not flexible and neither are we. Dismissing or ignoring them will be considered "non-compliance" with medication therapy, resulting in complete and irreversible termination of such therapy. (See document titled "Medication Rules" for more details.) In all conscience, because of safety reasons, we cannot continue providing a therapy where the patient does not follow instructions.  Pharmacy of record:   Definition: This is the pharmacy where your electronic prescriptions will be sent.   We do not endorse any  particular pharmacy, however, we have experienced problems with [facility] not securing enough medication supply for the community.  We do not restrict you in your choice of pharmacy. However, once we write for your prescriptions, we will NOT be re-sending more prescriptions to fix restricted supply problems created by your pharmacy, or your insurance.   The pharmacy listed in the electronic medical record should be the one where you want electronic prescriptions to be sent.  If you choose to change pharmacy, simply notify our nursing staff.  Recommendations:  Keep all of your pain medications in a safe place, under lock and key, even if you live alone. We will NOT replace lost, stolen, or   damaged medication.  After you fill your prescription, take 1 week's worth of pills and put them away in a safe place. You should keep a separate, properly labeled bottle for this purpose. The remainder should be kept in the original bottle. Use this as your primary supply, until it runs out. Once it's gone, then you know that you have 1 week's worth of medicine, and it is time to come in for a prescription refill. If you do this correctly, it is unlikely that you will ever run out of medicine.  To make sure that the above recommendation works, it is very important that you make sure your medication refill appointments are scheduled at least 1 week before you run out of medicine. To do this in an effective manner, make sure that you do not leave the office without scheduling your next medication management appointment. Always ask the nursing staff to show you in your prescription , when your medication will be running out. Then arrange for the receptionist to get you a return appointment, at least 7 days before you run out of medicine. Do not wait until you have 1 or 2 pills left, to come in. This is very poor planning and does not take into consideration that we may need to cancel appointments due to bad weather,  sickness, or emergencies affecting our staff.  DO NOT ACCEPT A "Partial Fill": If for any reason your pharmacy does not have enough pills/tablets to completely fill or refill your prescription, do not allow for a "partial fill". The law allows the pharmacy to complete that prescription within 72 hours, without requiring a new prescription. If they do not fill the rest of your prescription within those 72 hours, you will need a separate prescription to fill the remaining amount, which we will NOT provide. If the reason for the partial fill is your insurance, you will need to talk to the pharmacist about payment alternatives for the remaining tablets, but again, DO NOT ACCEPT A PARTIAL FILL, unless you can trust your pharmacist to obtain the remainder of the pills within 72 hours.  Prescription refills and/or changes in medication(s):   Prescription refills, and/or changes in dose or medication, will be conducted only during scheduled medication management appointments. (Applies to both, written and electronic prescriptions.)  No refills on procedure days. No medication will be changed or started on procedure days. No changes, adjustments, and/or refills will be conducted on a procedure day. Doing so will interfere with the diagnostic portion of the procedure.  No phone refills. No medications will be "called into the pharmacy".  No Fax refills.  No weekend refills.  No Holliday refills.  No after hours refills.  Remember:  Business hours are:  Monday to Thursday 8:00 AM to 4:00 PM Provider's Schedule: Delano Metz, MD - Appointments are:  Medication management: Monday and Wednesday 8:00 AM to 4:00 PM Procedure day: Tuesday and Thursday 7:30 AM to 4:00 PM Edward Jolly, MD - Appointments are:  Medication management: Tuesday and Thursday 8:00 AM to 4:00 PM Procedure day: Monday and Wednesday 7:30 AM to 4:00 PM (Last update:  05/01/2020) ____________________________________________________________________________________________   ____________________________________________________________________________________________  Virtual Visits   Eligibility In order to be eligible for "Virtual Visit Encounters", you must be available over the phone. It is the patient's responsibility to make sure we have a way to contact you.  We understand how people are reluctant to pickup on "unknown" calls, therefore, we suggest adding our telephone numbers to your list of "  CONTACT(s)". This way, you should be able to readily identify our calls when you receive them.  When will this type of visits be used? The decision will be made on a case by case basis.  At what time will I be called? This is an excellent question. The providers will try to call you during the day, whenever they have spare time. For the purpose of the day's schedule, you are assigned a time for your appointment, however, the call may come anytime during the day. We need you to be available on a moment's notice. If you are unable to do this, then request an "in-person" appointment rather than a "virtual visit".  Can I request my medication visits to be "Virtual"? Yes you may request it, but the decision is entirely up to the healthcare provider. Control substances require specific monitoring that requires Face-to-Face encounters. The number of encounters  and the extent of the monitoring is determined on a case by case basis.  Add a new contact to your smart phone and label it "PAIN CLINIC" Under this contact add the following numbers: Main: [phone] (Official Contact Number) Nurses: [phone] (These are outgoing only calling systems. Do not call these numbers.) Dr. Laban Emperor: [phone] or [phone] (These are outgoing only calling systems. Do not call these  numbers.)  ____________________________________________________________________________________________

## 2020-11-13 ENCOUNTER — Telehealth: Payer: Self-pay | Admitting: *Deleted

## 2020-11-13 NOTE — Telephone Encounter (Signed)
Attempted to contact patient for scheduling a lung screening scan left a message for patient to call [phone] to schedule CT scan for future.

## 2020-11-18 NOTE — Progress Notes (Signed)
PROVIDER NOTE: Information contained herein reflects review and annotations entered in association with encounter. Interpretation of such information and data should be left to medically-trained personnel. Information provided to patient can be located elsewhere in the medical record under "Patient Instructions". Document created using STT-dictation technology, any transcriptional errors that may result from process are unintentional.    Patient: Elizabeth [name]  Service Category: E/M  Provider: Gaspar Cola, MD  DOB: 07-09-1961  DOS: 11/20/2020  Specialty: Interventional Pain Management  MRN: [id number]  Setting: Ambulatory outpatient  PCP: [name], NP  Type: Established Patient    Referring Provider: [name], NP  Location: Office  Delivery: Face-to-face     HPI  Elizabeth [name], a 60 y.o. year old female, is here today because of her Chronic pain syndrome [G89.4]. Elizabeth [name] primary complain today is Back Pain (lower) Last encounter: My last encounter with her was on 08/21/2020. Pertinent problems: Elizabeth [name] has Chronic low back pain; Chronic pain syndrome; Complex regional pain syndrome I of upper limb (Bilateral); Complex regional pain syndrome I of lower limb (Bilateral); Presence of spinal cord stimulator; Chronic lower extremity pain (Bilateral); Lumbar facet syndrome (Bilateral); Lumbar spondylosis; and Complex regional pain syndrome type 1 of both upper extremities on their pertinent problem list. Pain Assessment: Severity of Chronic pain is reported as a 3 /10. Location: Back Lower/denies. Onset: More than a month ago. Quality: Other (Comment) (annoying). Timing: Constant. Modifying factor(s): medications. Vitals:  height is $RemoveB'4\' 9"'hWoZOZpO$  (1.448 m) and weight is 125 lb (56.7 kg). Her temporal temperature is 97 F (36.1 C) (abnormal). Her blood pressure is 131/70 and her pulse is 73. Her respiration is 16 and oxygen saturation is 99%.   Reason for encounter:  medication management.  The patient indicates doing well with the current medication regimen. No adverse reactions or side effects reported to the medications.   RTCB: 02/21/2021  Pharmacotherapy Assessment   Analgesic: Oxycodone IR 5 mg, 1 tab PO q 6 hrs (20 mg/day of oxycodone) MME/day:30 mg/day.   Monitoring: [place] PMP: PDMP reviewed during this encounter.       Pharmacotherapy: No side-effects or adverse reactions reported. Compliance: No problems identified. Effectiveness: Clinically acceptable.  Landis Martins, RN  11/20/2020  1:44 PM  Sign when Signing Visit Nursing Pain Medication Assessment:  Safety precautions to be maintained throughout the outpatient stay will include: orient to surroundings, keep bed in low position, maintain call bell within reach at all times, provide assistance with transfer out of bed and ambulation.  Medication Inspection Compliance: Pill count conducted under aseptic conditions, in front of the patient. Neither the pills nor the bottle was removed from the patient's sight at any time. Once count was completed pills were immediately returned to the patient in their original bottle.  Medication: Hydrocodone/APAP Pill/Patch Count: 16 of 120 pills remain Pill/Patch Appearance: Markings consistent with prescribed medication Bottle Appearance: Standard pharmacy container. Clearly labeled. Filled Date: 10/24/2020 Last Medication intake:  Today    UDS:  Summary  Date Value Ref Range Status  03/14/2020 Note  Final    Comment:    ==================================================================== [facility] (MW) ==================================================================== Test                             Result       Flag       Units Drug Present and Declared for Prescription Verification   Oxycodone  3915         EXPECTED   ng/mg creat   Oxymorphone                    1682         EXPECTED   ng/mg creat    Noroxycodone                   5527         EXPECTED   ng/mg creat   Noroxymorphone                 517          EXPECTED   ng/mg creat    Sources of oxycodone are scheduled prescription medications.    Oxymorphone, noroxycodone, and noroxymorphone are expected    metabolites of oxycodone. Oxymorphone is also available as a    scheduled prescription medication. ==================================================================== Test                      Result    Flag   Units      Ref Range   Creatinine              122              mg/dL      >=20 ==================================================================== Declared Medications:  The flagging and interpretation on this report are based on the  following declared medications.  Unexpected results may arise from  inaccuracies in the declared medications.  **Note: The testing scope of this panel includes these medications:  Oxycodone (Roxicodone)  **Note: The testing scope of this panel does not include the  following reported medications:  Albuterol (Ventolin HFA)  Albuterol (Duoneb)  Atorvastatin (Lipitor)  Carvedilol (Coreg)  Formoterol (Dulera)  Furosemide (Lasix)  Ipratropium (Duoneb)  Mometasone (Dulera)  Omeprazole (Prilosec)  Polyethylene Glycol (MiraLAX)  Potassium (Klor-Con)  Ramelteon (Rozerem)  Spironolactone (Aldactone) ==================================================================== For clinical consultation, please call [phone]. ====================================================================      ROS  Constitutional: Denies any fever or chills Gastrointestinal: No reported hemesis, hematochezia, vomiting, or acute GI distress Musculoskeletal: Denies any acute onset joint swelling, redness, loss of ROM, or weakness Neurological: No reported episodes of acute onset apraxia, aphasia, dysarthria, agnosia, amnesia, paralysis, loss of coordination, or loss of consciousness  Medication Review   HYDROcodone-acetaminophen, albuterol, atorvastatin, carvedilol, furosemide, ipratropium-albuterol, mometasone-formoterol, omeprazole, oxyCODONE, polyethylene glycol, potassium chloride SA, ramelteon, and spironolactone  History Review  Allergy: Elizabeth [name] is allergic to lisinopril. Drug: Elizabeth [name]  reports no history of drug use. Alcohol:  reports no history of alcohol use. Tobacco:  reports that she quit smoking about 2 years ago. Her smoking use included cigarettes. She has a 40.00 pack-year smoking history. She has never used smokeless tobacco. Social: Elizabeth [name]  reports that she quit smoking about 2 years ago. Her smoking use included cigarettes. She has a 40.00 pack-year smoking history. She has never used smokeless tobacco. She reports that she does not drink alcohol and does not use drugs. Medical:  has a past medical history of Anxiety, Arm pain (07/25/2015), CHF (congestive heart failure) ([place]), Congestive heart failure ([place]) (07/25/2015), COPD (chronic obstructive pulmonary disease) ([place]), Depression, Hypertension, Lower extremity pain (07/25/2015), Reflux, RSD (reflex sympathetic dystrophy), Spinal cord stimulator status, Vitamin B12 deficiency, and Vitamin D deficiency. Surgical: Elizabeth [name]  has a past surgical history that includes Spinal cord stimulator implant. Family: family history includes Cancer in her father; Cirrhosis in her mother.  Laboratory Chemistry Profile   Renal Lab Results  Component Value Date   BUN 25 (H) 08/18/2018   CREATININE 0.76 08/18/2018   BCR 17 08/30/2017   GFRAA >60 08/18/2018   GFRNONAA >60 08/18/2018     Hepatic Lab Results  Component Value Date   AST 22 08/14/2018   ALT 19 08/14/2018   ALBUMIN 4.1 08/14/2018   ALKPHOS 121 08/14/2018     Electrolytes Lab Results  Component Value Date   NA 140 08/18/2018   K 4.1 08/18/2018   CL 103 08/18/2018   CALCIUM 8.7 (L) 08/18/2018   MG 2.6 (H) 04/18/2018   PHOS 4.1 04/16/2018      Bone Lab Results  Component Value Date   VD25OH 25.3 (L) 03/18/2016   VD125OH2TOT 76.2 03/18/2016   25OHVITD1 37 08/30/2017   25OHVITD2 1.0 08/30/2017   25OHVITD3 36 08/30/2017     Inflammation (CRP: Acute Phase) (ESR: Chronic Phase) Lab Results  Component Value Date   CRP 4.4 08/30/2017   ESRSEDRATE 30 08/30/2017       Note: Above Lab results reviewed.  Recent Imaging Review  CT CHEST LUNG CANCER SCREENING LOW DOSE WO CONTRAST CLINICAL DATA:  60 year old female former smoker (quit 3 years ago) with 40 pack-year history of smoking. Lung cancer screening examination.  EXAM: CT CHEST WITHOUT CONTRAST LOW-DOSE FOR LUNG CANCER SCREENING  TECHNIQUE: Multidetector CT imaging of the chest was performed following the standard protocol without IV contrast.  COMPARISON:  Low-dose lung cancer screening chest CT 06/21/2018.  FINDINGS: Cardiovascular: Heart size is normal. There is no significant pericardial fluid, thickening or pericardial calcification. There is aortic atherosclerosis, as well as atherosclerosis of the great vessels of the mediastinum and the coronary arteries, including calcified atherosclerotic plaque in the left anterior descending, left circumflex and right coronary arteries.  Mediastinum/Nodes: No pathologically enlarged mediastinal or hilar lymph nodes. Please note that accurate exclusion of hilar adenopathy is limited on noncontrast CT scans. Esophagus is unremarkable in appearance. No axillary lymphadenopathy.  Lungs/Pleura: Multiple small pulmonary nodules are again noted throughout the lungs bilaterally, largest of which is in the medial aspect of the left upper lobe (axial image 62 of series 3), with a volume derived mean diameter of only 3.7 mm. No larger more suspicious appearing pulmonary nodules or masses are noted. No acute consolidative airspace disease. No pleural effusions. Mild diffuse bronchial wall thickening with mild centrilobular and  paraseptal emphysema.  Upper Abdomen: Aortic atherosclerosis.  Musculoskeletal: There are no aggressive appearing lytic or blastic lesions noted in the visualized portions of the skeleton. Spinal cord stimulator incidentally noted.  IMPRESSION: 1. Lung-RADS 2S, benign appearance or behavior. Continue annual screening with low-dose chest CT without contrast in 12 months. 2. The "S" modifier above refers to potentially clinically significant non lung cancer related findings. Specifically, there is aortic atherosclerosis, in addition to 3 vessel coronary artery disease. Please note that although the presence of coronary artery calcium documents the presence of coronary artery disease, the severity of this disease and any potential stenosis cannot be assessed on this non-gated CT examination. Assessment for potential risk factor modification, dietary therapy or pharmacologic therapy may be warranted, if clinically indicated. 3. Mild diffuse bronchial wall thickening with mild centrilobular and paraseptal emphysema; imaging findings suggestive of underlying COPD.  Aortic Atherosclerosis (ICD10-I70.0) and Emphysema (ICD10-J43.9).  Electronically Signed   By: Vinnie Langton M.D.   On: 11/07/2019 09:09 Note: Reviewed        Physical Exam  General appearance:  Well nourished, well developed, and well hydrated. In no apparent acute distress Mental status: Alert, oriented x 3 (person, place, & time)       Respiratory: No evidence of acute respiratory distress Eyes: PERLA Vitals: BP 131/70 (BP Location: Right Arm, Patient Position: Sitting, Cuff Size: Large)   Pulse 73   Temp (!) 97 F (36.1 C) (Temporal)   Resp 16   Ht $R'4\' 9"'ZD$  (1.448 m)   Wt 125 lb (56.7 kg)   SpO2 99%   BMI 27.05 kg/m  BMI: Estimated body mass index is 27.05 kg/m as calculated from the following:   Height as of this encounter: $RemoveBeforeD'4\' 9"'IlzcbIznFUwGsf$  (1.448 m).   Weight as of this encounter: 125 lb (56.7 kg). Ideal: Patient  must be at least 60 in tall to calculate ideal body weight  Assessment   Status Diagnosis  Controlled Controlled Controlled 1. Chronic pain syndrome   2. Complex regional pain syndrome type 1 of both lower extremities   3. Complex regional pain syndrome type 1 of both upper extremities   4. Chronic lower extremity pain (Bilateral)   5. Presence of spinal cord stimulator   6. Pharmacologic therapy   7. Uncomplicated opioid dependence ([place])      Updated Problems: Problem  Complex Regional Pain Syndrome Type 1 of Both Upper Extremities  Complex Regional Pain Syndrome Type 1 of Both Lower Extremities    Plan of Care  Problem-specific:  No problem-specific Assessment & Plan notes found for this encounter.  Elizabeth [name] has a current medication list which includes the following long-term medication(s): albuterol, atorvastatin, carvedilol, furosemide, ipratropium-albuterol, omeprazole, potassium chloride sa, ramelteon, [START ON 11/23/2020] hydrocodone-acetaminophen, [START ON 12/23/2020] hydrocodone-acetaminophen, [START ON 01/22/2021] hydrocodone-acetaminophen, mometasone-formoterol, and spironolactone.  Pharmacotherapy (Medications Ordered): Meds ordered this encounter  Medications  . HYDROcodone-acetaminophen (NORCO/VICODIN) 5-325 MG tablet    Sig: Take 1 tablet by mouth every 6 (six) hours as needed for severe pain. Must last 30 days.    Dispense:  120 tablet    Refill:  0    Chronic Pain: STOP Act (Not applicable) Fill 1 day early if closed on refill date. Avoid benzodiazepines within 8 hours of opioids  . HYDROcodone-acetaminophen (NORCO/VICODIN) 5-325 MG tablet    Sig: Take 1 tablet by mouth every 6 (six) hours as needed for severe pain. Must last 30 days.    Dispense:  120 tablet    Refill:  0    Chronic Pain: STOP Act (Not applicable) Fill 1 day early if closed on refill date. Avoid benzodiazepines within 8 hours of opioids  . HYDROcodone-acetaminophen  (NORCO/VICODIN) 5-325 MG tablet    Sig: Take 1 tablet by mouth every 6 (six) hours as needed for severe pain. Must last 30 days.    Dispense:  120 tablet    Refill:  0    Chronic Pain: STOP Act (Not applicable) Fill 1 day early if closed on refill date. Avoid benzodiazepines within 8 hours of opioids   Orders:  No orders of the defined types were placed in this encounter.  Follow-up plan:   Return in about 3 months (around 02/21/2021) for (F2F), (Med Mgmt).     Interventional therapies:  Considering:   Diagnostic bilateral lumbar facet block  Possible bilateral lumbar facet RFA  Possible bilateral lumbar sympathetic RFA    Palliative PRN treatment(s):   Palliative right vs left SGBs  Palliative  right vs left LSBs     Recent Visits No visits were found meeting  these conditions. Showing recent visits within past 90 days and meeting all other requirements Today's Visits Date Type Provider Dept  11/20/20 Office Visit Milinda Pointer, MD [facility]  Showing today's visits and meeting all other requirements Future Appointments No visits were found meeting these conditions. Showing future appointments within next 90 days and meeting all other requirements  I discussed the assessment and treatment plan with the patient. The patient was provided an opportunity to ask questions and all were answered. The patient agreed with the plan and demonstrated an understanding of the instructions.  Patient advised to call back or seek an in-person evaluation if the symptoms or condition worsens.  Duration of encounter: 22 minutes.  Note by: Gaspar Cola, MD Date: 11/20/2020; Time: 3:56 PM

## 2020-11-20 ENCOUNTER — Encounter: Payer: Self-pay | Admitting: Pain Medicine

## 2020-11-20 ENCOUNTER — Ambulatory Visit: Payer: Medicaid Other | Attending: Pain Medicine | Admitting: Pain Medicine

## 2020-11-20 ENCOUNTER — Other Ambulatory Visit: Payer: Self-pay

## 2020-11-20 VITALS — BP 131/70 | HR 73 | Temp 97.0°F | Resp 16 | Ht <= 58 in | Wt 125.0 lb

## 2020-11-20 DIAGNOSIS — M79604 Pain in right leg: Secondary | ICD-10-CM | POA: Diagnosis present

## 2020-11-20 DIAGNOSIS — G8929 Other chronic pain: Secondary | ICD-10-CM | POA: Diagnosis present

## 2020-11-20 DIAGNOSIS — F1120 Opioid dependence, uncomplicated: Secondary | ICD-10-CM

## 2020-11-20 DIAGNOSIS — G90513 Complex regional pain syndrome I of upper limb, bilateral: Secondary | ICD-10-CM | POA: Diagnosis not present

## 2020-11-20 DIAGNOSIS — Z969 Presence of functional implant, unspecified: Secondary | ICD-10-CM | POA: Diagnosis present

## 2020-11-20 DIAGNOSIS — M79605 Pain in left leg: Secondary | ICD-10-CM | POA: Diagnosis present

## 2020-11-20 DIAGNOSIS — G90523 Complex regional pain syndrome I of lower limb, bilateral: Secondary | ICD-10-CM | POA: Diagnosis present

## 2020-11-20 DIAGNOSIS — Z79899 Other long term (current) drug therapy: Secondary | ICD-10-CM | POA: Diagnosis present

## 2020-11-20 DIAGNOSIS — G894 Chronic pain syndrome: Secondary | ICD-10-CM | POA: Diagnosis present

## 2020-11-20 MED ORDER — HYDROCODONE-ACETAMINOPHEN 5-325 MG PO TABS: 1.0000 | ORAL_TABLET | Freq: Four times a day (QID) | ORAL | 0 refills | Status: DC | PRN

## 2020-11-20 NOTE — Progress Notes (Signed)
Nursing Pain Medication Assessment:  Safety precautions to be maintained throughout the outpatient stay will include: orient to surroundings, keep bed in low position, maintain call bell within reach at all times, provide assistance with transfer out of bed and ambulation.  Medication Inspection Compliance: Pill count conducted under aseptic conditions, in front of the patient. Neither the pills nor the bottle was removed from the patient's sight at any time. Once count was completed pills were immediately returned to the patient in their original bottle.  Medication: Hydrocodone/APAP Pill/Patch Count: 16 of 120 pills remain Pill/Patch Appearance: Markings consistent with prescribed medication Bottle Appearance: Standard pharmacy container. Clearly labeled. Filled Date: 10/24/2020 Last Medication intake:  Today

## 2020-11-27 ENCOUNTER — Encounter: Payer: Self-pay | Admitting: *Deleted

## 2020-11-27 ENCOUNTER — Telehealth: Payer: Self-pay | Admitting: *Deleted

## 2020-11-27 NOTE — Telephone Encounter (Signed)
Patient contacted and will be scheduled for lung screening on 12/05/20 at 330 pm. Patient is a former smoker. She stopped smoking a year/half ago. She has a h/o of smoking 1.5 packs per day x 40 years.  She has no new medical problems or signs of lung cancer. She thanked me for calling her.  Note: Her mychart is active.

## 2020-11-27 NOTE — Telephone Encounter (Signed)
[facility] MEDICAID PREPAID HEALTH PLAN/[place] MEDICAID HEALTHY Cyndee Brightly [id number] 11/12/2020   Shawn - her new insurance info

## 2020-11-28 ENCOUNTER — Other Ambulatory Visit: Payer: Self-pay | Admitting: *Deleted

## 2020-11-28 DIAGNOSIS — Z87891 Personal history of nicotine dependence: Secondary | ICD-10-CM

## 2020-11-28 DIAGNOSIS — Z122 Encounter for screening for malignant neoplasm of respiratory organs: Secondary | ICD-10-CM

## 2020-12-03 ENCOUNTER — Telehealth: Payer: Self-pay

## 2020-12-03 ENCOUNTER — Encounter: Payer: Self-pay | Admitting: *Deleted

## 2020-12-03 NOTE — Telephone Encounter (Signed)
Pt states she need a Prior Authorization sent to her new Medicaid [employer].  [employer] needs to know how long she have been on Hydrocodone  ? And how long will she continue to be on Hydrocodone.

## 2020-12-04 NOTE — Telephone Encounter (Signed)
PA sent via [facility] form with records

## 2020-12-04 NOTE — Telephone Encounter (Signed)
Patient aware.

## 2020-12-05 ENCOUNTER — Ambulatory Visit: Admission: RE | Admit: 2020-12-05 | Payer: Medicaid Other | Source: Ambulatory Visit

## 2020-12-05 ENCOUNTER — Telehealth: Payer: Self-pay | Admitting: *Deleted

## 2020-12-05 NOTE — Telephone Encounter (Signed)
Called patient to let her know that we have received another denial from [facility].  States she has just been paying for medication.  I told her that I have worked on this and sent it several ways and am unsure what the problem is.  I asked patient to please get in touch with insurance and see if they can tell her what the issue is and I will be glad to try to correct the action so that it can be approved. Patient verbalizes u/o information.

## 2020-12-09 NOTE — Telephone Encounter (Signed)
Previous appt was cancelled due to pending insurance auth. We have obtained auth. Voicemail left for patient to call back and be scheduled.

## 2020-12-10 ENCOUNTER — Telehealth: Payer: Self-pay | Admitting: *Deleted

## 2020-12-10 NOTE — Telephone Encounter (Signed)
Attempted to contact and schedule lung screening scan. Message left for patient to call back to schedule. 

## 2020-12-18 ENCOUNTER — Other Ambulatory Visit: Payer: Self-pay

## 2020-12-18 ENCOUNTER — Ambulatory Visit
Admission: RE | Admit: 2020-12-18 | Discharge: 2020-12-18 | Disposition: A | Discharge status: Home / Self Care | Payer: Medicaid Other | Source: Ambulatory Visit | Attending: Oncology | Admitting: Oncology

## 2020-12-18 DIAGNOSIS — Z122 Encounter for screening for malignant neoplasm of respiratory organs: Secondary | ICD-10-CM

## 2020-12-18 DIAGNOSIS — Z87891 Personal history of nicotine dependence: Secondary | ICD-10-CM | POA: Diagnosis present

## 2020-12-25 ENCOUNTER — Encounter: Payer: Self-pay | Admitting: *Deleted

## 2021-01-03 ENCOUNTER — Emergency Department: Payer: Medicaid Other

## 2021-01-03 ENCOUNTER — Inpatient Hospital Stay
Admission: EM | Admit: 2021-01-03 | Discharge: 2021-01-06 | DRG: 191 | Disposition: A | Discharge status: Home / Self Care | Payer: Medicaid Other | Attending: Internal Medicine | Admitting: Internal Medicine

## 2021-01-03 ENCOUNTER — Other Ambulatory Visit: Payer: Self-pay

## 2021-01-03 DIAGNOSIS — M545 Low back pain, unspecified: Secondary | ICD-10-CM | POA: Diagnosis present

## 2021-01-03 DIAGNOSIS — K219 Gastro-esophageal reflux disease without esophagitis: Secondary | ICD-10-CM | POA: Diagnosis present

## 2021-01-03 DIAGNOSIS — Z9682 Presence of neurostimulator: Secondary | ICD-10-CM

## 2021-01-03 DIAGNOSIS — J209 Acute bronchitis, unspecified: Secondary | ICD-10-CM | POA: Diagnosis present

## 2021-01-03 DIAGNOSIS — Z20822 Contact with and (suspected) exposure to covid-19: Secondary | ICD-10-CM | POA: Diagnosis present

## 2021-01-03 DIAGNOSIS — J441 Chronic obstructive pulmonary disease with (acute) exacerbation: Secondary | ICD-10-CM

## 2021-01-03 DIAGNOSIS — G8929 Other chronic pain: Secondary | ICD-10-CM | POA: Diagnosis present

## 2021-01-03 DIAGNOSIS — J9601 Acute respiratory failure with hypoxia: Secondary | ICD-10-CM | POA: Diagnosis present

## 2021-01-03 DIAGNOSIS — Z888 Allergy status to other drugs, medicaments and biological substances status: Secondary | ICD-10-CM

## 2021-01-03 DIAGNOSIS — R739 Hyperglycemia, unspecified: Secondary | ICD-10-CM | POA: Diagnosis present

## 2021-01-03 DIAGNOSIS — T380X5A Adverse effect of glucocorticoids and synthetic analogues, initial encounter: Secondary | ICD-10-CM | POA: Diagnosis present

## 2021-01-03 DIAGNOSIS — Z87891 Personal history of nicotine dependence: Secondary | ICD-10-CM

## 2021-01-03 DIAGNOSIS — Z79899 Other long term (current) drug therapy: Secondary | ICD-10-CM

## 2021-01-03 DIAGNOSIS — E785 Hyperlipidemia, unspecified: Secondary | ICD-10-CM | POA: Diagnosis present

## 2021-01-03 DIAGNOSIS — J439 Emphysema, unspecified: Principal | ICD-10-CM | POA: Diagnosis present

## 2021-01-03 DIAGNOSIS — R0902 Hypoxemia: Secondary | ICD-10-CM | POA: Diagnosis present

## 2021-01-03 DIAGNOSIS — G9059 Complex regional pain syndrome I of other specified site: Secondary | ICD-10-CM | POA: Diagnosis present

## 2021-01-03 DIAGNOSIS — I10 Essential (primary) hypertension: Secondary | ICD-10-CM | POA: Diagnosis present

## 2021-01-03 DIAGNOSIS — Z7951 Long term (current) use of inhaled steroids: Secondary | ICD-10-CM

## 2021-01-03 DIAGNOSIS — K529 Noninfective gastroenteritis and colitis, unspecified: Secondary | ICD-10-CM | POA: Diagnosis present

## 2021-01-03 DIAGNOSIS — R0603 Acute respiratory distress: Secondary | ICD-10-CM | POA: Diagnosis present

## 2021-01-03 DIAGNOSIS — I110 Hypertensive heart disease with heart failure: Secondary | ICD-10-CM | POA: Diagnosis present

## 2021-01-03 DIAGNOSIS — G894 Chronic pain syndrome: Secondary | ICD-10-CM | POA: Diagnosis present

## 2021-01-03 DIAGNOSIS — I5022 Chronic systolic (congestive) heart failure: Secondary | ICD-10-CM | POA: Diagnosis present

## 2021-01-03 DIAGNOSIS — F1120 Opioid dependence, uncomplicated: Secondary | ICD-10-CM | POA: Diagnosis present

## 2021-01-03 DIAGNOSIS — Z79891 Long term (current) use of opiate analgesic: Secondary | ICD-10-CM

## 2021-01-03 LAB — COMPREHENSIVE METABOLIC PANEL
ALT: 20 U/L (ref 0–44)
AST: 25 U/L (ref 15–41)
Albumin: 4.1 g/dL (ref 3.5–5.0)
Alkaline Phosphatase: 82 U/L (ref 38–126)
Anion gap: 9 (ref 5–15)
BUN: 22 mg/dL — ABNORMAL HIGH (ref 6–20)
CO2: 23 mmol/L (ref 22–32)
Calcium: 8.6 mg/dL — ABNORMAL LOW (ref 8.9–10.3)
Chloride: 104 mmol/L (ref 98–111)
Creatinine, Ser: 0.68 mg/dL (ref 0.44–1.00)
GFR, Estimated: >60 mL/min (ref >60)
Glucose, Bld: 124 mg/dL — ABNORMAL HIGH (ref 70–99)
Potassium: 4.1 mmol/L (ref 3.5–5.1)
Sodium: 136 mmol/L (ref 135–145)
Total Bilirubin: 0.8 mg/dL (ref 0.3–1.2)
Total Protein: 7.1 g/dL (ref 6.5–8.1)

## 2021-01-03 LAB — CBC
HCT: 36.6 % (ref 36.0–46.0)
Hemoglobin: 11.9 g/dL — ABNORMAL LOW (ref 12.0–15.0)
MCH: 29.2 pg (ref 26.0–34.0)
MCHC: 32.5 g/dL (ref 30.0–36.0)
MCV: 89.9 fL (ref 80.0–100.0)
Platelets: 213 10*3/uL (ref 150–400)
RBC: 4.07 MIL/uL (ref 3.87–5.11)
RDW: 12.3 % (ref 11.5–15.5)
WBC: 7.5 10*3/uL (ref 4.0–10.5)
nRBC: 0 % (ref 0.0–0.2)

## 2021-01-03 LAB — RESP PANEL BY RT-PCR (FLU A&B, COVID) ARPGX2
Influenza A by PCR: NEGATIVE
Influenza B by PCR: NEGATIVE
SARS Coronavirus 2 by RT PCR: NEGATIVE

## 2021-01-03 LAB — CBG MONITORING, ED: Glucose-Capillary: 180 mg/dL — ABNORMAL HIGH (ref 70–99)

## 2021-01-03 LAB — TROPONIN I (HIGH SENSITIVITY)
Troponin I (High Sensitivity): 5 ng/L (ref <18)
Troponin I (High Sensitivity): 5 ng/L (ref <18)

## 2021-01-03 LAB — MAGNESIUM: Magnesium: 2.1 mg/dL (ref 1.7–2.4)

## 2021-01-03 LAB — BRAIN NATRIURETIC PEPTIDE: B Natriuretic Peptide: 22.7 pg/mL (ref 0.0–100.0)

## 2021-01-03 LAB — TSH: TSH: 0.347 u[IU]/mL — ABNORMAL LOW (ref 0.350–4.500)

## 2021-01-03 MED ORDER — METHYLPREDNISOLONE SODIUM SUCC 125 MG IJ SOLR
125.0000 mg | Freq: Once | INTRAMUSCULAR | Status: AC
  Administered 2021-01-03: 125 mg via INTRAVENOUS
  Filled 2021-01-03: qty 2

## 2021-01-03 MED ORDER — PANTOPRAZOLE SODIUM 40 MG PO TBEC
40.0000 mg | DELAYED_RELEASE_TABLET | Freq: Every day | ORAL | Status: DC
  Administered 2021-01-04 – 2021-01-06 (×3): 40 mg via ORAL
  Filled 2021-01-03 (×3): qty 1

## 2021-01-03 MED ORDER — ONDANSETRON HCL 4 MG/2ML IJ SOLN
4.0000 mg | Freq: Four times a day (QID) | INTRAMUSCULAR | Status: DC | PRN
  Administered 2021-01-05: 4 mg via INTRAVENOUS
  Filled 2021-01-03: qty 2

## 2021-01-03 MED ORDER — INSULIN ASPART 100 UNIT/ML ~~LOC~~ SOLN
0.0000 [IU] | Freq: Three times a day (TID) | SUBCUTANEOUS | Status: DC
  Administered 2021-01-04: 3 [IU] via SUBCUTANEOUS
  Administered 2021-01-04 – 2021-01-05 (×2): 2 [IU] via SUBCUTANEOUS
  Administered 2021-01-05 (×2): 3 [IU] via SUBCUTANEOUS
  Filled 2021-01-03 (×5): qty 1

## 2021-01-03 MED ORDER — SPIRONOLACTONE 25 MG PO TABS
25.0000 mg | ORAL_TABLET | Freq: Every day | ORAL | Status: DC
  Administered 2021-01-04 – 2021-01-06 (×3): 25 mg via ORAL
  Filled 2021-01-03 (×4): qty 1

## 2021-01-03 MED ORDER — IPRATROPIUM-ALBUTEROL 0.5-2.5 (3) MG/3ML IN SOLN
RESPIRATORY_TRACT | Status: AC
  Filled 2021-01-03: qty 3

## 2021-01-03 MED ORDER — ATORVASTATIN CALCIUM 20 MG PO TABS
40.0000 mg | ORAL_TABLET | Freq: Every day | ORAL | Status: DC
Start: 2021-01-03 — End: 2021-01-06
  Administered 2021-01-03 – 2021-01-05 (×3): 40 mg via ORAL
  Filled 2021-01-03 (×3): qty 2

## 2021-01-03 MED ORDER — METHYLPREDNISOLONE SODIUM SUCC 125 MG IJ SOLR
60.0000 mg | Freq: Two times a day (BID) | INTRAMUSCULAR | Status: DC
  Administered 2021-01-04 – 2021-01-06 (×5): 60 mg via INTRAVENOUS
  Filled 2021-01-03 (×5): qty 2

## 2021-01-03 MED ORDER — INSULIN ASPART 100 UNIT/ML ~~LOC~~ SOLN: 0.0000 [IU] | Freq: Every day | SUBCUTANEOUS | Status: DC

## 2021-01-03 MED ORDER — ENOXAPARIN SODIUM 40 MG/0.4ML ~~LOC~~ SOLN
40.0000 mg | SUBCUTANEOUS | Status: DC
  Administered 2021-01-03 – 2021-01-05 (×3): 40 mg via SUBCUTANEOUS
  Filled 2021-01-03 (×3): qty 0.4

## 2021-01-03 MED ORDER — IPRATROPIUM-ALBUTEROL 0.5-2.5 (3) MG/3ML IN SOLN
3.0000 mL | Freq: Once | RESPIRATORY_TRACT | Status: AC
  Administered 2021-01-03: 3 mL via RESPIRATORY_TRACT
  Filled 2021-01-03: qty 3

## 2021-01-03 MED ORDER — ALBUTEROL SULFATE (2.5 MG/3ML) 0.083% IN NEBU
2.5000 mg | INHALATION_SOLUTION | RESPIRATORY_TRACT | Status: AC | PRN
  Administered 2021-01-03 – 2021-01-04 (×2): 2.5 mg via RESPIRATORY_TRACT
  Filled 2021-01-03 (×2): qty 3

## 2021-01-03 MED ORDER — ALBUTEROL SULFATE HFA 108 (90 BASE) MCG/ACT IN AERS
2.0000 | INHALATION_SPRAY | Freq: Four times a day (QID) | RESPIRATORY_TRACT | Status: DC | PRN
  Administered 2021-01-06: 2 via RESPIRATORY_TRACT
  Filled 2021-01-03 (×2): qty 6.7

## 2021-01-03 MED ORDER — ACETAMINOPHEN 325 MG PO TABS: 325.0000 mg | ORAL_TABLET | Freq: Four times a day (QID) | ORAL | Status: DC | PRN

## 2021-01-03 MED ORDER — ACETAMINOPHEN 650 MG RE SUPP: 325.0000 mg | Freq: Four times a day (QID) | RECTAL | Status: DC | PRN

## 2021-01-03 MED ORDER — ONDANSETRON HCL 4 MG PO TABS: 4.0000 mg | ORAL_TABLET | Freq: Four times a day (QID) | ORAL | Status: DC | PRN

## 2021-01-03 MED ORDER — HYDROCODONE-ACETAMINOPHEN 5-325 MG PO TABS
1.0000 | ORAL_TABLET | Freq: Four times a day (QID) | ORAL | Status: DC | PRN
  Administered 2021-01-04 – 2021-01-06 (×7): 1 via ORAL
  Filled 2021-01-03 (×7): qty 1

## 2021-01-03 MED ORDER — IPRATROPIUM-ALBUTEROL 0.5-2.5 (3) MG/3ML IN SOLN
3.0000 mL | Freq: Four times a day (QID) | RESPIRATORY_TRACT | Status: DC
  Administered 2021-01-04 (×2): 3 mL via RESPIRATORY_TRACT
  Filled 2021-01-03 (×2): qty 3

## 2021-01-03 MED ORDER — CARVEDILOL 3.125 MG PO TABS
3.1250 mg | ORAL_TABLET | Freq: Two times a day (BID) | ORAL | Status: DC
  Administered 2021-01-04 – 2021-01-06 (×5): 3.125 mg via ORAL
  Filled 2021-01-03 (×5): qty 1

## 2021-01-03 MED ORDER — RAMELTEON 8 MG PO TABS
8.0000 mg | ORAL_TABLET | Freq: Every day | ORAL | Status: DC
  Administered 2021-01-04: 8 mg via ORAL
  Filled 2021-01-03 (×2): qty 1

## 2021-01-03 MED ORDER — SODIUM CHLORIDE 0.9 % IV SOLN
500.0000 mg | INTRAVENOUS | Status: DC
  Administered 2021-01-03 – 2021-01-04 (×2): 500 mg via INTRAVENOUS
  Filled 2021-01-03 (×3): qty 500

## 2021-01-03 MED ORDER — FUROSEMIDE 20 MG PO TABS
20.0000 mg | ORAL_TABLET | Freq: Two times a day (BID) | ORAL | Status: DC
  Administered 2021-01-03 – 2021-01-06 (×6): 20 mg via ORAL
  Filled 2021-01-03 (×6): qty 1

## 2021-01-03 MED ORDER — MOMETASONE FURO-FORMOTEROL FUM 100-5 MCG/ACT IN AERO
2.0000 | INHALATION_SPRAY | Freq: Two times a day (BID) | RESPIRATORY_TRACT | Status: DC
  Administered 2021-01-04 – 2021-01-06 (×6): 2 via RESPIRATORY_TRACT
  Filled 2021-01-03 (×2): qty 8.8

## 2021-01-03 NOTE — ED Notes (Addendum)
RT at bedside setting up CPAP  Pharm called for meds

## 2021-01-03 NOTE — ED Provider Notes (Signed)
[facility] Emergency Department Provider Note  Time seen: 4:15 PM  I have reviewed the triage vital signs and the nursing notes.   HISTORY  Chief Complaint Shortness of Breath   HPI Elizabeth [name] is a 60 y.o. female with a past medical history of CHF, COPD, hypertension, presents emergency department for shortness of breath.  According to the patient for the past 2 days she has been feeling increasingly short of breath.  Has a mild swelling in her lower extremities as well.  Denies any chest pain or fever.  Does state increased cough.  EMS states 90 to 92% room air saturation upon arrival.  No baseline O2 requirement.  Upon arrival patient came on a nonrebreather mask satting 100%, tachypneic in the 30s sitting upright in bed with audible wheeze.   Past Medical History:  Diagnosis Date  . Anxiety   . Arm pain 07/25/2015  . CHF (congestive heart failure) (HCC)   . Congestive heart failure (HCC) 07/25/2015  . COPD (chronic obstructive pulmonary disease) (HCC)    never been idagnosed  . Depression   . Hypertension   . Lower extremity pain 07/25/2015  . Reflux   . RSD (reflex sympathetic dystrophy)   . Spinal cord stimulator status   . Vitamin B12 deficiency   . Vitamin D deficiency     Patient Active Problem List   Diagnosis Date Noted  . Complex regional pain syndrome type 1 of both lower extremities 11/20/2020  . Complex regional pain syndrome type 1 of both upper extremities 05/16/2019  . Pharmacologic therapy 05/16/2019  . Disorder of skeletal system 05/16/2019  . Problems influencing health status 05/16/2019  . Palliative care by specialist 04/07/2019  . Acute respiratory distress 04/07/2019  . HTN (hypertension) 08/24/2018  . COPD (chronic obstructive pulmonary disease) (HCC) 08/24/2018  . Lumbar spondylosis 08/22/2018  . COPD with acute bronchitis (HCC) 11/23/2017  . Disorder of bone, unspecified 08/30/2017  . Opioid-induced  constipation (OIC) 09/15/2016  . Lumbar facet syndrome (Bilateral) 03/18/2016  . 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 Chronic lower extremity pain (Bilateral) 09/23/2015  . Long term prescription opiate use 09/23/2015  . Goals of care, counseling/discussion 09/23/2015  . Elevated sedimentation rate 09/23/2015  . Chronic low back pain 07/25/2015  . Generalized anxiety disorder 07/25/2015  . Encounter for therapeutic drug level monitoring 07/25/2015  . Long term current use of opiate analgesic 07/25/2015  . Uncomplicated opioid dependence (HCC) 07/25/2015  . Opiate use (30 MME/Day) 07/25/2015  . Chronic pain syndrome 07/25/2015  . Complex regional pain syndrome I of upper limb (Bilateral) 07/25/2015  . Complex regional pain syndrome I of lower limb (Bilateral) 07/25/2015  . Opiate analgesic use agreement exists 07/25/2015  . Presence of spinal cord stimulator 07/25/2015  . COPD exacerbation (HCC) 07/25/2015  . Congestive heart failure (HCC) 07/25/2015  . Vitamin D deficiency 07/25/2015  . Vitamin B12 deficiency 07/25/2015    Past Surgical History:  Procedure Laterality Date  . SPINAL CORD STIMULATOR IMPLANT     x 2    Prior to Admission medications   Medication Sig Start Date End Date Taking? Authorizing Provider  albuterol (PROVENTIL HFA;VENTOLIN HFA) 108 (90 Base) MCG/ACT inhaler Inhale 2 puffs into the lungs every 6 (six) hours as needed for wheezing or shortness of breath. 11/26/17   Enid Baas, MD  atorvastatin (LIPITOR) 40 MG tablet Take 1 tablet (40 mg total) by mouth at bedtime. 08/18/18   Enid Baas, MD  carvedilol (  COREG) 3.125 MG tablet Take 1 tablet (3.125 mg total) by mouth 2 (two) times daily with a meal. 08/18/18   Enid Baas, MD  furosemide (LASIX) 20 MG tablet Take 1 tablet (20 mg total) by mouth daily. Patient taking differently: Take 20 mg by mouth 2 (two) times daily. 08/18/18   Enid Baas, MD  HYDROcodone-acetaminophen  (NORCO/VICODIN) 5-325 MG tablet Take 1 tablet by mouth every 6 (six) hours as needed for severe pain. Must last 30 days. 11/23/20 12/23/20  Delano Metz, MD  HYDROcodone-acetaminophen (NORCO/VICODIN) 5-325 MG tablet Take 1 tablet by mouth every 6 (six) hours as needed for severe pain. Must last 30 days. 12/23/20 01/22/21  Delano Metz, MD  HYDROcodone-acetaminophen (NORCO/VICODIN) 5-325 MG tablet Take 1 tablet by mouth every 6 (six) hours as needed for severe pain. Must last 30 days. 01/22/21 02/21/21  Delano Metz, MD  ipratropium-albuterol (DUONEB) 0.5-2.5 (3) MG/3ML SOLN Take 3 mLs by nebulization every 6 (six) hours as needed (wheezing). 08/18/18   Enid Baas, MD  mometasone-formoterol (DULERA) 100-5 MCG/ACT AERO Inhale 2 puffs into the lungs 2 (two) times daily. Patient taking differently: Inhale 1 puff into the lungs daily.  08/18/18 05/27/20  Enid Baas, MD  omeprazole (PRILOSEC) 20 MG capsule Take 20 mg by mouth daily.     [provider]  oxyCODONE (OXY IR/ROXICODONE) 5 MG immediate release tablet Take 5 mg by mouth every 6 (six) hours. Patient not taking: Reported on 11/20/2020    [provider]  polyethylene glycol (MIRALAX / GLYCOLAX) packet Take 17 g by mouth daily. Patient taking differently: Take 17 g by mouth daily as needed for mild constipation or moderate constipation. 11/27/17   Enid Baas, MD  potassium chloride SA (K-DUR,KLOR-CON) 20 MEQ tablet Take 20 mEq by mouth daily.    [provider]  ramelteon (ROZEREM) 8 MG tablet Take 8 mg by mouth at bedtime.    [provider]  spironolactone (ALDACTONE) 25 MG tablet Take by mouth. 06/29/19 06/28/20  [provider]    Allergies  Allergen Reactions  . Lisinopril Anaphylaxis    Family History  Problem Relation Age of Onset  . Cirrhosis Mother   . Cancer Father     Social History Social History   Tobacco Use  . Smoking status: Former Smoker     Packs/day: 1.50    Years: 40.00    Pack years: 60.00    Types: Cigarettes    Quit date: 04/11/2018    Years since quitting: 2.7  . Smokeless tobacco: Never Used  Vaping Use  . Vaping Use: Never used  Substance Use Topics  . Alcohol use: No    Alcohol/week: 0.0 standard drinks  . Drug use: No    Review of Systems Constitutional: Negative for fever Cardiovascular: Negative for chest pain. Respiratory: Positive shortness of breath worse x2 days.  Positive for cough. Gastrointestinal: Negative for abdominal pain, vomiting and diarrhea. Musculoskeletal: Mild lower extremity swelling per patient. Skin: Negative for skin complaints  Neurological: Negative for headache All other ROS negative  ____________________________________________   PHYSICAL EXAM:  Constitutional: Awake alert and oriented sitting upright in bed mild distress due to respiratory difficulty. Eyes: Normal exam ENT      Head: Normocephalic and atraumatic.      Mouth/Throat: Mucous membranes are moist. Cardiovascular: Normal rate, regular rhythm.  Respiratory: Moderate tachypnea around 30 breaths/min.  Expiratory wheeze in all lung fields.  No obvious rales or rhonchi. Gastrointestinal: Soft and nontender. No distention. Musculoskeletal: Nontender  with normal range of motion in all extremities.  Neurologic:  Normal speech and language. No gross focal neurologic deficits  Skin:  Skin is warm, dry and intact.  Psychiatric: Mood and affect are normal.   ____________________________________________    EKG  EKG viewed and interpreted by myself shows a normal sinus rhythm at 93 bpm with a narrow QRS, normal axis, normal intervals, no concerning ST changes  ____________________________________________    RADIOLOGY  Chest x-ray shows peribronchial thickening and bronchitic changes.  ____________________________________________   INITIAL IMPRESSION / ASSESSMENT AND PLAN / ED COURSE  Pertinent labs & imaging  results that were available during my care of the patient were reviewed by me and considered in my medical decision making (see chart for details).   Patient presents to the emergency department for worsening shortness of breath over the past 2 days.  Patient has audible wheeze on examination, mild respiratory distress with tachypnea in the mid 30s.  Patient requiring 4 L nasal cannula currently.  We will check labs including troponin and a BNP we will obtain a chest x-ray and a Covid swab.  We will treat the patient with duo nebs and Solu-Medrol while awaiting for the results.  Chest x-ray consistent peribronchial thickening.  Lab work largely nonrevealing.  Covid test negative.  Highly suspect COPD exacerbation.  Patient will be admitted to the hospital service for further work-up and treatment.  Elizabeth [name] was evaluated in Emergency Department on 01/03/2021 for the symptoms described in the history of present illness. She was evaluated in the context of the global COVID-19 pandemic, which necessitated consideration that the patient might be at risk for infection with the SARS-CoV-2 virus that causes COVID-19. Institutional protocols and algorithms that pertain to the evaluation of patients at risk for COVID-19 are in a state of rapid change based on information released by regulatory bodies including the CDC and federal and state organizations. These policies and algorithms were followed during the patient's care in the ED.  ____________________________________________   FINAL CLINICAL IMPRESSION(S) / ED DIAGNOSES  SOB COPD exacerbation   Minna Antis, MD 01/03/21 2301

## 2021-01-03 NOTE — ED Triage Notes (Signed)
Hx of CHF and COPD presents with increased SOB, home breathing treatments not helping. No home O2. Denies recent swelling or weight gain.

## 2021-01-03 NOTE — H&P (Signed)
History and Physical   Elizabeth [name] ALP:[id number] DOB: Nov 28, 1960 DOA: 01/03/2021  PCP: Lorn Junes, FNP  Patient coming from: home   I have personally briefly reviewed patient's old medical records in [facility] EMR.  Chief Concern: Shortness of breath  HPI: Elizabeth [name] is a 60 y.o. female with medical history significant for COPD, hypertension, anxiety/depression, chronic pain syndrome, presents to the emergency department for chief concerns of worsening shortness of breath.  Patient states that the shortness of breath has been ongoing for the last 3 to 4 days.  She denies new medications, sick contacts.  She endorses productive cough and denies blood.  She also endorses nausea and vomiting every time she coughs and states that she vomits up yellow substance.  She also endorses diarrhea.  She denies changes to her diet and reports that none of her family members or people she has been in contact with has the symptoms.  She denies baseline use of oxygen.  She reports that she has felt this way before and states that is similar to her previous COPD exacerbation.  She states that she has been admitted to the critical care unit before and placed on BiPAP due to COPD.  She states that she has never been intubated.  Social history: Lives at home with her son.  She is a former tobacco user and quit 2 years ago.  Formally she smoked 1 pack/day.  She denies EtOH and recreational drug use.  Vaccination: Patient is not vaccinated for COVID-19  Allergies: Lisinopril, anaphylactic  ROS: Constitutional: no weight change, no fever ENT/Mouth: no sore throat, no rhinorrhea Eyes: no eye pain, no vision changes Cardiovascular: no chest pain, + dyspnea,  no edema, no palpitations Respiratory: + cough, no sputum, + wheezing Gastrointestinal: no nausea, no vomiting, no diarrhea, no constipation Genitourinary: no urinary incontinence, no dysuria, no hematuria Musculoskeletal: no  arthralgias, no myalgias Skin: no skin lesions, no pruritus, Neuro: + weakness, no loss of consciousness, no syncope Psych: no anxiety, no depression, + decrease appetite Heme/Lymph: no bruising, no bleeding  ED Course: Discussed with ED provider, patient requiring hospitalization due to COPD exacerbation.  Vitals in the emergency department showed a temperature of 98.5, respiration rate of 21, heart rate of 93, blood pressure 109/66.  Labs in the emergency department was remarkable for serum creatinine of 0.68, nonfasting blood glucose 124, sodium 136, potassium 4.1, chloride 104, bicarb 23, WBC 7.5, hemoglobin 11.9, platelets 213, troponin 5x2.  BNP 22.7.  Emergency medicine provider treated patient with 2 doses of DuoNeb's, Solu-Medrol 125 mg IV.  Assessment/Plan  Principal Problem:   COPD exacerbation (HCC) Active Problems:   Chronic low back pain   Uncomplicated opioid dependence (HCC)   Chronic pain syndrome   Long term prescription opiate use   Acute respiratory distress   HTN (hypertension)   History of tobacco abuse   Gastroenteritis   COPD exacerbation COPD secondary to tobacco abuse -Etiology work-up in in progress at this time -Resumed home Dulera 2 puff twice daily, albuterol inhaler as needed -Status post 2 doses of DuoNeb's treatment and Solu-Medrol 125 mg per ED provider -Checking magnesium -Continue Solu-Medrol at 60 mg IV every 12 hours, scheduled duo nebs every 6 hours, albuterol nebulizer as needed for wheezing -CPAP nightly ordered -Incentive spirometry, flutter valve -Checking pro-Cal today and tomorrow ordered  Anticipate hyperglycemia due to steroid-insulin sliding scale with at bedtime coverage ordered  Hyperlipidemia-atorvastatin 40 mg nightly  Hypertension-carvedilol 3.125 mg twice daily  History of heart failure reduced EF -the patient appears to be euvolemic at this time, resumed carvedilol, spironolactone, as prescribed -Patient follows with  Dr. Lady GaryFath and ACE/ARB has been deferred due to anaphylaxis secondary to lisinopril  TSH is low, possible subclinical hypothyroid -A.m. free T4, T4 ordered  History of tobacco abuse-quit 2 years ago  Chart reviewed.   As needed meds: Acetaminophen, Norco, ondansetron  Cardiac echo on 04/13/2018: Read as EF of 40%, hypokinesis of the anteroseptal myocardium, grade 1 diastolic dysfunction  DVT prophylaxis: Enoxaparin 40 mg subcutaneous every 24 hours Code Status: Full code Diet: Heart healthy Family Communication: Updated son at bedside Disposition Plan: Pending clinical course Consults called: None at this time Admission status: Observation, progressive cardiac, with telemetry  Past Medical History:  Diagnosis Date  . Anxiety   . Arm pain 07/25/2015  . CHF (congestive heart failure) (HCC)   . Congestive heart failure (HCC) 07/25/2015  . COPD (chronic obstructive pulmonary disease) (HCC)    never been idagnosed  . Depression   . Hypertension   . Lower extremity pain 07/25/2015  . Reflux   . RSD (reflex sympathetic dystrophy)   . Spinal cord stimulator status   . Vitamin B12 deficiency   . Vitamin D deficiency    Past Surgical History:  Procedure Laterality Date  . SPINAL CORD STIMULATOR IMPLANT     x 2   Social History:  reports that she quit smoking about 2 years ago. Her smoking use included cigarettes. She has a 60.00 pack-year smoking history. She has never used smokeless tobacco. She reports that she does not drink alcohol and does not use drugs.  Allergies  Allergen Reactions  . Lisinopril Anaphylaxis   Family History  Problem Relation Age of Onset  . Cirrhosis Mother   . Cancer Father    Family history: Family history reviewed and not pertinent  Prior to Admission medications   Medication Sig Start Date End Date Taking? Authorizing Provider  albuterol (PROVENTIL HFA;VENTOLIN HFA) 108 (90 Base) MCG/ACT inhaler Inhale 2 puffs into the lungs every 6 (six) hours  as needed for wheezing or shortness of breath. 11/26/17   Enid BaasKalisetti, Radhika, MD  atorvastatin (LIPITOR) 40 MG tablet Take 1 tablet (40 mg total) by mouth at bedtime. 08/18/18   Enid BaasKalisetti, Radhika, MD  carvedilol (COREG) 3.125 MG tablet Take 1 tablet (3.125 mg total) by mouth 2 (two) times daily with a meal. 08/18/18   Enid BaasKalisetti, Radhika, MD  furosemide (LASIX) 20 MG tablet Take 1 tablet (20 mg total) by mouth daily. Patient taking differently: Take 20 mg by mouth 2 (two) times daily. 08/18/18   Enid BaasKalisetti, Radhika, MD  HYDROcodone-acetaminophen (NORCO/VICODIN) 5-325 MG tablet Take 1 tablet by mouth every 6 (six) hours as needed for severe pain. Must last 30 days. 11/23/20 12/23/20  Delano MetzNaveira, Francisco, MD  HYDROcodone-acetaminophen (NORCO/VICODIN) 5-325 MG tablet Take 1 tablet by mouth every 6 (six) hours as needed for severe pain. Must last 30 days. 12/23/20 01/22/21  Delano MetzNaveira, Francisco, MD  HYDROcodone-acetaminophen (NORCO/VICODIN) 5-325 MG tablet Take 1 tablet by mouth every 6 (six) hours as needed for severe pain. Must last 30 days. 01/22/21 02/21/21  Delano MetzNaveira, Francisco, MD  ipratropium-albuterol (DUONEB) 0.5-2.5 (3) MG/3ML SOLN Take 3 mLs by nebulization every 6 (six) hours as needed (wheezing). 08/18/18   Enid BaasKalisetti, Radhika, MD  mometasone-formoterol (DULERA) 100-5 MCG/ACT AERO Inhale 2 puffs into the lungs 2 (two) times daily. Patient taking differently: Inhale 1 puff into the lungs daily.  08/18/18 05/27/20  Enid Baas, MD  omeprazole (PRILOSEC) 20 MG capsule Take 20 mg by mouth daily.     [provider]  oxyCODONE (OXY IR/ROXICODONE) 5 MG immediate release tablet Take 5 mg by mouth every 6 (six) hours. Patient not taking: Reported on 11/20/2020    [provider]  polyethylene glycol (MIRALAX / GLYCOLAX) packet Take 17 g by mouth daily. Patient taking differently: Take 17 g by mouth daily as needed for mild constipation or moderate constipation. 11/27/17   Enid Baas, MD   potassium chloride SA (K-DUR,KLOR-CON) 20 MEQ tablet Take 20 mEq by mouth daily.    [provider]  ramelteon (ROZEREM) 8 MG tablet Take 8 mg by mouth at bedtime.    [provider]  spironolactone (ALDACTONE) 25 MG tablet Take by mouth. 06/29/19 06/28/20  [provider]   Physical Exam: Vitals:   01/03/21 1800 01/03/21 2017 01/03/21 2100 01/03/21 2217  BP: 109/66 119/78 115/81 108/68  Pulse: 93 98  96  Resp: (!) 29 (!) 23 (!) 27 (!) 24  Temp:  99.8 F (37.7 C)  98.8 F (37.1 C)  TempSrc:  Oral  Oral  SpO2: 96%   93%  Weight:      Height:       Constitutional: appears older than chronological age, NAD, calm, comfortable Eyes: PERRL, lids and conjunctivae normal ENMT: Mucous membranes are moist. Posterior pharynx clear of any exudate or lesions. Age-appropriate dentition. Hearing appropriate Neck: normal, supple, no masses, no thyromegaly Respiratory: clear to auscultation bilaterally, no wheezing, no crackles.  Increased respiratory effort.  Increase accessory muscle use.  Cardiovascular: Regular rate and rhythm, no murmurs / rubs / gallops. No extremity edema. 2+ pedal pulses. No carotid bruits.  Abdomen: no tenderness, no masses palpated, no hepatosplenomegaly. Bowel sounds positive.  Musculoskeletal: no clubbing / cyanosis. No joint deformity upper and lower extremities. Good ROM, no contractures, no atrophy. Normal muscle tone.  Skin: no rashes, lesions, ulcers. No induration Neurologic: Sensation intact. Strength 5/5 in all 4.  Psychiatric: Normal judgment and insight. Alert and oriented x 3. Normal mood.   EKG: independently reviewed, showing Sinus rhythm with rate of 93, QTc 477  Chest x-ray on Admission: I personally reviewed and I agree with radiologist reading as below.  DG Chest Portable 1 View  Result Date: 01/03/2021 CLINICAL DATA:  Shortness of breath. EXAM: PORTABLE CHEST 1 VIEW COMPARISON:  Most recent radiograph 08/14/2018.  CT  12/18/2020 FINDINGS: Normal heart size and mediastinal contours for technique. Peribronchial thickening and bronchitic change. Slight blunting of right costophrenic angle. No confluent consolidation. No pneumothorax. Stimulator in place with tips at the level of the thoracic inlet. No acute osseous abnormalities are seen. IMPRESSION: 1. Peribronchial thickening and bronchitic change, slightly increased from prior exam. Suspect acute bronchitis. 2. Minimal blunting of right costophrenic angle which may be due to scarring or small effusion. Electronically Signed   By: Narda Rutherford M.D.   On: 01/03/2021 16:55   Labs on Admission: I have personally reviewed following labs  CBC: Recent Labs  Lab 01/03/21 1622  WBC 7.5  HGB 11.9*  HCT 36.6  MCV 89.9  PLT 213   Basic Metabolic Panel: Recent Labs  Lab 01/03/21 1622 01/03/21 1832  NA 136  --   K 4.1  --   CL 104  --   CO2 23  --   GLUCOSE 124*  --   BUN 22*  --   CREATININE 0.68  --   CALCIUM 8.6*  --  MG  --  2.1   GFR: Estimated Creatinine Clearance: 56.1 mL/min (by C-G formula based on SCr of 0.68 mg/dL).  Liver Function Tests: Recent Labs  Lab 01/03/21 1622  AST 25  ALT 20  ALKPHOS 82  BILITOT 0.8  PROT 7.1  ALBUMIN 4.1   Urine analysis:    Component Value Date/Time   COLORURINE STRAW (A) 05/24/2018 1150   APPEARANCEUR CLEAR (A) 05/24/2018 1150   LABSPEC 1.005 05/24/2018 1150   PHURINE 5.0 05/24/2018 1150   GLUCOSEU NEGATIVE 05/24/2018 1150   HGBUR NEGATIVE 05/24/2018 1150   BILIRUBINUR NEGATIVE 05/24/2018 1150   KETONESUR NEGATIVE 05/24/2018 1150   PROTEINUR NEGATIVE 05/24/2018 1150   NITRITE NEGATIVE 05/24/2018 1150   LEUKOCYTESUR TRACE (A) 05/24/2018 1150   Amy N Cox D.O. [facility]  If 7PM-7AM, please contact overnight-coverage provider If 7AM-7PM, please contact day coverage provider [email or web address]  01/03/2021, 10:30 PM

## 2021-01-04 ENCOUNTER — Encounter: Payer: Self-pay | Admitting: Internal Medicine

## 2021-01-04 DIAGNOSIS — R739 Hyperglycemia, unspecified: Secondary | ICD-10-CM | POA: Diagnosis present

## 2021-01-04 DIAGNOSIS — J209 Acute bronchitis, unspecified: Secondary | ICD-10-CM | POA: Diagnosis present

## 2021-01-04 DIAGNOSIS — Z87891 Personal history of nicotine dependence: Secondary | ICD-10-CM | POA: Diagnosis not present

## 2021-01-04 DIAGNOSIS — J441 Chronic obstructive pulmonary disease with (acute) exacerbation: Secondary | ICD-10-CM

## 2021-01-04 DIAGNOSIS — T380X5A Adverse effect of glucocorticoids and synthetic analogues, initial encounter: Secondary | ICD-10-CM | POA: Diagnosis present

## 2021-01-04 DIAGNOSIS — G9059 Complex regional pain syndrome I of other specified site: Secondary | ICD-10-CM | POA: Diagnosis present

## 2021-01-04 DIAGNOSIS — Z888 Allergy status to other drugs, medicaments and biological substances status: Secondary | ICD-10-CM | POA: Diagnosis not present

## 2021-01-04 DIAGNOSIS — G894 Chronic pain syndrome: Secondary | ICD-10-CM | POA: Diagnosis present

## 2021-01-04 DIAGNOSIS — R0603 Acute respiratory distress: Secondary | ICD-10-CM | POA: Diagnosis present

## 2021-01-04 DIAGNOSIS — I110 Hypertensive heart disease with heart failure: Secondary | ICD-10-CM | POA: Diagnosis present

## 2021-01-04 DIAGNOSIS — J439 Emphysema, unspecified: Secondary | ICD-10-CM | POA: Diagnosis present

## 2021-01-04 DIAGNOSIS — Z9682 Presence of neurostimulator: Secondary | ICD-10-CM | POA: Diagnosis not present

## 2021-01-04 DIAGNOSIS — Z7951 Long term (current) use of inhaled steroids: Secondary | ICD-10-CM | POA: Diagnosis not present

## 2021-01-04 DIAGNOSIS — Z79899 Other long term (current) drug therapy: Secondary | ICD-10-CM | POA: Diagnosis not present

## 2021-01-04 DIAGNOSIS — I5022 Chronic systolic (congestive) heart failure: Secondary | ICD-10-CM | POA: Diagnosis present

## 2021-01-04 DIAGNOSIS — K219 Gastro-esophageal reflux disease without esophagitis: Secondary | ICD-10-CM | POA: Diagnosis present

## 2021-01-04 DIAGNOSIS — Z20822 Contact with and (suspected) exposure to covid-19: Secondary | ICD-10-CM | POA: Diagnosis present

## 2021-01-04 DIAGNOSIS — F1120 Opioid dependence, uncomplicated: Secondary | ICD-10-CM | POA: Diagnosis present

## 2021-01-04 DIAGNOSIS — K529 Noninfective gastroenteritis and colitis, unspecified: Secondary | ICD-10-CM | POA: Diagnosis present

## 2021-01-04 DIAGNOSIS — E785 Hyperlipidemia, unspecified: Secondary | ICD-10-CM | POA: Diagnosis present

## 2021-01-04 DIAGNOSIS — R0902 Hypoxemia: Secondary | ICD-10-CM | POA: Diagnosis present

## 2021-01-04 LAB — BASIC METABOLIC PANEL
Anion gap: 10 (ref 5–15)
BUN: 23 mg/dL — ABNORMAL HIGH (ref 6–20)
CO2: 23 mmol/L (ref 22–32)
Calcium: 8.7 mg/dL — ABNORMAL LOW (ref 8.9–10.3)
Chloride: 106 mmol/L (ref 98–111)
Creatinine, Ser: 0.75 mg/dL (ref 0.44–1.00)
GFR, Estimated: >60 mL/min (ref >60)
Glucose, Bld: 134 mg/dL — ABNORMAL HIGH (ref 70–99)
Potassium: 3.3 mmol/L — ABNORMAL LOW (ref 3.5–5.1)
Sodium: 139 mmol/L (ref 135–145)

## 2021-01-04 LAB — CBC
HCT: 31.9 % — ABNORMAL LOW (ref 36.0–46.0)
Hemoglobin: 10.2 g/dL — ABNORMAL LOW (ref 12.0–15.0)
MCH: 28.9 pg (ref 26.0–34.0)
MCHC: 32 g/dL (ref 30.0–36.0)
MCV: 90.4 fL (ref 80.0–100.0)
Platelets: 198 10*3/uL (ref 150–400)
RBC: 3.53 MIL/uL — ABNORMAL LOW (ref 3.87–5.11)
RDW: 12.3 % (ref 11.5–15.5)
WBC: 5.3 10*3/uL (ref 4.0–10.5)
nRBC: 0 % (ref 0.0–0.2)

## 2021-01-04 LAB — GLUCOSE, CAPILLARY
Glucose-Capillary: 166 mg/dL — ABNORMAL HIGH (ref 70–99)
Glucose-Capillary: 144 mg/dL — ABNORMAL HIGH (ref 70–99)

## 2021-01-04 LAB — CBG MONITORING, ED
Glucose-Capillary: 140 mg/dL — ABNORMAL HIGH (ref 70–99)
Glucose-Capillary: 149 mg/dL — ABNORMAL HIGH (ref 70–99)

## 2021-01-04 LAB — HEMOGLOBIN A1C
Hgb A1c MFr Bld: 5.9 % — ABNORMAL HIGH (ref 4.8–5.6)
Mean Plasma Glucose: 122.63 mg/dL

## 2021-01-04 LAB — PROCALCITONIN
Procalcitonin: 0.28 ng/mL
Procalcitonin: 0.16 ng/mL

## 2021-01-04 LAB — T4, FREE: Free T4: 0.68 ng/dL (ref 0.61–1.12)

## 2021-01-04 LAB — HIV ANTIBODY (ROUTINE TESTING W REFLEX): HIV Screen 4th Generation wRfx: NONREACTIVE

## 2021-01-04 MED ORDER — IPRATROPIUM-ALBUTEROL 0.5-2.5 (3) MG/3ML IN SOLN
3.0000 mL | Freq: Three times a day (TID) | RESPIRATORY_TRACT | Status: DC
  Administered 2021-01-04 – 2021-01-06 (×6): 3 mL via RESPIRATORY_TRACT
  Filled 2021-01-04 (×6): qty 3

## 2021-01-04 MED ORDER — ALPRAZOLAM 0.25 MG PO TABS
0.2500 mg | ORAL_TABLET | Freq: Two times a day (BID) | ORAL | Status: DC | PRN
  Administered 2021-01-04 – 2021-01-05 (×3): 0.25 mg via ORAL
  Filled 2021-01-04 (×3): qty 1

## 2021-01-04 MED ORDER — TRAZODONE HCL 50 MG PO TABS
50.0000 mg | ORAL_TABLET | Freq: Every evening | ORAL | Status: DC | PRN
  Administered 2021-01-04: 50 mg via ORAL
  Filled 2021-01-04: qty 1

## 2021-01-04 NOTE — Plan of Care (Signed)

## 2021-01-04 NOTE — Progress Notes (Signed)
[facility]  - [place] at [facility]   PATIENT NAME: Elizabeth [name]    MR#:  [id number]  DATE OF BIRTH:  [birth year]  SUBJECTIVE:  Came in with increasing wheezing and sob. Cough+  no fever  REVIEW OF SYSTEMS:   Review of Systems  Constitutional: Positive for malaise/fatigue. Negative for chills, fever and weight loss.  HENT: Negative for ear discharge, ear pain and nosebleeds.   Eyes: Negative for blurred vision, pain and discharge.  Respiratory: Positive for cough, shortness of breath and wheezing. Negative for sputum production and stridor.   Cardiovascular: Negative for chest pain, palpitations, orthopnea and PND.  Gastrointestinal: Negative for abdominal pain, diarrhea, nausea and vomiting.  Genitourinary: Negative for frequency and urgency.  Musculoskeletal: Negative for back pain and joint pain.  Neurological: Positive for weakness. Negative for sensory change, speech change and focal weakness.  Psychiatric/Behavioral: Negative for depression and hallucinations. The patient is not nervous/anxious.    Tolerating Diet:yes Tolerating PT:   DRUG ALLERGIES:   Allergies  Allergen Reactions  . Lisinopril Anaphylaxis    VITALS:  Blood pressure 109/68, pulse 84, temperature 97.6 F (36.4 C), temperature source Oral, resp. rate 18, height 4\' 9"  (1.448 m), weight 58.2 kg, SpO2 92 %.  PHYSICAL EXAMINATION:   Physical Exam  GENERAL:  60 y.o.-year-old patient lying in the bed with no acute distress. Looks chronically ill LUNGS: coarse breath sounds bilaterally, ++ wheezing, no rales, rhonchi. No use of accessory muscles of respiration.  CARDIOVASCULAR: S1, S2 normal. No murmurs, rubs, or gallops.  ABDOMEN: Soft, nontender, nondistended. Bowel sounds present. No organomegaly or mass.  EXTREMITIES: No cyanosis, clubbing or edema b/l.    NEUROLOGIC: Cranial nerves II through XII are intact. No focal Motor or sensory deficits b/l.   PSYCHIATRIC:  patient is alert  and oriented x 3.  SKIN: No obvious rash, lesion, or ulcer.   LABORATORY PANEL:  CBC Recent Labs  Lab 01/04/21 0629  WBC 5.3  HGB 10.2*  HCT 31.9*  PLT 198    Chemistries  Recent Labs  Lab 01/03/21 1622 01/03/21 1832 01/04/21 0629  NA 136  --  139  K 4.1  --  3.3*  CL 104  --  106  CO2 23  --  23  GLUCOSE 124*  --  134*  BUN 22*  --  23*  CREATININE 0.68  --  0.75  CALCIUM 8.6*  --  8.7*  MG  --  2.1  --   AST 25  --   --   ALT 20  --   --   ALKPHOS 82  --   --   BILITOT 0.8  --   --    Cardiac Enzymes No results for input(s): TROPONINI in the last 168 hours. RADIOLOGY:  DG Chest Portable 1 View  Result Date: 01/03/2021 CLINICAL DATA:  Shortness of breath. EXAM: PORTABLE CHEST 1 VIEW COMPARISON:  Most recent radiograph 08/14/2018.  CT 12/18/2020 FINDINGS: Normal heart size and mediastinal contours for technique. Peribronchial thickening and bronchitic change. Slight blunting of right costophrenic angle. No confluent consolidation. No pneumothorax. Stimulator in place with tips at the level of the thoracic inlet. No acute osseous abnormalities are seen. IMPRESSION: 1. Peribronchial thickening and bronchitic change, slightly increased from prior exam. Suspect acute bronchitis. 2. Minimal blunting of right costophrenic angle which may be due to scarring or small effusion. Electronically Signed   By: 02/17/2021 M.D.   On: 01/03/2021 16:55   ASSESSMENT AND  PLAN:   Elizabeth [name] is a 60 y.o. female with medical history significant for COPD, hypertension, anxiety/depression, chronic pain syndrome, presents to the emergency department for chief concerns of worsening shortness of breath. Patient states that the shortness of breath has been ongoing for the last 3 to 4 days.  She denies new medications, sick contacts.  She endorses productive cough.   COPD exacerbation, acute on chornic emphysema -Resumed home Dulera 2 puff twice daily, albuterol inhaler as  needed -Continue Solu-Medrol at 60 mg IV every 12 hours, scheduled duo nebs every 6 hours, albuterol nebulizer as needed for wheezing -CPAP nightly ordered -Incentive spirometry, flutter valve -procalcitonin 0.28--0.16 --empiric zithromax --CXR acute bronchitis changes and pt has productive cough   hyperglycemia due to steroid-insulin sliding scale with at bedtime coverage ordered  Hyperlipidemia-atorvastatin   Hypertension-carvedilol   History of heart failure chronic systolic  -the patient appears to be euvolemic at this time, resumed carvedilol, spironolactone, as prescribed -Patient follows with Dr. Lady Gary and ACE/ARB has been deferred due to anaphylaxis secondary to lisinopril  History of tobacco abuse-quit 2 years ago  Family communication :none Consults : CODE STATUS: full DVT Prophylaxis :lovenox Level of care: Progressive Cardiac Status XT:[id number]   Dispo: The patient is from: Home              Anticipated d/c is to: Home              Patient currently is not medically stable to d/c.   Difficult to place patient No  treatment for COPD exacerbation       TOTAL TIME TAKING CARE OF THIS PATIENT: 25 minutes.  >50% time spent on counselling and coordination of care  Note: This dictation was prepared with Dragon dictation along with smaller phrase technology. Any transcriptional errors that result from this process are unintentional.  Enedina Finner M.D    [facility]   CC: Primary care physician; Lorn Junes, FNPPatient ID: Elizabeth [name], female   DOB: 06/16/1961, 60 y.o.   MRN: [id number]

## 2021-01-04 NOTE — ED Notes (Signed)
Warm blanket given, pt CPAP adjusted for comfort

## 2021-01-04 NOTE — ED Notes (Signed)
Pct  Bloodwork not collected by prior shift

## 2021-01-05 ENCOUNTER — Other Ambulatory Visit: Payer: Self-pay

## 2021-01-05 DIAGNOSIS — J441 Chronic obstructive pulmonary disease with (acute) exacerbation: Secondary | ICD-10-CM | POA: Diagnosis not present

## 2021-01-05 LAB — GLUCOSE, CAPILLARY
Glucose-Capillary: 161 mg/dL — ABNORMAL HIGH (ref 70–99)
Glucose-Capillary: 138 mg/dL — ABNORMAL HIGH (ref 70–99)
Glucose-Capillary: 137 mg/dL — ABNORMAL HIGH (ref 70–99)
Glucose-Capillary: 134 mg/dL — ABNORMAL HIGH (ref 70–99)

## 2021-01-05 LAB — PROCALCITONIN: Procalcitonin: <0.1 ng/mL

## 2021-01-05 MED ORDER — DIMENHYDRINATE 50 MG PO TABS
50.0000 mg | ORAL_TABLET | Freq: Every day | ORAL | Status: DC
  Administered 2021-01-05: 50 mg via ORAL
  Filled 2021-01-05: qty 1

## 2021-01-05 MED ORDER — AZITHROMYCIN 250 MG PO TABS
500.0000 mg | ORAL_TABLET | Freq: Every day | ORAL | Status: DC
  Administered 2021-01-05 – 2021-01-06 (×2): 500 mg via ORAL
  Filled 2021-01-05 (×2): qty 2

## 2021-01-05 NOTE — Progress Notes (Signed)
[facility]  - [place] at [facility]   PATIENT NAME: Elizabeth [name]    MR#:  [id number]  DATE OF BIRTH:  [birth year]  SUBJECTIVE:  Came in with increasing wheezing and sob. Cough+  no fever Did not sleep well Son in the room  REVIEW OF SYSTEMS:   Review of Systems  Constitutional: Positive for malaise/fatigue. Negative for chills, fever and weight loss.  HENT: Negative for ear discharge, ear pain and nosebleeds.   Eyes: Negative for blurred vision, pain and discharge.  Respiratory: Positive for cough and shortness of breath. Negative for sputum production and stridor.   Cardiovascular: Negative for chest pain, palpitations, orthopnea and PND.  Gastrointestinal: Negative for abdominal pain, diarrhea, nausea and vomiting.  Genitourinary: Negative for frequency and urgency.  Musculoskeletal: Negative for back pain and joint pain.  Neurological: Positive for weakness. Negative for sensory change, speech change and focal weakness.  Psychiatric/Behavioral: Negative for depression and hallucinations. The patient is not nervous/anxious.    Tolerating Diet:yes Tolerating PT:   DRUG ALLERGIES:   Allergies  Allergen Reactions  . Lisinopril Anaphylaxis    VITALS:  Blood pressure (!) 104/59, pulse 76, temperature 98.4 F (36.9 C), temperature source Oral, resp. rate 18, height 4\' 9"  (1.448 m), weight 58.2 kg, SpO2 93 %.  PHYSICAL EXAMINATION:   Physical Exam  GENERAL:  60 y.o.-year-old patient lying in the bed with no acute distress. Looks chronically ill LUNGS: coarse breath sounds bilaterally, +wheezing, no rales, rhonchi. No use of accessory muscles of respiration.  CARDIOVASCULAR: S1, S2 normal. No murmurs, rubs, or gallops.  ABDOMEN: Soft, nontender, nondistended. Bowel sounds present. No organomegaly or mass.  EXTREMITIES: No cyanosis, clubbing or edema b/l.    NEUROLOGIC: Cranial nerves II through XII are intact. No focal Motor or sensory deficits b/l.    PSYCHIATRIC:  patient is alert and oriented x 3.  SKIN: No obvious rash, lesion, or ulcer.   LABORATORY PANEL:  CBC Recent Labs  Lab 01/04/21 0629  WBC 5.3  HGB 10.2*  HCT 31.9*  PLT 198    Chemistries  Recent Labs  Lab 01/03/21 1622 01/03/21 1832 01/04/21 0629  NA 136  --  139  K 4.1  --  3.3*  CL 104  --  106  CO2 23  --  23  GLUCOSE 124*  --  134*  BUN 22*  --  23*  CREATININE 0.68  --  0.75  CALCIUM 8.6*  --  8.7*  MG  --  2.1  --   AST 25  --   --   ALT 20  --   --   ALKPHOS 82  --   --   BILITOT 0.8  --   --    Cardiac Enzymes No results for input(s): TROPONINI in the last 168 hours. RADIOLOGY:  DG Chest Portable 1 View  Result Date: 01/03/2021 CLINICAL DATA:  Shortness of breath. EXAM: PORTABLE CHEST 1 VIEW COMPARISON:  Most recent radiograph 08/14/2018.  CT 12/18/2020 FINDINGS: Normal heart size and mediastinal contours for technique. Peribronchial thickening and bronchitic change. Slight blunting of right costophrenic angle. No confluent consolidation. No pneumothorax. Stimulator in place with tips at the level of the thoracic inlet. No acute osseous abnormalities are seen. IMPRESSION: 1. Peribronchial thickening and bronchitic change, slightly increased from prior exam. Suspect acute bronchitis. 2. Minimal blunting of right costophrenic angle which may be due to scarring or small effusion. Electronically Signed   By: 02/17/2021.D.  On: 01/03/2021 16:55   ASSESSMENT AND PLAN:   Elizabeth [name] is a 60 y.o. female with medical history significant for COPD, hypertension, anxiety/depression, chronic pain syndrome, presents to the emergency department for chief concerns of worsening shortness of breath. Patient states that the shortness of breath has been ongoing for the last 3 to 4 days.  She denies new medications, sick contacts.  She endorses productive cough.   COPD exacerbation, acute on chornic emphysema -Resumed home Dulera 2 puff twice  daily, albuterol inhaler as needed -Continue Solu-Medrol at 60 mg IV every 12 hours, scheduled duo nebs every 6 hours, albuterol nebulizer as needed for wheezing -CPAP nightly ordered -Incentive spirometry, flutter valve -procalcitonin 0.28--0.16 --empiric zithromax --CXR acute bronchitis changes and pt has productive cough   hyperglycemia due to steroid-insulin sliding scale with at bedtime coverage ordered  Hyperlipidemia-atorvastatin   Hypertension-carvedilol   History of heart failure chronic systolic  -the patient appears to be euvolemic at this time, resumed carvedilol, spironolactone, as prescribed -Patient follows with Dr. Lady Gary and ACE/ARB has been deferred due to anaphylaxis secondary to lisinopril  History of tobacco abuse-quit 2 years ago  Family communication : son in the room Consults : CODE STATUS: full DVT Prophylaxis :lovenox Level of care: Progressive Cardiac Status ZO:[id number]   Dispo: The patient is from: Home              Anticipated d/c is to: Home              Patient currently is not medically stable to d/c.   Difficult to place patient No  treatment for COPD exacerbation       TOTAL TIME TAKING CARE OF THIS PATIENT: 25 minutes.  >50% time spent on counselling and coordination of care  Note: This dictation was prepared with Dragon dictation along with smaller phrase technology. Any transcriptional errors that result from this process are unintentional.  Enedina Finner M.D    [facility]   CC: Primary care physician; Elizabeth [name], FNPPatient ID: Elizabeth [name], female   DOB: July 15, 1961, 60 y.o.   MRN: [id number]

## 2021-01-06 DIAGNOSIS — J441 Chronic obstructive pulmonary disease with (acute) exacerbation: Secondary | ICD-10-CM | POA: Diagnosis not present

## 2021-01-06 LAB — GLUCOSE, CAPILLARY
Glucose-Capillary: 116 mg/dL — ABNORMAL HIGH (ref 70–99)
Glucose-Capillary: 149 mg/dL — ABNORMAL HIGH (ref 70–99)

## 2021-01-06 MED ORDER — PREDNISONE 50 MG PO TABS: 50.0000 mg | ORAL_TABLET | Freq: Every day | ORAL | Status: DC

## 2021-01-06 MED ORDER — PREDNISONE 50 MG PO TABS
50.0000 mg | ORAL_TABLET | Freq: Every day | ORAL | Status: DC
  Administered 2021-01-06: 50 mg via ORAL
  Filled 2021-01-06: qty 1

## 2021-01-06 MED ORDER — AZITHROMYCIN 250 MG PO TABS: ORAL_TABLET | ORAL | 0 refills | Status: DC

## 2021-01-06 MED ORDER — IPRATROPIUM-ALBUTEROL 0.5-2.5 (3) MG/3ML IN SOLN: 3.0000 mL | Freq: Four times a day (QID) | RESPIRATORY_TRACT | 3 refills | Status: DC | PRN

## 2021-01-06 MED ORDER — PREDNISONE 10 MG PO TABS: ORAL_TABLET | ORAL | 0 refills | Status: DC

## 2021-01-06 NOTE — Progress Notes (Signed)
SATURATION QUALIFICATIONS: (This note is used to comply with regulatory documentation for home oxygen)  Patient Saturations on Room Air at Rest = 90%  Patient Saturations on Room Air while Ambulating = 87%  Patient Saturations on 2 Liters of oxygen while Ambulating = 92%  Please briefly explain why patient needs home oxygen: patient O2 drops with minimal exertion.

## 2021-01-06 NOTE — Discharge Instructions (Signed)
Pt advised to use inhalers and nebs as before

## 2021-01-06 NOTE — Discharge Summary (Addendum)
[facility] - [place] at [facility]   PATIENT NAME: Elizabeth [name]    MR#:  [id number]  DATE OF BIRTH:  September 07, 1961  DATE OF ADMISSION:  01/03/2021 ADMITTING PHYSICIAN: Amy N Cox, DO  DATE OF DISCHARGE: 01/06/2021  PRIMARY CARE PHYSICIAN: Lorn Junes, FNP    ADMISSION DIAGNOSIS:  COPD exacerbation (HCC) [J44.1]  DISCHARGE DIAGNOSIS:  Acute on Chronic COPD flare with hypoxia now on oxygen Acute Bronchitis  SECONDARY DIAGNOSIS:   Past Medical History:  Diagnosis Date  . Anxiety   . Arm pain 07/25/2015  . CHF (congestive heart failure) (HCC)   . Congestive heart failure (HCC) 07/25/2015  . COPD (chronic obstructive pulmonary disease) (HCC)    never been idagnosed  . Depression   . Hypertension   . Lower extremity pain 07/25/2015  . Reflux   . RSD (reflex sympathetic dystrophy)   . Spinal cord stimulator status   . Vitamin B12 deficiency   . Vitamin D deficiency     HOSPITAL COURSE:   Elizabeth Ballard Hudginsis a 60 y.o.femalewith medical history significant forCOPD, hypertension, anxiety/depression, chronic pain syndrome, presents to the emergency department for chief concerns of worsening shortness of breath. Patient states that the shortness of breath has been ongoing for the last 3 to 4 days. She denies new medications, sick contacts. She endorses productive cough.   COPDexacerbation, acute on chronic Emphysema -Resumed home Dulera 2 puff twice daily, albuterol inhaler as needed -Continue Solu-Medrol at 60 mg IV every 12 hours, scheduled duo nebs every 6 hours, albuterol nebulizer as needed for wheezing--change to po steroid taper -Incentive spirometry, flutter valve -procalcitonin 0.28--0.16 --empiric zithromax --CXR acute bronchitis changes and pt has productive cough --pt to cont nebs at home --assess for home oxygen --pt qualifies for 2 Liter/mins [place]   hyperglycemia due to steroid-insulin sliding scale with at bedtime coverage  ordered  Hyperlipidemia-atorvastatin   Hypertension-carvedilol   History of heart failure chronic systolic  -the patient appears to be euvolemic at this time, resumed carvedilol, spironolactone, as prescribed -Patient follows with Dr. Lady Gary and ACE/ARB has been deferred due to anaphylaxis secondary to lisinopril  History of tobacco abuse-quit 2 years ago  Family communication : son in the room on 3/27 Consults : CODE STATUS: full DVT Prophylaxis :lovenox Level of care: Progressive Cardiac Status CW:[id number]   Dispo: The patient is from: Home  Anticipated d/c is to: Home  Patient currently is medically improving and will d/c after oxygen has been arranged              Difficult to place patient No   CONSULTS OBTAINED:    DRUG ALLERGIES:   Allergies  Allergen Reactions  . Lisinopril Anaphylaxis    DISCHARGE MEDICATIONS:   Allergies as of 01/06/2021      Reactions   Lisinopril Anaphylaxis      Medication List    STOP taking these medications   oxyCODONE 5 MG immediate release tablet Commonly known as: Oxy IR/ROXICODONE     TAKE these medications   albuterol 108 (90 Base) MCG/ACT inhaler Commonly known as: VENTOLIN HFA Inhale 2 puffs into the lungs every 6 (six) hours as needed for wheezing or shortness of breath.   atorvastatin 40 MG tablet Commonly known as: LIPITOR Take 1 tablet (40 mg total) by mouth at bedtime.   azithromycin 250 MG tablet Commonly known as: ZITHROMAX Take daily as directed Start taking on: January 07, 2021   carvedilol 3.125 MG tablet Commonly known as: COREG  Take 1 tablet (3.125 mg total) by mouth 2 (two) times daily with a meal.   cetirizine 10 MG tablet Commonly known as: ZYRTEC Take 10 mg by mouth daily.   furosemide 20 MG tablet Commonly known as: LASIX Take 1 tablet (20 mg total) by mouth daily. What changed: when to take this   HYDROcodone-acetaminophen 5-325 MG tablet Commonly known  as: NORCO/VICODIN Take 1 tablet by mouth every 6 (six) hours as needed for severe pain. Must last 30 days. What changed: Another medication with the same name was removed. Continue taking this medication, and follow the directions you see here.   ipratropium-albuterol 0.5-2.5 (3) MG/3ML Soln Commonly known as: DUONEB Take 3 mLs by nebulization every 6 (six) hours as needed (wheezing).   mometasone-formoterol 100-5 MCG/ACT Aero Commonly known as: DULERA Inhale 2 puffs into the lungs 2 (two) times daily. What changed:  how much to take when to take this   omeprazole 20 MG capsule Commonly known as: PRILOSEC Take 20 mg by mouth daily.   polyethylene glycol 17 g packet Commonly known as: MIRALAX / GLYCOLAX Take 17 g by mouth daily. What changed:  when to take this reasons to take this   potassium chloride SA 20 MEQ tablet Commonly known as: KLOR-CON Take 20 mEq by mouth daily.   predniSONE 10 MG tablet Commonly known as: DELTASONE Take 60 mg daily---taper by 10 mg daily then stop   ramelteon 8 MG tablet Commonly known as: ROZEREM Take 8 mg by mouth at bedtime.   spironolactone 25 MG tablet Commonly known as: ALDACTONE Take by mouth.       If you experience worsening of your admission symptoms, develop shortness of breath, life threatening emergency, suicidal or homicidal thoughts you must seek medical attention immediately by calling 911 or calling your MD immediately  if symptoms less severe.  You Must read complete instructions/literature along with all the possible adverse reactions/side effects for all the Medicines you take and that have been prescribed to you. Take any new Medicines after you have completely understood and accept all the possible adverse reactions/side effects.   Please note  You were cared for by a hospitalist during your hospital stay. If you have any questions about your discharge medications or the care you received while you were in the  hospital after you are discharged, you can call the unit and asked to speak with the hospitalist on call if the hospitalist that took care of you is not available. Once you are discharged, your primary care physician will handle any further medical issues. Please note that NO REFILLS for any discharge medications will be authorized once you are discharged, as it is imperative that you return to your primary care physician (or establish a relationship with a primary care physician if you do not have one) for your aftercare needs so that they can reassess your need for medications and monitor your lab values. Today   SUBJECTIVE   Slept better Breathes with mouth Some wheezing sats >92% on oxygen 2 liters [place]  VITAL SIGNS:  Blood pressure 111/71, pulse 60, temperature 97.6 F (36.4 C), temperature source Oral, resp. rate 18, height 4\' 9"  (1.448 m), weight 58.2 kg, SpO2 97 %.  I/O:    Intake/Output Summary (Last 24 hours) at 01/06/2021 0834 Last data filed at 01/05/2021 1820 Gross per 24 hour  Intake 1080 ml  Output 1200 ml  Net -120 ml    PHYSICAL EXAMINATION:  GENERAL:  60 y.o.-year-old patient  lying in the bed with no acute distress.  LUNGS: Normal breath sounds bilaterally, scattered wheezing, no rales,rhonchi or crepitation. No use of accessory muscles of respiration.  CARDIOVASCULAR: S1, S2 normal. No murmurs, rubs, or gallops.  ABDOMEN: Soft, non-tender, non-distended. Bowel sounds present. No organomegaly or mass.  EXTREMITIES: No pedal edema, cyanosis, or clubbing.  NEUROLOGIC: grossly non-focal PSYCHIATRIC: The patient is alert and oriented x 3.  SKIN: No obvious rash, lesion, or ulcer.   DATA REVIEW:   CBC  Recent Labs  Lab 01/04/21 0629  WBC 5.3  HGB 10.2*  HCT 31.9*  PLT 198    Chemistries  Recent Labs  Lab 01/03/21 1622 01/03/21 1832 01/04/21 0629  NA 136  --  139  K 4.1  --  3.3*  CL 104  --  106  CO2 23  --  23  GLUCOSE 124*  --  134*  BUN 22*  --   23*  CREATININE 0.68  --  0.75  CALCIUM 8.6*  --  8.7*  MG  --  2.1  --   AST 25  --   --   ALT 20  --   --   ALKPHOS 82  --   --   BILITOT 0.8  --   --     Microbiology Results   Recent Results (from the past 240 hour(s))  Resp Panel by RT-PCR (Flu A&B, Covid) Nasopharyngeal Swab     Status: None   Collection Time: 01/03/21  4:21 PM   Specimen: Nasopharyngeal Swab; Nasopharyngeal(NP) swabs in vial transport medium  Result Value Ref Range Status   SARS Coronavirus 2 by RT PCR NEGATIVE NEGATIVE Final    Comment: (NOTE) SARS-CoV-2 target nucleic acids are NOT DETECTED.  The SARS-CoV-2 RNA is generally detectable in upper respiratory specimens during the acute phase of infection. The lowest concentration of SARS-CoV-2 viral copies this assay can detect is 138 copies/mL. A negative result does not preclude SARS-Cov-2 infection and should not be used as the sole basis for treatment or other patient management decisions. A negative result may occur with  improper specimen collection/handling, submission of specimen other than nasopharyngeal swab, presence of viral mutation(s) within the areas targeted by this assay, and inadequate number of viral copies(<138 copies/mL). A negative result must be combined with clinical observations, patient history, and epidemiological information. The expected result is Negative.  Fact Sheet for Patients:  BloggerCourse.com  Fact Sheet for Healthcare Providers:  SeriousBroker.it  This test is no t yet approved or cleared by the [place] FDA and  has been authorized for detection and/or diagnosis of SARS-CoV-2 by FDA under an Emergency Use Authorization (EUA). This EUA will remain  in effect (meaning this test can be used) for the duration of the COVID-19 declaration under Section 564(b)(1) of the Act, 21 U.S.C.section 360bbb-3(b)(1), unless the authorization is terminated  or revoked  sooner.       Influenza A by PCR NEGATIVE NEGATIVE Final   Influenza B by PCR NEGATIVE NEGATIVE Final    Comment: (NOTE) The Xpert Xpress SARS-CoV-2/FLU/RSV plus assay is intended as an aid in the diagnosis of influenza from Nasopharyngeal swab specimens and should not be used as a sole basis for treatment. Nasal washings and aspirates are unacceptable for Xpert Xpress SARS-CoV-2/FLU/RSV testing.  Fact Sheet for Patients: BloggerCourse.com  Fact Sheet for Healthcare Providers: SeriousBroker.it  This test is not yet approved or cleared by the [place] FDA and has been authorized for detection and/or diagnosis  of SARS-CoV-2 by FDA under an Emergency Use Authorization (EUA). This EUA will remain in effect (meaning this test can be used) for the duration of the COVID-19 declaration under Section 564(b)(1) of the Act, 21 U.S.C. section 360bbb-3(b)(1), unless the authorization is terminated or revoked.  Performed at [facility], [address] [place], [place] [address]     RADIOLOGY:  No results found.   CODE STATUS:     Code Status Orders  (From admission, onward)         Start     Ordered   01/03/21 1824  Full code  Continuous        01/03/21 1824        Code Status History    Date Active Date Inactive Code Status Order ID Comments User Context   08/16/2018 1257 08/18/2018 1633 Full Code [id number]  Milagros Loll, MD Inpatient   08/16/2018 1202 08/16/2018 1257 Partial Code [id number]  Milagros Loll, MD Inpatient   08/14/2018 1957 08/16/2018 1202 Full Code [id number]  Milagros Loll, MD ED   04/11/2018 1726 04/18/2018 2042 Full Code [id number]  Ihor Austin, MD Inpatient   11/23/2017 2050 11/26/2017 2224 Full Code [id number]  Auburn Bilberry, MD ED   Advance Care Planning Activity       TOTAL TIME TAKING CARE OF THIS PATIENT: *40* minutes.    Enedina Finner M.D  [facility]    CC: Primary care  physician; Lorn Junes, FNP

## 2021-01-06 NOTE — Progress Notes (Signed)
Pt will be discharging home, son at bedside. Waiting on her oxygen tank to be delivered. Discharge documentation completed.

## 2021-01-08 LAB — T4: T4, Total: 5.4 ug/dL (ref 4.5–12.0)

## 2021-01-22 ENCOUNTER — Telehealth: Payer: Self-pay | Admitting: Pain Medicine

## 2021-01-22 NOTE — Telephone Encounter (Signed)
Spoke with patients pharmacy and they states that they have a prescription for Hydrocodone that can be picked up today.  Patient notified.

## 2021-02-18 NOTE — Progress Notes (Signed)
PROVIDER NOTE: Information contained herein reflects review and annotations entered in association with encounter. Interpretation of such information and data should be left to medically-trained personnel. Information provided to patient can be located elsewhere in the medical record under "Patient Instructions". Document created using STT-dictation technology, any transcriptional errors that may result from process are unintentional.    Patient: Elizabeth [name]  Service Category: E/M  Provider: Gaspar Cola, MD  DOB: [birth year]  DOS: 02/19/2021  Specialty: Interventional Pain Management  MRN: [id number]  Setting: Ambulatory outpatient  PCP: Bunnie Pion, FNP  Type: Established Patient    Referring Provider: [name], NP  Location: Office  Delivery: Face-to-face     HPI  [name], a 60 y.o. year old female, is here today because of her Chronic pain syndrome [G89.4]. [name]'s primary complain today is Other (RSD) Last encounter: My last encounter with her was on 01/22/2021. Pertinent problems: Ms. Turi has Chronic low back pain (Bilateral) w/o sciatica; Chronic pain syndrome; Presence of spinal cord stimulator; Chronic lower extremity pain (1ry area of Pain) (Bilateral); Lumbar facet syndrome (Bilateral); Lumbar spondylosis; Complex regional pain syndrome type 1 of upper extremities (Bilateral); and Complex regional pain syndrome type 1 of lower extremities (Bilateral) on their pertinent problem list. Pain Assessment: Severity of Chronic pain is reported as a 3 /10. Location: Other (Comment) (RSD) Other (Comment) (all over pain.)/denies. Onset: More than a month ago. Quality: Discomfort,Constant,Nagging. Timing:  . Modifying factor(s): medications. Vitals:  height is $RemoveB'4\' 9"'wPrWMZOu$  (1.448 m) and weight is 129 lb (58.5 kg). Her temporal temperature is 97.1 F (36.2 C) (abnormal). Her blood pressure is 124/72 and her pulse is 82. Her respiration is 16 and oxygen  saturation is 97%.   Reason for encounter: medication management.   The patient indicates doing well with the current medication regimen. No adverse reactions or side effects reported to the medications.   RTCB: 05/22/2021  Pharmacotherapy Assessment   Analgesic: Oxycodone IR 5 mg, 1 tab PO q 6 hrs (20 mg/day of oxycodone) MME/day:30 mg/day.   Monitoring: [place] PMP: PDMP reviewed during this encounter.       Pharmacotherapy: No side-effects or adverse reactions reported. Compliance: No problems identified. Effectiveness: Clinically acceptable.  Janett Billow, RN  02/19/2021  2:58 PM  Sign when Signing Visit Nursing Pain Medication Assessment:  Safety precautions to be maintained throughout the outpatient stay will include: orient to surroundings, keep bed in low position, maintain call bell within reach at all times, provide assistance with transfer out of bed and ambulation.  Medication Inspection Compliance: Pill count conducted under aseptic conditions, in front of the patient. Neither the pills nor the bottle was removed from the patient's sight at any time. Once count was completed pills were immediately returned to the patient in their original bottle.  Medication: Hydrocodone/APAP Pill/Patch Count: 5 of 120 pills remain Pill/Patch Appearance: Markings consistent with prescribed medication Bottle Appearance: Standard pharmacy container. Clearly labeled. Filled Date: 04 / 13 / 2022 Last Medication intake:  Today    UDS:  Summary  Date Value Ref Range Status  03/14/2020 Note  Final    Comment:    ==================================================================== [employer] Select 13 (MW) ==================================================================== Test                             Result       Flag       Units Drug Present and Declared for  Prescription Verification   Oxycodone                      3915         EXPECTED   ng/mg creat   Oxymorphone                     1682         EXPECTED   ng/mg creat   Noroxycodone                   5527         EXPECTED   ng/mg creat   Noroxymorphone                 517          EXPECTED   ng/mg creat    Sources of oxycodone are scheduled prescription medications.    Oxymorphone, noroxycodone, and noroxymorphone are expected    metabolites of oxycodone. Oxymorphone is also available as a    scheduled prescription medication. ==================================================================== Test                      Result    Flag   Units      Ref Range   Creatinine              122              mg/dL      >=20 ==================================================================== Declared Medications:  The flagging and interpretation on this report are based on the  following declared medications.  Unexpected results may arise from  inaccuracies in the declared medications.  **Note: The testing scope of this panel includes these medications:  Oxycodone (Roxicodone)  **Note: The testing scope of this panel does not include the  following reported medications:  Albuterol (Ventolin HFA)  Albuterol (Duoneb)  Atorvastatin (Lipitor)  Carvedilol (Coreg)  Formoterol (Dulera)  Furosemide (Lasix)  Ipratropium (Duoneb)  Mometasone (Dulera)  Omeprazole (Prilosec)  Polyethylene Glycol (MiraLAX)  Potassium (Klor-Con)  Ramelteon (Rozerem)  Spironolactone (Aldactone) ==================================================================== For clinical consultation, please call [phone]. ====================================================================      ROS  Constitutional: Denies any fever or chills Gastrointestinal: No reported hemesis, hematochezia, vomiting, or acute GI distress Musculoskeletal: Denies any acute onset joint swelling, redness, loss of ROM, or weakness Neurological: No reported episodes of acute onset apraxia, aphasia, dysarthria, agnosia, amnesia, paralysis, loss of coordination, or  loss of consciousness  Medication Review  HYDROcodone-acetaminophen, albuterol, atorvastatin, azithromycin, carvedilol, cetirizine, furosemide, ipratropium-albuterol, mometasone-formoterol, omeprazole, polyethylene glycol, potassium chloride SA, predniSONE, ramelteon, and spironolactone  History Review  Allergy: [name] is allergic to lisinopril. Drug: [name]  reports no history of drug use. Alcohol:  reports no history of alcohol use. Tobacco:  reports that she quit smoking about 2 years ago. Her smoking use included cigarettes. She has a 60.00 pack-year smoking history. She has never used smokeless tobacco. Social: [name]  reports that she quit smoking about 2 years ago. Her smoking use included cigarettes. She has a 60.00 pack-year smoking history. She has never used smokeless tobacco. She reports that she does not drink alcohol and does not use drugs. Medical:  has a past medical history of Anxiety, Arm pain (07/25/2015), CHF (congestive heart failure) ([place]), Congestive heart failure ([place]) (07/25/2015), COPD (chronic obstructive pulmonary disease) ([place]), Depression, Hypertension, Lower extremity pain (07/25/2015), Reflux, RSD (reflex sympathetic dystrophy), Spinal cord stimulator status, Vitamin B12 deficiency, and Vitamin D  deficiency. Surgical: [name]  has a past surgical history that includes Spinal cord stimulator implant. Family: family history includes Cancer in her father; Cirrhosis in her mother.  Laboratory Chemistry Profile   Renal Lab Results  Component Value Date   BUN 23 (H) 01/04/2021   CREATININE 0.75 01/04/2021   BCR 17 08/30/2017   GFRAA >60 08/18/2018   GFRNONAA >60 01/04/2021     Hepatic Lab Results  Component Value Date   AST 25 01/03/2021   ALT 20 01/03/2021   ALBUMIN 4.1 01/03/2021   ALKPHOS 82 01/03/2021     Electrolytes Lab Results  Component Value Date   NA 139 01/04/2021   K 3.3 (L) 01/04/2021   CL 106 01/04/2021   CALCIUM 8.7  (L) 01/04/2021   MG 2.1 01/03/2021   PHOS 4.1 04/16/2018     Bone Lab Results  Component Value Date   VD25OH 25.3 (L) 03/18/2016   VD125OH2TOT 76.2 03/18/2016   25OHVITD1 37 08/30/2017   25OHVITD2 1.0 08/30/2017   25OHVITD3 36 08/30/2017     Inflammation (CRP: Acute Phase) (ESR: Chronic Phase) Lab Results  Component Value Date   CRP 4.4 08/30/2017   ESRSEDRATE 30 08/30/2017       Note: Above Lab results reviewed.  Recent Imaging Review  DG Chest Portable 1 View CLINICAL DATA:  Shortness of breath.  EXAM: PORTABLE CHEST 1 VIEW  COMPARISON:  Most recent radiograph 08/14/2018.  CT 12/18/2020  FINDINGS: Normal heart size and mediastinal contours for technique. Peribronchial thickening and bronchitic change. Slight blunting of right costophrenic angle. No confluent consolidation. No pneumothorax. Stimulator in place with tips at the level of the thoracic inlet. No acute osseous abnormalities are seen.  IMPRESSION: 1. Peribronchial thickening and bronchitic change, slightly increased from prior exam. Suspect acute bronchitis. 2. Minimal blunting of right costophrenic angle which may be due to scarring or small effusion.  Electronically Signed   By: Keith Rake M.D.   On: 01/03/2021 16:55 Note: Reviewed        Physical Exam  General appearance: Well nourished, well developed, and well hydrated. In no apparent acute distress Mental status: Alert, oriented x 3 (person, place, & time)       Respiratory: No evidence of acute respiratory distress Eyes: PERLA Vitals: BP 124/72 (BP Location: Right Arm, Patient Position: Sitting, Cuff Size: Normal)   Pulse 82   Temp (!) 97.1 F (36.2 C) (Temporal)   Resp 16   Ht $R'4\' 9"'oJ$  (1.448 m)   Wt 129 lb (58.5 kg)   SpO2 97%   BMI 27.92 kg/m  BMI: Estimated body mass index is 27.92 kg/m as calculated from the following:   Height as of this encounter: $RemoveBeforeD'4\' 9"'wZanPAHMgeuymx$  (1.448 m).   Weight as of this encounter: 129 lb (58.5 kg). Ideal:  Patient must be at least 60 in tall to calculate ideal body weight  Assessment   Status Diagnosis  Controlled Controlled Controlled 1. Chronic pain syndrome   2. Chronic lower extremity pain (1ry area of Pain) (Bilateral)   3. Complex regional pain syndrome type 1 of upper extremities (Bilateral)   4. Complex regional pain syndrome type 1 of lower extremities (Bilateral)   5. Presence of spinal cord stimulator   6. Pharmacologic therapy   7. Chronic use of opiate for therapeutic purpose   8. Uncomplicated opioid dependence ([place])      Updated Problems: Problem  Complex regional pain syndrome type 1 of lower extremities (Bilateral)  Complex regional pain  syndrome type 1 of upper extremities (Bilateral)  Chronic lower extremity pain (1ry area of Pain) (Bilateral)  Chronic low back pain (Bilateral) w/o sciatica  Chronic Use of Opiate for Therapeutic Purpose  History of Tobacco Abuse  Copd (Chronic Obstructive Pulmonary Disease) ([facility])  Gastroenteritis  Htn (Hypertension)  Acute Respiratory Distress (Resolved)  Respiratory Failure ([facility]) (Resolved)  Copd With Acute Bronchitis ([facility]) (Resolved)    Plan of Care  Problem-specific:  No problem-specific Assessment & Plan notes found for this encounter.  Ms. Jameila Keeny Heid has a current medication list which includes the following long-term medication(s): albuterol, atorvastatin, carvedilol, cetirizine, furosemide, ipratropium-albuterol, mometasone-formoterol, omeprazole, potassium chloride sa, ramelteon, spironolactone, [START ON 02/21/2021] hydrocodone-acetaminophen, [START ON 03/23/2021] hydrocodone-acetaminophen, and [START ON 04/22/2021] hydrocodone-acetaminophen.  Pharmacotherapy (Medications Ordered): Meds ordered this encounter  Medications  . HYDROcodone-acetaminophen (NORCO/VICODIN) 5-325 MG tablet    Sig: Take 1 tablet by mouth every 6 (six) hours as needed for severe pain. Must last 30 days.    Dispense:  120 tablet     Refill:  0    Not a duplicate. Do NOT delete! Dispense 1 day early if closed on refill date. Avoid benzodiazepines within 8 hours of opioids. Do not send refill requests.  Marland Kitchen HYDROcodone-acetaminophen (NORCO/VICODIN) 5-325 MG tablet    Sig: Take 1 tablet by mouth every 6 (six) hours as needed for severe pain. Must last 30 days.    Dispense:  120 tablet    Refill:  0    Not a duplicate. Do NOT delete! Dispense 1 day early if closed on refill date. Avoid benzodiazepines within 8 hours of opioids. Do not send refill requests.  Marland Kitchen HYDROcodone-acetaminophen (NORCO/VICODIN) 5-325 MG tablet    Sig: Take 1 tablet by mouth every 6 (six) hours as needed for severe pain. Must last 30 days.    Dispense:  120 tablet    Refill:  0    Not a duplicate. Do NOT delete! Dispense 1 day early if closed on refill date. Avoid benzodiazepines within 8 hours of opioids. Do not send refill requests.   Orders:  Orders Placed This Encounter  Procedures  . Tox[employer] Select 13 (MW), Urine    Volume: 30 ml(s). Minimum 3 ml of urine is needed. Document temperature of fresh sample. Indications: Long term (current) use of opiate analgesic (R42.706)    Order Specific Question:   Release to patient    Answer:   Immediate  . Nursing Instructions:    1). STAT: UDS required today. 2). Make sure to document all opioids and benzodiazepines taken, including time of last intake. 3). If order is entered on a procedure day, make sure sample is obtained before any medications are administered.   Follow-up plan:   Return in about 3 months (around 05/22/2021) for evaluation day, (F2F), (MM).      Interventional therapies:  Considering:   Diagnostic bilateral lumbar facet block  Possible bilateral lumbar facet RFA  Possible bilateral lumbar sympathetic RFA    Palliative PRN treatment(s):   Palliative right vs left SGBs  Palliative  right vs left LSBs      Recent Visits No visits were found meeting these conditions. Showing  recent visits within past 90 days and meeting all other requirements Today's Visits Date Type Provider Dept  02/19/21 Office Visit Milinda Pointer, MD [facility]  Showing today's visits and meeting all other requirements Future Appointments No visits were found meeting these conditions. Showing future appointments within next 90 days and meeting all  other requirements  I discussed the assessment and treatment plan with the patient. The patient was provided an opportunity to ask questions and all were answered. The patient agreed with the plan and demonstrated an understanding of the instructions.  Patient advised to call back or seek an in-person evaluation if the symptoms or condition worsens.  Duration of encounter: 30 minutes.  Note by: Gaspar Cola, MD Date: 02/19/2021; Time: 3:22 PM

## 2021-02-19 ENCOUNTER — Other Ambulatory Visit: Payer: Self-pay

## 2021-02-19 ENCOUNTER — Encounter: Payer: Self-pay | Admitting: Pain Medicine

## 2021-02-19 ENCOUNTER — Ambulatory Visit: Payer: Medicaid Other | Attending: Pain Medicine | Admitting: Pain Medicine

## 2021-02-19 ENCOUNTER — Other Ambulatory Visit: Payer: Self-pay | Admitting: Pain Medicine

## 2021-02-19 VITALS — BP 124/72 | HR 82 | Temp 97.1°F | Resp 16 | Ht <= 58 in | Wt 129.0 lb

## 2021-02-19 DIAGNOSIS — F1120 Opioid dependence, uncomplicated: Secondary | ICD-10-CM | POA: Diagnosis present

## 2021-02-19 DIAGNOSIS — G90523 Complex regional pain syndrome I of lower limb, bilateral: Secondary | ICD-10-CM

## 2021-02-19 DIAGNOSIS — M79604 Pain in right leg: Secondary | ICD-10-CM | POA: Diagnosis present

## 2021-02-19 DIAGNOSIS — Z79899 Other long term (current) drug therapy: Secondary | ICD-10-CM

## 2021-02-19 DIAGNOSIS — G8929 Other chronic pain: Secondary | ICD-10-CM | POA: Diagnosis present

## 2021-02-19 DIAGNOSIS — Z969 Presence of functional implant, unspecified: Secondary | ICD-10-CM | POA: Diagnosis present

## 2021-02-19 DIAGNOSIS — G90513 Complex regional pain syndrome I of upper limb, bilateral: Secondary | ICD-10-CM

## 2021-02-19 DIAGNOSIS — M79605 Pain in left leg: Secondary | ICD-10-CM

## 2021-02-19 DIAGNOSIS — Z79891 Long term (current) use of opiate analgesic: Secondary | ICD-10-CM

## 2021-02-19 DIAGNOSIS — G894 Chronic pain syndrome: Secondary | ICD-10-CM

## 2021-02-19 MED ORDER — HYDROCODONE-ACETAMINOPHEN 5-325 MG PO TABS: 1.0000 | ORAL_TABLET | Freq: Four times a day (QID) | ORAL | 0 refills | Status: DC | PRN

## 2021-02-19 NOTE — Patient Instructions (Signed)
____________________________________________________________________________________________  Drug Holidays (Slow)  What is a "Drug Holiday"? Drug Holiday: is the name given to the period of time during which a patient stops taking a medication(s) for the purpose of eliminating tolerance to the drug.  Benefits . Improved effectiveness of opioids. . Decreased opioid dose needed to achieve benefits. . Improved pain with lesser dose.  What is tolerance? Tolerance: is the progressive decreased in effectiveness of a drug due to its repetitive use. With repetitive use, the body gets use to the medication and as a consequence, it loses its effectiveness. This is a common problem seen with opioid pain medications. As a result, a larger dose of the drug is needed to achieve the same effect that used to be obtained with a smaller dose.  How long should a "Drug Holiday" last? You should stay off of the pain medicine for at least 14 consecutive days. (2 weeks)  Should I stop the medicine "cold turkey"? No. You should always coordinate with your Pain Specialist so that he/she can provide you with the correct medication dose to make the transition as smoothly as possible.  How do I stop the medicine? Slowly. You will be instructed to decrease the daily amount of pills that you take by one (1) pill every seven (7) days. This is called a "slow downward taper" of your dose. For example: if you normally take four (4) pills per day, you will be asked to drop this dose to three (3) pills per day for seven (7) days, then to two (2) pills per day for seven (7) days, then to one (1) per day for seven (7) days, and at the end of those last seven (7) days, this is when the "Drug Holiday" would start.   Will I have withdrawals? By doing a "slow downward taper" like this one, it is unlikely that you will experience any significant withdrawal symptoms. Typically, what triggers withdrawals is the sudden stop of a high  dose opioid therapy. Withdrawals can usually be avoided by slowly decreasing the dose over a prolonged period of time. If you do not follow these instructions and decide to stop your medication abruptly, withdrawals may be possible.  What are withdrawals? Withdrawals: refers to the wide range of symptoms that occur after stopping or dramatically reducing opiate drugs after heavy and prolonged use. Withdrawal symptoms do not occur to patients that use low dose opioids, or those who take the medication sporadically. Contrary to benzodiazepine (example: Valium, Xanax, etc.) or alcohol withdrawals ("Delirium Tremens"), opioid withdrawals are not lethal. Withdrawals are the physical manifestation of the body getting rid of the excess receptors.  Expected Symptoms Early symptoms of withdrawal may include: . Agitation . Anxiety . Muscle aches . Increased tearing . Insomnia . Runny nose . Sweating . Yawning  Late symptoms of withdrawal may include: . Abdominal cramping . Diarrhea . Dilated pupils . Goose bumps . Nausea . Vomiting  Will I experience withdrawals? Due to the slow nature of the taper, it is very unlikely that you will experience any.  What is a slow taper? Taper: refers to the gradual decrease in dose.  (Last update: 05/01/2020) ____________________________________________________________________________________________    ____________________________________________________________________________________________  Medication Recommendations and Reminders  Applies to: All patients receiving prescriptions (written and/or electronic).  Medication Rules & Regulations: These rules and regulations exist for your safety and that of others. They are not flexible and neither are we. Dismissing or ignoring them will be considered "non-compliance" with medication therapy, resulting in complete   and irreversible termination of such therapy. (See document titled "Medication Rules" for  more details.) In all conscience, because of safety reasons, we cannot continue providing a therapy where the patient does not follow instructions.  Pharmacy of record:   Definition: This is the pharmacy where your electronic prescriptions will be sent.   We do not endorse any particular pharmacy, however, we have experienced problems with [facility] not securing enough medication supply for the community.  We do not restrict you in your choice of pharmacy. However, once we write for your prescriptions, we will NOT be re-sending more prescriptions to fix restricted supply problems created by your pharmacy, or your insurance.   The pharmacy listed in the electronic medical record should be the one where you want electronic prescriptions to be sent.  If you choose to change pharmacy, simply notify our nursing staff.  Recommendations:  Keep all of your pain medications in a safe place, under lock and key, even if you live alone. We will NOT replace lost, stolen, or damaged medication.  After you fill your prescription, take 1 week's worth of pills and put them away in a safe place. You should keep a separate, properly labeled bottle for this purpose. The remainder should be kept in the original bottle. Use this as your primary supply, until it runs out. Once it's gone, then you know that you have 1 week's worth of medicine, and it is time to come in for a prescription refill. If you do this correctly, it is unlikely that you will ever run out of medicine.  To make sure that the above recommendation works, it is very important that you make sure your medication refill appointments are scheduled at least 1 week before you run out of medicine. To do this in an effective manner, make sure that you do not leave the office without scheduling your next medication management appointment. Always ask the nursing staff to show you in your prescription , when your medication will be running out. Then arrange for  the receptionist to get you a return appointment, at least 7 days before you run out of medicine. Do not wait until you have 1 or 2 pills left, to come in. This is very poor planning and does not take into consideration that we may need to cancel appointments due to bad weather, sickness, or emergencies affecting our staff.  DO NOT ACCEPT A "Partial Fill": If for any reason your pharmacy does not have enough pills/tablets to completely fill or refill your prescription, do not allow for a "partial fill". The law allows the pharmacy to complete that prescription within 72 hours, without requiring a new prescription. If they do not fill the rest of your prescription within those 72 hours, you will need a separate prescription to fill the remaining amount, which we will NOT provide. If the reason for the partial fill is your insurance, you will need to talk to the pharmacist about payment alternatives for the remaining tablets, but again, DO NOT ACCEPT A PARTIAL FILL, unless you can trust your pharmacist to obtain the remainder of the pills within 72 hours.  Prescription refills and/or changes in medication(s):   Prescription refills, and/or changes in dose or medication, will be conducted only during scheduled medication management appointments. (Applies to both, written and electronic prescriptions.)  No refills on procedure days. No medication will be changed or started on procedure days. No changes, adjustments, and/or refills will be conducted on a procedure day. Doing so   will interfere with the diagnostic portion of the procedure.  No phone refills. No medications will be "called into the pharmacy".  No Fax refills.  No weekend refills.  No Holliday refills.  No after hours refills.  Remember:  Business hours are:  Monday to Thursday 8:00 AM to 4:00 PM Provider's Schedule: Kirsty Monjaraz, MD - Appointments are:  Medication management: Monday and Wednesday 8:00 AM to 4:00 PM Procedure  day: Tuesday and Thursday 7:30 AM to 4:00 PM Bilal Lateef, MD - Appointments are:  Medication management: Tuesday and Thursday 8:00 AM to 4:00 PM Procedure day: Monday and Wednesday 7:30 AM to 4:00 PM (Last update: 05/01/2020) ____________________________________________________________________________________________   ____________________________________________________________________________________________  CBD (cannabidiol) WARNING  Applicable to: All individuals currently taking or considering taking CBD (cannabidiol) and, more important, all patients taking opioid analgesic controlled substances (pain medication). (Example: oxycodone; oxymorphone; hydrocodone; hydromorphone; morphine; methadone; tramadol; tapentadol; fentanyl; buprenorphine; butorphanol; dextromethorphan; meperidine; codeine; etc.)  Legal status: CBD remains a Schedule I drug prohibited for any use. CBD is illegal with one exception. In the [place], CBD has a limited Food and Drug Administration (FDA) approval for the treatment of two specific types of epilepsy disorders. Only one CBD product has been approved by the FDA for this purpose: "Epidiolex". FDA is aware that some companies are marketing products containing cannabis and cannabis-derived compounds in ways that violate the Federal Food, Drug and Cosmetic Act (FD&C Act) and that may put the health and safety of consumers at risk. The FDA, a Federal agency, has not enforced the CBD status since 2018.   Legality: Some manufacturers ship CBD products nationally, which is illegal. Often such products are sold online and are therefore available throughout the country. CBD is openly sold in head shops and health food stores in some states where such sales have not been explicitly legalized. Selling unapproved products with unsubstantiated therapeutic claims is not only a violation of the law, but also can put patients at risk, as these products have not been proven to  be safe or effective. Federal illegality makes it difficult to conduct research on CBD.  Reference: "FDA Regulation of Cannabis and Cannabis-Derived Products, Including Cannabidiol (CBD)" - https://www.fda.gov/news-events/public-health-focus/fda-regulation-cannabis-and-cannabis-derived-products-including-cannabidiol-cbd  Warning: CBD is not FDA approved and has not undergo the same manufacturing controls as prescription drugs.  This means that the purity and safety of available CBD may be questionable. Most of the time, despite manufacturer's claims, it is contaminated with THC (delta-9-tetrahydrocannabinol - the chemical in marijuana responsible for the "HIGH").  When this is the case, the THC contaminant will trigger a positive urine drug screen (UDS) test for Marijuana (carboxy-THC). Because a positive UDS for any illicit substance is a violation of our medication agreement, your opioid analgesics (pain medicine) may be permanently discontinued.  MORE ABOUT CBD  General Information: CBD  is a derivative of the Marijuana (cannabis sativa) plant discovered in 1940. It is one of the 113 identified substances found in Marijuana. It accounts for up to 40% of the plant's extract. As of 2018, preliminary clinical studies on CBD included research for the treatment of anxiety, movement disorders, and pain. CBD is available and consumed in multiple forms, including inhalation of smoke or vapor, as an aerosol spray, and by mouth. It may be supplied as an oil containing CBD, capsules, dried cannabis, or as a liquid solution. CBD is thought not to be as psychoactive as THC (delta-9-tetrahydrocannabinol - the chemical in marijuana responsible for the "HIGH"). Studies suggest that CBD may interact   with different biological target receptors in the body, including cannabinoid and other neurotransmitter receptors. As of 2018 the mechanism of action for its biological effects has not been determined.  Side-effects   Adverse reactions: Dry mouth, diarrhea, decreased appetite, fatigue, drowsiness, malaise, weakness, sleep disturbances, and others.  Drug interactions: CBC may interact with other medications such as blood-thinners. (Last update: 05/18/2020) ____________________________________________________________________________________________   ____________________________________________________________________________________________  Medication Rules  Purpose: To inform patients, and their family members, of our rules and regulations.  Applies to: All patients receiving prescriptions (written or electronic).  Pharmacy of record: Pharmacy where electronic prescriptions will be sent. If written prescriptions are taken to a different pharmacy, please inform the nursing staff. The pharmacy listed in the electronic medical record should be the one where you would like electronic prescriptions to be sent.  Electronic prescriptions: In compliance with the [place] Strengthen Opioid Misuse Prevention (STOP) Act of 2017 (Session Law 2017-74/H243), effective October 12, 2018, all controlled substances must be electronically prescribed. Calling prescriptions to the pharmacy will cease to exist.  Prescription refills: Only during scheduled appointments. Applies to all prescriptions.  NOTE: The following applies primarily to controlled substances (Opioid* Pain Medications).   Type of encounter (visit): For patients receiving controlled substances, face-to-face visits are required. (Not an option or up to the patient.)  Patient's responsibilities: 1. Pain Pills: Bring all pain pills to every appointment (except for procedure appointments). 2. Pill Bottles: Bring pills in original pharmacy bottle. Always bring the newest bottle. Bring bottle, even if empty. 3. Medication refills: You are responsible for knowing and keeping track of what medications you take and those you need refilled. The day before  your appointment: write a list of all prescriptions that need to be refilled. The day of the appointment: give the list to the admitting nurse. Prescriptions will be written only during appointments. No prescriptions will be written on procedure days. If you forget a medication: it will not be "Called in", "Faxed", or "electronically sent". You will need to get another appointment to get these prescribed. No early refills. Do not call asking to have your prescription filled early. 4. Prescription Accuracy: You are responsible for carefully inspecting your prescriptions before leaving our office. Have the discharge nurse carefully go over each prescription with you, before taking them home. Make sure that your name is accurately spelled, that your address is correct. Check the name and dose of your medication to make sure it is accurate. Check the number of pills, and the written instructions to make sure they are clear and accurate. Make sure that you are given enough medication to last until your next medication refill appointment. 5. Taking Medication: Take medication as prescribed. When it comes to controlled substances, taking less pills or less frequently than prescribed is permitted and encouraged. Never take more pills than instructed. Never take medication more frequently than prescribed.  6. Inform other Doctors: Always inform, all of your healthcare providers, of all the medications you take. 7. Pain Medication from other Providers: You are not allowed to accept any additional pain medication from any other Doctor or Healthcare provider. There are two exceptions to this rule. (see below) In the event that you require additional pain medication, you are responsible for notifying us, as stated below. 8. Cough Medicine: Often these contain an opioid, such as codeine or hydrocodone. Never accept or take cough medicine containing these opioids if you are already taking an opioid* medication. The  combination may cause respiratory failure and   death. 9. Medication Agreement: You are responsible for carefully reading and following our Medication Agreement. This must be signed before receiving any prescriptions from our practice. Safely store a copy of your signed Agreement. Violations to the Agreement will result in no further prescriptions. (Additional copies of our Medication Agreement are available upon request.) 10. Laws, Rules, & Regulations: All patients are expected to follow all Federal and State Laws, Statutes, Rules, & Regulations. Ignorance of the Laws does not constitute a valid excuse.  11. Illegal drugs and Controlled Substances: The use of illegal substances (including, but not limited to marijuana and its derivatives) and/or the illegal use of any controlled substances is strictly prohibited. Violation of this rule may result in the immediate and permanent discontinuation of any and all prescriptions being written by our practice. The use of any illegal substances is prohibited. 12. Adopted CDC guidelines & recommendations: Target dosing levels will be at or below 60 MME/day. Use of benzodiazepines** is not recommended.  Exceptions: There are only two exceptions to the rule of not receiving pain medications from other Healthcare Providers. 1. Exception #1 (Emergencies): In the event of an emergency (i.e.: accident requiring emergency care), you are allowed to receive additional pain medication. However, you are responsible for: As soon as you are able, call our office [phone], at any time of the day or night, and leave a message stating your name, the date and nature of the emergency, and the name and dose of the medication prescribed. In the event that your call is answered by a member of our staff, make sure to document and save the date, time, and the name of the person that took your information.  2. Exception #2 (Planned Surgery): In the event that you are scheduled by  another doctor or dentist to have any type of surgery or procedure, you are allowed (for a period no longer than 30 days), to receive additional pain medication, for the acute post-op pain. However, in this case, you are responsible for picking up a copy of our "Post-op Pain Management for Surgeons" handout, and giving it to your surgeon or dentist. This document is available at our office, and does not require an appointment to obtain it. Simply go to our office during business hours (Monday-Thursday from 8:00 AM to 4:00 PM) (Friday 8:00 AM to 12:00 Noon) or if you have a scheduled appointment with us, prior to your surgery, and ask for it by name. In addition, you are responsible for: calling our office [phone], at any time of the day or night, and leaving a message stating your name, name of your surgeon, type of surgery, and date of procedure or surgery. Failure to comply with your responsibilities may result in termination of therapy involving the controlled substances.  *Opioid medications include: morphine, codeine, oxycodone, oxymorphone, hydrocodone, hydromorphone, meperidine, tramadol, tapentadol, buprenorphine, fentanyl, methadone. **Benzodiazepine medications include: diazepam (Valium), alprazolam (Xanax), clonazepam (Klonopine), lorazepam (Ativan), clorazepate (Tranxene), chlordiazepoxide (Librium), estazolam (Prosom), oxazepam (Serax), temazepam (Restoril), triazolam (Halcion) (Last updated: 09/09/2020) ____________________________________________________________________________________________    

## 2021-02-19 NOTE — Progress Notes (Signed)
Nursing Pain Medication Assessment:  Safety precautions to be maintained throughout the outpatient stay will include: orient to surroundings, keep bed in low position, maintain call bell within reach at all times, provide assistance with transfer out of bed and ambulation.  Medication Inspection Compliance: Pill count conducted under aseptic conditions, in front of the patient. Neither the pills nor the bottle was removed from the patient's sight at any time. Once count was completed pills were immediately returned to the patient in their original bottle.  Medication: Hydrocodone/APAP Pill/Patch Count: 5 of 120 pills remain Pill/Patch Appearance: Markings consistent with prescribed medication Bottle Appearance: Standard pharmacy container. Clearly labeled. Filled Date: 04 / 13 / 2022 Last Medication intake:  Today

## 2021-02-27 LAB — TOXASSURE SELECT 13 (MW), URINE

## 2021-04-17 ENCOUNTER — Emergency Department: Payer: Medicaid Other

## 2021-04-17 ENCOUNTER — Inpatient Hospital Stay
Admission: EM | Admit: 2021-04-17 | Discharge: 2021-04-22 | DRG: 189 | Disposition: A | Discharge status: Home / Self Care | Payer: Medicaid Other | Attending: Hospitalist | Admitting: Hospitalist

## 2021-04-17 DIAGNOSIS — I10 Essential (primary) hypertension: Secondary | ICD-10-CM | POA: Diagnosis present

## 2021-04-17 DIAGNOSIS — J9621 Acute and chronic respiratory failure with hypoxia: Secondary | ICD-10-CM | POA: Diagnosis present

## 2021-04-17 DIAGNOSIS — G894 Chronic pain syndrome: Secondary | ICD-10-CM | POA: Diagnosis present

## 2021-04-17 DIAGNOSIS — F411 Generalized anxiety disorder: Secondary | ICD-10-CM | POA: Diagnosis present

## 2021-04-17 DIAGNOSIS — J441 Chronic obstructive pulmonary disease with (acute) exacerbation: Secondary | ICD-10-CM | POA: Diagnosis present

## 2021-04-17 DIAGNOSIS — I509 Heart failure, unspecified: Secondary | ICD-10-CM

## 2021-04-17 DIAGNOSIS — Z87891 Personal history of nicotine dependence: Secondary | ICD-10-CM

## 2021-04-17 DIAGNOSIS — Z9682 Presence of neurostimulator: Secondary | ICD-10-CM

## 2021-04-17 DIAGNOSIS — Z888 Allergy status to other drugs, medicaments and biological substances status: Secondary | ICD-10-CM

## 2021-04-17 DIAGNOSIS — Z79891 Long term (current) use of opiate analgesic: Secondary | ICD-10-CM

## 2021-04-17 DIAGNOSIS — I5042 Chronic combined systolic (congestive) and diastolic (congestive) heart failure: Secondary | ICD-10-CM | POA: Diagnosis present

## 2021-04-17 DIAGNOSIS — Z20822 Contact with and (suspected) exposure to covid-19: Secondary | ICD-10-CM | POA: Diagnosis present

## 2021-04-17 DIAGNOSIS — G90513 Complex regional pain syndrome I of upper limb, bilateral: Secondary | ICD-10-CM | POA: Diagnosis present

## 2021-04-17 DIAGNOSIS — Z7951 Long term (current) use of inhaled steroids: Secondary | ICD-10-CM

## 2021-04-17 DIAGNOSIS — I110 Hypertensive heart disease with heart failure: Secondary | ICD-10-CM | POA: Diagnosis present

## 2021-04-17 DIAGNOSIS — Z79899 Other long term (current) drug therapy: Secondary | ICD-10-CM

## 2021-04-17 DIAGNOSIS — Z87892 Personal history of anaphylaxis: Secondary | ICD-10-CM

## 2021-04-17 DIAGNOSIS — J9601 Acute respiratory failure with hypoxia: Secondary | ICD-10-CM

## 2021-04-17 DIAGNOSIS — D649 Anemia, unspecified: Secondary | ICD-10-CM | POA: Diagnosis present

## 2021-04-17 DIAGNOSIS — I5032 Chronic diastolic (congestive) heart failure: Secondary | ICD-10-CM

## 2021-04-17 LAB — BLOOD GAS, VENOUS
Acid-base deficit: 1.6 mmol/L (ref 0.0–2.0)
Bicarbonate: 22.9 mmol/L (ref 20.0–28.0)
O2 Saturation: 93.5 %
Patient temperature: 37
pCO2, Ven: 37 mmHg — ABNORMAL LOW (ref 44.0–60.0)
pH, Ven: 7.4 (ref 7.250–7.430)
pO2, Ven: 69 mmHg — ABNORMAL HIGH (ref 32.0–45.0)

## 2021-04-17 LAB — CBC WITH DIFFERENTIAL/PLATELET
Abs Immature Granulocytes: 0.19 10*3/uL — ABNORMAL HIGH (ref 0.00–0.07)
Basophils Absolute: 0 10*3/uL (ref 0.0–0.1)
Basophils Relative: 0 %
Eosinophils Absolute: 0 10*3/uL (ref 0.0–0.5)
Eosinophils Relative: 0 %
HCT: 35.5 % — ABNORMAL LOW (ref 36.0–46.0)
Hemoglobin: 10.8 g/dL — ABNORMAL LOW (ref 12.0–15.0)
Immature Granulocytes: 2 %
Lymphocytes Relative: 15 %
Lymphs Abs: 1.7 10*3/uL (ref 0.7–4.0)
MCH: 26.6 pg (ref 26.0–34.0)
MCHC: 30.4 g/dL (ref 30.0–36.0)
MCV: 87.4 fL (ref 80.0–100.0)
Monocytes Absolute: 0.4 10*3/uL (ref 0.1–1.0)
Monocytes Relative: 4 %
Neutro Abs: 9.2 10*3/uL — ABNORMAL HIGH (ref 1.7–7.7)
Neutrophils Relative %: 79 %
Platelets: 388 10*3/uL (ref 150–400)
RBC: 4.06 MIL/uL (ref 3.87–5.11)
RDW: 14.7 % (ref 11.5–15.5)
WBC: 11.5 10*3/uL — ABNORMAL HIGH (ref 4.0–10.5)
nRBC: 0 % (ref 0.0–0.2)

## 2021-04-17 LAB — BRAIN NATRIURETIC PEPTIDE: B Natriuretic Peptide: 67.2 pg/mL (ref 0.0–100.0)

## 2021-04-17 LAB — TROPONIN I (HIGH SENSITIVITY)
Troponin I (High Sensitivity): 4 ng/L (ref <18)
Troponin I (High Sensitivity): 3 ng/L (ref <18)

## 2021-04-17 LAB — BASIC METABOLIC PANEL
Anion gap: 12 (ref 5–15)
BUN: 28 mg/dL — ABNORMAL HIGH (ref 6–20)
CO2: 22 mmol/L (ref 22–32)
Calcium: 9.4 mg/dL (ref 8.9–10.3)
Chloride: 107 mmol/L (ref 98–111)
Creatinine, Ser: 0.84 mg/dL (ref 0.44–1.00)
GFR, Estimated: >60 mL/min (ref >60)
Glucose, Bld: 137 mg/dL — ABNORMAL HIGH (ref 70–99)
Potassium: 4.5 mmol/L (ref 3.5–5.1)
Sodium: 141 mmol/L (ref 135–145)

## 2021-04-17 MED ORDER — ALBUTEROL SULFATE (2.5 MG/3ML) 0.083% IN NEBU
2.5000 mg | INHALATION_SOLUTION | RESPIRATORY_TRACT | Status: DC | PRN
  Administered 2021-04-18: 2.5 mg via RESPIRATORY_TRACT
  Filled 2021-04-17: qty 3

## 2021-04-17 MED ORDER — ALBUTEROL SULFATE (2.5 MG/3ML) 0.083% IN NEBU
INHALATION_SOLUTION | RESPIRATORY_TRACT | Status: AC
  Administered 2021-04-18: 10 mg via RESPIRATORY_TRACT
  Filled 2021-04-17: qty 3

## 2021-04-17 MED ORDER — PREDNISONE 20 MG PO TABS
40.0000 mg | ORAL_TABLET | Freq: Every day | ORAL | Status: AC
  Administered 2021-04-19 – 2021-04-22 (×4): 40 mg via ORAL
  Filled 2021-04-17 (×4): qty 2

## 2021-04-17 MED ORDER — METHYLPREDNISOLONE SODIUM SUCC 40 MG IJ SOLR
40.0000 mg | Freq: Four times a day (QID) | INTRAMUSCULAR | Status: AC
  Administered 2021-04-17 – 2021-04-18 (×4): 40 mg via INTRAVENOUS
  Filled 2021-04-17 (×4): qty 1

## 2021-04-17 MED ORDER — IPRATROPIUM-ALBUTEROL 0.5-2.5 (3) MG/3ML IN SOLN
6.0000 mL | Freq: Once | RESPIRATORY_TRACT | Status: AC
  Administered 2021-04-17: 6 mL via RESPIRATORY_TRACT
  Filled 2021-04-17: qty 3

## 2021-04-17 MED ORDER — ONDANSETRON HCL 4 MG PO TABS: 4.0000 mg | ORAL_TABLET | Freq: Four times a day (QID) | ORAL | Status: DC | PRN

## 2021-04-17 MED ORDER — IPRATROPIUM-ALBUTEROL 0.5-2.5 (3) MG/3ML IN SOLN
3.0000 mL | Freq: Four times a day (QID) | RESPIRATORY_TRACT | Status: DC
  Administered 2021-04-17 – 2021-04-18 (×4): 3 mL via RESPIRATORY_TRACT
  Filled 2021-04-17 (×3): qty 3

## 2021-04-17 MED ORDER — ACETAMINOPHEN 650 MG RE SUPP: 650.0000 mg | Freq: Four times a day (QID) | RECTAL | Status: DC | PRN

## 2021-04-17 MED ORDER — ENOXAPARIN SODIUM 40 MG/0.4ML IJ SOSY
40.0000 mg | PREFILLED_SYRINGE | INTRAMUSCULAR | Status: DC
  Administered 2021-04-17 – 2021-04-21 (×5): 40 mg via SUBCUTANEOUS
  Filled 2021-04-17 (×4): qty 0.4

## 2021-04-17 MED ORDER — BISACODYL 5 MG PO TBEC: 5.0000 mg | DELAYED_RELEASE_TABLET | Freq: Every day | ORAL | Status: DC | PRN

## 2021-04-17 MED ORDER — ACETAMINOPHEN 325 MG PO TABS: 650.0000 mg | ORAL_TABLET | Freq: Four times a day (QID) | ORAL | Status: DC | PRN

## 2021-04-17 MED ORDER — HYDROCODONE-ACETAMINOPHEN 5-325 MG PO TABS
1.0000 | ORAL_TABLET | Freq: Four times a day (QID) | ORAL | Status: DC | PRN
  Administered 2021-04-17 – 2021-04-18 (×3): 1 via ORAL
  Filled 2021-04-17 (×3): qty 1

## 2021-04-17 MED ORDER — ONDANSETRON HCL 4 MG/2ML IJ SOLN: 4.0000 mg | Freq: Four times a day (QID) | INTRAMUSCULAR | Status: DC | PRN

## 2021-04-17 MED ORDER — LORAZEPAM 2 MG/ML IJ SOLN
1.0000 mg | Freq: Once | INTRAMUSCULAR | Status: AC
  Administered 2021-04-17: 1 mg via INTRAVENOUS
  Filled 2021-04-17: qty 1

## 2021-04-17 NOTE — H&P (Signed)
History and Physical    Elizabeth BellmanRita Lannette [name] WUJ:[id number] DOB: July 08, 1961 DOA: 04/17/2021  PCP: Lorn Junesrawford, Anna E, FNP   Patient coming from: Home  I have personally briefly reviewed [facility]  Chief Complaint: Shortness of breath, respiratory distress.  HPI: Elizabeth [name] is a 60 y.o. female with medical history significant for COPD, last hospitalized 3 months prior, anxiety, CHF with last echo 2019 showing EF 45% and grade 1 diastolic dysfunction, chronic pain syndrome on chronic opiates, HTN and tobacco use disorder who was brought in by EMS in respiratory distress with tachypnea in the 40s to 50s requiring CPAP.  She reported chest tightness with breathing but denied nausea, vomiting or diaphoresis.  Denied cough, fever or chills, abdominal pain or diarrhea. ED course: On arrival BP 172/130 with pulse 112 respirations 40 and afebrile and O2 sat 99% on CPAP.  She was transitioned to BiPAP on arrival.  Blood work significant for venous blood gas with pH 7.40, PCO2 37 and PO2 69, troponin of 4 and BNP 67.2.  Blood work otherwise unremarkable except for slightly elevated WBC of 11.5 mild anemia of 10.8. EKG personally viewed and interpreted: Sinus tach at 101 with incomplete RBBB and nonspecific ST-T wave changes Imaging: Chest x-ray with chronic interstitial changes  Patient was treated with duo nebs and Solu-Medrol as well as IV Ativan for suspected anxiety based on venous blood gas.  Hospitalist consulted for admission.  Review of Systems: As per HPI otherwise all other systems on review of systems negative.    Past Medical History:  Diagnosis Date   Anxiety    Arm pain 07/25/2015   CHF (congestive heart failure) (HCC)    Congestive heart failure (HCC) 07/25/2015   COPD (chronic obstructive pulmonary disease) (HCC)    never been idagnosed   Depression    Hypertension    Lower extremity pain 07/25/2015   Reflux    RSD (reflex  sympathetic dystrophy)    Spinal cord stimulator status    Vitamin B12 deficiency    Vitamin D deficiency     Past Surgical History:  Procedure Laterality Date   SPINAL CORD STIMULATOR IMPLANT     x 2     reports that she quit smoking about 3 years ago. Her smoking use included cigarettes. She has a 60.00 pack-year smoking history. She has never used smokeless tobacco. She reports that she does not drink alcohol and does not use drugs.  Allergies  Allergen Reactions   Lisinopril Anaphylaxis    Family History  Problem Relation Age of Onset   Cirrhosis Mother    Cancer Father       Prior to Admission medications   Medication Sig Start Date End Date Taking? Authorizing Provider  albuterol (PROVENTIL HFA;VENTOLIN HFA) 108 (90 Base) MCG/ACT inhaler Inhale 2 puffs into the lungs every 6 (six) hours as needed for wheezing or shortness of breath. 11/26/17   Enid BaasKalisetti, Radhika, MD  atorvastatin (LIPITOR) 40 MG tablet Take 1 tablet (40 mg total) by mouth at bedtime. 08/18/18   Enid BaasKalisetti, Radhika, MD  azithromycin (ZITHROMAX) 250 MG tablet Take daily as directed Patient not taking: Reported on 02/19/2021 01/07/21   Enedina FinnerPatel, Sona, MD  carvedilol (COREG) 3.125 MG tablet Take 1 tablet (3.125 mg total) by mouth 2 (two) times daily with a meal. 08/18/18   Enid BaasKalisetti, Radhika, MD  cetirizine (ZYRTEC) 10 MG tablet Take 10 mg by mouth daily.    [provider]  furosemide (  LASIX) 20 MG tablet Take 1 tablet (20 mg total) by mouth daily. Patient taking differently: Take 20 mg by mouth 2 (two) times daily. 08/18/18   Enid Baas, MD  HYDROcodone-acetaminophen (NORCO/VICODIN) 5-325 MG tablet Take 1 tablet by mouth every 6 (six) hours as needed for severe pain. Must last 30 days. 02/21/21 03/23/21  Delano Metz, MD  HYDROcodone-acetaminophen (NORCO/VICODIN) 5-325 MG tablet Take 1 tablet by mouth every 6 (six) hours as needed for severe pain. Must last 30 days. 03/23/21 04/22/21  Delano Metz, MD  HYDROcodone-acetaminophen (NORCO/VICODIN) 5-325 MG tablet Take 1 tablet by mouth every 6 (six) hours as needed for severe pain. Must last 30 days. 04/22/21 05/22/21  Delano Metz, MD  ipratropium-albuterol (DUONEB) 0.5-2.5 (3) MG/3ML SOLN Take 3 mLs by nebulization every 6 (six) hours as needed (wheezing). 01/06/21   Enedina Finner, MD  mometasone-formoterol [facility]) 100-5 MCG/ACT AERO Inhale 2 puffs into the lungs 2 (two) times daily. Patient taking differently: Inhale 1 puff into the lungs daily. 08/18/18 05/27/20  Enid Baas, MD  omeprazole (PRILOSEC) 20 MG capsule Take 20 mg by mouth daily.     [provider]  polyethylene glycol (MIRALAX / GLYCOLAX) packet Take 17 g by mouth daily. Patient taking differently: Take 17 g by mouth daily as needed for mild constipation or moderate constipation. 11/27/17   Enid Baas, MD  potassium chloride SA (K-DUR,KLOR-CON) 20 MEQ tablet Take 20 mEq by mouth daily.    [provider]  predniSONE (DELTASONE) 10 MG tablet Take 60 mg daily---taper by 10 mg daily then stop 01/06/21   Enedina Finner, MD  ramelteon (ROZEREM) 8 MG tablet Take 8 mg by mouth at bedtime.    [provider]  spironolactone (ALDACTONE) 25 MG tablet Take by mouth. 06/29/19 02/19/21  [provider]    Physical Exam: Vitals:   04/17/21 1911 04/17/21 1954 04/17/21 2000 04/17/21 2008  BP: 116/71 120/74    Pulse: 91 (!) 101  95  Resp: (!) 36 18    Temp:      TempSrc:      SpO2: 99% 100% 100% 100%  Weight:      Height:         Vitals:   04/17/21 1911 04/17/21 1954 04/17/21 2000 04/17/21 2008  BP: 116/71 120/74    Pulse: 91 (!) 101  95  Resp: (!) 36 18    Temp:      TempSrc:      SpO2: 99% 100% 100% 100%  Weight:      Height:          Constitutional: Alert and oriented x 3 .  In moderate respiratory distress, very tachypneic on BiPAP.  Appears anxious HEENT:      Head: Normocephalic and atraumatic.         Eyes:  PERLA, EOMI, Conjunctivae are normal. Sclera is non-icteric.       Mouth/Throat: Mucous membranes are moist.       Neck: Supple with no signs of meningismus. Cardiovascular: Regular rate and rhythm. No murmurs, gallops, or rubs. 2+ symmetrical distal pulses are present . No JVD. No LE edema Respiratory: Respiratory effort increased with rhonchi throughout Gastrointestinal: Soft, non tender, and non distended with positive bowel sounds.  Genitourinary: No CVA tenderness. Musculoskeletal: Nontender with normal range of motion in all extremities. No cyanosis, or erythema of extremities. Neurologic:  Face is symmetric. Moving all extremities. No gross focal neurologic deficits . Skin: Skin is warm, dry.  No rash  or ulcers Psychiatric: Appears anxious   Labs on Admission: I have personally reviewed following labs and imaging studies  CBC: Recent Labs  Lab 04/17/21 1740  WBC 11.5*  NEUTROABS 9.2*  HGB 10.8*  HCT 35.5*  MCV 87.4  PLT 388   Basic Metabolic Panel: Recent Labs  Lab 04/17/21 1740  NA 141  K 4.5  CL 107  CO2 22  GLUCOSE 137*  BUN 28*  CREATININE 0.84  CALCIUM 9.4   GFR: Estimated Creatinine Clearance: 50.6 mL/min (by C-G formula based on SCr of 0.84 mg/dL). Liver Function Tests: No results for input(s): AST, ALT, ALKPHOS, BILITOT, PROT, ALBUMIN in the last 168 hours. No results for input(s): LIPASE, AMYLASE in the last 168 hours. No results for input(s): AMMONIA in the last 168 hours. Coagulation Profile: No results for input(s): INR, PROTIME in the last 168 hours. Cardiac Enzymes: No results for input(s): CKTOTAL, CKMB, CKMBINDEX, TROPONINI in the last 168 hours. BNP (last 3 results) No results for input(s): PROBNP in the last 8760 hours. HbA1C: No results for input(s): HGBA1C in the last 72 hours. CBG: No results for input(s): GLUCAP in the last 168 hours. Lipid Profile: No results for input(s): CHOL, HDL, LDLCALC, TRIG, CHOLHDL, LDLDIRECT in the last 72  hours. Thyroid Function Tests: No results for input(s): TSH, T4TOTAL, FREET4, T3FREE, THYROIDAB in the last 72 hours. Anemia Panel: No results for input(s): VITAMINB12, FOLATE, FERRITIN, TIBC, IRON, RETICCTPCT in the last 72 hours. Urine analysis:    Component Value Date/Time   COLORURINE STRAW (A) 05/24/2018 1150   APPEARANCEUR CLEAR (A) 05/24/2018 1150   LABSPEC 1.005 05/24/2018 1150   PHURINE 5.0 05/24/2018 1150   GLUCOSEU NEGATIVE 05/24/2018 1150   HGBUR NEGATIVE 05/24/2018 1150   BILIRUBINUR NEGATIVE 05/24/2018 1150   KETONESUR NEGATIVE 05/24/2018 1150   PROTEINUR NEGATIVE 05/24/2018 1150   NITRITE NEGATIVE 05/24/2018 1150   LEUKOCYTESUR TRACE (A) 05/24/2018 1150    Radiological Exams on Admission: DG Chest Portable 1 View  Result Date: 04/17/2021 CLINICAL DATA:  Shortness of breath. EXAM: PORTABLE CHEST 1 VIEW COMPARISON:  01/03/2021 FINDINGS: The lungs are clear without focal pneumonia, edema, pneumothorax or pleural effusion. Interstitial markings are diffusely coarsened with chronic features. Cardiopericardial silhouette is at upper limits of normal for size. The visualized bony structures of the thorax show no acute abnormality. Cervicothoracic spinal stimulator leads again noted. Telemetry leads overlie the chest. IMPRESSION: Chronic interstitial coarsening.  No active disease. Electronically Signed   By: Kennith Center M.D.   On: 04/17/2021 18:32     Assessment/Plan 60 year old female with history of COPD, last hospitalized 3 months prior, anxiety, CHF with last echo 2019 showing EF 45% and grade 1 diastolic dysfunction, chronic pain syndrome on chronic opiates, HTN and tobacco use disorder who was brought in by EMS in respiratory distress with tachypnea in the 40s to 50s requiring CPAP.      COPD with acute exacerbation (HCC) -Patient very tachypneic but with reassuring labs, VBG pH 7.40, PCO2 37 and PO2 69, troponin of 4 and BNP 67  - Schedule and as needed nebulized  bronchodilator -IV steroids - BiPAP and wean as tolerated to O2 - Tobacco cessation counseling    Generalized anxiety disorder - Ativan/Xanax as needed    Chronic pain syndrome - Continue hydrocodone every 6 as needed    Congestive heart failure, chronic, combined - Last echo 2019 with EF 45% and grade 1 diastolic dysfunction - BNP normal at 67 and chest x-ray with chronic  findings only and with normal troponin - CHF exacerbation not suspected    HTN (hypertension) -Continue home antihypertensives    DVT prophylaxis: Lovenox  Code Status: full code  Family Communication:  none  Disposition Plan: Back to previous home environment Consults called: none  Status:At the time of admission, it appears that the appropriate admission status for this patient is INPATIENT. This is judged to be reasonable and necessary in order to provide the required intensity of service to ensure the patient's safety given the presenting symptoms, physical exam findings, and initial radiographic and laboratory data in the context of their  Comorbid conditions.   Patient requires inpatient status due to high intensity of service, high risk for further deterioration and high frequency of surveillance required.   I certify that at the point of admission it is my clinical judgment that the patient will require inpatient hospital care spanning beyond 2 midnights     Andris Baumann MD [facility]     04/17/2021, 8:17 PM

## 2021-04-17 NOTE — Progress Notes (Signed)
Pt transported on Bipap to 2A without incident

## 2021-04-17 NOTE — ED Provider Notes (Signed)
[facility] Emergency Department Provider Note   ____________________________________________   Event Date/Time   First MD Initiated Contact with Patient 04/17/21 1747     (approximate)  I have reviewed the triage vital signs and the nursing notes.   HISTORY  Chief Complaint Shortness of Breath (Rrate 40-50, sob )    HPI Elizabeth [name] is a 60 y.o. female with past medical history of hypertension, CHF, COPD, anxiety, and chronic pain syndrome who presents to the ED complaining of shortness of breath.  History is limited due to patient's respiratory distress.  Per EMS, patient presented to her outpatient clinic today with worsening difficulty breathing over the past week.  They noted patient to be in respiratory distress and EMS was called, who then found patient to be breathing about 40 times per minute with wheezes and rales in all fields.  She was placed on CPAP, given DuoNeb x1 along with 125 mg Solu-Medrol and 2 g of magnesium.  EMS reported minimal improvement in her symptoms following these interventions.  Patient endorses chest pain but is unable to further describe it, also reports swelling in her legs.        Past Medical History:  Diagnosis Date   Anxiety    Arm pain 07/25/2015   CHF (congestive heart failure) (HCC)    Congestive heart failure (HCC) 07/25/2015   COPD (chronic obstructive pulmonary disease) (HCC)    never been idagnosed   Depression    Hypertension    Lower extremity pain 07/25/2015   Reflux    RSD (reflex sympathetic dystrophy)    Spinal cord stimulator status    Vitamin B12 deficiency    Vitamin D deficiency     Patient Active Problem List   Diagnosis Date Noted   Chronic use of opiate for therapeutic purpose 02/19/2021   History of tobacco abuse 01/03/2021   Gastroenteritis 01/03/2021   Complex regional pain syndrome type 1 of lower extremities (Bilateral) 11/20/2020   Complex regional pain syndrome type 1  of upper extremities (Bilateral) 05/16/2019   Pharmacologic therapy 05/16/2019   Disorder of skeletal system 05/16/2019   Problems influencing health status 05/16/2019   Palliative care by specialist 04/07/2019   HTN (hypertension) 08/24/2018   COPD (chronic obstructive pulmonary disease) (HCC) 08/24/2018   Lumbar spondylosis 08/22/2018   Disorder of bone, unspecified 08/30/2017   Opioid-induced constipation (OIC) 09/15/2016   Lumbar facet syndrome (Bilateral) 03/18/2016   Insomnia secondary to chronic pain 12/16/2015   Chronic lower extremity pain (1ry area of Pain) (Bilateral) 09/23/2015   Long term prescription opiate use 09/23/2015   Goals of care, counseling/discussion 09/23/2015   Elevated sedimentation rate 09/23/2015   Chronic low back pain (Bilateral) w/o sciatica 07/25/2015   Generalized anxiety disorder 07/25/2015   Encounter for therapeutic drug level monitoring 07/25/2015   Long term current use of opiate analgesic 07/25/2015   Uncomplicated opioid dependence (HCC) 07/25/2015   Opiate use (30 MME/Day) 07/25/2015   Chronic pain syndrome 07/25/2015   Opiate analgesic use agreement exists 07/25/2015   Presence of spinal cord stimulator 07/25/2015   COPD exacerbation (HCC) 07/25/2015   Congestive heart failure (HCC) 07/25/2015   Vitamin D deficiency 07/25/2015   Vitamin B12 deficiency 07/25/2015    Past Surgical History:  Procedure Laterality Date   SPINAL CORD STIMULATOR IMPLANT     x 2    Prior to Admission medications   Medication Sig Start Date End Date Taking? Authorizing Provider  albuterol (PROVENTIL HFA;VENTOLIN HFA)  108 (90 Base) MCG/ACT inhaler Inhale 2 puffs into the lungs every 6 (six) hours as needed for wheezing or shortness of breath. 11/26/17   Enid Baas, MD  atorvastatin (LIPITOR) 40 MG tablet Take 1 tablet (40 mg total) by mouth at bedtime. 08/18/18   Enid Baas, MD  azithromycin (ZITHROMAX) 250 MG tablet Take daily as  directed Patient not taking: Reported on 02/19/2021 01/07/21   Enedina Finner, MD  carvedilol (COREG) 3.125 MG tablet Take 1 tablet (3.125 mg total) by mouth 2 (two) times daily with a meal. 08/18/18   Enid Baas, MD  cetirizine (ZYRTEC) 10 MG tablet Take 10 mg by mouth daily.    [provider]  furosemide (LASIX) 20 MG tablet Take 1 tablet (20 mg total) by mouth daily. Patient taking differently: Take 20 mg by mouth 2 (two) times daily. 08/18/18   Enid Baas, MD  HYDROcodone-acetaminophen (NORCO/VICODIN) 5-325 MG tablet Take 1 tablet by mouth every 6 (six) hours as needed for severe pain. Must last 30 days. 02/21/21 03/23/21  Delano Metz, MD  HYDROcodone-acetaminophen (NORCO/VICODIN) 5-325 MG tablet Take 1 tablet by mouth every 6 (six) hours as needed for severe pain. Must last 30 days. 03/23/21 04/22/21  Delano Metz, MD  HYDROcodone-acetaminophen (NORCO/VICODIN) 5-325 MG tablet Take 1 tablet by mouth every 6 (six) hours as needed for severe pain. Must last 30 days. 04/22/21 05/22/21  Delano Metz, MD  ipratropium-albuterol (DUONEB) 0.5-2.5 (3) MG/3ML SOLN Take 3 mLs by nebulization every 6 (six) hours as needed (wheezing). 01/06/21   Enedina Finner, MD  mometasone-formoterol [facility]) 100-5 MCG/ACT AERO Inhale 2 puffs into the lungs 2 (two) times daily. Patient taking differently: Inhale 1 puff into the lungs daily. 08/18/18 05/27/20  Enid Baas, MD  omeprazole (PRILOSEC) 20 MG capsule Take 20 mg by mouth daily.     [provider]  polyethylene glycol (MIRALAX / GLYCOLAX) packet Take 17 g by mouth daily. Patient taking differently: Take 17 g by mouth daily as needed for mild constipation or moderate constipation. 11/27/17   Enid Baas, MD  potassium chloride SA (K-DUR,KLOR-CON) 20 MEQ tablet Take 20 mEq by mouth daily.    [provider]  predniSONE (DELTASONE) 10 MG tablet Take 60 mg daily---taper by 10 mg daily then stop 01/06/21   Enedina Finner, MD  ramelteon (ROZEREM) 8 MG tablet Take 8 mg by mouth at bedtime.    [provider]  spironolactone (ALDACTONE) 25 MG tablet Take by mouth. 06/29/19 02/19/21  [provider]    Allergies Lisinopril  Family History  Problem Relation Age of Onset   Cirrhosis Mother    Cancer Father     Social History Social History   Tobacco Use   Smoking status: Former    Packs/day: 1.50    Years: 40.00    Pack years: 60.00    Types: Cigarettes    Quit date: 04/11/2018    Years since quitting: 3.0   Smokeless tobacco: Never  Vaping Use   Vaping Use: Never used  Substance Use Topics   Alcohol use: No    Alcohol/week: 0.0 standard drinks   Drug use: No    Review of Systems  Constitutional: No fever/chills Eyes: No visual changes. ENT: No sore throat. Cardiovascular: Positive for chest pain. Respiratory: Positive for cough and shortness of breath. Gastrointestinal: No abdominal pain.  No nausea, no vomiting.  No diarrhea.  No constipation. Genitourinary: Negative for dysuria. Musculoskeletal: Negative for back pain.  Positive for leg swelling.  Skin: Negative for rash. Neurological: Negative for headaches, focal weakness or numbness.  ____________________________________________   PHYSICAL EXAM:  VITAL SIGNS: ED Triage Vitals  Enc Vitals Group     BP 04/17/21 1747 (!) 170/130     Pulse Rate 04/17/21 1743 (!) 112     Resp 04/17/21 1743 (!) 40     Temp 04/17/21 1743 98.8 F (37.1 C)     Temp Source 04/17/21 1743 Axillary     SpO2 04/17/21 1743 99 %     Weight 04/17/21 1743 99 lb 3.3 oz (45 kg)     Height 04/17/21 1743 5\' 1"  (1.549 m)     Head Circumference --      Peak Flow --      Pain Score 04/17/21 1743 0     Pain Loc --      Pain Edu? --      Excl. in GC? --     Constitutional: Alert and oriented. Eyes: Conjunctivae are normal. Head: Atraumatic. Nose: No congestion/rhinnorhea. Mouth/Throat: Mucous membranes are moist. Neck: Normal  ROM Cardiovascular: Tachycardic, regular rhythm. Grossly normal heart sounds. Respiratory: Tachypneic with increased respiratory effort and accessory muscle use, lungs sounds with poor air movement and faint wheezing noted. Gastrointestinal: Soft and nontender. No distention. Genitourinary: deferred Musculoskeletal: No lower extremity tenderness, 2+ pitting edema to knees bilaterally. Neurologic:  Normal speech and language. No gross focal neurologic deficits are appreciated. Skin:  Skin is warm, dry and intact. No rash noted. Psychiatric: Mood and affect are normal. Speech and behavior are normal.  ____________________________________________   LABS (all labs ordered are listed, but only abnormal results are displayed)  Labs Reviewed  CBC WITH DIFFERENTIAL/PLATELET - Abnormal; Notable for the following components:      Result Value   WBC 11.5 (*)    Hemoglobin 10.8 (*)    HCT 35.5 (*)    Neutro Abs 9.2 (*)    Abs Immature Granulocytes 0.19 (*)    All other components within normal limits  BASIC METABOLIC PANEL - Abnormal; Notable for the following components:   Glucose, Bld 137 (*)    BUN 28 (*)    All other components within normal limits  BLOOD GAS, VENOUS - Abnormal; Notable for the following components:   pCO2, Ven 37 (*)    pO2, Ven 69.0 (*)    All other components within normal limits  RESP PANEL BY RT-PCR (FLU A&B, COVID) ARPGX2  BRAIN NATRIURETIC PEPTIDE  TROPONIN I (HIGH SENSITIVITY)  TROPONIN I (HIGH SENSITIVITY)   ____________________________________________  EKG  ED ECG REPORT I, Chesley Noonharles Yina Riviere, the attending physician, personally viewed and interpreted this ECG.   Date: 04/17/2021  EKG Time: 18:01  Rate: 101  Rhythm: sinus tachycardia  Axis: Normal  Intervals:none  ST&T Change: None   PROCEDURES  Procedure(s) performed (including Critical Care):  .Critical Care  Date/Time: 04/17/2021 8:10 PM Performed by: Chesley NoonJessup, Warnell Rasnic, MD Authorized by:  Chesley NoonJessup, Noely Kuhnle, MD   Critical care provider statement:    Critical care time (minutes):  45   Critical care time was exclusive of:  Separately billable procedures and treating other patients and teaching time   Critical care was necessary to treat or prevent imminent or life-threatening deterioration of the following conditions:  Respiratory failure   Critical care was time spent personally by me on the following activities:  Discussions with consultants, evaluation of patient's response to treatment, examination of patient, ordering and performing treatments and interventions, ordering and review of laboratory studies, ordering  and review of radiographic studies, pulse oximetry, re-evaluation of patient's condition, obtaining history from patient or surrogate and review of old charts   I assumed direction of critical care for this patient from another provider in my specialty: no     Care discussed with: admitting provider     ____________________________________________   INITIAL IMPRESSION / ASSESSMENT AND PLAN / ED COURSE      60 year old female with past medical history of hypertension, CHF, COPD, anxiety, and chronic pain syndrome who presents to the ED with increasing difficulty breathing for the past week with associated chest pain and leg swelling.  Patient is in respiratory distress on arrival with significant tachypnea and increased respiratory effort.  There seems to be likely component of both CHF and COPD, we will give additional breathing treatments, check chest x-ray as well as VBG.  Her work of breathing does seem to be gradually improving on BiPAP, there may also be a component of anxiety.  Chest x-ray reviewed by me with no infiltrate, edema, or effusion.  Blood gas is reassuring with normal pH and low PCO2, consistent with a level of hyperventilation.  Patient was given a dose of IV Ativan and her respiratory rate seems to be gradually improving.  EKG shows no evidence of  arrhythmia or ischemia and troponin is negative, low suspicion for ACS or PE.  Case discussed with hospitalist for admission.      ____________________________________________   FINAL CLINICAL IMPRESSION(S) / ED DIAGNOSES  Final diagnoses:  COPD exacerbation (HCC)  Acute respiratory failure with hypoxia [facility])     ED Discharge Orders     None        Note:  This document was prepared using Dragon voice recognition software and may include unintentional dictation errors.    Chesley Noon, MD 04/17/21 2011

## 2021-04-18 DIAGNOSIS — J441 Chronic obstructive pulmonary disease with (acute) exacerbation: Secondary | ICD-10-CM

## 2021-04-18 LAB — BASIC METABOLIC PANEL
Anion gap: 6 (ref 5–15)
BUN: 30 mg/dL — ABNORMAL HIGH (ref 6–20)
CO2: 24 mmol/L (ref 22–32)
Calcium: 8.7 mg/dL — ABNORMAL LOW (ref 8.9–10.3)
Chloride: 108 mmol/L (ref 98–111)
Creatinine, Ser: 0.68 mg/dL (ref 0.44–1.00)
GFR, Estimated: >60 mL/min (ref >60)
Glucose, Bld: 126 mg/dL — ABNORMAL HIGH (ref 70–99)
Potassium: 4.1 mmol/L (ref 3.5–5.1)
Sodium: 138 mmol/L (ref 135–145)

## 2021-04-18 LAB — CBC
HCT: 29.5 % — ABNORMAL LOW (ref 36.0–46.0)
Hemoglobin: 9.2 g/dL — ABNORMAL LOW (ref 12.0–15.0)
MCH: 26.4 pg (ref 26.0–34.0)
MCHC: 31.2 g/dL (ref 30.0–36.0)
MCV: 84.5 fL (ref 80.0–100.0)
Platelets: 260 10*3/uL (ref 150–400)
RBC: 3.49 MIL/uL — ABNORMAL LOW (ref 3.87–5.11)
RDW: 14.8 % (ref 11.5–15.5)
WBC: 5.7 10*3/uL (ref 4.0–10.5)
nRBC: 0 % (ref 0.0–0.2)

## 2021-04-18 LAB — GLUCOSE, CAPILLARY: Glucose-Capillary: 127 mg/dL — ABNORMAL HIGH (ref 70–99)

## 2021-04-18 LAB — BLOOD GAS, VENOUS
Acid-Base Excess: 1.8 mmol/L (ref 0.0–2.0)
Bicarbonate: 25.6 mmol/L (ref 20.0–28.0)
Delivery systems: POSITIVE
FIO2: 0.3
O2 Saturation: 97 %
Patient temperature: 37
pCO2, Ven: 36 mmHg — ABNORMAL LOW (ref 44.0–60.0)
pH, Ven: 7.46 — ABNORMAL HIGH (ref 7.250–7.430)
pO2, Ven: 86 mmHg — ABNORMAL HIGH (ref 32.0–45.0)

## 2021-04-18 LAB — RESP PANEL BY RT-PCR (FLU A&B, COVID) ARPGX2
Influenza A by PCR: NEGATIVE
Influenza B by PCR: NEGATIVE
SARS Coronavirus 2 by RT PCR: NEGATIVE

## 2021-04-18 LAB — MRSA NEXT GEN BY PCR, NASAL: MRSA by PCR Next Gen: NOT DETECTED

## 2021-04-18 MED ORDER — LORAZEPAM 2 MG/ML IJ SOLN
0.5000 mg | Freq: Four times a day (QID) | INTRAMUSCULAR | Status: DC
  Administered 2021-04-18: 0.5 mg via INTRAVENOUS
  Filled 2021-04-18: qty 1

## 2021-04-18 MED ORDER — ALBUTEROL SULFATE (2.5 MG/3ML) 0.083% IN NEBU: 10.0000 mg | INHALATION_SOLUTION | Freq: Once | RESPIRATORY_TRACT | Status: AC

## 2021-04-18 MED ORDER — GUAIFENESIN-DM 100-10 MG/5ML PO SYRP
10.0000 mL | ORAL_SOLUTION | Freq: Four times a day (QID) | ORAL | Status: DC
  Administered 2021-04-18 – 2021-04-22 (×16): 10 mL via ORAL
  Filled 2021-04-18 (×15): qty 10

## 2021-04-18 MED ORDER — FUROSEMIDE 20 MG PO TABS
20.0000 mg | ORAL_TABLET | Freq: Two times a day (BID) | ORAL | Status: DC
  Administered 2021-04-18: 20 mg via ORAL
  Filled 2021-04-18 (×2): qty 1

## 2021-04-18 MED ORDER — MORPHINE SULFATE (PF) 2 MG/ML IV SOLN
1.0000 mg | INTRAVENOUS | Status: DC
  Administered 2021-04-18: 1 mg via INTRAVENOUS
  Filled 2021-04-18: qty 1

## 2021-04-18 MED ORDER — CHLORHEXIDINE GLUCONATE CLOTH 2 % EX PADS
6.0000 | MEDICATED_PAD | Freq: Every day | CUTANEOUS | Status: DC
  Administered 2021-04-18 – 2021-04-19 (×2): 6 via TOPICAL

## 2021-04-18 MED ORDER — ACETAMINOPHEN 325 MG PO TABS: 650.0000 mg | ORAL_TABLET | Freq: Four times a day (QID) | ORAL | Status: DC | PRN

## 2021-04-18 MED ORDER — CARVEDILOL 3.125 MG PO TABS
3.1250 mg | ORAL_TABLET | Freq: Two times a day (BID) | ORAL | Status: DC
  Administered 2021-04-18: 3.125 mg via ORAL
  Filled 2021-04-18: qty 1

## 2021-04-18 MED ORDER — DEXMEDETOMIDINE HCL IN NACL 400 MCG/100ML IV SOLN
0.4000 ug/kg/h | INTRAVENOUS | Status: DC
  Administered 2021-04-18: 0.4 ug/kg/h via INTRAVENOUS
  Filled 2021-04-18: qty 100

## 2021-04-18 MED ORDER — ALBUTEROL SULFATE (2.5 MG/3ML) 0.083% IN NEBU
INHALATION_SOLUTION | RESPIRATORY_TRACT | Status: AC
  Filled 2021-04-18: qty 12

## 2021-04-18 MED ORDER — SPIRONOLACTONE 25 MG PO TABS
25.0000 mg | ORAL_TABLET | Freq: Every day | ORAL | Status: DC
  Administered 2021-04-18: 25 mg via ORAL
  Filled 2021-04-18: qty 1

## 2021-04-18 MED ORDER — PANTOPRAZOLE SODIUM 40 MG PO TBEC
40.0000 mg | DELAYED_RELEASE_TABLET | Freq: Every day | ORAL | Status: DC
  Administered 2021-04-18 – 2021-04-22 (×4): 40 mg via ORAL
  Filled 2021-04-18 (×4): qty 1

## 2021-04-18 MED ORDER — HALOPERIDOL LACTATE 5 MG/ML IJ SOLN: 2.0000 mg | Freq: Once | INTRAMUSCULAR | Status: DC

## 2021-04-18 MED ORDER — IPRATROPIUM-ALBUTEROL 0.5-2.5 (3) MG/3ML IN SOLN
3.0000 mL | RESPIRATORY_TRACT | Status: DC
  Administered 2021-04-18 – 2021-04-21 (×19): 3 mL via RESPIRATORY_TRACT
  Filled 2021-04-18 (×19): qty 3

## 2021-04-18 MED ORDER — MORPHINE SULFATE (PF) 2 MG/ML IV SOLN
2.0000 mg | INTRAVENOUS | Status: DC | PRN
  Administered 2021-04-18 – 2021-04-21 (×14): 2 mg via INTRAVENOUS
  Filled 2021-04-18 (×14): qty 1

## 2021-04-18 MED ORDER — BUDESONIDE 0.5 MG/2ML IN SUSP
0.5000 mg | Freq: Two times a day (BID) | RESPIRATORY_TRACT | Status: DC
  Administered 2021-04-18 – 2021-04-22 (×9): 0.5 mg via RESPIRATORY_TRACT
  Filled 2021-04-18 (×9): qty 2

## 2021-04-18 MED ORDER — LORAZEPAM 0.5 MG PO TABS: 0.5000 mg | ORAL_TABLET | Freq: Four times a day (QID) | ORAL | Status: DC | PRN

## 2021-04-18 MED ORDER — ACETAMINOPHEN 650 MG RE SUPP: 650.0000 mg | Freq: Four times a day (QID) | RECTAL | Status: DC | PRN

## 2021-04-18 MED ORDER — LORAZEPAM 2 MG/ML IJ SOLN: 0.5000 mg | Freq: Four times a day (QID) | INTRAMUSCULAR | Status: DC | PRN

## 2021-04-18 MED ORDER — HYDROXYZINE HCL 50 MG PO TABS
50.0000 mg | ORAL_TABLET | Freq: Three times a day (TID) | ORAL | Status: DC | PRN
  Administered 2021-04-18 – 2021-04-21 (×6): 50 mg via ORAL
  Filled 2021-04-18 (×2): qty 1
  Filled 2021-04-18: qty 2
  Filled 2021-04-18 (×4): qty 1

## 2021-04-18 NOTE — Consult Note (Signed)
NAME:  Elizabeth [name], MRN:  [id number], DOB:  1961-03-03, LOS: 1 ADMISSION DATE:  04/17/2021, CONSULTATION DATE:  04/18/21 REFERRING MD: Dr. Fran LowesLai, CHIEF COMPLAINT: Acute respiratory distress  Brief Pt Description / Synopsis:  60 year old female admitted with acute respiratory distress due to severe COPD exacerbation requiring BiPAP.  High risk for intubation.  History of Present Illness:  Elizabeth FellersRita [name] is a 60 year old female with a past medical history as listed below who presented to [facility] ED on 04/17/2021 due to shortness of breath and respiratory distress.  Pt currently with respiratory distress on BiPAP, therefore history is obtained from ED and nursing notes.  Per notes, she presented to her outpatient clinic on 04/17/21 due to progressive dyspnea over the past week.  She was noted to be in respiratory distress with respiratory rate approximately 40, with wheezing and rales in all fields.  EMS was dispatched, and administered DuoNeb x1 along with 125 mg Solu-Medrol and 2g of Magnesium, and was placed on CPAP.  Upon arrival to the ED she was transitioned to BiPAP from CPAP. She reported shortness of breath, dry cough, wheezing, chest tightness., and edema to lower extremities. She denied fever, chills, abdominal pain, nausea, vomiting, diarrhea.  ED course: Vital signs: Afebrile, pulse 112, respirations 40, blood pressure 172/130, O2 saturations 99% on CPAP. Venous blood gas: pH 7.4/PCO2 37/PCO2 69/Bicarb 22.9 Labs: WBC 11.5, Hgb 10.8, BNP 67, HS Troponin 4, glucose 137 Imaging: Chest X-ray with chronic interstitial changes, no active disease. EKG: Sinus tachycardia with incomplete RBBB and nonspecific ST-T wave changes  She was given Duo-nebs and Solu-Medrol, along with IV Ativan for suspected anxiety.  Hospitalists were asked to admit.  Hospital Course: On 04/18/21, rapid response was called due to worsening respiratory distress with RR 50 and accessory muscle use. She was transferred to  ICU.  PCCM is consulted for further assistance and management of acute respiratory distress due to severe COPD exacerbation requiring BiPAP.  High risk for intubation.   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) Hypertension Chronic pain syndrome Anxiety Tobacco abuse  Micro Data:  04/18/2021: SARS-CoV-2 and influenza PCR>> negative  Antimicrobials:  N/A  Significant Hospital Events: Including procedures, antibiotic start and stop dates in addition to other pertinent events   04/17/21: Admitted to Med-Surg for COPD exacerbation 04/18/21: RR called due to respiratory distress, transferred to ICU for BiPAP; PCCM consulted, High risk for intubation  Interim History / Subjective:  Rapid response called due to respiratory distress with respiratory rate in the 40s despite BiPAP -PCCM consulted for assistance with respiratory distress -Patient is awake and alert -Reports shortness of breath, dry cough, wheezing, chest tightness, edema -Denies dizziness, palpitations, hemoptysis, abdominal pain, N/V/D, fever, chills -High risk for intubation -Will increase frequency of bronchodilators to every 4h, will give continuous neb x1, start budesonide nebs, and Precedex as needed to assist with anxiety   Objective   Blood pressure 109/69, pulse 70, temperature 98.2 F (36.8 C), temperature source Axillary, resp. rate (!) 50, height 4\' 9"  (1.448 m), weight 61.5 kg, SpO2 100 %.    FiO2 (%):  [35 %-50 %] 40 %   Intake/Output Summary (Last 24 hours) at 04/18/2021 1349 Last data filed at 04/18/2021 0950 Gross per 24 hour  Intake 60 ml  Output --  Net 60 ml   Filed Weights   04/17/21 1743 04/18/21 0002 04/18/21 1315  Weight: 45 kg 62.9 kg 61.5 kg  Examination: General: Acutely ill-appearing female, sitting in bed, with moderate respiratory distress despite being on BiPAP, very anxious HENT: Atraumatic, normocephalic, neck supple, no  JVD Lungs: Inspiratory and expiratory wheezing throughout, no rhonchi auscultated, tachypnea with accessory muscle use on BiPAP, even Cardiovascular: Regular rate and rhythm, S1-S2, no murmurs, rubs, gallops, 2+ distal pulses Abdomen: Soft, nontender, nondistended, no guarding rebound tenderness, bowel sounds positive x4 Extremities: Normal bulk and tone, no deformities, no edema, good peripheral circulation Neuro: Awake, alert and oriented x4, moves all extremities to commands, no focal deficits, speech clear GU: Deferred Skin: Warm and dry.  No obvious rashes, lesions, ulcerations  Resolved Hospital Problem list     Assessment & Plan:   Severe Acute COPD Exacerbation -Supplemental O2 as needed to maintain O2 sats 88 to 92% -BiPAP, wean as tolerated -High risk for intubation -Follow intermittent Chest X-ray & ABG as needed -Bronchodilators q4h and Budesonide Nebs -IV Solu-Medrol -Will give continuous neb x1 -Prn Morphine for air hunger   Chronic combined Systolic & Diastolic CHF without acute exacerbation Hypertension -Continuous cardiac monitoring -Maintain MAP >65 -HS Troponin negative x2 (3 ~ 4) -BNP 67 -Most recent ECHO in 2019 with LVEF 45% and grade 1 Diastolic Dysfunction -Diuresis as BP and renal function permits (currently on 20 mg Lasix BID) -Continue home Coreg   Generalized Anxiety Disorder Chronic Pain Syndrome -Provide supportive care -PRN Ativan/Xanax -Precedex if needed given severe anxiety and respiratory distress     Pt with respiratory distress, high risk for intubation.  Prognosis is guarded.  Best Practice (right click and "Reselect all SmartList Selections" daily)   Diet/type: NPO DVT prophylaxis: LMWH GI prophylaxis: PPI Lines: N/A Foley:  N/A Code Status:  full code Last date of multidisciplinary goals of care discussion [N/A]  Discussed with pt at bedside 04/18/21, she is in agreement with intubation if needed.  Labs   CBC: Recent  Labs  Lab 04/17/21 1740 04/18/21 0441  WBC 11.5* 5.7  NEUTROABS 9.2*  --   HGB 10.8* 9.2*  HCT 35.5* 29.5*  MCV 87.4 84.5  PLT 388 260    Basic Metabolic Panel: Recent Labs  Lab 04/17/21 1740 04/18/21 0441  NA 141 138  K 4.5 4.1  CL 107 108  CO2 22 24  GLUCOSE 137* 126*  BUN 28* 30*  CREATININE 0.84 0.68  CALCIUM 9.4 8.7*   GFR: Estimated Creatinine Clearance: 56.4 mL/min (by C-G formula based on SCr of 0.68 mg/dL). Recent Labs  Lab 04/17/21 1740 04/18/21 0441  WBC 11.5* 5.7    Liver Function Tests: No results for input(s): AST, ALT, ALKPHOS, BILITOT, PROT, ALBUMIN in the last 168 hours. No results for input(s): LIPASE, AMYLASE in the last 168 hours. No results for input(s): AMMONIA in the last 168 hours.  ABG    Component Value Date/Time   HCO3 22.9 04/17/2021 1749   ACIDBASEDEF 1.6 04/17/2021 1749   O2SAT 93.5 04/17/2021 1749     Coagulation Profile: No results for input(s): INR, PROTIME in the last 168 hours.  Cardiac Enzymes: No results for input(s): CKTOTAL, CKMB, CKMBINDEX, TROPONINI in the last 168 hours.  HbA1C: Hgb A1c MFr Bld  Date/Time Value Ref Range Status  01/04/2021 06:29 AM 5.9 (H) 4.8 - 5.6 % Final    Comment:    (NOTE) Pre diabetes:          5.7%-6.4%  Diabetes:              >6.4%  Glycemic control for   <  7.0% adults with diabetes     CBG: Recent Labs  Lab 04/18/21 1311  GLUCAP 127*    Review of Systems:   Positives in BOLD: Gen: Denies fever, chills, weight change, fatigue, night sweats HEENT: Denies blurred vision, double vision, hearing loss, tinnitus, sinus congestion, rhinorrhea, sore throat, neck stiffness, dysphagia PULM: Denies shortness of breath, cough, sputum production, hemoptysis, wheezing CV: Denies chest tightness, edema, orthopnea, paroxysmal nocturnal dyspnea, palpitations GI: Denies abdominal pain, nausea, vomiting, diarrhea, hematochezia, melena, constipation, change in bowel habits GU: Denies  dysuria, hematuria, polyuria, oliguria, urethral discharge Endocrine: Denies hot or cold intolerance, polyuria, polyphagia or appetite change Derm: Denies rash, dry skin, scaling or peeling skin change Heme: Denies easy bruising, bleeding, bleeding gums Neuro: Denies headache, numbness, weakness, slurred speech, loss of memory or consciousness   Past Medical History:  She,  has a past medical history of Anxiety, Arm pain (07/25/2015), CHF (congestive heart failure) (HCC), Congestive heart failure (HCC) (07/25/2015), COPD (chronic obstructive pulmonary disease) (HCC), Depression, Hypertension, Lower extremity pain (07/25/2015), Reflux, RSD (reflex sympathetic dystrophy), Spinal cord stimulator status, Vitamin B12 deficiency, and Vitamin D deficiency.   Surgical History:   Past Surgical History:  Procedure Laterality Date   SPINAL CORD STIMULATOR IMPLANT     x 2     Social History:   reports that she quit smoking about 3 years ago. Her smoking use included cigarettes. She has a 60.00 pack-year smoking history. She has never used smokeless tobacco. She reports that she does not drink alcohol and does not use drugs.   Family History:  Her family history includes Cancer in her father; Cirrhosis in her mother.   Allergies Allergies  Allergen Reactions   Lisinopril Anaphylaxis     Home Medications  Prior to Admission medications   Medication Sig Start Date End Date Taking? Authorizing Provider  atorvastatin (LIPITOR) 40 MG tablet Take 1 tablet (40 mg total) by mouth at bedtime. 08/18/18  Yes Enid Baas, MD  carvedilol (COREG) 3.125 MG tablet Take 1 tablet (3.125 mg total) by mouth 2 (two) times daily with a meal. 08/18/18  Yes Enid Baas, MD  cetirizine (ZYRTEC) 10 MG tablet Take 10 mg by mouth daily.   Yes [provider]  furosemide (LASIX) 20 MG tablet Take 1 tablet (20 mg total) by mouth daily. Patient taking differently: Take 20 mg by mouth 2 (two) times  daily. 08/18/18  Yes Enid Baas, MD  HYDROcodone-acetaminophen (NORCO/VICODIN) 5-325 MG tablet Take 1 tablet by mouth every 6 (six) hours as needed for severe pain. Must last 30 days. 03/23/21 04/22/21 Yes Delano Metz, MD  mometasone-formoterol (DULERA) 100-5 MCG/ACT AERO Inhale 2 puffs into the lungs 2 (two) times daily. 08/18/18 04/17/21 Yes Enid Baas, MD  pantoprazole (PROTONIX) 40 MG tablet Take 40 mg by mouth daily. 03/19/21  Yes [provider]  potassium chloride SA (K-DUR,KLOR-CON) 20 MEQ tablet Take 20 mEq by mouth daily.   Yes [provider]  spironolactone (ALDACTONE) 25 MG tablet Take 25 mg by mouth daily. 06/29/19 04/17/21 Yes [provider]  albuterol (PROVENTIL HFA;VENTOLIN HFA) 108 (90 Base) MCG/ACT inhaler Inhale 2 puffs into the lungs every 6 (six) hours as needed for wheezing or shortness of breath. 11/26/17   Enid Baas, MD  HYDROcodone-acetaminophen (NORCO/VICODIN) 5-325 MG tablet Take 1 tablet by mouth every 6 (six) hours as needed for severe pain. Must last 30 days. 02/21/21 03/23/21  Delano Metz, MD  HYDROcodone-acetaminophen (NORCO/VICODIN) 5-325 MG tablet Take 1 tablet by  mouth every 6 (six) hours as needed for severe pain. Must last 30 days. 04/22/21 05/22/21  Delano Metz, MD  ipratropium-albuterol (DUONEB) 0.5-2.5 (3) MG/3ML SOLN Take 3 mLs by nebulization every 6 (six) hours as needed (wheezing). 01/06/21   Enedina Finner, MD  omeprazole (PRILOSEC) 20 MG capsule Take 20 mg by mouth daily.  Patient not taking: Reported on 04/17/2021    [provider]  ramelteon (ROZEREM) 8 MG tablet Take 8 mg by mouth at bedtime. Patient not taking: Reported on 04/17/2021    [provider]     Critical care time: 50 minutes     Harlon Ditty, AGACNP-BC [facility] Prefer epic messenger for cross cover needs If after hours, please call E-link

## 2021-04-18 NOTE — Significant Event (Addendum)
Rapid Response Event Note   Reason for Call : Increased WOB, hyperventilation, RR 50 PM   Initial Focused Assessment: 35% FIO2 on BiPap, O2 sats WDL, RR approx 50, facial puffing around bipap mask w/every exhalation, using accessory muscles to breathe including shoulder shrugging w/inhalation      Interventions: increased FIO2 to 50%, reviewed chart, consulted w/ assigned RN, PCU Charge RN, and hospitalist.  Labs including VBG ok, pt previously medicated for anxiety and WOB   Plan of Care: Transfer to ICU 7 for closer monitoring and possible precedex gtt as much of this appears to be anxiety driven    Event Summary: Pt assessed by this RN and physician, transfer to ICU  MD Notified: Fran Lowes Call Time: 12:57 Arrival Time: 12:57 End Time: 13:10  Claude Manges, RN

## 2021-04-18 NOTE — Progress Notes (Signed)
Elizabeth [name] was admitted from the ED about 0000 on 7/8.  Patient arrived to 2A on BiPap accompanied by RT.  Patient in respiratory distress w/ dyspnea at rest and on any exertion.  Patient unable to tolerate nasal cannula or being off BiPap at present.  Vital signs obtained.  RR is 35-40+ on admission.  Patient visibly hyperventilating.  Yellow MEWS triggered by patient's respirations.  Dr. Para March covering, notified.  Nurse attempted to reassure patient.  Dr. Para March ordered prn IV ativan for patient.  When nurse went to reassess patient, patient was asleep and RR was 16-20.  As soon as patient was awakened, pt RR was 35-40+.  Patient given prn hydrocodone for patient.  Will monitor for anxiety.

## 2021-04-18 NOTE — Progress Notes (Signed)
PROGRESS NOTE    [place] Gayden Binstock  SEG:[id number] DOB: July 28, 1961 DOA: 04/17/2021 PCP: Lorn Junes, FNP  [id number]   Assessment & Plan:   Principal Problem:   COPD with acute exacerbation (HCC) Active Problems:   Generalized anxiety disorder   Chronic pain syndrome   Congestive heart failure (HCC)   HTN (hypertension)   History of tobacco abuse   Arlone Lenhardt Montesinos is a 60 y.o. female with medical history significant for COPD, last hospitalized 3 months prior, anxiety, CHF with last echo 2019 showing EF 45% and grade 1 diastolic dysfunction, chronic pain syndrome on chronic opiates, HTN and tobacco use disorder who was brought in by EMS in respiratory distress with tachypnea in the 40s to 50s requiring CPAP.  She reported chest tightness with breathing but denied nausea, vomiting or diaphoresis.  Denied cough, fever or chills, abdominal pain or diarrhea. ED course: On arrival BP 172/130 with pulse 112 respirations 40 and afebrile and O2 sat 99% on CPAP.  She was transitioned to BiPAP on arrival.  Blood work significant for venous blood gas with pH 7.40, PCO2 37 and PO2 69, troponin of 4 and BNP 67.2.     Severe respiratory distress COPD with acute exacerbation  -Patient very tachypneic but with reassuring labs, VBG pH 7.40, PCO2 37 and PO2 69, troponin of 4 and BNP 67  --rapid RR appeared to be due in part to severe anxiety --No signs of PNA or fluid overload --started on IV solumedrol  Plan: --cont BiPAP --transfer to stepdown --precedex gtt --pulm consult --cont scheduled nebs --cont steroid as solumedrol with taper --IV Morphine PRN for air hunger     Generalized anxiety disorder - anxiety was not improved with ativan --transfer to stepdown for precedex gtt     Chronic pain syndrome on chronic opioids - IV morphine PRN ordered instead of home opioids     Congestive heart failure, chronic, combined - Last echo 2019 with EF 45% and grade 1 diastolic  dysfunction - BNP normal at 67 and chest x-ray with chronic findings only and with normal troponin - CHF exacerbation not suspected Plan: --Hold home coreg, lasix and aldactone due to decreasing HR and BP in the setting of precedex gtt     HTN (hypertension) --Hold home coreg, lasix and aldactone due to decreasing HR and BP in the setting of precedex gtt   DVT prophylaxis: Lovenox SQ Code Status: Full code  Family Communication:  Level of care: ICU Dispo:   The patient is from: home Anticipated d/c is to: home Anticipated d/c date is: >3 days Patient currently is not medically ready to d/c due to: on BiPAP and precedex gtt   Subjective and Interval History:  Pt was found to be very anxious, and having RR in the 50's even while on BiPAP.  Multiple doses of home Norco, IV ativan, IV morphine, were not enough to sedate pt.    Rapid called, and helped transfer pt to stepdown for precedex gtt and in case pt needs to be intubated.   Objective: Vitals:   04/18/21 1345 04/18/21 1400 04/18/21 1418 04/18/21 1645  BP: 95/68 104/60    Pulse: 65 (!) 55 63 (!) 55  Resp: (!) 41 (!) 45 (!) 25 20  Temp:      TempSrc:      SpO2: 100% 100% 95% 100%  Weight:      Height:        Intake/Output Summary (Last 24 hours) at 04/18/2021 1734  Last data filed at 04/18/2021 1400 Gross per 24 hour  Intake 60.46 ml  Output --  Net 60.46 ml   Filed Weights   04/17/21 1743 04/18/21 0002 04/18/21 1315  Weight: 45 kg 62.9 kg 61.5 kg    Examination:   Constitutional: in distress, AAOx3 HEENT: conjunctivae and lids normal, EOMI CV: No cyanosis.   RESP: increased RR, on BiPAP SKIN: warm, dry Neuro: II - XII grossly intact.   Psych: anxious mood and affect.     Data Reviewed: I have personally reviewed following labs and imaging studies  CBC: Recent Labs  Lab 04/17/21 1740 04/18/21 0441  WBC 11.5* 5.7  NEUTROABS 9.2*  --   HGB 10.8* 9.2*  HCT 35.5* 29.5*  MCV 87.4 84.5  PLT 388 260    Basic Metabolic Panel: Recent Labs  Lab 04/17/21 1740 04/18/21 0441  NA 141 138  K 4.5 4.1  CL 107 108  CO2 22 24  GLUCOSE 137* 126*  BUN 28* 30*  CREATININE 0.84 0.68  CALCIUM 9.4 8.7*   GFR: Estimated Creatinine Clearance: 56.4 mL/min (by C-G formula based on SCr of 0.68 mg/dL). Liver Function Tests: No results for input(s): AST, ALT, ALKPHOS, BILITOT, PROT, ALBUMIN in the last 168 hours. No results for input(s): LIPASE, AMYLASE in the last 168 hours. No results for input(s): AMMONIA in the last 168 hours. Coagulation Profile: No results for input(s): INR, PROTIME in the last 168 hours. Cardiac Enzymes: No results for input(s): CKTOTAL, CKMB, CKMBINDEX, TROPONINI in the last 168 hours. BNP (last 3 results) No results for input(s): PROBNP in the last 8760 hours. HbA1C: No results for input(s): HGBA1C in the last 72 hours. CBG: Recent Labs  Lab 04/18/21 1311  GLUCAP 127*   Lipid Profile: No results for input(s): CHOL, HDL, LDLCALC, TRIG, CHOLHDL, LDLDIRECT in the last 72 hours. Thyroid Function Tests: No results for input(s): TSH, T4TOTAL, FREET4, T3FREE, THYROIDAB in the last 72 hours. Anemia Panel: No results for input(s): VITAMINB12, FOLATE, FERRITIN, TIBC, IRON, RETICCTPCT in the last 72 hours. Sepsis Labs: No results for input(s): PROCALCITON, LATICACIDVEN in the last 168 hours.  Recent Results (from the past 240 hour(s))  Resp Panel by RT-PCR (Flu A&B, Covid) Nasopharyngeal Swab     Status: None   Collection Time: 04/18/21 10:45 AM   Specimen: Nasopharyngeal Swab; Nasopharyngeal(NP) swabs in vial transport medium  Result Value Ref Range Status   SARS Coronavirus 2 by RT PCR NEGATIVE NEGATIVE Final    Comment: (NOTE) SARS-CoV-2 target nucleic acids are NOT DETECTED.  The SARS-CoV-2 RNA is generally detectable in upper respiratory specimens during the acute phase of infection. The lowest concentration of SARS-CoV-2 viral copies this assay can detect  is 138 copies/mL. A negative result does not preclude SARS-Cov-2 infection and should not be used as the sole basis for treatment or other patient management decisions. A negative result may occur with  improper specimen collection/handling, submission of specimen other than nasopharyngeal swab, presence of viral mutation(s) within the areas targeted by this assay, and inadequate number of viral copies(<138 copies/mL). A negative result must be combined with clinical observations, patient history, and epidemiological information. The expected result is Negative.  Fact Sheet for Patients:  BloggerCourse.com  Fact Sheet for Healthcare Providers:  SeriousBroker.it  This test is no t yet approved or cleared by the [place] FDA and  has been authorized for detection and/or diagnosis of SARS-CoV-2 by FDA under an Emergency Use Authorization (EUA). This EUA will remain  in effect (meaning this test can be used) for the duration of the COVID-19 declaration under Section 564(b)(1) of the Act, 21 U.S.C.section 360bbb-3(b)(1), unless the authorization is terminated  or revoked sooner.       Influenza A by PCR NEGATIVE NEGATIVE Final   Influenza B by PCR NEGATIVE NEGATIVE Final    Comment: (NOTE) The Xpert Xpress SARS-CoV-2/FLU/RSV plus assay is intended as an aid in the diagnosis of influenza from Nasopharyngeal swab specimens and should not be used as a sole basis for treatment. Nasal washings and aspirates are unacceptable for Xpert Xpress SARS-CoV-2/FLU/RSV testing.  Fact Sheet for Patients: BloggerCourse.com  Fact Sheet for Healthcare Providers: SeriousBroker.it  This test is not yet approved or cleared by the [place] FDA and has been authorized for detection and/or diagnosis of SARS-CoV-2 by FDA under an Emergency Use Authorization (EUA). This EUA will remain in effect  (meaning this test can be used) for the duration of the COVID-19 declaration under Section 564(b)(1) of the Act, 21 U.S.C. section 360bbb-3(b)(1), unless the authorization is terminated or revoked.  Performed at [facility], [address] [place], [place] [address]   MRSA Next Gen by PCR, Nasal     Status: None   Collection Time: 04/18/21  3:15 PM   Specimen: Nasal Mucosa; Nasal Swab  Result Value Ref Range Status   MRSA by PCR Next Gen NOT DETECTED NOT DETECTED Final    Comment: (NOTE) The GeneXpert MRSA Assay (FDA approved for NASAL specimens only), is one component of a comprehensive MRSA colonization surveillance program. It is not intended to diagnose MRSA infection nor to guide or monitor treatment for MRSA infections. Test performance is not FDA approved in patients less than 63 years old. Performed at [facility], [address] [place], [place] [address]       Radiology Studies: DG Chest Portable 1 View  Result Date: 04/17/2021 CLINICAL DATA:  Shortness of breath. EXAM: PORTABLE CHEST 1 VIEW COMPARISON:  01/03/2021 FINDINGS: The lungs are clear without focal pneumonia, edema, pneumothorax or pleural effusion. Interstitial markings are diffusely coarsened with chronic features. Cardiopericardial silhouette is at upper limits of normal for size. The visualized bony structures of the thorax show no acute abnormality. Cervicothoracic spinal stimulator leads again noted. Telemetry leads overlie the chest. IMPRESSION: Chronic interstitial coarsening.  No active disease. Electronically Signed   By: Kennith Center M.D.   On: 04/17/2021 18:32     Scheduled Meds:  budesonide (PULMICORT) nebulizer solution  0.5 mg Nebulization BID   carvedilol  3.125 mg Oral BID WC   Chlorhexidine Gluconate Cloth  6 each Topical Daily   enoxaparin (LOVENOX) injection  40 mg Subcutaneous Q24H   furosemide  20 mg Oral BID   guaiFENesin-dextromethorphan  10 mL Oral Q6H    haloperidol lactate  2 mg Intravenous Once   ipratropium-albuterol  3 mL Nebulization Q4H   pantoprazole  40 mg Oral Daily   [START ON 04/19/2021] predniSONE  40 mg Oral Q breakfast   spironolactone  25 mg Oral Daily   Continuous Infusions:  dexmedetomidine (PRECEDEX) IV infusion 0.4 mcg/kg/hr (04/18/21 1400)     LOS: 1 day     Darlin Priestly, MD [facility] If 7PM-7AM, please contact night-coverage 04/18/2021, 5:34 PM

## 2021-04-18 NOTE — Plan of Care (Signed)
  Problem: Education: Goal: Knowledge of General Education information will improve Description Including pain rating scale, medication(s)/side effects and non-pharmacologic comfort measures Outcome: Progressing   Problem: Health Behavior/Discharge Planning: Goal: Ability to manage health-related needs will improve Outcome: Progressing   Problem: Clinical Measurements: Goal: Ability to maintain clinical measurements within normal limits will improve Outcome: Progressing Goal: Will remain free from infection Outcome: Progressing Goal: Diagnostic test results will improve Outcome: Progressing Goal: Respiratory complications will improve Outcome: Progressing Goal: Cardiovascular complication will be avoided Outcome: Progressing   Problem: Activity: Goal: Risk for activity intolerance will decrease Outcome: Progressing   Problem: Nutrition: Goal: Adequate nutrition will be maintained Outcome: Progressing   Problem: Coping: Goal: Level of anxiety will decrease Outcome: Progressing   Problem: Elimination: Goal: Will not experience complications related to bowel motility Outcome: Progressing Goal: Will not experience complications related to urinary retention Outcome: Progressing   Problem: Pain Managment: Goal: General experience of comfort will improve Outcome: Progressing   Problem: Safety: Goal: Ability to remain free from injury will improve Outcome: Progressing   Problem: Skin Integrity: Goal: Risk for impaired skin integrity will decrease Outcome: Progressing   Problem: Education: Goal: Knowledge of disease or condition will improve Outcome: Progressing Goal: Knowledge of the prescribed therapeutic regimen will improve Outcome: Progressing   Problem: Activity: Goal: Ability to tolerate increased activity will improve Outcome: Progressing Goal: Will verbalize the importance of balancing activity with adequate rest periods Outcome: Progressing   Problem:  Respiratory: Goal: Ability to maintain a clear airway will improve Outcome: Progressing Goal: Levels of oxygenation will improve Outcome: Progressing Goal: Ability to maintain adequate ventilation will improve Outcome: Progressing   

## 2021-04-18 NOTE — Progress Notes (Signed)
Transported pt to CCU on BIPAP.

## 2021-04-19 LAB — BASIC METABOLIC PANEL
Anion gap: 8 (ref 5–15)
BUN: 30 mg/dL — ABNORMAL HIGH (ref 6–20)
CO2: 25 mmol/L (ref 22–32)
Calcium: 8.7 mg/dL — ABNORMAL LOW (ref 8.9–10.3)
Chloride: 104 mmol/L (ref 98–111)
Creatinine, Ser: 0.61 mg/dL (ref 0.44–1.00)
GFR, Estimated: >60 mL/min (ref >60)
Glucose, Bld: 111 mg/dL — ABNORMAL HIGH (ref 70–99)
Potassium: 3.7 mmol/L (ref 3.5–5.1)
Sodium: 137 mmol/L (ref 135–145)

## 2021-04-19 LAB — PHOSPHORUS: Phosphorus: 4.7 mg/dL — ABNORMAL HIGH (ref 2.5–4.6)

## 2021-04-19 LAB — CBC
HCT: 30.1 % — ABNORMAL LOW (ref 36.0–46.0)
Hemoglobin: 9.6 g/dL — ABNORMAL LOW (ref 12.0–15.0)
MCH: 27.7 pg (ref 26.0–34.0)
MCHC: 31.9 g/dL (ref 30.0–36.0)
MCV: 87 fL (ref 80.0–100.0)
Platelets: 279 10*3/uL (ref 150–400)
RBC: 3.46 MIL/uL — ABNORMAL LOW (ref 3.87–5.11)
RDW: 14.5 % (ref 11.5–15.5)
WBC: 5.1 10*3/uL (ref 4.0–10.5)
nRBC: 0 % (ref 0.0–0.2)

## 2021-04-19 LAB — MAGNESIUM: Magnesium: 2.8 mg/dL — ABNORMAL HIGH (ref 1.7–2.4)

## 2021-04-19 MED ORDER — FUROSEMIDE 20 MG PO TABS
20.0000 mg | ORAL_TABLET | Freq: Two times a day (BID) | ORAL | Status: DC
  Administered 2021-04-19 – 2021-04-20 (×2): 20 mg via ORAL
  Filled 2021-04-19 (×3): qty 1

## 2021-04-19 NOTE — Progress Notes (Signed)
?  Patient watched me several times as I passed by her room. She is on CPAP machine and was not able to respond to me verbally. I spoke to her and asked if I could pray with her. Patient seemed to be very receptive of visit and prayer.

## 2021-04-19 NOTE — Progress Notes (Addendum)
NAME:  Elizabeth [name], MRN:  [id number], DOB:  1961-06-01, LOS: 2 ADMISSION DATE:  04/17/2021, CONSULTATION DATE:  04/18/21 REFERRING MD: Dr. Fran Lowes, CHIEF COMPLAINT: Acute respiratory distress  Brief Pt Description / Synopsis:  60 year old female admitted with acute respiratory distress due to severe COPD exacerbation requiring BiPAP.  High risk for intubation.  History of Present Illness:  Elizabeth [name] is a 60 year old female with a past medical history as listed below who presented to [facility] ED on 04/17/2021 due to shortness of breath and respiratory distress.  Pt currently with respiratory distress on BiPAP, therefore history is obtained from ED and nursing notes.  Per notes, she presented to her outpatient clinic on 04/17/21 due to progressive dyspnea over the past week.  She was noted to be in respiratory distress with respiratory rate approximately 40, with wheezing and rales in all fields.  EMS was dispatched, and administered DuoNeb x1 along with 125 mg Solu-Medrol and 2g of Magnesium, and was placed on CPAP.  Upon arrival to the ED she was transitioned to BiPAP from CPAP. She reported shortness of breath, dry cough, wheezing, chest tightness., and edema to lower extremities. She denied fever, chills, abdominal pain, nausea, vomiting, diarrhea.  ED course: Vital signs: Afebrile, pulse 112, respirations 40, blood pressure 172/130, O2 saturations 99% on CPAP. Venous blood gas: pH 7.4/PCO2 37/PCO2 69/Bicarb 22.9 Labs: WBC 11.5, Hgb 10.8, BNP 67, HS Troponin 4, glucose 137 Imaging: Chest X-ray with chronic interstitial changes, no active disease. EKG: Sinus tachycardia with incomplete RBBB and nonspecific ST-T wave changes  She was given Duo-nebs and Solu-Medrol, along with IV Ativan for suspected anxiety.  Hospitalists were asked to admit.  Events:   04/17/21: Admitted to Med-Surg for COPD exacerbation 04/18/21: RR called due to respiratory distress, transferred to ICU for BiPAP; PCCM  consulted, High risk for intubation rapid response was called due to worsening respiratory distress with RR 50 and accessory muscle use. She was transferred to ICU. 04/19/21- patient improved, she has not had overnight events acutely.  Blood work is normal. PCCM is consulted for further assistance and management of acute respiratory distress due to severe COPD exacerbation requiring BiPAP.  High risk for intubation.   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) Hypertension Chronic pain syndrome Anxiety Tobacco abuse  Micro Data:  04/18/2021: SARS-CoV-2 and influenza PCR>> negative  Antimicrobials:  N/A   Interim History / Subjective:  Rapid response called due to respiratory distress with respiratory rate in the 40s despite BiPAP -PCCM consulted for assistance with respiratory distress -Patient is awake and alert -Reports shortness of breath, dry cough, wheezing, chest tightness, edema -Denies dizziness, palpitations, hemoptysis, abdominal pain, N/V/D, fever, chills -High risk for intubation -Will increase frequency of bronchodilators to every 4h, will give continuous neb x1, start budesonide nebs, and Precedex as needed to assist with anxiety   Objective   Blood pressure 109/64, pulse (!) 59, temperature (!) 97.4 F (36.3 C), temperature source Axillary, resp. rate (!) 29, height 4\' 9"  (1.448 m), weight 61.5 kg, SpO2 99 %.    FiO2 (%):  [30 %-40 %] 30 %   Intake/Output Summary (Last 24 hours) at 04/19/2021 1317 Last data filed at 04/19/2021 0751 Gross per 24 hour  Intake 36.02 ml  Output 900 ml  Net -863.98 ml    Filed Weights   04/17/21 1743 04/18/21 0002 04/18/21 1315  Weight: 45 kg 62.9 kg 61.5 kg  Examination: General: Acutely ill-appearing female, sitting in bed, with moderate respiratory distress despite being on BiPAP, very anxious HENT: Atraumatic, normocephalic, neck supple, no JVD Lungs: Inspiratory and  expiratory wheezing throughout, no rhonchi auscultated, tachypnea with accessory muscle use on BiPAP, even Cardiovascular: Regular rate and rhythm, S1-S2, no murmurs, rubs, gallops, 2+ distal pulses Abdomen: Soft, nontender, nondistended, no guarding rebound tenderness, bowel sounds positive x4 Extremities: Normal bulk and tone, no deformities, no edema, good peripheral circulation Neuro: Awake, alert and oriented x4, moves all extremities to commands, no focal deficits, speech clear GU: Deferred Skin: Warm and dry.  No obvious rashes, lesions, ulcerations  Resolved Hospital Problem list     Assessment & Plan:   Severe Acute COPD Exacerbation -Supplemental O2 as needed to maintain O2 sats 88 to 92% -BiPAP, wean as tolerated -High risk for intubation -Follow intermittent Chest X-ray & ABG as needed -Bronchodilators q4h and Budesonide Nebs -IV Solu-Medrol -Will give continuous neb x1 -Prn Morphine for air hunger   Chronic combined Systolic & Diastolic CHF without acute exacerbation Hypertension -Continuous cardiac monitoring -Maintain MAP >65 -HS Troponin negative x2 (3 ~ 4) -BNP 67 -Most recent ECHO in 2019 with LVEF 45% and grade 1 Diastolic Dysfunction -Diuresis as BP and renal function permits (currently on 20 mg Lasix BID) -Continue home Coreg   Generalized Anxiety Disorder Chronic Pain Syndrome -Provide supportive care -PRN Ativan/Xanax -Precedex if needed given severe anxiety and respiratory distress     Pt with respiratory distress, high risk for intubation.  Prognosis is guarded.  Best Practice (right click and "Reselect all SmartList Selections" daily)   Diet/type: NPO DVT prophylaxis: LMWH GI prophylaxis: PPI Lines: N/A Foley:  N/A Code Status:  full code Last date of multidisciplinary goals of care discussion [N/A]  Discussed with pt at bedside 04/18/21, she is in agreement with intubation if needed.  Labs   CBC: Recent Labs  Lab 04/17/21 1740  04/18/21 0441 04/19/21 0457  WBC 11.5* 5.7 5.1  NEUTROABS 9.2*  --   --   HGB 10.8* 9.2* 9.6*  HCT 35.5* 29.5* 30.1*  MCV 87.4 84.5 87.0  PLT 388 260 279     Basic Metabolic Panel: Recent Labs  Lab 04/17/21 1740 04/18/21 0441 04/19/21 0457  NA 141 138 137  K 4.5 4.1 3.7  CL 107 108 104  CO2 22 24 25   GLUCOSE 137* 126* 111*  BUN 28* 30* 30*  CREATININE 0.84 0.68 0.61  CALCIUM 9.4 8.7* 8.7*  MG  --   --  2.8*  PHOS  --   --  4.7*    GFR: Estimated Creatinine Clearance: 56.4 mL/min (by C-G formula based on SCr of 0.61 mg/dL). Recent Labs  Lab 04/17/21 1740 04/18/21 0441 04/19/21 0457  WBC 11.5* 5.7 5.1     Liver Function Tests: No results for input(s): AST, ALT, ALKPHOS, BILITOT, PROT, ALBUMIN in the last 168 hours. No results for input(s): LIPASE, AMYLASE in the last 168 hours. No results for input(s): AMMONIA in the last 168 hours.  ABG    Component Value Date/Time   HCO3 25.6 04/18/2021 1656   ACIDBASEDEF 1.6 04/17/2021 1749   O2SAT 97.0 04/18/2021 1656      Coagulation Profile: No results for input(s): INR, PROTIME in the last 168 hours.  Cardiac Enzymes: No results for input(s): CKTOTAL, CKMB, CKMBINDEX, TROPONINI in the last 168 hours.  HbA1C: Hgb A1c MFr Bld  Date/Time Value Ref Range Status  01/04/2021 06:29 AM 5.9 (H) 4.8 -  5.6 % Final    Comment:    (NOTE) Pre diabetes:          5.7%-6.4%  Diabetes:              >6.4%  Glycemic control for   <7.0% adults with diabetes     CBG: Recent Labs  Lab 04/18/21 1311  GLUCAP 127*     Review of Systems:   Positives in BOLD: Gen: Denies fever, chills, weight change, fatigue, night sweats HEENT: Denies blurred vision, double vision, hearing loss, tinnitus, sinus congestion, rhinorrhea, sore throat, neck stiffness, dysphagia PULM: Denies shortness of breath, cough, sputum production, hemoptysis, wheezing CV: Denies chest tightness, edema, orthopnea, paroxysmal nocturnal dyspnea,  palpitations GI: Denies abdominal pain, nausea, vomiting, diarrhea, hematochezia, melena, constipation, change in bowel habits GU: Denies dysuria, hematuria, polyuria, oliguria, urethral discharge Endocrine: Denies hot or cold intolerance, polyuria, polyphagia or appetite change Derm: Denies rash, dry skin, scaling or peeling skin change Heme: Denies easy bruising, bleeding, bleeding gums Neuro: Denies headache, numbness, weakness, slurred speech, loss of memory or consciousness   Past Medical History:  She,  has a past medical history of Anxiety, Arm pain (07/25/2015), CHF (congestive heart failure) (HCC), Congestive heart failure (HCC) (07/25/2015), COPD (chronic obstructive pulmonary disease) (HCC), Depression, Hypertension, Lower extremity pain (07/25/2015), Reflux, RSD (reflex sympathetic dystrophy), Spinal cord stimulator status, Vitamin B12 deficiency, and Vitamin D deficiency.   Surgical History:   Past Surgical History:  Procedure Laterality Date   SPINAL CORD STIMULATOR IMPLANT     x 2     Social History:   reports that she quit smoking about 3 years ago. Her smoking use included cigarettes. She has a 60.00 pack-year smoking history. She has never used smokeless tobacco. She reports that she does not drink alcohol and does not use drugs.   Family History:  Her family history includes Cancer in her father; Cirrhosis in her mother.   Allergies Allergies  Allergen Reactions   Lisinopril Anaphylaxis     Home Medications  Prior to Admission medications   Medication Sig Start Date End Date Taking? Authorizing Provider  atorvastatin (LIPITOR) 40 MG tablet Take 1 tablet (40 mg total) by mouth at bedtime. 08/18/18  Yes Enid Baas, MD  carvedilol (COREG) 3.125 MG tablet Take 1 tablet (3.125 mg total) by mouth 2 (two) times daily with a meal. 08/18/18  Yes Enid Baas, MD  cetirizine (ZYRTEC) 10 MG tablet Take 10 mg by mouth daily.   Yes [provider]   furosemide (LASIX) 20 MG tablet Take 1 tablet (20 mg total) by mouth daily. Patient taking differently: Take 20 mg by mouth 2 (two) times daily. 08/18/18  Yes Enid Baas, MD  HYDROcodone-acetaminophen (NORCO/VICODIN) 5-325 MG tablet Take 1 tablet by mouth every 6 (six) hours as needed for severe pain. Must last 30 days. 03/23/21 04/22/21 Yes Delano Metz, MD  mometasone-formoterol (DULERA) 100-5 MCG/ACT AERO Inhale 2 puffs into the lungs 2 (two) times daily. 08/18/18 04/17/21 Yes Enid Baas, MD  pantoprazole (PROTONIX) 40 MG tablet Take 40 mg by mouth daily. 03/19/21  Yes [provider]  potassium chloride SA (K-DUR,KLOR-CON) 20 MEQ tablet Take 20 mEq by mouth daily.   Yes [provider]  spironolactone (ALDACTONE) 25 MG tablet Take 25 mg by mouth daily. 06/29/19 04/17/21 Yes [provider]  albuterol (PROVENTIL HFA;VENTOLIN HFA) 108 (90 Base) MCG/ACT inhaler Inhale 2 puffs into the lungs every 6 (six) hours as needed for wheezing or shortness of breath.  11/26/17   Enid BaasKalisetti, Radhika, MD  HYDROcodone-acetaminophen (NORCO/VICODIN) 5-325 MG tablet Take 1 tablet by mouth every 6 (six) hours as needed for severe pain. Must last 30 days. 02/21/21 03/23/21  Delano MetzNaveira, Francisco, MD  HYDROcodone-acetaminophen (NORCO/VICODIN) 5-325 MG tablet Take 1 tablet by mouth every 6 (six) hours as needed for severe pain. Must last 30 days. 04/22/21 05/22/21  Delano MetzNaveira, Francisco, MD  ipratropium-albuterol (DUONEB) 0.5-2.5 (3) MG/3ML SOLN Take 3 mLs by nebulization every 6 (six) hours as needed (wheezing). 01/06/21   Enedina FinnerPatel, Sona, MD  omeprazole (PRILOSEC) 20 MG capsule Take 20 mg by mouth daily.  Patient not taking: Reported on 04/17/2021    [provider]  ramelteon (ROZEREM) 8 MG tablet Take 8 mg by mouth at bedtime. Patient not taking: Reported on 04/17/2021    [provider]     Critical care provider statement:    Critical care time (minutes):  33   Critical care  time was exclusive of:  Separately billable procedures and  treating other patients   Critical care was necessary to treat or prevent imminent or  life-threatening deterioration of the following conditions:  Acute on chronic hypoxemic respiratory failure, advanced COPD, multiple comorbid conditions.    Critical care was time spent personally by me on the following  activities:  Development of treatment plan with patient or surrogate,  discussions with consultants, evaluation of patient's response to  treatment, examination of patient, obtaining history from patient or  surrogate, ordering and performing treatments and interventions, ordering  and review of laboratory studies and re-evaluation of patient's condition   I assumed direction of critical care for this patient from another  provider in my specialty: no        Vida RiggerFuad Nyjai Graff, M.D.  Pulmonary & Critical Care Medicine  [facility] [place] [facility]

## 2021-04-19 NOTE — Progress Notes (Signed)
PROGRESS NOTE    Elizabeth [name]  HEN:[id number] DOB: 07-29-1961 DOA: 04/17/2021 PCP: Lorn Junes, FNP  [id number]/[id number]   Assessment & Plan:   Principal Problem:   COPD with acute exacerbation (HCC) Active Problems:   Generalized anxiety disorder   Chronic pain syndrome   Congestive heart failure (HCC)   HTN (hypertension)   History of tobacco abuse   Elizabeth [name] is a 60 y.o. female with medical history significant for COPD, last hospitalized 3 months prior, anxiety, CHF with last echo 2019 showing EF 45% and grade 1 diastolic dysfunction, chronic pain syndrome on chronic opiates, HTN and tobacco use disorder who was brought in by EMS in respiratory distress with tachypnea in the 40s to 50s requiring CPAP.  She reported chest tightness with breathing but denied nausea, vomiting or diaphoresis.  Denied cough, fever or chills, abdominal pain or diarrhea. ED course: On arrival BP 172/130 with pulse 112 respirations 40 and afebrile and O2 sat 99% on CPAP.  She was transitioned to BiPAP on arrival.  Blood work significant for venous blood gas with pH 7.40, PCO2 37 and PO2 69, troponin of 4 and BNP 67.2.     Severe respiratory distress COPD with acute exacerbation  -Patient very tachypneic but with reassuring labs, VBG pH 7.40, PCO2 37 and PO2 69, troponin of 4 and BNP 67  --rapid RR appeared to be due in part to severe anxiety --No signs of PNA or fluid overload --started on IV solumedrol  --transferred to stepdown on 7/8 due to severely elevated RR and distress Plan: --cont BiPAP PRN --steroid switched to prednisone 40 mg daily --DuoNeb q4h --Pulmicort neb BID --IV Morphine PRN for air hunger     Generalized anxiety disorder - anxiety was not improved with ativan --transferred to stepdown for precedex gtt --precedex PRN    Chronic pain syndrome on chronic opioids - IV morphine PRN ordered instead of home opioids     Congestive heart failure, chronic,  combined - Last echo 2019 with EF 45% and grade 1 diastolic dysfunction - BNP normal at 67 and chest x-ray with chronic findings only and with normal troponin - CHF exacerbation not suspected Plan: --resume home lasix, per pt request --cont to hold home coreg and aldactone due to soft BP     HTN (hypertension) --resume home lasix, per pt request --cont to hold home coreg and aldactone due to soft BP   DVT prophylaxis: Lovenox SQ Code Status: Full code  Family Communication:  Level of care: stepdown Dispo:   The patient is from: home Anticipated d/c is to: home Anticipated d/c date is: >3 days Patient currently is not medically ready to d/c due to: respiratory distress   Subjective and Interval History:  Pt was off BiPAP with normal RR.  Pt complained of not feeling good, but couldn't elaborate.  Not wanting to eat.    Objective: Vitals:   04/19/21 1600 04/19/21 1615 04/19/21 1630 04/19/21 1645  BP: 104/61 107/63 100/60 (!) 113/59  Pulse: (!) 56 69 (!) 58 61  Resp: 14 18 13 16   Temp: 98.1 F (36.7 C)     TempSrc: Oral     SpO2: 100% 100% 100% 100%  Weight:      Height:        Intake/Output Summary (Last 24 hours) at 04/19/2021 1726 Last data filed at 04/19/2021 0751 Gross per 24 hour  Intake 27.15 ml  Output 900 ml  Net -872.85 ml   06/20/2021  04/17/21 1743 04/18/21 0002 04/18/21 1315  Weight: 45 kg 62.9 kg 61.5 kg    Examination:   Constitutional: NAD, AAOx3 HEENT: conjunctivae and lids normal, EOMI CV: No cyanosis.   RESP: diffuse wheezes, normal work of breathing, off BiPAP Extremities: No effusions, edema in BLE SKIN: warm, dry Neuro: II - XII grossly intact.   Psych: depressed mood and affect.     Data Reviewed: I have personally reviewed following labs and imaging studies  CBC: Recent Labs  Lab 04/17/21 1740 04/18/21 0441 04/19/21 0457  WBC 11.5* 5.7 5.1  NEUTROABS 9.2*  --   --   HGB 10.8* 9.2* 9.6*  HCT 35.5* 29.5* 30.1*  MCV 87.4  84.5 87.0  PLT 388 260 279   Basic Metabolic Panel: Recent Labs  Lab 04/17/21 1740 04/18/21 0441 04/19/21 0457  NA 141 138 137  K 4.5 4.1 3.7  CL 107 108 104  CO2 22 24 25   GLUCOSE 137* 126* 111*  BUN 28* 30* 30*  CREATININE 0.84 0.68 0.61  CALCIUM 9.4 8.7* 8.7*  MG  --   --  2.8*  PHOS  --   --  4.7*   GFR: Estimated Creatinine Clearance: 56.4 mL/min (by C-G formula based on SCr of 0.61 mg/dL). Liver Function Tests: No results for input(s): AST, ALT, ALKPHOS, BILITOT, PROT, ALBUMIN in the last 168 hours. No results for input(s): LIPASE, AMYLASE in the last 168 hours. No results for input(s): AMMONIA in the last 168 hours. Coagulation Profile: No results for input(s): INR, PROTIME in the last 168 hours. Cardiac Enzymes: No results for input(s): CKTOTAL, CKMB, CKMBINDEX, TROPONINI in the last 168 hours. BNP (last 3 results) No results for input(s): PROBNP in the last 8760 hours. HbA1C: No results for input(s): HGBA1C in the last 72 hours. CBG: Recent Labs  Lab 04/18/21 1311  GLUCAP 127*   Lipid Profile: No results for input(s): CHOL, HDL, LDLCALC, TRIG, CHOLHDL, LDLDIRECT in the last 72 hours. Thyroid Function Tests: No results for input(s): TSH, T4TOTAL, FREET4, T3FREE, THYROIDAB in the last 72 hours. Anemia Panel: No results for input(s): VITAMINB12, FOLATE, FERRITIN, TIBC, IRON, RETICCTPCT in the last 72 hours. Sepsis Labs: No results for input(s): PROCALCITON, LATICACIDVEN in the last 168 hours.  Recent Results (from the past 240 hour(s))  Resp Panel by RT-PCR (Flu A&B, Covid) Nasopharyngeal Swab     Status: None   Collection Time: 04/18/21 10:45 AM   Specimen: Nasopharyngeal Swab; Nasopharyngeal(NP) swabs in vial transport medium  Result Value Ref Range Status   SARS Coronavirus 2 by RT PCR NEGATIVE NEGATIVE Final    Comment: (NOTE) SARS-CoV-2 target nucleic acids are NOT DETECTED.  The SARS-CoV-2 RNA is generally detectable in upper  respiratory specimens during the acute phase of infection. The lowest concentration of SARS-CoV-2 viral copies this assay can detect is 138 copies/mL. A negative result does not preclude SARS-Cov-2 infection and should not be used as the sole basis for treatment or other patient management decisions. A negative result may occur with  improper specimen collection/handling, submission of specimen other than nasopharyngeal swab, presence of viral mutation(s) within the areas targeted by this assay, and inadequate number of viral copies(<138 copies/mL). A negative result must be combined with clinical observations, patient history, and epidemiological information. The expected result is Negative.  Fact Sheet for Patients:  06/19/21  Fact Sheet for Healthcare Providers:  BloggerCourse.com  This test is no t yet approved or cleared by the [employer] FDA and  has been  authorized for detection and/or diagnosis of SARS-CoV-2 by FDA under an Emergency Use Authorization (EUA). This EUA will remain  in effect (meaning this test can be used) for the duration of the COVID-19 declaration under Section 564(b)(1) of the Act, 21 U.S.C.section 360bbb-3(b)(1), unless the authorization is terminated  or revoked sooner.       Influenza A by PCR NEGATIVE NEGATIVE Final   Influenza B by PCR NEGATIVE NEGATIVE Final    Comment: (NOTE) The Xpert Xpress SARS-CoV-2/FLU/RSV plus assay is intended as an aid in the diagnosis of influenza from Nasopharyngeal swab specimens and should not be used as a sole basis for treatment. Nasal washings and aspirates are unacceptable for Xpert Xpress SARS-CoV-2/FLU/RSV testing.  Fact Sheet for Patients: BloggerCourse.com  Fact Sheet for Healthcare Providers: SeriousBroker.it  This test is not yet approved or cleared by the [place] FDA and has been  authorized for detection and/or diagnosis of SARS-CoV-2 by FDA under an Emergency Use Authorization (EUA). This EUA will remain in effect (meaning this test can be used) for the duration of the COVID-19 declaration under Section 564(b)(1) of the Act, 21 U.S.C. section 360bbb-3(b)(1), unless the authorization is terminated or revoked.  Performed at [facility], [address] [place], [place] [address]   MRSA Next Gen by PCR, Nasal     Status: None   Collection Time: 04/18/21  3:15 PM   Specimen: Nasal Mucosa; Nasal Swab  Result Value Ref Range Status   MRSA by PCR Next Gen NOT DETECTED NOT DETECTED Final    Comment: (NOTE) The GeneXpert MRSA Assay (FDA approved for NASAL specimens only), is one component of a comprehensive MRSA colonization surveillance program. It is not intended to diagnose MRSA infection nor to guide or monitor treatment for MRSA infections. Test performance is not FDA approved in patients less than 69 years old. Performed at [facility], [address] [place], [place] [address]       Radiology Studies: DG Chest Portable 1 View  Result Date: 04/17/2021 CLINICAL DATA:  Shortness of breath. EXAM: PORTABLE CHEST 1 VIEW COMPARISON:  01/03/2021 FINDINGS: The lungs are clear without focal pneumonia, edema, pneumothorax or pleural effusion. Interstitial markings are diffusely coarsened with chronic features. Cardiopericardial silhouette is at upper limits of normal for size. The visualized bony structures of the thorax show no acute abnormality. Cervicothoracic spinal stimulator leads again noted. Telemetry leads overlie the chest. IMPRESSION: Chronic interstitial coarsening.  No active disease. Electronically Signed   By: Kennith Center M.D.   On: 04/17/2021 18:32     Scheduled Meds:  budesonide (PULMICORT) nebulizer solution  0.5 mg Nebulization BID   Chlorhexidine Gluconate Cloth  6 each Topical Daily   enoxaparin (LOVENOX) injection  40 mg  Subcutaneous Q24H   furosemide  20 mg Oral BID   guaiFENesin-dextromethorphan  10 mL Oral Q6H   haloperidol lactate  2 mg Intravenous Once   ipratropium-albuterol  3 mL Nebulization Q4H   pantoprazole  40 mg Oral Daily   predniSONE  40 mg Oral Q breakfast   Continuous Infusions:  dexmedetomidine (PRECEDEX) IV infusion Stopped (04/19/21 0629)     LOS: 2 days     Darlin Priestly, MD [facility] If 7PM-7AM, please contact night-coverage 04/19/2021, 5:26 PM

## 2021-04-20 ENCOUNTER — Other Ambulatory Visit: Payer: Self-pay

## 2021-04-20 LAB — MAGNESIUM: Magnesium: 2.7 mg/dL — ABNORMAL HIGH (ref 1.7–2.4)

## 2021-04-20 LAB — PHOSPHORUS: Phosphorus: 3.4 mg/dL (ref 2.5–4.6)

## 2021-04-20 LAB — CBC
HCT: 31.2 % — ABNORMAL LOW (ref 36.0–46.0)
Hemoglobin: 9.7 g/dL — ABNORMAL LOW (ref 12.0–15.0)
MCH: 26.6 pg (ref 26.0–34.0)
MCHC: 31.1 g/dL (ref 30.0–36.0)
MCV: 85.7 fL (ref 80.0–100.0)
Platelets: 275 10*3/uL (ref 150–400)
RBC: 3.64 MIL/uL — ABNORMAL LOW (ref 3.87–5.11)
RDW: 14.3 % (ref 11.5–15.5)
WBC: 5.6 10*3/uL (ref 4.0–10.5)
nRBC: 0 % (ref 0.0–0.2)

## 2021-04-20 LAB — BASIC METABOLIC PANEL
Anion gap: 6 (ref 5–15)
BUN: 30 mg/dL — ABNORMAL HIGH (ref 6–20)
CO2: 27 mmol/L (ref 22–32)
Calcium: 8.2 mg/dL — ABNORMAL LOW (ref 8.9–10.3)
Chloride: 106 mmol/L (ref 98–111)
Creatinine, Ser: 0.73 mg/dL (ref 0.44–1.00)
GFR, Estimated: >60 mL/min (ref >60)
Glucose, Bld: 98 mg/dL (ref 70–99)
Potassium: 3.2 mmol/L — ABNORMAL LOW (ref 3.5–5.1)
Sodium: 139 mmol/L (ref 135–145)

## 2021-04-20 MED ORDER — POTASSIUM CHLORIDE CRYS ER 20 MEQ PO TBCR
40.0000 meq | EXTENDED_RELEASE_TABLET | Freq: Once | ORAL | Status: AC
  Administered 2021-04-20: 40 meq via ORAL
  Filled 2021-04-20: qty 2

## 2021-04-20 MED ORDER — MECLIZINE HCL 25 MG PO TABS
50.0000 mg | ORAL_TABLET | Freq: Every evening | ORAL | Status: DC | PRN
  Administered 2021-04-20 – 2021-04-22 (×2): 50 mg via ORAL
  Filled 2021-04-20 (×3): qty 2

## 2021-04-20 NOTE — Progress Notes (Signed)
PROGRESS NOTE    Elizabeth [name]  LXB:[id number] DOB: 1961-03-01 DOA: 04/17/2021 PCP: Lorn Junes, FNP  [id number]/[id number]   Assessment & Plan:   Principal Problem:   COPD with acute exacerbation (HCC) Active Problems:   Generalized anxiety disorder   Chronic pain syndrome   Congestive heart failure (HCC)   HTN (hypertension)   History of tobacco abuse   Elizabeth [name] is a 60 y.o. female with medical history significant for COPD, last hospitalized 3 months prior, anxiety, CHF with last echo 2019 showing EF 45% and grade 1 diastolic dysfunction, chronic pain syndrome on chronic opiates, HTN and tobacco use disorder who was brought in by EMS in respiratory distress with tachypnea in the 40s to 50s requiring CPAP.  She reported chest tightness with breathing but denied nausea, vomiting or diaphoresis.  Denied cough, fever or chills, abdominal pain or diarrhea. ED course: On arrival BP 172/130 with pulse 112 respirations 40 and afebrile and O2 sat 99% on CPAP.  She was transitioned to BiPAP on arrival.  Blood work significant for venous blood gas with pH 7.40, PCO2 37 and PO2 69, troponin of 4 and BNP 67.2.     Severe respiratory distress COPD with acute exacerbation  -Patient very tachypneic but with reassuring labs, VBG pH 7.40, PCO2 37 and PO2 69, troponin of 4 and BNP 67  --rapid RR appeared to be due in part to severe anxiety --No signs of PNA or fluid overload --started on IV solumedrol  --transferred to stepdown on 7/8 due to severely elevated RR and distress Plan: --cont BiPAP PRN --cont prednisone 40 mg daily --DuoNeb q4h --Pulmicort neb BID --IV Morphine PRN for air hunger     Generalized anxiety disorder - anxiety was not improved with ativan --transferred to stepdown for precedex gtt --off precedex this morning. --atarax PRN    Chronic pain syndrome on chronic opioids - IV morphine PRN ordered instead of home opioids     Congestive heart failure,  chronic, combined - Last echo 2019 with EF 45% and grade 1 diastolic dysfunction - BNP normal at 67 and chest x-ray with chronic findings only and with normal troponin - CHF exacerbation not suspected Plan: --hold home lasix 2/2 low BP --cont to hold home coreg and aldactone due to soft BP     HTN (hypertension) --hold home lasix 2/2 low BP --cont to hold home coreg and aldactone due to soft BP   DVT prophylaxis: Lovenox SQ Code Status: Full code  Family Communication:  Level of care: stepdown Dispo:   The patient is from: home Anticipated d/c is to: home Anticipated d/c date is: 1-2 days Patient currently is not medically ready to d/c due to: respiratory distress   Subjective and Interval History:  Precedex gtt was turned off mid-morning.  Pt said her breathing is better.  Ate more food brought by her son.  Transfer out of stepdown to MedSurg today.   Objective: Vitals:   04/20/21 0900 04/20/21 1000 04/20/21 1100 04/20/21 1200  BP: (!) 93/50 (!) 87/56 (!) 98/48 102/70  Pulse: (!) 59 60 66 83  Resp: 15 15 20  (!) 22  Temp:      TempSrc:      SpO2: 99% 98% 96% 91%  Weight:      Height:        Intake/Output Summary (Last 24 hours) at 04/20/2021 1428 Last data filed at 04/20/2021 1100 Gross per 24 hour  Intake 22.75 ml  Output 550 ml  Net -527.25 ml   Filed Weights   04/17/21 1743 04/18/21 0002 04/18/21 1315  Weight: 45 kg 62.9 kg 61.5 kg    Examination:   Constitutional: NAD, AAOx3 HEENT: conjunctivae and lids normal, EOMI CV: No cyanosis.   RESP: normal respiratory effort, on 2L Extremities: No effusions, edema in BLE SKIN: warm, dry Neuro: II - XII grossly intact.   Psych: flat mood and affect.      Data Reviewed: I have personally reviewed following labs and imaging studies  CBC: Recent Labs  Lab 04/17/21 1740 04/18/21 0441 04/19/21 0457 04/20/21 0546  WBC 11.5* 5.7 5.1 5.6  NEUTROABS 9.2*  --   --   --   HGB 10.8* 9.2* 9.6* 9.7*  HCT 35.5*  29.5* 30.1* 31.2*  MCV 87.4 84.5 87.0 85.7  PLT 388 260 279 275   Basic Metabolic Panel: Recent Labs  Lab 04/17/21 1740 04/18/21 0441 04/19/21 0457 04/20/21 0546  NA 141 138 137 139  K 4.5 4.1 3.7 3.2*  CL 107 108 104 106  CO2 22 24 25 27   GLUCOSE 137* 126* 111* 98  BUN 28* 30* 30* 30*  CREATININE 0.84 0.68 0.61 0.73  CALCIUM 9.4 8.7* 8.7* 8.2*  MG  --   --  2.8* 2.7*  PHOS  --   --  4.7* 3.4   GFR: Estimated Creatinine Clearance: 56.4 mL/min (by C-G formula based on SCr of 0.73 mg/dL). Liver Function Tests: No results for input(s): AST, ALT, ALKPHOS, BILITOT, PROT, ALBUMIN in the last 168 hours. No results for input(s): LIPASE, AMYLASE in the last 168 hours. No results for input(s): AMMONIA in the last 168 hours. Coagulation Profile: No results for input(s): INR, PROTIME in the last 168 hours. Cardiac Enzymes: No results for input(s): CKTOTAL, CKMB, CKMBINDEX, TROPONINI in the last 168 hours. BNP (last 3 results) No results for input(s): PROBNP in the last 8760 hours. HbA1C: No results for input(s): HGBA1C in the last 72 hours. CBG: Recent Labs  Lab 04/18/21 1311  GLUCAP 127*   Lipid Profile: No results for input(s): CHOL, HDL, LDLCALC, TRIG, CHOLHDL, LDLDIRECT in the last 72 hours. Thyroid Function Tests: No results for input(s): TSH, T4TOTAL, FREET4, T3FREE, THYROIDAB in the last 72 hours. Anemia Panel: No results for input(s): VITAMINB12, FOLATE, FERRITIN, TIBC, IRON, RETICCTPCT in the last 72 hours. Sepsis Labs: No results for input(s): PROCALCITON, LATICACIDVEN in the last 168 hours.  Recent Results (from the past 240 hour(s))  Resp Panel by RT-PCR (Flu A&B, Covid) Nasopharyngeal Swab     Status: None   Collection Time: 04/18/21 10:45 AM   Specimen: Nasopharyngeal Swab; Nasopharyngeal(NP) swabs in vial transport medium  Result Value Ref Range Status   SARS Coronavirus 2 by RT PCR NEGATIVE NEGATIVE Final    Comment: (NOTE) SARS-CoV-2 target nucleic acids  are NOT DETECTED.  The SARS-CoV-2 RNA is generally detectable in upper respiratory specimens during the acute phase of infection. The lowest concentration of SARS-CoV-2 viral copies this assay can detect is 138 copies/mL. A negative result does not preclude SARS-Cov-2 infection and should not be used as the sole basis for treatment or other patient management decisions. A negative result may occur with  improper specimen collection/handling, submission of specimen other than nasopharyngeal swab, presence of viral mutation(s) within the areas targeted by this assay, and inadequate number of viral copies(<138 copies/mL). A negative result must be combined with clinical observations, patient history, and epidemiological information. The expected result is Negative.  Fact Sheet for Patients:  BloggerCourse.com  Fact Sheet for Healthcare Providers:  SeriousBroker.it  This test is no t yet approved or cleared by the [place] FDA and  has been authorized for detection and/or diagnosis of SARS-CoV-2 by FDA under an Emergency Use Authorization (EUA). This EUA will remain  in effect (meaning this test can be used) for the duration of the COVID-19 declaration under Section 564(b)(1) of the Act, 21 U.S.C.section 360bbb-3(b)(1), unless the authorization is terminated  or revoked sooner.       Influenza A by PCR NEGATIVE NEGATIVE Final   Influenza B by PCR NEGATIVE NEGATIVE Final    Comment: (NOTE) The Xpert Xpress SARS-CoV-2/FLU/RSV plus assay is intended as an aid in the diagnosis of influenza from Nasopharyngeal swab specimens and should not be used as a sole basis for treatment. Nasal washings and aspirates are unacceptable for Xpert Xpress SARS-CoV-2/FLU/RSV testing.  Fact Sheet for Patients: BloggerCourse.com  Fact Sheet for Healthcare Providers: SeriousBroker.it  This test is  not yet approved or cleared by the [place] FDA and has been authorized for detection and/or diagnosis of SARS-CoV-2 by FDA under an Emergency Use Authorization (EUA). This EUA will remain in effect (meaning this test can be used) for the duration of the COVID-19 declaration under Section 564(b)(1) of the Act, 21 U.S.C. section 360bbb-3(b)(1), unless the authorization is terminated or revoked.  Performed at [facility], [address] [place], [place] [address]   MRSA Next Gen by PCR, Nasal     Status: None   Collection Time: 04/18/21  3:15 PM   Specimen: Nasal Mucosa; Nasal Swab  Result Value Ref Range Status   MRSA by PCR Next Gen NOT DETECTED NOT DETECTED Final    Comment: (NOTE) The GeneXpert MRSA Assay (FDA approved for NASAL specimens only), is one component of a comprehensive MRSA colonization surveillance program. It is not intended to diagnose MRSA infection nor to guide or monitor treatment for MRSA infections. Test performance is not FDA approved in patients less than 47 years old. Performed at [facility], [address] [place], [place] [address]       Radiology Studies: No results found.   Scheduled Meds:  budesonide (PULMICORT) nebulizer solution  0.5 mg Nebulization BID   Chlorhexidine Gluconate Cloth  6 each Topical Daily   enoxaparin (LOVENOX) injection  40 mg Subcutaneous Q24H   guaiFENesin-dextromethorphan  10 mL Oral Q6H   haloperidol lactate  2 mg Intravenous Once   ipratropium-albuterol  3 mL Nebulization Q4H   pantoprazole  40 mg Oral Daily   predniSONE  40 mg Oral Q breakfast   Continuous Infusions:  dexmedetomidine (PRECEDEX) IV infusion Stopped (04/20/21 0821)     LOS: 3 days     Darlin Priestly, MD [facility] If 7PM-7AM, please contact night-coverage 04/20/2021, 2:28 PM

## 2021-04-21 LAB — BASIC METABOLIC PANEL
Anion gap: 7 (ref 5–15)
BUN: 23 mg/dL — ABNORMAL HIGH (ref 6–20)
CO2: 29 mmol/L (ref 22–32)
Calcium: 8.5 mg/dL — ABNORMAL LOW (ref 8.9–10.3)
Chloride: 106 mmol/L (ref 98–111)
Creatinine, Ser: 0.64 mg/dL (ref 0.44–1.00)
GFR, Estimated: >60 mL/min (ref >60)
Glucose, Bld: 109 mg/dL — ABNORMAL HIGH (ref 70–99)
Potassium: 3.8 mmol/L (ref 3.5–5.1)
Sodium: 142 mmol/L (ref 135–145)

## 2021-04-21 LAB — CBC
HCT: 31.9 % — ABNORMAL LOW (ref 36.0–46.0)
Hemoglobin: 10 g/dL — ABNORMAL LOW (ref 12.0–15.0)
MCH: 27.2 pg (ref 26.0–34.0)
MCHC: 31.3 g/dL (ref 30.0–36.0)
MCV: 86.7 fL (ref 80.0–100.0)
Platelets: 285 10*3/uL (ref 150–400)
RBC: 3.68 MIL/uL — ABNORMAL LOW (ref 3.87–5.11)
RDW: 14.3 % (ref 11.5–15.5)
WBC: 7.9 10*3/uL (ref 4.0–10.5)
nRBC: 0 % (ref 0.0–0.2)

## 2021-04-21 LAB — MAGNESIUM: Magnesium: 2.8 mg/dL — ABNORMAL HIGH (ref 1.7–2.4)

## 2021-04-21 MED ORDER — MORPHINE SULFATE (PF) 2 MG/ML IV SOLN: 2.0000 mg | INTRAVENOUS | Status: DC | PRN

## 2021-04-21 MED ORDER — IPRATROPIUM-ALBUTEROL 0.5-2.5 (3) MG/3ML IN SOLN
3.0000 mL | RESPIRATORY_TRACT | Status: DC
  Administered 2021-04-21 – 2021-04-22 (×2): 3 mL via RESPIRATORY_TRACT
  Filled 2021-04-21 (×3): qty 3

## 2021-04-21 MED ORDER — MORPHINE SULFATE (PF) 2 MG/ML IV SOLN
1.0000 mg | INTRAVENOUS | Status: DC | PRN
  Administered 2021-04-21 – 2021-04-22 (×3): 1 mg via INTRAVENOUS
  Filled 2021-04-21 (×4): qty 1

## 2021-04-21 MED ORDER — HYDROCODONE-ACETAMINOPHEN 5-325 MG PO TABS
1.0000 | ORAL_TABLET | Freq: Four times a day (QID) | ORAL | Status: DC | PRN
  Administered 2021-04-21 – 2021-04-22 (×2): 1 via ORAL
  Filled 2021-04-21 (×2): qty 1

## 2021-04-21 NOTE — Plan of Care (Signed)
End of Shift Summary:  Alert and oriented x4. VSS on 2L via [place]. Pain and anxiety managed with prn medications. Denies n/v. Remained free from falls and injury. Call bell within reach and able to use.   Problem: Clinical Measurements: Goal: Ability to maintain clinical measurements within normal limits will improve Outcome: Progressing Goal: Will remain free from infection Outcome: Progressing Goal: Diagnostic test results will improve Outcome: Progressing Goal: Respiratory complications will improve Outcome: Progressing Goal: Cardiovascular complication will be avoided Outcome: Progressing   Problem: Elimination: Goal: Will not experience complications related to bowel motility Outcome: Progressing Goal: Will not experience complications related to urinary retention Outcome: Progressing   Problem: Pain Managment: Goal: General experience of comfort will improve Outcome: Progressing   Problem: Education: Goal: Knowledge of disease or condition will improve Outcome: Progressing Goal: Knowledge of the prescribed therapeutic regimen will improve Outcome: Progressing   Problem: Respiratory: Goal: Ability to maintain a clear airway will improve Outcome: Progressing Goal: Levels of oxygenation will improve Outcome: Progressing Goal: Ability to maintain adequate ventilation will improve Outcome: Progressing

## 2021-04-21 NOTE — Progress Notes (Signed)
PROGRESS NOTE    Elizabeth [name]  LGX:[id number] DOB: 1961-07-30 DOA: 04/17/2021 PCP: Lorn Junes, FNP  113A/113A-AA   Assessment & Plan:   Principal Problem:   COPD with acute exacerbation (HCC) Active Problems:   Generalized anxiety disorder   Chronic pain syndrome   Congestive heart failure (HCC)   HTN (hypertension)   History of tobacco abuse   Elizabeth [name] is a 60 y.o. female with medical history significant for COPD, last hospitalized 3 months prior, anxiety, CHF with last echo 2019 showing EF 45% and grade 1 diastolic dysfunction, chronic pain syndrome on chronic opiates, HTN and tobacco use disorder who was brought in by EMS in respiratory distress with tachypnea in the 40s to 50s requiring CPAP.  She reported chest tightness with breathing but denied nausea, vomiting or diaphoresis.  Denied cough, fever or chills, abdominal pain or diarrhea. ED course: On arrival BP 172/130 with pulse 112 respirations 40 and afebrile and O2 sat 99% on CPAP.  She was transitioned to BiPAP on arrival.  Blood work significant for venous blood gas with pH 7.40, PCO2 37 and PO2 69, troponin of 4 and BNP 67.2.     Severe respiratory distress COPD with acute exacerbation  -Patient very tachypneic but with reassuring labs, VBG pH 7.40, PCO2 37 and PO2 69, troponin of 4 and BNP 67  --rapid RR appeared to be due in part to severe anxiety --No signs of PNA or fluid overload --started on IV solumedrol  --transferred to stepdown on 7/8 due to severely elevated RR and distress for precedex gtt.  Off and back on the floor on 7/10. Plan: --cont BiPAP PRN --DuoNeb q4h while awake --Pulmicort neb BID --Robitussin  --IV Morphine PRN for air hunger     Generalized anxiety disorder - anxiety was not improved with ativan --transferred to stepdown for precedex gtt --cont atarax PRN    Chronic pain syndrome on chronic opioids - resume home Norco today --IV morphine PRN for air hunger      Congestive heart failure, chronic, combined - Last echo 2019 with EF 45% and grade 1 diastolic dysfunction - BNP normal at 67 and chest x-ray with chronic findings only and with normal troponin - CHF exacerbation not suspected Plan: --hold home lasix, coreg and aldactone 2/2 low BP     HTN (hypertension) --hold home lasix, coreg and aldactone 2/2 low BP   DVT prophylaxis: Lovenox SQ Code Status: Full code  Family Communication:  Level of care: stepdown Dispo:   The patient is from: home Anticipated d/c is to: home Anticipated d/c date is: 1-2 days Patient currently is not medically ready to d/c due to: respiratory distress   Subjective and Interval History:  Pt has been doing ok off of precedex gtt and on the floor.  Still coughing but cough med helped.  Neb tx also helped.     Objective: Vitals:   04/21/21 0725 04/21/21 1114 04/21/21 1500 04/21/21 1530  BP: 105/60   (!) 104/59  Pulse: 62   68  Resp: 20   20  Temp: 98.7 F (37.1 C)   98.1 F (36.7 C)  TempSrc:      SpO2: 100% 97% 96% 97%  Weight:      Height:        Intake/Output Summary (Last 24 hours) at 04/21/2021 1637 Last data filed at 04/21/2021 1422 Gross per 24 hour  Intake 360 ml  Output 500 ml  Net -140 ml   Filed  Weights   04/17/21 1743 04/18/21 0002 04/18/21 1315  Weight: 45 kg 62.9 kg 61.5 kg    Examination:  Constitutional: NAD, AAOx3 HEENT: conjunctivae and lids normal, EOMI CV: No cyanosis.   RESP: Diffuse loud wheezes, on 2L Extremities: No effusions, edema in BLE SKIN: warm, dry Neuro: II - XII grossly intact.   Psych: depressed mood and affect.     Data Reviewed: I have personally reviewed following labs and imaging studies  CBC: Recent Labs  Lab 04/17/21 1740 04/18/21 0441 04/19/21 0457 04/20/21 0546 04/21/21 0454  WBC 11.5* 5.7 5.1 5.6 7.9  NEUTROABS 9.2*  --   --   --   --   HGB 10.8* 9.2* 9.6* 9.7* 10.0*  HCT 35.5* 29.5* 30.1* 31.2* 31.9*  MCV 87.4 84.5 87.0 85.7  86.7  PLT 388 260 279 275 285   Basic Metabolic Panel: Recent Labs  Lab 04/17/21 1740 04/18/21 0441 04/19/21 0457 04/20/21 0546 04/21/21 0454  NA 141 138 137 139 142  K 4.5 4.1 3.7 3.2* 3.8  CL 107 108 104 106 106  CO2 22 24 25 27 29   GLUCOSE 137* 126* 111* 98 109*  BUN 28* 30* 30* 30* 23*  CREATININE 0.84 0.68 0.61 0.73 0.64  CALCIUM 9.4 8.7* 8.7* 8.2* 8.5*  MG  --   --  2.8* 2.7* 2.8*  PHOS  --   --  4.7* 3.4  --    GFR: Estimated Creatinine Clearance: 56.4 mL/min (by C-G formula based on SCr of 0.64 mg/dL). Liver Function Tests: No results for input(s): AST, ALT, ALKPHOS, BILITOT, PROT, ALBUMIN in the last 168 hours. No results for input(s): LIPASE, AMYLASE in the last 168 hours. No results for input(s): AMMONIA in the last 168 hours. Coagulation Profile: No results for input(s): INR, PROTIME in the last 168 hours. Cardiac Enzymes: No results for input(s): CKTOTAL, CKMB, CKMBINDEX, TROPONINI in the last 168 hours. BNP (last 3 results) No results for input(s): PROBNP in the last 8760 hours. HbA1C: No results for input(s): HGBA1C in the last 72 hours. CBG: Recent Labs  Lab 04/18/21 1311  GLUCAP 127*   Lipid Profile: No results for input(s): CHOL, HDL, LDLCALC, TRIG, CHOLHDL, LDLDIRECT in the last 72 hours. Thyroid Function Tests: No results for input(s): TSH, T4TOTAL, FREET4, T3FREE, THYROIDAB in the last 72 hours. Anemia Panel: No results for input(s): VITAMINB12, FOLATE, FERRITIN, TIBC, IRON, RETICCTPCT in the last 72 hours. Sepsis Labs: No results for input(s): PROCALCITON, LATICACIDVEN in the last 168 hours.  Recent Results (from the past 240 hour(s))  Resp Panel by RT-PCR (Flu A&B, Covid) Nasopharyngeal Swab     Status: None   Collection Time: 04/18/21 10:45 AM   Specimen: Nasopharyngeal Swab; Nasopharyngeal(NP) swabs in vial transport medium  Result Value Ref Range Status   SARS Coronavirus 2 by RT PCR NEGATIVE NEGATIVE Final    Comment:  (NOTE) SARS-CoV-2 target nucleic acids are NOT DETECTED.  The SARS-CoV-2 RNA is generally detectable in upper respiratory specimens during the acute phase of infection. The lowest concentration of SARS-CoV-2 viral copies this assay can detect is 138 copies/mL. A negative result does not preclude SARS-Cov-2 infection and should not be used as the sole basis for treatment or other patient management decisions. A negative result may occur with  improper specimen collection/handling, submission of specimen other than nasopharyngeal swab, presence of viral mutation(s) within the areas targeted by this assay, and inadequate number of viral copies(<138 copies/mL). A negative result must be combined with clinical  observations, patient history, and epidemiological information. The expected result is Negative.  Fact Sheet for Patients:  BloggerCourse.com  Fact Sheet for Healthcare Providers:  SeriousBroker.it  This test is no t yet approved or cleared by the [place] FDA and  has been authorized for detection and/or diagnosis of SARS-CoV-2 by FDA under an Emergency Use Authorization (EUA). This EUA will remain  in effect (meaning this test can be used) for the duration of the COVID-19 declaration under Section 564(b)(1) of the Act, 21 U.S.C.section 360bbb-3(b)(1), unless the authorization is terminated  or revoked sooner.       Influenza A by PCR NEGATIVE NEGATIVE Final   Influenza B by PCR NEGATIVE NEGATIVE Final    Comment: (NOTE) The Xpert Xpress SARS-CoV-2/FLU/RSV plus assay is intended as an aid in the diagnosis of influenza from Nasopharyngeal swab specimens and should not be used as a sole basis for treatment. Nasal washings and aspirates are unacceptable for Xpert Xpress SARS-CoV-2/FLU/RSV testing.  Fact Sheet for Patients: BloggerCourse.com  Fact Sheet for Healthcare  Providers: SeriousBroker.it  This test is not yet approved or cleared by the [place] FDA and has been authorized for detection and/or diagnosis of SARS-CoV-2 by FDA under an Emergency Use Authorization (EUA). This EUA will remain in effect (meaning this test can be used) for the duration of the COVID-19 declaration under Section 564(b)(1) of the Act, 21 U.S.C. section 360bbb-3(b)(1), unless the authorization is terminated or revoked.  Performed at [facility], [address] [place], [place] [address]   MRSA Next Gen by PCR, Nasal     Status: None   Collection Time: 04/18/21  3:15 PM   Specimen: Nasal Mucosa; Nasal Swab  Result Value Ref Range Status   MRSA by PCR Next Gen NOT DETECTED NOT DETECTED Final    Comment: (NOTE) The GeneXpert MRSA Assay (FDA approved for NASAL specimens only), is one component of a comprehensive MRSA colonization surveillance program. It is not intended to diagnose MRSA infection nor to guide or monitor treatment for MRSA infections. Test performance is not FDA approved in patients less than 27 years old. Performed at [facility], [address], [place], [place] [address]       Radiology Studies: No results found.   Scheduled Meds:  budesonide (PULMICORT) nebulizer solution  0.5 mg Nebulization BID   enoxaparin (LOVENOX) injection  40 mg Subcutaneous Q24H   guaiFENesin-dextromethorphan  10 mL Oral Q6H   haloperidol lactate  2 mg Intravenous Once   ipratropium-albuterol  3 mL Nebulization Q4H   pantoprazole  40 mg Oral Daily   predniSONE  40 mg Oral Q breakfast   Continuous Infusions:     LOS: 4 days     Darlin Priestly, MD [facility] If 7PM-7AM, please contact night-coverage 04/21/2021, 4:37 PM

## 2021-04-22 LAB — BASIC METABOLIC PANEL
Anion gap: 6 (ref 5–15)
BUN: 20 mg/dL (ref 6–20)
CO2: 30 mmol/L (ref 22–32)
Calcium: 8.6 mg/dL — ABNORMAL LOW (ref 8.9–10.3)
Chloride: 104 mmol/L (ref 98–111)
Creatinine, Ser: 0.62 mg/dL (ref 0.44–1.00)
GFR, Estimated: >60 mL/min (ref >60)
Glucose, Bld: 102 mg/dL — ABNORMAL HIGH (ref 70–99)
Potassium: 3.8 mmol/L (ref 3.5–5.1)
Sodium: 140 mmol/L (ref 135–145)

## 2021-04-22 LAB — CBC
HCT: 32.5 % — ABNORMAL LOW (ref 36.0–46.0)
Hemoglobin: 10.1 g/dL — ABNORMAL LOW (ref 12.0–15.0)
MCH: 26.8 pg (ref 26.0–34.0)
MCHC: 31.1 g/dL (ref 30.0–36.0)
MCV: 86.2 fL (ref 80.0–100.0)
Platelets: 285 10*3/uL (ref 150–400)
RBC: 3.77 MIL/uL — ABNORMAL LOW (ref 3.87–5.11)
RDW: 14.3 % (ref 11.5–15.5)
WBC: 9 10*3/uL (ref 4.0–10.5)
nRBC: 0 % (ref 0.0–0.2)

## 2021-04-22 LAB — MAGNESIUM: Magnesium: 2.5 mg/dL — ABNORMAL HIGH (ref 1.7–2.4)

## 2021-04-22 MED ORDER — GUAIFENESIN-DM 100-10 MG/5ML PO SYRP: 10.0000 mL | ORAL_SOLUTION | Freq: Four times a day (QID) | ORAL | 0 refills | Status: DC | PRN

## 2021-04-22 MED ORDER — AZITHROMYCIN 500 MG PO TABS
250.0000 mg | ORAL_TABLET | Freq: Every day | ORAL | Status: DC
  Administered 2021-04-22: 250 mg via ORAL
  Filled 2021-04-22: qty 1

## 2021-04-22 MED ORDER — PREDNISONE 10 MG PO TABS: ORAL_TABLET | ORAL | 0 refills | Status: DC

## 2021-04-22 MED ORDER — HYDROXYZINE HCL 50 MG PO TABS: 50.0000 mg | ORAL_TABLET | Freq: Two times a day (BID) | ORAL | 2 refills | Status: DC | PRN

## 2021-04-22 MED ORDER — AZITHROMYCIN 250 MG PO TABS: 250.0000 mg | ORAL_TABLET | Freq: Every day | ORAL | 0 refills | Status: AC

## 2021-04-22 NOTE — Progress Notes (Signed)
NAME:  Elizabeth RiffleRita Lannette [name], MRN:  [id number], DOB:  August 10, 1961, LOS: 5 ADMISSION DATE:  04/17/2021, CONSULTATION DATE:  04/18/21 REFERRING MD: Dr. Fran LowesLai, CHIEF COMPLAINT: Acute respiratory distress  Brief Pt Description / Synopsis:  60 year old female admitted with acute respiratory distress due to severe COPD exacerbation requiring BiPAP.  High risk for intubation.  History of Present Illness:  Elizabeth FellersRita [name] is a 60 year old female with a past medical history as listed below who [facility] ED on 04/17/2021 due to shortness of breath and respiratory distress.  Pt currently with respiratory distress on BiPAP, therefore history is obtained from ED and nursing notes.  Per notes, she presented to her outpatient clinic on 04/17/21 due to progressive dyspnea over the past week.  She was noted to be in respiratory distress with respiratory rate approximately 40, with wheezing and rales in all fields.  EMS was dispatched, and administered DuoNeb x1 along with 125 mg Solu-Medrol and 2g of Magnesium, and was placed on CPAP.  Upon arrival to the ED she was transitioned to BiPAP from CPAP. She reported shortness of breath, dry cough, wheezing, chest tightness., and edema to lower extremities. She denied fever, chills, abdominal pain, nausea, vomiting, diarrhea.  ED course: Vital signs: Afebrile, pulse 112, respirations 40, blood pressure 172/130, O2 saturations 99% on CPAP. Venous blood gas: pH 7.4/PCO2 37/PCO2 69/Bicarb 22.9 Labs: WBC 11.5, Hgb 10.8, BNP 67, HS Troponin 4, glucose 137 Imaging: Chest X-ray with chronic interstitial changes, no active disease. EKG: Sinus tachycardia with incomplete RBBB and nonspecific ST-T wave changes  She was given Duo-nebs and Solu-Medrol, along with IV Ativan for suspected anxiety.  Hospitalists were asked to admit.  Events:   04/17/21: Admitted to Med-Surg for COPD exacerbation 04/18/21: RR called due to respiratory distress, transferred to ICU for BiPAP; PCCM  consulted, High risk for intubation rapid response was called due to worsening respiratory distress with RR 50 and accessory muscle use. She was transferred to ICU. 04/19/21- patient improved, she has not had overnight events acutely.  Blood work is normal. PCCM is consulted for further assistance and management of acute respiratory distress due to severe COPD exacerbation requiring BiPAP.  High risk for intubation.  04/22/21- patient is cleared for dc home.  She is improved but still wheezing.  Please initiate prednisone 20mg  x5d and 10x5d with zitrhomax 250x7 more days and outpatient evaluation w/in 7d at [facility] pulmonary   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) Hypertension Chronic pain syndrome Anxiety Tobacco abuse  Micro Data:  04/18/2021: SARS-CoV-2 and influenza PCR>> negative  Antimicrobials:  N/A     Objective   Blood pressure 109/63, pulse 66, temperature 97.9 F (36.6 C), temperature source Oral, resp. rate 20, height 4\' 9"  (1.448 m), weight 61.5 kg, SpO2 96 %.        Intake/Output Summary (Last 24 hours) at 04/22/2021 0916 Last data filed at 04/22/2021 0630 Gross per 24 hour  Intake 840 ml  Output 750 ml  Net 90 ml    Filed Weights   04/17/21 1743 04/18/21 0002 04/18/21 1315  Weight: 45 kg 62.9 kg 61.5 kg    Examination: General: age appropriate in no distress HENT: Atraumatic, normocephalic, neck supple, no JVD Lungs:exp whezing with rhonchi bilaterally improved from previous  Cardiovascular: Regular rate and rhythm, S1-S2, no murmurs, rubs, gallops, 2+ distal pulses Abdomen: Soft, nontender, nondistended, no guarding rebound tenderness, bowel sounds positive x4 Extremities: Normal bulk and  tone, no deformities, no edema, good peripheral circulation Neuro: Awake, alert and oriented x4, moves all extremities to commands, no focal deficits, speech clear GU: Deferred Skin: Warm and dry.  No obvious rashes,  lesions, ulcerations  Resolved Hospital Problem list     Assessment & Plan:   Severe Acute COPD Exacerbation-improved  -Supplemental O2 as needed to maintain O2 sats 88 to 92% -cleared for d/c home with prednsone and zithromax  and outpatient evaluation w/in 7d  Chronic combined Systolic & Diastolic CHF without acute exacerbation Hypertension -Continuous cardiac monitoring -Maintain MAP >65 -HS Troponin negative x2 (3 ~ 4) -BNP 67 -Most recent ECHO in 2019 with LVEF 45% and grade 1 Diastolic Dysfunction -Diuresis as BP and renal function permits (currently on 20 mg Lasix BID) -Continue home Coreg   Generalized Anxiety Disorder Chronic Pain Syndrome -Provide supportive care -PRN Ativan/Xanax -Precedex if needed given severe anxiety and respiratory distress     Pt with respiratory distress, high risk for intubation.  Prognosis is guarded.  Best Practice (right click and "Reselect all SmartList Selections" daily)   Diet/type: NPO DVT prophylaxis: LMWH GI prophylaxis: PPI Lines: N/A Foley:  N/A Code Status:  full code Last date of multidisciplinary goals of care discussion [N/A]  Discussed with pt at bedside 04/18/21, she is in agreement with intubation if needed.  Labs   CBC: Recent Labs  Lab 04/17/21 1740 04/18/21 0441 04/19/21 0457 04/20/21 0546 04/21/21 0454 04/22/21 0546  WBC 11.5* 5.7 5.1 5.6 7.9 9.0  NEUTROABS 9.2*  --   --   --   --   --   HGB 10.8* 9.2* 9.6* 9.7* 10.0* 10.1*  HCT 35.5* 29.5* 30.1* 31.2* 31.9* 32.5*  MCV 87.4 84.5 87.0 85.7 86.7 86.2  PLT 388 260 279 275 285 285     Basic Metabolic Panel: Recent Labs  Lab 04/18/21 0441 04/19/21 0457 04/20/21 0546 04/21/21 0454 04/22/21 0546  NA 138 137 139 142 140  K 4.1 3.7 3.2* 3.8 3.8  CL 108 104 106 106 104  CO2 24 25 27 29 30   GLUCOSE 126* 111* 98 109* 102*  BUN 30* 30* 30* 23* 20  CREATININE 0.68 0.61 0.73 0.64 0.62  CALCIUM 8.7* 8.7* 8.2* 8.5* 8.6*  MG  --  2.8* 2.7* 2.8* 2.5*   PHOS  --  4.7* 3.4  --   --     GFR: Estimated Creatinine Clearance: 56.4 mL/min (by C-G formula based on SCr of 0.62 mg/dL). Recent Labs  Lab 04/19/21 0457 04/20/21 0546 04/21/21 0454 04/22/21 0546  WBC 5.1 5.6 7.9 9.0     Liver Function Tests: No results for input(s): AST, ALT, ALKPHOS, BILITOT, PROT, ALBUMIN in the last 168 hours. No results for input(s): LIPASE, AMYLASE in the last 168 hours. No results for input(s): AMMONIA in the last 168 hours.  ABG    Component Value Date/Time   HCO3 25.6 04/18/2021 1656   ACIDBASEDEF 1.6 04/17/2021 1749   O2SAT 97.0 04/18/2021 1656      Coagulation Profile: No results for input(s): INR, PROTIME in the last 168 hours.  Cardiac Enzymes: No results for input(s): CKTOTAL, CKMB, CKMBINDEX, TROPONINI in the last 168 hours.  HbA1C: Hgb A1c MFr Bld  Date/Time Value Ref Range Status  01/04/2021 06:29 AM 5.9 (H) 4.8 - 5.6 % Final    Comment:    (NOTE) Pre diabetes:          5.7%-6.4%  Diabetes:              >  6.4%  Glycemic control for   <7.0% adults with diabetes     CBG: Recent Labs  Lab 04/18/21 1311  GLUCAP 127*     Review of Systems:   Positives in BOLD: Gen: Denies fever, chills, weight change, fatigue, night sweats HEENT: Denies blurred vision, double vision, hearing loss, tinnitus, sinus congestion, rhinorrhea, sore throat, neck stiffness, dysphagia PULM: Denies shortness of breath, cough, sputum production, hemoptysis, wheezing CV: Denies chest tightness, edema, orthopnea, paroxysmal nocturnal dyspnea, palpitations GI: Denies abdominal pain, nausea, vomiting, diarrhea, hematochezia, melena, constipation, change in bowel habits GU: Denies dysuria, hematuria, polyuria, oliguria, urethral discharge Endocrine: Denies hot or cold intolerance, polyuria, polyphagia or appetite change Derm: Denies rash, dry skin, scaling or peeling skin change Heme: Denies easy bruising, bleeding, bleeding gums Neuro: Denies  headache, numbness, weakness, slurred speech, loss of memory or consciousness   Past Medical History:  She,  has a past medical history of Anxiety, Arm pain (07/25/2015), CHF (congestive heart failure) (HCC), Congestive heart failure (HCC) (07/25/2015), COPD (chronic obstructive pulmonary disease) (HCC), Depression, Hypertension, Lower extremity pain (07/25/2015), Reflux, RSD (reflex sympathetic dystrophy), Spinal cord stimulator status, Vitamin B12 deficiency, and Vitamin D deficiency.   Surgical History:   Past Surgical History:  Procedure Laterality Date   SPINAL CORD STIMULATOR IMPLANT     x 2     Social History:   reports that she quit smoking about 3 years ago. Her smoking use included cigarettes. She has a 60.00 pack-year smoking history. She has never used smokeless tobacco. She reports that she does not drink alcohol and does not use drugs.   Family History:  Her family history includes Cancer in her father; Cirrhosis in her mother.   Allergies Allergies  Allergen Reactions   Lisinopril Anaphylaxis     Home Medications  Prior to Admission medications   Medication Sig Start Date End Date Taking? Authorizing Provider  atorvastatin (LIPITOR) 40 MG tablet Take 1 tablet (40 mg total) by mouth at bedtime. 08/18/18  Yes Enid Baas, MD  carvedilol (COREG) 3.125 MG tablet Take 1 tablet (3.125 mg total) by mouth 2 (two) times daily with a meal. 08/18/18  Yes Enid Baas, MD  cetirizine (ZYRTEC) 10 MG tablet Take 10 mg by mouth daily.   Yes [provider]  furosemide (LASIX) 20 MG tablet Take 1 tablet (20 mg total) by mouth daily. Patient taking differently: Take 20 mg by mouth 2 (two) times daily. 08/18/18  Yes Enid Baas, MD  HYDROcodone-acetaminophen (NORCO/VICODIN) 5-325 MG tablet Take 1 tablet by mouth every 6 (six) hours as needed for severe pain. Must last 30 days. 03/23/21 04/22/21 Yes Delano Metz, MD  mometasone-formoterol (DULERA) 100-5  MCG/ACT AERO Inhale 2 puffs into the lungs 2 (two) times daily. 08/18/18 04/17/21 Yes Enid Baas, MD  pantoprazole (PROTONIX) 40 MG tablet Take 40 mg by mouth daily. 03/19/21  Yes [provider]  potassium chloride SA (K-DUR,KLOR-CON) 20 MEQ tablet Take 20 mEq by mouth daily.   Yes [provider]  spironolactone (ALDACTONE) 25 MG tablet Take 25 mg by mouth daily. 06/29/19 04/17/21 Yes [provider]  albuterol (PROVENTIL HFA;VENTOLIN HFA) 108 (90 Base) MCG/ACT inhaler Inhale 2 puffs into the lungs every 6 (six) hours as needed for wheezing or shortness of breath. 11/26/17   Enid Baas, MD  HYDROcodone-acetaminophen (NORCO/VICODIN) 5-325 MG tablet Take 1 tablet by mouth every 6 (six) hours as needed for severe pain. Must last 30 days. 02/21/21 03/23/21  Delano Metz, MD  HYDROcodone-acetaminophen (  NORCO/VICODIN) 5-325 MG tablet Take 1 tablet by mouth every 6 (six) hours as needed for severe pain. Must last 30 days. 04/22/21 05/22/21  Delano Metz, MD  ipratropium-albuterol (DUONEB) 0.5-2.5 (3) MG/3ML SOLN Take 3 mLs by nebulization every 6 (six) hours as needed (wheezing). 01/06/21   Enedina Finner, MD  omeprazole (PRILOSEC) 20 MG capsule Take 20 mg by mouth daily.  Patient not taking: Reported on 04/17/2021    [provider]  ramelteon (ROZEREM) 8 MG tablet Take 8 mg by mouth at bedtime. Patient not taking: Reported on 04/17/2021    [provider]     Critical care provider statement:    Critical care time (minutes):  33   Critical care time was exclusive of:  Separately billable procedures and  treating other patients   Critical care was necessary to treat or prevent imminent or  life-threatening deterioration of the following conditions:  Acute on chronic hypoxemic respiratory failure, advanced COPD, multiple comorbid conditions.    Critical care was time spent personally by me on the following  activities:  Development of treatment plan  with patient or surrogate,  discussions with consultants, evaluation of patient's response to  treatment, examination of patient, obtaining history from patient or  surrogate, ordering and performing treatments and interventions, ordering  and review of laboratory studies and re-evaluation of patient's condition   I assumed direction of critical care for this patient from another  provider in my specialty: no        Vida Rigger, M.D.  Pulmonary & Critical Care Medicine  [facility]

## 2021-04-22 NOTE — Discharge Summary (Signed)
Physician Discharge Summary   Elizabeth [name]  female DOB: 1960-12-11  AYT:[id number]  PCP: Lorn Junes, FNP  Admit date: 04/17/2021 Discharge date: 04/22/2021  Admitted From: home Disposition:  home CODE STATUS: Full code  Discharge Instructions     Discharge instructions   Complete by: As directed    You have had COPD flare up and treated with steroid.  Please finish prednisone taper as directed at home.  Please also finish azithromycin for 6 more days at home, per pulmonology recommendation.  Please follow up with pulmonology Dr. Karna Christmas in outpatient clinic 1 week after discharge.   Dr. Darlin Priestly [facility] Course:  For full details, please see H&P, progress notes, consult notes and ancillary notes.  Briefly,  Elizabeth [name] is a 60 y.o. female with medical history significant for COPD, last hospitalized 3 months prior, anxiety, CHF with last echo 2019 showing EF 45% and grade 1 diastolic dysfunction, chronic pain syndrome on chronic opiates, HTN and tobacco use disorder who was brought in by EMS in respiratory distress with tachypnea in the 40s to 50s requiring CPAP.  She was transitioned to BiPAP on arrival.     Severe respiratory distress COPD with acute exacerbation  -Patient very tachypneic but with reassuring labs, VBG pH 7.40, PCO2 37 and PO2 69, troponin of 4 and BNP 67 --rapid RR appeared to be due in part to severe anxiety --No signs of PNA or fluid overload --started on IV solumedrol --transferred to stepdown on 7/8 due to severely elevated RR and distress for precedex gtt.  Off and back on the floor on 7/10. --Pt received DuoNeb q4h, Pulmicort neb BID, scheduled Robitussin and IV Morphine PRN for air hunger.   --Pt was discharged with slow prednisone taper and azithromycin (total 7 days per pulm). --Pt will follow up with pulm Dr. Karna Christmas in outpatient clinic.     Generalized anxiety disorder - anxiety was not improved  with ativan --transferred to stepdown for precedex gtt.  Off and back on the floor on 7/10. --cont atarax PRN     Chronic pain syndrome on chronic opioids --Pt was ordered IV morphine PRN for air hunger and pain.  Switched back to home Norco prior to discharge.     Congestive heart failure, chronic, combined - Last echo 2019 with EF 45% and grade 1 diastolic dysfunction - BNP normal at 67 and chest x-ray with chronic findings only and with normal troponin - CHF exacerbation not suspected --home lasix, coreg and aldactone held 2/2 low BP during hospitalization (contributed by precedex gtt).  All resumed at discharge.      HTN (hypertension) --home lasix, coreg and aldactone held 2/2 low BP during hospitalization (contributed by precedex gtt).  All resumed at discharge.     Discharge Diagnoses:  Principal Problem:   COPD with acute exacerbation (HCC) Active Problems:   Generalized anxiety disorder   Chronic pain syndrome   Congestive heart failure (HCC)   HTN (hypertension)   History of tobacco abuse   30 Day Unplanned Readmission Risk Score    Flowsheet Row ED to Hosp-Admission (Current) from 04/17/2021 in [facility] (1C)  30 Day Unplanned Readmission Risk Score (%) 27.82 Filed at 04/22/2021 0801       This score is the patient's risk of an unplanned readmission within 30 days of being discharged (0 -100%). The score is based on dignosis, age, lab data, medications, orders,  and past utilization.   Low:  0-14.9   Medium: 15-21.9   High: 22-29.9   Extreme: 30 and above          Discharge Instructions:  Allergies as of 04/22/2021       Reactions   Lisinopril Anaphylaxis        Medication List     STOP taking these medications    omeprazole 20 MG capsule Commonly known as: PRILOSEC   ramelteon 8 MG tablet Commonly known as: ROZEREM       TAKE these medications    albuterol 108 (90 Base) MCG/ACT inhaler Commonly known as:  VENTOLIN HFA Inhale 2 puffs into the lungs every 6 (six) hours as needed for wheezing or shortness of breath.   atorvastatin 40 MG tablet Commonly known as: LIPITOR Take 1 tablet (40 mg total) by mouth at bedtime.   azithromycin 250 MG tablet Commonly known as: ZITHROMAX Take 1 tablet (250 mg total) by mouth daily for 6 days. Start taking on: April 23, 2021   carvedilol 3.125 MG tablet Commonly known as: COREG Take 1 tablet (3.125 mg total) by mouth 2 (two) times daily with a meal.   cetirizine 10 MG tablet Commonly known as: ZYRTEC Take 10 mg by mouth daily.   furosemide 20 MG tablet Commonly known as: LASIX Take 1 tablet (20 mg total) by mouth daily. What changed: when to take this   guaiFENesin-dextromethorphan 100-10 MG/5ML syrup Commonly known as: ROBITUSSIN DM Take 10 mLs by mouth every 6 (six) hours as needed for cough.   HYDROcodone-acetaminophen 5-325 MG tablet Commonly known as: NORCO/VICODIN Take 1 tablet by mouth every 6 (six) hours as needed for severe pain. Must last 30 days. What changed: Another medication with the same name was removed. Continue taking this medication, and follow the directions you see here.   hydrOXYzine 50 MG tablet Commonly known as: ATARAX/VISTARIL Take 1 tablet (50 mg total) by mouth 2 (two) times daily as needed for anxiety.   ipratropium-albuterol 0.5-2.5 (3) MG/3ML Soln Commonly known as: DUONEB Take 3 mLs by nebulization every 6 (six) hours as needed (wheezing).   mometasone-formoterol 100-5 MCG/ACT Aero Commonly known as: DULERA Inhale 2 puffs into the lungs 2 (two) times daily.   pantoprazole 40 MG tablet Commonly known as: PROTONIX Take 40 mg by mouth daily.   potassium chloride SA 20 MEQ tablet Commonly known as: KLOR-CON Take 20 mEq by mouth daily.   predniSONE 10 MG tablet Commonly known as: DELTASONE Take 20 mg (2 tablets) from 7/13 to 7/17, then 10 mg (1 tablet) from 7/18 to 7/22, then done.   spironolactone  25 MG tablet Commonly known as: ALDACTONE Take 25 mg by mouth daily.         Follow-up Information     Lorn Junes, FNP Follow up in 1 week(s).   Specialty: Family Medicine Contact information: [address] [place] [address] [phone]         Vida Rigger, MD Follow up in 1 week(s).   Specialty: Pulmonary Disease Contact information: [address] [place] [address] [phone]                 Allergies  Allergen Reactions   Lisinopril Anaphylaxis     The results of significant diagnostics from this hospitalization (including imaging, microbiology, ancillary and laboratory) are listed below for reference.   Consultations:   Procedures/Studies: DG Chest Portable 1 View  Result Date: 04/17/2021 CLINICAL DATA:  Shortness of breath. EXAM: PORTABLE CHEST 1 VIEW COMPARISON:  01/03/2021 FINDINGS: The lungs are clear without focal pneumonia, edema, pneumothorax or pleural effusion. Interstitial markings are diffusely coarsened with chronic features. Cardiopericardial silhouette is at upper limits of normal for size. The visualized bony structures of the thorax show no acute abnormality. Cervicothoracic spinal stimulator leads again noted. Telemetry leads overlie the chest. IMPRESSION: Chronic interstitial coarsening.  No active disease. Electronically Signed   By: Kennith Center M.D.   On: 04/17/2021 18:32      Labs: BNP (last 3 results) Recent Labs    01/03/21 1622 04/17/21 1740  BNP 22.7 67.2   Basic Metabolic Panel: Recent Labs  Lab 04/18/21 0441 04/19/21 0457 04/20/21 0546 04/21/21 0454 04/22/21 0546  NA 138 137 139 142 140  K 4.1 3.7 3.2* 3.8 3.8  CL 108 104 106 106 104  CO2 24 25 27 29 30   GLUCOSE 126* 111* 98 109* 102*  BUN 30* 30* 30* 23* 20  CREATININE 0.68 0.61 0.73 0.64 0.62  CALCIUM 8.7* 8.7* 8.2* 8.5* 8.6*  MG  --  2.8* 2.7* 2.8* 2.5*  PHOS  --  4.7* 3.4  --   --    Liver Function Tests: No results  for input(s): AST, ALT, ALKPHOS, BILITOT, PROT, ALBUMIN in the last 168 hours. No results for input(s): LIPASE, AMYLASE in the last 168 hours. No results for input(s): AMMONIA in the last 168 hours. CBC: Recent Labs  Lab 04/17/21 1740 04/18/21 0441 04/19/21 0457 04/20/21 0546 04/21/21 0454 04/22/21 0546  WBC 11.5* 5.7 5.1 5.6 7.9 9.0  NEUTROABS 9.2*  --   --   --   --   --   HGB 10.8* 9.2* 9.6* 9.7* 10.0* 10.1*  HCT 35.5* 29.5* 30.1* 31.2* 31.9* 32.5*  MCV 87.4 84.5 87.0 85.7 86.7 86.2  PLT 388 260 279 275 285 285   Cardiac Enzymes: No results for input(s): CKTOTAL, CKMB, CKMBINDEX, TROPONINI in the last 168 hours. BNP: Invalid input(s): POCBNP CBG: Recent Labs  Lab 04/18/21 1311  GLUCAP 127*   D-Dimer No results for input(s): DDIMER in the last 72 hours. Hgb A1c No results for input(s): HGBA1C in the last 72 hours. Lipid Profile No results for input(s): CHOL, HDL, LDLCALC, TRIG, CHOLHDL, LDLDIRECT in the last 72 hours. Thyroid function studies No results for input(s): TSH, T4TOTAL, T3FREE, THYROIDAB in the last 72 hours.  Invalid input(s): FREET3 Anemia work up No results for input(s): VITAMINB12, FOLATE, FERRITIN, TIBC, IRON, RETICCTPCT in the last 72 hours. Urinalysis    Component Value Date/Time   COLORURINE STRAW (A) 05/24/2018 1150   APPEARANCEUR CLEAR (A) 05/24/2018 1150   LABSPEC 1.005 05/24/2018 1150   PHURINE 5.0 05/24/2018 1150   GLUCOSEU NEGATIVE 05/24/2018 1150   HGBUR NEGATIVE 05/24/2018 1150   BILIRUBINUR NEGATIVE 05/24/2018 1150   KETONESUR NEGATIVE 05/24/2018 1150   PROTEINUR NEGATIVE 05/24/2018 1150   NITRITE NEGATIVE 05/24/2018 1150   LEUKOCYTESUR TRACE (A) 05/24/2018 1150   Sepsis Labs Invalid input(s): PROCALCITONIN,  WBC,  LACTICIDVEN Microbiology Recent Results (from the past 240 hour(s))  Resp Panel by RT-PCR (Flu A&B, Covid) Nasopharyngeal Swab     Status: None   Collection Time: 04/18/21 10:45 AM   Specimen: Nasopharyngeal Swab;  Nasopharyngeal(NP) swabs in vial transport medium  Result Value Ref Range Status   SARS Coronavirus 2 by RT PCR NEGATIVE NEGATIVE Final    Comment: (NOTE) SARS-CoV-2 target nucleic acids are NOT DETECTED.  The SARS-CoV-2 RNA is generally detectable  in upper respiratory specimens during the acute phase of infection. The lowest concentration of SARS-CoV-2 viral copies this assay can detect is 138 copies/mL. A negative result does not preclude SARS-Cov-2 infection and should not be used as the sole basis for treatment or other patient management decisions. A negative result may occur with  improper specimen collection/handling, submission of specimen other than nasopharyngeal swab, presence of viral mutation(s) within the areas targeted by this assay, and inadequate number of viral copies(<138 copies/mL). A negative result must be combined with clinical observations, patient history, and epidemiological information. The expected result is Negative.  Fact Sheet for Patients:  BloggerCourse.comhttps://www.fda.gov/media/152166/download  Fact Sheet for Healthcare Providers:  SeriousBroker.ithttps://www.fda.gov/media/152162/download  This test is no t yet approved or cleared by the [place] FDA and  has been authorized for detection and/or diagnosis of SARS-CoV-2 by FDA under an Emergency Use Authorization (EUA). This EUA will remain  in effect (meaning this test can be used) for the duration of the COVID-19 declaration under Section 564(b)(1) of the Act, 21 U.S.C.section 360bbb-3(b)(1), unless the authorization is terminated  or revoked sooner.       Influenza A by PCR NEGATIVE NEGATIVE Final   Influenza B by PCR NEGATIVE NEGATIVE Final    Comment: (NOTE) The Xpert Xpress SARS-CoV-2/FLU/RSV plus assay is intended as an aid in the diagnosis of influenza from Nasopharyngeal swab specimens and should not be used as a sole basis for treatment. Nasal washings and aspirates are unacceptable for Xpert Xpress  SARS-CoV-2/FLU/RSV testing.  Fact Sheet for Patients: BloggerCourse.comhttps://www.fda.gov/media/152166/download  Fact Sheet for Healthcare Providers: SeriousBroker.ithttps://www.fda.gov/media/152162/download  This test is not yet approved or cleared by the [place] FDA and has been authorized for detection and/or diagnosis of SARS-CoV-2 by FDA under an Emergency Use Authorization (EUA). This EUA will remain in effect (meaning this test can be used) for the duration of the COVID-19 declaration under Section 564(b)(1) of the Act, 21 U.S.C. section 360bbb-3(b)(1), unless the authorization is terminated or revoked.  Performed at [facility], [address] [place], [place] [address]   MRSA Next Gen by PCR, Nasal     Status: None   Collection Time: 04/18/21  3:15 PM   Specimen: Nasal Mucosa; Nasal Swab  Result Value Ref Range Status   MRSA by PCR Next Gen NOT DETECTED NOT DETECTED Final    Comment: (NOTE) The GeneXpert MRSA Assay (FDA approved for NASAL specimens only), is one component of a comprehensive MRSA colonization surveillance program. It is not intended to diagnose MRSA infection nor to guide or monitor treatment for MRSA infections. Test performance is not FDA approved in patients less than 60 years old. Performed at [facility], [address] [place], [place] [address]      Total time spend on discharging this patient, including the last patient exam, discussing the hospital stay, instructions for ongoing care as it relates to all pertinent caregivers, as well as preparing the medical discharge records, prescriptions, and/or referrals as applicable, is 40 minutes.    Darlin Priestlyina Rosmery Duggin, MD  [facility] 04/22/2021, 9:40 AM

## 2021-04-22 NOTE — TOC Transition Note (Signed)
Transition of Care [facility]) - CM/SW Discharge Note   Patient Details  Name: Elizabeth [name] MRN: [id number] Date of Birth: 1960-12-04  [facility]) CM/SW Contact:  Allayne Butcher, RN Phone Number: 04/22/2021, 10:22 AM   Clinical Narrative:    Patient medically cleared for discharge.  TOC ordered patient a new nebulizer machine as patient reports hers does not work at home.          Patient Goals and CMS Choice        Discharge Placement                       Discharge Plan and Services                                     Social Determinants of Health (SDOH) Interventions     Readmission Risk Interventions Readmission Risk Prevention Plan 04/22/2021  Transportation Screening Complete  PCP or Specialist Appt within 3-5 Days Complete  HRI or Home Care Consult Complete  Social Work Consult for Recovery Care Planning/Counseling Complete  Palliative Care Screening Not Applicable  Medication Review [employer]) Referral to Pharmacy  Some recent data might be hidden

## 2021-04-22 NOTE — Progress Notes (Signed)
Elizabeth [name] to be D/C'd Home per MD order.  Discussed prescriptions and follow up appointments with the patient. Prescriptions were e-prescribed, medication list explained in detail. Pt verbalized understanding. Meds returned to pt from pharmacy.  Allergies as of 04/22/2021       Reactions   Lisinopril Anaphylaxis        Medication List     STOP taking these medications    omeprazole 20 MG capsule Commonly known as: PRILOSEC   ramelteon 8 MG tablet Commonly known as: ROZEREM       TAKE these medications    albuterol 108 (90 Base) MCG/ACT inhaler Commonly known as: VENTOLIN HFA Inhale 2 puffs into the lungs every 6 (six) hours as needed for wheezing or shortness of breath.   atorvastatin 40 MG tablet Commonly known as: LIPITOR Take 1 tablet (40 mg total) by mouth at bedtime.   azithromycin 250 MG tablet Commonly known as: ZITHROMAX Take 1 tablet (250 mg total) by mouth daily for 6 days. Start taking on: April 23, 2021   carvedilol 3.125 MG tablet Commonly known as: COREG Take 1 tablet (3.125 mg total) by mouth 2 (two) times daily with a meal.   cetirizine 10 MG tablet Commonly known as: ZYRTEC Take 10 mg by mouth daily.   furosemide 20 MG tablet Commonly known as: LASIX Take 1 tablet (20 mg total) by mouth daily. What changed: when to take this   guaiFENesin-dextromethorphan 100-10 MG/5ML syrup Commonly known as: ROBITUSSIN DM Take 10 mLs by mouth every 6 (six) hours as needed for cough.   HYDROcodone-acetaminophen 5-325 MG tablet Commonly known as: NORCO/VICODIN Take 1 tablet by mouth every 6 (six) hours as needed for severe pain. Must last 30 days. What changed: Another medication with the same name was removed. Continue taking this medication, and follow the directions you see here.   hydrOXYzine 50 MG tablet Commonly known as: ATARAX/VISTARIL Take 1 tablet (50 mg total) by mouth 2 (two) times daily as needed for anxiety.    ipratropium-albuterol 0.5-2.5 (3) MG/3ML Soln Commonly known as: DUONEB Take 3 mLs by nebulization every 6 (six) hours as needed (wheezing).   mometasone-formoterol 100-5 MCG/ACT Aero Commonly known as: DULERA Inhale 2 puffs into the lungs 2 (two) times daily.   pantoprazole 40 MG tablet Commonly known as: PROTONIX Take 40 mg by mouth daily.   potassium chloride SA 20 MEQ tablet Commonly known as: KLOR-CON Take 20 mEq by mouth daily.   predniSONE 10 MG tablet Commonly known as: DELTASONE Take 20 mg (2 tablets) from 7/13 to 7/17, then 10 mg (1 tablet) from 7/18 to 7/22, then done.   spironolactone 25 MG tablet Commonly known as: ALDACTONE Take 25 mg by mouth daily.               Durable Medical Equipment  (From admission, onward)           Start     Ordered   04/22/21 1020  For home use only DME Nebulizer machine  Once       Question Answer Comment  Patient needs a nebulizer to treat with the following condition COPD (chronic obstructive pulmonary disease) (HCC)   Length of Need Lifetime      04/22/21 1019            Vitals:   04/22/21 0759 04/22/21 0819  BP:  109/63  Pulse:  66  Resp:  20  Temp:  97.9 F (36.6 C)  SpO2: 92% 96%  Tele box removed and returned. Skin clean, dry and intact without evidence of skin break down, no evidence of skin tears noted. IV catheter discontinued intact. Site without signs and symptoms of complications. Dressing and pressure applied. Pt denies pain at this time. No complaints noted.  An After Visit Summary was printed and given to the patient. Patient escorted via [place], and D/C home via private auto.  Elizabeth [name]

## 2021-04-22 NOTE — Progress Notes (Signed)
IV removed before discharge. Patient was educated on discharge instructions and medications. Patient going home POV with son.

## 2021-04-22 NOTE — Plan of Care (Signed)
Care plan reviewed with Patient

## 2021-05-19 ENCOUNTER — Encounter: Payer: Medicaid Other | Admitting: Pain Medicine

## 2021-05-19 NOTE — Progress Notes (Signed)
PROVIDER NOTE: Information contained herein reflects review and annotations entered in association with encounter. Interpretation of such information and data should be left to medically-trained personnel. Information provided to patient can be located elsewhere in the medical record under "Patient Instructions". Document created using STT-dictation technology, any transcriptional errors that may result from process are unintentional.    Patient: Elizabeth [name]  Service Category: E/M  Provider: Gaspar Cola, MD  DOB: [birth year]  DOS: 05/22/2021  Specialty: Interventional Pain Management  MRN: [id number]  Setting: Ambulatory outpatient  PCP: Bunnie Pion, FNP  Type: Established Patient    Referring Provider: Bunnie Pion, FNP  Location: Office  Delivery: Face-to-face     HPI  Ms. Netasha Wehrli Finklea, a 60 y.o. year old female, is here today because of her Chronic pain syndrome [G89.4]. Ms. Muldrow's primary complain today is Back Pain (lower) Last encounter: My last encounter with her was on 02/19/2021. Pertinent problems: Ms. Longbottom has Chronic low back pain (Bilateral) w/o sciatica; Chronic pain syndrome; Presence of spinal cord stimulator; Chronic lower extremity pain (1ry area of Pain) (Bilateral); Lumbar facet syndrome (Bilateral); Lumbar spondylosis; Complex regional pain syndrome type 1 of upper extremities (Bilateral); and Complex regional pain syndrome type 1 of lower extremities (Bilateral) on their pertinent problem list. Pain Assessment: Severity of Chronic pain is reported as a 3 /10. Location: Back Lower/denies,. Onset: More than a month ago. Quality: Aching, Contraction, Nagging, Discomfort. Timing: Constant. Modifying factor(s): medication,rest. Vitals:  height is _0  (1.448 m) and weight is 128 lb (58.1 kg). Her temporal temperature is 97.1 F (36.2 C) (abnormal). Her blood pressure is 127/69 and her pulse is 86. Her respiration is 16 and oxygen saturation is 96%.    Reason for encounter: medication management.   The patient indicates doing well with the current medication regimen. No adverse reactions or side effects reported to the medications.   RTCB: 09/19/2021  Pharmacotherapy Assessment  Analgesic: Oxycodone IR 5 mg, 1 tab PO q 6 hrs (20 mg/day of oxycodone) MME/day: 30 mg/day.   Monitoring: [place] PMP: PDMP reviewed during this encounter.       Pharmacotherapy: No side-effects or adverse reactions reported. Compliance: No problems identified. Effectiveness: Clinically acceptable.  Ignatius Specking, RN  05/22/2021  2:40 PM  Sign when Signing Visit Nursing Pain Medication Assessment:  Safety precautions to be maintained throughout the outpatient stay will include: orient to surroundings, keep bed in low position, maintain call bell within reach at all times, provide assistance with transfer out of bed and ambulation.  Medication Inspection Compliance: Pill count conducted under aseptic conditions, in front of the patient. Neither the pills nor the bottle was removed from the patient's sight at any time. Once count was completed pills were immediately returned to the patient in their original bottle.  Medication: See above Pill/Patch Count:  2 of 120 pills remain Pill/Patch Appearance: Markings consistent with prescribed medication Bottle Appearance: Standard pharmacy container. Clearly labeled. Filled Date: 7 / 12 / 2022 Last Medication intake:  Today     UDS:  Summary  Date Value Ref Range Status  02/19/2021 Note  Final    Comment:    ==================================================================== [employer] (MW) ==================================================================== Test                             Result       Flag       Units  Drug Present and  Declared for Prescription Verification   Hydrocodone                    4515         EXPECTED   ng/mg creat   Hydromorphone                  325          EXPECTED    ng/mg creat   Dihydrocodeine                 467          EXPECTED   ng/mg creat   Norhydrocodone                 >2591        EXPECTED   ng/mg creat    Sources of hydrocodone include scheduled prescription medications.    Hydromorphone, dihydrocodeine and norhydrocodone are expected    metabolites of hydrocodone. Hydromorphone and dihydrocodeine are    also available as scheduled prescription medications.  ==================================================================== Test                      Result    Flag   Units      Ref Range   Creatinine              193              mg/dL      >=20 ==================================================================== Declared Medications:  The flagging and interpretation on this report are based on the  following declared medications.  Unexpected results may arise from  inaccuracies in the declared medications.   **Note: The testing scope of this panel includes these medications:   Hydrocodone (Norco)   **Note: The testing scope of this panel does not include the  following reported medications:   Acetaminophen (Norco)  Albuterol (Ventolin HFA)  Albuterol (Duoneb)  Atorvastatin (Lipitor)  Azithromycin (Zithromax)  Carvedilol (Coreg)  Cetirizine (Zyrtec)  Formoterol (Dulera)  Furosemide (Lasix)  Ipratropium (Duoneb)  Mometasone (Dulera)  Omeprazole (Prilosec)  Polyethylene Glycol (MiraLAX)  Potassium (Klor-Con)  Prednisone (Deltasone)  Ramelteon (Rozerem)  Spironolactone (Aldactone) ==================================================================== For clinical consultation, please call [phone]. ====================================================================      ROS  Constitutional: Denies any fever or chills Gastrointestinal: No reported hemesis, hematochezia, vomiting, or acute GI distress Musculoskeletal: Denies any acute onset joint swelling, redness, loss of ROM, or weakness Neurological: No reported  episodes of acute onset apraxia, aphasia, dysarthria, agnosia, amnesia, paralysis, loss of coordination, or loss of consciousness  Medication Review  HYDROcodone-acetaminophen, albuterol, atorvastatin, carvedilol, cetirizine, furosemide, guaiFENesin-dextromethorphan, hydrOXYzine, ipratropium-albuterol, mometasone-formoterol, pantoprazole, potassium chloride SA, predniSONE, and spironolactone  History Review  Allergy: Ms. Najera is allergic to lisinopril. Drug: Ms. Ismael  reports no history of drug use. Alcohol:  reports no history of alcohol use. Tobacco:  reports that she quit smoking about 3 years ago. Her smoking use included cigarettes. She has a 60.00 pack-year smoking history. She has never used smokeless tobacco. Social: Ms. Freeman  reports that she quit smoking about 3 years ago. Her smoking use included cigarettes. She has a 60.00 pack-year smoking history. She has never used smokeless tobacco. She reports that she does not drink alcohol and does not use drugs. Medical:  has a past medical history of Anxiety, Arm pain (07/25/2015), CHF (congestive heart failure) ([place]), Congestive heart failure ([place]) (07/25/2015), COPD (chronic obstructive pulmonary disease) ([place]), Depression, Hypertension, Lower extremity pain (07/25/2015), Reflux, RSD (reflex  sympathetic dystrophy), Spinal cord stimulator status, Vitamin B12 deficiency, and Vitamin D deficiency. Surgical: Ms. Frontera  has a past surgical history that includes Spinal cord stimulator implant. Family: family history includes Cancer in her father; Cirrhosis in her mother.  Laboratory Chemistry Profile   Renal Lab Results  Component Value Date   BUN 20 04/22/2021   CREATININE 0.62 04/22/2021   BCR 17 08/30/2017   GFRAA >60 08/18/2018   GFRNONAA >60 04/22/2021    Hepatic Lab Results  Component Value Date   AST 25 01/03/2021   ALT 20 01/03/2021   ALBUMIN 4.1 01/03/2021   ALKPHOS 82 01/03/2021    Electrolytes Lab Results   Component Value Date   NA 140 04/22/2021   K 3.8 04/22/2021   CL 104 04/22/2021   CALCIUM 8.6 (L) 04/22/2021   MG 2.5 (H) 04/22/2021   PHOS 3.4 04/20/2021    Bone Lab Results  Component Value Date   VD25OH 25.3 (L) 03/18/2016   VD125OH2TOT 76.2 03/18/2016   25OHVITD1 37 08/30/2017   25OHVITD2 1.0 08/30/2017   25OHVITD3 36 08/30/2017    Inflammation (CRP: Acute Phase) (ESR: Chronic Phase) Lab Results  Component Value Date   CRP 4.4 08/30/2017   ESRSEDRATE 30 08/30/2017         Note: Above Lab results reviewed.  Recent Imaging Review  DG Chest Portable 1 View CLINICAL DATA:  Shortness of breath.  EXAM: PORTABLE CHEST 1 VIEW  COMPARISON:  01/03/2021  FINDINGS: The lungs are clear without focal pneumonia, edema, pneumothorax or pleural effusion. Interstitial markings are diffusely coarsened with chronic features. Cardiopericardial silhouette is at upper limits of normal for size. The visualized bony structures of the thorax show no acute abnormality. Cervicothoracic spinal stimulator leads again noted. Telemetry leads overlie the chest.  IMPRESSION: Chronic interstitial coarsening.  No active disease.  Electronically Signed   By: Misty Stanley M.D.   On: 04/17/2021 18:32 Note: Reviewed        Physical Exam  General appearance: Well nourished, well developed, and well hydrated. In no apparent acute distress Mental status: Alert, oriented x 3 (person, place, & time)       Respiratory: No evidence of acute respiratory distress Eyes: PERLA Vitals: BP 127/69 (BP Location: Right Arm, Patient Position: Sitting, Cuff Size: Normal)   Pulse 86   Temp (!) 97.1 F (36.2 C) (Temporal)   Resp 16   Ht _0  (1.448 m)   Wt 128 lb (58.1 kg)   SpO2 96%   BMI 27.70 kg/m  BMI: Estimated body mass index is 27.7 kg/m as calculated from the following:   Height as of this encounter: _1  (1.448 m).   Weight as of this encounter: 128 lb (58.1 kg). Ideal: Patient must be at  least 60 in tall to calculate ideal body weight  Assessment   Status Diagnosis  Controlled Controlled Controlled 1. Chronic pain syndrome   2. Chronic lower extremity pain (1ry area of Pain) (Bilateral)   3. Complex regional pain syndrome type 1 of upper extremities (Bilateral)   4. Complex regional pain syndrome type 1 of lower extremities (Bilateral)   5. Presence of spinal cord stimulator   6. Pharmacologic therapy   7. Chronic use of opiate for therapeutic purpose   8. Encounter for medication management      Updated Problems: No problems updated.  Plan of Care  Problem-specific:  No problem-specific Assessment & Plan notes found for this encounter.  Ms. Angeletta Goelz Rufus has  a current medication list which includes the following long-term medication(s): albuterol, atorvastatin, carvedilol, cetirizine, furosemide, hydrocodone-acetaminophen, [START ON 06/21/2021] hydrocodone-acetaminophen, [START ON 07/21/2021] hydrocodone-acetaminophen, [START ON 08/20/2021] hydrocodone-acetaminophen, ipratropium-albuterol, potassium chloride sa, mometasone-formoterol, and spironolactone.  Pharmacotherapy (Medications Ordered): Meds ordered this encounter  Medications   HYDROcodone-acetaminophen (NORCO/VICODIN) 5-325 MG tablet    Sig: Take 1 tablet by mouth every 6 (six) hours as needed for severe pain. Must last 30 days.    Dispense:  120 tablet    Refill:  0    Not a duplicate. Do NOT delete! Dispense 1 day early if closed on refill date. Avoid benzodiazepines within 8 hours of opioids. Do not send refill requests.   HYDROcodone-acetaminophen (NORCO/VICODIN) 5-325 MG tablet    Sig: Take 1 tablet by mouth every 6 (six) hours as needed for severe pain. Must last 30 days.    Dispense:  120 tablet    Refill:  0    Not a duplicate. Do NOT delete! Dispense 1 day early if closed on refill date. Avoid benzodiazepines within 8 hours of opioids. Do not send refill requests.    HYDROcodone-acetaminophen (NORCO/VICODIN) 5-325 MG tablet    Sig: Take 1 tablet by mouth every 6 (six) hours as needed for severe pain. Must last 30 days.    Dispense:  120 tablet    Refill:  0    Not a duplicate. Do NOT delete! Dispense 1 day early if closed on refill date. Avoid benzodiazepines within 8 hours of opioids. Do not send refill requests.   HYDROcodone-acetaminophen (NORCO/VICODIN) 5-325 MG tablet    Sig: Take 1 tablet by mouth every 6 (six) hours as needed for severe pain. Must last 30 days.    Dispense:  120 tablet    Refill:  0    Not a duplicate. Do NOT delete! Dispense 1 day early if closed on refill date. Avoid benzodiazepines within 8 hours of opioids. Do not send refill requests.    Orders:  No orders of the defined types were placed in this encounter.  Follow-up plan:   Return in about 4 months (around 09/19/2021) for (M,W) (F2F) (MM).     Interventional therapies:  Considering:   Diagnostic bilateral lumbar facet block  Possible bilateral lumbar facet RFA  Possible bilateral lumbar sympathetic RFA    Palliative PRN treatment(s):   Palliative right vs left SGBs  Palliative  right vs left LSBs       Recent Visits No visits were found meeting these conditions. Showing recent visits within past 90 days and meeting all other requirements Today's Visits Date Type Provider Dept  05/22/21 Office Visit Milinda Pointer, MD [facility]  Showing today's visits and meeting all other requirements Future Appointments No visits were found meeting these conditions. Showing future appointments within next 90 days and meeting all other requirements I discussed the assessment and treatment plan with the patient. The patient was provided an opportunity to ask questions and all were answered. The patient agreed with the plan and demonstrated an understanding of the instructions.  Patient advised to call back or seek an in-person evaluation if the symptoms or  condition worsens.  Duration of encounter: 30 minutes.  Note by: Gaspar Cola, MD Date: 05/22/2021; Time: 7:18 PM

## 2021-05-22 ENCOUNTER — Ambulatory Visit: Payer: Medicaid Other | Attending: Pain Medicine | Admitting: Pain Medicine

## 2021-05-22 ENCOUNTER — Other Ambulatory Visit: Payer: Self-pay

## 2021-05-22 ENCOUNTER — Encounter: Payer: Self-pay | Admitting: Pain Medicine

## 2021-05-22 VITALS — BP 127/69 | HR 86 | Temp 97.1°F | Resp 16 | Ht <= 58 in | Wt 128.0 lb

## 2021-05-22 DIAGNOSIS — Z79899 Other long term (current) drug therapy: Secondary | ICD-10-CM | POA: Diagnosis present

## 2021-05-22 DIAGNOSIS — G8929 Other chronic pain: Secondary | ICD-10-CM | POA: Diagnosis present

## 2021-05-22 DIAGNOSIS — G90513 Complex regional pain syndrome I of upper limb, bilateral: Secondary | ICD-10-CM | POA: Diagnosis not present

## 2021-05-22 DIAGNOSIS — Z79891 Long term (current) use of opiate analgesic: Secondary | ICD-10-CM

## 2021-05-22 DIAGNOSIS — Z969 Presence of functional implant, unspecified: Secondary | ICD-10-CM

## 2021-05-22 DIAGNOSIS — G894 Chronic pain syndrome: Secondary | ICD-10-CM | POA: Diagnosis not present

## 2021-05-22 DIAGNOSIS — M79604 Pain in right leg: Secondary | ICD-10-CM

## 2021-05-22 DIAGNOSIS — G90523 Complex regional pain syndrome I of lower limb, bilateral: Secondary | ICD-10-CM | POA: Diagnosis not present

## 2021-05-22 DIAGNOSIS — M79605 Pain in left leg: Secondary | ICD-10-CM

## 2021-05-22 MED ORDER — HYDROCODONE-ACETAMINOPHEN 5-325 MG PO TABS: 1.0000 | ORAL_TABLET | Freq: Four times a day (QID) | ORAL | 0 refills | Status: DC | PRN

## 2021-05-22 NOTE — Progress Notes (Signed)
Nursing Pain Medication Assessment:  Safety precautions to be maintained throughout the outpatient stay will include: orient to surroundings, keep bed in low position, maintain call bell within reach at all times, provide assistance with transfer out of bed and ambulation.  Medication Inspection Compliance: Pill count conducted under aseptic conditions, in front of the patient. Neither the pills nor the bottle was removed from the patient's sight at any time. Once count was completed pills were immediately returned to the patient in their original bottle.  Medication: See above Pill/Patch Count:  2 of 120 pills remain Pill/Patch Appearance: Markings consistent with prescribed medication Bottle Appearance: Standard pharmacy container. Clearly labeled. Filled Date: 7 / 12 / 2022 Last Medication intake:  Today

## 2021-06-24 ENCOUNTER — Telehealth: Payer: Self-pay | Admitting: Pain Medicine

## 2021-06-24 NOTE — Telephone Encounter (Signed)
PA denied via CMM.  Resent via [facility] with records 06/24/21 LP

## 2021-06-24 NOTE — Telephone Encounter (Signed)
Patient called stating pharmacy needs PA on her pain meds. Please advise patient when she can pick up

## 2021-09-16 NOTE — Progress Notes (Signed)
PROVIDER NOTE: Information contained herein reflects review and annotations entered in association with encounter. Interpretation of such information and data should be left to medically-trained personnel. Information provided to patient can be located elsewhere in the medical record under "Patient Instructions". Document created using STT-dictation technology, any transcriptional errors that may result from process are unintentional.    Patient: Elizabeth [name]  Service Category: E/M  Provider: Gaspar Cola, MD  DOB: February 07, 1961  DOS: 09/17/2021  Specialty: Interventional Pain Management  MRN: [id number]  Setting: Ambulatory outpatient  PCP: Elizabeth Pion, FNP  Type: Established Patient    Referring Provider: Bunnie Pion, FNP  Location: Office  Delivery: Face-to-face     HPI  [name], a 60 y.o. year old female, is here today because of her No primary diagnosis found.. Elizabeth [name]'s primary complain today is Other (Complex regional pain syndrome ) Last encounter: My last encounter with her was on 06/24/2021. Pertinent problems: Elizabeth [name] has Chronic low back pain (Bilateral) w/o sciatica; Chronic pain syndrome; Presence of spinal cord stimulator; Chronic lower extremity pain (1ry area of Pain) (Bilateral); Lumbar facet syndrome (Bilateral); Lumbar spondylosis; Complex regional pain syndrome type 1 of upper extremities (Bilateral); and Complex regional pain syndrome type 1 of lower extremities (Bilateral) on their pertinent problem list. Pain Assessment: Severity of Chronic pain is reported as a 3 /10. Location: Other (Comment) (all over pain from CRPS) Other (Comment)/denies. Onset: More than a month ago. Quality: Discomfort, Constant, Nagging. Timing: Constant. Modifying factor(s): medications. Vitals:  height is _0  (1.448 m) and weight is 128 lb (58.1 kg). Her temporal temperature is 97.2 F (36.2 C) (abnormal). Her blood pressure is 129/65 and her pulse is 82.  Her respiration is 16 and oxygen saturation is 100%.   Reason for encounter: medication management.   The patient indicates doing well with the current medication regimen. No adverse reactions or side effects reported to the medications.   RTCB: 12/18/2021  Pharmacotherapy Assessment  Analgesic: Oxycodone IR 5 mg, 1 tab PO q 6 hrs (20 mg/day of oxycodone) MME/day: 30 mg/day.   Monitoring: [place] PMP: PDMP reviewed during this encounter.       Pharmacotherapy: No side-effects or adverse reactions reported. Compliance: No problems identified. Effectiveness: Clinically acceptable.  Elizabeth Billow, RN  09/17/2021  2:54 PM  Sign when Signing Visit Nursing Pain Medication Assessment:  Safety precautions to be maintained throughout the outpatient stay will include: orient to surroundings, keep bed in low position, maintain call bell within reach at all times, provide assistance with transfer out of bed and ambulation.  Medication Inspection Compliance: Pill count conducted under aseptic conditions, in front of the patient. Neither the pills nor the bottle was removed from the patient's sight at any time. Once count was completed pills were immediately returned to the patient in their original bottle.  Medication: Hydrocodone/APAP Pill/Patch Count:  5 of 120 pills remain Pill/Patch Appearance: Markings consistent with prescribed medication Bottle Appearance: Standard pharmacy container. Clearly labeled. Filled Date: 46 / 09 / 2022 Last Medication intake:  Today    UDS:  Summary  Date Value Ref Range Status  02/19/2021 Note  Final    Comment:    ==================================================================== [facility] (MW) ==================================================================== Test                             Result       Flag  Units  Drug Present and Declared for Prescription Verification   Hydrocodone                    4515         EXPECTED   ng/mg  creat   Hydromorphone                  325          EXPECTED   ng/mg creat   Dihydrocodeine                 467          EXPECTED   ng/mg creat   Norhydrocodone                 >2591        EXPECTED   ng/mg creat    Sources of hydrocodone include scheduled prescription medications.    Hydromorphone, dihydrocodeine and norhydrocodone are expected    metabolites of hydrocodone. Hydromorphone and dihydrocodeine are    also available as scheduled prescription medications.  ==================================================================== Test                      Result    Flag   Units      Ref Range   Creatinine              193              mg/dL      >=20 ==================================================================== Declared Medications:  The flagging and interpretation on this report are based on the  following declared medications.  Unexpected results may arise from  inaccuracies in the declared medications.   **Note: The testing scope of this panel includes these medications:   Hydrocodone (Norco)   **Note: The testing scope of this panel does not include the  following reported medications:   Acetaminophen (Norco)  Albuterol (Ventolin HFA)  Albuterol (Duoneb)  Atorvastatin (Lipitor)  Azithromycin (Zithromax)  Carvedilol (Coreg)  Cetirizine (Zyrtec)  Formoterol (Dulera)  Furosemide (Lasix)  Ipratropium (Duoneb)  Mometasone (Dulera)  Omeprazole (Prilosec)  Polyethylene Glycol (MiraLAX)  Potassium (Klor-Con)  Prednisone (Deltasone)  Ramelteon (Rozerem)  Spironolactone (Aldactone) ==================================================================== For clinical consultation, please call [phone]. ====================================================================      ROS  Constitutional: Denies any fever or chills Gastrointestinal: No reported hemesis, hematochezia, vomiting, or acute GI distress Musculoskeletal: Denies any acute onset joint  swelling, redness, loss of ROM, or weakness Neurological: No reported episodes of acute onset apraxia, aphasia, dysarthria, agnosia, amnesia, paralysis, loss of coordination, or loss of consciousness  Medication Review  HYDROcodone-acetaminophen, albuterol, atorvastatin, carvedilol, cetirizine, furosemide, guaiFENesin-dextromethorphan, hydrOXYzine, ipratropium-albuterol, mometasone-formoterol, pantoprazole, potassium chloride SA, predniSONE, and spironolactone  History Review  Allergy: Elizabeth [name] is allergic to lisinopril. Drug: Elizabeth [name]  reports no history of drug use. Alcohol:  reports no history of alcohol use. Tobacco:  reports that she quit smoking about 3 years ago. Her smoking use included cigarettes. She has a 60.00 pack-year smoking history. She has never used smokeless tobacco. Social: Elizabeth [name]  reports that she quit smoking about 3 years ago. Her smoking use included cigarettes. She has a 60.00 pack-year smoking history. She has never used smokeless tobacco. She reports that she does not drink alcohol and does not use drugs. Medical:  has a past medical history of Anxiety, Arm pain (07/25/2015), CHF (congestive heart failure) ([place]), Congestive heart failure ([place]) (07/25/2015), COPD (chronic obstructive pulmonary disease) ([place]), Depression, Hypertension, Lower extremity  pain (07/25/2015), Reflux, RSD (reflex sympathetic dystrophy), Spinal cord stimulator status, Vitamin B12 deficiency, and Vitamin D deficiency. Surgical: Elizabeth [name]  has a past surgical history that includes Spinal cord stimulator implant. Family: family history includes Cancer in her father; Cirrhosis in her mother.  Laboratory Chemistry Profile   Renal Lab Results  Component Value Date   BUN 20 04/22/2021   CREATININE 0.62 04/22/2021   BCR 17 08/30/2017   GFRAA >60 08/18/2018   GFRNONAA >60 04/22/2021    Hepatic Lab Results  Component Value Date   AST 25 01/03/2021   ALT 20 01/03/2021   ALBUMIN 4.1  01/03/2021   ALKPHOS 82 01/03/2021    Electrolytes Lab Results  Component Value Date   NA 140 04/22/2021   K 3.8 04/22/2021   CL 104 04/22/2021   CALCIUM 8.6 (L) 04/22/2021   MG 2.5 (H) 04/22/2021   PHOS 3.4 04/20/2021    Bone Lab Results  Component Value Date   VD25OH 25.3 (L) 03/18/2016   VD125OH2TOT 76.2 03/18/2016   25OHVITD1 37 08/30/2017   25OHVITD2 1.0 08/30/2017   25OHVITD3 36 08/30/2017    Inflammation (CRP: Acute Phase) (ESR: Chronic Phase) Lab Results  Component Value Date   CRP 4.4 08/30/2017   ESRSEDRATE 30 08/30/2017         Note: Above Lab results reviewed.  Recent Imaging Review  DG Chest Portable 1 View CLINICAL DATA:  Shortness of breath.  EXAM: PORTABLE CHEST 1 VIEW  COMPARISON:  01/03/2021  FINDINGS: The lungs are clear without focal pneumonia, edema, pneumothorax or pleural effusion. Interstitial markings are diffusely coarsened with chronic features. Cardiopericardial silhouette is at upper limits of normal for size. The visualized bony structures of the thorax show no acute abnormality. Cervicothoracic spinal stimulator leads again noted. Telemetry leads overlie the chest.  IMPRESSION: Chronic interstitial coarsening.  No active disease.  Electronically Signed   By: Misty Stanley M.D.   On: 04/17/2021 18:32 Note: Reviewed        Physical Exam  General appearance: Well nourished, well developed, and well hydrated. In no apparent acute distress Mental status: Alert, oriented x 3 (person, place, & time)       Respiratory: No evidence of acute respiratory distress Eyes: PERLA Vitals: BP 129/65 (BP Location: Right Arm, Patient Position: Sitting, Cuff Size: Normal)   Pulse 82   Temp (!) 97.2 F (36.2 C) (Temporal)   Resp 16   Ht _0  (1.448 m)   Wt 128 lb (58.1 kg)   SpO2 100%   BMI 27.70 kg/m  BMI: Estimated body mass index is 27.7 kg/m as calculated from the following:   Height as of this encounter: _1  (1.448 m).    Weight as of this encounter: 128 lb (58.1 kg). Ideal: Patient must be at least 60 in tall to calculate ideal body weight  Assessment   Status Diagnosis  Controlled Controlled Controlled 1. Chronic pain syndrome   2. Chronic lower extremity pain (1ry area of Pain) (Bilateral)   3. Complex regional pain syndrome type 1 of upper extremities (Bilateral)   4. Complex regional pain syndrome type 1 of lower extremities (Bilateral)   5. Presence of spinal cord stimulator   6. Pharmacologic therapy   7. Chronic use of opiate for therapeutic purpose   8. Encounter for medication management      Updated Problems: No problems updated.  Plan of Care  Problem-specific:  No problem-specific Assessment & Plan notes found for this encounter.  Elizabeth [name] has a current medication list which includes the following long-term medication(s): albuterol, atorvastatin, carvedilol, furosemide, ipratropium-albuterol, mometasone-formoterol, potassium chloride sa, spironolactone, cetirizine, [START ON 09/19/2021] hydrocodone-acetaminophen, [START ON 10/19/2021] hydrocodone-acetaminophen, and [START ON 11/18/2021] hydrocodone-acetaminophen.  Pharmacotherapy (Medications Ordered): Meds ordered this encounter  Medications   HYDROcodone-acetaminophen (NORCO/VICODIN) 5-325 MG tablet    Sig: Take 1 tablet by mouth every 6 (six) hours as needed for severe pain. Must last 30 days.    Dispense:  120 tablet    Refill:  0    DO NOT: delete (not duplicate); no partial-fill (will deny script to complete), no refill request (F/U required). DISPENSE: 1 day early if closed on fill date. WARN: No CNS-depressants within 8 hrs of med.   HYDROcodone-acetaminophen (NORCO/VICODIN) 5-325 MG tablet    Sig: Take 1 tablet by mouth every 6 (six) hours as needed for severe pain. Must last 30 days.    Dispense:  120 tablet    Refill:  0    DO NOT: delete (not duplicate); no partial-fill (will deny script to complete), no  refill request (F/U required). DISPENSE: 1 day early if closed on fill date. WARN: No CNS-depressants within 8 hrs of med.   HYDROcodone-acetaminophen (NORCO/VICODIN) 5-325 MG tablet    Sig: Take 1 tablet by mouth every 6 (six) hours as needed for severe pain. Must last 30 days.    Dispense:  120 tablet    Refill:  0    DO NOT: delete (not duplicate); no partial-fill (will deny script to complete), no refill request (F/U required). DISPENSE: 1 day early if closed on fill date. WARN: No CNS-depressants within 8 hrs of med.    Orders:  No orders of the defined types were placed in this encounter.  Follow-up plan:   Return in about 3 months (around 12/18/2021) for Eval-day (M,W), (F2F), (MM).     Interventional therapies:  Considering:   Diagnostic bilateral lumbar facet block  Possible bilateral lumbar facet RFA  Possible bilateral lumbar sympathetic RFA    Palliative PRN treatment(s):   Palliative right vs left SGBs  Palliative  right vs left LSBs        Recent Visits No visits were found meeting these conditions. Showing recent visits within past 90 days and meeting all other requirements Today's Visits Date Type Provider Dept  09/17/21 Office Visit Milinda Pointer, MD [facility]  Showing today's visits and meeting all other requirements Future Appointments No visits were found meeting these conditions. Showing future appointments within next 90 days and meeting all other requirements I discussed the assessment and treatment plan with the patient. The patient was provided an opportunity to ask questions and all were answered. The patient agreed with the plan and demonstrated an understanding of the instructions.  Patient advised to call back or seek an in-person evaluation if the symptoms or condition worsens.  Duration of encounter: 30 minutes.  Note by: Elizabeth Cola, MD Date: 09/17/2021; Time: 3:23 PM

## 2021-09-17 ENCOUNTER — Ambulatory Visit: Payer: Medicaid Other | Attending: Pain Medicine | Admitting: Pain Medicine

## 2021-09-17 ENCOUNTER — Other Ambulatory Visit: Payer: Self-pay

## 2021-09-17 ENCOUNTER — Encounter: Payer: Self-pay | Admitting: Pain Medicine

## 2021-09-17 DIAGNOSIS — M79604 Pain in right leg: Secondary | ICD-10-CM | POA: Insufficient documentation

## 2021-09-17 DIAGNOSIS — Z969 Presence of functional implant, unspecified: Secondary | ICD-10-CM | POA: Diagnosis present

## 2021-09-17 DIAGNOSIS — G894 Chronic pain syndrome: Secondary | ICD-10-CM

## 2021-09-17 DIAGNOSIS — G90513 Complex regional pain syndrome I of upper limb, bilateral: Secondary | ICD-10-CM | POA: Diagnosis present

## 2021-09-17 DIAGNOSIS — Z79891 Long term (current) use of opiate analgesic: Secondary | ICD-10-CM

## 2021-09-17 DIAGNOSIS — G8929 Other chronic pain: Secondary | ICD-10-CM

## 2021-09-17 DIAGNOSIS — Z79899 Other long term (current) drug therapy: Secondary | ICD-10-CM | POA: Diagnosis present

## 2021-09-17 DIAGNOSIS — M79605 Pain in left leg: Secondary | ICD-10-CM | POA: Diagnosis present

## 2021-09-17 DIAGNOSIS — G90523 Complex regional pain syndrome I of lower limb, bilateral: Secondary | ICD-10-CM

## 2021-09-17 MED ORDER — HYDROCODONE-ACETAMINOPHEN 5-325 MG PO TABS: 1.0000 | ORAL_TABLET | Freq: Four times a day (QID) | ORAL | 0 refills | Status: DC | PRN

## 2021-09-17 NOTE — Patient Instructions (Signed)

## 2021-09-17 NOTE — Progress Notes (Signed)
Nursing Pain Medication Assessment:  Safety precautions to be maintained throughout the outpatient stay will include: orient to surroundings, keep bed in low position, maintain call bell within reach at all times, provide assistance with transfer out of bed and ambulation.  Medication Inspection Compliance: Pill count conducted under aseptic conditions, in front of the patient. Neither the pills nor the bottle was removed from the patient's sight at any time. Once count was completed pills were immediately returned to the patient in their original bottle.  Medication: Hydrocodone/APAP Pill/Patch Count:  5 of 120 pills remain Pill/Patch Appearance: Markings consistent with prescribed medication Bottle Appearance: Standard pharmacy container. Clearly labeled. Filled Date: 55 / 09 / 2022 Last Medication intake:  Today

## 2021-12-08 NOTE — Progress Notes (Signed)
PROVIDER NOTE: Information contained herein reflects review and annotations entered in association with encounter. Interpretation of such information and data should be left to medically-trained personnel. Information provided to patient can be located elsewhere in the medical record under "Patient Instructions". Document created using STT-dictation technology, any transcriptional errors that may result from process are unintentional.    Patient: Elizabeth [name]  Service Category: E/M  Provider: Gaspar Cola, MD  DOB: 11/21/1960  DOS: 12/10/2021  Specialty: Interventional Pain Management  MRN: [id number]  Setting: Ambulatory outpatient  PCP: Bunnie Pion, FNP  Type: Established Patient    Referring Provider: Bunnie Pion, FNP  Location: Office  Delivery: Face-to-face     HPI  Elizabeth [name], a 61 y.o. year old female, is here today because of her Chronic pain syndrome [G89.4]. Elizabeth [name]'s primary complain today is Arm Pain Last encounter: My last encounter with her was on 09/17/2021. Pertinent problems: Elizabeth [name] has Chronic low back pain (Bilateral) w/o sciatica; Chronic pain syndrome; Presence of spinal cord stimulator; Chronic lower extremity pain (1ry area of Pain) (Bilateral); Lumbar facet syndrome (Bilateral); Lumbar spondylosis; Complex regional pain syndrome type 1 of upper extremities (Bilateral); and Complex regional pain syndrome type 1 of lower extremities (Bilateral) on their pertinent problem list. Pain Assessment: Severity of Chronic pain is reported as a 3 /10. Location: Arm Right, Left, Lower/pain radiatiies everywhere. Onset: 1 to 4 weeks ago. Quality: Aching, Burning, Constant, Throbbing. Timing: Constant. Modifying factor(s): meds. Vitals:  height is _0  (1.448 m) and weight is 130 lb (59 kg). Her temperature is 98.2 F (36.8 C). Her blood pressure is 128/75 and her pulse is 86. Her oxygen saturation is 99%.   Reason for encounter: medication  management.   The patient indicates doing well with the current medication regimen. No adverse reactions or side effects reported to the medications.   RTCB: 03/18/2022  Pharmacotherapy Assessment  Analgesic: Oxycodone IR 5 mg, 1 tab PO q 6 hrs (20 mg/day of oxycodone) MME/day: 30 mg/day.   Monitoring: [place] PMP: PDMP reviewed during this encounter.       Pharmacotherapy: No side-effects or adverse reactions reported. Compliance: No problems identified. Effectiveness: Clinically acceptable.  Chauncey Fischer, RN  12/10/2021  2:37 PM  Sign when Signing Visit Nursing Pain Medication Assessment:  Safety precautions to be maintained throughout the outpatient stay will include: orient to surroundings, keep bed in low position, maintain call bell within reach at all times, provide assistance with transfer out of bed and ambulation.  Medication Inspection Compliance: Pill count conducted under aseptic conditions, in front of the patient. Neither the pills nor the bottle was removed from the patient's sight at any time. Once count was completed pills were immediately returned to the patient in their original bottle.  Medication: Hydrocodone/APAP Pill/Patch Count:  36 of 120 pills remain Pill/Patch Appearance: Markings consistent with prescribed medication Bottle Appearance: Standard pharmacy container. Clearly labeled. Filled Date: 2 / 7 / 2023 Last Medication intake:  TodaySafety precautions to be maintained throughout the outpatient stay will include: orient to surroundings, keep bed in low position, maintain call bell within reach at all times, provide assistance with transfer out of bed and ambulation.     UDS:  Summary  Date Value Ref Range Status  02/19/2021 Note  Final    Comment:    ==================================================================== [facility] (MW) ==================================================================== Test  Result        Flag       Units  Drug Present and Declared for Prescription Verification   Hydrocodone                    4515         EXPECTED   ng/mg creat   Hydromorphone                  325          EXPECTED   ng/mg creat   Dihydrocodeine                 467          EXPECTED   ng/mg creat   Norhydrocodone                 >2591        EXPECTED   ng/mg creat    Sources of hydrocodone include scheduled prescription medications.    Hydromorphone, dihydrocodeine and norhydrocodone are expected    metabolites of hydrocodone. Hydromorphone and dihydrocodeine are    also available as scheduled prescription medications.  ==================================================================== Test                      Result    Flag   Units      Ref Range   Creatinine              193              mg/dL      >=20 ==================================================================== Declared Medications:  The flagging and interpretation on this report are based on the  following declared medications.  Unexpected results may arise from  inaccuracies in the declared medications.   **Note: The testing scope of this panel includes these medications:   Hydrocodone (Norco)   **Note: The testing scope of this panel does not include the  following reported medications:   Acetaminophen (Norco)  Albuterol (Ventolin HFA)  Albuterol (Duoneb)  Atorvastatin (Lipitor)  Azithromycin (Zithromax)  Carvedilol (Coreg)  Cetirizine (Zyrtec)  Formoterol (Dulera)  Furosemide (Lasix)  Ipratropium (Duoneb)  Mometasone (Dulera)  Omeprazole (Prilosec)  Polyethylene Glycol (MiraLAX)  Potassium (Klor-Con)  Prednisone (Deltasone)  Ramelteon (Rozerem)  Spironolactone (Aldactone) ==================================================================== For clinical consultation, please call [phone]. ====================================================================      ROS  Constitutional: Denies any fever or  chills Gastrointestinal: No reported hemesis, hematochezia, vomiting, or acute GI distress Musculoskeletal: Denies any acute onset joint swelling, redness, loss of ROM, or weakness Neurological: No reported episodes of acute onset apraxia, aphasia, dysarthria, agnosia, amnesia, paralysis, loss of coordination, or loss of consciousness  Medication Review  HYDROcodone-acetaminophen, albuterol, atorvastatin, carvedilol, furosemide, hydrOXYzine, ipratropium-albuterol, mometasone-formoterol, potassium chloride SA, and spironolactone  History Review  Allergy: Ms. Eppard is allergic to lisinopril. Drug: Ms. Fuquay  reports no history of drug use. Alcohol:  reports no history of alcohol use. Tobacco:  reports that she quit smoking about 3 years ago. Her smoking use included cigarettes. She has a 60.00 pack-year smoking history. She has never used smokeless tobacco. Social: Ms. Mansour  reports that she quit smoking about 3 years ago. Her smoking use included cigarettes. She has a 60.00 pack-year smoking history. She has never used smokeless tobacco. She reports that she does not drink alcohol and does not use drugs. Medical:  has a past medical history of Anxiety, Arm pain (07/25/2015), CHF (congestive heart failure) ([place]), Congestive heart failure ([place]) (07/25/2015),  COPD (chronic obstructive pulmonary disease) ([place]), Depression, Hypertension, Lower extremity pain (07/25/2015), Reflux, RSD (reflex sympathetic dystrophy), Spinal cord stimulator status, Vitamin B12 deficiency, and Vitamin D deficiency. Surgical: Ms. Perrone  has a past surgical history that includes Spinal cord stimulator implant. Family: family history includes Cancer in her father; Cirrhosis in her mother.  Laboratory Chemistry Profile   Renal Lab Results  Component Value Date   BUN 20 04/22/2021   CREATININE 0.62 04/22/2021   BCR 17 08/30/2017   GFRAA >60 08/18/2018   GFRNONAA >60 04/22/2021    Hepatic Lab Results   Component Value Date   AST 25 01/03/2021   ALT 20 01/03/2021   ALBUMIN 4.1 01/03/2021   ALKPHOS 82 01/03/2021    Electrolytes Lab Results  Component Value Date   NA 140 04/22/2021   K 3.8 04/22/2021   CL 104 04/22/2021   CALCIUM 8.6 (L) 04/22/2021   MG 2.5 (H) 04/22/2021   PHOS 3.4 04/20/2021    Bone Lab Results  Component Value Date   VD25OH 25.3 (L) 03/18/2016   VD125OH2TOT 76.2 03/18/2016   25OHVITD1 37 08/30/2017   25OHVITD2 1.0 08/30/2017   25OHVITD3 36 08/30/2017    Inflammation (CRP: Acute Phase) (ESR: Chronic Phase) Lab Results  Component Value Date   CRP 4.4 08/30/2017   ESRSEDRATE 30 08/30/2017         Note: Above Lab results reviewed.  Recent Imaging Review  DG Chest Portable 1 View CLINICAL DATA:  Shortness of breath.  EXAM: PORTABLE CHEST 1 VIEW  COMPARISON:  01/03/2021  FINDINGS: The lungs are clear without focal pneumonia, edema, pneumothorax or pleural effusion. Interstitial markings are diffusely coarsened with chronic features. Cardiopericardial silhouette is at upper limits of normal for size. The visualized bony structures of the thorax show no acute abnormality. Cervicothoracic spinal stimulator leads again noted. Telemetry leads overlie the chest.  IMPRESSION: Chronic interstitial coarsening.  No active disease.  Electronically Signed   By: Misty Stanley M.D.   On: 04/17/2021 18:32 Note: Reviewed        Physical Exam  General appearance: Well nourished, well developed, and well hydrated. In no apparent acute distress Mental status: Alert, oriented x 3 (person, place, & time)       Respiratory: No evidence of acute respiratory distress Eyes: PERLA Vitals: BP 128/75    Pulse 86    Temp 98.2 F (36.8 C)    Ht _0  (1.448 m)    Wt 130 lb (59 kg)    SpO2 99%    BMI 28.13 kg/m  BMI: Estimated body mass index is 28.13 kg/m as calculated from the following:   Height as of this encounter: _1  (1.448 m).   Weight as of this  encounter: 130 lb (59 kg). Ideal: Patient must be at least 60 in tall to calculate ideal body weight  Assessment   Status Diagnosis  Controlled Controlled Controlled 1. Chronic pain syndrome   2. Chronic lower extremity pain (1ry area of Pain) (Bilateral)   3. Complex regional pain syndrome type 1 of upper extremities (Bilateral)   4. Complex regional pain syndrome type 1 of lower extremities (Bilateral)   5. Presence of spinal cord stimulator   6. Pharmacologic therapy   7. Chronic use of opiate for therapeutic purpose   8. Encounter for medication management   9. Encounter for chronic pain management      Updated Problems: No problems updated.  Plan of Care  Problem-specific:  No problem-specific Assessment &  Plan notes found for this encounter.  Ms. Delfina Schreurs Satz has a current medication list which includes the following long-term medication(s): albuterol, atorvastatin, carvedilol, furosemide, [START ON 12/18/2021] hydrocodone-acetaminophen, [START ON 01/17/2022] hydrocodone-acetaminophen, [START ON 02/16/2022] hydrocodone-acetaminophen, ipratropium-albuterol, potassium chloride sa, mometasone-formoterol, and spironolactone.  Pharmacotherapy (Medications Ordered): Meds ordered this encounter  Medications   HYDROcodone-acetaminophen (NORCO/VICODIN) 5-325 MG tablet    Sig: Take 1 tablet by mouth every 6 (six) hours as needed for severe pain. Must last 30 days.    Dispense:  120 tablet    Refill:  0    DO NOT: delete (not duplicate); no partial-fill (will deny script to complete), no refill request (F/U required). DISPENSE: 1 day early if closed on fill date. WARN: No CNS-depressants within 8 hrs of med.   HYDROcodone-acetaminophen (NORCO/VICODIN) 5-325 MG tablet    Sig: Take 1 tablet by mouth every 6 (six) hours as needed for severe pain. Must last 30 days.    Dispense:  120 tablet    Refill:  0    DO NOT: delete (not duplicate); no partial-fill (will deny script to  complete), no refill request (F/U required). DISPENSE: 1 day early if closed on fill date. WARN: No CNS-depressants within 8 hrs of med.   HYDROcodone-acetaminophen (NORCO/VICODIN) 5-325 MG tablet    Sig: Take 1 tablet by mouth every 6 (six) hours as needed for severe pain. Must last 30 days.    Dispense:  120 tablet    Refill:  0    DO NOT: delete (not duplicate); no partial-fill (will deny script to complete), no refill request (F/U required). DISPENSE: 1 day early if closed on fill date. WARN: No CNS-depressants within 8 hrs of med.   Orders:  No orders of the defined types were placed in this encounter.  Follow-up plan:   Return in about 14 weeks (around 03/18/2022) for Eval-day (M,W), (F2F), (MM).     Interventional Therapies  Risk   Complexity Considerations:   Estimated body mass index is 28.13 kg/m as calculated from the following:   Height as of this encounter: _0  (1.448 m).   Weight as of this encounter: 130 lb (59 kg). WNL   Planned   Pending:      Under consideration:   Diagnostic bilateral lumbar facet MBB  Possible bilateral lumbar sympathetic RFA    Completed:   Therapeutic right stellate ganglion Blk (SGB) (Multiple)  Therapeutic left stellate ganglion Blk (SGB) (Multiple)  Therapeutic right lumbar sympathetic Blk (LSB) (Multiple)  Hospital admission for therapeutic cervical epidural local anesthetic infusion (Multiple)  Hospital admission for therapeutic Lumbar epidural local anesthetic infusion (Multiple)    Therapeutic   Palliative (PRN) options:   Palliative SGB  Palliative  LSB     Recent Visits Date Type Provider Dept  09/17/21 Office Visit Milinda Pointer, MD [facility]  Showing recent visits within past 90 days and meeting all other requirements Today's Visits Date Type Provider Dept  12/10/21 Office Visit Milinda Pointer, MD [facility]  Showing today's visits and meeting all other requirements Future Appointments No  visits were found meeting these conditions. Showing future appointments within next 90 days and meeting all other requirements  I discussed the assessment and treatment plan with the patient. The patient was provided an opportunity to ask questions and all were answered. The patient agreed with the plan and demonstrated an understanding of the instructions.  Patient advised to call back or seek an in-person evaluation if the symptoms or  condition worsens.  Duration of encounter: 30 minutes.  Note by: Gaspar Cola, MD Date: 12/10/2021; Time: 2:46 PM

## 2021-12-10 ENCOUNTER — Encounter: Payer: Self-pay | Admitting: Pain Medicine

## 2021-12-10 ENCOUNTER — Other Ambulatory Visit: Payer: Self-pay

## 2021-12-10 ENCOUNTER — Ambulatory Visit: Payer: Medicaid Other | Attending: Pain Medicine | Admitting: Pain Medicine

## 2021-12-10 VITALS — BP 128/75 | HR 86 | Temp 98.2°F | Ht <= 58 in | Wt 130.0 lb

## 2021-12-10 DIAGNOSIS — G894 Chronic pain syndrome: Secondary | ICD-10-CM

## 2021-12-10 DIAGNOSIS — M79605 Pain in left leg: Secondary | ICD-10-CM

## 2021-12-10 DIAGNOSIS — G8929 Other chronic pain: Secondary | ICD-10-CM

## 2021-12-10 DIAGNOSIS — Z79899 Other long term (current) drug therapy: Secondary | ICD-10-CM

## 2021-12-10 DIAGNOSIS — M79604 Pain in right leg: Secondary | ICD-10-CM | POA: Diagnosis not present

## 2021-12-10 DIAGNOSIS — Z79891 Long term (current) use of opiate analgesic: Secondary | ICD-10-CM

## 2021-12-10 DIAGNOSIS — G90523 Complex regional pain syndrome I of lower limb, bilateral: Secondary | ICD-10-CM

## 2021-12-10 DIAGNOSIS — G90513 Complex regional pain syndrome I of upper limb, bilateral: Secondary | ICD-10-CM | POA: Diagnosis not present

## 2021-12-10 DIAGNOSIS — Z969 Presence of functional implant, unspecified: Secondary | ICD-10-CM

## 2021-12-10 MED ORDER — HYDROCODONE-ACETAMINOPHEN 5-325 MG PO TABS: 1.0000 | ORAL_TABLET | Freq: Four times a day (QID) | ORAL | 0 refills | Status: DC | PRN

## 2021-12-10 NOTE — Progress Notes (Signed)
Nursing Pain Medication Assessment:  ?Safety precautions to be maintained throughout the outpatient stay will include: orient to surroundings, keep bed in low position, maintain call bell within reach at all times, provide assistance with transfer out of bed and ambulation.  ?Medication Inspection Compliance: Pill count conducted under aseptic conditions, in front of the patient. Neither the pills nor the bottle was removed from the patient's sight at any time. Once count was completed pills were immediately returned to the patient in their original bottle. ? ?Medication: Hydrocodone/APAP ?Pill/Patch Count:  36 of 120 pills remain ?Pill/Patch Appearance: Markings consistent with prescribed medication ?Bottle Appearance: Standard pharmacy container. Clearly labeled. ?Filled Date: 2 / 7 / 2023 ?Last Medication intake:  TodaySafety precautions to be maintained throughout the outpatient stay will include: orient to surroundings, keep bed in low position, maintain call bell within reach at all times, provide assistance with transfer out of bed and ambulation.  ?

## 2021-12-10 NOTE — Patient Instructions (Signed)
____________________________________________________________________________________________ ° °Medication Rules ° °Purpose: To inform patients, and their family members, of our rules and regulations. ° °Applies to: All patients receiving prescriptions (written or electronic). ° °Pharmacy of record: Pharmacy where electronic prescriptions will be sent. If written prescriptions are taken to a different pharmacy, please inform the nursing staff. The pharmacy listed in the electronic medical record should be the one where you would like electronic prescriptions to be sent. ° °Electronic prescriptions: In compliance with the [place] [employer] Opioid Misuse Prevention (STOP) Act of 2017 (Session Law 2017-74[place]H243), effective October 12, 2018, all controlled substances must be electronically prescribed. Calling prescriptions to the pharmacy will cease to exist. ° °Prescription refills: Only during scheduled appointments. Applies to all prescriptions. ° °NOTE: The following applies primarily to controlled substances (Opioid* Pain Medications).  ° °Type of encounter (visit): For patients receiving controlled substances, face-to-face visits are required. (Not an option or up to the patient.) ° °Patient's responsibilities: °Pain Pills: Bring all pain pills to every appointment (except for procedure appointments). °Pill Bottles: Bring pills in original pharmacy bottle. Always bring the newest bottle. Bring bottle, even if empty. °Medication refills: You are responsible for knowing and keeping track of what medications you take and those you need refilled. °The day before your appointment: write a list of all prescriptions that need to be refilled. °The day of the appointment: give the list to the admitting nurse. Prescriptions will be written only during appointments. No prescriptions will be written on procedure days. °If you forget a medication: it will not be "Called in", "Faxed", or "electronically sent". You will  need to get another appointment to get these prescribed. °No early refills. Do not call asking to have your prescription filled early. °Prescription Accuracy: You are responsible for carefully inspecting your prescriptions before leaving our office. Have the discharge nurse carefully go over each prescription with you, before taking them home. Make sure that your name is accurately spelled, that your address is correct. Check the name and dose of your medication to make sure it is accurate. Check the number of pills, and the written instructions to make sure they are clear and accurate. Make sure that you are given enough medication to last until your next medication refill appointment. °Taking Medication: Take medication as prescribed. When it comes to controlled substances, taking less pills or less frequently than prescribed is permitted and encouraged. °Never take more pills than instructed. °Never take medication more frequently than prescribed.  °Inform other Doctors: Always inform, all of your healthcare providers, of all the medications you take. °Pain Medication from other Providers: You are not allowed to accept any additional pain medication from any other Doctor or Healthcare provider. There are two exceptions to this rule. (see below) In the event that you require additional pain medication, you are responsible for notifying us, as stated below. °Cough Medicine: Often these contain an opioid, such as codeine or hydrocodone. Never accept or take cough medicine containing these opioids if you are already taking an opioid* medication. The combination may cause respiratory failure and death. °Medication Agreement: You are responsible for carefully reading and following our Medication Agreement. This must be signed before receiving any prescriptions from our practice. Safely store a copy of your signed Agreement. Violations to the Agreement will result in no further prescriptions. (Additional copies of our  Medication Agreement are available upon request.) °Laws, Rules, & Regulations: All patients are expected to follow all Federal and State Laws, Statutes, Rules, & Regulations. Ignorance of   the Laws does not constitute a valid excuse.  °Illegal drugs and Controlled Substances: The use of illegal substances (including, but not limited to marijuana and its derivatives) and/or the illegal use of any controlled substances is strictly prohibited. Violation of this rule may result in the immediate and permanent discontinuation of any and all prescriptions being written by our practice. The use of any illegal substances is prohibited. °Adopted CDC guidelines & recommendations: Target dosing levels will be at or below 60 MME/day. Use of benzodiazepines** is not recommended. ° °Exceptions: There are only two exceptions to the rule of not receiving pain medications from other Healthcare Providers. °Exception #1 (Emergencies): In the event of an emergency (i.e.: accident requiring emergency care), you are allowed to receive additional pain medication. However, you are responsible for: As soon as you are able, call our office [phone], at any time of the day or night, and leave a message stating your name, the date and nature of the emergency, and the name and dose of the medication prescribed. In the event that your call is answered by a member of our staff, make sure to document and save the date, time, and the name of the person that took your information.  °Exception #2 (Planned Surgery): In the event that you are scheduled by another doctor or dentist to have any type of surgery or procedure, you are allowed (for a period no longer than 30 days), to receive additional pain medication, for the acute post-op pain. However, in this case, you are responsible for picking up a copy of our "Post-op Pain Management for Surgeons" handout, and giving it to your surgeon or dentist. This document is available at our office, and  does not require an appointment to obtain it. Simply go to our office during business hours (Monday-Thursday from 8:00 AM to 4:00 PM) (Friday 8:00 AM to 12:00 Noon) or if you have a scheduled appointment with us, prior to your surgery, and ask for it by name. In addition, you are responsible for: calling our office [phone], at any time of the day or night, and leaving a message stating your name, name of your surgeon, type of surgery, and date of procedure or surgery. Failure to comply with your responsibilities may result in termination of therapy involving the controlled substances. °Medication Agreement Violation. Following the above rules, including your responsibilities will help you in avoiding a Medication Agreement Violation (“Breaking your Pain Medication Contract”). ° °*Opioid medications include: morphine, codeine, oxycodone, oxymorphone, hydrocodone, hydromorphone, meperidine, tramadol, tapentadol, buprenorphine, fentanyl, methadone. °**Benzodiazepine medications include: diazepam (Valium), alprazolam (Xanax), clonazepam (Klonopine), lorazepam (Ativan), clorazepate (Tranxene), chlordiazepoxide (Librium), estazolam (Prosom), oxazepam (Serax), temazepam (Restoril), triazolam (Halcion) °(Last updated: 07/09/2021) °____________________________________________________________________________________________ ° ____________________________________________________________________________________________ ° °Medication Recommendations and Reminders ° °Applies to: All patients receiving prescriptions (written and/or electronic). ° °Medication Rules & Regulations: These rules and regulations exist for your safety and that of others. They are not flexible and neither are we. Dismissing or ignoring them will be considered "non-compliance" with medication therapy, resulting in complete and irreversible termination of such therapy. (See document titled "Medication Rules" for more details.) In all conscience,  because of safety reasons, we cannot continue providing a therapy where the patient does not follow instructions. ° °Pharmacy of record:  °Definition: This is the pharmacy where your electronic prescriptions will be sent.  °We do not endorse any particular pharmacy, however, we have experienced problems with [facility] not securing enough medication supply for the community. °We do not restrict you   in your choice of pharmacy. However, once we write for your prescriptions, we will NOT be re-sending more prescriptions to fix restricted supply problems created by your pharmacy, or your insurance.  °The pharmacy listed in the electronic medical record should be the one where you want electronic prescriptions to be sent. °If you choose to change pharmacy, simply notify our nursing staff. ° °Recommendations: °Keep all of your pain medications in a safe place, under lock and key, even if you live alone. We will NOT replace lost, stolen, or damaged medication. °After you fill your prescription, take 1 week's worth of pills and put them away in a safe place. You should keep a separate, properly labeled bottle for this purpose. The remainder should be kept in the original bottle. Use this as your primary supply, until it runs out. Once it's gone, then you know that you have 1 week's worth of medicine, and it is time to come in for a prescription refill. If you do this correctly, it is unlikely that you will ever run out of medicine. °To make sure that the above recommendation works, it is very important that you make sure your medication refill appointments are scheduled at least 1 week before you run out of medicine. To do this in an effective manner, make sure that you do not leave the office without scheduling your next medication management appointment. Always ask the nursing staff to show you in your prescription , when your medication will be running out. Then arrange for the receptionist to get you a return appointment,  at least 7 days before you run out of medicine. Do not wait until you have 1 or 2 pills left, to come in. This is very poor planning and does not take into consideration that we may need to cancel appointments due to bad weather, sickness, or emergencies affecting our staff. °DO NOT ACCEPT A "Partial Fill": If for any reason your pharmacy does not have enough pills/tablets to completely fill or refill your prescription, do not allow for a "partial fill". The law allows the pharmacy to complete that prescription within 72 hours, without requiring a new prescription. If they do not fill the rest of your prescription within those 72 hours, you will need a separate prescription to fill the remaining amount, which we will NOT provide. If the reason for the partial fill is your insurance, you will need to talk to the pharmacist about payment alternatives for the remaining tablets, but again, DO NOT ACCEPT A PARTIAL FILL, unless you can trust your pharmacist to obtain the remainder of the pills within 72 hours. ° °Prescription refills and/or changes in medication(s):  °Prescription refills, and/or changes in dose or medication, will be conducted only during scheduled medication management appointments. (Applies to both, written and electronic prescriptions.) °No refills on procedure days. No medication will be changed or started on procedure days. No changes, adjustments, and/or refills will be conducted on a procedure day. Doing so will interfere with the diagnostic portion of the procedure. °No phone refills. No medications will be "called into the pharmacy". °No Fax refills. °No weekend refills. °No Holliday refills. °No after hours refills. ° °Remember:  °Business hours are:  °Monday to Thursday 8:00 AM to 4:00 PM °Provider's Schedule: °Kaz Auld, MD - Appointments are:  °Medication management: Monday and Wednesday 8:00 AM to 4:00 PM °Procedure day: Tuesday and Thursday 7:30 AM to 4:00 PM °Bilal Lateef, MD -  Appointments are:  °Medication management: Tuesday and Thursday 8:00   AM to 4:00 PM °Procedure day: Monday and Wednesday 7:30 AM to 4:00 PM °(Last update: 05/01/2020) °____________________________________________________________________________________________ ° ____________________________________________________________________________________________ ° °CBD (cannabidiol) & Delta-8 (Delta-8 tetrahydrocannabinol) WARNING ° °Intro: Cannabidiol (CBD) and tetrahydrocannabinol (THC), are two natural compounds found in plants of the Cannabis genus. They can both be extracted from hemp or cannabis. Hemp and cannabis come from the Cannabis sativa plant. Both compounds interact with your body’s endocannabinoid system, but they have very different effects. CBD does not produce the high sensation associated with cannabis. Delta-8 tetrahydrocannabinol, also known as delta-8 THC, is a psychoactive substance found in the Cannabis sativa plant, of which marijuana and hemp are two varieties. THC is responsible for the high associated with the illicit use of marijuana. ° °Applicable to: All individuals currently taking or considering taking CBD (cannabidiol) and, more important, all patients taking opioid analgesic controlled substances (pain medication). (Example: oxycodone; oxymorphone; hydrocodone; hydromorphone; morphine; methadone; tramadol; tapentadol; fentanyl; buprenorphine; butorphanol; dextromethorphan; meperidine; codeine; etc.) ° °Legal status: CBD remains a Schedule I drug prohibited for any use. CBD is illegal with one exception. In the [place], CBD has a limited Food and Drug Administration (FDA) approval for the treatment of two specific types of epilepsy disorders. Only one CBD product has been approved by the FDA for this purpose: "Epidiolex". FDA is aware that some companies are marketing products containing cannabis and cannabis-derived compounds in ways that violate the Federal Food, Drug and Cosmetic Act  (FD&C Act) and that may put the health and safety of consumers at risk. The FDA, a Federal agency, has not enforced the CBD status since 2018. UPDATE: (11/28/2021) The Drug Enforcement Agency (DEA) issued a letter stating that "delta" cannabinoids, including Delta-8-THCO and Delta-9-THCO, synthetically derived from hemp do not qualify as hemp and will be viewed as Schedule I drugs. (Schedule I drugs, substances, or chemicals are defined as drugs with no currently accepted medical use and a high potential for abuse. Some examples of Schedule I drugs are: heroin, lysergic acid diethylamide (LSD), marijuana (cannabis), 3,4-methylenedioxymethamphetamine (ecstasy), methaqualone, and peyote.) (https://www.dea.gov) ° °Legality: Some manufacturers ship CBD products nationally, which is illegal. Often such products are sold online and are therefore available throughout the country. CBD is openly sold in head shops and health food stores in some states where such sales have not been explicitly legalized. Selling unapproved products with unsubstantiated therapeutic claims is not only a violation of the law, but also can put patients at risk, as these products have not been proven to be safe or effective. Federal illegality makes it difficult to conduct research on CBD. ° °Reference: "FDA Regulation of Cannabis and Cannabis-Derived Products, Including Cannabidiol (CBD)" - https://www.fda.gov/news-events/public-health-focus/fda-regulation-cannabis-and-cannabis-derived-products-including-cannabidiol-cbd ° °Warning: CBD is not FDA approved and has not undergo the same manufacturing controls as prescription drugs.  This means that the purity and safety of available CBD may be questionable. Most of the time, despite manufacturer's claims, it is contaminated with THC (delta-9-tetrahydrocannabinol - the chemical in marijuana responsible for the "HIGH").  When this is the case, the THC contaminant will trigger a positive urine drug  screen (UDS) test for Marijuana (carboxy-THC). Because a positive UDS for any illicit substance is a violation of our medication agreement, your opioid analgesics (pain medicine) may be permanently discontinued. °The FDA recently put out a warning about 5 things that everyone should be aware of regarding Delta-8 THC: °Delta-8 THC products have not been evaluated or approved by the FDA for safe use and may be marketed in ways that put the   public health at risk. °The FDA has received adverse event reports involving delta-8 THC-containing products. °Delta-8 THC has psychoactive and intoxicating effects. °Delta-8 THC manufacturing often involve use of potentially harmful chemicals to create the concentrations of delta-8 THC claimed in the marketplace. The final delta-8 THC product may have potentially harmful by-products (contaminants) due to the chemicals used in the process. Manufacturing of delta-8 THC products may occur in uncontrolled or unsanitary settings, which may lead to the presence of unsafe contaminants or other potentially harmful substances. °Delta-8 THC products should be kept out of the reach of children and pets. ° °MORE ABOUT CBD ° °General Information: CBD was discovered in 1940 and it is a derivative of the cannabis sativa genus plants (Marijuana and Hemp). It is one of the 113 identified substances found in Marijuana. It accounts for up to 40% of the plant's extract. As of 2018, preliminary clinical studies on CBD included research for the treatment of anxiety, movement disorders, and pain. CBD is available and consumed in multiple forms, including inhalation of smoke or vapor, as an aerosol spray, and by mouth. It may be supplied as an oil containing CBD, capsules, dried cannabis, or as a liquid solution. CBD is thought not to be as psychoactive as THC (delta-9-tetrahydrocannabinol - the chemical in marijuana responsible for the "HIGH"). Studies suggest that CBD may interact with different  biological target receptors in the body, including cannabinoid and other neurotransmitter receptors. As of 2018 the mechanism of action for its biological effects has not been determined. ° °Side-effects   Adverse reactions: Dry mouth, diarrhea, decreased appetite, fatigue, drowsiness, malaise, weakness, sleep disturbances, and others. ° °Drug interactions: CBC may interact with other medications such as blood-thinners. Because CBD causes drowsiness on its own, it also increases the drowsiness caused by other medications, including antihistamines (such as Benadryl), benzodiazepines (Xanax, Ativan, Valium), antipsychotics, antidepressants and opioids, as well as alcohol and supplements such as kava, melatonin and St. John's Wort. Be cautious with the following combinations:  ° °Brivaracetam (Briviact) °Brivaracetam is changed and broken down by the body. CBD might decrease how quickly the body breaks down brivaracetam. This might increase levels of brivaracetam in the body. ° °Caffeine °Caffeine is changed and broken down by the body. CBD might decrease how quickly the body breaks down caffeine. This might increase levels of caffeine in the body. ° °Carbamazepine (Tegretol) °Carbamazepine is changed and broken down by the body. CBD might decrease how quickly the body breaks down carbamazepine. This might increase levels of carbamazepine in the body and increase its side effects. ° °Citalopram (Celexa) °Citalopram is changed and broken down by the body. CBD might decrease how quickly the body breaks down citalopram. This might increase levels of citalopram in the body and increase its side effects. ° °Clobazam (Onfi) °Clobazam is changed and broken down by the liver. CBD might decrease how quickly the liver breaks down clobazam. This might increase the effects and side effects of clobazam. ° °Eslicarbazepine (Aptiom) °Eslicarbazepine is changed and broken down by the body. CBD might decrease how quickly the body  breaks down eslicarbazepine. This might increase levels of eslicarbazepine in the body by a small amount. ° °Everolimus (Zostress) °Everolimus is changed and broken down by the body. CBD might decrease how quickly the body breaks down everolimus. This might increase levels of everolimus in the body. ° °Lithium °Taking higher doses of CBD might increase levels of lithium. This can increase the risk of lithium toxicity. ° °Medications changed by the   liver (Cytochrome P450 1A1 (CYP1A1) substrates) °Some medications are changed and broken down by the liver. CBD might change how quickly the liver breaks down these medications. This could change the effects and side effects of these medications. ° °Medications changed by the liver (Cytochrome P450 1A2 (CYP1A2) substrates) °Some medications are changed and broken down by the liver. CBD might change how quickly the liver breaks down these medications. This could change the effects and side effects of these medications. ° °Medications changed by the liver (Cytochrome P450 1B1 (CYP1B1) substrates) °Some medications are changed and broken down by the liver. CBD might change how quickly the liver breaks down these medications. This could change the effects and side effects of these medications. ° °Medications changed by the liver (Cytochrome P450 2A6 (CYP2A6) substrates) °Some medications are changed and broken down by the liver. CBD might change how quickly the liver breaks down these medications. This could change the effects and side effects of these medications. ° °Medications changed by the liver (Cytochrome P450 2B6 (CYP2B6) substrates) °Some medications are changed and broken down by the liver. CBD might change how quickly the liver breaks down these medications. This could change the effects and side effects of these medications. ° °Medications changed by the liver (Cytochrome P450 2C19 (CYP2C19) substrates) °Some medications are changed and broken down by the liver.  CBD might change how quickly the liver breaks down these medications. This could change the effects and side effects of these medications. ° °Medications changed by the liver (Cytochrome P450 2C8 (CYP2C8) substrates) °Some medications are changed and broken down by the liver. CBD might change how quickly the liver breaks down these medications. This could change the effects and side effects of these medications. ° °Medications changed by the liver (Cytochrome P450 2C9 (CYP2C9) substrates) °Some medications are changed and broken down by the liver. CBD might change how quickly the liver breaks down these medications. This could change the effects and side effects of these medications. ° °Medications changed by the liver (Cytochrome P450 2D6 (CYP2D6) substrates) °Some medications are changed and broken down by the liver. CBD might change how quickly the liver breaks down these medications. This could change the effects and side effects of these medications. ° °Medications changed by the liver (Cytochrome P450 2E1 (CYP2E1) substrates) °Some medications are changed and broken down by the liver. CBD might change how quickly the liver breaks down these medications. This could change the effects and side effects of these medications. ° °Medications changed by the liver (Cytochrome P450 3A4 (CYP3A4) substrates) °Some medications are changed and broken down by the liver. CBD might change how quickly the liver breaks down these medications. This could change the effects and side effects of these medications. ° °Medications changed by the liver (Glucuronidated drugs) °Some medications are changed and broken down by the liver. CBD might change how quickly the liver breaks down these medications. This could change the effects and side effects of these medications. ° °Medications that decrease the breakdown of other medications by the liver (Cytochrome P450 2C19 (CYP2C19) inhibitors) °CBD is changed and broken down by the liver.  Some drugs decrease how quickly the liver changes and breaks down CBD. This could change the effects and side effects of CBD. ° °Medications that decrease the breakdown of other medications in the liver (Cytochrome P450 3A4 (CYP3A4) inhibitors) °CBD is changed and broken down by the liver. Some drugs decrease how quickly the liver changes and breaks down CBD. This could change the   effects and side effects of CBD. ° °Medications that increase breakdown of other medications by the liver (Cytochrome P450 3A4 (CYP3A4) inducers) °CBD is changed and broken down by the liver. Some drugs increase how quickly the liver changes and breaks down CBD. This could change the effects and side effects of CBD. ° °Medications that increase the breakdown of other medications by the liver (Cytochrome P450 2C19 (CYP2C19) inducers) °CBD is changed and broken down by the liver. Some drugs increase how quickly the liver changes and breaks down CBD. This could change the effects and side effects of CBD. ° °Methadone (Dolophine) °Methadone is broken down by the liver. CBD might decrease how quickly the liver breaks down methadone. Taking cannabidiol along with methadone might increase the effects and side effects of methadone. ° °Rufinamide (Banzel) °Rufinamide is changed and broken down by the body. CBD might decrease how quickly the body breaks down rufinamide. This might increase levels of rufinamide in the body by a small amount. ° °Sedative medications (CNS depressants) °CBD might cause sleepiness and slowed breathing. Some medications, called sedatives, can also cause sleepiness and slowed breathing. Taking CBD with sedative medications might cause breathing problems and/or too much sleepiness. ° °Sirolimus (Rapamune) °Sirolimus is changed and broken down by the body. CBD might decrease how quickly the body breaks down sirolimus. This might increase levels of sirolimus in the body. ° °Stiripentol (Diacomit) °Stiripentol is changed and  broken down by the body. CBD might decrease how quickly the body breaks down stiripentol. This might increase levels of stiripentol in the body and increase its side effects. ° °Tacrolimus (Prograf) °Tacrolimus is changed and broken down by the body. CBD might decrease how quickly the body breaks down tacrolimus. This might increase levels of tacrolimus in the body. ° °Tamoxifen (Soltamox) °Tamoxifen is changed and broken down by the body. CBD might affect how quickly the body breaks down tamoxifen. This might affect levels of tamoxifen in the body. ° °Topiramate (Topamax) °Topiramate is changed and broken down by the body. CBD might decrease how quickly the body breaks down topiramate. This might increase levels of topiramate in the body by a small amount. ° °Valproate °Valproic acid can cause liver injury. Taking cannabidiol with valproic acid might increase the chance of liver injury. CBD and/or valproic acid might need to be stopped, or the dose might need to be reduced. ° °Warfarin (Coumadin) °CBD might increase levels of warfarin, which can increase the risk for bleeding. CBD and/or warfarin might need to be stopped, or the dose might need to be reduced. ° °Zonisamide °Zonisamide is changed and broken down by the body. CBD might decrease how quickly the body breaks down zonisamide. This might increase levels of zonisamide in the body by a small amount. °(Last update: 12/10/2021) °____________________________________________________________________________________________ ° ____________________________________________________________________________________________ ° °Drug Holidays (Slow) ° °What is a "Drug Holiday"? °Drug Holiday: is the name given to the period of time during which a patient stops taking a medication(s) for the purpose of eliminating tolerance to the drug. ° °Benefits °Improved effectiveness of opioids. °Decreased opioid dose needed to achieve benefits. °Improved pain with lesser  dose. ° °What is tolerance? °Tolerance: is the progressive decreased in effectiveness of a drug due to its repetitive use. With repetitive use, the body gets use to the medication and as a consequence, it loses its effectiveness. This is a common problem seen with opioid pain medications. As a result, a larger dose of the drug is needed to achieve the same effect   that used to be obtained with a smaller dose. ° °How long should a "Drug Holiday" last? °You should stay off of the pain medicine for at least 14 consecutive days. (2 weeks) ° °Should I stop the medicine "cold turkey"? °No. You should always coordinate with your Pain Specialist so that he/she can provide you with the correct medication dose to make the transition as smoothly as possible. ° °How do I stop the medicine? °Slowly. You will be instructed to decrease the daily amount of pills that you take by one (1) pill every seven (7) days. This is called a "slow downward taper" of your dose. For example: if you normally take four (4) pills per day, you will be asked to drop this dose to three (3) pills per day for seven (7) days, then to two (2) pills per day for seven (7) days, then to one (1) per day for seven (7) days, and at the end of those last seven (7) days, this is when the "Drug Holiday" would start.  ° °Will I have withdrawals? °By doing a "slow downward taper" like this one, it is unlikely that you will experience any significant withdrawal symptoms. Typically, what triggers withdrawals is the sudden stop of a high dose opioid therapy. Withdrawals can usually be avoided by slowly decreasing the dose over a prolonged period of time. If you do not follow these instructions and decide to stop your medication abruptly, withdrawals may be possible. ° °What are withdrawals? °Withdrawals: refers to the wide range of symptoms that occur after stopping or dramatically reducing opiate drugs after heavy and prolonged use. Withdrawal symptoms do not occur to  patients that use low dose opioids, or those who take the medication sporadically. Contrary to benzodiazepine (example: Valium, Xanax, etc.) or alcohol withdrawals (“Delirium Tremens”), opioid withdrawals are not lethal. Withdrawals are the physical manifestation of the body getting rid of the excess receptors. ° °Expected Symptoms °Early symptoms of withdrawal may include: °Agitation °Anxiety °Muscle aches °Increased tearing °Insomnia °Runny nose °Sweating °Yawning ° °Late symptoms of withdrawal may include: °Abdominal cramping °Diarrhea °Dilated pupils °Goose bumps °Nausea °Vomiting ° °Will I experience withdrawals? °Due to the slow nature of the taper, it is very unlikely that you will experience any. ° °What is a slow taper? °Taper: refers to the gradual decrease in dose.  °(Last update: 05/01/2020) °____________________________________________________________________________________________ ° °  °

## 2022-01-12 ENCOUNTER — Telehealth: Payer: Self-pay | Admitting: Pain Medicine

## 2022-01-12 NOTE — Telephone Encounter (Signed)
Spoke with patient and pharmacist,  informed both that patient may fill one day early, but must last until the date on the script. ?

## 2022-02-02 ENCOUNTER — Encounter: Payer: Self-pay | Admitting: *Deleted

## 2022-03-09 NOTE — Progress Notes (Unsigned)
PROVIDER NOTE: Information contained herein reflects review and annotations entered in association with encounter. Interpretation of such information and data should be left to medically-trained personnel. Information provided to patient can be located elsewhere in the medical record under "Patient Instructions". Document created using STT-dictation technology, any transcriptional errors that may result from process are unintentional.    Patient: Elizabeth [name]  Service Category: E/M  Provider: Gaspar Cola, MD  DOB: 13-Nov-1960  DOS: 03/11/2022  Specialty: Interventional Pain Management  MRN: [id number]  Setting: Ambulatory outpatient  PCP: Bunnie Pion, FNP  Type: Established Patient    Referring Provider: Bunnie Pion, FNP  Location: Office  Delivery: Face-to-face     HPI  Elizabeth [name], a 60 y.o. year old female, is here today because of her No primary diagnosis found.. Elizabeth [name] primary complain today is No chief complaint on file. Last encounter: My last encounter with her was on 01/12/2022. Pertinent problems: Elizabeth [name] has Chronic low back pain (Bilateral) w/o sciatica; Chronic pain syndrome; Presence of spinal cord stimulator; Chronic lower extremity pain (1ry area of Pain) (Bilateral); Lumbar facet syndrome (Bilateral); Lumbar spondylosis; Complex regional pain syndrome type 1 of upper extremities (Bilateral); and Complex regional pain syndrome type 1 of lower extremities (Bilateral) on their pertinent problem list. Pain Assessment: Severity of   is reported as a  /10. Location:    / . Onset:  . Quality:  . Timing:  . Modifying factor(s):  Marland Kitchen Vitals:  vitals were not taken for this visit.   Reason for encounter:  *** . ***  Pharmacotherapy Assessment  Analgesic: Oxycodone IR 5 mg, 1 tab PO q 6 hrs (20 mg/day of oxycodone) MME/day: 30 mg/day.   Monitoring: [place] PMP: PDMP reviewed during this encounter.       Pharmacotherapy: No side-effects or adverse  reactions reported. Compliance: No problems identified. Effectiveness: Clinically acceptable.  No notes on file  UDS:  Summary  Date Value Ref Range Status  02/19/2021 Note  Final    Comment:    ==================================================================== [facility] (MW) ==================================================================== Test                             Result       Flag       Units  Drug Present and Declared for Prescription Verification   Hydrocodone                    4515         EXPECTED   ng/mg creat   Hydromorphone                  325          EXPECTED   ng/mg creat   Dihydrocodeine                 467          EXPECTED   ng/mg creat   Norhydrocodone                 >2591        EXPECTED   ng/mg creat    Sources of hydrocodone include scheduled prescription medications.    Hydromorphone, dihydrocodeine and norhydrocodone are expected    metabolites of hydrocodone. Hydromorphone and dihydrocodeine are    also available as scheduled prescription medications.  ==================================================================== Test  Result    Flag   Units      Ref Range   Creatinine              193              mg/dL      >=20 ==================================================================== Declared Medications:  The flagging and interpretation on this report are based on the  following declared medications.  Unexpected results may arise from  inaccuracies in the declared medications.   **Note: The testing scope of this panel includes these medications:   Hydrocodone (Norco)   **Note: The testing scope of this panel does not include the  following reported medications:   Acetaminophen (Norco)  Albuterol (Ventolin HFA)  Albuterol (Duoneb)  Atorvastatin (Lipitor)  Azithromycin (Zithromax)  Carvedilol (Coreg)  Cetirizine (Zyrtec)  Formoterol (Dulera)  Furosemide (Lasix)  Ipratropium (Duoneb)  Mometasone  (Dulera)  Omeprazole (Prilosec)  Polyethylene Glycol (MiraLAX)  Potassium (Klor-Con)  Prednisone (Deltasone)  Ramelteon (Rozerem)  Spironolactone (Aldactone) ==================================================================== For clinical consultation, please call [phone]. ====================================================================      ROS  Constitutional: Denies any fever or chills Gastrointestinal: No reported hemesis, hematochezia, vomiting, or acute GI distress Musculoskeletal: Denies any acute onset joint swelling, redness, loss of ROM, or weakness Neurological: No reported episodes of acute onset apraxia, aphasia, dysarthria, agnosia, amnesia, paralysis, loss of coordination, or loss of consciousness  Medication Review  HYDROcodone-acetaminophen, albuterol, atorvastatin, carvedilol, furosemide, hydrOXYzine, ipratropium-albuterol, mometasone-formoterol, potassium chloride SA, and spironolactone  History Review  Allergy: Elizabeth [name] is allergic to lisinopril. Drug: Elizabeth [name]  reports no history of drug use. Alcohol:  reports no history of alcohol use. Tobacco:  reports that she quit smoking about 3 years ago. Her smoking use included cigarettes. She has a 60.00 pack-year smoking history. She has never used smokeless tobacco. Social: Elizabeth [name]  reports that she quit smoking about 3 years ago. Her smoking use included cigarettes. She has a 60.00 pack-year smoking history. She has never used smokeless tobacco. She reports that she does not drink alcohol and does not use drugs. Medical:  has a past medical history of Anxiety, Arm pain (07/25/2015), CHF (congestive heart failure) ([place]), Congestive heart failure ([place]) (07/25/2015), COPD (chronic obstructive pulmonary disease) ([place]), Depression, Hypertension, Lower extremity pain (07/25/2015), Reflux, RSD (reflex sympathetic dystrophy), Spinal cord stimulator status, Vitamin B12 deficiency, and Vitamin D  deficiency. Surgical: Elizabeth [name]  has a past surgical history that includes Spinal cord stimulator implant. Family: family history includes Cancer in her father; Cirrhosis in her mother.  Laboratory Chemistry Profile   Renal Lab Results  Component Value Date   BUN 20 04/22/2021   CREATININE 0.62 04/22/2021   BCR 17 08/30/2017   GFRAA >60 08/18/2018   GFRNONAA >60 04/22/2021    Hepatic Lab Results  Component Value Date   AST 25 01/03/2021   ALT 20 01/03/2021   ALBUMIN 4.1 01/03/2021   ALKPHOS 82 01/03/2021    Electrolytes Lab Results  Component Value Date   NA 140 04/22/2021   K 3.8 04/22/2021   CL 104 04/22/2021   CALCIUM 8.6 (L) 04/22/2021   MG 2.5 (H) 04/22/2021   PHOS 3.4 04/20/2021    Bone Lab Results  Component Value Date   VD25OH 25.3 (L) 03/18/2016   VD125OH2TOT 76.2 03/18/2016   25OHVITD1 37 08/30/2017   25OHVITD2 1.0 08/30/2017   25OHVITD3 36 08/30/2017    Inflammation (CRP: Acute Phase) (ESR: Chronic Phase) Lab Results  Component Value Date   CRP 4.4 08/30/2017  ESRSEDRATE 30 08/30/2017         Note: Above Lab results reviewed.  Recent Imaging Review  DG Chest Portable 1 View CLINICAL DATA:  Shortness of breath.  EXAM: PORTABLE CHEST 1 VIEW  COMPARISON:  01/03/2021  FINDINGS: The lungs are clear without focal pneumonia, edema, pneumothorax or pleural effusion. Interstitial markings are diffusely coarsened with chronic features. Cardiopericardial silhouette is at upper limits of normal for size. The visualized bony structures of the thorax show no acute abnormality. Cervicothoracic spinal stimulator leads again noted. Telemetry leads overlie the chest.  IMPRESSION: Chronic interstitial coarsening.  No active disease.  Electronically Signed   By: Misty Stanley M.D.   On: 04/17/2021 18:32 Note: Reviewed        Physical Exam  General appearance: Well nourished, well developed, and well hydrated. In no apparent acute distress Mental  status: Alert, oriented x 3 (person, place, & time)       Respiratory: No evidence of acute respiratory distress Eyes: PERLA Vitals: There were no vitals taken for this visit. BMI: Estimated body mass index is 28.13 kg/m as calculated from the following:   Height as of 12/10/21: 4\' 9"  (1.448 m).   Weight as of 12/10/21: 130 lb (59 kg). Ideal: Patient weight not recorded  Assessment   Diagnosis Status  No diagnosis found. Controlled Controlled Controlled   Updated Problems: No problems updated.  Plan of Care  Problem-specific:  No problem-specific Assessment & Plan notes found for this encounter.  Elizabeth [name] has a current medication list which includes the following long-term medication(s): albuterol, atorvastatin, carvedilol, furosemide, hydrocodone-acetaminophen, hydrocodone-acetaminophen, hydrocodone-acetaminophen, ipratropium-albuterol, mometasone-formoterol, potassium chloride sa, and spironolactone.  Pharmacotherapy (Medications Ordered): No orders of the defined types were placed in this encounter.  Orders:  No orders of the defined types were placed in this encounter.  Follow-up plan:   No follow-ups on file.     Interventional Therapies  Risk  Complexity Considerations:   Estimated body mass index is 28.13 kg/m as calculated from the following:   Height as of this encounter: 4\' 9"  (1.448 m).   Weight as of this encounter: 130 lb (59 kg). WNL   Planned  Pending:      Under consideration:   Diagnostic bilateral lumbar facet MBB  Possible bilateral lumbar sympathetic RFA    Completed:   Therapeutic right stellate ganglion Blk (SGB) (Multiple)  Therapeutic left stellate ganglion Blk (SGB) (Multiple)  Therapeutic right lumbar sympathetic Blk (LSB) (Multiple)  Hospital admission for therapeutic cervical epidural local anesthetic infusion (Multiple)  Hospital admission for therapeutic Lumbar epidural local anesthetic infusion (Multiple)     Therapeutic  Palliative (PRN) options:   Palliative SGB  Palliative  LSB      Recent Visits Date Type Provider Dept  12/10/21 Office Visit Milinda Pointer, MD [facility]  Showing recent visits within past 90 days and meeting all other requirements Future Appointments Date Type Provider Dept  03/11/22 Appointment Milinda Pointer, MD [facility]  Showing future appointments within next 90 days and meeting all other requirements  I discussed the assessment and treatment plan with the patient. The patient was provided an opportunity to ask questions and all were answered. The patient agreed with the plan and demonstrated an understanding of the instructions.  Patient advised to call back or seek an in-person evaluation if the symptoms or condition worsens.  Duration of encounter: *** minutes.  Note by: Gaspar Cola, MD Date: 03/11/2022; Time: 3:21 PM

## 2022-03-11 ENCOUNTER — Encounter: Payer: Self-pay | Admitting: Pain Medicine

## 2022-03-11 ENCOUNTER — Ambulatory Visit: Payer: Medicaid Other | Attending: Pain Medicine | Admitting: Pain Medicine

## 2022-03-11 VITALS — BP 139/70 | HR 82 | Temp 98.4°F | Resp 16 | Ht <= 58 in | Wt 130.0 lb

## 2022-03-11 DIAGNOSIS — Z969 Presence of functional implant, unspecified: Secondary | ICD-10-CM

## 2022-03-11 DIAGNOSIS — M79604 Pain in right leg: Secondary | ICD-10-CM | POA: Insufficient documentation

## 2022-03-11 DIAGNOSIS — M79605 Pain in left leg: Secondary | ICD-10-CM | POA: Insufficient documentation

## 2022-03-11 DIAGNOSIS — G894 Chronic pain syndrome: Secondary | ICD-10-CM | POA: Diagnosis present

## 2022-03-11 DIAGNOSIS — Z79899 Other long term (current) drug therapy: Secondary | ICD-10-CM | POA: Diagnosis present

## 2022-03-11 DIAGNOSIS — G90523 Complex regional pain syndrome I of lower limb, bilateral: Secondary | ICD-10-CM

## 2022-03-11 DIAGNOSIS — G90513 Complex regional pain syndrome I of upper limb, bilateral: Secondary | ICD-10-CM | POA: Diagnosis present

## 2022-03-11 DIAGNOSIS — Z79891 Long term (current) use of opiate analgesic: Secondary | ICD-10-CM | POA: Diagnosis present

## 2022-03-11 DIAGNOSIS — G8929 Other chronic pain: Secondary | ICD-10-CM | POA: Diagnosis present

## 2022-03-11 MED ORDER — HYDROCODONE-ACETAMINOPHEN 5-325 MG PO TABS: 1.0000 | ORAL_TABLET | Freq: Four times a day (QID) | ORAL | 0 refills | Status: DC | PRN

## 2022-03-11 MED ORDER — NALOXONE HCL 4 MG/0.1ML NA LIQD: 1.0000 | NASAL | 0 refills | Status: AC | PRN

## 2022-03-11 NOTE — Progress Notes (Signed)
Nursing Pain Medication Assessment:  Safety precautions to be maintained throughout the outpatient stay will include: orient to surroundings, keep bed in low position, maintain call bell within reach at all times, provide assistance with transfer out of bed and ambulation.  Medication Inspection Compliance: Pill count conducted under aseptic conditions, in front of the patient. Neither the pills nor the bottle was removed from the patient's sight at any time. Once count was completed pills were immediately returned to the patient in their original bottle.  Medication: Hydrocodone/APAP Pill/Patch Count:  12 of 120 pills remain Pill/Patch Appearance: Markings consistent with prescribed medication Bottle Appearance: Standard pharmacy container. Clearly labeled. Filled Date: 5 / 8 / 2023 Last Medication intake:  TodaySafety precautions to be maintained throughout the outpatient stay will include: orient to surroundings, keep bed in low position, maintain call bell within reach at all times, provide assistance with transfer out of bed and ambulation.

## 2022-03-11 NOTE — Patient Instructions (Signed)
____________________________________________________________________________________________  Medication Rules  Purpose: To inform patients, and their family members, of our rules and regulations.  Applies to: All patients receiving prescriptions (written or electronic).  Pharmacy of record: Pharmacy where electronic prescriptions will be sent. If written prescriptions are taken to a different pharmacy, please inform the nursing staff. The pharmacy listed in the electronic medical record should be the one where you would like electronic prescriptions to be sent.  Electronic prescriptions: In compliance with the [place] Strengthen Opioid Misuse Prevention (STOP) Act of 2017 (Session Law 2017-74/H243), effective October 12, 2018, all controlled substances must be electronically prescribed. Calling prescriptions to the pharmacy will cease to exist.  Prescription refills: Only during scheduled appointments. Applies to all prescriptions.  NOTE: The following applies primarily to controlled substances (Opioid* Pain Medications).   Type of encounter (visit): For patients receiving controlled substances, face-to-face visits are required. (Not an option or up to the patient.)  Patient's responsibilities: Pain Pills: Bring all pain pills to every appointment (except for procedure appointments). Pill Bottles: Bring pills in original pharmacy bottle. Always bring the newest bottle. Bring bottle, even if empty. Medication refills: You are responsible for knowing and keeping track of what medications you take and those you need refilled. The day before your appointment: write a list of all prescriptions that need to be refilled. The day of the appointment: give the list to the admitting nurse. Prescriptions will be written only during appointments. No prescriptions will be written on procedure days. If you forget a medication: it will not be "Called in", "Faxed", or "electronically sent". You will  need to get another appointment to get these prescribed. No early refills. Do not call asking to have your prescription filled early. Prescription Accuracy: You are responsible for carefully inspecting your prescriptions before leaving our office. Have the discharge nurse carefully go over each prescription with you, before taking them home. Make sure that your name is accurately spelled, that your address is correct. Check the name and dose of your medication to make sure it is accurate. Check the number of pills, and the written instructions to make sure they are clear and accurate. Make sure that you are given enough medication to last until your next medication refill appointment. Taking Medication: Take medication as prescribed. When it comes to controlled substances, taking less pills or less frequently than prescribed is permitted and encouraged. Never take more pills than instructed. Never take medication more frequently than prescribed.  Inform other Doctors: Always inform, all of your healthcare providers, of all the medications you take. Pain Medication from other Providers: You are not allowed to accept any additional pain medication from any other Doctor or Healthcare provider. There are two exceptions to this rule. (see below) In the event that you require additional pain medication, you are responsible for notifying us, as stated below. Cough Medicine: Often these contain an opioid, such as codeine or hydrocodone. Never accept or take cough medicine containing these opioids if you are already taking an opioid* medication. The combination may cause respiratory failure and death. Medication Agreement: You are responsible for carefully reading and following our Medication Agreement. This must be signed before receiving any prescriptions from our practice. Safely store a copy of your signed Agreement. Violations to the Agreement will result in no further prescriptions. (Additional copies of our  Medication Agreement are available upon request.) Laws, Rules, & Regulations: All patients are expected to follow all Federal and State Laws, Statutes, Rules, & Regulations. Ignorance of   the Laws does not constitute a valid excuse.  Illegal drugs and Controlled Substances: The use of illegal substances (including, but not limited to marijuana and its derivatives) and/or the illegal use of any controlled substances is strictly prohibited. Violation of this rule may result in the immediate and permanent discontinuation of any and all prescriptions being written by our practice. The use of any illegal substances is prohibited. Adopted CDC guidelines & recommendations: Target dosing levels will be at or below 60 MME/day. Use of benzodiazepines** is not recommended.  Exceptions: There are only two exceptions to the rule of not receiving pain medications from other Healthcare Providers. Exception #1 (Emergencies): In the event of an emergency (i.e.: accident requiring emergency care), you are allowed to receive additional pain medication. However, you are responsible for: As soon as you are able, call our office [phone], at any time of the day or night, and leave a message stating your name, the date and nature of the emergency, and the name and dose of the medication prescribed. In the event that your call is answered by a member of our staff, make sure to document and save the date, time, and the name of the person that took your information.  Exception #2 (Planned Surgery): In the event that you are scheduled by another doctor or dentist to have any type of surgery or procedure, you are allowed (for a period no longer than 30 days), to receive additional pain medication, for the acute post-op pain. However, in this case, you are responsible for picking up a copy of our "Post-op Pain Management for Surgeons" handout, and giving it to your surgeon or dentist. This document is available at our office, and  does not require an appointment to obtain it. Simply go to our office during business hours (Monday-Thursday from 8:00 AM to 4:00 PM) (Friday 8:00 AM to 12:00 Noon) or if you have a scheduled appointment with us, prior to your surgery, and ask for it by name. In addition, you are responsible for: calling our office [phone], at any time of the day or night, and leaving a message stating your name, name of your surgeon, type of surgery, and date of procedure or surgery. Failure to comply with your responsibilities may result in termination of therapy involving the controlled substances. Medication Agreement Violation. Following the above rules, including your responsibilities will help you in avoiding a Medication Agreement Violation ("Breaking your Pain Medication Contract").  *Opioid medications include: morphine, codeine, oxycodone, oxymorphone, hydrocodone, hydromorphone, meperidine, tramadol, tapentadol, buprenorphine, fentanyl, methadone. **Benzodiazepine medications include: diazepam (Valium), alprazolam (Xanax), clonazepam (Klonopine), lorazepam (Ativan), clorazepate (Tranxene), chlordiazepoxide (Librium), estazolam (Prosom), oxazepam (Serax), temazepam (Restoril), triazolam (Halcion) (Last updated: 07/09/2021) ____________________________________________________________________________________________  ____________________________________________________________________________________________  Medication Recommendations and Reminders  Applies to: All patients receiving prescriptions (written and/or electronic).  Medication Rules & Regulations: These rules and regulations exist for your safety and that of others. They are not flexible and neither are we. Dismissing or ignoring them will be considered "non-compliance" with medication therapy, resulting in complete and irreversible termination of such therapy. (See document titled "Medication Rules" for more details.) In all conscience,  because of safety reasons, we cannot continue providing a therapy where the patient does not follow instructions.  Pharmacy of record:  Definition: This is the pharmacy where your electronic prescriptions will be sent.  We do not endorse any particular pharmacy, however, we have experienced problems with [facility] not securing enough medication supply for the community. We do not restrict you   in your choice of pharmacy. However, once we write for your prescriptions, we will NOT be re-sending more prescriptions to fix restricted supply problems created by your pharmacy, or your insurance.  The pharmacy listed in the electronic medical record should be the one where you want electronic prescriptions to be sent. If you choose to change pharmacy, simply notify our nursing staff.  Recommendations: Keep all of your pain medications in a safe place, under lock and key, even if you live alone. We will NOT replace lost, stolen, or damaged medication. After you fill your prescription, take 1 week's worth of pills and put them away in a safe place. You should keep a separate, properly labeled bottle for this purpose. The remainder should be kept in the original bottle. Use this as your primary supply, until it runs out. Once it's gone, then you know that you have 1 week's worth of medicine, and it is time to come in for a prescription refill. If you do this correctly, it is unlikely that you will ever run out of medicine. To make sure that the above recommendation works, it is very important that you make sure your medication refill appointments are scheduled at least 1 week before you run out of medicine. To do this in an effective manner, make sure that you do not leave the office without scheduling your next medication management appointment. Always ask the nursing staff to show you in your prescription , when your medication will be running out. Then arrange for the receptionist to get you a return appointment,  at least 7 days before you run out of medicine. Do not wait until you have 1 or 2 pills left, to come in. This is very poor planning and does not take into consideration that we may need to cancel appointments due to bad weather, sickness, or emergencies affecting our staff. DO NOT ACCEPT A "Partial Fill": If for any reason your pharmacy does not have enough pills/tablets to completely fill or refill your prescription, do not allow for a "partial fill". The law allows the pharmacy to complete that prescription within 72 hours, without requiring a new prescription. If they do not fill the rest of your prescription within those 72 hours, you will need a separate prescription to fill the remaining amount, which we will NOT provide. If the reason for the partial fill is your insurance, you will need to talk to the pharmacist about payment alternatives for the remaining tablets, but again, DO NOT ACCEPT A PARTIAL FILL, unless you can trust your pharmacist to obtain the remainder of the pills within 72 hours.  Prescription refills and/or changes in medication(s):  Prescription refills, and/or changes in dose or medication, will be conducted only during scheduled medication management appointments. (Applies to both, written and electronic prescriptions.) No refills on procedure days. No medication will be changed or started on procedure days. No changes, adjustments, and/or refills will be conducted on a procedure day. Doing so will interfere with the diagnostic portion of the procedure. No phone refills. No medications will be "called into the pharmacy". No Fax refills. No weekend refills. No Holliday refills. No after hours refills.  Remember:  Business hours are:  Monday to Thursday 8:00 AM to 4:00 PM Provider's Schedule: Nyeema Want, MD - Appointments are:  Medication management: Monday and Wednesday 8:00 AM to 4:00 PM Procedure day: Tuesday and Thursday 7:30 AM to 4:00 PM Bilal Lateef, MD -  Appointments are:  Medication management: Tuesday and Thursday 8:00   AM to 4:00 PM Procedure day: Monday and Wednesday 7:30 AM to 4:00 PM (Last update: 05/01/2020) ____________________________________________________________________________________________  ____________________________________________________________________________________________  CBD (cannabidiol) & Delta-8 (Delta-8 tetrahydrocannabinol) WARNING  Intro: Cannabidiol (CBD) and tetrahydrocannabinol (THC), are two natural compounds found in plants of the Cannabis genus. They can both be extracted from hemp or cannabis. Hemp and cannabis come from the Cannabis sativa plant. Both compounds interact with your body's endocannabinoid system, but they have very different effects. CBD does not produce the high sensation associated with cannabis. Delta-8 tetrahydrocannabinol, also known as delta-8 THC, is a psychoactive substance found in the Cannabis sativa plant, of which marijuana and hemp are two varieties. THC is responsible for the high associated with the illicit use of marijuana.  Applicable to: All individuals currently taking or considering taking CBD (cannabidiol) and, more important, all patients taking opioid analgesic controlled substances (pain medication). (Example: oxycodone; oxymorphone; hydrocodone; hydromorphone; morphine; methadone; tramadol; tapentadol; fentanyl; buprenorphine; butorphanol; dextromethorphan; meperidine; codeine; etc.)  Legal status: CBD remains a Schedule I drug prohibited for any use. CBD is illegal with one exception. In the [place], CBD has a limited Food and Drug Administration (FDA) approval for the treatment of two specific types of epilepsy disorders. Only one CBD product has been approved by the FDA for this purpose: "Epidiolex". FDA is aware that some companies are marketing products containing cannabis and cannabis-derived compounds in ways that violate the Federal Food, Drug and Cosmetic Act  (FD&C Act) and that may put the health and safety of consumers at risk. The FDA, a Federal agency, has not enforced the CBD status since 2018. UPDATE: (11/28/2021) The Drug Enforcement Agency (DEA) issued a letter stating that "delta" cannabinoids, including Delta-8-THCO and Delta-9-THCO, synthetically derived from hemp do not qualify as hemp and will be viewed as Schedule I drugs. (Schedule I drugs, substances, or chemicals are defined as drugs with no currently accepted medical use and a high potential for abuse. Some examples of Schedule I drugs are: heroin, lysergic acid diethylamide (LSD), marijuana (cannabis), 3,4-methylenedioxymethamphetamine (ecstasy), methaqualone, and peyote.) (https://www.dea.gov)  Legality: Some manufacturers ship CBD products nationally, which is illegal. Often such products are sold online and are therefore available throughout the country. CBD is openly sold in head shops and health food stores in some states where such sales have not been explicitly legalized. Selling unapproved products with unsubstantiated therapeutic claims is not only a violation of the law, but also can put patients at risk, as these products have not been proven to be safe or effective. Federal illegality makes it difficult to conduct research on CBD.  Reference: "FDA Regulation of Cannabis and Cannabis-Derived Products, Including Cannabidiol (CBD)" - https://www.fda.gov/news-events/public-health-focus/fda-regulation-cannabis-and-cannabis-derived-products-including-cannabidiol-cbd  Warning: CBD is not FDA approved and has not undergo the same manufacturing controls as prescription drugs.  This means that the purity and safety of available CBD may be questionable. Most of the time, despite manufacturer's claims, it is contaminated with THC (delta-9-tetrahydrocannabinol - the chemical in marijuana responsible for the "HIGH").  When this is the case, the THC contaminant will trigger a positive urine drug  screen (UDS) test for Marijuana (carboxy-THC). Because a positive UDS for any illicit substance is a violation of our medication agreement, your opioid analgesics (pain medicine) may be permanently discontinued. The FDA recently put out a warning about 5 things that everyone should be aware of regarding Delta-8 THC: Delta-8 THC products have not been evaluated or approved by the FDA for safe use and may be marketed in ways that put the   public health at risk. The FDA has received adverse event reports involving delta-8 THC-containing products. Delta-8 THC has psychoactive and intoxicating effects. Delta-8 THC manufacturing often involve use of potentially harmful chemicals to create the concentrations of delta-8 THC claimed in the marketplace. The final delta-8 THC product may have potentially harmful by-products (contaminants) due to the chemicals used in the process. Manufacturing of delta-8 THC products may occur in uncontrolled or unsanitary settings, which may lead to the presence of unsafe contaminants or other potentially harmful substances. Delta-8 THC products should be kept out of the reach of children and pets.  MORE ABOUT CBD  General Information: CBD was discovered in 1940 and it is a derivative of the cannabis sativa genus plants (Marijuana and Hemp). It is one of the 113 identified substances found in Marijuana. It accounts for up to 40% of the plant's extract. As of 2018, preliminary clinical studies on CBD included research for the treatment of anxiety, movement disorders, and pain. CBD is available and consumed in multiple forms, including inhalation of smoke or vapor, as an aerosol spray, and by mouth. It may be supplied as an oil containing CBD, capsules, dried cannabis, or as a liquid solution. CBD is thought not to be as psychoactive as THC (delta-9-tetrahydrocannabinol - the chemical in marijuana responsible for the "HIGH"). Studies suggest that CBD may interact with different  biological target receptors in the body, including cannabinoid and other neurotransmitter receptors. As of 2018 the mechanism of action for its biological effects has not been determined.  Side-effects  Adverse reactions: Dry mouth, diarrhea, decreased appetite, fatigue, drowsiness, malaise, weakness, sleep disturbances, and others.  Drug interactions: CBC may interact with other medications such as blood-thinners. Because CBD causes drowsiness on its own, it also increases the drowsiness caused by other medications, including antihistamines (such as Benadryl), benzodiazepines (Xanax, Ativan, Valium), antipsychotics, antidepressants and opioids, as well as alcohol and supplements such as kava, melatonin and St. John's Wort. Be cautious with the following combinations:   Brivaracetam (Briviact) Brivaracetam is changed and broken down by the body. CBD might decrease how quickly the body breaks down brivaracetam. This might increase levels of brivaracetam in the body.  Caffeine Caffeine is changed and broken down by the body. CBD might decrease how quickly the body breaks down caffeine. This might increase levels of caffeine in the body.  Carbamazepine (Tegretol) Carbamazepine is changed and broken down by the body. CBD might decrease how quickly the body breaks down carbamazepine. This might increase levels of carbamazepine in the body and increase its side effects.  Citalopram (Celexa) Citalopram is changed and broken down by the body. CBD might decrease how quickly the body breaks down citalopram. This might increase levels of citalopram in the body and increase its side effects.  Clobazam (Onfi) Clobazam is changed and broken down by the liver. CBD might decrease how quickly the liver breaks down clobazam. This might increase the effects and side effects of clobazam.  Eslicarbazepine (Aptiom) Eslicarbazepine is changed and broken down by the body. CBD might decrease how quickly the body  breaks down eslicarbazepine. This might increase levels of eslicarbazepine in the body by a small amount.  Everolimus (Zostress) Everolimus is changed and broken down by the body. CBD might decrease how quickly the body breaks down everolimus. This might increase levels of everolimus in the body.  Lithium Taking higher doses of CBD might increase levels of lithium. This can increase the risk of lithium toxicity.  Medications changed by the liver (  Cytochrome P450 1A1 (CYP1A1) substrates) Some medications are changed and broken down by the liver. CBD might change how quickly the liver breaks down these medications. This could change the effects and side effects of these medications.  Medications changed by the liver (Cytochrome P450 1A2 (CYP1A2) substrates) Some medications are changed and broken down by the liver. CBD might change how quickly the liver breaks down these medications. This could change the effects and side effects of these medications.  Medications changed by the liver (Cytochrome P450 1B1 (CYP1B1) substrates) Some medications are changed and broken down by the liver. CBD might change how quickly the liver breaks down these medications. This could change the effects and side effects of these medications.  Medications changed by the liver (Cytochrome P450 2A6 (CYP2A6) substrates) Some medications are changed and broken down by the liver. CBD might change how quickly the liver breaks down these medications. This could change the effects and side effects of these medications.  Medications changed by the liver (Cytochrome P450 2B6 (CYP2B6) substrates) Some medications are changed and broken down by the liver. CBD might change how quickly the liver breaks down these medications. This could change the effects and side effects of these medications.  Medications changed by the liver (Cytochrome P450 2C19 (CYP2C19) substrates) Some medications are changed and broken down by the liver.  CBD might change how quickly the liver breaks down these medications. This could change the effects and side effects of these medications.  Medications changed by the liver (Cytochrome P450 2C8 (CYP2C8) substrates) Some medications are changed and broken down by the liver. CBD might change how quickly the liver breaks down these medications. This could change the effects and side effects of these medications.  Medications changed by the liver (Cytochrome P450 2C9 (CYP2C9) substrates) Some medications are changed and broken down by the liver. CBD might change how quickly the liver breaks down these medications. This could change the effects and side effects of these medications.  Medications changed by the liver (Cytochrome P450 2D6 (CYP2D6) substrates) Some medications are changed and broken down by the liver. CBD might change how quickly the liver breaks down these medications. This could change the effects and side effects of these medications.  Medications changed by the liver (Cytochrome P450 2E1 (CYP2E1) substrates) Some medications are changed and broken down by the liver. CBD might change how quickly the liver breaks down these medications. This could change the effects and side effects of these medications.  Medications changed by the liver (Cytochrome P450 3A4 (CYP3A4) substrates) Some medications are changed and broken down by the liver. CBD might change how quickly the liver breaks down these medications. This could change the effects and side effects of these medications.  Medications changed by the liver (Glucuronidated drugs) Some medications are changed and broken down by the liver. CBD might change how quickly the liver breaks down these medications. This could change the effects and side effects of these medications.  Medications that decrease the breakdown of other medications by the liver (Cytochrome P450 2C19 (CYP2C19) inhibitors) CBD is changed and broken down by the liver.  Some drugs decrease how quickly the liver changes and breaks down CBD. This could change the effects and side effects of CBD.  Medications that decrease the breakdown of other medications in the liver (Cytochrome P450 3A4 (CYP3A4) inhibitors) CBD is changed and broken down by the liver. Some drugs decrease how quickly the liver changes and breaks down CBD. This could change the effects   and side effects of CBD.  Medications that increase breakdown of other medications by the liver (Cytochrome P450 3A4 (CYP3A4) inducers) CBD is changed and broken down by the liver. Some drugs increase how quickly the liver changes and breaks down CBD. This could change the effects and side effects of CBD.  Medications that increase the breakdown of other medications by the liver (Cytochrome P450 2C19 (CYP2C19) inducers) CBD is changed and broken down by the liver. Some drugs increase how quickly the liver changes and breaks down CBD. This could change the effects and side effects of CBD.  Methadone (Dolophine) Methadone is broken down by the liver. CBD might decrease how quickly the liver breaks down methadone. Taking cannabidiol along with methadone might increase the effects and side effects of methadone.  Rufinamide (Banzel) Rufinamide is changed and broken down by the body. CBD might decrease how quickly the body breaks down rufinamide. This might increase levels of rufinamide in the body by a small amount.  Sedative medications (CNS depressants) CBD might cause sleepiness and slowed breathing. Some medications, called sedatives, can also cause sleepiness and slowed breathing. Taking CBD with sedative medications might cause breathing problems and/or too much sleepiness.  Sirolimus (Rapamune) Sirolimus is changed and broken down by the body. CBD might decrease how quickly the body breaks down sirolimus. This might increase levels of sirolimus in the body.  Stiripentol (Diacomit) Stiripentol is changed and  broken down by the body. CBD might decrease how quickly the body breaks down stiripentol. This might increase levels of stiripentol in the body and increase its side effects.  Tacrolimus (Prograf) Tacrolimus is changed and broken down by the body. CBD might decrease how quickly the body breaks down tacrolimus. This might increase levels of tacrolimus in the body.  Tamoxifen (Soltamox) Tamoxifen is changed and broken down by the body. CBD might affect how quickly the body breaks down tamoxifen. This might affect levels of tamoxifen in the body.  Topiramate (Topamax) Topiramate is changed and broken down by the body. CBD might decrease how quickly the body breaks down topiramate. This might increase levels of topiramate in the body by a small amount.  Valproate Valproic acid can cause liver injury. Taking cannabidiol with valproic acid might increase the chance of liver injury. CBD and/or valproic acid might need to be stopped, or the dose might need to be reduced.  Warfarin (Coumadin) CBD might increase levels of warfarin, which can increase the risk for bleeding. CBD and/or warfarin might need to be stopped, or the dose might need to be reduced.  Zonisamide Zonisamide is changed and broken down by the body. CBD might decrease how quickly the body breaks down zonisamide. This might increase levels of zonisamide in the body by a small amount. (Last update: 12/10/2021) ____________________________________________________________________________________________  ____________________________________________________________________________________________  Drug Holidays (Slow)  What is a "Drug Holiday"? Drug Holiday: is the name given to the period of time during which a patient stops taking a medication(s) for the purpose of eliminating tolerance to the drug.  Benefits Improved effectiveness of opioids. Decreased opioid dose needed to achieve benefits. Improved pain with lesser  dose.  What is tolerance? Tolerance: is the progressive decreased in effectiveness of a drug due to its repetitive use. With repetitive use, the body gets use to the medication and as a consequence, it loses its effectiveness. This is a common problem seen with opioid pain medications. As a result, a larger dose of the drug is needed to achieve the same effect that   used to be obtained with a smaller dose.  How long should a "Drug Holiday" last? You should stay off of the pain medicine for at least 14 consecutive days. (2 weeks)  Should I stop the medicine "cold turkey"? No. You should always coordinate with your Pain Specialist so that he/she can provide you with the correct medication dose to make the transition as smoothly as possible.  How do I stop the medicine? Slowly. You will be instructed to decrease the daily amount of pills that you take by one (1) pill every seven (7) days. This is called a "slow downward taper" of your dose. For example: if you normally take four (4) pills per day, you will be asked to drop this dose to three (3) pills per day for seven (7) days, then to two (2) pills per day for seven (7) days, then to one (1) per day for seven (7) days, and at the end of those last seven (7) days, this is when the "Drug Holiday" would start.   Will I have withdrawals? By doing a "slow downward taper" like this one, it is unlikely that you will experience any significant withdrawal symptoms. Typically, what triggers withdrawals is the sudden stop of a high dose opioid therapy. Withdrawals can usually be avoided by slowly decreasing the dose over a prolonged period of time. If you do not follow these instructions and decide to stop your medication abruptly, withdrawals may be possible.  What are withdrawals? Withdrawals: refers to the wide range of symptoms that occur after stopping or dramatically reducing opiate drugs after heavy and prolonged use. Withdrawal symptoms do not occur to  patients that use low dose opioids, or those who take the medication sporadically. Contrary to benzodiazepine (example: Valium, Xanax, etc.) or alcohol withdrawals ("Delirium Tremens"), opioid withdrawals are not lethal. Withdrawals are the physical manifestation of the body getting rid of the excess receptors.  Expected Symptoms Early symptoms of withdrawal may include: Agitation Anxiety Muscle aches Increased tearing Insomnia Runny nose Sweating Yawning  Late symptoms of withdrawal may include: Abdominal cramping Diarrhea Dilated pupils Goose bumps Nausea Vomiting  Will I experience withdrawals? Due to the slow nature of the taper, it is very unlikely that you will experience any.  What is a slow taper? Taper: refers to the gradual decrease in dose.  (Last update: 05/01/2020) ____________________________________________________________________________________________    

## 2022-03-16 LAB — TOXASSURE SELECT 13 (MW), URINE

## 2022-05-14 ENCOUNTER — Other Ambulatory Visit: Payer: Self-pay

## 2022-05-14 ENCOUNTER — Emergency Department: Payer: Medicaid Other

## 2022-05-14 ENCOUNTER — Inpatient Hospital Stay
Admission: EM | Admit: 2022-05-14 | Discharge: 2022-05-25 | DRG: 190 | Disposition: A | Discharge status: Home / Self Care | Payer: Medicaid Other | Attending: Internal Medicine | Admitting: Internal Medicine

## 2022-05-14 ENCOUNTER — Encounter: Payer: Self-pay | Admitting: Emergency Medicine

## 2022-05-14 DIAGNOSIS — Z7951 Long term (current) use of inhaled steroids: Secondary | ICD-10-CM | POA: Diagnosis not present

## 2022-05-14 DIAGNOSIS — I5042 Chronic combined systolic (congestive) and diastolic (congestive) heart failure: Secondary | ICD-10-CM | POA: Diagnosis present

## 2022-05-14 DIAGNOSIS — J9601 Acute respiratory failure with hypoxia: Secondary | ICD-10-CM | POA: Diagnosis present

## 2022-05-14 DIAGNOSIS — I110 Hypertensive heart disease with heart failure: Secondary | ICD-10-CM | POA: Diagnosis present

## 2022-05-14 DIAGNOSIS — J9621 Acute and chronic respiratory failure with hypoxia: Secondary | ICD-10-CM | POA: Diagnosis present

## 2022-05-14 DIAGNOSIS — I509 Heart failure, unspecified: Secondary | ICD-10-CM

## 2022-05-14 DIAGNOSIS — G8929 Other chronic pain: Secondary | ICD-10-CM

## 2022-05-14 DIAGNOSIS — G90513 Complex regional pain syndrome I of upper limb, bilateral: Secondary | ICD-10-CM

## 2022-05-14 DIAGNOSIS — I5032 Chronic diastolic (congestive) heart failure: Secondary | ICD-10-CM | POA: Diagnosis not present

## 2022-05-14 DIAGNOSIS — D649 Anemia, unspecified: Secondary | ICD-10-CM | POA: Diagnosis present

## 2022-05-14 DIAGNOSIS — Z20822 Contact with and (suspected) exposure to covid-19: Secondary | ICD-10-CM | POA: Diagnosis present

## 2022-05-14 DIAGNOSIS — F32A Depression, unspecified: Secondary | ICD-10-CM | POA: Diagnosis present

## 2022-05-14 DIAGNOSIS — J410 Simple chronic bronchitis: Secondary | ICD-10-CM

## 2022-05-14 DIAGNOSIS — F411 Generalized anxiety disorder: Secondary | ICD-10-CM | POA: Diagnosis present

## 2022-05-14 DIAGNOSIS — Z87891 Personal history of nicotine dependence: Secondary | ICD-10-CM | POA: Diagnosis not present

## 2022-05-14 DIAGNOSIS — Z9682 Presence of neurostimulator: Secondary | ICD-10-CM | POA: Diagnosis not present

## 2022-05-14 DIAGNOSIS — G894 Chronic pain syndrome: Secondary | ICD-10-CM

## 2022-05-14 DIAGNOSIS — J441 Chronic obstructive pulmonary disease with (acute) exacerbation: Secondary | ICD-10-CM | POA: Diagnosis present

## 2022-05-14 DIAGNOSIS — Z79891 Long term (current) use of opiate analgesic: Secondary | ICD-10-CM

## 2022-05-14 DIAGNOSIS — F1190 Opioid use, unspecified, uncomplicated: Secondary | ICD-10-CM

## 2022-05-14 DIAGNOSIS — N179 Acute kidney failure, unspecified: Secondary | ICD-10-CM | POA: Diagnosis present

## 2022-05-14 DIAGNOSIS — Z79899 Other long term (current) drug therapy: Secondary | ICD-10-CM

## 2022-05-14 DIAGNOSIS — Z515 Encounter for palliative care: Secondary | ICD-10-CM | POA: Diagnosis not present

## 2022-05-14 DIAGNOSIS — G90523 Complex regional pain syndrome I of lower limb, bilateral: Secondary | ICD-10-CM

## 2022-05-14 DIAGNOSIS — Z7189 Other specified counseling: Secondary | ICD-10-CM | POA: Diagnosis not present

## 2022-05-14 DIAGNOSIS — Z888 Allergy status to other drugs, medicaments and biological substances status: Secondary | ICD-10-CM

## 2022-05-14 DIAGNOSIS — R54 Age-related physical debility: Secondary | ICD-10-CM | POA: Diagnosis present

## 2022-05-14 DIAGNOSIS — R0603 Acute respiratory distress: Secondary | ICD-10-CM

## 2022-05-14 DIAGNOSIS — F419 Anxiety disorder, unspecified: Secondary | ICD-10-CM

## 2022-05-14 DIAGNOSIS — R7989 Other specified abnormal findings of blood chemistry: Secondary | ICD-10-CM | POA: Diagnosis present

## 2022-05-14 DIAGNOSIS — I10 Essential (primary) hypertension: Secondary | ICD-10-CM | POA: Diagnosis present

## 2022-05-14 DIAGNOSIS — Z969 Presence of functional implant, unspecified: Secondary | ICD-10-CM

## 2022-05-14 LAB — COMPREHENSIVE METABOLIC PANEL
ALT: 18 U/L (ref 0–44)
AST: 22 U/L (ref 15–41)
Albumin: 4.3 g/dL (ref 3.5–5.0)
Alkaline Phosphatase: 115 U/L (ref 38–126)
Anion gap: 10 (ref 5–15)
BUN: 17 mg/dL (ref 8–23)
CO2: 25 mmol/L (ref 22–32)
Calcium: 9.5 mg/dL (ref 8.9–10.3)
Chloride: 105 mmol/L (ref 98–111)
Creatinine, Ser: 1.05 mg/dL — ABNORMAL HIGH (ref 0.44–1.00)
GFR, Estimated: >60 mL/min (ref >60)
Glucose, Bld: 109 mg/dL — ABNORMAL HIGH (ref 70–99)
Potassium: 4.1 mmol/L (ref 3.5–5.1)
Sodium: 140 mmol/L (ref 135–145)
Total Bilirubin: 0.4 mg/dL (ref 0.3–1.2)
Total Protein: 7.7 g/dL (ref 6.5–8.1)

## 2022-05-14 LAB — TROPONIN I (HIGH SENSITIVITY)
Troponin I (High Sensitivity): 4 ng/L (ref <18)
Troponin I (High Sensitivity): 3 ng/L (ref <18)

## 2022-05-14 LAB — CBC
HCT: 32.3 % — ABNORMAL LOW (ref 36.0–46.0)
Hemoglobin: 9.7 g/dL — ABNORMAL LOW (ref 12.0–15.0)
MCH: 25.1 pg — ABNORMAL LOW (ref 26.0–34.0)
MCHC: 30 g/dL (ref 30.0–36.0)
MCV: 83.5 fL (ref 80.0–100.0)
Platelets: 346 10*3/uL (ref 150–400)
RBC: 3.87 MIL/uL (ref 3.87–5.11)
RDW: 15.6 % — ABNORMAL HIGH (ref 11.5–15.5)
WBC: 8.2 10*3/uL (ref 4.0–10.5)
nRBC: 0 % (ref 0.0–0.2)

## 2022-05-14 LAB — GLUCOSE, CAPILLARY: Glucose-Capillary: 154 mg/dL — ABNORMAL HIGH (ref 70–99)

## 2022-05-14 LAB — BLOOD GAS, VENOUS
Acid-Base Excess: 0.3 mmol/L (ref 0.0–2.0)
Bicarbonate: 26 mmol/L (ref 20.0–28.0)
O2 Saturation: 93.4 %
Patient temperature: 37
pCO2, Ven: 45 mmHg (ref 44–60)
pH, Ven: 7.37 (ref 7.25–7.43)
pO2, Ven: 62 mmHg — ABNORMAL HIGH (ref 32–45)

## 2022-05-14 LAB — BRAIN NATRIURETIC PEPTIDE: B Natriuretic Peptide: 29.9 pg/mL (ref 0.0–100.0)

## 2022-05-14 LAB — D-DIMER, QUANTITATIVE: D-Dimer, Quant: 0.39 ug/mL-FEU (ref 0.00–0.50)

## 2022-05-14 MED ORDER — HYDROCODONE-ACETAMINOPHEN 5-325 MG PO TABS: 1.0000 | ORAL_TABLET | Freq: Four times a day (QID) | ORAL | Status: DC | PRN

## 2022-05-14 MED ORDER — SPIRONOLACTONE 25 MG PO TABS
25.0000 mg | ORAL_TABLET | Freq: Every day | ORAL | Status: DC
  Administered 2022-05-14 – 2022-05-25 (×12): 25 mg via ORAL
  Filled 2022-05-14 (×12): qty 1

## 2022-05-14 MED ORDER — ALBUTEROL SULFATE HFA 108 (90 BASE) MCG/ACT IN AERS: 2.0000 | INHALATION_SPRAY | Freq: Four times a day (QID) | RESPIRATORY_TRACT | Status: DC | PRN

## 2022-05-14 MED ORDER — MOMETASONE FURO-FORMOTEROL FUM 100-5 MCG/ACT IN AERO
2.0000 | INHALATION_SPRAY | Freq: Two times a day (BID) | RESPIRATORY_TRACT | Status: DC
Start: 2022-05-15 — End: 2022-05-25
  Administered 2022-05-15 – 2022-05-25 (×20): 2 via RESPIRATORY_TRACT
  Filled 2022-05-14: qty 8.8

## 2022-05-14 MED ORDER — FUROSEMIDE 10 MG/ML IJ SOLN
20.0000 mg | Freq: Two times a day (BID) | INTRAMUSCULAR | Status: DC
  Administered 2022-05-14: 20 mg via INTRAVENOUS
  Filled 2022-05-14: qty 2

## 2022-05-14 MED ORDER — MAGNESIUM SULFATE 2 GM/50ML IV SOLN
2.0000 g | Freq: Once | INTRAVENOUS | Status: AC
  Administered 2022-05-14: 2 g via INTRAVENOUS
  Filled 2022-05-14: qty 50

## 2022-05-14 MED ORDER — ALBUTEROL SULFATE (2.5 MG/3ML) 0.083% IN NEBU
2.5000 mg | INHALATION_SOLUTION | RESPIRATORY_TRACT | Status: DC | PRN
  Administered 2022-05-15: 2.5 mg via RESPIRATORY_TRACT
  Filled 2022-05-14: qty 3

## 2022-05-14 MED ORDER — BISACODYL 5 MG PO TBEC: 5.0000 mg | DELAYED_RELEASE_TABLET | Freq: Every day | ORAL | Status: DC | PRN

## 2022-05-14 MED ORDER — ACETAMINOPHEN 325 MG PO TABS: 650.0000 mg | ORAL_TABLET | Freq: Four times a day (QID) | ORAL | Status: DC | PRN

## 2022-05-14 MED ORDER — ATORVASTATIN CALCIUM 20 MG PO TABS
40.0000 mg | ORAL_TABLET | Freq: Every day | ORAL | Status: DC
  Administered 2022-05-14 – 2022-05-24 (×11): 40 mg via ORAL
  Filled 2022-05-14 (×11): qty 2

## 2022-05-14 MED ORDER — IPRATROPIUM-ALBUTEROL 0.5-2.5 (3) MG/3ML IN SOLN
3.0000 mL | Freq: Once | RESPIRATORY_TRACT | Status: AC
  Administered 2022-05-14: 3 mL via RESPIRATORY_TRACT
  Filled 2022-05-14: qty 3

## 2022-05-14 MED ORDER — GUAIFENESIN ER 600 MG PO TB12
600.0000 mg | ORAL_TABLET | Freq: Two times a day (BID) | ORAL | Status: DC | PRN
Start: 2022-05-14 — End: 2022-05-25
  Administered 2022-05-17 – 2022-05-19 (×2): 600 mg via ORAL
  Filled 2022-05-14 (×2): qty 1

## 2022-05-14 MED ORDER — HYDROXYZINE HCL 25 MG PO TABS
50.0000 mg | ORAL_TABLET | Freq: Two times a day (BID) | ORAL | Status: DC | PRN
  Administered 2022-05-15 – 2022-05-22 (×4): 50 mg via ORAL
  Filled 2022-05-14 (×4): qty 2

## 2022-05-14 MED ORDER — ONDANSETRON HCL 4 MG PO TABS: 4.0000 mg | ORAL_TABLET | Freq: Four times a day (QID) | ORAL | Status: DC | PRN

## 2022-05-14 MED ORDER — NICOTINE 14 MG/24HR TD PT24
14.0000 mg | MEDICATED_PATCH | Freq: Every day | TRANSDERMAL | Status: DC
  Administered 2022-05-14 – 2022-05-25 (×11): 14 mg via TRANSDERMAL
  Filled 2022-05-14 (×12): qty 1

## 2022-05-14 MED ORDER — LACTATED RINGERS IV SOLN: INTRAVENOUS | Status: AC

## 2022-05-14 MED ORDER — HEPARIN SODIUM (PORCINE) 5000 UNIT/ML IJ SOLN
5000.0000 [IU] | Freq: Three times a day (TID) | INTRAMUSCULAR | Status: DC
  Administered 2022-05-14 – 2022-05-15 (×3): 5000 [IU] via SUBCUTANEOUS
  Filled 2022-05-14 (×3): qty 1

## 2022-05-14 MED ORDER — POLYETHYLENE GLYCOL 3350 17 G PO PACK: 17.0000 g | PACK | Freq: Every day | ORAL | Status: DC | PRN

## 2022-05-14 MED ORDER — IPRATROPIUM-ALBUTEROL 0.5-2.5 (3) MG/3ML IN SOLN
3.0000 mL | Freq: Four times a day (QID) | RESPIRATORY_TRACT | Status: DC
  Administered 2022-05-15 (×2): 3 mL via RESPIRATORY_TRACT
  Filled 2022-05-14 (×2): qty 3

## 2022-05-14 MED ORDER — PREDNISONE 20 MG PO TABS
40.0000 mg | ORAL_TABLET | Freq: Every day | ORAL | Status: DC
  Administered 2022-05-16 – 2022-05-17 (×2): 40 mg via ORAL
  Filled 2022-05-14 (×2): qty 2

## 2022-05-14 MED ORDER — MORPHINE SULFATE (PF) 2 MG/ML IV SOLN: 2.0000 mg | INTRAVENOUS | Status: DC | PRN

## 2022-05-14 MED ORDER — PANTOPRAZOLE SODIUM 40 MG PO TBEC: 40.0000 mg | DELAYED_RELEASE_TABLET | Freq: Every day | ORAL | Status: DC

## 2022-05-14 MED ORDER — POTASSIUM CHLORIDE CRYS ER 20 MEQ PO TBCR
20.0000 meq | EXTENDED_RELEASE_TABLET | Freq: Every day | ORAL | Status: DC
Start: 2022-05-14 — End: 2022-05-25
  Administered 2022-05-14 – 2022-05-25 (×12): 20 meq via ORAL
  Filled 2022-05-14 (×12): qty 1

## 2022-05-14 MED ORDER — ONDANSETRON HCL 4 MG/2ML IJ SOLN
4.0000 mg | Freq: Four times a day (QID) | INTRAMUSCULAR | Status: DC | PRN
  Administered 2022-05-21: 4 mg via INTRAVENOUS
  Filled 2022-05-14: qty 2

## 2022-05-14 MED ORDER — DOCUSATE SODIUM 100 MG PO CAPS
100.0000 mg | ORAL_CAPSULE | Freq: Two times a day (BID) | ORAL | Status: DC
  Administered 2022-05-14 – 2022-05-24 (×12): 100 mg via ORAL
  Filled 2022-05-14 (×20): qty 1

## 2022-05-14 MED ORDER — HYDRALAZINE HCL 20 MG/ML IJ SOLN: 5.0000 mg | INTRAMUSCULAR | Status: DC | PRN

## 2022-05-14 MED ORDER — ACETAMINOPHEN 650 MG RE SUPP: 650.0000 mg | Freq: Four times a day (QID) | RECTAL | Status: DC | PRN

## 2022-05-14 MED ORDER — HYDROCODONE-ACETAMINOPHEN 5-325 MG PO TABS: 1.0000 | ORAL_TABLET | ORAL | Status: DC | PRN

## 2022-05-14 MED ORDER — METHYLPREDNISOLONE SODIUM SUCC 125 MG IJ SOLR
125.0000 mg | Freq: Once | INTRAMUSCULAR | Status: AC
  Administered 2022-05-14: 125 mg via INTRAVENOUS
  Filled 2022-05-14: qty 2

## 2022-05-14 MED ORDER — CARVEDILOL 3.125 MG PO TABS
3.1250 mg | ORAL_TABLET | Freq: Two times a day (BID) | ORAL | Status: DC
  Administered 2022-05-15 – 2022-05-25 (×19): 3.125 mg via ORAL
  Filled 2022-05-14 (×21): qty 1

## 2022-05-14 MED ORDER — METHYLPREDNISOLONE SODIUM SUCC 125 MG IJ SOLR
60.0000 mg | Freq: Two times a day (BID) | INTRAMUSCULAR | Status: AC
  Administered 2022-05-14 – 2022-05-15 (×2): 60 mg via INTRAVENOUS
  Filled 2022-05-14 (×2): qty 2

## 2022-05-14 MED ORDER — INSULIN ASPART 100 UNIT/ML IJ SOLN
0.0000 [IU] | Freq: Three times a day (TID) | INTRAMUSCULAR | Status: DC
  Administered 2022-05-15 – 2022-05-16 (×3): 3 [IU] via SUBCUTANEOUS
  Administered 2022-05-17: 8 [IU] via SUBCUTANEOUS
  Administered 2022-05-19 – 2022-05-21 (×4): 2 [IU] via SUBCUTANEOUS
  Administered 2022-05-22: 1 [IU] via SUBCUTANEOUS
  Administered 2022-05-22: 2 [IU] via SUBCUTANEOUS
  Administered 2022-05-22: 3 [IU] via SUBCUTANEOUS
  Administered 2022-05-23: 2 [IU] via SUBCUTANEOUS
  Administered 2022-05-23: 3 [IU] via SUBCUTANEOUS
  Administered 2022-05-24: 8 [IU] via SUBCUTANEOUS
  Administered 2022-05-24: 2 [IU] via SUBCUTANEOUS
  Administered 2022-05-25: 3 [IU] via SUBCUTANEOUS
  Administered 2022-05-25: 2 [IU] via SUBCUTANEOUS
  Filled 2022-05-14 (×17): qty 1

## 2022-05-14 MED ORDER — ZOLPIDEM TARTRATE 5 MG PO TABS: 5.0000 mg | ORAL_TABLET | Freq: Every evening | ORAL | Status: DC | PRN

## 2022-05-14 MED ORDER — PANTOPRAZOLE SODIUM 40 MG IV SOLR
40.0000 mg | Freq: Two times a day (BID) | INTRAVENOUS | Status: DC
  Administered 2022-05-14 – 2022-05-23 (×18): 40 mg via INTRAVENOUS
  Filled 2022-05-14 (×18): qty 10

## 2022-05-14 MED ORDER — NALOXONE HCL 4 MG/0.1ML NA LIQD: 1.0000 | NASAL | Status: DC | PRN

## 2022-05-14 MED ORDER — SODIUM CHLORIDE 0.9% FLUSH
3.0000 mL | Freq: Two times a day (BID) | INTRAVENOUS | Status: DC
  Administered 2022-05-14 – 2022-05-24 (×20): 3 mL via INTRAVENOUS

## 2022-05-14 MED ORDER — IPRATROPIUM-ALBUTEROL 0.5-2.5 (3) MG/3ML IN SOLN
3.0000 mL | Freq: Four times a day (QID) | RESPIRATORY_TRACT | Status: DC | PRN
Start: 2022-05-14 — End: 2022-05-25

## 2022-05-14 NOTE — ED Notes (Addendum)
Son at bedside with pt. Pt presents to ED with c/o SOB. Per son, SOB started about two days ago and got worse today. Pt also c/o centralized chest pain that started today was well pt describes pain as a pressure.   Pt's respirations are dyspnea at rest. Auditory wheezing noted. Pt states she doesn't know if she has a hx of COPD, wears 2L [place] at night when sleeping. Pt states she takes a lasix pill. Bilateral led swelling noted.

## 2022-05-14 NOTE — Assessment & Plan Note (Addendum)
--  cont coreg and aldactone

## 2022-05-14 NOTE — H&P (Signed)
History and Physical    Elizabeth [name] MPN:[id number] DOB: 1961-07-02 DOA: 05/14/2022  PCP: Lorn Junes, FNP    Patient coming from:  Home    Chief Complaint:  Home    HPI:  Elizabeth [name] is a 61 y.o. female seen in ed with complaints of sob since 3 days, patient at baseline is prescribed as needed oxygen at bedtime which she uses as needed however recently has been using it continuously because of her shortness of breath patient also reports lower extremity edema that is been progressively getting worse and has a history of congestive heart failure and COPD. Patient also has been using her inhalers frequently without much relief.  Today due to acute worsening patient came to the hospital. Patient does not report any headaches blurred vision speech or gait issues chest pain palpitations fevers chills nausea vomiting diarrhea abdominal pain or any urinary complaints.   Pt has past medical history of lisinopril allergy, RSD, hypertension, COPD, CHF, vitamin B-12 and vitamin D deficiency.   ED Course:   Vitals:   05/14/22 1900 05/14/22 1930 05/14/22 2000 05/14/22 2005  BP: 127/74 119/63 (!) 109/56 (!) 117/59  Pulse: 88 (!) 105 87 85  Resp: (!) 32 (!) 24 (!) 31 (!) 38  Temp: 99 F (37.2 C)     TempSrc: Oral     SpO2: 100% 97% 94% 95%  In the emergency room patient meets SIRS criteria.  Due to heart rate and respiratory rate.However chest x-ray is clear and source of infection is not suspected. Initial EKG is sinus tachycardia with a narrow QRS and no ST-T wave changes. Blood work shows mild anemia with a normal white count otherwise normal CBC, hemoglobin of 9.7. CMP shows glucose of 109, creatinine of 1.05 which is acutely worse. CTA ordered and pending. In the emergency room patient received magnesium, Solu-Medrol, DuoNeb treatments x3.  Review of Systems:  Review of Systems  Constitutional:  Positive for malaise/fatigue.  Respiratory:  Positive for cough,  shortness of breath and wheezing.   Cardiovascular:  Positive for leg swelling.  All other systems reviewed and are negative.  Past Medical History:  Diagnosis Date   Anxiety    Arm pain 07/25/2015   CHF (congestive heart failure) (HCC)    Congestive heart failure (HCC) 07/25/2015   COPD (chronic obstructive pulmonary disease) (HCC)    never been idagnosed   Depression    Hypertension    Lower extremity pain 07/25/2015   Reflux    RSD (reflex sympathetic dystrophy)    Spinal cord stimulator status    Vitamin B12 deficiency    Vitamin D deficiency     Past Surgical History:  Procedure Laterality Date   SPINAL CORD STIMULATOR IMPLANT     x 2     reports that she quit smoking about 4 years ago. Her smoking use included cigarettes. She has a 60.00 pack-year smoking history. She has never used smokeless tobacco. She reports that she does not drink alcohol and does not use drugs.  Allergies  Allergen Reactions   Lisinopril Anaphylaxis    Family History  Problem Relation Age of Onset   Cirrhosis Mother    Cancer Father     Prior to Admission medications   Medication Sig Start Date End Date Taking? Authorizing Provider  albuterol (PROVENTIL HFA;VENTOLIN HFA) 108 (90 Base) MCG/ACT inhaler Inhale 2 puffs into the lungs every 6 (six) hours as needed for wheezing or shortness of breath. 11/26/17  Gladstone Lighter, MD  atorvastatin (LIPITOR) 40 MG tablet Take 1 tablet (40 mg total) by mouth at bedtime. 08/18/18   Gladstone Lighter, MD  carvedilol (COREG) 3.125 MG tablet Take 1 tablet (3.125 mg total) by mouth 2 (two) times daily with a meal. 08/18/18   Gladstone Lighter, MD  furosemide (LASIX) 20 MG tablet Take 1 tablet (20 mg total) by mouth daily. 08/18/18   Gladstone Lighter, MD  HYDROcodone-acetaminophen (NORCO/VICODIN) 5-325 MG tablet Take 1 tablet by mouth every 6 (six) hours as needed for severe pain. Must last 30 days. 03/18/22 04/17/22  Milinda Pointer, MD   HYDROcodone-acetaminophen (NORCO/VICODIN) 5-325 MG tablet Take 1 tablet by mouth every 6 (six) hours as needed for severe pain. Must last 30 days. 04/17/22 05/17/22  Milinda Pointer, MD  HYDROcodone-acetaminophen (NORCO/VICODIN) 5-325 MG tablet Take 1 tablet by mouth every 6 (six) hours as needed for severe pain. Must last 30 days. 05/17/22 06/16/22  Milinda Pointer, MD  hydrOXYzine (ATARAX/VISTARIL) 50 MG tablet Take 1 tablet (50 mg total) by mouth 2 (two) times daily as needed for anxiety. 04/22/21   Enzo Bi, MD  ipratropium-albuterol (DUONEB) 0.5-2.5 (3) MG/3ML SOLN Take 3 mLs by nebulization every 6 (six) hours as needed (wheezing). 01/06/21   Fritzi Mandes, MD  mometasone-formoterol [facility]) 100-5 MCG/ACT AERO Inhale 2 puffs into the lungs 2 (two) times daily. 08/18/18 09/17/21  Gladstone Lighter, MD  naloxone [facility]) nasal spray 4 mg/0.1 mL Place 1 spray into the nose as needed for up to 365 doses (for opioid-induced respiratory depresssion). In case of emergency (overdose), spray once into each nostril. If no response within 3 minutes, repeat application and call [phone]. 03/11/22 03/11/23  Milinda Pointer, MD  pantoprazole (PROTONIX) 40 MG tablet Take 40 mg by mouth daily. 03/30/22   [provider]  potassium chloride SA (K-DUR,KLOR-CON) 20 MEQ tablet Take 20 mEq by mouth daily.    [provider]  spironolactone (ALDACTONE) 25 MG tablet Take 25 mg by mouth daily. 06/29/19 09/17/21  [provider]    Physical Exam: Vitals:   05/14/22 1900 05/14/22 1930 05/14/22 2000 05/14/22 2005  BP: 127/74 119/63 (!) 109/56 (!) 117/59  Pulse: 88 (!) 105 87 85  Resp: (!) 32 (!) 24 (!) 31 (!) 38  Temp: 99 F (37.2 C)     TempSrc: Oral     SpO2: 100% 97% 94% 95%   Physical Exam Vitals and nursing note reviewed.  Constitutional:      General: She is not in acute distress.    Appearance: She is obese. She is not ill-appearing, toxic-appearing or diaphoretic.  HENT:     Head:  Normocephalic and atraumatic.     Right Ear: Hearing and external ear normal.     Left Ear: Hearing and external ear normal.     Nose: Nose normal. No nasal deformity.     Mouth/Throat:     Lips: Pink.     Mouth: Mucous membranes are moist.     Tongue: No lesions.     Pharynx: Oropharynx is clear.  Eyes:     Extraocular Movements: Extraocular movements intact.  Cardiovascular:     Rate and Rhythm: Normal rate and regular rhythm.     Pulses: Normal pulses.     Heart sounds: Normal heart sounds.  Pulmonary:     Effort: Pulmonary effort is normal. No respiratory distress.     Breath sounds: Examination of the right-upper field reveals wheezing. Examination of the left-upper field reveals wheezing. Examination  of the right-middle field reveals wheezing. Examination of the left-middle field reveals wheezing. Examination of the right-lower field reveals wheezing. Examination of the left-lower field reveals wheezing. Wheezing present.  Abdominal:     General: Bowel sounds are normal. There is no distension.     Palpations: Abdomen is soft. There is no mass.     Tenderness: There is no abdominal tenderness. There is no guarding.     Hernia: No hernia is present.  Musculoskeletal:     Right lower leg: Edema present.     Left lower leg: Edema present.  Skin:    General: Skin is warm.  Neurological:     General: No focal deficit present.     Mental Status: She is alert and oriented to person, place, and time.     Cranial Nerves: Cranial nerves 2-12 are intact.     Motor: Motor function is intact.  Psychiatric:        Attention and Perception: Attention normal.        Mood and Affect: Mood normal.        Speech: Speech normal.        Behavior: Behavior normal. Behavior is cooperative.        Cognition and Memory: Cognition normal.      Labs on Admission: I have personally reviewed following labs and imaging studies BMET Recent Labs  Lab 05/14/22 1842  NA 140  K 4.1  CL 105  CO2  25  BUN 17  CREATININE 1.05*  GLUCOSE 109*   Electrolytes Recent Labs  Lab 05/14/22 1842  CALCIUM 9.5   Sepsis Markers No results for input(s): "LATICACIDVEN", "PROCALCITON", "O2SATVEN" in the last 168 hours. ABG No results for input(s): "PHART", "PCO2ART", "PO2ART" in the last 168 hours. Liver Enzymes Recent Labs  Lab 05/14/22 1842  AST 22  ALT 18  ALKPHOS 115  BILITOT 0.4  ALBUMIN 4.3   Cardiac Enzymes No results for input(s): "TROPONINI", "PROBNP" in the last 168 hours. No results found for: "DDIMER" Coag's No results for input(s): "APTT", "INR" in the last 168 hours.  No results found for this or any previous visit (from the past 240 hour(s)).   Current Facility-Administered Medications:    acetaminophen (TYLENOL) tablet 650 mg, 650 mg, Oral, Q6H PRN **OR** acetaminophen (TYLENOL) suppository 650 mg, 650 mg, Rectal, Q6H PRN, Allena Katz, Nasri Boakye V, MD   albuterol (PROVENTIL) (2.5 MG/3ML) 0.083% nebulizer solution 2.5 mg, 2.5 mg, Nebulization, Q2H PRN, Allena Katz, Eliezer Mccoy, MD   albuterol (VENTOLIN HFA) 108 (90 Base) MCG/ACT inhaler 2 puff, 2 puff, Inhalation, Q6H PRN, Gertha Calkin, MD   atorvastatin (LIPITOR) tablet 40 mg, 40 mg, Oral, QHS, Jaylon Grode V, MD   bisacodyl (DULCOLAX) EC tablet 5 mg, 5 mg, Oral, Daily PRN, Gertha Calkin, MD   [START ON 05/15/2022] carvedilol (COREG) tablet 3.125 mg, 3.125 mg, Oral, BID WC, Caison Hearn V, MD   docusate sodium (COLACE) capsule 100 mg, 100 mg, Oral, BID, Allena Katz, Dilyn Osoria V, MD   furosemide (LASIX) injection 20 mg, 20 mg, Intravenous, Q12H, Arynn Armand V, MD   guaiFENesin (MUCINEX) 12 hr tablet 600 mg, 600 mg, Oral, BID PRN, Irena Cords V, MD   heparin injection 5,000 Units, 5,000 Units, Subcutaneous, Q8H, Hinata Diener V, MD   hydrALAZINE (APRESOLINE) injection 5 mg, 5 mg, Intravenous, Q4H PRN, Gertha Calkin, MD   HYDROcodone-acetaminophen (NORCO/VICODIN) 5-325 MG per tablet 1 tablet, 1 tablet, Oral, Q6H PRN, Gertha Calkin, MD    HYDROcodone-acetaminophen (  NORCO/VICODIN) 5-325 MG per tablet 1 tablet, 1 tablet, Oral, Q6H PRN, Gertha Calkin, MD   HYDROcodone-acetaminophen (NORCO/VICODIN) 5-325 MG per tablet 1-2 tablet, 1-2 tablet, Oral, Q4H PRN, Gertha Calkin, MD   hydrOXYzine (ATARAX) tablet 50 mg, 50 mg, Oral, BID PRN, Gertha Calkin, MD   [START ON 05/15/2022] insulin aspart (novoLOG) injection 0-15 Units, 0-15 Units, Subcutaneous, TID WC, Phylis Javed V, MD   ipratropium-albuterol (DUONEB) 0.5-2.5 (3) MG/3ML nebulizer solution 3 mL, 3 mL, Nebulization, Once, Minna Antis, MD   ipratropium-albuterol (DUONEB) 0.5-2.5 (3) MG/3ML nebulizer solution 3 mL, 3 mL, Nebulization, Q6H PRN, Allena Katz, Eliezer Mccoy, MD   ipratropium-albuterol (DUONEB) 0.5-2.5 (3) MG/3ML nebulizer solution 3 mL, 3 mL, Nebulization, Q6H, Erasmo Vertz, Eliezer Mccoy, MD   lactated ringers infusion, , Intravenous, Continuous, Allena Katz, Eliezer Mccoy, MD   magnesium sulfate IVPB 2 g 50 mL, 2 g, Intravenous, Once, Minna Antis, MD, Last Rate: 50 mL/hr at 05/14/22 2006, 2 g at 05/14/22 2006   methylPREDNISolone sodium succinate (SOLU-MEDROL) 125 mg/2 mL injection 60 mg, 60 mg, Intravenous, Q12H **FOLLOWED BY** [START ON 05/16/2022] predniSONE (DELTASONE) tablet 40 mg, 40 mg, Oral, Q breakfast, Breiona Couvillon, Eliezer Mccoy, MD   mometasone-formoterol (DULERA) 100-5 MCG/ACT inhaler 2 puff, 2 puff, Inhalation, BID, Allena Katz, Yuri Fana V, MD   morphine (PF) 2 MG/ML injection 2 mg, 2 mg, Intravenous, Q2H PRN, Allena Katz, Eliezer Mccoy, MD   naloxone (NARCAN) nasal spray 4 mg/0.1 mL, 1 spray, Nasal, PRN, Gertha Calkin, MD   nicotine (NICODERM CQ - dosed in mg/24 hours) patch 14 mg, 14 mg, Transdermal, Daily, Maila Dukes V, MD   ondansetron (ZOFRAN) tablet 4 mg, 4 mg, Oral, Q6H PRN **OR** ondansetron (ZOFRAN) injection 4 mg, 4 mg, Intravenous, Q6H PRN, Allena Katz, Eliezer Mccoy, MD   pantoprazole (PROTONIX) EC tablet 40 mg, 40 mg, Oral, Daily, Vieva Brummitt, Eliezer Mccoy, MD   pantoprazole (PROTONIX) injection 40 mg, 40 mg, Intravenous, Q12H, Zienna Ahlin  V, MD   polyethylene glycol (MIRALAX / GLYCOLAX) packet 17 g, 17 g, Oral, Daily PRN, Gertha Calkin, MD   potassium chloride SA (KLOR-CON M) CR tablet 20 mEq, 20 mEq, Oral, Daily, Haddy Mullinax V, MD   sodium chloride flush (NS) 0.9 % injection 3 mL, 3 mL, Intravenous, Q12H, Allena Katz, Eliezer Mccoy, MD   spironolactone (ALDACTONE) tablet 25 mg, 25 mg, Oral, Daily, Allena Katz, Eliezer Mccoy, MD   zolpidem (AMBIEN) tablet 5 mg, 5 mg, Oral, QHS PRN, Gertha Calkin, MD  Current Outpatient Medications:    albuterol (PROVENTIL HFA;VENTOLIN HFA) 108 (90 Base) MCG/ACT inhaler, Inhale 2 puffs into the lungs every 6 (six) hours as needed for wheezing or shortness of breath., Disp: 1 Inhaler, Rfl: 2   atorvastatin (LIPITOR) 40 MG tablet, Take 1 tablet (40 mg total) by mouth at bedtime., Disp: 30 tablet, Rfl: 2   carvedilol (COREG) 3.125 MG tablet, Take 1 tablet (3.125 mg total) by mouth 2 (two) times daily with a meal., Disp: 60 tablet, Rfl: 1   furosemide (LASIX) 20 MG tablet, Take 1 tablet (20 mg total) by mouth daily., Disp: 30 tablet, Rfl: 2   HYDROcodone-acetaminophen (NORCO/VICODIN) 5-325 MG tablet, Take 1 tablet by mouth every 6 (six) hours as needed for severe pain. Must last 30 days., Disp: 120 tablet, Rfl: 0   HYDROcodone-acetaminophen (NORCO/VICODIN) 5-325 MG tablet, Take 1 tablet by mouth every 6 (six) hours as needed for severe pain. Must last 30 days., Disp: 120 tablet, Rfl: 0   [START ON 05/17/2022] HYDROcodone-acetaminophen (NORCO/VICODIN) 5-325  MG tablet, Take 1 tablet by mouth every 6 (six) hours as needed for severe pain. Must last 30 days., Disp: 120 tablet, Rfl: 0   hydrOXYzine (ATARAX/VISTARIL) 50 MG tablet, Take 1 tablet (50 mg total) by mouth 2 (two) times daily as needed for anxiety., Disp: 20 tablet, Rfl: 2   ipratropium-albuterol (DUONEB) 0.5-2.5 (3) MG/3ML SOLN, Take 3 mLs by nebulization every 6 (six) hours as needed (wheezing)., Disp: 360 mL, Rfl: 3   mometasone-formoterol (DULERA) 100-5 MCG/ACT AERO, Inhale  2 puffs into the lungs 2 (two) times daily., Disp: 1 Inhaler, Rfl: 2   naloxone (NARCAN) nasal spray 4 mg/0.1 mL, Place 1 spray into the nose as needed for up to 365 doses (for opioid-induced respiratory depresssion). In case of emergency (overdose), spray once into each nostril. If no response within 3 minutes, repeat application and call 911., Disp: 1 each, Rfl: 0   pantoprazole (PROTONIX) 40 MG tablet, Take 40 mg by mouth daily., Disp: , Rfl:    potassium chloride SA (K-DUR,KLOR-CON) 20 MEQ tablet, Take 20 mEq by mouth daily., Disp: , Rfl:    spironolactone (ALDACTONE) 25 MG tablet, Take 25 mg by mouth daily., Disp: , Rfl:   COVID-19 Labs No results for input(s): "DDIMER", "FERRITIN", "LDH", "CRP" in the last 72 hours. Lab Results  Component Value Date   SARSCOV2NAA NEGATIVE 04/18/2021   SARSCOV2NAA NEGATIVE 01/03/2021    Radiological Exams on Admission: DG Chest Portable 1 View  Result Date: 05/14/2022 CLINICAL DATA:  Shortness of breath EXAM: PORTABLE CHEST 1 VIEW COMPARISON:  Radiograph 04/17/2021 FINDINGS: There is a spinal stimulator lead with tip overlying the lower cervical spine. Cardiomediastinal silhouette is unchanged. There is no focal airspace consolidation. There is no pleural effusion. No pneumothorax. There is no acute osseous abnormality. IMPRESSION: No evidence of acute cardiopulmonary disease. Electronically Signed   By: Caprice Renshaw M.D.   On: 05/14/2022 19:00    EKG: Independently reviewed.  Sinus tachycardia 101.    Assessment and Plan: * Respiratory distress Patient presenting in respiratory distress. Initial rate of respirations was high 30s to 40s per report. Patient stabilized with nebulizer therapy and Solu-Medrol. We will obtain a venous blood gas and a dimer to evaluate for need for CT angio of the chest. Differentials include congestive heart failure, COPD exacerbation, bronchitis, pneumonia.  COPD with acute exacerbation (HCC) We will admit patient  with working diagnosis of COPD exacerbation. Admission using COPD exacerbation order set. We will continue using steroids for the next 24 to 48 hours with p.o. tapering. Low threshold for CT of the chest for screening for lung cancer patient's last CT screening was 2 years ago. Continue with Dulera and as needed Ventolin and DuoNebs. Venous blood gas to assess need for CPAP and CTA.  Neck  Anemia Patient anemic since March 2022, currently patient's hemoglobin is 9.7 and we will follow the trend and if stable will defer to primary care physician for anemia evaluation. See trend below:   AKI (acute kidney injury) (HCC) Lab Results  Component Value Date   CREATININE 1.05 (H) 05/14/2022   CREATININE 0.62 04/22/2021   CREATININE 0.64 04/21/2021  Mild AKI attributed to either prerenal etiology or dehydration. May also be from Lasix.    HTN (hypertension) Blood pressure 119/63, pulse (!) 105, temperature 99 F (37.2 C), temperature source Oral, resp. rate (!) 24, SpO2 97 %. Again we will continue atorvastatin, Coreg, Lasix, Aldactone.    Congestive heart failure [facility]) Patient has a history of  congestive heart failure and we will continue her regimen with Lipitor Coreg Aldactone and Lasix. Patient's most recent 2D echocardiogram was in July 2019 results showing:reduced EF combined systolic and diastolic heart failure.  Study Conclusions  - Left ventricle: The cavity size was mildly dilated. Systolic    function was mildly reduced. The estimated ejection fraction was    45%. Hypokinesis of the anteroseptal myocardium. Doppler    parameters are consistent with abnormal left ventricular    relaxation (grade 1 diastolic dysfunction).  - Aortic valve: Valve area (VTI): 2.95 cm^2. Valve area (Vmax):    2.84 cm^2. Valve area (Vmean): 2.85 cm^2.  - Mitral valve: Valve area by continuity equation (using LVOT    flow): 6.25 cm^2.  - Right ventricle: The cavity size was mildly dilated.   Impressions:  - Mild LV systolic dysfunction with wall motion abnormalities    suggestive of CAD. Grade 1 diastolic dysfunction.        DVT prophylaxis:  Heparin    Code Status:  Full code    Family Communication:  Maness, Scottie (Son)  [phone] (Mobile)   Disposition Plan:  Home    Consults called:  None   Admission status: Inpatient        Para Skeans MD [facility]  6 PM- 2 AM. Please contact me via secure Chat 6 PM-2 AM. [phone] ( Pager ) To contact the [facility] Attending or Consulting provider [place] or covering provider during after hours [place], for this patient.   Check the care team in [facility] and look for a) attending/consulting [facility] provider listed and b) the [facility] team listed Log into [email or web address] and use [place]'s universal password to access. If you do not have the password, please contact the hospital operator. Locate the [facility] provider you are looking for under [facility] and page to a number that you can be directly reached. If you still have difficulty reaching the provider, please page the [facility] (Director on Call) for the Hospitalists listed on amion for assistance. [email or web address] 05/14/2022, 8:13 PM

## 2022-05-14 NOTE — Progress Notes (Signed)
Pt was transported to 237 on Bipap without incident. Pt remains on Bipap and is tol well at this time.Report given to unit RT.

## 2022-05-14 NOTE — Assessment & Plan Note (Addendum)
Acute kidney injury ruled out. 

## 2022-05-14 NOTE — Assessment & Plan Note (Addendum)
--  Diffuse loud wheezing  --started on solumedrol and scheduled nebs --pulm consulted, Dr. Karna Christmas Plan: --cont solumedrol  --cont scheduled nebs --Continue supplemental O2 to keep sats between 88-92%, wean as tolerated --BiPAP PRN --cont azithromycin

## 2022-05-14 NOTE — Assessment & Plan Note (Addendum)
-   Continue torsmeide, spironolactone

## 2022-05-14 NOTE — Assessment & Plan Note (Addendum)
Patient presenting in respiratory distress. RR up to 30's, with associated anxiety. Plan: --BiPAP PRN --IV morphine for air hunger to reduce the anxiety

## 2022-05-14 NOTE — ED Provider Notes (Signed)
[facility] Provider Note    Event Date/Time   First MD Initiated Contact with Patient 05/14/22 1824     (approximate)  History   Chief Complaint: Shortness of Breath  HPI  Elizabeth [name] is a 61 y.o. female with a past medical history anxiety, CHF, COPD, hypertension, presents to the emergency department for shortness of breath.  According to the patient over the past several days she has been feeling worse shortness of breath however became acutely worse today.  Patient states she is prescribed oxygen to be used at night if needed.  Denies any oxygen use during the day.  Denies any fever.  Has been coughing has also noticed increased swelling in her lower extremities.  States chest tightness but denies any pain in the chest.  Physical Exam   Triage Vital Signs: ED Triage Vitals  Enc Vitals Group     BP 05/14/22 1818 (!) 151/75     Pulse Rate 05/14/22 1818 (!) 107     Resp 05/14/22 1818 18     Temp 05/14/22 1900 99 F (37.2 C)     Temp Source 05/14/22 1900 Oral     SpO2 05/14/22 1818 97 %     Weight --      Height --      Head Circumference --      Peak Flow --      Pain Score 05/14/22 1819 6     Pain Loc --      Pain Edu? --      Excl. in GC? --     Most recent vital signs: Vitals:   05/14/22 1818 05/14/22 1900  BP: (!) 151/75 127/74  Pulse: (!) 107 88  Resp: 18 (!) 32  Temp:  99 F (37.2 C)  SpO2: 97% 100%    General: Awake, mild distress breathing around 30 to 40 breaths/min sitting upright in bed. CV:  Good peripheral perfusion.  Regular rate and rhythm around 100 bpm Resp:  Moderate tachypnea, moderate expiratory wheeze.  Mild distress. Abd:  No distention.  Soft, nontender.  No rebound or guarding. Other:  2+ lower extreme edema bilaterally.   ED Results / Procedures / Treatments   EKG  EKG viewed and interpreted by myself shows sinus tachycardia 101 bpm with a narrow QRS, normal axis, normal intervals, no concerning  ST changes.  RADIOLOGY  I have personally reviewed the chest x-ray images.  Patient has some slight haziness in the bilateral bases on my evaluation but no significant opacity or pneumothorax noted. Radiology is read the x-ray is essentially negative   MEDICATIONS ORDERED IN ED: Medications  ipratropium-albuterol (DUONEB) 0.5-2.5 (3) MG/3ML nebulizer solution 3 mL (3 mLs Nebulization Given 05/14/22 1837)  ipratropium-albuterol (DUONEB) 0.5-2.5 (3) MG/3ML nebulizer solution 3 mL (3 mLs Nebulization Given 05/14/22 1837)  methylPREDNISolone sodium succinate (SOLU-MEDROL) 125 mg/2 mL injection 125 mg (125 mg Intravenous Given 05/14/22 1837)     IMPRESSION / MDM / ASSESSMENT AND PLAN / ED COURSE  I reviewed the triage vital signs and the nursing notes.  Patient's presentation is most consistent with acute presentation with potential threat to life or bodily function.  Patient presents to the emergency department for shortness of breath worse over the past 3 days acutely worse today.  Patient is in mild distress moderate tachypnea around 30 to 40 breaths/min with expiratory wheeze bilaterally.  Patient has a history of both COPD as well as CHF.  Given the patient's lower extremity  edema could very likely be CHF related however given her expiratory wheeze could also be COPD related.  We will dose DuoNebs dose Solu-Medrol.  We will check labs including CBC chemistry troponin and a BNP.  Patient's chest x-ray does not appear to show any signs of obvious overload.  Differential would also include ACS, viral infection, bronchitis.  Patient's labs have resulted showing mild anemia otherwise normal CBC with normal white blood cell count, reassuring chemistry negative troponin and a BNP of 29.  Patient does have significant lower extremity IMA we will dose 60 mg of IV Lasix.  Patient continues to have shortness of breath, we will dose magnesium patient has received Solu-Medrol as well as 2 DuoNebs we will dose  additional DuoNeb as well.  We will admit to the hospital service for ongoing work-up and treatment.  Patient is improved but still remains tachypneic around 25 to 30 breaths/min.  Improved wheeze but still present.  FINAL CLINICAL IMPRESSION(S) / ED DIAGNOSES   Dyspnea COPD exacerbation CHF   Note:  This document was prepared using Dragon voice recognition software and may include unintentional dictation errors.   Minna Antis, MD 05/14/22 [phone]

## 2022-05-14 NOTE — ED Triage Notes (Addendum)
Patient to ED via POV for SOB. Wheezing noted. Difficulties noted x2 days. Also, c/o CP. Bilateral leg swelling.

## 2022-05-14 NOTE — Assessment & Plan Note (Addendum)
Stable at baseline 

## 2022-05-15 ENCOUNTER — Encounter: Payer: Self-pay | Admitting: Internal Medicine

## 2022-05-15 ENCOUNTER — Inpatient Hospital Stay
Admit: 2022-05-15 | Discharge: 2022-05-15 | Disposition: A | Discharge status: Home / Self Care | Payer: Medicaid Other | Attending: Internal Medicine | Admitting: Internal Medicine

## 2022-05-15 DIAGNOSIS — R0603 Acute respiratory distress: Secondary | ICD-10-CM | POA: Diagnosis not present

## 2022-05-15 DIAGNOSIS — F1190 Opioid use, unspecified, uncomplicated: Secondary | ICD-10-CM | POA: Insufficient documentation

## 2022-05-15 LAB — GLUCOSE, CAPILLARY
Glucose-Capillary: 162 mg/dL — ABNORMAL HIGH (ref 70–99)
Glucose-Capillary: 173 mg/dL — ABNORMAL HIGH (ref 70–99)
Glucose-Capillary: 175 mg/dL — ABNORMAL HIGH (ref 70–99)
Glucose-Capillary: 168 mg/dL — ABNORMAL HIGH (ref 70–99)
Glucose-Capillary: 172 mg/dL — ABNORMAL HIGH (ref 70–99)
Glucose-Capillary: 135 mg/dL — ABNORMAL HIGH (ref 70–99)

## 2022-05-15 LAB — ECHOCARDIOGRAM COMPLETE
AR max vel: 2.44 cm2
AV Area VTI: 2.61 cm2
AV Area mean vel: 2.69 cm2
AV Mean grad: 5 mmHg
AV Peak grad: 10.5 mmHg
Ao pk vel: 1.62 m/s
Area-P 1/2: 4.99 cm2
S' Lateral: 3.17 cm
Weight: 2345.69 oz

## 2022-05-15 LAB — HEMOGLOBIN A1C
Hgb A1c MFr Bld: 5.9 % — ABNORMAL HIGH (ref 4.8–5.6)
Mean Plasma Glucose: 122.63 mg/dL

## 2022-05-15 LAB — HIV ANTIBODY (ROUTINE TESTING W REFLEX): HIV Screen 4th Generation wRfx: NONREACTIVE

## 2022-05-15 MED ORDER — MORPHINE SULFATE (PF) 2 MG/ML IV SOLN
2.0000 mg | Freq: Once | INTRAVENOUS | Status: AC
  Administered 2022-05-15: 2 mg via INTRAVENOUS
  Filled 2022-05-15: qty 1

## 2022-05-15 MED ORDER — HYDROCODONE-ACETAMINOPHEN 5-325 MG PO TABS
1.0000 | ORAL_TABLET | Freq: Four times a day (QID) | ORAL | Status: DC | PRN
  Administered 2022-05-15 – 2022-05-21 (×5): 1 via ORAL
  Filled 2022-05-15 (×6): qty 1

## 2022-05-15 MED ORDER — ORAL CARE MOUTH RINSE
15.0000 mL | OROMUCOSAL | Status: DC | PRN
Start: 2022-05-15 — End: 2022-05-25

## 2022-05-15 MED ORDER — IPRATROPIUM-ALBUTEROL 0.5-2.5 (3) MG/3ML IN SOLN
3.0000 mL | RESPIRATORY_TRACT | Status: DC
  Administered 2022-05-15 – 2022-05-20 (×27): 3 mL via RESPIRATORY_TRACT
  Filled 2022-05-15 (×27): qty 3

## 2022-05-15 MED ORDER — MORPHINE SULFATE (CONCENTRATE) 10 MG/0.5ML PO SOLN
10.0000 mg | ORAL | Status: DC | PRN
  Administered 2022-05-15 – 2022-05-25 (×13): 10 mg via ORAL
  Filled 2022-05-15 (×15): qty 0.5

## 2022-05-15 MED ORDER — ENOXAPARIN SODIUM 40 MG/0.4ML IJ SOSY
40.0000 mg | PREFILLED_SYRINGE | INTRAMUSCULAR | Status: DC
  Administered 2022-05-15 – 2022-05-24 (×10): 40 mg via SUBCUTANEOUS
  Filled 2022-05-15 (×10): qty 0.4

## 2022-05-15 MED ORDER — ENSURE ENLIVE PO LIQD: 237.0000 mL | Freq: Two times a day (BID) | ORAL | Status: DC

## 2022-05-15 MED ORDER — ADULT MULTIVITAMIN W/MINERALS CH
1.0000 | ORAL_TABLET | Freq: Every day | ORAL | Status: DC
  Administered 2022-05-15 – 2022-05-17 (×3): 1 via ORAL
  Filled 2022-05-15 (×3): qty 1

## 2022-05-15 MED ORDER — PERFLUTREN LIPID MICROSPHERE
1.0000 mL | INTRAVENOUS | Status: AC | PRN
  Administered 2022-05-15: 2 mL via INTRAVENOUS

## 2022-05-15 MED ORDER — ORAL CARE MOUTH RINSE
15.0000 mL | OROMUCOSAL | Status: DC
  Administered 2022-05-16 – 2022-05-24 (×24): 15 mL via OROMUCOSAL

## 2022-05-15 MED ORDER — MORPHINE SULFATE (CONCENTRATE) 10 MG/0.5ML PO SOLN
5.0000 mg | ORAL | Status: DC | PRN
  Administered 2022-05-15: 5 mg via ORAL
  Filled 2022-05-15: qty 0.5

## 2022-05-15 NOTE — Progress Notes (Signed)
*  PRELIMINARY RESULTS* Echocardiogram 2D Echocardiogram has been performed.  Elizabeth [name] 05/15/2022, 10:15 AM

## 2022-05-15 NOTE — Assessment & Plan Note (Signed)
--  cont home Norco

## 2022-05-15 NOTE — Plan of Care (Signed)
  Problem: Cardiac: Goal: Ability to achieve and maintain adequate cardiopulmonary perfusion will improve Outcome: Progressing   Problem: Education: Goal: Knowledge of disease or condition will improve Outcome: Progressing Goal: Knowledge of the prescribed therapeutic regimen will improve Outcome: Progressing   Problem: Education: Goal: Ability to demonstrate management of disease process will improve Outcome: Progressing

## 2022-05-15 NOTE — Plan of Care (Signed)
  Problem: Education: Goal: Ability to demonstrate management of disease process will improve Outcome: Progressing Goal: Ability to verbalize understanding of medication therapies will improve Outcome: Progressing Goal: Individualized Educational Video(s) Outcome: Progressing    Problem: Activity: Goal: Capacity to carry out activities will improve Outcome: Progressing   Problem: Cardiac: Goal: Ability to achieve and maintain adequate cardiopulmonary perfusion will improve Outcome: Progressing   Problem: Activity: Goal: Capacity to carry out activities will improve Outcome: Progressing   Problem: Health Behavior/Discharge Planning: Goal: Ability to identify and utilize available resources and services will improve Outcome: Progressing Goal: Ability to manage health-related needs will improve Outcome: Progressing   Problem: Coping: Goal: Level of anxiety will decrease Outcome: Progressing   Problem: Pain Managment: Goal: General experience of comfort will improve Outcome: Progressing

## 2022-05-15 NOTE — Progress Notes (Signed)
PT Cancellation Note  Patient Details Name: Elizabeth [name] MRN: [id number] DOB: [birth year]   Cancelled Treatment:    Reason Eval/Treat Not Completed: Medical issues which prohibited therapy   Brylon Brenning A Kaiana Marion 05/15/2022, 10:13 AM

## 2022-05-15 NOTE — Plan of Care (Signed)
  Problem: Respiratory: Goal: Ability to maintain a clear airway will improve Outcome: Not Progressing Note: Patient goes into distress without bipap applied. Reiterated to patient that she cannot drink and eat while on the bipap.

## 2022-05-15 NOTE — Progress Notes (Signed)
  PROGRESS NOTE    Elizabeth [name]  EVO:[id number] DOB: 06/20/1961 DOA: 05/14/2022 PCP: Lorn Junes, FNP  237A/237A-AA  LOS: 1 day   Brief hospital course: No notes on file  Assessment & Plan: Elizabeth [name] is a 61 y.o. female with medical history significant for COPD on 2-3L at night, CHF, chronic pain syndrome on chronic opiates, HTN who presented with dyspnea.   * Respiratory distress Patient presenting in respiratory distress. RR up to 30's, with associated anxiety. Plan: --BiPAP PRN --liquid morphine for air hunger to reduce the anxiety  COPD with acute exacerbation (HCC) --Diffuse loud wheezing  --started on solumedrol and scheduled nebs Plan: --cont steroid  --cont scheduled nebs --Continue supplemental O2 to keep sats between 88-92%, wean as tolerated --BiPAP PRN   Chronic, continuous use of opioids --cont home Norco  Chronic anemia --Hgb 9's, which is around baseline.   AKI (acute kidney injury) (HCC) Lab Results  Component Value Date   CREATININE 1.05 (H) 05/14/2022   CREATININE 0.62 04/22/2021   CREATININE 0.64 04/21/2021   --monitor Cr   HTN (hypertension) --cont coreg and aldactone    Chronic diastolic CHF (congestive heart failure) (HCC) --current Echo showed systolic function improved from 2019 and is now normal.  Grade 1 DD. --does not appear to have CHF exacerbation. --cont aldactone  --d/c IV lasix    DVT prophylaxis: Lovenox SQ Code Status: Full code  Family Communication: son updated at bedside today Level of care: Progressive Dispo:   The patient is from: home Anticipated d/c is to: home Anticipated d/c date is: 2-3 days   Subjective and Interval History:  Pt complained of chest pain, and being anxious.  Pt was put on BiPAP and was still breathing fast, though not hypoxic, so morphine given for air hunger, which helped.   Objective: Vitals:   05/15/22 0754 05/15/22 1121 05/15/22 1226 05/15/22 1556  BP:  106/63 104/61  113/65  Pulse: 83 81 93 85  Resp: 17 20 (!) 38 17  Temp: 97.9 F (36.6 C) 97.7 F (36.5 C)  97.7 F (36.5 C)  TempSrc: Oral Oral  Oral  SpO2: 94% 96% 95% 94%  Weight:        Intake/Output Summary (Last 24 hours) at 05/15/2022 1844 Last data filed at 05/15/2022 1227 Gross per 24 hour  Intake --  Output 1550 ml  Net -1550 ml   Filed Weights   05/15/22 0500  Weight: 66.5 kg    Examination:   Constitutional: AAOx3 HEENT: conjunctivae and lids normal, EOMI CV: No cyanosis.   RESP: increased RR, diffuse loud wheezing SKIN: warm, dry Neuro: II - XII grossly intact.   Psych: anxious mood and affect.     Data Reviewed: I have personally reviewed labs and imaging studies  Time spent: 50 minutes  Darlin Priestly, MD [facility] If 7PM-7AM, please contact night-coverage 05/15/2022, 6:44 PM

## 2022-05-15 NOTE — Progress Notes (Signed)
Patient arrives unit at 2200. Patient is alert and oriented. She is on a BiPAP machine. Ambulates independently. Per report she is Q4 CBG while NPO. States that she was short of breath at home. Edema +3 in Bilateral extremities. Skin intact. Lasix administered. Son present at bedside. Patient takes all her pills whole with water.

## 2022-05-15 NOTE — Progress Notes (Signed)
OT Cancellation Note  Patient Details Name: Elizabeth [name] MRN: [id number] DOB: 09/05/1961   Cancelled Treatment:    Reason Eval/Treat Not Completed: Medical issues which prohibited therapy. Order received and chart reviewed. Per conversation with RN, pt remains on bipap, requesting to hold therapy services this date. Will re-attempt as pt appropriate.   Kathie Dike, M.S. OTR/L  05/15/22, 9:13 AM  ascom [phone]

## 2022-05-15 NOTE — Progress Notes (Signed)
Initial Nutrition Assessment  DOCUMENTATION CODES:   Obesity unspecified  INTERVENTION:   -Ensure Enlive po BID, each supplement provides 350 kcal and 20 grams of protein -MVI with minerals daily -Liberalize diet to 2 gram sodium for wider variety of meal selections  NUTRITION DIAGNOSIS:   Increased nutrient needs related to chronic illness (COPD) as evidenced by estimated needs.  GOAL:   Patient will meet greater than or equal to 90% of their needs  MONITOR:   PO intake, Supplement acceptance  REASON FOR ASSESSMENT:   Consult Assessment of nutrition requirement/status  ASSESSMENT:   Pt with history of heart failure and COPD admitted with complaints of shortness of breath for 3 days.  Pt admitted with respiratory distress and COPD exacerbation.   Reviewed I/O's: -1 L x 24 hours  UOP: 1 L x 24 hours  Pt currently on bi-pap at time of visit. She reports not feeling well today. RN about to give pain medications. Pt history limited secondary to Bi-pap. No family or supports at bedside at time of visit.   Pt currently on a heart healthy, carb modified diet. No meal completion data available to assess at this time. RD would benefit from addition of oral nutritional supplements secondary to Bi-pap.    Reviewed wt history; no wt loss noted over the past year.   Medications reviewed and include colace, solu-medrol, potassium chloride, and aldactone.   Lab Results  Component Value Date   HGBA1C 5.9 (H) 01/04/2021   PTA DM medications are none.   Labs reviewed: CBGS: 154-162 (inpatient orders for glycemic control are 0-15 units insulin aspart TID with meals).    NUTRITION - FOCUSED PHYSICAL EXAM:  Flowsheet Row Most Recent Value  Orbital Region No depletion  Upper Arm Region No depletion  Thoracic and Lumbar Region No depletion  Buccal Region No depletion  Temple Region No depletion  Clavicle Bone Region No depletion  Clavicle and Acromion Bone Region No depletion   Scapular Bone Region No depletion  Dorsal Hand No depletion  Patellar Region No depletion  Anterior Thigh Region No depletion  Posterior Calf Region No depletion  Edema (RD Assessment) Moderate  Hair Reviewed  Eyes Reviewed  Mouth Reviewed  Skin Reviewed  Nails Reviewed       Diet Order:   Diet Order             Diet heart healthy/carb modified Room service appropriate? Yes; Fluid consistency: Thin  Diet effective now                   EDUCATION NEEDS:   Not appropriate for education at this time  Skin:  Skin Assessment: Reviewed RN Assessment  Last BM:  05/14/22  Height:   Ht Readings from Last 1 Encounters:  03/11/22 4\' 9"  (1.448 m)    Weight:   Wt Readings from Last 1 Encounters:  05/15/22 66.5 kg    Ideal Body Weight:  43.2 kg  BMI:  Body mass index is 31.73 kg/m.  Estimated Nutritional Needs:   Kcal:  1350-1550  Protein:  60-75 grams  Fluid:  > 1.3 L    07/15/22, RD, LDN, CDCES Registered Dietitian II Certified Diabetes Care and Education Specialist Please refer to [facility] for RD and/or RD on-call/weekend/after hours pager

## 2022-05-16 DIAGNOSIS — R0603 Acute respiratory distress: Secondary | ICD-10-CM | POA: Diagnosis not present

## 2022-05-16 LAB — GLUCOSE, CAPILLARY
Glucose-Capillary: 132 mg/dL — ABNORMAL HIGH (ref 70–99)
Glucose-Capillary: 118 mg/dL — ABNORMAL HIGH (ref 70–99)
Glucose-Capillary: 119 mg/dL — ABNORMAL HIGH (ref 70–99)
Glucose-Capillary: 155 mg/dL — ABNORMAL HIGH (ref 70–99)
Glucose-Capillary: 133 mg/dL — ABNORMAL HIGH (ref 70–99)

## 2022-05-16 LAB — BASIC METABOLIC PANEL
Anion gap: 7 (ref 5–15)
BUN: 27 mg/dL — ABNORMAL HIGH (ref 8–23)
CO2: 28 mmol/L (ref 22–32)
Calcium: 9 mg/dL (ref 8.9–10.3)
Chloride: 105 mmol/L (ref 98–111)
Creatinine, Ser: 0.71 mg/dL (ref 0.44–1.00)
GFR, Estimated: >60 mL/min (ref >60)
Glucose, Bld: 127 mg/dL — ABNORMAL HIGH (ref 70–99)
Potassium: 4.4 mmol/L (ref 3.5–5.1)
Sodium: 140 mmol/L (ref 135–145)

## 2022-05-16 LAB — CBC
HCT: 28.8 % — ABNORMAL LOW (ref 36.0–46.0)
Hemoglobin: 8.5 g/dL — ABNORMAL LOW (ref 12.0–15.0)
MCH: 24.7 pg — ABNORMAL LOW (ref 26.0–34.0)
MCHC: 29.5 g/dL — ABNORMAL LOW (ref 30.0–36.0)
MCV: 83.7 fL (ref 80.0–100.0)
Platelets: 298 10*3/uL (ref 150–400)
RBC: 3.44 MIL/uL — ABNORMAL LOW (ref 3.87–5.11)
RDW: 15.4 % (ref 11.5–15.5)
WBC: 10.7 10*3/uL — ABNORMAL HIGH (ref 4.0–10.5)
nRBC: 0 % (ref 0.0–0.2)

## 2022-05-16 LAB — SARS CORONAVIRUS 2 BY RT PCR: SARS Coronavirus 2 by RT PCR: NEGATIVE

## 2022-05-16 LAB — MAGNESIUM: Magnesium: 3 mg/dL — ABNORMAL HIGH (ref 1.7–2.4)

## 2022-05-16 MED ORDER — AZITHROMYCIN 250 MG PO TABS
500.0000 mg | ORAL_TABLET | Freq: Every day | ORAL | Status: DC
  Administered 2022-05-16 – 2022-05-21 (×6): 500 mg via ORAL
  Filled 2022-05-16 (×6): qty 2

## 2022-05-16 NOTE — Progress Notes (Signed)
  PROGRESS NOTE    Elizabeth [name]  NXG:[id number] DOB: 12/15/1960 DOA: 05/14/2022 PCP: Lorn Junes, FNP  237A/237A-AA  LOS: 2 days   Brief hospital course: No notes on file  Assessment & Plan: Elizabeth [name] is a 61 y.o. female with medical history significant for COPD on 2-3L at night, CHF, chronic pain syndrome on chronic opiates, HTN who presented with dyspnea.   * Respiratory distress Patient presenting in respiratory distress. RR up to 30's, with associated anxiety. Plan: --BiPAP PRN --liquid morphine for air hunger to reduce the anxiety  COPD with acute exacerbation (HCC) --Diffuse loud wheezing  --started on solumedrol and scheduled nebs Plan: --cont steroid  --cont scheduled nebs --Continue supplemental O2 to keep sats between 88-92%, wean as tolerated --BiPAP PRN --start azithromycin   Chronic, continuous use of opioids --cont home Norco  Chronic anemia --Hgb 9's, which is around baseline.   AKI (acute kidney injury) (HCC) Lab Results  Component Value Date   CREATININE 1.05 (H) 05/14/2022   CREATININE 0.62 04/22/2021   CREATININE 0.64 04/21/2021   --monitor Cr   HTN (hypertension) --cont coreg and aldactone    Chronic diastolic CHF (congestive heart failure) (HCC) --current Echo showed systolic function improved from 2019 and is now normal.  Grade 1 DD. --does not appear to have CHF exacerbation.  IV lasix d/c'ed. Plan: --cont aldactone     DVT prophylaxis: Lovenox SQ Code Status: Full code  Family Communication:  Level of care: Progressive Dispo:   The patient is from: home Anticipated d/c is to: home Anticipated d/c date is: 2-3 days   Subjective and Interval History:  Pt continued to need BiPAP for respiratory distress.     Objective: Vitals:   05/16/22 1431 05/16/22 1435 05/16/22 1513 05/16/22 1557  BP: 117/65     Pulse: 91 82    Resp: (!) 26 19    Temp:      TempSrc:      SpO2: (!) 83% 96% 100% 98%   Weight:        Intake/Output Summary (Last 24 hours) at 05/16/2022 1845 Last data filed at 05/16/2022 1341 Gross per 24 hour  Intake 90 ml  Output 1400 ml  Net -1310 ml   Filed Weights   05/15/22 0500 05/16/22 0302  Weight: 66.5 kg 67.2 kg    Examination:   Constitutional: NAD, AAOx3 HEENT: conjunctivae and lids normal, EOMI CV: No cyanosis.   RESP: wheezing, coughing, on BiPAP SKIN: warm, dry Neuro: II - XII grossly intact.   Psych: depressed mood and affect.      Data Reviewed: I have personally reviewed labs and imaging studies  Time spent: 35 minutes  Darlin Priestly, MD [facility] If 7PM-7AM, please contact night-coverage 05/16/2022, 6:45 PM

## 2022-05-16 NOTE — Progress Notes (Signed)
OT Cancellation Note  Patient Details Name: Elizabeth [name] MRN: [id number] DOB: 1960-12-09   Cancelled Treatment:    Reason Eval/Treat Not Completed: Medical issues which prohibited therapy (pt still on bipap, unable to tolerate removing per RN. OT will hold no this date,will re-attempt as able and approrpiate)  Oleta Mouse, OTD OTR/L  05/16/22, 11:10 AM

## 2022-05-16 NOTE — Progress Notes (Signed)
PT Cancellation Note  Patient Details Name: Elizabeth [name] MRN: [id number] DOB: Oct 01, 1961   Cancelled Treatment:    Reason Eval/Treat Not Completed: Medical issues which prohibited therapy. Pt still requiring BiPAP to prevent respiratory distress. Unable to tolerate off of BiPAP at this time.    [name] Elizabeth [name] 05/16/2022, 11:10 AM

## 2022-05-16 NOTE — Progress Notes (Signed)
Patient desat to 77% on bipap at 24%O2, with exertion. Po prn morphine given for increased WOB. Patient unable to recover, called respiratory to see about increasing settings. BIPAP 02 settings increased to 100%. WOB improved, current sat 100%. Respiratory will assess again and adjust settings. Will continue to monitor closely.

## 2022-05-16 NOTE — TOC CM/SW Note (Signed)
CSW acknowledges consult for home health/DME needs. PT/OT evals pending. Will follow up if recommendations are made.  Charlynn Court, CSW [phone]

## 2022-05-16 NOTE — TOC Initial Note (Signed)
Transition of Care [facility]) - Initial/Assessment Note    Patient Details  Name: Elizabeth [name] MRN: [id number] Date of Birth: 01-14-1961  [facility] CM/SW Contact:    Candie Chroman, LCSW Phone Number: 05/16/2022, 11:12 AM  Clinical Narrative:    Readmission prevention screen complete. CSW met with patient. No supports at bedside. CSW introduced role and explained that discharge planning would be discussed. PCP is Hendricks Milo, FNP. Patient drives herself to appointments. Pharmacy is [facility]. No issues obtaining medications. No home health prior to admission. Only DME she has is prn [employer] oxygen but is unsure which agency provides it. She is currently on continuous bipap. No further concerns. CSW encouraged patient to contact CSW as needed. CSW will continue to follow patient for support and facilitate return home once stable. Her son will transport her home at discharge.              Expected Discharge Plan: Home/Self Care Barriers to Discharge: Continued Medical Work up   Patient Goals and CMS Choice        Expected Discharge Plan and Services Expected Discharge Plan: Home/Self Care     Post Acute Care Choice: NA Living arrangements for the past 2 months: Single Family Home                                      Prior Living Arrangements/Services Living arrangements for the past 2 months: Single Family Home   Patient language and need for interpreter reviewed:: Yes Do you feel safe going back to the place where you live?: Yes      Need for Family Participation in Patient Care: Yes (Comment)   Current home services: DME Criminal Activity/Legal Involvement Pertinent to Current Situation/Hospitalization: No - Comment as needed  Activities of Daily Living Home Assistive Devices/Equipment: Dentures (specify type) (uppers) ADL Screening (condition at time of admission) Patient's cognitive ability adequate to safely complete daily activities?: Yes Is  the patient deaf or have difficulty hearing?: No Does the patient have difficulty seeing, even when wearing glasses/contacts?: No Does the patient have difficulty concentrating, remembering, or making decisions?: No Patient able to express need for assistance with ADLs?: Yes Does the patient have difficulty dressing or bathing?: No Independently performs ADLs?: No Communication: Independent Dressing (OT): Independent Grooming: Needs assistance Is this a change from baseline?: Pre-admission baseline Feeding: Independent Bathing: Independent Toileting: Independent In/Out Bed: Independent Walks in Home: Independent with device (comment) Does the patient have difficulty walking or climbing stairs?: No Weakness of Legs: None Weakness of Arms/Hands: None  Permission Sought/Granted                  Emotional Assessment Appearance:: Appears stated age Attitude/Demeanor/Rapport: Engaged, Gracious Affect (typically observed): Accepting, Appropriate, Calm, Pleasant Orientation: : Oriented to Self, Oriented to Place, Oriented to  Time, Oriented to Situation Alcohol / Substance Use: Not Applicable Psych Involvement: No (comment)  Admission diagnosis:  Respiratory distress [R06.03] COPD exacerbation (HCC) [J44.1] Patient Active Problem List   Diagnosis Date Noted   Chronic, continuous use of opioids 05/15/2022   AKI (acute kidney injury) ([place]) 05/14/2022   Chronic anemia 05/14/2022   COPD with acute exacerbation ([place]) 04/17/2021   Chronic use of opiate for therapeutic purpose 02/19/2021   History of tobacco abuse 01/03/2021   Complex regional pain syndrome type 1 of lower extremities (Bilateral) 11/20/2020   Complex regional  pain syndrome type 1 of upper extremities (Bilateral) 05/16/2019   Pharmacologic therapy 05/16/2019   Disorder of skeletal system 05/16/2019   Problems influencing health status 05/16/2019   Palliative care by specialist 04/07/2019   Respiratory distress  04/07/2019   HTN (hypertension) 08/24/2018   COPD (chronic obstructive pulmonary disease) ([place]) 08/24/2018   Lumbar spondylosis 08/22/2018   Disorder of bone, unspecified 08/30/2017   Opioid-induced constipation (OIC) 09/15/2016   Lumbar facet syndrome (Bilateral) 03/18/2016   Insomnia secondary to chronic pain 12/16/2015   Chronic lower extremity pain (1ry area of Pain) (Bilateral) 09/23/2015   Long term prescription opiate use 09/23/2015   Goals of care, counseling/discussion 09/23/2015   Elevated sedimentation rate 09/23/2015   Chronic low back pain (Bilateral) w/o sciatica 07/25/2015   Generalized anxiety disorder 07/25/2015   Encounter for therapeutic drug level monitoring 07/25/2015   Long term current use of opiate analgesic 03/55/9741   Uncomplicated opioid dependence ([place]) 07/25/2015   Opiate use (30 MME/Day) 07/25/2015   Chronic pain syndrome 07/25/2015   Opiate analgesic use agreement exists 07/25/2015   Presence of spinal cord stimulator 07/25/2015   Chronic diastolic CHF (congestive heart failure) ([place]) 07/25/2015   Vitamin D deficiency 07/25/2015   Vitamin B12 deficiency 07/25/2015   PCP:  Bunnie Pion, FNP Pharmacy:   [facility], [place] [address] Phone: [phone] Fax: [phone]     Social Determinants of Health (SDOH) Interventions    Readmission Risk Interventions    05/16/2022   11:11 AM 04/22/2021   10:21 AM  Readmission Risk Prevention Plan  Transportation Screening Complete Complete  PCP or Specialist Appt within 3-5 Days Complete Complete  HRI or Home Care Consult  Complete  Social Work Consult for [place] Planning/Counseling Complete Complete  Palliative Care Screening Not Applicable Not Applicable  Medication Review Press photographer) Complete Referral to Pharmacy

## 2022-05-17 DIAGNOSIS — R0603 Acute respiratory distress: Secondary | ICD-10-CM | POA: Diagnosis not present

## 2022-05-17 LAB — CBC
HCT: 27.7 % — ABNORMAL LOW (ref 36.0–46.0)
Hemoglobin: 8.1 g/dL — ABNORMAL LOW (ref 12.0–15.0)
MCH: 24.6 pg — ABNORMAL LOW (ref 26.0–34.0)
MCHC: 29.2 g/dL — ABNORMAL LOW (ref 30.0–36.0)
MCV: 84.2 fL (ref 80.0–100.0)
Platelets: 275 10*3/uL (ref 150–400)
RBC: 3.29 MIL/uL — ABNORMAL LOW (ref 3.87–5.11)
RDW: 15.6 % — ABNORMAL HIGH (ref 11.5–15.5)
WBC: 8.2 10*3/uL (ref 4.0–10.5)
nRBC: 0 % (ref 0.0–0.2)

## 2022-05-17 LAB — BASIC METABOLIC PANEL
Anion gap: 7 (ref 5–15)
BUN: 33 mg/dL — ABNORMAL HIGH (ref 8–23)
CO2: 26 mmol/L (ref 22–32)
Calcium: 8.8 mg/dL — ABNORMAL LOW (ref 8.9–10.3)
Chloride: 107 mmol/L (ref 98–111)
Creatinine, Ser: 0.71 mg/dL (ref 0.44–1.00)
GFR, Estimated: >60 mL/min (ref >60)
Glucose, Bld: 105 mg/dL — ABNORMAL HIGH (ref 70–99)
Potassium: 4.4 mmol/L (ref 3.5–5.1)
Sodium: 140 mmol/L (ref 135–145)

## 2022-05-17 LAB — GLUCOSE, CAPILLARY
Glucose-Capillary: 95 mg/dL (ref 70–99)
Glucose-Capillary: 258 mg/dL — ABNORMAL HIGH (ref 70–99)
Glucose-Capillary: 84 mg/dL (ref 70–99)
Glucose-Capillary: 170 mg/dL — ABNORMAL HIGH (ref 70–99)

## 2022-05-17 LAB — MAGNESIUM: Magnesium: 3.1 mg/dL — ABNORMAL HIGH (ref 1.7–2.4)

## 2022-05-17 MED ORDER — STERILE WATER FOR INJECTION IJ SOLN
INTRAMUSCULAR | Status: AC
  Administered 2022-05-17: 1.28 mL via INTRAVENOUS
  Filled 2022-05-17: qty 10

## 2022-05-17 MED ORDER — TORSEMIDE 20 MG PO TABS
20.0000 mg | ORAL_TABLET | Freq: Every day | ORAL | Status: DC
  Administered 2022-05-17 – 2022-05-25 (×9): 20 mg via ORAL
  Filled 2022-05-17 (×9): qty 1

## 2022-05-17 MED ORDER — METHYLPREDNISOLONE SODIUM SUCC 125 MG IJ SOLR
80.0000 mg | INTRAMUSCULAR | Status: DC
  Administered 2022-05-17 – 2022-05-18 (×2): 80 mg via INTRAVENOUS
  Filled 2022-05-17 (×2): qty 2

## 2022-05-17 NOTE — Progress Notes (Signed)
PT Cancellation Note  Patient Details Name: Elizabeth [name] MRN: [id number] DOB: 05-27-1961   Cancelled Treatment:    Reason Eval/Treat Not Completed: Patient not medically ready PT orders received, chart reviewed. Pt noted to still be on bipap with notes stating pt desaturated at times. Pt not appropriate for exertional activity at this time. Will hold PT evaluation until pt is medically appropriate.  Aleda Grana, PT, DPT 05/17/22, 8:24 AM  Sandi Mariscal 05/17/2022, 8:24 AM

## 2022-05-17 NOTE — Progress Notes (Signed)
OT Cancellation Note  Patient Details Name: Elizabeth [name] MRN: [id number] DOB: [birth year]   Cancelled Treatment:    Reason Eval/Treat Not Completed: Medical issues which prohibited therapy (pt still on bipap, unable to tolerate exertional activity per notes and per RN. RN requested to hold on this date. This is the third OT attempt for eval. OT will sign off at this time. Please reconsult when pt is medically ready to participate in OT.)  Oleta Mouse, OTD OTR/L  05/17/22, 11:04 AM

## 2022-05-17 NOTE — Progress Notes (Signed)
Pt was taken off BIPAP to attempt MetaNeb. Pt had increased WOB and was very anxious about getting the Bipap back on. Pt placed back on Bipap and is tol well. SVN given through Bipap.

## 2022-05-17 NOTE — Progress Notes (Signed)
Made Dr. Fran Lowes aware magnesium levels are increasing currently 3.1, calcium level today is 8.8. covid test resulted negative. will continue to monitor.

## 2022-05-17 NOTE — Progress Notes (Signed)
  PROGRESS NOTE    Elizabeth [name]  [id number] DOB: 06/25/1961 DOA: 05/14/2022 PCP: Lorn Junes, FNP  237A/237A-AA  LOS: 3 days   Brief hospital course: No notes on file  Assessment & Plan: Elizabeth [name] is a 61 y.o. female with medical history significant for COPD on 2-3L at night, CHF, chronic pain syndrome on chronic opiates, HTN who presented with dyspnea.   * Respiratory distress Patient presenting in respiratory distress. RR up to 30's, with associated anxiety. Plan: --BiPAP PRN --liquid morphine for air hunger to reduce the anxiety  COPD with acute exacerbation (HCC) --Diffuse loud wheezing  --started on solumedrol and scheduled nebs Plan: --pulm consult today --back on solumedrol 40 mg BID --cont scheduled nebs --Continue supplemental O2 to keep sats between 88-92%, wean as tolerated --BiPAP PRN --cont azithromycin   Chronic, continuous use of opioids --cont home Norco  Chronic anemia --Hgb 9's, which is around baseline.   AKI (acute kidney injury) (HCC) Lab Results  Component Value Date   CREATININE 1.05 (H) 05/14/2022   CREATININE 0.62 04/22/2021   CREATININE 0.64 04/21/2021   --monitor Cr   HTN (hypertension) --cont coreg and aldactone    Chronic diastolic CHF (congestive heart failure) (HCC) --current Echo showed systolic function improved from 2019 and is now normal.  Grade 1 DD. --does not appear to have CHF exacerbation.  IV lasix d/c'ed. Plan: --cont aldactone  --pulm to add torsemide 20 mg daily due to pitting LE edema with pan inspiratory wheezing    DVT prophylaxis: Lovenox SQ Code Status: Full code  Family Communication:  Level of care: Progressive Dispo:   The patient is from: home Anticipated d/c is to: home Anticipated d/c date is: 2-3 days   Subjective and Interval History:  Pt reported no improvement in breathing, preferred to be on BiPAP, but vitals noted to be stable.  Pulm consult  today.   Objective: Vitals:   05/17/22 1107 05/17/22 1200 05/17/22 1514 05/17/22 1559  BP:  (!) 103/58 99/65   Pulse:  65 65 65  Resp:  15 18 19   Temp:      TempSrc:      SpO2: 100% 97% 97% 97%  Weight:        Intake/Output Summary (Last 24 hours) at 05/17/2022 1650 Last data filed at 05/17/2022 1216 Gross per 24 hour  Intake 714 ml  Output 500 ml  Net 214 ml   Filed Weights   05/15/22 0500 05/16/22 0302 05/17/22 0500  Weight: 66.5 kg 67.2 kg 66 kg    Examination:   Constitutional: NAD, AAOx3 HEENT: conjunctivae and lids normal, EOMI CV: No cyanosis.   RESP: on BiPAP SKIN: warm, dry Neuro: II - XII grossly intact.      Data Reviewed: I have personally reviewed labs and imaging studies  Time spent: 35 minutes  07/17/22, MD [facility] If 7PM-7AM, please contact night-coverage 05/17/2022, 4:50 PM

## 2022-05-17 NOTE — Consult Note (Signed)
NAME:  Elizabeth [name], MRN:  [id number], DOB:  01/26/1961, LOS: 3 ADMISSION DATE:  05/14/2022, CONSULTATION DATE:  04/18/21 REFERRING MD: Dr. Fran Lowes, CHIEF COMPLAINT: Acute respiratory distress  Brief Pt Description / Synopsis:  61 year old female admitted with acute respiratory distress due to severe COPD exacerbation requiring BiPAP.  High risk for intubation.  History of Present Illness:  Elizabeth [name] is a 61 year old female with a past medical history as listed below who presented to [facility] ED on 05/14/22 due to shortness of breath and respiratory distress.  Pt was previously in respiratory distress on BiPAP  and requires constant attention from RN and continuous BIPAP use due to ongoing dyspnea despite full scope of therapy.   Has severe pan inspiratory and expiratory wheezing.  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 rate 18, weight 66 kg, SpO2 97 %.    FiO2 (%):  [40 %-60 %] 40 %   Intake/Output Summary (Last 24 hours) at 05/17/2022 1600 Last data filed at 05/17/2022 1216 Gross per 24 hour  Intake 714 ml  Output 500 ml  Net 214 ml   Filed Weights   05/15/22 0500 05/16/22 0302 05/17/22 0500  Weight: 66.5 kg 67.2 kg 66 kg    Examination: General: Acutely ill-appearing female, sitting in bed, with moderate respiratory distress despite being on BiPAP, very anxious HENT: Atraumatic, normocephalic, neck supple, no JVD Lungs: Inspiratory and expiratory wheezing throughout, no rhonchi auscultated, tachypnea with accessory muscle use on BiPAP, even Cardiovascular: Regular rate and rhythm, S1-S2, no murmurs, rubs, gallops, 2+ distal pulses Abdomen: Soft, nontender,  nondistended, no guarding rebound tenderness, bowel sounds positive x4 Extremities: Normal bulk and tone, no deformities, no edema, good peripheral circulation Neuro: Awake, alert and oriented x4, moves all extremities to commands, no focal deficits, speech clear GU: Deferred Skin: Warm and dry.  No obvious rashes, lesions, ulcerations  Resolved Hospital Problem list     Assessment & Plan:   Severe Acute COPD Exacerbation -Supplemental O2 as needed to maintain O2 sats 88 to 92% -BiPAP, wean as tolerated -moderate risk for intubation -Follow intermittent Chest X-ray & ABG as needed -Bronchodilators q4h duoneb -IV Solu-Medrol 40 bid, at this time prednisone PO is not enough for her severe wheezing bilaterally.  -Prn Morphine for air hunger   Chronic combined Systolic & Diastolic CHF without acute exacerbation Hypertension -Continuous cardiac monitoring -Maintain MAP >65 -HS Troponin negative x2 (3 ~ 4) -BNP 67 -Most recent ECHO in 2019 with LVEF 45% and grade 1 Diastolic Dysfunction -will increase diuresis due to pitting LE edema with pan inspiratory wheezing. Currently on aldactone will add torsemide 20 daily   Generalized Anxiety Disorder Chronic Pain Syndrome -Provide supportive care -PRN Ativan/Xanax     Best Practice (right click and "Reselect all SmartList Selections" daily)   Diet/type: NPO DVT prophylaxis: LMWH GI prophylaxis: PPI Lines: N/A Foley:  N/A Code Status:  full code Last date of multidisciplinary goals of care discussion [N/A]    Labs   CBC: Recent Labs  Lab 05/14/22 1842 05/16/22 0551 05/17/22 0447  WBC 8.2 10.7* 8.2  HGB 9.7* 8.5* 8.1*  HCT 32.3* 28.8* 27.7*  MCV 83.5 83.7 84.2  PLT 346 298  275    Basic Metabolic Panel: Recent Labs  Lab 05/14/22 1842 05/16/22 0551 05/17/22 0447  NA 140 140 140  K 4.1 4.4 4.4  CL 105 105 107  CO2 25 28 26   GLUCOSE 109* 127* 105*  BUN 17 27* 33*  CREATININE 1.05* 0.71 0.71  CALCIUM 9.5 9.0  8.8*  MG  --  3.0* 3.1*   GFR: Estimated Creatinine Clearance: 57.8 mL/min (by C-G formula based on SCr of 0.71 mg/dL). Recent Labs  Lab 05/14/22 1842 05/16/22 0551 05/17/22 0447  WBC 8.2 10.7* 8.2    Liver Function Tests: Recent Labs  Lab 05/14/22 1842  AST 22  ALT 18  ALKPHOS 115  BILITOT 0.4  PROT 7.7  ALBUMIN 4.3   No results for input(s): "LIPASE", "AMYLASE" in the last 168 hours. No results for input(s): "AMMONIA" in the last 168 hours.  ABG    Component Value Date/Time   HCO3 26.0 05/14/2022 2013   ACIDBASEDEF 1.6 04/17/2021 1749   O2SAT 93.4 05/14/2022 2013     Coagulation Profile: No results for input(s): "INR", "PROTIME" in the last 168 hours.  Cardiac Enzymes: No results for input(s): "CKTOTAL", "CKMB", "CKMBINDEX", "TROPONINI" in the last 168 hours.  HbA1C: Hgb A1c MFr Bld  Date/Time Value Ref Range Status  05/15/2022 05:01 AM 5.9 (H) 4.8 - 5.6 % Final    Comment:    (NOTE) Pre diabetes:          5.7%-6.4%  Diabetes:              >6.4%  Glycemic control for   <7.0% adults with diabetes   01/04/2021 06:29 AM 5.9 (H) 4.8 - 5.6 % Final    Comment:    (NOTE) Pre diabetes:          5.7%-6.4%  Diabetes:              >6.4%  Glycemic control for   <7.0% adults with diabetes     CBG: Recent Labs  Lab 05/16/22 1649 05/16/22 2324 05/17/22 0652 05/17/22 1214 05/17/22 1513  GLUCAP 155* 133* 95 258* 84    Review of Systems:   Positives in BOLD: Gen: Denies fever, chills, weight change, fatigue, night sweats HEENT: Denies blurred vision, double vision, hearing loss, tinnitus, sinus congestion, rhinorrhea, sore throat, neck stiffness, dysphagia PULM: Denies shortness of breath, cough, sputum production, hemoptysis, wheezing CV: Denies chest tightness, edema, orthopnea, paroxysmal nocturnal dyspnea, palpitations GI: Denies abdominal pain, nausea, vomiting, diarrhea, hematochezia, melena, constipation, change in bowel habits GU: Denies  dysuria, hematuria, polyuria, oliguria, urethral discharge Endocrine: Denies hot or cold intolerance, polyuria, polyphagia or appetite change Derm: Denies rash, dry skin, scaling or peeling skin change Heme: Denies easy bruising, bleeding, bleeding gums Neuro: Denies headache, numbness, weakness, slurred speech, loss of memory or consciousness    Past Medical History:  She,  has a past medical history of Anxiety, Arm pain (07/25/2015), CHF (congestive heart failure) (HCC), Congestive heart failure (HCC) (07/25/2015), COPD (chronic obstructive pulmonary disease) (HCC), Depression, Hypertension, Lower extremity pain (07/25/2015), Reflux, RSD (reflex sympathetic dystrophy), Spinal cord stimulator status, Vitamin B12 deficiency, and Vitamin D deficiency.   Surgical History:   Past Surgical History:  Procedure Laterality Date   SPINAL CORD STIMULATOR IMPLANT     x 2     Social History:   reports that she quit smoking about 4 years ago. Her smoking use included cigarettes. She has a 60.00 pack-year smoking history. She has never used smokeless tobacco. She  reports that she does not drink alcohol and does not use drugs.   Family History:  Her family history includes Cancer in her father; Cirrhosis in her mother.   Allergies Allergies  Allergen Reactions   Lisinopril Anaphylaxis     Home Medications  Prior to Admission medications   Medication Sig Start Date End Date Taking? Authorizing Provider  atorvastatin (LIPITOR) 40 MG tablet Take 1 tablet (40 mg total) by mouth at bedtime. 08/18/18  Yes Gladstone Lighter, MD  carvedilol (COREG) 3.125 MG tablet Take 1 tablet (3.125 mg total) by mouth 2 (two) times daily with a meal. 08/18/18  Yes Gladstone Lighter, MD  cetirizine (ZYRTEC) 10 MG tablet Take 10 mg by mouth daily.   Yes [provider]  furosemide (LASIX) 20 MG tablet Take 1 tablet (20 mg total) by mouth daily. Patient taking differently: Take 20 mg by mouth 2 (two) times  daily. 08/18/18  Yes Gladstone Lighter, MD  HYDROcodone-acetaminophen (NORCO/VICODIN) 5-325 MG tablet Take 1 tablet by mouth every 6 (six) hours as needed for severe pain. Must last 30 days. 03/23/21 04/22/21 Yes Milinda Pointer, MD  mometasone-formoterol (DULERA) 100-5 MCG/ACT AERO Inhale 2 puffs into the lungs 2 (two) times daily. 08/18/18 04/17/21 Yes Gladstone Lighter, MD  pantoprazole (PROTONIX) 40 MG tablet Take 40 mg by mouth daily. 03/19/21  Yes [provider]  potassium chloride SA (K-DUR,KLOR-CON) 20 MEQ tablet Take 20 mEq by mouth daily.   Yes [provider]  spironolactone (ALDACTONE) 25 MG tablet Take 25 mg by mouth daily. 06/29/19 04/17/21 Yes [provider]  albuterol (PROVENTIL HFA;VENTOLIN HFA) 108 (90 Base) MCG/ACT inhaler Inhale 2 puffs into the lungs every 6 (six) hours as needed for wheezing or shortness of breath. 11/26/17   Gladstone Lighter, MD  HYDROcodone-acetaminophen (NORCO/VICODIN) 5-325 MG tablet Take 1 tablet by mouth every 6 (six) hours as needed for severe pain. Must last 30 days. 02/21/21 03/23/21  Milinda Pointer, MD  HYDROcodone-acetaminophen (NORCO/VICODIN) 5-325 MG tablet Take 1 tablet by mouth every 6 (six) hours as needed for severe pain. Must last 30 days. 04/22/21 05/22/21  Milinda Pointer, MD  ipratropium-albuterol (DUONEB) 0.5-2.5 (3) MG/3ML SOLN Take 3 mLs by nebulization every 6 (six) hours as needed (wheezing). 01/06/21   Fritzi Mandes, MD  omeprazole (PRILOSEC) 20 MG capsule Take 20 mg by mouth daily.  Patient not taking: Reported on 04/17/2021    [provider]  ramelteon (ROZEREM) 8 MG tablet Take 8 mg by mouth at bedtime. Patient not taking: Reported on 04/17/2021    [provider]      Ottie Glazier, M.D.  [facility] [place]

## 2022-05-18 DIAGNOSIS — R0603 Acute respiratory distress: Secondary | ICD-10-CM | POA: Diagnosis not present

## 2022-05-18 LAB — CBC
HCT: 29.3 % — ABNORMAL LOW (ref 36.0–46.0)
Hemoglobin: 8.7 g/dL — ABNORMAL LOW (ref 12.0–15.0)
MCH: 24.5 pg — ABNORMAL LOW (ref 26.0–34.0)
MCHC: 29.7 g/dL — ABNORMAL LOW (ref 30.0–36.0)
MCV: 82.5 fL (ref 80.0–100.0)
Platelets: 284 10*3/uL (ref 150–400)
RBC: 3.55 MIL/uL — ABNORMAL LOW (ref 3.87–5.11)
RDW: 15.2 % (ref 11.5–15.5)
WBC: 5.5 10*3/uL (ref 4.0–10.5)
nRBC: 0 % (ref 0.0–0.2)

## 2022-05-18 LAB — BASIC METABOLIC PANEL
Anion gap: 7 (ref 5–15)
BUN: 36 mg/dL — ABNORMAL HIGH (ref 8–23)
CO2: 28 mmol/L (ref 22–32)
Calcium: 8.5 mg/dL — ABNORMAL LOW (ref 8.9–10.3)
Chloride: 105 mmol/L (ref 98–111)
Creatinine, Ser: 0.93 mg/dL (ref 0.44–1.00)
GFR, Estimated: >60 mL/min (ref >60)
Glucose, Bld: 123 mg/dL — ABNORMAL HIGH (ref 70–99)
Potassium: 4.2 mmol/L (ref 3.5–5.1)
Sodium: 140 mmol/L (ref 135–145)

## 2022-05-18 LAB — GLUCOSE, CAPILLARY
Glucose-Capillary: 108 mg/dL — ABNORMAL HIGH (ref 70–99)
Glucose-Capillary: 102 mg/dL — ABNORMAL HIGH (ref 70–99)
Glucose-Capillary: 101 mg/dL — ABNORMAL HIGH (ref 70–99)

## 2022-05-18 LAB — MAGNESIUM: Magnesium: 2.7 mg/dL — ABNORMAL HIGH (ref 1.7–2.4)

## 2022-05-18 MED ORDER — STERILE WATER FOR INJECTION IJ SOLN
INTRAMUSCULAR | Status: AC
  Filled 2022-05-18: qty 10

## 2022-05-18 MED ORDER — MORPHINE SULFATE (PF) 2 MG/ML IV SOLN
2.0000 mg | Freq: Four times a day (QID) | INTRAVENOUS | Status: DC | PRN
  Administered 2022-05-18 – 2022-05-24 (×20): 2 mg via INTRAVENOUS
  Filled 2022-05-18 (×20): qty 1

## 2022-05-18 MED ORDER — LORAZEPAM 2 MG/ML IJ SOLN
1.0000 mg | Freq: Four times a day (QID) | INTRAMUSCULAR | Status: DC | PRN
  Administered 2022-05-18 – 2022-05-21 (×7): 1 mg via INTRAVENOUS
  Filled 2022-05-18 (×7): qty 1

## 2022-05-18 NOTE — Evaluation (Signed)
Physical Therapy Evaluation Patient Details Name: Elizabeth [name] MRN: [id number] DOB: 05/22/1961 Today's Date: 05/18/2022  History of Present Illness  Elizabeth [name] is a 61 y.o. female seen in ed with complaints of sob since 3 days. The pt is prescribed O2 at night PRN, but has been using continuously d/t SOB. Pt reports worsening LE edema. She has a hx of CHF, COPD, RSD, HTN, lisinopril allergy, and vitamin B-12 and D deficiency.  Clinical Impression  The pt was admitted 2/2 respiratory distress and has been on constant BiPAP since admission. RN noted and cleared PT evaluation. Upon entering the room the pt is resting comfortably on BiPAP, 30% FiO2 with O2 at 95%. The pt is agreeable to PT evaluation. The pt presents with bilateral LE weakness, balance deficits, and limited activity tolerance. The pt is able to tolerate static standing x10s and marching in place at bedside. Following mobility the pt has coughing fit that requires ~2 min to recover. Upon recover the pt has oxygen saturation at 99% with no significant spike in HR. The pt is left supine in bed with all needs within reach. Prior to admission pt reports independence with mobility and ADLs. At this time the pt would greatly benefit from STR in order to optimize functional mobility and to address deficits listed above. PT will continue to follow.        Recommendations for follow up therapy are one component of a multi-disciplinary discharge planning process, led by the attending physician.  Recommendations may be updated based on patient status, additional functional criteria and insurance authorization.  Follow Up Recommendations Skilled nursing-short term rehab (<3 hours/day) Can patient physically be transported by private vehicle: No    Assistance Recommended at Discharge Intermittent Supervision/Assistance  Patient can return home with the following  A little help with walking and/or transfers;A little help with  bathing/dressing/bathroom;Assistance with cooking/housework;Assist for transportation;Help with stairs or ramp for entrance    Equipment Recommendations Rolling walker (2 wheels)  Recommendations for Other Services       Functional Status Assessment Patient has had a recent decline in their functional status and demonstrates the ability to make significant improvements in function in a reasonable and predictable amount of time.     Precautions / Restrictions Precautions Precautions: Fall Restrictions Weight Bearing Restrictions: No      Mobility  Bed Mobility Overal bed mobility: Needs Assistance Bed Mobility: Supine to Sit     Supine to sit: HOB elevated, Min guard       Patient Response: Anxious, Cooperative (Some anxiety noted during coughing fit, but relaxation techniques utilized to calm patient and slow breathing.)  Transfers Overall transfer level: Needs assistance Equipment used: 1 person hand held assist Transfers: Sit to/from Stand Sit to Stand: Min assist           General transfer comment: Some shaking noted in bilateral LE during static standing x10 sec.    Ambulation/Gait               General Gait Details: Marching in place x8, pt requiring assistance for lateral weight shifting.  Stairs            Wheelchair Mobility    Modified Rankin (Stroke Patients Only)       Balance Overall balance assessment: Needs assistance Sitting-balance support: Feet supported, Bilateral upper extremity supported Sitting balance-Leahy Scale: Fair     Standing balance support: Bilateral upper extremity supported Standing balance-Leahy Scale: Fair  Pertinent Vitals/Pain Pain Assessment Pain Assessment: 0-10 Pain Score: 5  Pain Location: chest and back Pain Descriptors / Indicators: Aching, Sore Pain Intervention(s): Limited activity within patient's tolerance, Patient requesting pain meds-RN notified,  Utilized relaxation techniques    Home Living Family/patient expects to be discharged to:: Private residence Living Arrangements: Children Available Help at Discharge: Family;Available PRN/intermittently Type of Home: House Home Access: Stairs to enter Entrance Stairs-Rails: Can reach both;Right;Left Entrance Stairs-Number of Steps: 5   Home Layout: One level        Prior Function Prior Level of Function : Independent/Modified Independent                     Hand Dominance   Dominant Hand: Right    Extremity/Trunk Assessment   Upper Extremity Assessment Upper Extremity Assessment: Overall WFL for tasks assessed    Lower Extremity Assessment Lower Extremity Assessment: RLE deficits/detail;LLE deficits/detail RLE Deficits / Details: Able to move extremity against gravity, however presenting with quadricep and hamstring weakness. LLE Deficits / Details: Able to move extremity against gravity, however presenting with quadricep and hamstring weakness.    Cervical / Trunk Assessment Cervical / Trunk Assessment: Back Surgery  Communication   Communication: No difficulties  Cognition Arousal/Alertness: Awake/alert Behavior During Therapy: WFL for tasks assessed/performed Overall Cognitive Status: Within Functional Limits for tasks assessed                                          General Comments      Exercises     Assessment/Plan    PT Assessment Patient needs continued PT services  PT Problem List Decreased strength;Decreased mobility;Decreased balance;Decreased activity tolerance;Cardiopulmonary status limiting activity       PT Treatment Interventions Therapeutic activities;Cognitive remediation;Gait training;Therapeutic exercise;Stair training;Balance training;Functional mobility training;Patient/family education;DME instruction    PT Goals (Current goals can be found in the Care Plan section)  Acute Rehab PT Goals Patient Stated  Goal: to return home, breathe better PT Goal Formulation: With patient Time For Goal Achievement: 06/01/22 Potential to Achieve Goals: Good    Frequency Min 2X/week     Co-evaluation               AM-PAC PT "6 Clicks" Mobility  Outcome Measure Help needed turning from your back to your side while in a flat bed without using bedrails?: None Help needed moving from lying on your back to sitting on the side of a flat bed without using bedrails?: A Little Help needed moving to and from a bed to a chair (including a wheelchair)?: A Little Help needed standing up from a chair using your arms (e.g., wheelchair or bedside chair)?: A Little Help needed to walk in hospital room?: A Lot Help needed climbing 3-5 steps with a railing? : A Lot 6 Click Score: 17    End of Session Equipment Utilized During Treatment: Oxygen Activity Tolerance: Patient tolerated treatment well Patient left: in bed;with call bell/phone within reach Nurse Communication: Mobility status PT Visit Diagnosis: Unsteadiness on feet (R26.81);Muscle weakness (generalized) (M62.81);Difficulty in walking, not elsewhere classified (R26.2)    Time: 3545-6256 PT Time Calculation (min) (ACUTE ONLY): 24 min   Charges:   PT Evaluation $PT Eval Moderate Complexity: 1 Mod PT Treatments $Gait Training: 8-22 mins        10:25 AM, 05/18/22 Elizabeth [name] PT, DPT Physical Therapist - [place]  [facility]   Elizabeth [name] 05/18/2022, 10:20 AM

## 2022-05-18 NOTE — Progress Notes (Signed)
  PROGRESS NOTE    Elizabeth [name]  PFX:[id number] DOB: October 02, 1961 DOA: 05/14/2022 PCP: Lorn Junes, FNP  [id number]  LOS: 4 days   Brief hospital course: No notes on file  Assessment & Plan: Elizabeth [name] is a 61 y.o. female with medical history significant for COPD on 2-3L at night, CHF, chronic pain syndrome on chronic opiates, HTN who presented with dyspnea.   * Respiratory distress Patient presenting in respiratory distress. RR up to 30's, with associated anxiety. Plan: --BiPAP PRN --IV morphine for air hunger to reduce the anxiety  COPD with acute exacerbation (HCC) --Diffuse loud wheezing  --started on solumedrol and scheduled nebs --pulm consulted, Dr. Karna Christmas Plan: --cont solumedrol  --cont scheduled nebs --Continue supplemental O2 to keep sats between 88-92%, wean as tolerated --BiPAP PRN --cont azithromycin   Chronic, continuous use of opioids --cont home Norco  Chronic anemia --Hgb 9's, which is around baseline.   AKI (acute kidney injury) (HCC) Lab Results  Component Value Date   CREATININE 1.05 (H) 05/14/2022   CREATININE 0.62 04/22/2021   CREATININE 0.64 04/21/2021   --monitor Cr   HTN (hypertension) --cont coreg and aldactone    Chronic diastolic CHF (congestive heart failure) (HCC) --current Echo showed systolic function improved from 2019 and is now normal.  Grade 1 DD. --does not appear to have CHF exacerbation.  IV lasix d/c'ed. Plan: --cont aldactone  --cont torsemide 20 mg daily per pulm   DVT prophylaxis: Lovenox SQ Code Status: Full code  Family Communication:  Level of care: Progressive Dispo:   The patient is from: home Anticipated d/c is to: home Anticipated d/c date is: 2-3 days   Subjective and Interval History:  Pt doesn't want to get off BiPAP, and said only IV morphine helps with her anxiety so she can remove BiPAP and eat.   Objective: Vitals:   05/18/22 1214 05/18/22 1513 05/18/22 1525  05/18/22 1945  BP:  109/64  122/62  Pulse: 70 75 73 66  Resp:  18 (!) 22 19  Temp:  97.7 F (36.5 C)  97.6 F (36.4 C)  TempSrc:    Oral  SpO2: 98% 99% 99% 97%  Weight:        Intake/Output Summary (Last 24 hours) at 05/18/2022 1952 Last data filed at 05/18/2022 1946 Gross per 24 hour  Intake 243 ml  Output 1400 ml  Net -1157 ml   Filed Weights   05/16/22 0302 05/17/22 0500 05/18/22 0416  Weight: 67.2 kg 66 kg 63.7 kg    Examination:   Constitutional: NAD, AAOx3 HEENT: conjunctivae and lids normal, EOMI CV: No cyanosis.   RESP: diffuse wheezing, on BiPAP Neuro: II - XII grossly intact.      Data Reviewed: I have personally reviewed labs and imaging studies  Time spent: 35 minutes  Darlin Priestly, MD [facility] If 7PM-7AM, please contact night-coverage 05/18/2022, 7:52 PM

## 2022-05-18 NOTE — TOC Progression Note (Signed)
Transition of Care [facility]) - Progression Note    Patient Details  Name: Thetis Schwimmer MRN: [id number] Date of Birth: 11-Oct-1961  Transition of Care [facility]) CM/SW [place], LCSW Phone Number: 05/18/2022, 1:40 PM  Clinical Narrative:  Met with patient to discuss PT recommendations. Patient is not interested in SNF placement. Encouraged her to notify her RN if she changes her mind.  Expected Discharge Plan: Home/Self Care Barriers to Discharge: Continued Medical Work up  Expected Discharge Plan and Services Expected Discharge Plan: Home/Self Care     Post Acute Care Choice: NA Living arrangements for the past 2 months: Single Family Home                                       Social Determinants of Health (SDOH) Interventions    Readmission Risk Interventions    05/16/2022   11:11 AM 04/22/2021   10:21 AM  Readmission Risk Prevention Plan  Transportation Screening Complete Complete  PCP or Specialist Appt within 3-5 Days Complete Complete  HRI or Home Care Consult  Complete  Social Work Consult for [place] Planning/Counseling Complete Complete  Palliative Care Screening Not Applicable Not Applicable  Medication Review [employer]) Complete Referral to Pharmacy

## 2022-05-19 DIAGNOSIS — R0603 Acute respiratory distress: Secondary | ICD-10-CM | POA: Diagnosis not present

## 2022-05-19 LAB — BASIC METABOLIC PANEL
Anion gap: 14 (ref 5–15)
BUN: 41 mg/dL — ABNORMAL HIGH (ref 8–23)
CO2: 26 mmol/L (ref 22–32)
Calcium: 8.9 mg/dL (ref 8.9–10.3)
Chloride: 101 mmol/L (ref 98–111)
Creatinine, Ser: 0.99 mg/dL (ref 0.44–1.00)
GFR, Estimated: >60 mL/min (ref >60)
Glucose, Bld: 149 mg/dL — ABNORMAL HIGH (ref 70–99)
Potassium: 3.8 mmol/L (ref 3.5–5.1)
Sodium: 141 mmol/L (ref 135–145)

## 2022-05-19 LAB — GLUCOSE, CAPILLARY
Glucose-Capillary: 122 mg/dL — ABNORMAL HIGH (ref 70–99)
Glucose-Capillary: 131 mg/dL — ABNORMAL HIGH (ref 70–99)
Glucose-Capillary: 135 mg/dL — ABNORMAL HIGH (ref 70–99)
Glucose-Capillary: 149 mg/dL — ABNORMAL HIGH (ref 70–99)
Glucose-Capillary: 159 mg/dL — ABNORMAL HIGH (ref 70–99)
Glucose-Capillary: 174 mg/dL — ABNORMAL HIGH (ref 70–99)

## 2022-05-19 LAB — CBC
HCT: 32.6 % — ABNORMAL LOW (ref 36.0–46.0)
Hemoglobin: 9.6 g/dL — ABNORMAL LOW (ref 12.0–15.0)
MCH: 25.1 pg — ABNORMAL LOW (ref 26.0–34.0)
MCHC: 29.4 g/dL — ABNORMAL LOW (ref 30.0–36.0)
MCV: 85.1 fL (ref 80.0–100.0)
Platelets: 336 10*3/uL (ref 150–400)
RBC: 3.83 MIL/uL — ABNORMAL LOW (ref 3.87–5.11)
RDW: 15.2 % (ref 11.5–15.5)
WBC: 6.6 10*3/uL (ref 4.0–10.5)
nRBC: 0.3 % — ABNORMAL HIGH (ref 0.0–0.2)

## 2022-05-19 LAB — MAGNESIUM: Magnesium: 2.7 mg/dL — ABNORMAL HIGH (ref 1.7–2.4)

## 2022-05-19 MED ORDER — METHYLPREDNISOLONE SODIUM SUCC 125 MG IJ SOLR
60.0000 mg | INTRAMUSCULAR | Status: DC
  Administered 2022-05-19: 60 mg via INTRAVENOUS
  Filled 2022-05-19: qty 2

## 2022-05-19 NOTE — Progress Notes (Signed)
Patient unable to take medication first thing this morning due to SOB and coughing without Bipap.  Gave patient IV Morphine to help decrease air hunger.    Will try again later this morning

## 2022-05-19 NOTE — Progress Notes (Signed)
NAME:  Elizabeth [name], MRN:  [id number], DOB:  1961/02/26, LOS: 5 ADMISSION DATE:  05/14/2022, CONSULTATION DATE:  04/18/21 REFERRING MD: Dr. Billie Ruddy, CHIEF COMPLAINT: Acute respiratory distress  Brief Pt Description / Synopsis:  61 year old female admitted with acute respiratory distress due to severe COPD exacerbation requiring BiPAP.  High risk for intubation.  History of Present Illness:  Elizabeth [name] is a 61 year old female with a past medical history as listed below who presented to [facility] ED on 05/14/22 due to shortness of breath and respiratory distress.  Pt was previously in respiratory distress on BiPAP  and requires constant attention from RN and continuous BIPAP use due to ongoing dyspnea despite full scope of therapy.   Has severe pan inspiratory and expiratory wheezing.  Pertinent  Medical History  COPD Combined systolic and diastolic CHF (LVEF [id number] on echo in 2019) Hypertension Chronic pain syndrome Anxiety Tobacco abuse  Micro Data:  04/18/2021: SARS-CoV-2 and influenza PCR>> negative  Antimicrobials:  N/A    Objective   Blood pressure 115/64, pulse 66, temperature 98.1 F (36.7 C), resp. rate 18, weight 61.9 kg, SpO2 99 %.    FiO2 (%):  [30 %] 30 %   Intake/Output Summary (Last 24 hours) at 05/19/2022 0805 Last data filed at 05/19/2022 0600 Gross per 24 hour  Intake --  Output 1950 ml  Net -1950 ml    Filed Weights   05/18/22 0416 05/19/22 0439 05/19/22 0602  Weight: 63.7 kg 61.8 kg 61.9 kg    Examination: General: Acutely ill-appearing female, sitting in bed, with moderate respiratory distress despite being on BiPAP, very anxious HENT: Atraumatic, normocephalic, neck supple, no JVD Lungs: Inspiratory and expiratory wheezing throughout, no rhonchi auscultated, tachypnea with accessory muscle use on BiPAP, even Cardiovascular: Regular rate and rhythm, S1-S2, no murmurs, rubs, gallops, 2+ distal pulses Abdomen: Soft, nontender, nondistended, no guarding  rebound tenderness, bowel sounds positive x4 Extremities: Normal bulk and tone, no deformities, no edema, good peripheral circulation Neuro: Awake, alert and oriented x4, moves all extremities to commands, no focal deficits, speech clear GU: Deferred Skin: Warm and dry.  No obvious rashes, lesions, ulcerations  Resolved Hospital Problem list     Assessment & Plan:   Severe Acute COPD Exacerbation -Supplemental O2 as needed to maintain O2 sats 88 to 92% -BiPAP, wean as tolerated -moderate risk for intubation -Follow intermittent Chest X-ray & ABG as needed -Bronchodilators q4h duoneb -IV Solu-Medrol 40 bid, at this time prednisone PO is not enough for her severe wheezing bilaterally.  -Prn Morphine for air hunger   Chronic combined Systolic & Diastolic CHF without acute exacerbation Hypertension -Continuous cardiac monitoring -Maintain MAP >65 -HS Troponin negative x2 (3 ~ 4) -BNP 67 -Most recent ECHO in 2019 with LVEF 45% and grade 1 Diastolic Dysfunction -will increase diuresis due to pitting LE edema with pan inspiratory wheezing. Currently on aldactone will add torsemide 20 daily   Generalized Anxiety Disorder Chronic Pain Syndrome -Provide supportive care -PRN Ativan/Xanax     Best Practice (right click and "Reselect all SmartList Selections" daily)   Diet/type: NPO DVT prophylaxis: LMWH GI prophylaxis: PPI Lines: N/A Foley:  N/A Code Status:  full code Last date of multidisciplinary goals of care discussion [N/A]    Labs   CBC: Recent Labs  Lab 05/14/22 1842 05/16/22 0551 05/17/22 0447 05/18/22 0322 05/19/22 0415  WBC 8.2 10.7* 8.2 5.5 6.6  HGB 9.7* 8.5* 8.1* 8.7* 9.6*  HCT 32.3* 28.8* 27.7* 29.3* 32.6*  MCV 83.5  83.7 84.2 82.5 85.1  PLT 346 298 275 284 336     Basic Metabolic Panel: Recent Labs  Lab 05/14/22 1842 05/16/22 0551 05/17/22 0447 05/18/22 0322 05/19/22 0415  NA 140 140 140 140 141  K 4.1 4.4 4.4 4.2 3.8  CL 105 105 107 105  101  CO2 25 28 26 28 26   GLUCOSE 109* 127* 105* 123* 149*  BUN 17 27* 33* 36* 41*  CREATININE 1.05* 0.71 0.71 0.93 0.99  CALCIUM 9.5 9.0 8.8* 8.5* 8.9  MG  --  3.0* 3.1* 2.7* 2.7*    GFR: Estimated Creatinine Clearance: 45.1 mL/min (by C-G formula based on SCr of 0.99 mg/dL). Recent Labs  Lab 05/16/22 0551 05/17/22 0447 05/18/22 0322 05/19/22 0415  WBC 10.7* 8.2 5.5 6.6     Liver Function Tests: Recent Labs  Lab 05/14/22 1842  AST 22  ALT 18  ALKPHOS 115  BILITOT 0.4  PROT 7.7  ALBUMIN 4.3    No results for input(s): "LIPASE", "AMYLASE" in the last 168 hours. No results for input(s): "AMMONIA" in the last 168 hours.  ABG    Component Value Date/Time   HCO3 26.0 05/14/2022 2013   ACIDBASEDEF 1.6 04/17/2021 1749   O2SAT 93.4 05/14/2022 2013     Coagulation Profile: No results for input(s): "INR", "PROTIME" in the last 168 hours.  Cardiac Enzymes: No results for input(s): "CKTOTAL", "CKMB", "CKMBINDEX", "TROPONINI" in the last 168 hours.  HbA1C: Hgb A1c MFr Bld  Date/Time Value Ref Range Status  05/15/2022 05:01 AM 5.9 (H) 4.8 - 5.6 % Final    Comment:    (NOTE) Pre diabetes:          5.7%-6.4%  Diabetes:              >6.4%  Glycemic control for   <7.0% adults with diabetes   01/04/2021 06:29 AM 5.9 (H) 4.8 - 5.6 % Final    Comment:    (NOTE) Pre diabetes:          5.7%-6.4%  Diabetes:              >6.4%  Glycemic control for   <7.0% adults with diabetes     CBG: Recent Labs  Lab 05/18/22 0647 05/18/22 1214 05/18/22 1723 05/19/22 0002 05/19/22 0558  GLUCAP 108* 102* 101* 159* 149*     Review of Systems:   Positives in BOLD: Gen: Denies fever, chills, weight change, fatigue, night sweats HEENT: Denies blurred vision, double vision, hearing loss, tinnitus, sinus congestion, rhinorrhea, sore throat, neck stiffness, dysphagia PULM: Denies shortness of breath, cough, sputum production, hemoptysis, wheezing CV: Denies chest tightness,  edema, orthopnea, paroxysmal nocturnal dyspnea, palpitations GI: Denies abdominal pain, nausea, vomiting, diarrhea, hematochezia, melena, constipation, change in bowel habits GU: Denies dysuria, hematuria, polyuria, oliguria, urethral discharge Endocrine: Denies hot or cold intolerance, polyuria, polyphagia or appetite change Derm: Denies rash, dry skin, scaling or peeling skin change Heme: Denies easy bruising, bleeding, bleeding gums Neuro: Denies headache, numbness, weakness, slurred speech, loss of memory or consciousness    Past Medical History:  She,  has a past medical history of Anxiety, Arm pain (07/25/2015), CHF (congestive heart failure) (HCC), Congestive heart failure (HCC) (07/25/2015), COPD (chronic obstructive pulmonary disease) (HCC), Depression, Hypertension, Lower extremity pain (07/25/2015), Reflux, RSD (reflex sympathetic dystrophy), Spinal cord stimulator status, Vitamin B12 deficiency, and Vitamin D deficiency.   Surgical History:   Past Surgical History:  Procedure Laterality Date   SPINAL CORD STIMULATOR IMPLANT  x 2     Social History:   reports that she quit smoking about 4 years ago. Her smoking use included cigarettes. She has a 60.00 pack-year smoking history. She has never used smokeless tobacco. She reports that she does not drink alcohol and does not use drugs.   Family History:  Her family history includes Cancer in her father; Cirrhosis in her mother.   Allergies Allergies  Allergen Reactions   Lisinopril Anaphylaxis     Home Medications  Prior to Admission medications   Medication Sig Start Date End Date Taking? Authorizing Provider  atorvastatin (LIPITOR) 40 MG tablet Take 1 tablet (40 mg total) by mouth at bedtime. 08/18/18  Yes Enid Baas, MD  carvedilol (COREG) 3.125 MG tablet Take 1 tablet (3.125 mg total) by mouth 2 (two) times daily with a meal. 08/18/18  Yes Enid Baas, MD  cetirizine (ZYRTEC) 10 MG tablet Take 10 mg by  mouth daily.   Yes [provider]  furosemide (LASIX) 20 MG tablet Take 1 tablet (20 mg total) by mouth daily. Patient taking differently: Take 20 mg by mouth 2 (two) times daily. 08/18/18  Yes Enid Baas, MD  HYDROcodone-acetaminophen (NORCO/VICODIN) 5-325 MG tablet Take 1 tablet by mouth every 6 (six) hours as needed for severe pain. Must last 30 days. 03/23/21 04/22/21 Yes Delano Metz, MD  mometasone-formoterol (DULERA) 100-5 MCG/ACT AERO Inhale 2 puffs into the lungs 2 (two) times daily. 08/18/18 04/17/21 Yes Enid Baas, MD  pantoprazole (PROTONIX) 40 MG tablet Take 40 mg by mouth daily. 03/19/21  Yes [provider]  potassium chloride SA (K-DUR,KLOR-CON) 20 MEQ tablet Take 20 mEq by mouth daily.   Yes [provider]  spironolactone (ALDACTONE) 25 MG tablet Take 25 mg by mouth daily. 06/29/19 04/17/21 Yes [provider]  albuterol (PROVENTIL HFA;VENTOLIN HFA) 108 (90 Base) MCG/ACT inhaler Inhale 2 puffs into the lungs every 6 (six) hours as needed for wheezing or shortness of breath. 11/26/17   Enid Baas, MD  HYDROcodone-acetaminophen (NORCO/VICODIN) 5-325 MG tablet Take 1 tablet by mouth every 6 (six) hours as needed for severe pain. Must last 30 days. 02/21/21 03/23/21  Delano Metz, MD  HYDROcodone-acetaminophen (NORCO/VICODIN) 5-325 MG tablet Take 1 tablet by mouth every 6 (six) hours as needed for severe pain. Must last 30 days. 04/22/21 05/22/21  Delano Metz, MD  ipratropium-albuterol (DUONEB) 0.5-2.5 (3) MG/3ML SOLN Take 3 mLs by nebulization every 6 (six) hours as needed (wheezing). 01/06/21   Enedina Finner, MD  omeprazole (PRILOSEC) 20 MG capsule Take 20 mg by mouth daily.  Patient not taking: Reported on 04/17/2021    [provider]  ramelteon (ROZEREM) 8 MG tablet Take 8 mg by mouth at bedtime. Patient not taking: Reported on 04/17/2021    [provider]      Vida Rigger, M.D.  Pulmonary & Critical  Care Medicine  [facility]

## 2022-05-19 NOTE — Progress Notes (Signed)
Attempted to give patient morning medication.  Patient started coughing, respirations increased and O2 sats decreased (on 4.5 L of O2 [place]).  I was able to give patient oral morphine, but unable to give patient the rest of her oral medications.   Will continue to monitor.    Paged MD to ask for ativan to help calm patient down.   Ativan IV ordered

## 2022-05-19 NOTE — Progress Notes (Signed)
Physical Therapy Treatment Patient Details Name: Elizabeth [name] MRN: [id number] DOB: 01/25/1961 Today's Date: 05/19/2022   History of Present Illness Elizabeth [name] is a 61 y.o. female seen in ed with complaints of sob since 3 days. The pt is prescribed O2 at night PRN, but has been using continuously d/t SOB. Pt reports worsening LE edema. She has a hx of CHF, COPD, RSD, HTN, lisinopril allergy, and vitamin B-12 and D deficiency.    PT Comments    Patient alert, agreeable to PT with some encouragement, BIPAP in place throughout session. Pt did report some back pain, referenced a spinal stimulator. Significant portion of session was focused on teaching pt relaxation techniques to address anxiety and elevated RR with minimal success. Supine <> sit with CGA, and sit <> stand twice, CGA as well. Pt most limited by anxiety, spO2 97-100% throughout mobility. She was able to take a few steps towards [facility], but true ambulation deferred due to elevated RR and pt anxiety. Returned to supine with all needs in reach and RN at bedside.       Recommendations for follow up therapy are one component of a multi-disciplinary discharge planning process, led by the attending physician.  Recommendations may be updated based on patient status, additional functional criteria and insurance authorization.  Follow Up Recommendations  Skilled nursing-short term rehab (<3 hours/day) Can patient physically be transported by private vehicle: No   Assistance Recommended at Discharge Intermittent Supervision/Assistance  Patient can return home with the following A little help with walking and/or transfers;A little help with bathing/dressing/bathroom;Assistance with cooking/housework;Assist for transportation;Help with stairs or ramp for entrance   Equipment Recommendations  Rolling walker (2 wheels)    Recommendations for Other Services       Precautions / Restrictions Precautions Precautions:  Fall Restrictions Weight Bearing Restrictions: No     Mobility  Bed Mobility Overal bed mobility: Needs Assistance Bed Mobility: Supine to Sit, Sit to Supine     Supine to sit: HOB elevated, Min guard Sit to supine: Min guard, HOB elevated        Transfers Overall transfer level: Needs assistance Equipment used: None Transfers: Sit to/from Stand Sit to Stand: Min guard           General transfer comment: performed twice, with some encouragement    Ambulation/Gait               General Gait Details: 1-2 steps towards [facility], not true ambulation. Deferred due to pt anxiety and elevated RR.   Stairs             Wheelchair Mobility    Modified Rankin (Stroke Patients Only)       Balance Overall balance assessment: Needs assistance Sitting-balance support: Feet supported, Bilateral upper extremity supported Sitting balance-Leahy Scale: Fair     Standing balance support: Bilateral upper extremity supported Standing balance-Leahy Scale: Fair                              Cognition Arousal/Alertness: Awake/alert Behavior During Therapy: WFL for tasks assessed/performed Overall Cognitive Status: Within Functional Limits for tasks assessed                                          Exercises      General Comments  Pertinent Vitals/Pain Pain Assessment Pain Score: 5  Pain Location: chest and back Pain Descriptors / Indicators: Aching, Sore Pain Intervention(s): Limited activity within patient's tolerance, Utilized relaxation techniques, Monitored during session    Home Living                          Prior Function            PT Goals (current goals can now be found in the care plan section) Progress towards PT goals: Progressing toward goals    Frequency    Min 2X/week      PT Plan Current plan remains appropriate    Co-evaluation              AM-PAC PT "6 Clicks" Mobility    Outcome Measure  Help needed turning from your back to your side while in a flat bed without using bedrails?: None Help needed moving from lying on your back to sitting on the side of a flat bed without using bedrails?: A Little Help needed moving to and from a bed to a chair (including a wheelchair)?: A Little Help needed standing up from a chair using your arms (e.g., wheelchair or bedside chair)?: A Little Help needed to walk in hospital room?: A Lot Help needed climbing 3-5 steps with a railing? : A Lot 6 Click Score: 17    End of Session Equipment Utilized During Treatment: Oxygen Activity Tolerance: Patient tolerated treatment well Patient left: in bed;with call bell/phone within reach Nurse Communication: Mobility status PT Visit Diagnosis: Unsteadiness on feet (R26.81);Muscle weakness (generalized) (M62.81);Difficulty in walking, not elsewhere classified (R26.2)     Time: 7026-3785 PT Time Calculation (min) (ACUTE ONLY): 18 min  Charges:  $Therapeutic Activity: 8-22 mins                     Olga Coaster PT, DPT 12:26 PM,05/19/22

## 2022-05-19 NOTE — Progress Notes (Signed)
  PROGRESS NOTE    Elizabeth [name]  YIR:[id number] DOB: [birth year] DOA: 05/14/2022 PCP: Lorn Junes, FNP  [id number]  LOS: 5 days   Brief hospital course: No notes on file  Assessment & Plan: Elizabeth [name] is a 61 y.o. female with medical history significant for COPD on 2-3L at night, CHF, chronic pain syndrome on chronic opiates, HTN who presented with dyspnea.   * Respiratory distress Patient presenting in respiratory distress. RR up to 30's, with associated anxiety. Plan: --BiPAP PRN --IV morphine for air hunger to reduce the anxiety  COPD with acute exacerbation (HCC) --Diffuse loud wheezing  --started on solumedrol and scheduled nebs --pulm consulted, Dr. Karna Christmas Plan: --cont solumedrol  --cont scheduled nebs --Continue supplemental O2 to keep sats between 88-92%, wean as tolerated --BiPAP PRN --cont azithromycin   Chronic, continuous use of opioids --cont home Norco  Chronic anemia --Hgb 9's, which is around baseline.   AKI (acute kidney injury) (HCC) Lab Results  Component Value Date   CREATININE 1.05 (H) 05/14/2022   CREATININE 0.62 04/22/2021   CREATININE 0.64 04/21/2021   --monitor Cr   HTN (hypertension) --cont coreg and aldactone    Chronic diastolic CHF (congestive heart failure) (HCC) --current Echo showed systolic function improved from 2019 and is now normal.  Grade 1 DD. --does not appear to have CHF exacerbation.  IV lasix d/c'ed. Plan: --cont aldactone  --cont torsemide 20 mg daily per pulm   DVT prophylaxis: Lovenox SQ Code Status: Full code  Family Communication:  Level of care: Progressive Dispo:   The patient is from: home Anticipated d/c is to: home Anticipated d/c date is: 2-3 days   Subjective and Interval History:  Pt continued on BiPAP and became very short of breath and anxious when without it.     Objective: Vitals:   05/19/22 1130 05/19/22 1131 05/19/22 1516 05/19/22 1600  BP:   119/68    Pulse: 64 64 68 73  Resp: (!) 24 (!) 24 19 (!) 22  Temp:   97.9 F (36.6 C)   TempSrc:      SpO2: 100% 100% 99% 100%  Weight:        Intake/Output Summary (Last 24 hours) at 05/19/2022 1922 Last data filed at 05/19/2022 1900 Gross per 24 hour  Intake 0 ml  Output 3550 ml  Net -3550 ml   Filed Weights   05/18/22 0416 05/19/22 0439 05/19/22 0602  Weight: 63.7 kg 61.8 kg 61.9 kg    Examination:   Constitutional: NAD, AAOx3 HEENT: conjunctivae and lids normal, EOMI CV: No cyanosis.   RESP: Loud wheezes and rhonchi, on BiPAP Extremities: No effusions, edema in BLE SKIN: warm, dry Neuro: II - XII grossly intact.   Psych: depressed mood and affect.     Data Reviewed: I have personally reviewed labs and imaging studies  Time spent: 35 minutes  Darlin Priestly, MD [facility] If 7PM-7AM, please contact night-coverage 05/19/2022, 7:22 PM

## 2022-05-20 ENCOUNTER — Encounter: Payer: Self-pay | Admitting: Internal Medicine

## 2022-05-20 DIAGNOSIS — F419 Anxiety disorder, unspecified: Secondary | ICD-10-CM

## 2022-05-20 DIAGNOSIS — Z515 Encounter for palliative care: Secondary | ICD-10-CM

## 2022-05-20 DIAGNOSIS — J9601 Acute respiratory failure with hypoxia: Secondary | ICD-10-CM | POA: Diagnosis not present

## 2022-05-20 DIAGNOSIS — I5032 Chronic diastolic (congestive) heart failure: Secondary | ICD-10-CM

## 2022-05-20 DIAGNOSIS — Z7189 Other specified counseling: Secondary | ICD-10-CM

## 2022-05-20 DIAGNOSIS — G894 Chronic pain syndrome: Secondary | ICD-10-CM

## 2022-05-20 DIAGNOSIS — D649 Anemia, unspecified: Secondary | ICD-10-CM

## 2022-05-20 LAB — BASIC METABOLIC PANEL
Anion gap: 11 (ref 5–15)
BUN: 43 mg/dL — ABNORMAL HIGH (ref 8–23)
CO2: 27 mmol/L (ref 22–32)
Calcium: 8.8 mg/dL — ABNORMAL LOW (ref 8.9–10.3)
Chloride: 101 mmol/L (ref 98–111)
Creatinine, Ser: 0.88 mg/dL (ref 0.44–1.00)
GFR, Estimated: >60 mL/min (ref >60)
Glucose, Bld: 123 mg/dL — ABNORMAL HIGH (ref 70–99)
Potassium: 3.9 mmol/L (ref 3.5–5.1)
Sodium: 139 mmol/L (ref 135–145)

## 2022-05-20 LAB — CBC
HCT: 31.8 % — ABNORMAL LOW (ref 36.0–46.0)
Hemoglobin: 9.5 g/dL — ABNORMAL LOW (ref 12.0–15.0)
MCH: 24.3 pg — ABNORMAL LOW (ref 26.0–34.0)
MCHC: 29.9 g/dL — ABNORMAL LOW (ref 30.0–36.0)
MCV: 81.3 fL (ref 80.0–100.0)
Platelets: 326 10*3/uL (ref 150–400)
RBC: 3.91 MIL/uL (ref 3.87–5.11)
RDW: 14.8 % (ref 11.5–15.5)
WBC: 7.5 10*3/uL (ref 4.0–10.5)
nRBC: 0.4 % — ABNORMAL HIGH (ref 0.0–0.2)

## 2022-05-20 LAB — GLUCOSE, CAPILLARY
Glucose-Capillary: 122 mg/dL — ABNORMAL HIGH (ref 70–99)
Glucose-Capillary: 111 mg/dL — ABNORMAL HIGH (ref 70–99)
Glucose-Capillary: 133 mg/dL — ABNORMAL HIGH (ref 70–99)
Glucose-Capillary: 150 mg/dL — ABNORMAL HIGH (ref 70–99)

## 2022-05-20 LAB — MAGNESIUM: Magnesium: 2.8 mg/dL — ABNORMAL HIGH (ref 1.7–2.4)

## 2022-05-20 MED ORDER — METHYLPREDNISOLONE SODIUM SUCC 40 MG IJ SOLR
40.0000 mg | INTRAMUSCULAR | Status: DC
Start: 2022-05-20 — End: 2022-05-25
  Administered 2022-05-20 – 2022-05-24 (×5): 40 mg via INTRAVENOUS
  Filled 2022-05-20 (×5): qty 1

## 2022-05-20 MED ORDER — IPRATROPIUM-ALBUTEROL 0.5-2.5 (3) MG/3ML IN SOLN
3.0000 mL | Freq: Three times a day (TID) | RESPIRATORY_TRACT | Status: DC
  Administered 2022-05-20 – 2022-05-25 (×14): 3 mL via RESPIRATORY_TRACT
  Filled 2022-05-20 (×14): qty 3

## 2022-05-20 NOTE — Hospital Course (Signed)
Elizabeth [name] is a 61 y.o. female with medical history significant for COPD on 2-3L at night, CHF, chronic pain syndrome on chronic opiates, HTN who presented with dyspnea.

## 2022-05-20 NOTE — Assessment & Plan Note (Signed)
-   Continue morhpine, Vicodin

## 2022-05-20 NOTE — Progress Notes (Signed)
PT Cancellation Note  Patient Details Name: Elizabeth [name] MRN: [id number] DOB: September 10, 1961   Cancelled Treatment:    Reason Eval/Treat Not Completed: Other (comment). Pt sleeping soundly, PT to re-attempt as able.    Olga Coaster 05/20/2022, 1:53 PM

## 2022-05-20 NOTE — Assessment & Plan Note (Signed)
Presented with respiratory distress and tachypnea, stabilized with BiPAP. With BiPAP removed, SpO2 has dropped to 83-88%.

## 2022-05-20 NOTE — Progress Notes (Signed)
NAME:  Elizabeth [name], MRN:  [id number], DOB:  1961-03-12, LOS: 6 ADMISSION DATE:  05/14/2022, CONSULTATION DATE:  04/18/21 REFERRING MD: Dr. Fran Lowes, CHIEF COMPLAINT: Acute respiratory distress  Brief Pt Description / Synopsis:  61 year old female admitted with acute respiratory distress due to severe COPD exacerbation requiring BiPAP.  High risk for intubation.  History of Present Illness:  Elizabeth [name] is a 61 year old female with a past medical history as listed below who presented to [facility] ED on 05/14/22 due to shortness of breath and respiratory distress.  Pt was previously in respiratory distress on BiPAP  and requires constant attention from RN and continuous BIPAP use due to ongoing dyspnea despite full scope of therapy.   Has severe pan inspiratory and expiratory wheezing.  05/19/22 - patient remains on BIPAP with reduced settings.  She feels taht BIPAP allows her to rest and sleep but breathless without it. She is less swollen on aldactone and torsemide with preserved renal function.   05/20/22- reduced steroids to 40mg  IV daily.  She is wheezing less but still on minimal settings with BIPAP.  She does not want to take it off for comfort.  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  Recommend OT today.   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 rate 20, weight 59.5 kg, SpO2 97 %.    FiO2 (%):  [19 %-30 %] 30 %   Intake/Output Summary (Last 24 hours) at 05/20/2022 1145 Last data filed at 05/20/2022 1135 Gross per 24 hour  Intake 0 ml  Output 1900 ml  Net -1900 ml    Filed Weights   05/19/22 0439  05/19/22 0602 05/20/22 0354  Weight: 61.8 kg 61.9 kg 59.5 kg    Examination: General: Acutely ill-appearing female, sitting in bed, with moderate respiratory distress despite being on BiPAP, very anxious HENT: Atraumatic, normocephalic, neck supple, no JVD Lungs: Inspiratory and expiratory wheezing throughout, no rhonchi auscultated, tachypnea with accessory muscle use on BiPAP, even Cardiovascular: Regular rate and rhythm, S1-S2, no murmurs, rubs, gallops, 2+ distal pulses Abdomen: Soft, nontender, nondistended, no guarding rebound tenderness, bowel sounds positive x4 Extremities: Normal bulk and tone, no deformities, no edema, good peripheral circulation Neuro: Awake, alert and oriented x4, moves all extremities to commands, no focal deficits, speech clear GU: Deferred Skin: Warm and dry.  No obvious rashes, lesions, ulcerations  Resolved Hospital Problem list     Assessment & Plan:   Severe Acute COPD Exacerbation -Supplemental O2 as needed to maintain O2 sats 88 to 92% -BiPAP, wean as tolerated -moderate risk for intubation -Follow intermittent Chest X-ray & ABG as needed -Bronchodilators q4h duoneb -IV Solu-Medrol 40 bid, at this time prednisone PO is not enough for her severe wheezing bilaterally.  -Prn Morphine for air hunger   Chronic combined Systolic & Diastolic CHF without acute exacerbation Hypertension -Continuous cardiac monitoring -Maintain MAP >65 -HS Troponin negative x2 (3 ~ 4) -BNP 67 -Most recent ECHO in 2019 with LVEF 45% and grade 1 Diastolic Dysfunction -will increase diuresis due to pitting LE edema with pan inspiratory wheezing. Currently on aldactone will add torsemide 20 daily   Generalized Anxiety Disorder  Chronic Pain Syndrome -Provide supportive care -PRN Ativan/Xanax     Best Practice (right click and "Reselect all SmartList Selections" daily)   Diet/type: NPO DVT prophylaxis: LMWH GI prophylaxis: PPI Lines: N/A Foley:  N/A Code  Status:  full code Last date of multidisciplinary goals of care discussion [N/A]    Labs   CBC: Recent Labs  Lab 05/16/22 0551 05/17/22 0447 05/18/22 0322 05/19/22 0415 05/20/22 0437  WBC 10.7* 8.2 5.5 6.6 7.5  HGB 8.5* 8.1* 8.7* 9.6* 9.5*  HCT 28.8* 27.7* 29.3* 32.6* 31.8*  MCV 83.7 84.2 82.5 85.1 81.3  PLT 298 275 284 336 326     Basic Metabolic Panel: Recent Labs  Lab 05/16/22 0551 05/17/22 0447 05/18/22 0322 05/19/22 0415 05/20/22 0437  NA 140 140 140 141 139  K 4.4 4.4 4.2 3.8 3.9  CL 105 107 105 101 101  CO2 28 26 28 26 27   GLUCOSE 127* 105* 123* 149* 123*  BUN 27* 33* 36* 41* 43*  CREATININE 0.71 0.71 0.93 0.99 0.88  CALCIUM 9.0 8.8* 8.5* 8.9 8.8*  MG 3.0* 3.1* 2.7* 2.7* 2.8*    GFR: Estimated Creatinine Clearance: 49.8 mL/min (by C-G formula based on SCr of 0.88 mg/dL). Recent Labs  Lab 05/17/22 0447 05/18/22 0322 05/19/22 0415 05/20/22 0437  WBC 8.2 5.5 6.6 7.5     Liver Function Tests: Recent Labs  Lab 05/14/22 1842  AST 22  ALT 18  ALKPHOS 115  BILITOT 0.4  PROT 7.7  ALBUMIN 4.3    No results for input(s): "LIPASE", "AMYLASE" in the last 168 hours. No results for input(s): "AMMONIA" in the last 168 hours.  ABG    Component Value Date/Time   HCO3 26.0 05/14/2022 2013   ACIDBASEDEF 1.6 04/17/2021 1749   O2SAT 93.4 05/14/2022 2013     Coagulation Profile: No results for input(s): "INR", "PROTIME" in the last 168 hours.  Cardiac Enzymes: No results for input(s): "CKTOTAL", "CKMB", "CKMBINDEX", "TROPONINI" in the last 168 hours.  HbA1C: Hgb A1c MFr Bld  Date/Time Value Ref Range Status  05/15/2022 05:01 AM 5.9 (H) 4.8 - 5.6 % Final    Comment:    (NOTE) Pre diabetes:          5.7%-6.4%  Diabetes:              >6.4%  Glycemic control for   <7.0% adults with diabetes   01/04/2021 06:29 AM 5.9 (H) 4.8 - 5.6 % Final    Comment:    (NOTE) Pre diabetes:          5.7%-6.4%  Diabetes:              >6.4%  Glycemic  control for   <7.0% adults with diabetes     CBG: Recent Labs  Lab 05/19/22 1126 05/19/22 1618 05/19/22 2052 05/20/22 0749 05/20/22 1133  GLUCAP 122* 131* 135* 122* 111*     Review of Systems:   Positives in BOLD: Gen: Denies fever, chills, weight change, fatigue, night sweats HEENT: Denies blurred vision, double vision, hearing loss, tinnitus, sinus congestion, rhinorrhea, sore throat, neck stiffness, dysphagia PULM: Denies shortness of breath, cough, sputum production, hemoptysis, wheezing CV: Denies chest tightness, edema, orthopnea, paroxysmal nocturnal dyspnea, palpitations GI: Denies abdominal pain, nausea, vomiting, diarrhea, hematochezia, melena, constipation, change in bowel habits GU: Denies dysuria, hematuria, polyuria, oliguria, urethral discharge Endocrine: Denies hot or cold intolerance, polyuria, polyphagia or appetite change Derm: Denies rash, dry skin, scaling or peeling skin change Heme: Denies easy  bruising, bleeding, bleeding gums Neuro: Denies headache, numbness, weakness, slurred speech, loss of memory or consciousness    Past Medical History:  She,  has a past medical history of Anxiety, Arm pain (07/25/2015), CHF (congestive heart failure) (HCC), Congestive heart failure (HCC) (07/25/2015), COPD (chronic obstructive pulmonary disease) (HCC), Depression, Hypertension, Lower extremity pain (07/25/2015), Reflux, RSD (reflex sympathetic dystrophy), Spinal cord stimulator status, Vitamin B12 deficiency, and Vitamin D deficiency.   Surgical History:   Past Surgical History:  Procedure Laterality Date   SPINAL CORD STIMULATOR IMPLANT     x 2     Social History:   reports that she quit smoking about 4 years ago. Her smoking use included cigarettes. She has a 60.00 pack-year smoking history. She has never used smokeless tobacco. She reports that she does not drink alcohol and does not use drugs.   Family History:  Her family history includes Cancer in her  father; Cirrhosis in her mother.   Allergies Allergies  Allergen Reactions   Lisinopril Anaphylaxis     Home Medications  Prior to Admission medications   Medication Sig Start Date End Date Taking? Authorizing Provider  atorvastatin (LIPITOR) 40 MG tablet Take 1 tablet (40 mg total) by mouth at bedtime. 08/18/18  Yes Enid Baas, MD  carvedilol (COREG) 3.125 MG tablet Take 1 tablet (3.125 mg total) by mouth 2 (two) times daily with a meal. 08/18/18  Yes Enid Baas, MD  cetirizine (ZYRTEC) 10 MG tablet Take 10 mg by mouth daily.   Yes [provider]  furosemide (LASIX) 20 MG tablet Take 1 tablet (20 mg total) by mouth daily. Patient taking differently: Take 20 mg by mouth 2 (two) times daily. 08/18/18  Yes Enid Baas, MD  HYDROcodone-acetaminophen (NORCO/VICODIN) 5-325 MG tablet Take 1 tablet by mouth every 6 (six) hours as needed for severe pain. Must last 30 days. 03/23/21 04/22/21 Yes Delano Metz, MD  mometasone-formoterol (DULERA) 100-5 MCG/ACT AERO Inhale 2 puffs into the lungs 2 (two) times daily. 08/18/18 04/17/21 Yes Enid Baas, MD  pantoprazole (PROTONIX) 40 MG tablet Take 40 mg by mouth daily. 03/19/21  Yes [provider]  potassium chloride SA (K-DUR,KLOR-CON) 20 MEQ tablet Take 20 mEq by mouth daily.   Yes [provider]  spironolactone (ALDACTONE) 25 MG tablet Take 25 mg by mouth daily. 06/29/19 04/17/21 Yes [provider]  albuterol (PROVENTIL HFA;VENTOLIN HFA) 108 (90 Base) MCG/ACT inhaler Inhale 2 puffs into the lungs every 6 (six) hours as needed for wheezing or shortness of breath. 11/26/17   Enid Baas, MD  HYDROcodone-acetaminophen (NORCO/VICODIN) 5-325 MG tablet Take 1 tablet by mouth every 6 (six) hours as needed for severe pain. Must last 30 days. 02/21/21 03/23/21  Delano Metz, MD  HYDROcodone-acetaminophen (NORCO/VICODIN) 5-325 MG tablet Take 1 tablet by mouth every 6 (six) hours as needed for  severe pain. Must last 30 days. 04/22/21 05/22/21  Delano Metz, MD  ipratropium-albuterol (DUONEB) 0.5-2.5 (3) MG/3ML SOLN Take 3 mLs by nebulization every 6 (six) hours as needed (wheezing). 01/06/21   Enedina Finner, MD  omeprazole (PRILOSEC) 20 MG capsule Take 20 mg by mouth daily.  Patient not taking: Reported on 04/17/2021    [provider]  ramelteon (ROZEREM) 8 MG tablet Take 8 mg by mouth at bedtime. Patient not taking: Reported on 04/17/2021    [provider]      Vida Rigger, M.D.  Pulmonary & Critical Care Medicine  [facility]

## 2022-05-20 NOTE — Consult Note (Signed)
Consultation Note Date: 05/20/2022   Patient Name: Elizabeth [name]  DOB: 12/20/1960  MRN: [id number]  Age / Sex: 61 y.o., female  PCP: Lorn Junes, FNP Referring Physician: Alberteen Sam, *  Reason for Consultation: Establishing goals of care  HPI/Patient Profile: 61 y.o. female  with past medical history of COPD 60-pack-year history of smoking quit approximately 2019, CHF, reflux, RSD with spinal cord stimulator anxiety/depression, HTN, admitted on 05/14/2022 with acute COPD exacerbation with chronic combined systolic/diastolic heart failure.   Clinical Assessment and Goals of Care: I have reviewed medical records including EPIC notes, labs and imaging, received report from RN, assessed the patient.  Elizabeth [name], Elizabeth [name], is lying quietly in bed.  She greets me, making and somewhat keeping eye contact.  She appears chronically ill and somewhat frail.  She is alert and oriented, able to make her basic needs known.  Somewhat limited interview today as she is BiPAP dependent.  We meet at bedside to discuss diagnosis prognosis, GOC, EOL wishes, disposition and options.  I introduced Palliative Medicine as specialized medical care for people living with serious illness. It focuses on providing relief from the symptoms and stress of a serious illness. The goal is to improve quality of life for both the patient and the family.  We discussed a brief life review of the patient.  Elizabeth [name] tells me that she is unmarried and has 1 child, son Elizabeth [name].  We then focused on their current illness.  We talked about the use of BiPAP, and pulmonology consult.  We talked about the treatment plan, continue BiPAP, IV Solu-Medrol twice a day.  The natural disease trajectory and expectations at EOL were discussed.  Advanced directives, concepts specific to code status, artifical feeding and hydration, and rehospitalization were  considered and discussed.  We talked about the concept of "treat the treatable, but allowing natural passing".  We talked about setting limits on life support such as limiting time on the ventilator, tracheostomy at 2 weeks.  At this point, Elizabeth [name] shares that she would want to have these discussions with her son before making choices.  She was noted to be DNR on Friday, but changed to full code sometime during the day.  Hospice and Palliative Care services outpatient were not discussed today.  Discussed the importance of continued conversation with family and the medical providers regarding overall plan of care and treatment options, ensuring decisions are within the context of the patient's values and GOCs.  Questions and concerns were addressed.  The patient was encouraged to call with questions or concerns.  PMT will continue to support holistically.  Conference with attending, bedside nursing staff, transition of care team related to patient condition, needs, goals of care, disposition.   HCPOA NEXT OF KIN -Elizabeth [name] names her only child, son Elizabeth [name] as her healthcare surrogate.    SUMMARY OF RECOMMENDATIONS   At this point continue full scope/full code Time for outcomes PMT to continue to follow  Code Status/Advance Care Planning: Full code -discussed with  patient, see above  Symptom Management:  Per hospitalist/pulmonology, no additional needs at this time. As needed morphine and Ativan available.  Palliative Prophylaxis:  Frequent Pain Assessment and Oral Care  Additional Recommendations (Limitations, Scope, Preferences): Full Scope Treatment  Psycho-social/Spiritual:  Desire for further Chaplaincy support:no Additional Recommendations: Caregiving  Support/Resources  Prognosis:  Unable to determine, based on outcomes.  Guarded at this point due to need for continuous BiPAP.  Discharge Planning:  To be determined, based on outcomes.       Primary  Diagnoses: Present on Admission:  HTN (hypertension)  COPD with acute exacerbation (HCC)  Elevated serum creatinine  Chronic anemia  Acute respiratory failure with hypoxia (HCC)  Chronic pain syndrome   I have reviewed the medical record, interviewed the patient and family, and examined the patient. The following aspects are pertinent.  Past Medical History:  Diagnosis Date   Anxiety    Arm pain 07/25/2015   CHF (congestive heart failure) (HCC)    Congestive heart failure (HCC) 07/25/2015   COPD (chronic obstructive pulmonary disease) (HCC)    never been idagnosed   Depression    Hypertension    Lower extremity pain 07/25/2015   Reflux    RSD (reflex sympathetic dystrophy)    Spinal cord stimulator status    Vitamin B12 deficiency    Vitamin D deficiency    Social History   Socioeconomic History   Marital status: Divorced    Spouse name: Not on file   Number of children: Not on file   Years of education: Not on file   Highest education level: Not on file  Occupational History   Not on file  Tobacco Use   Smoking status: Former    Packs/day: 1.50    Years: 40.00    Total pack years: 60.00    Types: Cigarettes    Quit date: 04/11/2018    Years since quitting: 4.1   Smokeless tobacco: Never  Vaping Use   Vaping Use: Never used  Substance and Sexual Activity   Alcohol use: No    Alcohol/week: 0.0 standard drinks of alcohol   Drug use: No   Sexual activity: Yes  Other Topics Concern   Not on file  Social History Narrative   Not on file   Social Determinants of Health   Financial Resource Strain: Unknown (08/14/2018)   Overall Financial Resource Strain (CARDIA)    Difficulty of Paying Living Expenses: Patient refused  Food Insecurity: Unknown (08/14/2018)   Hunger Vital Sign    Worried About Running Out of Food in the Last Year: Patient refused    Ran Out of Food in the Last Year: Patient refused  Transportation Needs: Unknown (08/14/2018)   PRAPARE -  Transportation    Lack of Transportation (Medical): Patient refused    Lack of Transportation (Non-Medical): Patient refused  Physical Activity: Unknown (08/14/2018)   Exercise Vital Sign    Days of Exercise per Week: Patient refused    Minutes of Exercise per Session: Patient refused  Stress: Unknown (08/14/2018)   [employer] of Occupational Health - Occupational Stress Questionnaire    Feeling of Stress : Patient refused  Social Connections: Unknown (08/14/2018)   Social Connection and Isolation Panel [NHANES]    Frequency of Communication with Friends and Family: Patient refused    Frequency of Social Gatherings with Friends and Family: Patient refused    Attends Religious Services: Patient refused    Active Member of Clubs or Organizations: Patient refused  Attends Banker Meetings: Patient refused    Marital Status: Patient refused   Family History  Problem Relation Age of Onset   Cirrhosis Mother    Cancer Father    Scheduled Meds:  atorvastatin  40 mg Oral QHS   azithromycin  500 mg Oral Daily   carvedilol  3.125 mg Oral BID WC   docusate sodium  100 mg Oral BID   enoxaparin (LOVENOX) injection  40 mg Subcutaneous Q24H   feeding supplement  237 mL Oral BID BM   insulin aspart  0-15 Units Subcutaneous TID WC   ipratropium-albuterol  3 mL Nebulization Q4H   methylPREDNISolone (SOLU-MEDROL) injection  40 mg Intravenous Q24H   mometasone-formoterol  2 puff Inhalation BID   nicotine  14 mg Transdermal Daily   mouth rinse  15 mL Mouth Rinse 4 times per day   pantoprazole (PROTONIX) IV  40 mg Intravenous Q12H   potassium chloride SA  20 mEq Oral Daily   sodium chloride flush  3 mL Intravenous Q12H   spironolactone  25 mg Oral Daily   torsemide  20 mg Oral Daily   Continuous Infusions: PRN Meds:.acetaminophen **OR** acetaminophen, albuterol, bisacodyl, guaiFENesin, hydrALAZINE, HYDROcodone-acetaminophen, hydrOXYzine, ipratropium-albuterol, LORazepam,  morphine injection, morphine CONCENTRATE, naloxone, ondansetron **OR** ondansetron (ZOFRAN) IV, mouth rinse, polyethylene glycol, zolpidem Medications Prior to Admission:  Prior to Admission medications   Medication Sig Start Date End Date Taking? Authorizing Provider  atorvastatin (LIPITOR) 40 MG tablet Take 1 tablet (40 mg total) by mouth at bedtime. 08/18/18  Yes Enid Baas, MD  carvedilol (COREG) 3.125 MG tablet Take 1 tablet (3.125 mg total) by mouth 2 (two) times daily with a meal. 08/18/18  Yes Enid Baas, MD  furosemide (LASIX) 20 MG tablet Take 1 tablet (20 mg total) by mouth daily. 08/18/18  Yes Enid Baas, MD  HYDROcodone-acetaminophen (NORCO/VICODIN) 5-325 MG tablet Take 1 tablet by mouth every 6 (six) hours as needed for severe pain. Must last 30 days. 04/17/22 05/17/22 Yes Delano Metz, MD  pantoprazole (PROTONIX) 40 MG tablet Take 40 mg by mouth daily. 03/30/22  Yes [provider]  potassium chloride SA (K-DUR,KLOR-CON) 20 MEQ tablet Take 20 mEq by mouth daily.   Yes [provider]  spironolactone (ALDACTONE) 25 MG tablet Take 25 mg by mouth daily. 06/29/19 05/14/22 Yes [provider]  albuterol (PROVENTIL HFA;VENTOLIN HFA) 108 (90 Base) MCG/ACT inhaler Inhale 2 puffs into the lungs every 6 (six) hours as needed for wheezing or shortness of breath. 11/26/17   Enid Baas, MD  HYDROcodone-acetaminophen (NORCO/VICODIN) 5-325 MG tablet Take 1 tablet by mouth every 6 (six) hours as needed for severe pain. Must last 30 days. 03/18/22 04/17/22  Delano Metz, MD  HYDROcodone-acetaminophen (NORCO/VICODIN) 5-325 MG tablet Take 1 tablet by mouth every 6 (six) hours as needed for severe pain. Must last 30 days. 05/17/22 06/16/22  Delano Metz, MD  hydrOXYzine (ATARAX/VISTARIL) 50 MG tablet Take 1 tablet (50 mg total) by mouth 2 (two) times daily as needed for anxiety. 04/22/21   Darlin Priestly, MD  ipratropium-albuterol (DUONEB) 0.5-2.5 (3)  MG/3ML SOLN Take 3 mLs by nebulization every 6 (six) hours as needed (wheezing). 01/06/21   Enedina Finner, MD  mometasone-formoterol [facility]) 100-5 MCG/ACT AERO Inhale 2 puffs into the lungs 2 (two) times daily. 08/18/18 09/17/21  Enid Baas, MD  naloxone [facility]) nasal spray 4 mg/0.1 mL Place 1 spray into the nose as needed for up to 365 doses (for opioid-induced respiratory depresssion). In case of  emergency (overdose), spray once into each nostril. If no response within 3 minutes, repeat application and call 911. 03/11/22 03/11/23  Delano Metz, MD   Allergies  Allergen Reactions   Lisinopril Anaphylaxis   Review of Systems  Unable to perform ROS: Severe respiratory distress    Physical Exam Vitals and nursing note reviewed.  Constitutional:      General: She is not in acute distress.    Appearance: She is ill-appearing.  Cardiovascular:     Rate and Rhythm: Normal rate.  Pulmonary:     Comments: BiPAP in place Skin:    General: Skin is warm and dry.  Neurological:     Mental Status: She is alert and oriented to person, place, and time.     Vital Signs: BP 122/73 (BP Location: Left Arm)   Pulse 76   Temp 97.9 F (36.6 C)   Resp 19   Wt 59.5 kg   SpO2 99%   BMI 28.39 kg/m  Pain Scale: 0-10 POSS *See Group Information*: S-Acceptable,Sleep, easy to arouse Pain Score: 3    SpO2: SpO2: 99 % O2 Device:SpO2: 99 % O2 Flow Rate: .   IO: Intake/output summary:  Intake/Output Summary (Last 24 hours) at 05/20/2022 1548 Last data filed at 05/20/2022 1523 Gross per 24 hour  Intake 0 ml  Output 1900 ml  Net -1900 ml    LBM: Last BM Date : 05/17/22 Baseline Weight: Weight: 66.5 kg Most recent weight: Weight: 59.5 kg     Palliative Assessment/Data:   Flowsheet Rows    Flowsheet Row Most Recent Value  Intake Tab   Referral Department Hospitalist  Unit at Time of Referral Intermediate Care Unit  Palliative Care Primary Diagnosis Pulmonary  Date Notified 05/20/22   Palliative Care Type Return patient Palliative Care  Reason for referral Clarify Goals of Care  Date of Admission 05/14/22  Date first seen by Palliative Care 05/20/22  # of days Palliative referral response time 0 Day(s)  # of days IP prior to Palliative referral 6  Clinical Assessment   Palliative Performance Scale Score 30%  Pain Max last 24 hours Not able to report  Pain Min Last 24 hours Not able to report  Dyspnea Max Last 24 Hours Not able to report  Dyspnea Min Last 24 hours Not able to report  Psychosocial & Spiritual Assessment   Palliative Care Outcomes        Time In: 1415 Time Out: 1510 Time Total: 55 minutes  Greater than 50%  of this time was spent counseling and coordinating care related to the above assessment and plan.  Signed by: Katheran Awe, NP   Please contact Palliative Medicine Team phone at [phone] for questions and concerns.  For individual provider: See Loretha Stapler

## 2022-05-20 NOTE — Assessment & Plan Note (Signed)
-   Continue ativan, atarax

## 2022-05-20 NOTE — Progress Notes (Signed)
  Progress Note   Patient: Elizabeth [name] IEP:[id number] DOB: Feb 18, 1961 DOA: 05/14/2022     6 DOS: the patient was seen and examined on 05/20/2022 at 11:54AM      Brief hospital course: Dianely Krehbiel is a 61 y.o. female with medical history significant for COPD on 2-3L at night, CHF, chronic pain syndrome on chronic opiates, HTN who presented with COPD flare     Assessment and Plan: * Acute respiratory failure with hypoxia (HCC) Continues on BiPAP  COPD with acute exacerbation (HCC) - Consult Pulmonology - Continue steroids - Continue azithromyicn - Continue bronchodilators - Continue guiafenesin, ativan , morphine   Anxiety - Continue ativan, atarax  Chronic, continuous use of opioids --cont home Norco  Chronic anemia Stable at baseline  Elevated serum creatinine Acute kidney injury ruled out.   HTN (hypertension) --cont coreg and aldactone    Chronic diastolic CHF (congestive heart failure) (HCC) - Continue torsmeide, spironolactone  Chronic pain syndrome - Continue morhpine, Vicodin          Subjective: Required BiPAP overnight, still wheezing heavily, still very anxious.     Physical Exam: Vitals:   05/20/22 1133 05/20/22 1139 05/20/22 1523 05/20/22 1650  BP: 111/70  122/73   Pulse: 82 71 76 77  Resp: 20 16 19  (!) 22  Temp: 97.8 F (36.6 C)  97.9 F (36.6 C)   TempSrc: Axillary  Oral   SpO2: 97% 98% 99% 99%  Weight:       Adult female, sitting up in bed, on BiPAP, appears uncomfortable Tachycardic, regular, no murmurs, no peripheral edema Respiratory rate increased, wheezing bilaterally, Abdomen soft no tenderness palpation or guarding, no ascites or distention Attention normal, affect normal, judgment insight appear normal   Data Reviewed: Discussed with Dr. and Palliative Care Basic metabolic panel shows normal sodium, potassium, creatinine CBC with mild anemia      Disposition: Status is:  Inpatient         Author: Karna Christmas, MD 05/20/2022 5:32 PM  For on call review [email or web address].

## 2022-05-20 NOTE — Hospital Course (Signed)
Elizabeth [name] is a 61 y.o. female with medical history significant for COPD on 2-3L at night, CHF, chronic pain syndrome on chronic opiates, HTN who presented with COPD flare

## 2022-05-20 NOTE — Evaluation (Signed)
Occupational Therapy Evaluation Patient Details Name: Elizabeth [name] MRN: [id number] DOB: [birth year] Today's Date: 05/20/2022   History of Present Illness Kehinde Totzke Angell is a 61 y.o. female seen in ed with complaints of sob since 3 days. The pt is prescribed O2 at night PRN, but has been using continuously d/t SOB. Pt reports worsening LE edema. She has a hx of CHF, COPD, RSD, HTN, lisinopril allergy, and vitamin B-12 and D deficiency.   Clinical Impression   Ms Bucklin was seen for OT evaluation this date. Prior to hospital admission, pt was Independent for mobility and ADLs. Pt lives with children in home c 5 STE. Pt presents to acute OT demonstrating impaired ADL performance and functional mobility 2/2 decreased activity tolerance and functional strength/balance deficits. Pt currently requires MIN A + HHA for simulated BSC t/f, 1 LOB with turning in room. MOD I don B socks long sitting in bed - increased time, requires breaks during coughing. Pt would benefit from skilled OT to address noted impairments and functional limitations (see below for any additional details). Upon hospital discharge, recommend STR to maximize pt safety and return to PLOF.   Recommendations for follow up therapy are one component of a multi-disciplinary discharge planning process, led by the attending physician.  Recommendations may be updated based on patient status, additional functional criteria and insurance authorization.   Follow Up Recommendations  Skilled nursing-short term rehab (<3 hours/day)    Assistance Recommended at Discharge Frequent or constant Supervision/Assistance  Patient can return home with the following A little help with walking and/or transfers;A little help with bathing/dressing/bathroom    Functional Status Assessment  Patient has had a recent decline in their functional status and demonstrates the ability to make significant improvements in function in a reasonable and  predictable amount of time.  Equipment Recommendations  BSC/3in1    Recommendations for Other Services       Precautions / Restrictions Precautions Precautions: Fall Restrictions Weight Bearing Restrictions: No      Mobility Bed Mobility Overal bed mobility: Modified Independent             General bed mobility comments: increased time    Transfers Overall transfer level: Needs assistance Equipment used: None Transfers: Sit to/from Stand Sit to Stand: Min guard           General transfer comment: MIN A to correct LOB      Balance Overall balance assessment: Needs assistance Sitting-balance support: Feet supported, No upper extremity supported Sitting balance-Leahy Scale: Fair     Standing balance support: Single extremity supported, During functional activity Standing balance-Leahy Scale: Fair                             ADL either performed or assessed with clinical judgement   ADL Overall ADL's : Needs assistance/impaired                                       General ADL Comments: MIN A + HHA for simulated BSC t/f. MOD I don B socks long sitting in bed - increased time 2/2 coughing breaks and increased WOB      Pertinent Vitals/Pain Pain Assessment Pain Assessment: Faces Faces Pain Scale: Hurts little more Pain Location: B legs Pain Descriptors / Indicators: Aching, Sore Pain Intervention(s): Limited activity within patient's tolerance, Repositioned  Hand Dominance Right   Extremity/Trunk Assessment Upper Extremity Assessment Upper Extremity Assessment: Overall WFL for tasks assessed   Lower Extremity Assessment Lower Extremity Assessment: Generalized weakness       Communication Communication Communication: No difficulties   Cognition Arousal/Alertness: Awake/alert Behavior During Therapy: Anxious Overall Cognitive Status: Within Functional Limits for tasks assessed                                                   Home Living Family/patient expects to be discharged to:: Private residence Living Arrangements: Children Available Help at Discharge: Family;Available PRN/intermittently Type of Home: House Home Access: Stairs to enter [employer] of Steps: 5 Entrance Stairs-Rails: Can reach both;Right;Left Home Layout: One level     Bathroom Shower/Tub: Producer, television/film/video: Standard Bathroom Accessibility: No              Prior Functioning/Environment Prior Level of Function : Independent/Modified Independent                        OT Problem List: Decreased strength;Decreased range of motion;Decreased activity tolerance;Decreased safety awareness;Cardiopulmonary status limiting activity      OT Treatment/Interventions: Self-care/ADL training;Therapeutic exercise;Energy conservation;DME and/or AE instruction;Therapeutic activities;Patient/family education;Balance training    OT Goals(Current goals can be found in the care plan section) Acute Rehab OT Goals Patient Stated Goal: to breathe better OT Goal Formulation: With patient Time For Goal Achievement: 06/03/22 Potential to Achieve Goals: Good ADL Goals Pt Will Perform Grooming: Independently;standing Pt Will Perform Lower Body Dressing: Independently;sit to/from stand Pt Will Transfer to Toilet: Independently;ambulating;regular height toilet  OT Frequency: Min 2X/week    Co-evaluation              AM-PAC OT "6 Clicks" Daily Activity     Outcome Measure Help from another person eating meals?: None Help from another person taking care of personal grooming?: None Help from another person toileting, which includes using toliet, bedpan, or urinal?: A Little Help from another person bathing (including washing, rinsing, drying)?: A Little Help from another person to put on and taking off regular upper body clothing?: None Help from another person to put on and  taking off regular lower body clothing?: None 6 Click Score: 22   End of Session    Activity Tolerance: Patient tolerated treatment well Patient left: in chair;with call bell/phone within reach;with chair alarm set  OT Visit Diagnosis: Muscle weakness (generalized) (M62.81)                Time: 1610-9604 OT Time Calculation (min): 22 min Charges:  OT General Charges $OT Visit: 1 Visit OT Evaluation $OT Eval Moderate Complexity: 1 Mod OT Treatments $Self Care/Home Management : 8-22 mins  Kathie Dike, M.S. OTR/L  05/20/22, 1:14 PM  ascom [phone]

## 2022-05-21 DIAGNOSIS — Z515 Encounter for palliative care: Secondary | ICD-10-CM | POA: Diagnosis not present

## 2022-05-21 DIAGNOSIS — J9601 Acute respiratory failure with hypoxia: Secondary | ICD-10-CM | POA: Diagnosis not present

## 2022-05-21 DIAGNOSIS — Z7189 Other specified counseling: Secondary | ICD-10-CM | POA: Diagnosis not present

## 2022-05-21 LAB — COMPREHENSIVE METABOLIC PANEL
ALT: 20 U/L (ref 0–44)
AST: 15 U/L (ref 15–41)
Albumin: 3.9 g/dL (ref 3.5–5.0)
Alkaline Phosphatase: 83 U/L (ref 38–126)
Anion gap: 9 (ref 5–15)
BUN: 48 mg/dL — ABNORMAL HIGH (ref 8–23)
CO2: 30 mmol/L (ref 22–32)
Calcium: 8.9 mg/dL (ref 8.9–10.3)
Chloride: 100 mmol/L (ref 98–111)
Creatinine, Ser: 0.86 mg/dL (ref 0.44–1.00)
GFR, Estimated: >60 mL/min (ref >60)
Glucose, Bld: 112 mg/dL — ABNORMAL HIGH (ref 70–99)
Potassium: 4 mmol/L (ref 3.5–5.1)
Sodium: 139 mmol/L (ref 135–145)
Total Bilirubin: 0.6 mg/dL (ref 0.3–1.2)
Total Protein: 7.1 g/dL (ref 6.5–8.1)

## 2022-05-21 LAB — CBC
HCT: 32.5 % — ABNORMAL LOW (ref 36.0–46.0)
Hemoglobin: 9.8 g/dL — ABNORMAL LOW (ref 12.0–15.0)
MCH: 24.3 pg — ABNORMAL LOW (ref 26.0–34.0)
MCHC: 30.2 g/dL (ref 30.0–36.0)
MCV: 80.4 fL (ref 80.0–100.0)
Platelets: 340 10*3/uL (ref 150–400)
RBC: 4.04 MIL/uL (ref 3.87–5.11)
RDW: 14.6 % (ref 11.5–15.5)
WBC: 8.5 10*3/uL (ref 4.0–10.5)
nRBC: 0 % (ref 0.0–0.2)

## 2022-05-21 LAB — GLUCOSE, CAPILLARY
Glucose-Capillary: 107 mg/dL — ABNORMAL HIGH (ref 70–99)
Glucose-Capillary: 133 mg/dL — ABNORMAL HIGH (ref 70–99)
Glucose-Capillary: 139 mg/dL — ABNORMAL HIGH (ref 70–99)
Glucose-Capillary: 154 mg/dL — ABNORMAL HIGH (ref 70–99)
Glucose-Capillary: 99 mg/dL (ref 70–99)

## 2022-05-21 MED ORDER — PROSOURCE PLUS PO LIQD
30.0000 mL | Freq: Three times a day (TID) | ORAL | Status: DC
  Administered 2022-05-21 – 2022-05-25 (×10): 30 mL via ORAL

## 2022-05-21 MED ORDER — ADULT MULTIVITAMIN W/MINERALS CH
1.0000 | ORAL_TABLET | Freq: Every day | ORAL | Status: DC
  Administered 2022-05-21 – 2022-05-25 (×5): 1 via ORAL
  Filled 2022-05-21 (×5): qty 1

## 2022-05-21 NOTE — Progress Notes (Signed)
Physical Therapy Treatment Patient Details Name: Elizabeth [name] MRN: [id number] DOB: 03/02/1961 Today's Date: 05/21/2022   History of Present Illness Elizabeth [name] is a 61 y.o. female seen in ed with complaints of sob since 3 days. The pt is prescribed O2 at night PRN, but has been using continuously d/t SOB. Pt reports worsening LE edema. She has a hx of CHF, COPD, RSD, HTN, lisinopril allergy, and vitamin B-12 and D deficiency.    PT Comments    Pt was long sitting in bed on bipap upon arriving. She did trial being off bipap earlier this morning with RN staff, but is currently back on bipap and unwilling to trial PT without. She did agree to performing OOB activity. BP was 117/74 at rest. Sao2 100 % with RR in low 30s. She was able to progress to EOB short sit with supervision. Sat EOB with supervision x several minutes prior to standing trials. She tolerated standing EOB several times ~ 2 minutes with some marching in place. RR quickly elevated to upper 40s. Constant education on relaxation techniques and ways of helping to control SOB. Prolonged seated rest between trials. Overall pt is progress but continues to be limited by respiratory response to minimal activity. Recommend SNF but if respiratory status improves, may be able to progress to home with HHPT. Currently, SNF is most appropriate.     Recommendations for follow up therapy are one component of a multi-disciplinary discharge planning process, led by the attending physician.  Recommendations may be updated based on patient status, additional functional criteria and insurance authorization.  Follow Up Recommendations  Skilled nursing-short term rehab (<3 hours/day) (due to RR response to minimal activity. may progress to home with HHPT if respiratory status improves.)     Assistance Recommended at Discharge Set up Supervision/Assistance  Patient can return home with the following A little help with walking and/or  transfers;A little help with bathing/dressing/bathroom;Assistance with cooking/housework;Assist for transportation;Help with stairs or ramp for entrance   Equipment Recommendations  Rolling walker (2 wheels)       Precautions / Restrictions Precautions Precautions: Fall Restrictions Weight Bearing Restrictions: No     Mobility  Bed Mobility Overal bed mobility: Modified Independent Bed Mobility: Supine to Sit, Sit to Supine     Supine to sit: Supervision, HOB elevated Sit to supine: Supervision, HOB elevated    Transfers Overall transfer level: Needs assistance Equipment used: Rolling walker (2 wheels) Transfers: Sit to/from Stand Sit to Stand: Supervision      General transfer comment: Rw used to help pt become less anxious in standing. did not want to sit in recliner. stood EOB 3 x ~ 2 minutes each with vcs throughout for relaxation techniques. RR elevates quickly into upper 40s but sao2 > 95% throughout.    Ambulation/Gait     General Gait Details: pt was on bipap however tolerated standing EOB and performing marching in place.sao2 >96% throughout     Balance Overall balance assessment: Needs assistance Sitting-balance support: Feet supported, No upper extremity supported Sitting balance-Leahy Scale: Fair     Standing balance support: Single extremity supported, During functional activity Standing balance-Leahy Scale: Fair         Cognition Arousal/Alertness: Awake/alert Behavior During Therapy: Anxious (" I feel less anxious today.") Overall Cognitive Status: Within Functional Limits for tasks assessed      General Comments: pt is A and O but pt get anxious with very little activity.  Pertinent Vitals/Pain Pain Assessment Pain Assessment: No/denies pain Pain Score: 0-No pain Pain Intervention(s): Limited activity within patient's tolerance, Monitored during session, Premedicated before session, Repositioned     PT Goals (current  goals can now be found in the care plan section) Acute Rehab PT Goals Patient Stated Goal: "Breath better so I can go home." Progress towards PT goals: Progressing toward goals    Frequency    Min 2X/week      PT Plan Current plan remains appropriate       AM-PAC PT "6 Clicks" Mobility   Outcome Measure  Help needed turning from your back to your side while in a flat bed without using bedrails?: None Help needed moving from lying on your back to sitting on the side of a flat bed without using bedrails?: A Little Help needed moving to and from a bed to a chair (including a wheelchair)?: A Little Help needed standing up from a chair using your arms (e.g., wheelchair or bedside chair)?: A Little Help needed to walk in hospital room?: A Little Help needed climbing 3-5 steps with a railing? : A Lot 6 Click Score: 18    End of Session Equipment Utilized During Treatment: Other (comment) (bi-pap) Activity Tolerance: Patient tolerated treatment well (limited by SOB/anxiety/elevated RR with minimal activity) Patient left: in bed;with call bell/phone within reach Nurse Communication: Mobility status PT Visit Diagnosis: Unsteadiness on feet (R26.81);Muscle weakness (generalized) (M62.81);Difficulty in walking, not elsewhere classified (R26.2)     Time: 3254-9826 PT Time Calculation (min) (ACUTE ONLY): 16 min  Charges:  $Therapeutic Activity: 8-22 mins                     Jetta Lout PTA 05/21/22, 12:50 PM

## 2022-05-21 NOTE — Progress Notes (Signed)
Nutrition Follow-up  DOCUMENTATION CODES:   Obesity unspecified  INTERVENTION:   -D/c Ensure Enlive po BID, each supplement provides 350 kcal and 20 grams of protein -Magic cup TID with meals, each supplement provides 290 kcal and 9 grams of protein  -30 ml Prosource Plus TID, each supplement provides 100 kcals and 15 grams protein -If intake remains inadequate and is consistent with goals of care, consider:  Initiate Osmolite 1.2 @ 20 ml/hr and increase by 10 ml every 8 hours to goal rate of 50 ml/hr.   If no IVFs, 95 ml free water flush every 6 hours  Tube feeding regimen provides 1440 kcal (100% of needs), 67 grams of protein, and 984 ml of H2O. Total free water: 1364 ml daily   NUTRITION DIAGNOSIS:   Increased nutrient needs related to chronic illness (COPD) as evidenced by estimated needs.  Ongoing  GOAL:   Patient will meet greater than or equal to 90% of their needs  Unmet  MONITOR:   PO intake, Supplement acceptance  REASON FOR ASSESSMENT:   Consult Assessment of nutrition requirement/status  ASSESSMENT:   Pt with history of heart failure and COPD admitted with complaints of shortness of breath for 3 days.  Reviewed I/O's: -1.4 L x 24 hours and -9.4 L since admission  UOP: 0 ml x 24 hours  Pt resting on Bi-pap at time of visit. NO supports at bedside.   Pt with minimal intake. Noted meal completions 0% over the past 48 hours. Pt is refusing Ensure supplements.   Pt with worsening respiratory status, needing frequent Bi-pap. Palliative care following for goals of care discussions; plan for full aggressive care at this time.   Wt has been stable since admission.  \  Medications reviewed and include solu-medrol and demadex.    Labs reviewed: CBGS: 99-156 (inpatient orders for glycemic control are 0-15 units insulin aspart TID with meals).    Diet Order:   Diet Order             Diet 2 gram sodium Room service appropriate? Yes; Fluid consistency:  Thin  Diet effective now                   EDUCATION NEEDS:   Not appropriate for education at this time  Skin:  Skin Assessment: Reviewed RN Assessment  Last BM:  05/17/22  Height:   Ht Readings from Last 1 Encounters:  03/11/22 4\' 9"  (1.448 m)    Weight:   Wt Readings from Last 1 Encounters:  05/21/22 65.8 kg    Ideal Body Weight:  43.2 kg  BMI:  Body mass index is 31.39 kg/m.  Estimated Nutritional Needs:   Kcal:  1350-1550  Protein:  60-75 grams  Fluid:  > 1.3 L    07/21/22, RD, LDN, CDCES Registered Dietitian II Certified Diabetes Care and Education Specialist Please refer to [facility] for RD and/or RD on-call/weekend/after hours pager

## 2022-05-21 NOTE — Progress Notes (Signed)
Pt was off bipap to try and rest with it off but she became very SOB and she requested to be placed back on, pt placed back on at previous settings.

## 2022-05-21 NOTE — Progress Notes (Signed)
Palliative: [name], Elizabeth [name], is resting quietly in bed.  She greets me, making and somewhat keeping eye contact.  She is alert and oriented, able to make her basic needs known.  There is no family at bedside at this time.  Somewhat limited discussions again today due to her use of BiPAP.  We talk about her breathing and symptom management.  Overall, she agrees that symptom management is effective to help control her anxiety and work of breathing.  We talked about hydration and nutrition.  I ask if Elizabeth [name] is able to take in enough to eat and drink, taking breaks from BiPAP.  She tells me that she feels that she is able to eat and drink enough at this time.  When asked how long she is able to stay off of BiPAP, she states about 5 minutes.  I encouraged her to take medicines for symptom management and try to wean from BiPAP.  Again today we talk about CODE STATUS.  She states that she has not been able to speak with her son Elizabeth [name] about her choices.  She denies questions about CODE STATUS/life support. Overall, goals are set.  PMT to shadow for declines.  Would benefit from outpatient palliative services.  Conference with attending, bedside nursing staff, transition of care team related to patient condition, needs, goals of care, disposition.  Plan:   Overall, respiratory status seems to be slowly improving.  Goals are set for full scope/full code.  Would benefit from outpatient palliative services to continue goals of care discussion once outside of the hospital and more normal circumstance.  35 minutes  Elizabeth Carmel, NP Palliative medicine team Team phone [phone] Greater than 50% of this time was spent counseling and coordinating care related to the above assessment and plan.

## 2022-05-21 NOTE — Progress Notes (Signed)
PROGRESS NOTE    Elizabeth [name]  NUU:[id number] DOB: [birth year] DOA: 05/14/2022 PCP: Lorn Junes, FNP    Brief Narrative:  Elizabeth [name] is a 61 y.o. female with medical history significant for COPD on 2-3L at night, CHF, chronic pain syndrome on chronic opiates, HTN who presented with COPD flare  Case discussed with pulmonology.  Consultation appreciated.  Severe COPD exacerbation.  Patient has a history of ICU admission for severe COPD flare approximately 1 year ago requiring endotracheal intubation.  Have been unable to wean from BiPAP.  On 8/9 patient had significant inspiratory and expiratory wheeze.  Was on BiPAP and around-the-clock bronchodilators.  On 8/10 respiratory status slowly improving but still unable to entirely wean from BiPAP.   Assessment & Plan:   Principal Problem:   Acute respiratory failure with hypoxia (HCC) Active Problems:   COPD with acute exacerbation (HCC)   Chronic pain syndrome   Chronic diastolic CHF (congestive heart failure) (HCC)   HTN (hypertension)   Elevated serum creatinine   Chronic anemia   Anxiety  * Acute respiratory failure with hypoxia (HCC) Continues on BiPAP Wean as tolerated   COPD with acute exacerbation (HCC) Severe COPD with history of severe flares and endotracheal intubation in ICU 1 year ago Pulmonology engaged for comment, case discussed with Dr. Karna Christmas Plan: IV steroids Around-the-clock bronchodilators Anxiolytics and antitussives Mucolytic's Empiric antibiotics NIPPV, wean as tolerated   Anxiety - Continue ativan, atarax   Chronic, continuous use of opioids --cont home Norco   Chronic anemia Stable at baseline   Elevated serum creatinine Acute kidney injury ruled out.     HTN (hypertension) --cont coreg and aldactone   Chronic diastolic CHF (congestive heart failure) (HCC) - Continue torsmeide, spironolactone   Chronic pain syndrome - Continue morhpine, Vicodin    DVT  prophylaxis: SQ Lovenox Code Status: Full Family Communication: None today Disposition Plan: Status is: Inpatient Remains inpatient appropriate because: Severe acute COPD, severe respiratory failure on NIPPV.  Unknown date of discharge.   Level of care: Progressive  Consultants:  Pulmonology Palliative care  Procedures:  BiPAP  Antimicrobials: Zithromax   Subjective: Seen and examined.  Wheezing improved.  Still remains somewhat anxious with rapid breathing  Objective: Vitals:   05/21/22 0418 05/21/22 0750 05/21/22 0804 05/21/22 1135  BP: 105/69 113/73  113/73  Pulse: 64 66  66  Resp: 18 17  18   Temp: (!) 97.5 F (36.4 C) 97.7 F (36.5 C)  97.7 F (36.5 C)  TempSrc: Axillary     SpO2: 96% 99% 99% 98%  Weight:        Intake/Output Summary (Last 24 hours) at 05/21/2022 1201 Last data filed at 05/21/2022 1135 Gross per 24 hour  Intake 0 ml  Output 1350 ml  Net -1350 ml   Filed Weights   05/19/22 0602 05/20/22 0354 05/21/22 0417  Weight: 61.9 kg 59.5 kg 65.8 kg    Examination:  General exam: Mildly anxious.  Tachypneic Respiratory system: Diminished breath sounds.  End expiratory wheeze.  Tachypneic.  BiPAP Cardiovascular system: S1-S2, RRR, no murmurs, no pedal edema Gastrointestinal system: Soft, NT/ND, normal bowel sounds Central nervous system: Alert and oriented. No focal neurological deficits. Extremities: Symmetric 5 x 5 power. Skin: No rashes, lesions or ulcers Psychiatry: Judgement and insight appear normal. Mood & affect appropriate.     Data Reviewed: I have personally reviewed following labs and imaging studies  CBC: Recent Labs  Lab 05/17/22 0447 05/18/22 0322 05/19/22 0415 05/20/22  [id number] 05/21/22 0650  WBC 8.2 5.5 6.6 7.5 8.5  HGB 8.1* 8.7* 9.6* 9.5* 9.8*  HCT 27.7* 29.3* 32.6* 31.8* 32.5*  MCV 84.2 82.5 85.1 81.3 80.4  PLT 275 284 336 326 [id number]   Basic Metabolic Panel: Recent Labs  Lab 05/16/22 0551 05/17/22 0447 05/18/22 0322  05/19/22 0415 05/20/22 0437 05/21/22 0650  NA 140 140 140 141 139 139  K 4.4 4.4 4.2 3.8 3.9 4.0  CL 105 107 105 101 101 100  CO2 28 26 28 26 27 30   GLUCOSE 127* 105* 123* 149* 123* 112*  BUN 27* 33* 36* 41* 43* 48*  CREATININE 0.71 0.71 0.93 0.99 0.88 0.86  CALCIUM 9.0 8.8* 8.5* 8.9 8.8* 8.9  MG 3.0* 3.1* 2.7* 2.7* 2.8*  --    GFR: Estimated Creatinine Clearance: 53.7 mL/min (by C-G formula based on SCr of 0.86 mg/dL). Liver Function Tests: Recent Labs  Lab 05/14/22 1842 05/21/22 0650  AST 22 15  ALT 18 20  ALKPHOS 115 83  BILITOT 0.4 0.6  PROT 7.7 7.1  ALBUMIN 4.3 3.9   No results for input(s): "LIPASE", "AMYLASE" in the last 168 hours. No results for input(s): "AMMONIA" in the last 168 hours. Coagulation Profile: No results for input(s): "INR", "PROTIME" in the last 168 hours. Cardiac Enzymes: No results for input(s): "CKTOTAL", "CKMB", "CKMBINDEX", "TROPONINI" in the last 168 hours. BNP (last 3 results) No results for input(s): "PROBNP" in the last 8760 hours. HbA1C: No results for input(s): "HGBA1C" in the last 72 hours. CBG: Recent Labs  Lab 05/20/22 2004 05/21/22 0018 05/21/22 0426 05/21/22 0823 05/21/22 1134  GLUCAP 150* 154* 133* 99 107*   Lipid Profile: No results for input(s): "CHOL", "HDL", "LDLCALC", "TRIG", "CHOLHDL", "LDLDIRECT" in the last 72 hours. Thyroid Function Tests: No results for input(s): "TSH", "T4TOTAL", "FREET4", "T3FREE", "THYROIDAB" in the last 72 hours. Anemia Panel: No results for input(s): "VITAMINB12", "FOLATE", "FERRITIN", "TIBC", "IRON", "RETICCTPCT" in the last 72 hours. Sepsis Labs: No results for input(s): "PROCALCITON", "LATICACIDVEN" in the last 168 hours.  Recent Results (from the past 240 hour(s))  SARS Coronavirus 2 by RT PCR (hospital order, performed in [facility] hospital lab) *cepheid single result test* Anterior Nasal Swab     Status: None   Collection Time: 05/16/22  6:31 PM   Specimen: Anterior Nasal Swab   Result Value Ref Range Status   SARS Coronavirus 2 by RT PCR NEGATIVE NEGATIVE Final    Comment: (NOTE) SARS-CoV-2 target nucleic acids are NOT DETECTED.  The SARS-CoV-2 RNA is generally detectable in upper and lower respiratory specimens during the acute phase of infection. The lowest concentration of SARS-CoV-2 viral copies this assay can detect is 250 copies / mL. A negative result does not preclude SARS-CoV-2 infection and should not be used as the sole basis for treatment or other patient management decisions.  A negative result may occur with improper specimen collection / handling, submission of specimen other than nasopharyngeal swab, presence of viral mutation(s) within the areas targeted by this assay, and inadequate number of viral copies (<250 copies / mL). A negative result must be combined with clinical observations, patient history, and epidemiological information.  Fact Sheet for Patients:   https://www.patel.info/  Fact Sheet for Healthcare Providers: https://hall.com/  This test is not yet approved or  cleared by the [place] FDA and has been authorized for detection and/or diagnosis of SARS-CoV-2 by FDA under an Emergency Use Authorization (EUA).  This EUA will remain in effect (meaning this  test can be used) for the duration of the COVID-19 declaration under Section 564(b)(1) of the Act, 21 U.S.C. section 360bbb-3(b)(1), unless the authorization is terminated or revoked sooner.  Performed at [facility], [address] [place], [place] [address]          Radiology Studies: No results found.      Scheduled Meds:  atorvastatin  40 mg Oral QHS   azithromycin  500 mg Oral Daily   carvedilol  3.125 mg Oral BID WC   docusate sodium  100 mg Oral BID   enoxaparin (LOVENOX) injection  40 mg Subcutaneous Q24H   feeding supplement  237 mL Oral BID BM   insulin aspart  0-15 Units Subcutaneous  TID WC   ipratropium-albuterol  3 mL Nebulization TID   methylPREDNISolone (SOLU-MEDROL) injection  40 mg Intravenous Q24H   mometasone-formoterol  2 puff Inhalation BID   nicotine  14 mg Transdermal Daily   mouth rinse  15 mL Mouth Rinse 4 times per day   pantoprazole (PROTONIX) IV  40 mg Intravenous Q12H   potassium chloride SA  20 mEq Oral Daily   sodium chloride flush  3 mL Intravenous Q12H   spironolactone  25 mg Oral Daily   torsemide  20 mg Oral Daily   Continuous Infusions:   LOS: 7 days      Tresa Moore, MD [facility]   If 7PM-7AM, please contact night-coverage  05/21/2022, 12:01 PM

## 2022-05-21 NOTE — Progress Notes (Signed)
NAME:  Elizabeth [name], MRN:  [id number], DOB:  [birth year], LOS: 7 ADMISSION DATE:  05/14/2022, CONSULTATION DATE:  04/18/21 REFERRING MD: Dr. Billie Ruddy, CHIEF COMPLAINT: Acute respiratory distress  Brief Pt Description / Synopsis:  61 year old female admitted with acute respiratory distress due to severe COPD exacerbation requiring BiPAP.  High risk for intubation.  History of Present Illness:  Elizabeth [name] is a 61 year old female with a past medical history as listed below who presented to [facility] ED on 05/14/22 due to shortness of breath and respiratory distress.  Pt was previously in respiratory distress on BiPAP  and requires constant attention from RN and continuous BIPAP use due to ongoing dyspnea despite full scope of therapy.   Has severe pan inspiratory and expiratory wheezing.  05/19/22 - patient remains on BIPAP with reduced settings.  She feels taht BIPAP allows her to rest and sleep but breathless without it. She is less swollen on aldactone and torsemide with preserved renal function.   05/20/22- reduced steroids to 40mg  IV daily.  She is wheezing less but still on minimal settings with BIPAP.  She does not want to take it off for comfort.  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  Recommend OT today.  05/21/22- remains on bipap but slowly imprpved.   Pertinent  Medical History  COPD Combined systolic and diastolic CHF (LVEF [id number] on echo in 2019) Hypertension Chronic pain syndrome Anxiety Tobacco abuse  Micro Data:  04/18/2021: SARS-CoV-2 and influenza PCR>> negative  Antimicrobials:  N/A    Objective   Blood pressure 113/73, pulse 66, temperature 97.7 F (36.5 C), resp. rate 17, weight 65.8 kg, SpO2 99 %.    FiO2 (%):  [30 %] 30 %   Intake/Output Summary (Last 24 hours) at 05/21/2022 0754 Last data filed at 05/21/2022 0000 Gross per 24 hour  Intake 0 ml  Output 1350 ml  Net -1350 ml    Filed Weights    05/19/22 0602 05/20/22 0354 05/21/22 0417  Weight: 61.9 kg 59.5 kg 65.8 kg    Examination: General: Acutely ill-appearing female, sitting in bed, with moderate respiratory distress despite being on BiPAP, very anxious HENT: Atraumatic, normocephalic, neck supple, no JVD Lungs: Inspiratory and expiratory wheezing throughout, no rhonchi auscultated, tachypnea with accessory muscle use on BiPAP, even Cardiovascular: Regular rate and rhythm, S1-S2, no murmurs, rubs, gallops, 2+ distal pulses Abdomen: Soft, nontender, nondistended, no guarding rebound tenderness, bowel sounds positive x4 Extremities: Normal bulk and tone, no deformities, no edema, good peripheral circulation Neuro: Awake, alert and oriented x4, moves all extremities to commands, no focal deficits, speech clear GU: Deferred Skin: Warm and dry.  No obvious rashes, lesions, ulcerations  Resolved Hospital Problem list     Assessment & Plan:   Severe Acute COPD Exacerbation -Supplemental O2 as needed to maintain O2 sats 88 to 92% -BiPAP, wean as tolerated -moderate risk for intubation -Follow intermittent Chest X-ray & ABG as needed -Bronchodilators q4h duoneb -IV Solu-Medrol 40 bid, at this time prednisone PO is not enough for her severe wheezing bilaterally.  -Prn Morphine for air hunger   Chronic combined Systolic & Diastolic CHF without acute exacerbation Hypertension -Continuous cardiac monitoring -Maintain MAP >65 -HS Troponin negative x2 (3 ~ 4) -BNP 67 -Most recent ECHO in 2019 with LVEF 45% and grade 1 Diastolic Dysfunction -will increase diuresis due to pitting LE edema with pan inspiratory wheezing. Currently on aldactone will add torsemide 20 daily  Generalized Anxiety Disorder Chronic Pain Syndrome -Provide supportive care -PRN Ativan/Xanax     Best Practice (right click and "Reselect all SmartList Selections" daily)   Diet/type: NPO DVT prophylaxis: LMWH GI prophylaxis: PPI Lines: N/A Foley:   N/A Code Status:  full code Last date of multidisciplinary goals of care discussion [N/A]    Labs   CBC: Recent Labs  Lab 05/17/22 0447 05/18/22 0322 05/19/22 0415 05/20/22 0437 05/21/22 0650  WBC 8.2 5.5 6.6 7.5 8.5  HGB 8.1* 8.7* 9.6* 9.5* 9.8*  HCT 27.7* 29.3* 32.6* 31.8* 32.5*  MCV 84.2 82.5 85.1 81.3 80.4  PLT 275 284 336 326 340     Basic Metabolic Panel: Recent Labs  Lab 05/16/22 0551 05/17/22 0447 05/18/22 0322 05/19/22 0415 05/20/22 0437 05/21/22 0650  NA 140 140 140 141 139 139  K 4.4 4.4 4.2 3.8 3.9 4.0  CL 105 107 105 101 101 100  CO2 28 26 28 26 27 30   GLUCOSE 127* 105* 123* 149* 123* 112*  BUN 27* 33* 36* 41* 43* 48*  CREATININE 0.71 0.71 0.93 0.99 0.88 0.86  CALCIUM 9.0 8.8* 8.5* 8.9 8.8* 8.9  MG 3.0* 3.1* 2.7* 2.7* 2.8*  --     GFR: Estimated Creatinine Clearance: 53.7 mL/min (by C-G formula based on SCr of 0.86 mg/dL). Recent Labs  Lab 05/18/22 0322 05/19/22 0415 05/20/22 0437 05/21/22 0650  WBC 5.5 6.6 7.5 8.5     Liver Function Tests: Recent Labs  Lab 05/14/22 1842 05/21/22 0650  AST 22 15  ALT 18 20  ALKPHOS 115 83  BILITOT 0.4 0.6  PROT 7.7 7.1  ALBUMIN 4.3 3.9    No results for input(s): "LIPASE", "AMYLASE" in the last 168 hours. No results for input(s): "AMMONIA" in the last 168 hours.  ABG    Component Value Date/Time   HCO3 26.0 05/14/2022 2013   ACIDBASEDEF 1.6 04/17/2021 1749   O2SAT 93.4 05/14/2022 2013     Coagulation Profile: No results for input(s): "INR", "PROTIME" in the last 168 hours.  Cardiac Enzymes: No results for input(s): "CKTOTAL", "CKMB", "CKMBINDEX", "TROPONINI" in the last 168 hours.  HbA1C: Hgb A1c MFr Bld  Date/Time Value Ref Range Status  05/15/2022 05:01 AM 5.9 (H) 4.8 - 5.6 % Final    Comment:    (NOTE) Pre diabetes:          5.7%-6.4%  Diabetes:              >6.4%  Glycemic control for   <7.0% adults with diabetes   01/04/2021 06:29 AM 5.9 (H) 4.8 - 5.6 % Final     Comment:    (NOTE) Pre diabetes:          5.7%-6.4%  Diabetes:              >6.4%  Glycemic control for   <7.0% adults with diabetes     CBG: Recent Labs  Lab 05/20/22 1133 05/20/22 1612 05/20/22 2004 05/21/22 0018 05/21/22 0426  GLUCAP 111* 133* 150* 154* 133*     Review of Systems:   Positives in BOLD: Gen: Denies fever, chills, weight change, fatigue, night sweats HEENT: Denies blurred vision, double vision, hearing loss, tinnitus, sinus congestion, rhinorrhea, sore throat, neck stiffness, dysphagia PULM: Denies shortness of breath, cough, sputum production, hemoptysis, wheezing CV: Denies chest tightness, edema, orthopnea, paroxysmal nocturnal dyspnea, palpitations GI: Denies abdominal pain, nausea, vomiting, diarrhea, hematochezia, melena, constipation, change in bowel habits GU: Denies dysuria, hematuria, polyuria, oliguria, urethral discharge  Endocrine: Denies hot or cold intolerance, polyuria, polyphagia or appetite change Derm: Denies rash, dry skin, scaling or peeling skin change Heme: Denies easy bruising, bleeding, bleeding gums Neuro: Denies headache, numbness, weakness, slurred speech, loss of memory or consciousness    Past Medical History:  She,  has a past medical history of Anxiety, Arm pain (07/25/2015), CHF (congestive heart failure) (HCC), Congestive heart failure (HCC) (07/25/2015), COPD (chronic obstructive pulmonary disease) (HCC), Depression, Hypertension, Lower extremity pain (07/25/2015), Reflux, RSD (reflex sympathetic dystrophy), Spinal cord stimulator status, Vitamin B12 deficiency, and Vitamin D deficiency.   Surgical History:   Past Surgical History:  Procedure Laterality Date   SPINAL CORD STIMULATOR IMPLANT     x 2     Social History:   reports that she quit smoking about 4 years ago. Her smoking use included cigarettes. She has a 60.00 pack-year smoking history. She has never used smokeless tobacco. She reports that she does not  drink alcohol and does not use drugs.   Family History:  Her family history includes Cancer in her father; Cirrhosis in her mother.   Allergies Allergies  Allergen Reactions   Lisinopril Anaphylaxis     Home Medications  Prior to Admission medications   Medication Sig Start Date End Date Taking? Authorizing Provider  atorvastatin (LIPITOR) 40 MG tablet Take 1 tablet (40 mg total) by mouth at bedtime. 08/18/18  Yes Enid Baas, MD  carvedilol (COREG) 3.125 MG tablet Take 1 tablet (3.125 mg total) by mouth 2 (two) times daily with a meal. 08/18/18  Yes Enid Baas, MD  cetirizine (ZYRTEC) 10 MG tablet Take 10 mg by mouth daily.   Yes [provider]  furosemide (LASIX) 20 MG tablet Take 1 tablet (20 mg total) by mouth daily. Patient taking differently: Take 20 mg by mouth 2 (two) times daily. 08/18/18  Yes Enid Baas, MD  HYDROcodone-acetaminophen (NORCO/VICODIN) 5-325 MG tablet Take 1 tablet by mouth every 6 (six) hours as needed for severe pain. Must last 30 days. 03/23/21 04/22/21 Yes Delano Metz, MD  mometasone-formoterol (DULERA) 100-5 MCG/ACT AERO Inhale 2 puffs into the lungs 2 (two) times daily. 08/18/18 04/17/21 Yes Enid Baas, MD  pantoprazole (PROTONIX) 40 MG tablet Take 40 mg by mouth daily. 03/19/21  Yes [provider]  potassium chloride SA (K-DUR,KLOR-CON) 20 MEQ tablet Take 20 mEq by mouth daily.   Yes [provider]  spironolactone (ALDACTONE) 25 MG tablet Take 25 mg by mouth daily. 06/29/19 04/17/21 Yes [provider]  albuterol (PROVENTIL HFA;VENTOLIN HFA) 108 (90 Base) MCG/ACT inhaler Inhale 2 puffs into the lungs every 6 (six) hours as needed for wheezing or shortness of breath. 11/26/17   Enid Baas, MD  HYDROcodone-acetaminophen (NORCO/VICODIN) 5-325 MG tablet Take 1 tablet by mouth every 6 (six) hours as needed for severe pain. Must last 30 days. 02/21/21 03/23/21  Delano Metz, MD   HYDROcodone-acetaminophen (NORCO/VICODIN) 5-325 MG tablet Take 1 tablet by mouth every 6 (six) hours as needed for severe pain. Must last 30 days. 04/22/21 05/22/21  Delano Metz, MD  ipratropium-albuterol (DUONEB) 0.5-2.5 (3) MG/3ML SOLN Take 3 mLs by nebulization every 6 (six) hours as needed (wheezing). 01/06/21   Enedina Finner, MD  omeprazole (PRILOSEC) 20 MG capsule Take 20 mg by mouth daily.  Patient not taking: Reported on 04/17/2021    [provider]  ramelteon (ROZEREM) 8 MG tablet Take 8 mg by mouth at bedtime. Patient not taking: Reported on 04/17/2021    [provider]  Ottie Glazier, M.D.  [facility] [place]

## 2022-05-21 NOTE — Plan of Care (Signed)
°  Problem: Education: °Goal: Ability to demonstrate management of disease process will improve °Outcome: Progressing °  °Problem: Cardiac: °Goal: Ability to achieve and maintain adequate cardiopulmonary perfusion will improve °Outcome: Progressing °  °Problem: Education: °Goal: Ability to demonstrate management of disease process will improve °Outcome: Progressing °  °

## 2022-05-22 DIAGNOSIS — J9601 Acute respiratory failure with hypoxia: Secondary | ICD-10-CM | POA: Diagnosis not present

## 2022-05-22 LAB — GLUCOSE, CAPILLARY
Glucose-Capillary: 123 mg/dL — ABNORMAL HIGH (ref 70–99)
Glucose-Capillary: 177 mg/dL — ABNORMAL HIGH (ref 70–99)
Glucose-Capillary: 130 mg/dL — ABNORMAL HIGH (ref 70–99)
Glucose-Capillary: 144 mg/dL — ABNORMAL HIGH (ref 70–99)

## 2022-05-22 NOTE — Progress Notes (Signed)
Occupational Therapy Treatment Patient Details Name: Elizabeth [name] MRN: [id number] DOB: 10-11-1961 Today's Date: 05/22/2022   History of present illness Elizabeth [name] is a 61 y.o. female seen in ed with complaints of sob since 3 days. The pt is prescribed O2 at night PRN, but has been using continuously d/t SOB. Pt reports worsening LE edema. She has a hx of CHF, COPD, RSD, HTN, lisinopril allergy, and vitamin B-12 and D deficiency.   OT comments  Ms Coppess was seen for OT treatment on this date. Upon arrival to room pt reclined in bed, reports bed wet, agreeable to OOB for bed change but defers sitting in recliner. Pt requires MIN A + HHA for BSC t/f, slow shaky movements. MIN A don/doff gown in sitting. SETUP + SUPERVISION pericare. SpO2 98% on 3L [place], wheezing noted during mobility. Pt making good progress toward goals, will continue to follow POC. Discharge recommendation remains appropriate.     Recommendations for follow up therapy are one component of a multi-disciplinary discharge planning process, led by the attending physician.  Recommendations may be updated based on patient status, additional functional criteria and insurance authorization.    Follow Up Recommendations  Skilled nursing-short term rehab (<3 hours/day)    Assistance Recommended at Discharge Frequent or constant Supervision/Assistance  Patient can return home with the following  A little help with walking and/or transfers;A little help with bathing/dressing/bathroom   Equipment Recommendations  BSC/3in1    Recommendations for Other Services      Precautions / Restrictions Precautions Precautions: Fall Restrictions Weight Bearing Restrictions: No       Mobility Bed Mobility Overal bed mobility: Needs Assistance Bed Mobility: Supine to Sit, Sit to Supine     Supine to sit: Supervision, HOB elevated Sit to supine: Supervision, HOB elevated   General bed mobility comments: slow movements  with rest breaks    Transfers Overall transfer level: Needs assistance Equipment used: 1 person hand held assist Transfers: Sit to/from Stand Sit to Stand: Min guard           General transfer comment: shaky and slow standing from [facility]     Balance Overall balance assessment: Needs assistance Sitting-balance support: Feet supported, No upper extremity supported Sitting balance-Leahy Scale: Fair     Standing balance support: Single extremity supported, During functional activity Standing balance-Leahy Scale: Fair                             ADL either performed or assessed with clinical judgement   ADL Overall ADL's : Needs assistance/impaired                                       General ADL Comments: MIN A + HHA for BSC t/f. MIN A don/doff gown in sitting. SETUP + SUPERVISION pericare.      Cognition Arousal/Alertness: Awake/alert Behavior During Therapy: Anxious Overall Cognitive Status: Within Functional Limits for tasks assessed                                                General Comments SpO2 98% on 3L [place]    Pertinent Vitals/ Pain       Pain Assessment Pain Assessment: No/denies pain  Frequency  Min 2X/week        Progress Toward Goals  OT Goals(current goals can now be found in the care plan section)  Progress towards OT goals: Progressing toward goals  Acute Rehab OT Goals Patient Stated Goal: to breathe better OT Goal Formulation: With patient Time For Goal Achievement: 06/03/22 Potential to Achieve Goals: Good ADL Goals Pt Will Perform Grooming: Independently;standing Pt Will Perform Lower Body Dressing: Independently;sit to/from stand Pt Will Transfer to Toilet: Independently;ambulating;regular height toilet  Plan Discharge plan remains appropriate;Frequency remains appropriate    Co-evaluation                 AM-PAC OT "6 Clicks" Daily Activity     Outcome Measure   Help  from another person eating meals?: None Help from another person taking care of personal grooming?: A Little Help from another person toileting, which includes using toliet, bedpan, or urinal?: A Little Help from another person bathing (including washing, rinsing, drying)?: A Little Help from another person to put on and taking off regular upper body clothing?: A Little Help from another person to put on and taking off regular lower body clothing?: None 6 Click Score: 20    End of Session    OT Visit Diagnosis: Muscle weakness (generalized) (M62.81)   Activity Tolerance Patient tolerated treatment well   Patient Left in bed;with call bell/phone within reach;with bed alarm set   Nurse Communication          Time: 5284-1324 OT Time Calculation (min): 14 min  Charges: OT General Charges $OT Visit: 1 Visit OT Treatments $Self Care/Home Management : 8-22 mins  Kathie Dike, M.S. OTR/L  05/22/22, 2:41 PM  ascom [phone]

## 2022-05-22 NOTE — Progress Notes (Signed)
PT Cancellation Note  Patient Details Name: Taylinn Brabant MRN: [id number] DOB: [birth year]   Cancelled Treatment:    Reason Eval/Treat Not Completed: Other (comment).  Chart reviewed and attempted to see pt.  Pt stated she just got back in bed and did not want to see therapy today.  Even with maximal encouragement and education on basis of exercise, pt politely declined.  Pt stated that she would be glad to work with PT tomorrow.   Nolon Bussing, PT, DPT 05/22/22, 5:22 PM

## 2022-05-22 NOTE — Progress Notes (Signed)
PROGRESS NOTE    Elizabeth [name]  KYH:[id number] DOB: 1961-05-10 DOA: 05/14/2022 PCP: Lorn Junes, FNP    Brief Narrative:  Elizabeth [name] is a 61 y.o. female with medical history significant for COPD on 2-3L at night, CHF, chronic pain syndrome on chronic opiates, HTN who presented with COPD flare  Case discussed with pulmonology.  Consultation appreciated.  Severe COPD exacerbation.  Patient has a history of ICU admission for severe COPD flare approximately 1 year ago requiring endotracheal intubation.  Have been unable to wean from BiPAP.  On 8/9 patient had significant inspiratory and expiratory wheeze.  Was on BiPAP and around-the-clock bronchodilators.  On 8/10 respiratory status slowly improving but still unable to entirely wean from BiPAP.  8/11: Weaned off BiPAP.  Has been off BiPAP overnight on 3 L.  Still with some end expiratory wheeze and tachypnea but overall improved.   Assessment & Plan:   Principal Problem:   Acute respiratory failure with hypoxia (HCC) Active Problems:   COPD with acute exacerbation (HCC)   Chronic pain syndrome   Chronic diastolic CHF (congestive heart failure) (HCC)   HTN (hypertension)   Elevated serum creatinine   Chronic anemia   Anxiety  * Acute on chronic respiratory failure with hypoxia (HCC) Improving Weaned to baseline 3 L as of 8/11   COPD with acute exacerbation (HCC) Severe COPD with history of severe flares and endotracheal intubation in ICU 1 year ago Pulmonology engaged for comment, case discussed with Dr. Karna Christmas Plan: Continue IV Solu-Medrol for now Around-the-clock bronchodilators Anxiolytics and antitussives Mucolytic's Empiric antibiotics NIPPV as needed   Anxiety - Continue ativan, atarax   Chronic, continuous use of opioids --cont home Norco   Chronic anemia Stable at baseline   Elevated serum creatinine Acute kidney injury ruled out.     HTN (hypertension) --cont coreg and  aldactone   Chronic diastolic CHF (congestive heart failure) (HCC) - Continue torsmeide, spironolactone   Chronic pain syndrome - Continue morhpine, Vicodin    DVT prophylaxis: SQ Lovenox Code Status: Full Family Communication: None today Disposition Plan: Status is: Inpatient Remains inpatient appropriate because: Severe COPD exacerbation.  Respiratory failure.  Just weaned off BiPAP.  Anticipate 48 hours prior to discharge readiness.   Level of care: Progressive  Consultants:  Pulmonology Palliative care  Procedures:  BiPAP  Antimicrobials: Zithromax   Subjective: Seen and examined.  Still within the expiratory wheeze.  Still anxious.  Objective: Vitals:   05/22/22 0300 05/22/22 0650 05/22/22 0758 05/22/22 1146  BP: 111/83  121/75 (!) 113/59  Pulse: 64  61 66  Resp: 20  14 12   Temp: 98.4 F (36.9 C)  97.8 F (36.6 C) (!) 97.5 F (36.4 C)  TempSrc: Oral  Oral   SpO2: 97%  98% 98%  Weight:  59.5 kg      Intake/Output Summary (Last 24 hours) at 05/22/2022 1151 Last data filed at 05/22/2022 0409 Gross per 24 hour  Intake --  Output 950 ml  Net -950 ml   Filed Weights   05/20/22 0354 05/21/22 0417 05/22/22 0650  Weight: 59.5 kg 65.8 kg 59.5 kg    Examination:  General exam: Anxious appearing Respiratory system: Diminished breath sounds.  End expiratory wheeze.  3 L Cardiovascular system: S1-S2, RRR, no murmurs, no pedal edema Gastrointestinal system: Soft, NT/ND, normal bowel sounds Central nervous system: Alert and oriented. No focal neurological deficits. Extremities: Symmetric 5 x 5 power. Skin: No rashes, lesions or ulcers Psychiatry: Judgement and  insight appear normal. Mood & affect appropriate.     Data Reviewed: I have personally reviewed following labs and imaging studies  CBC: Recent Labs  Lab 05/17/22 0447 05/18/22 0322 05/19/22 0415 05/20/22 0437 05/21/22 0650  WBC 8.2 5.5 6.6 7.5 8.5  HGB 8.1* 8.7* 9.6* 9.5* 9.8*  HCT 27.7*  29.3* 32.6* 31.8* 32.5*  MCV 84.2 82.5 85.1 81.3 80.4  PLT 275 284 336 326 340   Basic Metabolic Panel: Recent Labs  Lab 05/16/22 0551 05/17/22 0447 05/18/22 0322 05/19/22 0415 05/20/22 0437 05/21/22 0650  NA 140 140 140 141 139 139  K 4.4 4.4 4.2 3.8 3.9 4.0  CL 105 107 105 101 101 100  CO2 28 26 28 26 27 30   GLUCOSE 127* 105* 123* 149* 123* 112*  BUN 27* 33* 36* 41* 43* 48*  CREATININE 0.71 0.71 0.93 0.99 0.88 0.86  CALCIUM 9.0 8.8* 8.5* 8.9 8.8* 8.9  MG 3.0* 3.1* 2.7* 2.7* 2.8*  --    GFR: Estimated Creatinine Clearance: 51 mL/min (by C-G formula based on SCr of 0.86 mg/dL). Liver Function Tests: Recent Labs  Lab 05/21/22 0650  AST 15  ALT 20  ALKPHOS 83  BILITOT 0.6  PROT 7.1  ALBUMIN 3.9   No results for input(s): "LIPASE", "AMYLASE" in the last 168 hours. No results for input(s): "AMMONIA" in the last 168 hours. Coagulation Profile: No results for input(s): "INR", "PROTIME" in the last 168 hours. Cardiac Enzymes: No results for input(s): "CKTOTAL", "CKMB", "CKMBINDEX", "TROPONINI" in the last 168 hours. BNP (last 3 results) No results for input(s): "PROBNP" in the last 8760 hours. HbA1C: No results for input(s): "HGBA1C" in the last 72 hours. CBG: Recent Labs  Lab 05/21/22 0823 05/21/22 1134 05/21/22 1715 05/22/22 0800 05/22/22 1147  GLUCAP 99 107* 139* 123* 177*   Lipid Profile: No results for input(s): "CHOL", "HDL", "LDLCALC", "TRIG", "CHOLHDL", "LDLDIRECT" in the last 72 hours. Thyroid Function Tests: No results for input(s): "TSH", "T4TOTAL", "FREET4", "T3FREE", "THYROIDAB" in the last 72 hours. Anemia Panel: No results for input(s): "VITAMINB12", "FOLATE", "FERRITIN", "TIBC", "IRON", "RETICCTPCT" in the last 72 hours. Sepsis Labs: No results for input(s): "PROCALCITON", "LATICACIDVEN" in the last 168 hours.  Recent Results (from the past 240 hour(s))  SARS Coronavirus 2 by RT PCR (hospital order, performed in [facility])  *cepheid single result test* Anterior Nasal Swab     Status: None   Collection Time: 05/16/22  6:31 PM   Specimen: Anterior Nasal Swab  Result Value Ref Range Status   SARS Coronavirus 2 by RT PCR NEGATIVE NEGATIVE Final    Comment: (NOTE) SARS-CoV-2 target nucleic acids are NOT DETECTED.  The SARS-CoV-2 RNA is generally detectable in upper and lower respiratory specimens during the acute phase of infection. The lowest concentration of SARS-CoV-2 viral copies this assay can detect is 250 copies / mL. A negative result does not preclude SARS-CoV-2 infection and should not be used as the sole basis for treatment or other patient management decisions.  A negative result may occur with improper specimen collection / handling, submission of specimen other than nasopharyngeal swab, presence of viral mutation(s) within the areas targeted by this assay, and inadequate number of viral copies (<250 copies / mL). A negative result must be combined with clinical observations, patient history, and epidemiological information.  Fact Sheet for Patients:   07/16/22  Fact Sheet for Healthcare Providers: RoadLapTop.co.za  This test is not yet approved or  cleared by the http://kim-miller.com/ FDA  and has been authorized for detection and/or diagnosis of SARS-CoV-2 by FDA under an Emergency Use Authorization (EUA).  This EUA will remain in effect (meaning this test can be used) for the duration of the COVID-19 declaration under Section 564(b)(1) of the Act, 21 U.S.C. section 360bbb-3(b)(1), unless the authorization is terminated or revoked sooner.  Performed at [facility], [address], [place], [place] [address]          Radiology Studies: No results found.      Scheduled Meds:  (feeding supplement) PROSource Plus  30 mL Oral TID BM   atorvastatin  40 mg Oral QHS   carvedilol  3.125 mg Oral BID WC   docusate sodium   100 mg Oral BID   enoxaparin (LOVENOX) injection  40 mg Subcutaneous Q24H   insulin aspart  0-15 Units Subcutaneous TID WC   ipratropium-albuterol  3 mL Nebulization TID   methylPREDNISolone (SOLU-MEDROL) injection  40 mg Intravenous Q24H   mometasone-formoterol  2 puff Inhalation BID   multivitamin with minerals  1 tablet Oral Daily   nicotine  14 mg Transdermal Daily   mouth rinse  15 mL Mouth Rinse 4 times per day   pantoprazole (PROTONIX) IV  40 mg Intravenous Q12H   potassium chloride SA  20 mEq Oral Daily   sodium chloride flush  3 mL Intravenous Q12H   spironolactone  25 mg Oral Daily   torsemide  20 mg Oral Daily   Continuous Infusions:   LOS: 8 days      Tresa Moore, MD [facility]   If 7PM-7AM, please contact night-coverage  05/22/2022, 11:51 AM

## 2022-05-22 NOTE — Progress Notes (Signed)
Patient maintained on 3/L Nasal Cannula during evening shift. Patient given PRN morphine twice.

## 2022-05-22 NOTE — Progress Notes (Signed)
NAME:  Elizabeth [name], MRN:  [id number], DOB:  [birth year], LOS: 8 ADMISSION DATE:  05/14/2022, CONSULTATION DATE:  04/18/21 REFERRING MD: Dr. Billie Ruddy, CHIEF COMPLAINT: Acute respiratory distress  Brief Pt Description / Synopsis:  61 year old female admitted with acute respiratory distress due to severe COPD exacerbation requiring BiPAP.  High risk for intubation.  History of Present Illness:  Elizabeth [name] is a 61 year old female with a past medical history as listed below who presented to [facility] ED on 05/14/22 due to shortness of breath and respiratory distress.  Pt was previously in respiratory distress on BiPAP  and requires constant attention from RN and continuous BIPAP use due to ongoing dyspnea despite full scope of therapy.   Has severe pan inspiratory and expiratory wheezing.  05/19/22 - patient remains on BIPAP with reduced settings.  She feels taht BIPAP allows her to rest and sleep but breathless without it. She is less swollen on aldactone and torsemide with preserved renal function.   05/20/22- reduced steroids to 40mg  IV daily.  She is wheezing less but still on minimal settings with BIPAP.  She does not want to take it off for comfort.  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  Recommend OT today.  05/21/22- remains on bipap but slowly imprpved.  05/22/22- Patient is off BIPAP , she is wheezing bilaterally but improved from prior.  Steroids are weaned to 40 IV daily.   Pertinent  Medical History  COPD Combined systolic and diastolic CHF (LVEF [id number] on echo in 2019) Hypertension Chronic pain syndrome Anxiety Tobacco abuse  Micro Data:  04/18/2021: SARS-CoV-2 and influenza PCR>> negative  Antimicrobials:  N/A    Objective   Blood pressure (!) 113/59, pulse 66, temperature (!) 97.5 F (36.4 C), resp. rate 12, weight 59.5 kg, SpO2 98 %.    FiO2 (%):  [30 %] 30 %   Intake/Output Summary (Last 24 hours) at 05/22/2022  1239 Last data filed at 05/22/2022 0409 Gross per 24 hour  Intake --  Output 950 ml  Net -950 ml    Filed Weights   05/20/22 0354 05/21/22 0417 05/22/22 0650  Weight: 59.5 kg 65.8 kg 59.5 kg    Examination: General: Acutely ill-appearing female, sitting in bed, with moderate respiratory distress despite being on BiPAP, very anxious HENT: Atraumatic, normocephalic, neck supple, no JVD Lungs: Inspiratory and expiratory wheezing throughout, no rhonchi auscultated, tachypnea with accessory muscle use on BiPAP, even Cardiovascular: Regular rate and rhythm, S1-S2, no murmurs, rubs, gallops, 2+ distal pulses Abdomen: Soft, nontender, nondistended, no guarding rebound tenderness, bowel sounds positive x4 Extremities: Normal bulk and tone, no deformities, no edema, good peripheral circulation Neuro: Awake, alert and oriented x4, moves all extremities to commands, no focal deficits, speech clear GU: Deferred Skin: Warm and dry.  No obvious rashes, lesions, ulcerations  Resolved Hospital Problem list     Assessment & Plan:   Severe Acute COPD Exacerbation -Supplemental O2 as needed to maintain O2 sats 88 to 92% -BiPAP, wean as tolerated -moderate risk for intubation -Follow intermittent Chest X-ray & ABG as needed -Bronchodilators q4h duoneb -IV Solu-Medrol 40 bid, at this time prednisone PO is not enough for her severe wheezing bilaterally.  -Prn Morphine for air hunger   Chronic combined Systolic & Diastolic CHF without acute exacerbation Hypertension -Continuous cardiac monitoring -Maintain MAP >65 -HS Troponin negative x2 (3 ~ 4) -BNP 67 -Most recent ECHO in 2019 with LVEF 45% and grade  1 Diastolic Dysfunction -will increase diuresis due to pitting LE edema with pan inspiratory wheezing. Currently on aldactone will add torsemide 20 daily   Generalized Anxiety Disorder Chronic Pain Syndrome -Provide supportive care -PRN Ativan/Xanax     Best Practice (right click and  "Reselect all SmartList Selections" daily)   Diet/type: NPO DVT prophylaxis: LMWH GI prophylaxis: PPI Lines: N/A Foley:  N/A Code Status:  full code Last date of multidisciplinary goals of care discussion [N/A]    Labs   CBC: Recent Labs  Lab 05/17/22 0447 05/18/22 0322 05/19/22 0415 05/20/22 0437 05/21/22 0650  WBC 8.2 5.5 6.6 7.5 8.5  HGB 8.1* 8.7* 9.6* 9.5* 9.8*  HCT 27.7* 29.3* 32.6* 31.8* 32.5*  MCV 84.2 82.5 85.1 81.3 80.4  PLT 275 284 336 326 340     Basic Metabolic Panel: Recent Labs  Lab 05/16/22 0551 05/17/22 0447 05/18/22 0322 05/19/22 0415 05/20/22 0437 05/21/22 0650  NA 140 140 140 141 139 139  K 4.4 4.4 4.2 3.8 3.9 4.0  CL 105 107 105 101 101 100  CO2 28 26 28 26 27 30   GLUCOSE 127* 105* 123* 149* 123* 112*  BUN 27* 33* 36* 41* 43* 48*  CREATININE 0.71 0.71 0.93 0.99 0.88 0.86  CALCIUM 9.0 8.8* 8.5* 8.9 8.8* 8.9  MG 3.0* 3.1* 2.7* 2.7* 2.8*  --     GFR: Estimated Creatinine Clearance: 51 mL/min (by C-G formula based on SCr of 0.86 mg/dL). Recent Labs  Lab 05/18/22 0322 05/19/22 0415 05/20/22 0437 05/21/22 0650  WBC 5.5 6.6 7.5 8.5     Liver Function Tests: Recent Labs  Lab 05/21/22 0650  AST 15  ALT 20  ALKPHOS 83  BILITOT 0.6  PROT 7.1  ALBUMIN 3.9    No results for input(s): "LIPASE", "AMYLASE" in the last 168 hours. No results for input(s): "AMMONIA" in the last 168 hours.  ABG    Component Value Date/Time   HCO3 26.0 05/14/2022 2013   ACIDBASEDEF 1.6 04/17/2021 1749   O2SAT 93.4 05/14/2022 2013     Coagulation Profile: No results for input(s): "INR", "PROTIME" in the last 168 hours.  Cardiac Enzymes: No results for input(s): "CKTOTAL", "CKMB", "CKMBINDEX", "TROPONINI" in the last 168 hours.  HbA1C: Hgb A1c MFr Bld  Date/Time Value Ref Range Status  05/15/2022 05:01 AM 5.9 (H) 4.8 - 5.6 % Final    Comment:    (NOTE) Pre diabetes:          5.7%-6.4%  Diabetes:              >6.4%  Glycemic control for    <7.0% adults with diabetes   01/04/2021 06:29 AM 5.9 (H) 4.8 - 5.6 % Final    Comment:    (NOTE) Pre diabetes:          5.7%-6.4%  Diabetes:              >6.4%  Glycemic control for   <7.0% adults with diabetes     CBG: Recent Labs  Lab 05/21/22 0823 05/21/22 1134 05/21/22 1715 05/22/22 0800 05/22/22 1147  GLUCAP 99 107* 139* 123* 177*     Review of Systems:   Positives in BOLD: Gen: Denies fever, chills, weight change, fatigue, night sweats HEENT: Denies blurred vision, double vision, hearing loss, tinnitus, sinus congestion, rhinorrhea, sore throat, neck stiffness, dysphagia PULM: Denies shortness of breath, cough, sputum production, hemoptysis, wheezing CV: Denies chest tightness, edema, orthopnea, paroxysmal nocturnal dyspnea, palpitations GI: Denies abdominal pain, nausea,  vomiting, diarrhea, hematochezia, melena, constipation, change in bowel habits GU: Denies dysuria, hematuria, polyuria, oliguria, urethral discharge Endocrine: Denies hot or cold intolerance, polyuria, polyphagia or appetite change Derm: Denies rash, dry skin, scaling or peeling skin change Heme: Denies easy bruising, bleeding, bleeding gums Neuro: Denies headache, numbness, weakness, slurred speech, loss of memory or consciousness    Past Medical History:  She,  has a past medical history of Anxiety, Arm pain (07/25/2015), CHF (congestive heart failure) (HCC), Congestive heart failure (HCC) (07/25/2015), COPD (chronic obstructive pulmonary disease) (HCC), Depression, Hypertension, Lower extremity pain (07/25/2015), Reflux, RSD (reflex sympathetic dystrophy), Spinal cord stimulator status, Vitamin B12 deficiency, and Vitamin D deficiency.   Surgical History:   Past Surgical History:  Procedure Laterality Date   SPINAL CORD STIMULATOR IMPLANT     x 2     Social History:   reports that she quit smoking about 4 years ago. Her smoking use included cigarettes. She has a 60.00 pack-year smoking  history. She has never used smokeless tobacco. She reports that she does not drink alcohol and does not use drugs.   Family History:  Her family history includes Cancer in her father; Cirrhosis in her mother.   Allergies Allergies  Allergen Reactions   Lisinopril Anaphylaxis     Home Medications  Prior to Admission medications   Medication Sig Start Date End Date Taking? Authorizing Provider  atorvastatin (LIPITOR) 40 MG tablet Take 1 tablet (40 mg total) by mouth at bedtime. 08/18/18  Yes Enid Baas, MD  carvedilol (COREG) 3.125 MG tablet Take 1 tablet (3.125 mg total) by mouth 2 (two) times daily with a meal. 08/18/18  Yes Enid Baas, MD  cetirizine (ZYRTEC) 10 MG tablet Take 10 mg by mouth daily.   Yes [provider]  furosemide (LASIX) 20 MG tablet Take 1 tablet (20 mg total) by mouth daily. Patient taking differently: Take 20 mg by mouth 2 (two) times daily. 08/18/18  Yes Enid Baas, MD  HYDROcodone-acetaminophen (NORCO/VICODIN) 5-325 MG tablet Take 1 tablet by mouth every 6 (six) hours as needed for severe pain. Must last 30 days. 03/23/21 04/22/21 Yes Delano Metz, MD  mometasone-formoterol (DULERA) 100-5 MCG/ACT AERO Inhale 2 puffs into the lungs 2 (two) times daily. 08/18/18 04/17/21 Yes Enid Baas, MD  pantoprazole (PROTONIX) 40 MG tablet Take 40 mg by mouth daily. 03/19/21  Yes [provider]  potassium chloride SA (K-DUR,KLOR-CON) 20 MEQ tablet Take 20 mEq by mouth daily.   Yes [provider]  spironolactone (ALDACTONE) 25 MG tablet Take 25 mg by mouth daily. 06/29/19 04/17/21 Yes [provider]  albuterol (PROVENTIL HFA;VENTOLIN HFA) 108 (90 Base) MCG/ACT inhaler Inhale 2 puffs into the lungs every 6 (six) hours as needed for wheezing or shortness of breath. 11/26/17   Enid Baas, MD  HYDROcodone-acetaminophen (NORCO/VICODIN) 5-325 MG tablet Take 1 tablet by mouth every 6 (six) hours as needed for severe  pain. Must last 30 days. 02/21/21 03/23/21  Delano Metz, MD  HYDROcodone-acetaminophen (NORCO/VICODIN) 5-325 MG tablet Take 1 tablet by mouth every 6 (six) hours as needed for severe pain. Must last 30 days. 04/22/21 05/22/21  Delano Metz, MD  ipratropium-albuterol (DUONEB) 0.5-2.5 (3) MG/3ML SOLN Take 3 mLs by nebulization every 6 (six) hours as needed (wheezing). 01/06/21   Enedina Finner, MD  omeprazole (PRILOSEC) 20 MG capsule Take 20 mg by mouth daily.  Patient not taking: Reported on 04/17/2021    [provider]  ramelteon (ROZEREM) 8 MG tablet Take 8 mg  by mouth at bedtime. Patient not taking: Reported on 04/17/2021    [provider]      Vida Rigger, M.D.  Pulmonary & Critical Care Medicine  [facility]

## 2022-05-23 DIAGNOSIS — J9601 Acute respiratory failure with hypoxia: Secondary | ICD-10-CM | POA: Diagnosis not present

## 2022-05-23 LAB — BASIC METABOLIC PANEL
Anion gap: 13 (ref 5–15)
BUN: 37 mg/dL — ABNORMAL HIGH (ref 8–23)
CO2: 29 mmol/L (ref 22–32)
Calcium: 8.9 mg/dL (ref 8.9–10.3)
Chloride: 95 mmol/L — ABNORMAL LOW (ref 98–111)
Creatinine, Ser: 0.9 mg/dL (ref 0.44–1.00)
GFR, Estimated: >60 mL/min (ref >60)
Glucose, Bld: 95 mg/dL (ref 70–99)
Potassium: 3.3 mmol/L — ABNORMAL LOW (ref 3.5–5.1)
Sodium: 137 mmol/L (ref 135–145)

## 2022-05-23 LAB — CBC WITH DIFFERENTIAL/PLATELET
Abs Immature Granulocytes: 0.3 10*3/uL — ABNORMAL HIGH (ref 0.00–0.07)
Basophils Absolute: 0 10*3/uL (ref 0.0–0.1)
Basophils Relative: 0 %
Eosinophils Absolute: 0.1 10*3/uL (ref 0.0–0.5)
Eosinophils Relative: 1 %
HCT: 33.9 % — ABNORMAL LOW (ref 36.0–46.0)
Hemoglobin: 10.3 g/dL — ABNORMAL LOW (ref 12.0–15.0)
Immature Granulocytes: 3 %
Lymphocytes Relative: 31 %
Lymphs Abs: 3.1 10*3/uL (ref 0.7–4.0)
MCH: 24.6 pg — ABNORMAL LOW (ref 26.0–34.0)
MCHC: 30.4 g/dL (ref 30.0–36.0)
MCV: 81.1 fL (ref 80.0–100.0)
Monocytes Absolute: 0.7 10*3/uL (ref 0.1–1.0)
Monocytes Relative: 7 %
Neutro Abs: 5.8 10*3/uL (ref 1.7–7.7)
Neutrophils Relative %: 58 %
Platelets: 334 10*3/uL (ref 150–400)
RBC: 4.18 MIL/uL (ref 3.87–5.11)
RDW: 14.7 % (ref 11.5–15.5)
WBC: 10 10*3/uL (ref 4.0–10.5)
nRBC: 0 % (ref 0.0–0.2)

## 2022-05-23 LAB — BLOOD GAS, ARTERIAL
Acid-Base Excess: 9.3 mmol/L — ABNORMAL HIGH (ref 0.0–2.0)
Bicarbonate: 34.2 mmol/L — ABNORMAL HIGH (ref 20.0–28.0)
FIO2: 0.32 %
O2 Content: 3 L/min
O2 Saturation: 99.1 %
Patient temperature: 37
pCO2 arterial: 47 mmHg (ref 32–48)
pH, Arterial: 7.47 — ABNORMAL HIGH (ref 7.35–7.45)
pO2, Arterial: 86 mmHg (ref 83–108)

## 2022-05-23 LAB — GLUCOSE, CAPILLARY
Glucose-Capillary: 112 mg/dL — ABNORMAL HIGH (ref 70–99)
Glucose-Capillary: 113 mg/dL — ABNORMAL HIGH (ref 70–99)
Glucose-Capillary: 130 mg/dL — ABNORMAL HIGH (ref 70–99)
Glucose-Capillary: 140 mg/dL — ABNORMAL HIGH (ref 70–99)
Glucose-Capillary: 172 mg/dL — ABNORMAL HIGH (ref 70–99)
Glucose-Capillary: 97 mg/dL (ref 70–99)

## 2022-05-23 MED ORDER — PANTOPRAZOLE SODIUM 40 MG PO TBEC
40.0000 mg | DELAYED_RELEASE_TABLET | Freq: Two times a day (BID) | ORAL | Status: DC
Start: 2022-05-23 — End: 2022-05-25
  Administered 2022-05-23 – 2022-05-25 (×4): 40 mg via ORAL
  Filled 2022-05-23 (×4): qty 1

## 2022-05-23 NOTE — Progress Notes (Signed)
PHARMACIST - PHYSICIAN COMMUNICATION   CONCERNING: IV to Oral Route Change Policy  RECOMMENDATION: This patient is receiving Protonix by the intravenous route.  Based on criteria approved by the Pharmacy and Therapeutics Committee, the intravenous medication(s) is/are being converted to the equivalent oral dose form(s).   DESCRIPTION: These criteria include: The patient is eating (either orally or via tube) and/or has been taking other orally administered medications for a least 24 hours The patient has no evidence of active gastrointestinal bleeding or impaired GI absorption (gastrectomy, short bowel, patient on TNA or NPO).   Gardner Candle, PharmD, BCPS Clinical Pharmacist 05/23/2022 10:59 AM

## 2022-05-23 NOTE — Progress Notes (Addendum)
PROGRESS NOTE    Elizabeth [name]  RNH:[id number] DOB: 1960/12/09 DOA: 05/14/2022 PCP: Bunnie Pion, FNP    Brief Narrative:  Elizabeth [name] is a 61 y.o. female with medical history significant for COPD on 2-3L at night, CHF, chronic pain syndrome on chronic opiates, HTN who presented with COPD flare  Case discussed with pulmonology.  Consultation appreciated.  Severe COPD exacerbation.  Patient has a history of ICU admission for severe COPD flare approximately 1 year ago requiring endotracheal intubation.  Have been unable to wean from BiPAP.  On 8/9 patient had significant inspiratory and expiratory wheeze.  Was on BiPAP and around-the-clock bronchodilators.  On 8/10 respiratory status slowly improving but still unable to entirely wean from BiPAP.  8/11: Weaned off BiPAP.  Has been off BiPAP overnight on 3 L.  Still with some end expiratory wheeze and tachypnea but overall improved.   Assessment & Plan:   Principal Problem:   Acute respiratory failure with hypoxia (HCC) Active Problems:   COPD with acute exacerbation (HCC)   Chronic pain syndrome   Chronic diastolic CHF (congestive heart failure) (HCC)   HTN (hypertension)   Elevated serum creatinine   Chronic anemia   Anxiety  * Acute on chronic respiratory failure with hypoxia (HCC) Improving Weaned to baseline 3 L as of 8/11   COPD with acute exacerbation (HCC) Severe COPD with history of severe flares and endotracheal intubation in ICU 1 year ago Pulmonology engaged for comment, case discussed with Dr. Lanney Gins Plan: Continue IV Solu-Medrol 40 mg daily Aggressive bronchodilator regimen Anxiolytics and antitussives Mucolytic's Empiric antibiotics NIPPV as needed   Anxiety Appears chronic Continue Ativan and Atarax   Chronic, continuous use of opioids --cont home Norco   Chronic anemia Stable at baseline   Elevated serum creatinine Acute kidney injury ruled out.     HTN  (hypertension) --cont coreg and aldactone   Chronic diastolic CHF (congestive heart failure) ([place]) - Continue torsmeide, spironolactone   Chronic pain syndrome - Continue morhpine, Vicodin  Ms. Toft continues to exhibit signs of acute on chronic respiratory failure with hypoxia due to COPD. The use of the NIV will treat patient's elevated PCO2 and can reduce risk of exacerbations and future hospitalizations when used at night and during the day. All alternate devices (X8329 and [id number], specifically BiPAP ST 10/5 with 12 backup rate) have been considered and ruled out as volume requirements are not met by BiLevel devices. An NIV with volume-targeted pressure support is necessary to prevent patient from life-threatening harm. Interruption or failure to provide NIV would quickly lead to exacerbation of the patient's condition, hospital re-admission, and likely harm to the patient. Continued use is preferred. Patient is able to protect their airways and clear secretions on their own.    DVT prophylaxis: SQ Lovenox Code Status: Full Family Communication: None today Disposition Plan: Status is: Inpatient Remains inpatient appropriate because: Severe COPD exacerbation.  Respiratory failure.  Just weaned off BiPAP.  Anticipate discharge once wheezing and air movement improves.  24 to 48 hours   Level of care: Progressive  Consultants:  Pulmonology Palliative care  Procedures:  BiPAP  Antimicrobials: Zithromax   Subjective: Seen and examined.  Still appears anxious.  Still with end expiratory wheeze.  Feeling better  Objective: Vitals:   05/23/22 0000 05/23/22 0351 05/23/22 0445 05/23/22 0804  BP: 108/61 109/61  102/64  Pulse: 67 65  71  Resp: $Remo'18 20  16  'iZJPt$ Temp: 98 F (36.7 C) 97.7 F (  36.5 C)  98.3 F (36.8 C)  TempSrc:    Oral  SpO2: 98% 99%  98%  Weight:   62.5 kg     Intake/Output Summary (Last 24 hours) at 05/23/2022 1120 Last data filed at 05/23/2022 0004 Gross per 24  hour  Intake 960 ml  Output 1625 ml  Net -665 ml   Filed Weights   05/21/22 0417 05/22/22 0650 05/23/22 0445  Weight: 65.8 kg 59.5 kg 62.5 kg    Examination:  General exam: Appears anxious Respiratory system: Decreased breath sounds bilaterally.  End expiratory wheeze.  Normal work of breathing.  3 L Cardiovascular system: S1-S2, RRR, no murmurs, no pedal edema Gastrointestinal system: Soft, NT/ND, normal bowel sounds Central nervous system: Alert and oriented. No focal neurological deficits. Extremities: Symmetric 5 x 5 power. Skin: No rashes, lesions or ulcers Psychiatry: Judgement and insight appear normal. Mood & affect appropriate.     Data Reviewed: I have personally reviewed following labs and imaging studies  CBC: Recent Labs  Lab 05/18/22 0322 05/19/22 0415 05/20/22 0437 05/21/22 0650 05/23/22 0750  WBC 5.5 6.6 7.5 8.5 10.0  NEUTROABS  --   --   --   --  5.8  HGB 8.7* 9.6* 9.5* 9.8* 10.3*  HCT 29.3* 32.6* 31.8* 32.5* 33.9*  MCV 82.5 85.1 81.3 80.4 81.1  PLT 284 336 326 340 419   Basic Metabolic Panel: Recent Labs  Lab 05/17/22 0447 05/18/22 0322 05/19/22 0415 05/20/22 0437 05/21/22 0650 05/23/22 0750  NA 140 140 141 139 139 137  K 4.4 4.2 3.8 3.9 4.0 3.3*  CL 107 105 101 101 100 95*  CO2 $Re'26 28 26 27 30 29  'lQY$ GLUCOSE 105* 123* 149* 123* 112* 95  BUN 33* 36* 41* 43* 48* 37*  CREATININE 0.71 0.93 0.99 0.88 0.86 0.90  CALCIUM 8.8* 8.5* 8.9 8.8* 8.9 8.9  MG 3.1* 2.7* 2.7* 2.8*  --   --    GFR: Estimated Creatinine Clearance: 49.9 mL/min (by C-G formula based on SCr of 0.9 mg/dL). Liver Function Tests: Recent Labs  Lab 05/21/22 0650  AST 15  ALT 20  ALKPHOS 83  BILITOT 0.6  PROT 7.1  ALBUMIN 3.9   No results for input(s): "LIPASE", "AMYLASE" in the last 168 hours. No results for input(s): "AMMONIA" in the last 168 hours. Coagulation Profile: No results for input(s): "INR", "PROTIME" in the last 168 hours. Cardiac Enzymes: No results for  input(s): "CKTOTAL", "CKMB", "CKMBINDEX", "TROPONINI" in the last 168 hours. BNP (last 3 results) No results for input(s): "PROBNP" in the last 8760 hours. HbA1C: No results for input(s): "HGBA1C" in the last 72 hours. CBG: Recent Labs  Lab 05/22/22 1615 05/22/22 2040 05/23/22 0000 05/23/22 0353 05/23/22 0810  GLUCAP 130* 144* 113* 112* 97   Lipid Profile: No results for input(s): "CHOL", "HDL", "LDLCALC", "TRIG", "CHOLHDL", "LDLDIRECT" in the last 72 hours. Thyroid Function Tests: No results for input(s): "TSH", "T4TOTAL", "FREET4", "T3FREE", "THYROIDAB" in the last 72 hours. Anemia Panel: No results for input(s): "VITAMINB12", "FOLATE", "FERRITIN", "TIBC", "IRON", "RETICCTPCT" in the last 72 hours. Sepsis Labs: No results for input(s): "PROCALCITON", "LATICACIDVEN" in the last 168 hours.  Recent Results (from the past 240 hour(s))  SARS Coronavirus 2 by RT PCR (hospital order, performed in [facility] hospital lab) *cepheid single result test* Anterior Nasal Swab     Status: None   Collection Time: 05/16/22  6:31 PM   Specimen: Anterior Nasal Swab  Result Value Ref Range Status  SARS Coronavirus 2 by RT PCR NEGATIVE NEGATIVE Final    Comment: (NOTE) SARS-CoV-2 target nucleic acids are NOT DETECTED.  The SARS-CoV-2 RNA is generally detectable in upper and lower respiratory specimens during the acute phase of infection. The lowest concentration of SARS-CoV-2 viral copies this assay can detect is 250 copies / mL. A negative result does not preclude SARS-CoV-2 infection and should not be used as the sole basis for treatment or other patient management decisions.  A negative result may occur with improper specimen collection / handling, submission of specimen other than nasopharyngeal swab, presence of viral mutation(s) within the areas targeted by this assay, and inadequate number of viral copies (<250 copies / mL). A negative result must be combined with  clinical observations, patient history, and epidemiological information.  Fact Sheet for Patients:   https://www.patel.info/  Fact Sheet for Healthcare Providers: https://hall.com/  This test is not yet approved or  cleared by the [place] FDA and has been authorized for detection and/or diagnosis of SARS-CoV-2 by FDA under an Emergency Use Authorization (EUA).  This EUA will remain in effect (meaning this test can be used) for the duration of the COVID-19 declaration under Section 564(b)(1) of the Act, 21 U.S.C. section 360bbb-3(b)(1), unless the authorization is terminated or revoked sooner.  Performed at [facility], [address] [place], [place] [address]          Radiology Studies: No results found.      Scheduled Meds:  (feeding supplement) PROSource Plus  30 mL Oral TID BM   atorvastatin  40 mg Oral QHS   carvedilol  3.125 mg Oral BID WC   docusate sodium  100 mg Oral BID   enoxaparin (LOVENOX) injection  40 mg Subcutaneous Q24H   insulin aspart  0-15 Units Subcutaneous TID WC   ipratropium-albuterol  3 mL Nebulization TID   methylPREDNISolone (SOLU-MEDROL) injection  40 mg Intravenous Q24H   mometasone-formoterol  2 puff Inhalation BID   multivitamin with minerals  1 tablet Oral Daily   nicotine  14 mg Transdermal Daily   mouth rinse  15 mL Mouth Rinse 4 times per day   pantoprazole  40 mg Oral BID   potassium chloride SA  20 mEq Oral Daily   sodium chloride flush  3 mL Intravenous Q12H   spironolactone  25 mg Oral Daily   torsemide  20 mg Oral Daily   Continuous Infusions:   LOS: 9 days      Sidney Ace, MD [facility]   If 7PM-7AM, please contact night-coverage  05/23/2022, 11:20 AM

## 2022-05-23 NOTE — TOC Progression Note (Addendum)
Transition of Care [facility]) - Progression Note    Patient Details  Name: Elizabeth [name] MRN: [id number] Date of Birth: [birth year]  [facility]) CM/SW Contact  Gildardo Griffes, [place] Phone Number: 05/23/2022, 9:25 AM  Clinical Narrative:     TOC continues to follow for needs, patient has declined SNF and unmotivated to work with PT 8/11, will continue to follow for discharge planning needs pending respiratory improvements.   Patient weaning off continuous bipap 8/11, on 3L O2.   CSW has identified patient may benefit from NIV, consulted Zach with [employer] who reports patient should qualify. MD updated, ABG ordered and narrative input by MD in his progress note. Pending ABG results to confirm patient will qualify for NIV, Zach with [employer] to follow.    Expected Discharge Plan: Home/Self Care Barriers to Discharge: Continued Medical Work up  Expected Discharge Plan and Services Expected Discharge Plan: Home/Self Care     Post Acute Care Choice: NA Living arrangements for the past 2 months: Single Family Home                                       Social Determinants of Health (SDOH) Interventions    Readmission Risk Interventions    05/16/2022   11:11 AM 04/22/2021   10:21 AM  Readmission Risk Prevention Plan  Transportation Screening Complete Complete  PCP or Specialist Appt within 3-5 Days Complete Complete  HRI or Home Care Consult  Complete  Social Work Consult for Recovery Care Planning/Counseling Complete Complete  Palliative Care Screening Not Applicable Not Applicable  Medication Review Oceanographer) Complete Referral to Pharmacy

## 2022-05-24 DIAGNOSIS — J9601 Acute respiratory failure with hypoxia: Secondary | ICD-10-CM | POA: Diagnosis not present

## 2022-05-24 LAB — GLUCOSE, CAPILLARY
Glucose-Capillary: 137 mg/dL — ABNORMAL HIGH (ref 70–99)
Glucose-Capillary: 119 mg/dL — ABNORMAL HIGH (ref 70–99)
Glucose-Capillary: 232 mg/dL — ABNORMAL HIGH (ref 70–99)
Glucose-Capillary: 128 mg/dL — ABNORMAL HIGH (ref 70–99)

## 2022-05-24 NOTE — Progress Notes (Signed)
PROGRESS NOTE    Elizabeth [name]  MLR:[id number] DOB: May 12, 1961 DOA: 05/14/2022 PCP: Lorn Junes, FNP    Brief Narrative:  Elizabeth [name] is a 61 y.o. female with medical history significant for COPD on 2-3L at night, CHF, chronic pain syndrome on chronic opiates, HTN who presented with COPD flare  Case discussed with pulmonology.  Consultation appreciated.  Severe COPD exacerbation.  Patient has a history of ICU admission for severe COPD flare approximately 1 year ago requiring endotracheal intubation.  Have been unable to wean from BiPAP.  On 8/9 patient had significant inspiratory and expiratory wheeze.  Was on BiPAP and around-the-clock bronchodilators.  On 8/10 respiratory status slowly improving but still unable to entirely wean from BiPAP.  8/11: Weaned off BiPAP.  Has been off BiPAP overnight on 3 L.  Still with some end expiratory wheeze and tachypnea but overall improved.  8/13: Has remained off BiPAP.  Still with mild end expiratory wheeze but air movement improving.   Assessment & Plan:   Principal Problem:   Acute respiratory failure with hypoxia (HCC) Active Problems:   COPD with acute exacerbation (HCC)   Chronic pain syndrome   Chronic diastolic CHF (congestive heart failure) (HCC)   HTN (hypertension)   Elevated serum creatinine   Chronic anemia   Anxiety  * Acute on chronic respiratory failure with hypoxia (HCC) Improving Weaned to baseline 3 L as of 8/11   COPD with acute exacerbation (HCC) Severe COPD with history of severe flares and endotracheal intubation in ICU 1 year ago Pulmonology engaged for comment, case discussed with Dr. Karna Christmas ABG done 8/12 for NIV planning Plan: P.o. steroids, continue to wean Aggressive bronchodilator regimen Anxiolytics and antitussives Mucolytic's Empiric antibiotics NIPPV as needed Ambulatory desat today   Anxiety Appears chronic Continue Ativan and Atarax   Chronic, continuous use of  opioids --cont home Norco   Chronic anemia Stable at baseline   Elevated serum creatinine Acute kidney injury ruled out.     HTN (hypertension) --cont coreg and aldactone   Chronic diastolic CHF (congestive heart failure) (HCC) - Continue torsmeide, spironolactone   Chronic pain syndrome - Continue morhpine, Vicodin  Elizabeth [name] continues to exhibit signs of acute on chronic respiratory failure with hypoxia due to COPD. The use of the NIV will treat patient's elevated PCO2 and can reduce risk of exacerbations and future hospitalizations when used at night and during the day. All alternate devices (L1679 and [id number], specifically BiPAP ST 10/5 with 12 backup rate) have been considered and ruled out as volume requirements are not met by BiLevel devices. An NIV with volume-targeted pressure support is necessary to prevent patient from life-threatening harm. Interruption or failure to provide NIV would quickly lead to exacerbation of the patient's condition, hospital re-admission, and likely harm to the patient. Continued use is preferred. Patient is able to protect their airways and clear secretions on their own.    DVT prophylaxis: SQ Lovenox Code Status: Full Family Communication: None today Disposition Plan: Status is: Inpatient Remains inpatient appropriate because: Severe COPD exacerbation.  Respiratory failure.  Has remained off BiPAP.  Anticipated date of discharge 8/14   Level of care: Progressive  Consultants:  Pulmonology Palliative care  Procedures:  BiPAP  Antimicrobials: Zithromax   Subjective: Seen and examined.  Still appears anxious.  Still with end expiratory wheeze.  Feeling better  Objective: Vitals:   05/23/22 1955 05/23/22 2335 05/24/22 0437 05/24/22 0836  BP:  106/61 108/66 115/61  Pulse:  69 63 62  Resp:  $Remo'16 16 16  'aXhWD$ Temp:  98.3 F (36.8 C) 97.8 F (36.6 C) 98.1 F (36.7 C)  TempSrc:    Oral  SpO2: 95% 98% 97% 99%  Weight:         Intake/Output Summary (Last 24 hours) at 05/24/2022 1049 Last data filed at 05/23/2022 2350 Gross per 24 hour  Intake 483 ml  Output 1850 ml  Net -1367 ml   Filed Weights   05/21/22 0417 05/22/22 0650 05/23/22 0445  Weight: 65.8 kg 59.5 kg 62.5 kg    Examination:  General exam: NAD.  Mildly anxious Respiratory system: Diminished breath sounds bilaterally.  End expiratory wheeze.  Normal work of breathing.  3 L Cardiovascular system: S1-S2, RRR, no murmurs, no pedal edema Gastrointestinal system: Soft, NT/ND, normal bowel sounds Central nervous system: Alert and oriented. No focal neurological deficits. Extremities: Symmetric 5 x 5 power. Skin: No rashes, lesions or ulcers Psychiatry: Judgement and insight appear normal. Mood & affect appropriate.     Data Reviewed: I have personally reviewed following labs and imaging studies  CBC: Recent Labs  Lab 05/18/22 0322 05/19/22 0415 05/20/22 0437 05/21/22 0650 05/23/22 0750  WBC 5.5 6.6 7.5 8.5 10.0  NEUTROABS  --   --   --   --  5.8  HGB 8.7* 9.6* 9.5* 9.8* 10.3*  HCT 29.3* 32.6* 31.8* 32.5* 33.9*  MCV 82.5 85.1 81.3 80.4 81.1  PLT 284 336 326 340 017   Basic Metabolic Panel: Recent Labs  Lab 05/18/22 0322 05/19/22 0415 05/20/22 0437 05/21/22 0650 05/23/22 0750  NA 140 141 139 139 137  K 4.2 3.8 3.9 4.0 3.3*  CL 105 101 101 100 95*  CO2 $Re'28 26 27 30 29  'tZh$ GLUCOSE 123* 149* 123* 112* 95  BUN 36* 41* 43* 48* 37*  CREATININE 0.93 0.99 0.88 0.86 0.90  CALCIUM 8.5* 8.9 8.8* 8.9 8.9  MG 2.7* 2.7* 2.8*  --   --    GFR: Estimated Creatinine Clearance: 49.9 mL/min (by C-G formula based on SCr of 0.9 mg/dL). Liver Function Tests: Recent Labs  Lab 05/21/22 0650  AST 15  ALT 20  ALKPHOS 83  BILITOT 0.6  PROT 7.1  ALBUMIN 3.9   No results for input(s): "LIPASE", "AMYLASE" in the last 168 hours. No results for input(s): "AMMONIA" in the last 168 hours. Coagulation Profile: No results for input(s): "INR",  "PROTIME" in the last 168 hours. Cardiac Enzymes: No results for input(s): "CKTOTAL", "CKMB", "CKMBINDEX", "TROPONINI" in the last 168 hours. BNP (last 3 results) No results for input(s): "PROBNP" in the last 8760 hours. HbA1C: No results for input(s): "HGBA1C" in the last 72 hours. CBG: Recent Labs  Lab 05/23/22 0810 05/23/22 1213 05/23/22 1624 05/23/22 2107 05/24/22 0836  GLUCAP 97 130* 172* 140* 137*   Lipid Profile: No results for input(s): "CHOL", "HDL", "LDLCALC", "TRIG", "CHOLHDL", "LDLDIRECT" in the last 72 hours. Thyroid Function Tests: No results for input(s): "TSH", "T4TOTAL", "FREET4", "T3FREE", "THYROIDAB" in the last 72 hours. Anemia Panel: No results for input(s): "VITAMINB12", "FOLATE", "FERRITIN", "TIBC", "IRON", "RETICCTPCT" in the last 72 hours. Sepsis Labs: No results for input(s): "PROCALCITON", "LATICACIDVEN" in the last 168 hours.  Recent Results (from the past 240 hour(s))  SARS Coronavirus 2 by RT PCR (hospital order, performed in [facility] hospital lab) *cepheid single result test* Anterior Nasal Swab     Status: None   Collection Time: 05/16/22  6:31 PM   Specimen: Anterior Nasal Swab  Result Value Ref Range Status   SARS Coronavirus 2 by RT PCR NEGATIVE NEGATIVE Final    Comment: (NOTE) SARS-CoV-2 target nucleic acids are NOT DETECTED.  The SARS-CoV-2 RNA is generally detectable in upper and lower respiratory specimens during the acute phase of infection. The lowest concentration of SARS-CoV-2 viral copies this assay can detect is 250 copies / mL. A negative result does not preclude SARS-CoV-2 infection and should not be used as the sole basis for treatment or other patient management decisions.  A negative result may occur with improper specimen collection / handling, submission of specimen other than nasopharyngeal swab, presence of viral mutation(s) within the areas targeted by this assay, and inadequate number of viral copies (<250 copies  / mL). A negative result must be combined with clinical observations, patient history, and epidemiological information.  Fact Sheet for Patients:   https://www.patel.info/  Fact Sheet for Healthcare Providers: https://hall.com/  This test is not yet approved or  cleared by the [place] FDA and has been authorized for detection and/or diagnosis of SARS-CoV-2 by FDA under an Emergency Use Authorization (EUA).  This EUA will remain in effect (meaning this test can be used) for the duration of the COVID-19 declaration under Section 564(b)(1) of the Act, 21 U.S.C. section 360bbb-3(b)(1), unless the authorization is terminated or revoked sooner.  Performed at [facility], [address], [place], [place] [address]          Radiology Studies: No results found.      Scheduled Meds:  (feeding supplement) PROSource Plus  30 mL Oral TID BM   atorvastatin  40 mg Oral QHS   carvedilol  3.125 mg Oral BID WC   docusate sodium  100 mg Oral BID   enoxaparin (LOVENOX) injection  40 mg Subcutaneous Q24H   insulin aspart  0-15 Units Subcutaneous TID WC   ipratropium-albuterol  3 mL Nebulization TID   methylPREDNISolone (SOLU-MEDROL) injection  40 mg Intravenous Q24H   mometasone-formoterol  2 puff Inhalation BID   multivitamin with minerals  1 tablet Oral Daily   nicotine  14 mg Transdermal Daily   mouth rinse  15 mL Mouth Rinse 4 times per day   pantoprazole  40 mg Oral BID   potassium chloride SA  20 mEq Oral Daily   sodium chloride flush  3 mL Intravenous Q12H   spironolactone  25 mg Oral Daily   torsemide  20 mg Oral Daily   Continuous Infusions:   LOS: 10 days      Sidney Ace, MD [facility]   If 7PM-7AM, please contact night-coverage  05/24/2022, 10:49 AM

## 2022-05-24 NOTE — Care Management Note (Signed)
SATURATION QUALIFICATIONS: (This note is used to comply with regulatory documentation for home oxygen)  Patient Saturations on Room Air at Rest = 94%  Patient Saturations on Room Air while Ambulating = 87%  Patient Saturations on 3 Liters of oxygen while Ambulating = 95%  Please briefly explain why patient needs home oxygen: Patient states she already is  on home o2

## 2022-05-24 NOTE — TOC Progression Note (Addendum)
Transition of Care [facility]) - Progression Note    Patient Details  Name: Elizabeth [name] MRN: [id number] Date of Birth: [birth year]  [facility] CM/SW Contact  Bing Quarry, RN Phone Number: 05/24/2022, 9:57 AM  Clinical Narrative:  8/13: ABG results in and narrative entered by provider. Contacted Zach at [employer] and he will contact pulmonary for potential order for home NIV and provider indicated that home set up on Monday was okay. Let [employer] know this as well. Nothing else needed at this point for NIV process per [employer]. Will need oxygen saturation ambulation test for any home oxygen needs. Gabriel Cirri RN CM   Per [employer]: Pulmonary provider will sign NIV order via [employer] direct to [employer]. Set up for Monday 05/25/22. Gabriel Cirri RN CM     Expected Discharge Plan: Home/Self Care Barriers to Discharge: Continued Medical Work up  Expected Discharge Plan and Services Expected Discharge Plan: Home/Self Care     Post Acute Care Choice: NA Living arrangements for the past 2 months: Single Family Home                                       Social Determinants of Health (SDOH) Interventions    Readmission Risk Interventions    05/16/2022   11:11 AM 04/22/2021   10:21 AM  Readmission Risk Prevention Plan  Transportation Screening Complete Complete  PCP or Specialist Appt within 3-5 Days Complete Complete  HRI or Home Care Consult  Complete  Social Work Consult for Recovery Care Planning/Counseling Complete Complete  Palliative Care Screening Not Applicable Not Applicable  Medication Review Oceanographer) Complete Referral to Pharmacy

## 2022-05-25 DIAGNOSIS — J9601 Acute respiratory failure with hypoxia: Secondary | ICD-10-CM | POA: Diagnosis not present

## 2022-05-25 LAB — GLUCOSE, CAPILLARY
Glucose-Capillary: 130 mg/dL — ABNORMAL HIGH (ref 70–99)
Glucose-Capillary: 189 mg/dL — ABNORMAL HIGH (ref 70–99)

## 2022-05-25 MED ORDER — LORAZEPAM 1 MG PO TABS: 1.0000 mg | ORAL_TABLET | Freq: Three times a day (TID) | ORAL | 0 refills | Status: DC | PRN

## 2022-05-25 MED ORDER — PREDNISONE 20 MG PO TABS
40.0000 mg | ORAL_TABLET | Freq: Every day | ORAL | Status: DC
  Administered 2022-05-25: 40 mg via ORAL
  Filled 2022-05-25: qty 2

## 2022-05-25 MED ORDER — PREDNISONE 5 MG PO TABS: ORAL_TABLET | ORAL | 0 refills | Status: DC

## 2022-05-25 MED ORDER — TORSEMIDE 20 MG PO TABS: 20.0000 mg | ORAL_TABLET | Freq: Every day | ORAL | 0 refills | Status: AC

## 2022-05-25 MED ORDER — NICOTINE 14 MG/24HR TD PT24: 14.0000 mg | MEDICATED_PATCH | Freq: Every day | TRANSDERMAL | 0 refills | Status: DC

## 2022-05-25 MED ORDER — HYDROCODONE-ACETAMINOPHEN 5-325 MG PO TABS: 1.0000 | ORAL_TABLET | Freq: Four times a day (QID) | ORAL | 0 refills | Status: DC | PRN

## 2022-05-25 NOTE — Progress Notes (Signed)
NAME:  Elizabeth [name], MRN:  [id number], DOB:  1961-05-02, LOS: 42 ADMISSION DATE:  05/14/2022, CONSULTATION DATE:  04/18/21 REFERRING MD: Dr. Billie Ruddy, CHIEF COMPLAINT: Acute respiratory distress  Brief Pt Description / Synopsis:  61 year old female admitted with acute respiratory distress due to severe COPD exacerbation requiring BiPAP.  High risk for intubation.  History of Present Illness:  Elizabeth [name] is a 61 year old female with a past medical history as listed below who presented to [facility] ED on 05/14/22 due to shortness of breath and respiratory distress.  Pt was previously in respiratory distress on BiPAP  and requires constant attention from RN and continuous BIPAP use due to ongoing dyspnea despite full scope of therapy.   Has severe pan inspiratory and expiratory wheezing.  05/19/22 - patient remains on BIPAP with reduced settings.  She feels taht BIPAP allows her to rest and sleep but breathless without it. She is less swollen on aldactone and torsemide with preserved renal function.   05/20/22- reduced steroids to 40mg  IV daily.  She is wheezing less but still on minimal settings with BIPAP.  She does not want to take it off for comfort.  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  Recommend OT today.  05/21/22- remains on bipap but slowly imprpved.  05/22/22- Patient is off BIPAP , she is wheezing bilaterally but improved from prior.  Steroids are weaned to 40 IV daily.  05/25/22- patient is wheezing bilaterally but is close to baseline and is requesting dc home. I have agreed to see her in <1 week on outpatient. Plan to wean prednisone by 5mg  weekly over next 8wk. May dc diuretics for now  Pertinent  Medical History  COPD Combined systolic and diastolic CHF (LVEF [id number] on echo in 2019) Hypertension Chronic pain syndrome Anxiety Tobacco abuse  Micro Data:  04/18/2021: SARS-CoV-2 and influenza PCR>>  negative  Antimicrobials:  N/A    Objective   Blood pressure 125/73, pulse 64, temperature 98.2 F (36.8 C), resp. rate 16, weight 62.6 kg, SpO2 100 %.        Intake/Output Summary (Last 24 hours) at 05/25/2022 0748 Last data filed at 05/25/2022 0558 Gross per 24 hour  Intake 300 ml  Output 350 ml  Net -50 ml    Filed Weights   05/22/22 0650 05/23/22 0445 05/25/22 0551  Weight: 59.5 kg 62.5 kg 62.6 kg    Examination: General: Acutely ill-appearing female, sitting in bed, with moderate respiratory distress despite being on BiPAP, very anxious HENT: Atraumatic, normocephalic, neck supple, no JVD Lungs: Inspiratory and expiratory wheezing throughout, no rhonchi auscultated, tachypnea with accessory muscle use on BiPAP, even Cardiovascular: Regular rate and rhythm, S1-S2, no murmurs, rubs, gallops, 2+ distal pulses Abdomen: Soft, nontender, nondistended, no guarding rebound tenderness, bowel sounds positive x4 Extremities: Normal bulk and tone, no deformities, no edema, good peripheral circulation Neuro: Awake, alert and oriented x4, moves all extremities to commands, no focal deficits, speech clear GU: Deferred Skin: Warm and dry.  No obvious rashes, lesions, ulcerations  Resolved Hospital Problem list     Assessment & Plan:   Severe Acute COPD Exacerbation -Supplemental O2 as needed to maintain O2 sats 88 to 92% -BiPAP, wean as tolerated -moderate risk for intubation -Follow intermittent Chest X-ray & ABG as needed -Bronchodilators q4h duoneb -IV Solu-Medrol 40 bid, at this time prednisone PO is not enough for her severe wheezing bilaterally.  -Prn Morphine for air hunger  Chronic combined Systolic & Diastolic CHF without acute exacerbation Hypertension -Continuous cardiac monitoring -Maintain MAP >65 -HS Troponin negative x2 (3 ~ 4) -BNP 67 -Most recent ECHO in 2019 with LVEF 45% and grade 1 Diastolic Dysfunction -will increase diuresis due to pitting LE edema  with pan inspiratory wheezing. Currently on aldactone will add torsemide 20 daily   Generalized Anxiety Disorder Chronic Pain Syndrome -Provide supportive care -PRN Ativan/Xanax     Best Practice (right click and "Reselect all SmartList Selections" daily)   Diet/type: NPO DVT prophylaxis: LMWH GI prophylaxis: PPI Lines: N/A Foley:  N/A Code Status:  full code Last date of multidisciplinary goals of care discussion [N/A]    Labs   CBC: Recent Labs  Lab 05/19/22 0415 05/20/22 0437 05/21/22 0650 05/23/22 0750  WBC 6.6 7.5 8.5 10.0  NEUTROABS  --   --   --  5.8  HGB 9.6* 9.5* 9.8* 10.3*  HCT 32.6* 31.8* 32.5* 33.9*  MCV 85.1 81.3 80.4 81.1  PLT 336 326 340 334     Basic Metabolic Panel: Recent Labs  Lab 05/19/22 0415 05/20/22 0437 05/21/22 0650 05/23/22 0750  NA 141 139 139 137  K 3.8 3.9 4.0 3.3*  CL 101 101 100 95*  CO2 26 27 30 29   GLUCOSE 149* 123* 112* 95  BUN 41* 43* 48* 37*  CREATININE 0.99 0.88 0.86 0.90  CALCIUM 8.9 8.8* 8.9 8.9  MG 2.7* 2.8*  --   --     GFR: Estimated Creatinine Clearance: 49.9 mL/min (by C-G formula based on SCr of 0.9 mg/dL). Recent Labs  Lab 05/19/22 0415 05/20/22 0437 05/21/22 0650 05/23/22 0750  WBC 6.6 7.5 8.5 10.0     Liver Function Tests: Recent Labs  Lab 05/21/22 0650  AST 15  ALT 20  ALKPHOS 83  BILITOT 0.6  PROT 7.1  ALBUMIN 3.9    No results for input(s): "LIPASE", "AMYLASE" in the last 168 hours. No results for input(s): "AMMONIA" in the last 168 hours.  ABG    Component Value Date/Time   PHART 7.47 (H) 05/23/2022 1300   PCO2ART 47 05/23/2022 1300   PO2ART 86 05/23/2022 1300   HCO3 34.2 (H) 05/23/2022 1300   ACIDBASEDEF 1.6 04/17/2021 1749   O2SAT 99.1 05/23/2022 1300     Coagulation Profile: No results for input(s): "INR", "PROTIME" in the last 168 hours.  Cardiac Enzymes: No results for input(s): "CKTOTAL", "CKMB", "CKMBINDEX", "TROPONINI" in the last 168 hours.  HbA1C: Hgb A1c  MFr Bld  Date/Time Value Ref Range Status  05/15/2022 05:01 AM 5.9 (H) 4.8 - 5.6 % Final    Comment:    (NOTE) Pre diabetes:          5.7%-6.4%  Diabetes:              >6.4%  Glycemic control for   <7.0% adults with diabetes   01/04/2021 06:29 AM 5.9 (H) 4.8 - 5.6 % Final    Comment:    (NOTE) Pre diabetes:          5.7%-6.4%  Diabetes:              >6.4%  Glycemic control for   <7.0% adults with diabetes     CBG: Recent Labs  Lab 05/24/22 0836 05/24/22 1253 05/24/22 1658 05/24/22 2024 05/25/22 0725  GLUCAP 137* 119* 232* 128* 130*     Review of Systems:   Positives in BOLD: Gen: Denies fever, chills, weight change, fatigue, night sweats HEENT: Denies  blurred vision, double vision, hearing loss, tinnitus, sinus congestion, rhinorrhea, sore throat, neck stiffness, dysphagia PULM: Denies shortness of breath, cough, sputum production, hemoptysis, wheezing CV: Denies chest tightness, edema, orthopnea, paroxysmal nocturnal dyspnea, palpitations GI: Denies abdominal pain, nausea, vomiting, diarrhea, hematochezia, melena, constipation, change in bowel habits GU: Denies dysuria, hematuria, polyuria, oliguria, urethral discharge Endocrine: Denies hot or cold intolerance, polyuria, polyphagia or appetite change Derm: Denies rash, dry skin, scaling or peeling skin change Heme: Denies easy bruising, bleeding, bleeding gums Neuro: Denies headache, numbness, weakness, slurred speech, loss of memory or consciousness    Past Medical History:  She,  has a past medical history of Anxiety, Arm pain (07/25/2015), CHF (congestive heart failure) (HCC), Congestive heart failure (HCC) (07/25/2015), COPD (chronic obstructive pulmonary disease) (HCC), Depression, Hypertension, Lower extremity pain (07/25/2015), Reflux, RSD (reflex sympathetic dystrophy), Spinal cord stimulator status, Vitamin B12 deficiency, and Vitamin D deficiency.   Surgical History:   Past Surgical History:   Procedure Laterality Date   SPINAL CORD STIMULATOR IMPLANT     x 2     Social History:   reports that she quit smoking about 4 years ago. Her smoking use included cigarettes. She has a 60.00 pack-year smoking history. She has never used smokeless tobacco. She reports that she does not drink alcohol and does not use drugs.   Family History:  Her family history includes Cancer in her father; Cirrhosis in her mother.   Allergies Allergies  Allergen Reactions   Lisinopril Anaphylaxis     Home Medications  Prior to Admission medications   Medication Sig Start Date End Date Taking? Authorizing Provider  atorvastatin (LIPITOR) 40 MG tablet Take 1 tablet (40 mg total) by mouth at bedtime. 08/18/18  Yes Enid Baas, MD  carvedilol (COREG) 3.125 MG tablet Take 1 tablet (3.125 mg total) by mouth 2 (two) times daily with a meal. 08/18/18  Yes Enid Baas, MD  cetirizine (ZYRTEC) 10 MG tablet Take 10 mg by mouth daily.   Yes [provider]  furosemide (LASIX) 20 MG tablet Take 1 tablet (20 mg total) by mouth daily. Patient taking differently: Take 20 mg by mouth 2 (two) times daily. 08/18/18  Yes Enid Baas, MD  HYDROcodone-acetaminophen (NORCO/VICODIN) 5-325 MG tablet Take 1 tablet by mouth every 6 (six) hours as needed for severe pain. Must last 30 days. 03/23/21 04/22/21 Yes Delano Metz, MD  mometasone-formoterol (DULERA) 100-5 MCG/ACT AERO Inhale 2 puffs into the lungs 2 (two) times daily. 08/18/18 04/17/21 Yes Enid Baas, MD  pantoprazole (PROTONIX) 40 MG tablet Take 40 mg by mouth daily. 03/19/21  Yes [provider]  potassium chloride SA (K-DUR,KLOR-CON) 20 MEQ tablet Take 20 mEq by mouth daily.   Yes [provider]  spironolactone (ALDACTONE) 25 MG tablet Take 25 mg by mouth daily. 06/29/19 04/17/21 Yes [provider]  albuterol (PROVENTIL HFA;VENTOLIN HFA) 108 (90 Base) MCG/ACT inhaler Inhale 2 puffs into the lungs every 6  (six) hours as needed for wheezing or shortness of breath. 11/26/17   Enid Baas, MD  HYDROcodone-acetaminophen (NORCO/VICODIN) 5-325 MG tablet Take 1 tablet by mouth every 6 (six) hours as needed for severe pain. Must last 30 days. 02/21/21 03/23/21  Delano Metz, MD  HYDROcodone-acetaminophen (NORCO/VICODIN) 5-325 MG tablet Take 1 tablet by mouth every 6 (six) hours as needed for severe pain. Must last 30 days. 04/22/21 05/22/21  Delano Metz, MD  ipratropium-albuterol (DUONEB) 0.5-2.5 (3) MG/3ML SOLN Take 3 mLs by nebulization every 6 (six) hours as needed (wheezing).  01/06/21   Fritzi Mandes, MD  omeprazole (PRILOSEC) 20 MG capsule Take 20 mg by mouth daily.  Patient not taking: Reported on 04/17/2021    [provider]  ramelteon (ROZEREM) 8 MG tablet Take 8 mg by mouth at bedtime. Patient not taking: Reported on 04/17/2021    [provider]      Ottie Glazier, M.D.  [facility]

## 2022-05-25 NOTE — Discharge Summary (Signed)
Physician Discharge Summary  Denaya Horn Friedt FWY:[id number] DOB: 03-27-1961 DOA: 05/14/2022  PCP: Bunnie Pion, FNP  Admit date: 05/14/2022 Discharge date: 05/25/2022  Admitted From: Home Disposition:  Home  Recommendations for Outpatient Follow-up:  Follow up with PCP in 1-2 weeks Follow up with pulmonary Referral to outpatient palliative care  Home Health: No, offered however patient declined Equipment/Devices: Oxygen via nasal cannula 3 L  Discharge Condition: Stable CODE STATUS: Full Diet recommendation: Heart healthy  Brief/Interim Summary: Clarece Drzewiecki is a 61 y.o. female with medical history significant for COPD on 2-3L at night, CHF, chronic pain syndrome on chronic opiates, HTN who presented with COPD flare   Case discussed with pulmonology.  Consultation appreciated.  Severe COPD exacerbation.  Patient has a history of ICU admission for severe COPD flare approximately 1 year ago requiring endotracheal intubation.  Have been unable to wean from BiPAP.  On 8/9 patient had significant inspiratory and expiratory wheeze.  Was on BiPAP and around-the-clock bronchodilators.  On 8/10 respiratory status slowly improving but still unable to entirely wean from BiPAP.   8/11: Weaned off BiPAP.  Has been off BiPAP overnight on 3 L.  Still with some end expiratory wheeze and tachypnea but overall improved.   8/13: Has remained off BiPAP.  Still with mild end expiratory wheeze but air movement improving.  8/14: Has remained off BiPAP.  Still with mild end expiratory wheeze but air movement much improved.  On 3 L.  Ambulated without desaturation.  Stable for discharge home at this time.    Discharge Diagnoses:  Principal Problem:   Acute respiratory failure with hypoxia (HCC) Active Problems:   COPD with acute exacerbation (HCC)   Chronic pain syndrome   Chronic diastolic CHF (congestive heart failure) (HCC)   HTN (hypertension)   Elevated serum creatinine   Chronic  anemia   Anxiety  * Acute on chronic respiratory failure with hypoxia (HCC) Improving Weaned to baseline 3 L as of 8/11   COPD with acute exacerbation (HCC) Severe COPD with history of severe flares and endotracheal intubation in ICU 1 year ago Pulmonology engaged for comment, case discussed with Dr. Lanney Gins ABG done 8/12 for NIV planning Plan: Discharge home Course of p.o. prednisone Bronchodilators Home NIV Follow-up outpatient pulmonology   Anxiety Appears chronic Continue Ativan and Atarax   Chronic, continuous use of opioids --cont home Norco   Chronic anemia Stable at baseline   Elevated serum creatinine Acute kidney injury ruled out.     HTN (hypertension) --cont coreg and aldactone   Chronic diastolic CHF (congestive heart failure) ([place]) - Continue torsmeide, spironolactone    Ms. Mittleman continues to exhibit signs of acute on chronic respiratory failure with hypoxia due to COPD. The use of the NIV will treat patient's elevated PCO2 and can reduce risk of exacerbations and future hospitalizations when used at night and during the day. All alternate devices ([id number] and [id number], specifically BiPAP ST 10/5 with 12 backup rate) have been considered and ruled out as volume requirements are not met by BiLevel devices. An NIV with volume-targeted pressure support is necessary to prevent patient from life-threatening harm. Interruption or failure to provide NIV would quickly lead to exacerbation of the patient's condition, hospital re-admission, and likely harm to the patient. Continued use is preferred. Patient is able to protect their airways and clear secretions on their own.  Discharge Instructions  Discharge Instructions     Amb Referral to Palliative Care   Complete by: As directed  Diet - low sodium heart healthy   Complete by: As directed    Increase activity slowly   Complete by: As directed       Allergies as of 05/25/2022       Reactions    Lisinopril Anaphylaxis        Medication List     STOP taking these medications    furosemide 20 MG tablet Commonly known as: LASIX   potassium chloride SA 20 MEQ tablet Commonly known as: KLOR-CON M   spironolactone 25 MG tablet Commonly known as: ALDACTONE       TAKE these medications    albuterol 108 (90 Base) MCG/ACT inhaler Commonly known as: VENTOLIN HFA Inhale 2 puffs into the lungs every 6 (six) hours as needed for wheezing or shortness of breath.   atorvastatin 40 MG tablet Commonly known as: LIPITOR Take 1 tablet (40 mg total) by mouth at bedtime.   carvedilol 3.125 MG tablet Commonly known as: COREG Take 1 tablet (3.125 mg total) by mouth 2 (two) times daily with a meal.   HYDROcodone-acetaminophen 5-325 MG tablet Commonly known as: NORCO/VICODIN Take 1 tablet by mouth every 6 (six) hours as needed for severe pain. Must last 30 days. What changed: Another medication with the same name was removed. Continue taking this medication, and follow the directions you see here.   hydrOXYzine 50 MG tablet Commonly known as: ATARAX Take 1 tablet (50 mg total) by mouth 2 (two) times daily as needed for anxiety.   ipratropium-albuterol 0.5-2.5 (3) MG/3ML Soln Commonly known as: DUONEB Take 3 mLs by nebulization every 6 (six) hours as needed (wheezing).   LORazepam 1 MG tablet Commonly known as: Ativan Take 1 tablet (1 mg total) by mouth every 8 (eight) hours as needed for anxiety.   mometasone-formoterol 100-5 MCG/ACT Aero Commonly known as: DULERA Inhale 2 puffs into the lungs 2 (two) times daily.   naloxone 4 MG/0.1ML Liqd nasal spray kit Commonly known as: NARCAN Place 1 spray into the nose as needed for up to 365 doses (for opioid-induced respiratory depresssion). In case of emergency (overdose), spray once into each nostril. If no response within 3 minutes, repeat application and call [phone].   nicotine 14 mg/24hr patch Commonly known as: NICODERM CQ -  dosed in mg/24 hours Place 1 patch (14 mg total) onto the skin daily. Start taking on: May 26, 2022   pantoprazole 40 MG tablet Commonly known as: PROTONIX Take 40 mg by mouth daily.   predniSONE 5 MG tablet Commonly known as: DELTASONE Take 8 tablets (40 mg total) by mouth daily with breakfast for 7 days, THEN 7 tablets (35 mg total) daily with breakfast for 7 days, THEN 6 tablets (30 mg total) daily with breakfast for 7 days, THEN 5 tablets (25 mg total) daily with breakfast for 7 days, THEN 4 tablets (20 mg total) daily with breakfast for 7 days, THEN 3 tablets (15 mg total) daily with breakfast for 7 days, THEN 2 tablets (10 mg total) daily with breakfast for 7 days, THEN 1 tablet (5 mg total) daily with breakfast for 7 days. Start taking on: May 26, 2022   torsemide 20 MG tablet Commonly known as: DEMADEX Take 1 tablet (20 mg total) by mouth daily. Start taking on: May 26, 2022               Durable Medical Equipment  (From admission, onward)           Start  Ordered   05/25/22 1145  For home use only DME Walker rolling  Once       Question Answer Comment  Walker: With [place]   Patient needs a walker to treat with the following condition Weakness      05/25/22 1144   05/25/22 0937  For home use only DME oxygen  Once       Question Answer Comment  Length of Need Lifetime   Mode or (Route) Nasal cannula   Liters per Minute 3   Frequency Continuous (stationary and portable oxygen unit needed)   Oxygen conserving device Yes   Oxygen delivery system Gas      05/25/22 0936            Allergies  Allergen Reactions   Lisinopril Anaphylaxis    Consultations: Pulmonary   Procedures/Studies: ECHOCARDIOGRAM COMPLETE  Result Date: 05/15/2022    ECHOCARDIOGRAM REPORT   Patient Name:   Promiss Serita Sheller Date of Exam: 05/15/2022 Medical Rec #:  [id number]             Height:       57.0 in Accession #:    [id number]            Weight:        146.6 lb Date of Birth:  Mar 06, 1961             BSA:          1.576 m Patient Age:    61 years              BP:           106/63 mmHg Patient Gender: F                     HR:           83 bpm. Exam Location:  [facility] Procedure: 2D Echo, Color Doppler, Cardiac Doppler and Intracardiac            Opacification Agent Indications:     I50.21 congestive heart failure-Acute Systolic  History:         Patient has prior history of Echocardiogram examinations, most                  recent 04/13/2018. COPD, Signs/Symptoms:Chest Pain and Shortness                  of Breath; Risk Factors:Hypertension.  Sonographer:     Charmayne Sheer Referring Phys:  [place] Diagnosing Phys: Neoma Laming  Sonographer Comments: Suboptimal parasternal window. Image acquisition challenging due to COPD and Image acquisition challenging due to respiratory motion. IMPRESSIONS  1. Left ventricular ejection fraction, by estimation, is 60 to 65%. The left ventricle has normal function. The left ventricle has no regional wall motion abnormalities. There is mild concentric left ventricular hypertrophy. Left ventricular diastolic parameters are consistent with Grade I diastolic dysfunction (impaired relaxation).  2. Right ventricular systolic function is normal. The right ventricular size is normal.  3. The mitral valve is normal in structure. Trivial mitral valve regurgitation. No evidence of mitral stenosis.  4. The aortic valve is calcified. Aortic valve regurgitation is not visualized. Aortic valve sclerosis is present, with no evidence of aortic valve stenosis.  5. The inferior vena cava is normal in size with greater than 50% respiratory variability, suggesting right atrial pressure of 3 mmHg. FINDINGS  Left Ventricle: Left ventricular ejection fraction, by estimation, is 60 to  65%. The left ventricle has normal function. The left ventricle has no regional wall motion abnormalities. Definity contrast agent was given IV to delineate the left  ventricular  endocardial borders. The left ventricular internal cavity size was normal in size. There is mild concentric left ventricular hypertrophy. Left ventricular diastolic parameters are consistent with Grade I diastolic dysfunction (impaired relaxation). Right Ventricle: The right ventricular size is normal. No increase in right ventricular wall thickness. Right ventricular systolic function is normal. Left Atrium: Left atrial size was normal in size. Right Atrium: Right atrial size was normal in size. Pericardium: There is no evidence of pericardial effusion. Mitral Valve: The mitral valve is normal in structure. Trivial mitral valve regurgitation. No evidence of mitral valve stenosis. Tricuspid Valve: The tricuspid valve is normal in structure. Tricuspid valve regurgitation is trivial. No evidence of tricuspid stenosis. Aortic Valve: The aortic valve is calcified. Aortic valve regurgitation is not visualized. Aortic valve sclerosis is present, with no evidence of aortic valve stenosis. Aortic valve mean gradient measures 5.0 mmHg. Aortic valve peak gradient measures 10.5 mmHg. Aortic valve area, by VTI measures 2.61 cm. Pulmonic Valve: The pulmonic valve was normal in structure. Pulmonic valve regurgitation is not visualized. No evidence of pulmonic stenosis. Aorta: The aortic root is normal in size and structure. Venous: The inferior vena cava is normal in size with greater than 50% respiratory variability, suggesting right atrial pressure of 3 mmHg. IAS/Shunts: No atrial level shunt detected by color flow Doppler.  LEFT VENTRICLE PLAX 2D LVIDd:         4.37 cm   Diastology LVIDs:         3.17 cm   LV e' medial:    7.83 cm/s LV PW:         0.90 cm   LV E/e' medial:  10.1 LV IVS:        0.80 cm   LV e' lateral:   11.30 cm/s LVOT diam:     2.10 cm   LV E/e' lateral: 7.0 LV SV:         79 LV SV Index:   50 LVOT Area:     3.46 cm  RIGHT VENTRICLE RV Basal diam:  2.99 cm LEFT ATRIUM             Index LA diam:         2.80 cm 1.78 cm/m LA Vol (A2C):   36.7 ml 23.28 ml/m LA Vol (A4C):   33.0 ml 20.94 ml/m LA Biplane Vol: 36.0 ml 22.84 ml/m  AORTIC VALVE                    PULMONIC VALVE AV Area (Vmax):    2.44 cm     PV Vmax:       1.25 m/s AV Area (Vmean):   2.69 cm     PV Peak grad:  6.2 mmHg AV Area (VTI):     2.61 cm AV Vmax:           162.00 cm/s AV Vmean:          97.600 cm/s AV VTI:            0.303 m AV Peak Grad:      10.5 mmHg AV Mean Grad:      5.0 mmHg LVOT Vmax:         114.00 cm/s LVOT Vmean:        75.900 cm/s LVOT VTI:  0.228 m LVOT/AV VTI ratio: 0.75  AORTA Ao Root diam: 2.80 cm MITRAL VALVE MV Area (PHT): 4.99 cm    SHUNTS MV Decel Time: 152 msec    Systemic VTI:  0.23 m MV E velocity: 78.80 cm/s  Systemic Diam: 2.10 cm MV A velocity: 85.70 cm/s MV E/A ratio:  0.92 Shaukat Khan Electronically signed by Neoma Laming Signature Date/Time: 05/15/2022/12:32:22 PM    Final    DG Chest Portable 1 View  Result Date: 05/14/2022 CLINICAL DATA:  Shortness of breath EXAM: PORTABLE CHEST 1 VIEW COMPARISON:  Radiograph 04/17/2021 FINDINGS: There is a spinal stimulator lead with tip overlying the lower cervical spine. Cardiomediastinal silhouette is unchanged. There is no focal airspace consolidation. There is no pleural effusion. No pneumothorax. There is no acute osseous abnormality. IMPRESSION: No evidence of acute cardiopulmonary disease. Electronically Signed   By: Maurine Simmering M.D.   On: 05/14/2022 19:00      Subjective: Seen and examined on the day of discharge.  Respiratory status back to baseline.  Appropriate for discharge home  Discharge Exam: Vitals:   05/25/22 0726 05/25/22 1135  BP: 125/73 115/73  Pulse: 64 65  Resp: 16 18  Temp: 98.2 F (36.8 C) 98.3 F (36.8 C)  SpO2: 100% 100%   Vitals:   05/25/22 0312 05/25/22 0551 05/25/22 0726 05/25/22 1135  BP: 112/63  125/73 115/73  Pulse: 63  64 65  Resp: _0 Temp: 98 F (36.7 C)  98.2 F (36.8 C) 98.3 F (36.8 C)   TempSrc:      SpO2: 97%  100% 100%  Weight:  62.6 kg      General: Pt is alert, awake, not in acute distress Cardiovascular: RRR, S1/S2 +, no rubs, no gallops Respiratory: Breath sounds.  Scattered crackles.  Mild and expiratory wheeze.  Normal work of breathing.  3 L Abdominal: Soft, NT, ND, bowel sounds + Extremities: no edema, no cyanosis    The results of significant diagnostics from this hospitalization (including imaging, microbiology, ancillary and laboratory) are listed below for reference.     Microbiology: Recent Results (from the past 240 hour(s))  SARS Coronavirus 2 by RT PCR (hospital order, performed in [facility] hospital lab) *cepheid single result test* Anterior Nasal Swab     Status: None   Collection Time: 05/16/22  6:31 PM   Specimen: Anterior Nasal Swab  Result Value Ref Range Status   SARS Coronavirus 2 by RT PCR NEGATIVE NEGATIVE Final    Comment: (NOTE) SARS-CoV-2 target nucleic acids are NOT DETECTED.  The SARS-CoV-2 RNA is generally detectable in upper and lower respiratory specimens during the acute phase of infection. The lowest concentration of SARS-CoV-2 viral copies this assay can detect is 250 copies / mL. A negative result does not preclude SARS-CoV-2 infection and should not be used as the sole basis for treatment or other patient management decisions.  A negative result may occur with improper specimen collection / handling, submission of specimen other than nasopharyngeal swab, presence of viral mutation(s) within the areas targeted by this assay, and inadequate number of viral copies (<250 copies / mL). A negative result must be combined with clinical observations, patient history, and epidemiological information.  Fact Sheet for Patients:   https://www.patel.info/  Fact Sheet for Healthcare Providers: https://hall.com/  This test is not yet approved or  cleared by the [place] FDA  and has been authorized for detection and/or diagnosis of SARS-CoV-2 by FDA under an Emergency Use  Authorization (EUA).  This EUA will remain in effect (meaning this test can be used) for the duration of the COVID-19 declaration under Section 564(b)(1) of the Act, 21 U.S.C. section 360bbb-3(b)(1), unless the authorization is terminated or revoked sooner.  Performed at [facility], [address] [place], [place] [address]      Labs: BNP (last 3 results) Recent Labs    05/14/22 1842  BNP 00.7   Basic Metabolic Panel: Recent Labs  Lab 05/19/22 0415 05/20/22 0437 05/21/22 0650 05/23/22 0750  NA 141 139 139 137  K 3.8 3.9 4.0 3.3*  CL 101 101 100 95*  CO2 _0 GLUCOSE 149* 123* 112* 95  BUN 41* 43* 48* 37*  CREATININE 0.99 0.88 0.86 0.90  CALCIUM 8.9 8.8* 8.9 8.9  MG 2.7* 2.8*  --   --    Liver Function Tests: Recent Labs  Lab 05/21/22 0650  AST 15  ALT 20  ALKPHOS 83  BILITOT 0.6  PROT 7.1  ALBUMIN 3.9   No results for input(s): "LIPASE", "AMYLASE" in the last 168 hours. No results for input(s): "AMMONIA" in the last 168 hours. CBC: Recent Labs  Lab 05/19/22 0415 05/20/22 0437 05/21/22 0650 05/23/22 0750  WBC 6.6 7.5 8.5 10.0  NEUTROABS  --   --   --  5.8  HGB 9.6* 9.5* 9.8* 10.3*  HCT 32.6* 31.8* 32.5* 33.9*  MCV 85.1 81.3 80.4 81.1  PLT 336 326 340 334   Cardiac Enzymes: No results for input(s): "CKTOTAL", "CKMB", "CKMBINDEX", "TROPONINI" in the last 168 hours. BNP: Invalid input(s): "POCBNP" CBG: Recent Labs  Lab 05/24/22 1253 05/24/22 1658 05/24/22 2024 05/25/22 0725 05/25/22 1135  GLUCAP 119* 232* 128* 130* 189*   D-Dimer No results for input(s): "DDIMER" in the last 72 hours. Hgb A1c No results for input(s): "HGBA1C" in the last 72 hours. Lipid Profile No results for input(s): "CHOL", "HDL", "LDLCALC", "TRIG", "CHOLHDL", "LDLDIRECT" in the last 72 hours. Thyroid function studies No results for input(s): "TSH",  "T4TOTAL", "T3FREE", "THYROIDAB" in the last 72 hours.  Invalid input(s): "FREET3" Anemia work up No results for input(s): "VITAMINB12", "FOLATE", "FERRITIN", "TIBC", "IRON", "RETICCTPCT" in the last 72 hours. Urinalysis    Component Value Date/Time   COLORURINE STRAW (A) 05/24/2018 1150   APPEARANCEUR CLEAR (A) 05/24/2018 1150   LABSPEC 1.005 05/24/2018 1150   PHURINE 5.0 05/24/2018 1150   GLUCOSEU NEGATIVE 05/24/2018 1150   HGBUR NEGATIVE 05/24/2018 [place] 05/24/2018 1150   KETONESUR NEGATIVE 05/24/2018 1150   PROTEINUR NEGATIVE 05/24/2018 1150   NITRITE NEGATIVE 05/24/2018 1150   LEUKOCYTESUR TRACE (A) 05/24/2018 1150   Sepsis Labs Recent Labs  Lab 05/19/22 0415 05/20/22 0437 05/21/22 0650 05/23/22 0750  WBC 6.6 7.5 8.5 10.0   Microbiology Recent Results (from the past 240 hour(s))  SARS Coronavirus 2 by RT PCR (hospital order, performed in [place] hospital lab) *cepheid single result test* Anterior Nasal Swab     Status: None   Collection Time: 05/16/22  6:31 PM   Specimen: Anterior Nasal Swab  Result Value Ref Range Status   SARS Coronavirus 2 by RT PCR NEGATIVE NEGATIVE Final    Comment: (NOTE) SARS-CoV-2 target nucleic acids are NOT DETECTED.  The SARS-CoV-2 RNA is generally detectable in upper and lower respiratory specimens during the acute phase of infection. The lowest concentration of SARS-CoV-2 viral copies this assay can detect is 250 copies / mL. A negative result does not preclude SARS-CoV-2  infection and should not be used as the sole basis for treatment or other patient management decisions.  A negative result may occur with improper specimen collection / handling, submission of specimen other than nasopharyngeal swab, presence of viral mutation(s) within the areas targeted by this assay, and inadequate number of viral copies (<250 copies / mL). A negative result must be combined with clinical observations, patient history,  and epidemiological information.  Fact Sheet for Patients:   https://www.patel.info/  Fact Sheet for Healthcare Providers: https://hall.com/  This test is not yet approved or  cleared by the [place] FDA and has been authorized for detection and/or diagnosis of SARS-CoV-2 by FDA under an Emergency Use Authorization (EUA).  This EUA will remain in effect (meaning this test can be used) for the duration of the COVID-19 declaration under Section 564(b)(1) of the Act, 21 U.S.C. section 360bbb-3(b)(1), unless the authorization is terminated or revoked sooner.  Performed at [facility], [address], [place], [place] [address]      Time coordinating discharge: Over 30 minutes  SIGNED:   Sidney Ace, MD  [facility] 05/25/2022, 1:07 PM Pager   If 7PM-7AM, please contact night-coverage

## 2022-05-25 NOTE — TOC Transition Note (Addendum)
Transition of Care [facility]) - CM/SW Discharge Note   Patient Details  Name: Elizabeth [name] MRN: [id number] Date of Birth: 12-Jul-1961  [facility]) CM/SW Contact:  Margarito Liner, LCSW Phone Number: 05/25/2022, 1:11 PM   Clinical Narrative:  Patient has orders to discharge home today. No further concerns. CSW signing off.   2:35 pm: Patient left before [employer] [facility] could arrive to set up NIV in the hospital. RT went to the home and called patient. Patient told her she would be home around 7:00 tonight. RT spoke with her again and patient prefers to have it set up tomorrow. Attending and pulmonology are aware.  Final next level of care: Home/Self Care Barriers to Discharge: Barriers Resolved   Patient Goals and CMS Choice        Discharge Placement                Patient to be transferred to facility by: Son   Patient and family notified of of transfer: 05/25/22  Discharge Plan and Services     Post Acute Care Choice: NA          DME Arranged: Oxygen, NIV, RW DME Agency: [employer] Date DME Agency Contacted: 05/25/22   Representative spoke with at DME Agency: Oletha Cruel, Bjorn Loser            Social Determinants of Health (SDOH) Interventions     Readmission Risk Interventions    05/16/2022   11:11 AM 04/22/2021   10:21 AM  Readmission Risk Prevention Plan  Transportation Screening Complete Complete  PCP or Specialist Appt within 3-5 Days Complete Complete  HRI or Home Care Consult  Complete  Social Work Consult for Recovery Care Planning/Counseling Complete Complete  Palliative Care Screening Not Applicable Not Applicable  Medication Review Oceanographer) Complete Referral to Pharmacy

## 2022-05-25 NOTE — TOC Progression Note (Addendum)
Transition of Care [facility]) - Progression Note    Patient Details  Name: Elizabeth [name] MRN: [id number] Date of Birth: Nov 27, 1960  Transition of Care [facility]) CM/SW Contact  Margarito Liner, LCSW Phone Number: 05/25/2022, 9:52 AM  Clinical Narrative:   [facility] approved for NIV. [employer] will deliver it to the home later today. Ordered home oxygen through [employer].  11:46 am: Patient declined home health. She will notify her PCP if she changes her mind later on. Ordered RW through [employer]. Her son will pick her up today.  Expected Discharge Plan: Home/Self Care Barriers to Discharge: Continued Medical Work up  Expected Discharge Plan and Services Expected Discharge Plan: Home/Self Care     Post Acute Care Choice: NA Living arrangements for the past 2 months: Single Family Home                                       Social Determinants of Health (SDOH) Interventions    Readmission Risk Interventions    05/16/2022   11:11 AM 04/22/2021   10:21 AM  Readmission Risk Prevention Plan  Transportation Screening Complete Complete  PCP or Specialist Appt within 3-5 Days Complete Complete  HRI or Home Care Consult  Complete  Social Work Consult for Recovery Care Planning/Counseling Complete Complete  Palliative Care Screening Not Applicable Not Applicable  Medication Review Oceanographer) Complete Referral to Pharmacy

## 2022-05-25 NOTE — Progress Notes (Signed)
Patient given discharge instructions and verbalizes understanding. Home o2 tanks delivered and with patient. Discharged via WC. Patient with no questions.

## 2022-05-25 NOTE — Progress Notes (Signed)
Physical Therapy Treatment Patient Details Name: Elizabeth [name] MRN: [id number] DOB: Aug 23, 1961 Today's Date: 05/25/2022   History of Present Illness Elizabeth [name] is a 61 y.o. female seen in ed with complaints of sob since 3 days. The pt is prescribed O2 at night PRN, but has been using continuously d/t SOB. Pt reports worsening LE edema. She has a hx of CHF, COPD, RSD, HTN, lisinopril allergy, and vitamin B-12 and D deficiency.    PT Comments    Patient alert, on 3L throughout session, did report 5/10 back pain (chronic). Pt exhibited significant improvement in mobility and activity tolerance today. She was able to perform bed mobility modI. Sit <> Stand with RW several times, supervision with cues for hand placement. She ambulated several bouts with supervision, >244ft. She also performed stair navigation with education and CGA. Recommendation updated to HHPT due to pt progress to maximize function, mobility, and safety.     Recommendations for follow up therapy are one component of a multi-disciplinary discharge planning process, led by the attending physician.  Recommendations may be updated based on patient status, additional functional criteria and insurance authorization.  Follow Up Recommendations  Home health PT Can patient physically be transported by private vehicle: Yes   Assistance Recommended at Discharge Intermittent Supervision/Assistance  Patient can return home with the following A little help with walking and/or transfers;A little help with bathing/dressing/bathroom;Assistance with cooking/housework;Assist for transportation;Help with stairs or ramp for entrance   Equipment Recommendations  Rolling walker (2 wheels)    Recommendations for Other Services       Precautions / Restrictions Precautions Precautions: Fall Restrictions Weight Bearing Restrictions: No     Mobility  Bed Mobility Overal bed mobility: Modified Independent                   Transfers Overall transfer level: Needs assistance Equipment used: Rolling walker (2 wheels) Transfers: Sit to/from Stand Sit to Stand: Supervision                Ambulation/Gait Ambulation/Gait assistance: Supervision Gait Distance (Feet):  (78ft, 49ft, 126ft) Assistive device: Rolling walker (2 wheels)         General Gait Details: cued for RW management and hand placement   Stairs Stairs: Yes Stairs assistance: Supervision Stair Management: Two rails, Step to pattern, Alternating pattern Number of Stairs: 4 General stair comments: CGA   Wheelchair Mobility    Modified Rankin (Stroke Patients Only)       Balance Overall balance assessment: Needs assistance Sitting-balance support: Feet supported, No upper extremity supported Sitting balance-Leahy Scale: Fair     Standing balance support: Single extremity supported, During functional activity Standing balance-Leahy Scale: Fair                              Cognition Arousal/Alertness: Awake/alert Behavior During Therapy: WFL for tasks assessed/performed Overall Cognitive Status: Within Functional Limits for tasks assessed                                          Exercises      General Comments        Pertinent Vitals/Pain Pain Assessment Pain Assessment: No/denies pain    Home Living  Prior Function            PT Goals (current goals can now be found in the care plan section) Progress towards PT goals: Progressing toward goals    Frequency    Min 2X/week      PT Plan Discharge plan needs to be updated    Co-evaluation              AM-PAC PT "6 Clicks" Mobility   Outcome Measure  Help needed turning from your back to your side while in a flat bed without using bedrails?: None Help needed moving from lying on your back to sitting on the side of a flat bed without using bedrails?: None Help needed  moving to and from a bed to a chair (including a wheelchair)?: None Help needed standing up from a chair using your arms (e.g., wheelchair or bedside chair)?: None Help needed to walk in hospital room?: A Little Help needed climbing 3-5 steps with a railing? : A Little 6 Click Score: 22    End of Session Equipment Utilized During Treatment: Gait belt;Oxygen (3L) Activity Tolerance: Patient tolerated treatment well Patient left: in chair;with chair alarm set;with call bell/phone within reach Nurse Communication: Mobility status PT Visit Diagnosis: Unsteadiness on feet (R26.81);Muscle weakness (generalized) (M62.81);Difficulty in walking, not elsewhere classified (R26.2)     Time: 7654-6503 PT Time Calculation (min) (ACUTE ONLY): 27 min  Charges:  $Gait Training: 8-22 mins $Therapeutic Activity: 8-22 mins                     Olga Coaster PT, DPT 10:37 AM,05/25/22

## 2022-05-25 NOTE — Progress Notes (Signed)
[facility] Civil engineer, contracting [facility]) Hospital Liaison note:  Notified via Epic workque from Dr. Lolita Patella of request for [facility] : [facility] Palliative Care services. Will continue to follow for disposition.  Please call with any outpatient palliative questions or concerns.  Thank you for the opportunity to participate in this patient's care.  Thank you, Abran Cantor, LPN [facility] Liaison [phone]

## 2022-05-27 ENCOUNTER — Telehealth: Payer: Self-pay | Admitting: Primary Care

## 2022-05-27 NOTE — Telephone Encounter (Signed)
Attempted to contact patient to offer to schedule a Palliative Consult, no answer unable to leave a message due to mailbox was full.  I then sent patient a MyChart message requesting a call to schedule Palliative Consult.

## 2022-06-02 ENCOUNTER — Telehealth: Payer: Self-pay | Admitting: Primary Care

## 2022-06-03 ENCOUNTER — Telehealth: Payer: Self-pay | Admitting: Pain Medicine

## 2022-06-03 NOTE — Telephone Encounter (Signed)
Says her meds need PA

## 2022-06-08 ENCOUNTER — Telehealth: Payer: Self-pay | Admitting: Primary Care

## 2022-06-08 NOTE — Telephone Encounter (Signed)
Attempted to contact patient to schedule Palliative Consult, no answer and unable to leave msg due to mailbox was full.  Also sent patient an email requesting her to call me back by Wed. 06/10/22 to let me know whether she wanted to pursue Palliative services or not and also let her know that I could answer any questions that she may have regarding this

## 2022-06-08 NOTE — Telephone Encounter (Signed)
Attempted to contact patient to schedule Palliative Consult, no answer and unable to leave msg due to mailbox was full.

## 2022-06-17 ENCOUNTER — Telehealth: Payer: Self-pay

## 2022-06-17 NOTE — Telephone Encounter (Signed)
She said when she went to pick up her pills they would only give her 30. She doesn't know if we changed the script or if it was because of the prior authorization. States she still needs the prior auth done.

## 2022-06-17 NOTE — Telephone Encounter (Signed)
That prewscription was from a doctor from the hospital per patient.  Will need to make an appointment with Dr Laban Emperor for med refill.

## 2022-06-23 NOTE — Progress Notes (Unsigned)
PROVIDER NOTE: Information contained herein reflects review and annotations entered in association with encounter. Interpretation of such information and data should be left to medically-trained personnel. Information provided to patient can be located elsewhere in the medical record under "Patient Instructions". Document created using STT-dictation technology, any transcriptional errors that may result from process are unintentional.    Patient: Elizabeth [name]  Service Category: E/M  Provider: Gaspar Cola, MD  DOB: 09-06-1961  DOS: 06/24/2022  Referring Provider: Bunnie Pion, FNP  MRN: [id number]  Specialty: Interventional Pain Management  PCP: Bunnie Pion, FNP  Type: Established Patient  Setting: Ambulatory outpatient    Location: Office  Delivery: Face-to-face     HPI  Elizabeth [name], a 61 y.o. year old female, is here today because of her No primary diagnosis found.. Elizabeth [name]'s primary complain today is No chief complaint on file. Last encounter: My last encounter with her was on 06/03/2022. Pertinent problems: Elizabeth [name] has Chronic low back pain (Bilateral) w/o sciatica; Chronic pain syndrome; Presence of spinal cord stimulator; Chronic lower extremity pain (1ry area of Pain) (Bilateral); Lumbar facet syndrome (Bilateral); Lumbar spondylosis; Complex regional pain syndrome type 1 of upper extremities (Bilateral); and Complex regional pain syndrome type 1 of lower extremities (Bilateral) on their pertinent problem list. Pain Assessment: Severity of   is reported as a  /10. Location:    / . Onset:  . Quality:  . Timing:  . Modifying factor(s):  Marland Kitchen Vitals:  vitals were not taken for this visit.   Reason for encounter: medication management. ***  Pharmacotherapy Assessment  Analgesic: Oxycodone IR 5 mg, 1 tab PO q 6 hrs (20 mg/day of oxycodone) MME/day: 30 mg/day.   Monitoring: [place] PMP: PDMP reviewed during this encounter.       Pharmacotherapy: No  side-effects or adverse reactions reported. Compliance: No problems identified. Effectiveness: Clinically acceptable.  No notes on file  No results found for: "CBDTHCR" No results found for: "D8THCCBX" No results found for: "D9THCCBX"  UDS:  Summary  Date Value Ref Range Status  03/11/2022 Note  Final    Comment:    ==================================================================== [employer] Select 13 (MW) ==================================================================== Test                             Result       Flag       Units  Drug Present and Declared for Prescription Verification   Hydrocodone                    3003         EXPECTED   ng/mg creat   Hydromorphone                  792          EXPECTED   ng/mg creat   Dihydrocodeine                 649          EXPECTED   ng/mg creat   Norhydrocodone                 5814         EXPECTED   ng/mg creat    Sources of hydrocodone include scheduled prescription medications.    Hydromorphone, dihydrocodeine and norhydrocodone are expected    metabolites of hydrocodone. Hydromorphone and dihydrocodeine are    also available as scheduled  prescription medications.  Drug Present not Declared for Prescription Verification   Amphetamine                    300          UNEXPECTED ng/mg creat    Amphetamine is available as a schedule II prescription drug.    Oxazepam                       247          UNEXPECTED ng/mg creat    Oxazepam may be administered as a scheduled prescription medication;    it is also an expected metabolite of other benzodiazepine drugs,    including diazepam, chlordiazepoxide, prazepam, clorazepate,    halazepam, and temazepam.  ==================================================================== Test                      Result    Flag   Units      Ref Range   Creatinine              78               mg/dL      >=20 ==================================================================== Declared  Medications:  The flagging and interpretation on this report are based on the  following declared medications.  Unexpected results may arise from  inaccuracies in the declared medications.   **Note: The testing scope of this panel includes these medications:   Hydrocodone (Norco)   **Note: The testing scope of this panel does not include the  following reported medications:   Acetaminophen (Norco)  Albuterol (Ventolin HFA)  Albuterol (Duoneb)  Atorvastatin (Lipitor)  Carvedilol (Coreg)  Formoterol (Dulera)  Furosemide (Lasix)  Hydroxyzine (Atarax)  Ipratropium (Duoneb)  Mometasone (Dulera)  Naloxone (Narcan)  Potassium (Klor-Con)  Spironolactone (Aldactone) ==================================================================== For clinical consultation, please call [phone]. ====================================================================       ROS  Constitutional: Denies any fever or chills Gastrointestinal: No reported hemesis, hematochezia, vomiting, or acute GI distress Musculoskeletal: Denies any acute onset joint swelling, redness, loss of ROM, or weakness Neurological: No reported episodes of acute onset apraxia, aphasia, dysarthria, agnosia, amnesia, paralysis, loss of coordination, or loss of consciousness  Medication Review  HYDROcodone-acetaminophen, LORazepam, albuterol, atorvastatin, carvedilol, hydrOXYzine, ipratropium-albuterol, mometasone-formoterol, naloxone, nicotine, pantoprazole, predniSONE, and torsemide  History Review  Allergy: Elizabeth [name] is allergic to lisinopril. Drug: Elizabeth [name]  reports no history of drug use. Alcohol:  reports no history of alcohol use. Tobacco:  reports that she quit smoking about 4 years ago. Her smoking use included cigarettes. She has a 60.00 pack-year smoking history. She has never used smokeless tobacco. Social: Elizabeth [name]  reports that she quit smoking about 4 years ago. Her smoking use included cigarettes.  She has a 60.00 pack-year smoking history. She has never used smokeless tobacco. She reports that she does not drink alcohol and does not use drugs. Medical:  has a past medical history of Anxiety, Arm pain (07/25/2015), CHF (congestive heart failure) ([place]), Congestive heart failure ([place]) (07/25/2015), COPD (chronic obstructive pulmonary disease) ([place]), Depression, Hypertension, Lower extremity pain (07/25/2015), Reflux, RSD (reflex sympathetic dystrophy), Spinal cord stimulator status, Vitamin B12 deficiency, and Vitamin D deficiency. Surgical: Ms. Prete  has a past surgical history that includes Spinal cord stimulator implant. Family: family history includes Cancer in her father; Cirrhosis in her mother.  Laboratory Chemistry Profile   Renal Lab Results  Component Value Date   BUN 37 (H)  05/23/2022   CREATININE 0.90 05/23/2022   BCR 17 08/30/2017   GFRAA >60 08/18/2018   GFRNONAA >60 05/23/2022    Hepatic Lab Results  Component Value Date   AST 15 05/21/2022   ALT 20 05/21/2022   ALBUMIN 3.9 05/21/2022   ALKPHOS 83 05/21/2022    Electrolytes Lab Results  Component Value Date   NA 137 05/23/2022   K 3.3 (L) 05/23/2022   CL 95 (L) 05/23/2022   CALCIUM 8.9 05/23/2022   MG 2.8 (H) 05/20/2022   PHOS 3.4 04/20/2021    Bone Lab Results  Component Value Date   VD25OH 25.3 (L) 03/18/2016   VD125OH2TOT 76.2 03/18/2016   25OHVITD1 37 08/30/2017   25OHVITD2 1.0 08/30/2017   25OHVITD3 36 08/30/2017    Inflammation (CRP: Acute Phase) (ESR: Chronic Phase) Lab Results  Component Value Date   CRP 4.4 08/30/2017   ESRSEDRATE 30 08/30/2017         Note: Above Lab results reviewed.  Recent Imaging Review  ECHOCARDIOGRAM COMPLETE    ECHOCARDIOGRAM REPORT       Patient Name:   Kyesha Serita Sheller Date of Exam: 05/15/2022 Medical Rec #:  [id number]             Height:       57.0 in Accession #:    [id number]            Weight:       146.6 lb Date of Birth:  30-Dec-1960              BSA:          1.576 m Patient Age:    10 years              BP:           106/63 mmHg Patient Gender: F                     HR:           83 bpm. Exam Location:  [facility]  Procedure: 2D Echo, Color Doppler, Cardiac Doppler and Intracardiac            Opacification Agent  Indications:     I50.21 congestive heart failure-Acute Systolic   History:         Patient has prior history of Echocardiogram examinations, most                  recent 04/13/2018. COPD, Signs/Symptoms:Chest Pain and Shortness                  of Breath; Risk Factors:Hypertension.   Sonographer:     Charmayne Sheer Referring Phys:  [place] Diagnosing Phys: Neoma Laming    Sonographer Comments: Suboptimal parasternal window. Image acquisition challenging due to COPD and Image acquisition challenging due to respiratory motion. IMPRESSIONS   1. Left ventricular ejection fraction, by estimation, is 60 to 65%. The left ventricle has normal function. The left ventricle has no regional wall motion abnormalities. There is mild concentric left ventricular hypertrophy. Left ventricular diastolic  parameters are consistent with Grade I diastolic dysfunction (impaired relaxation).  2. Right ventricular systolic function is normal. The right ventricular size is normal.  3. The mitral valve is normal in structure. Trivial mitral valve regurgitation. No evidence of mitral stenosis.  4. The aortic valve is calcified. Aortic valve regurgitation is not visualized. Aortic valve sclerosis is present, with no evidence of aortic valve stenosis.  5. The inferior vena cava is normal in size with greater than 50% respiratory variability, suggesting right atrial pressure of 3 mmHg.  FINDINGS  Left Ventricle: Left ventricular ejection fraction, by estimation, is 60 to 65%. The left ventricle has normal function. The left ventricle has no regional wall motion abnormalities. Definity contrast agent was given IV to delineate the left  ventricular  endocardial borders. The left ventricular internal cavity size was normal in size. There is mild concentric left ventricular hypertrophy. Left ventricular diastolic parameters are consistent with Grade I diastolic dysfunction (impaired relaxation).  Right Ventricle: The right ventricular size is normal. No increase in right ventricular wall thickness. Right ventricular systolic function is normal.  Left Atrium: Left atrial size was normal in size.  Right Atrium: Right atrial size was normal in size.  Pericardium: There is no evidence of pericardial effusion.  Mitral Valve: The mitral valve is normal in structure. Trivial mitral valve regurgitation. No evidence of mitral valve stenosis.  Tricuspid Valve: The tricuspid valve is normal in structure. Tricuspid valve regurgitation is trivial. No evidence of tricuspid stenosis.  Aortic Valve: The aortic valve is calcified. Aortic valve regurgitation is not visualized. Aortic valve sclerosis is present, with no evidence of aortic valve stenosis. Aortic valve mean gradient measures 5.0 mmHg. Aortic valve peak gradient measures  10.5 mmHg. Aortic valve area, by VTI measures 2.61 cm.  Pulmonic Valve: The pulmonic valve was normal in structure. Pulmonic valve regurgitation is not visualized. No evidence of pulmonic stenosis.  Aorta: The aortic root is normal in size and structure.  Venous: The inferior vena cava is normal in size with greater than 50% respiratory variability, suggesting right atrial pressure of 3 mmHg.  IAS/Shunts: No atrial level shunt detected by color flow Doppler.    LEFT VENTRICLE PLAX 2D LVIDd:         4.37 cm   Diastology LVIDs:         3.17 cm   LV e' medial:    7.83 cm/s LV PW:         0.90 cm   LV E/e' medial:  10.1 LV IVS:        0.80 cm   LV e' lateral:   11.30 cm/s LVOT diam:     2.10 cm   LV E/e' lateral: 7.0 LV SV:         79 LV SV Index:   50 LVOT Area:     3.46 cm    RIGHT VENTRICLE RV  Basal diam:  2.99 cm  LEFT ATRIUM             Index LA diam:        2.80 cm 1.78 cm/m LA Vol (A2C):   36.7 ml 23.28 ml/m LA Vol (A4C):   33.0 ml 20.94 ml/m LA Biplane Vol: 36.0 ml 22.84 ml/m  AORTIC VALVE                    PULMONIC VALVE AV Area (Vmax):    2.44 cm     PV Vmax:       1.25 m/s AV Area (Vmean):   2.69 cm     PV Peak grad:  6.2 mmHg AV Area (VTI):     2.61 cm AV Vmax:           162.00 cm/s AV Vmean:          97.600 cm/s AV VTI:  0.303 m AV Peak Grad:      10.5 mmHg AV Mean Grad:      5.0 mmHg LVOT Vmax:         114.00 cm/s LVOT Vmean:        75.900 cm/s LVOT VTI:          0.228 m LVOT/AV VTI ratio: 0.75   AORTA Ao Root diam: 2.80 cm  MITRAL VALVE MV Area (PHT): 4.99 cm    SHUNTS MV Decel Time: 152 msec    Systemic VTI:  0.23 m MV E velocity: 78.80 cm/s  Systemic Diam: 2.10 cm MV A velocity: 85.70 cm/s MV E/A ratio:  0.92  Shaukat Khan Electronically signed by Neoma Laming Signature Date/Time: 05/15/2022/12:32:22 PM      Final   Note: Reviewed        Physical Exam  General appearance: Well nourished, well developed, and well hydrated. In no apparent acute distress Mental status: Alert, oriented x 3 (person, place, & time)       Respiratory: No evidence of acute respiratory distress Eyes: PERLA Vitals: There were no vitals taken for this visit. BMI: Estimated body mass index is 29.86 kg/m as calculated from the following:   Height as of 03/11/22: $RemoveBef'4\' 9"'ipgmqCtlvB$  (1.448 m).   Weight as of 05/25/22: 138 lb 0.1 oz (62.6 kg). Ideal: Patient weight not recorded  Assessment   Diagnosis Status  No diagnosis found. Controlled Controlled Controlled   Updated Problems: No problems updated.  Plan of Care  Problem-specific:  No problem-specific Assessment & Plan notes found for this encounter.  Ms. Willy Vorce Swiss has a current medication list which includes the following long-term medication(s): albuterol, atorvastatin, carvedilol,  hydrocodone-acetaminophen, ipratropium-albuterol, mometasone-formoterol, and torsemide.  Pharmacotherapy (Medications Ordered): No orders of the defined types were placed in this encounter.  Orders:  No orders of the defined types were placed in this encounter.  Follow-up plan:   No follow-ups on file.     Interventional Therapies  Risk  Complexity Considerations:   Estimated body mass index is 28.13 kg/m as calculated from the following:   Height as of this encounter: $RemoveBeforeD'4\' 9"'aogSmTpEDNMVsT$  (1.448 m).   Weight as of this encounter: 130 lb (59 kg). WNL   Planned  Pending:      Under consideration:   Diagnostic bilateral lumbar facet MBB  Possible bilateral lumbar sympathetic RFA    Completed:   Therapeutic right stellate ganglion Blk (SGB) (Multiple)  Therapeutic left stellate ganglion Blk (SGB) (Multiple)  Therapeutic right lumbar sympathetic Blk (LSB) (Multiple)  Hospital admission for therapeutic cervical epidural local anesthetic infusion (Multiple)  Hospital admission for therapeutic Lumbar epidural local anesthetic infusion (Multiple)    Therapeutic  Palliative (PRN) options:   Palliative SGB  Palliative  LSB      Recent Visits No visits were found meeting these conditions. Showing recent visits within past 90 days and meeting all other requirements Future Appointments Date Type Provider Dept  06/24/22 Appointment Milinda Pointer, MD [facility]  Showing future appointments within next 90 days and meeting all other requirements  I discussed the assessment and treatment plan with the patient. The patient was provided an opportunity to ask questions and all were answered. The patient agreed with the plan and demonstrated an understanding of the instructions.  Patient advised to call back or seek an in-person evaluation if the symptoms or condition worsens.  Duration of encounter: *** minutes.  Total time on encounter, as per AMA guidelines included both  the  face-to-face and non-face-to-face time personally spent by the physician and/or other qualified health care professional(s) on the day of the encounter (includes time in activities that require the physician or other qualified health care professional and does not include time in activities normally performed by clinical staff). Physician's time may include the following activities when performed: preparing to see the patient (eg, review of tests, pre-charting review of records) obtaining and/or reviewing separately obtained history performing a medically appropriate examination and/or evaluation counseling and educating the patient/family/caregiver ordering medications, tests, or procedures referring and communicating with other health care professionals (when not separately reported) documenting clinical information in the electronic or other health record independently interpreting results (not separately reported) and communicating results to the patient/ family/caregiver care coordination (not separately reported)  Note by: Gaspar Cola, MD Date: 06/24/2022; Time: 4:44 PM

## 2022-06-24 ENCOUNTER — Other Ambulatory Visit: Payer: Self-pay

## 2022-06-24 ENCOUNTER — Encounter: Payer: Self-pay | Admitting: Pain Medicine

## 2022-06-24 ENCOUNTER — Ambulatory Visit: Payer: Medicaid Other | Attending: Pain Medicine | Admitting: Pain Medicine

## 2022-06-24 VITALS — BP 126/73 | HR 89 | Temp 97.0°F | Resp 16 | Ht <= 58 in | Wt 128.0 lb

## 2022-06-24 DIAGNOSIS — Z969 Presence of functional implant, unspecified: Secondary | ICD-10-CM | POA: Insufficient documentation

## 2022-06-24 DIAGNOSIS — M79604 Pain in right leg: Secondary | ICD-10-CM | POA: Insufficient documentation

## 2022-06-24 DIAGNOSIS — Z79899 Other long term (current) drug therapy: Secondary | ICD-10-CM | POA: Diagnosis present

## 2022-06-24 DIAGNOSIS — G90523 Complex regional pain syndrome I of lower limb, bilateral: Secondary | ICD-10-CM | POA: Diagnosis present

## 2022-06-24 DIAGNOSIS — G894 Chronic pain syndrome: Secondary | ICD-10-CM | POA: Insufficient documentation

## 2022-06-24 DIAGNOSIS — G90513 Complex regional pain syndrome I of upper limb, bilateral: Secondary | ICD-10-CM | POA: Insufficient documentation

## 2022-06-24 DIAGNOSIS — G8929 Other chronic pain: Secondary | ICD-10-CM | POA: Insufficient documentation

## 2022-06-24 DIAGNOSIS — M79605 Pain in left leg: Secondary | ICD-10-CM | POA: Diagnosis present

## 2022-06-24 DIAGNOSIS — Z79891 Long term (current) use of opiate analgesic: Secondary | ICD-10-CM | POA: Diagnosis present

## 2022-06-24 MED ORDER — HYDROCODONE-ACETAMINOPHEN 5-325 MG PO TABS: 1.0000 | ORAL_TABLET | Freq: Four times a day (QID) | ORAL | 0 refills | Status: DC | PRN

## 2022-06-24 NOTE — Progress Notes (Signed)
Nursing Pain Medication Assessment:  Safety precautions to be maintained throughout the outpatient stay will include: orient to surroundings, keep bed in low position, maintain call bell within reach at all times, provide assistance with transfer out of bed and ambulation.  Medication Inspection Compliance: Pill count conducted under aseptic conditions, in front of the patient. Neither the pills nor the bottle was removed from the patient's sight at any time. Once count was completed pills were immediately returned to the patient in their original bottle.  Medication: Hydrocodone/APAP Pill/Patch Count:  0 of 30 pills remain Pill/Patch Appearance: Markings consistent with prescribed medication Bottle Appearance: Standard pharmacy container. Clearly labeled. Filled Date: 09 / 02 / 2023 Last Medication intake:  Day before yesterday

## 2022-06-24 NOTE — Progress Notes (Signed)
Patient was admitted to the hospital on 05/14/22 d/t respiratory failure and exacerbation of CHF.  She was in for 19 days.  She was given hydro- apap 5-325 mg by Dr Georgeann Oppenheim.  She did not have her last return appt from last visit here at pain clinic and ran out of medicine and then was hospitalized.

## 2022-06-24 NOTE — Patient Instructions (Signed)

## 2022-07-06 ENCOUNTER — Encounter: Payer: Medicaid Other | Admitting: Pain Medicine

## 2022-07-29 ENCOUNTER — Emergency Department: Payer: Medicaid Other

## 2022-07-29 ENCOUNTER — Inpatient Hospital Stay
Admission: EM | Admit: 2022-07-29 | Discharge: 2022-08-02 | DRG: 191 | Disposition: A | Discharge status: Home / Self Care | Payer: Medicaid Other | Attending: Internal Medicine | Admitting: Internal Medicine

## 2022-07-29 ENCOUNTER — Other Ambulatory Visit: Payer: Self-pay

## 2022-07-29 ENCOUNTER — Inpatient Hospital Stay: Payer: Medicaid Other

## 2022-07-29 DIAGNOSIS — K219 Gastro-esophageal reflux disease without esophagitis: Secondary | ICD-10-CM | POA: Diagnosis present

## 2022-07-29 DIAGNOSIS — Z888 Allergy status to other drugs, medicaments and biological substances status: Secondary | ICD-10-CM | POA: Diagnosis not present

## 2022-07-29 DIAGNOSIS — J441 Chronic obstructive pulmonary disease with (acute) exacerbation: Principal | ICD-10-CM

## 2022-07-29 DIAGNOSIS — G894 Chronic pain syndrome: Secondary | ICD-10-CM | POA: Diagnosis present

## 2022-07-29 DIAGNOSIS — E785 Hyperlipidemia, unspecified: Secondary | ICD-10-CM | POA: Diagnosis present

## 2022-07-29 DIAGNOSIS — Z79899 Other long term (current) drug therapy: Secondary | ICD-10-CM

## 2022-07-29 DIAGNOSIS — R600 Localized edema: Secondary | ICD-10-CM | POA: Insufficient documentation

## 2022-07-29 DIAGNOSIS — D649 Anemia, unspecified: Secondary | ICD-10-CM | POA: Diagnosis present

## 2022-07-29 DIAGNOSIS — F419 Anxiety disorder, unspecified: Secondary | ICD-10-CM | POA: Diagnosis present

## 2022-07-29 DIAGNOSIS — T40605A Adverse effect of unspecified narcotics, initial encounter: Secondary | ICD-10-CM | POA: Diagnosis present

## 2022-07-29 DIAGNOSIS — Z1152 Encounter for screening for COVID-19: Secondary | ICD-10-CM

## 2022-07-29 DIAGNOSIS — I5032 Chronic diastolic (congestive) heart failure: Secondary | ICD-10-CM | POA: Diagnosis present

## 2022-07-29 DIAGNOSIS — Z87891 Personal history of nicotine dependence: Secondary | ICD-10-CM

## 2022-07-29 DIAGNOSIS — Z7951 Long term (current) use of inhaled steroids: Secondary | ICD-10-CM | POA: Diagnosis not present

## 2022-07-29 DIAGNOSIS — Z6833 Body mass index (BMI) 33.0-33.9, adult: Secondary | ICD-10-CM | POA: Diagnosis not present

## 2022-07-29 DIAGNOSIS — K5900 Constipation, unspecified: Secondary | ICD-10-CM

## 2022-07-29 DIAGNOSIS — E669 Obesity, unspecified: Secondary | ICD-10-CM | POA: Diagnosis present

## 2022-07-29 DIAGNOSIS — I110 Hypertensive heart disease with heart failure: Secondary | ICD-10-CM | POA: Diagnosis present

## 2022-07-29 DIAGNOSIS — M47816 Spondylosis without myelopathy or radiculopathy, lumbar region: Secondary | ICD-10-CM | POA: Diagnosis present

## 2022-07-29 DIAGNOSIS — I10 Essential (primary) hypertension: Secondary | ICD-10-CM | POA: Diagnosis not present

## 2022-07-29 DIAGNOSIS — K5903 Drug induced constipation: Secondary | ICD-10-CM | POA: Diagnosis present

## 2022-07-29 LAB — RESP PANEL BY RT-PCR (FLU A&B, COVID) ARPGX2
Influenza A by PCR: NEGATIVE
Influenza B by PCR: NEGATIVE
SARS Coronavirus 2 by RT PCR: NEGATIVE

## 2022-07-29 LAB — CBC
HCT: 28 % — ABNORMAL LOW (ref 36.0–46.0)
Hemoglobin: 7.8 g/dL — ABNORMAL LOW (ref 12.0–15.0)
MCH: 23.5 pg — ABNORMAL LOW (ref 26.0–34.0)
MCHC: 27.9 g/dL — ABNORMAL LOW (ref 30.0–36.0)
MCV: 84.3 fL (ref 80.0–100.0)
Platelets: 369 10*3/uL (ref 150–400)
RBC: 3.32 MIL/uL — ABNORMAL LOW (ref 3.87–5.11)
RDW: 17.5 % — ABNORMAL HIGH (ref 11.5–15.5)
WBC: 6.4 10*3/uL (ref 4.0–10.5)
nRBC: 0 % (ref 0.0–0.2)

## 2022-07-29 LAB — BASIC METABOLIC PANEL
Anion gap: 10 (ref 5–15)
BUN: 24 mg/dL — ABNORMAL HIGH (ref 8–23)
CO2: 23 mmol/L (ref 22–32)
Calcium: 9.2 mg/dL (ref 8.9–10.3)
Chloride: 106 mmol/L (ref 98–111)
Creatinine, Ser: 0.82 mg/dL (ref 0.44–1.00)
GFR, Estimated: >60 mL/min (ref >60)
Glucose, Bld: 131 mg/dL — ABNORMAL HIGH (ref 70–99)
Potassium: 4.5 mmol/L (ref 3.5–5.1)
Sodium: 139 mmol/L (ref 135–145)

## 2022-07-29 LAB — TROPONIN I (HIGH SENSITIVITY): Troponin I (High Sensitivity): 4 ng/L (ref <18)

## 2022-07-29 LAB — BRAIN NATRIURETIC PEPTIDE: B Natriuretic Peptide: 68.8 pg/mL (ref 0.0–100.0)

## 2022-07-29 MED ORDER — ACETAMINOPHEN 325 MG PO TABS: 650.0000 mg | ORAL_TABLET | Freq: Four times a day (QID) | ORAL | Status: DC | PRN

## 2022-07-29 MED ORDER — ATORVASTATIN CALCIUM 20 MG PO TABS
40.0000 mg | ORAL_TABLET | Freq: Every day | ORAL | Status: DC
  Administered 2022-07-29 – 2022-08-01 (×4): 40 mg via ORAL
  Filled 2022-07-29 (×4): qty 2

## 2022-07-29 MED ORDER — IPRATROPIUM-ALBUTEROL 0.5-2.5 (3) MG/3ML IN SOLN
3.0000 mL | Freq: Once | RESPIRATORY_TRACT | Status: AC
  Administered 2022-07-29: 3 mL via RESPIRATORY_TRACT
  Filled 2022-07-29: qty 3

## 2022-07-29 MED ORDER — POLYETHYLENE GLYCOL 3350 17 G PO PACK
17.0000 g | PACK | Freq: Two times a day (BID) | ORAL | Status: DC
  Administered 2022-07-29 – 2022-07-30 (×3): 17 g via ORAL
  Filled 2022-07-29 (×4): qty 1

## 2022-07-29 MED ORDER — CARVEDILOL 6.25 MG PO TABS
3.1250 mg | ORAL_TABLET | Freq: Two times a day (BID) | ORAL | Status: DC
  Administered 2022-07-29 – 2022-08-02 (×8): 3.125 mg via ORAL
  Filled 2022-07-29 (×8): qty 1

## 2022-07-29 MED ORDER — MOMETASONE FURO-FORMOTEROL FUM 100-5 MCG/ACT IN AERO
2.0000 | INHALATION_SPRAY | Freq: Two times a day (BID) | RESPIRATORY_TRACT | Status: DC
  Administered 2022-07-30 – 2022-08-02 (×8): 2 via RESPIRATORY_TRACT
  Filled 2022-07-29: qty 8.8

## 2022-07-29 MED ORDER — HYDRALAZINE HCL 20 MG/ML IJ SOLN: 5.0000 mg | INTRAMUSCULAR | Status: DC | PRN

## 2022-07-29 MED ORDER — HYDROCODONE-ACETAMINOPHEN 5-325 MG PO TABS
1.0000 | ORAL_TABLET | Freq: Four times a day (QID) | ORAL | Status: DC | PRN
  Administered 2022-07-29 – 2022-07-30 (×3): 1 via ORAL
  Filled 2022-07-29 (×3): qty 1

## 2022-07-29 MED ORDER — TORSEMIDE 20 MG PO TABS: 40.0000 mg | ORAL_TABLET | Freq: Every day | ORAL | Status: DC

## 2022-07-29 MED ORDER — ALBUTEROL SULFATE (2.5 MG/3ML) 0.083% IN NEBU: 2.5000 mg | INHALATION_SOLUTION | RESPIRATORY_TRACT | Status: DC | PRN

## 2022-07-29 MED ORDER — SPIRONOLACTONE 25 MG PO TABS
25.0000 mg | ORAL_TABLET | Freq: Every day | ORAL | Status: DC
  Administered 2022-07-29 – 2022-08-02 (×5): 25 mg via ORAL
  Filled 2022-07-29 (×5): qty 1

## 2022-07-29 MED ORDER — ALBUTEROL SULFATE (2.5 MG/3ML) 0.083% IN NEBU
5.0000 mg | INHALATION_SOLUTION | Freq: Once | RESPIRATORY_TRACT | Status: AC
  Administered 2022-07-29: 5 mg via RESPIRATORY_TRACT
  Filled 2022-07-29: qty 6

## 2022-07-29 MED ORDER — FUROSEMIDE 10 MG/ML IJ SOLN
40.0000 mg | Freq: Once | INTRAMUSCULAR | Status: AC
  Administered 2022-07-29: 40 mg via INTRAVENOUS
  Filled 2022-07-29: qty 4

## 2022-07-29 MED ORDER — TORSEMIDE 20 MG PO TABS
40.0000 mg | ORAL_TABLET | Freq: Every day | ORAL | Status: DC
  Administered 2022-07-30 – 2022-08-02 (×4): 40 mg via ORAL
  Filled 2022-07-29 (×4): qty 2

## 2022-07-29 MED ORDER — DM-GUAIFENESIN ER 30-600 MG PO TB12
1.0000 | ORAL_TABLET | Freq: Two times a day (BID) | ORAL | Status: DC | PRN
  Administered 2022-08-01 (×2): 1 via ORAL
  Filled 2022-07-29 (×2): qty 1

## 2022-07-29 MED ORDER — IPRATROPIUM-ALBUTEROL 0.5-2.5 (3) MG/3ML IN SOLN
3.0000 mL | RESPIRATORY_TRACT | Status: DC
  Administered 2022-07-29 – 2022-08-01 (×17): 3 mL via RESPIRATORY_TRACT
  Filled 2022-07-29 (×17): qty 3

## 2022-07-29 MED ORDER — IOHEXOL 300 MG/ML  SOLN
100.0000 mL | Freq: Once | INTRAMUSCULAR | Status: AC | PRN
  Administered 2022-07-29: 100 mL via INTRAVENOUS

## 2022-07-29 MED ORDER — ONDANSETRON HCL 4 MG/2ML IJ SOLN: 4.0000 mg | Freq: Three times a day (TID) | INTRAMUSCULAR | Status: DC | PRN

## 2022-07-29 MED ORDER — MAGNESIUM SULFATE 2 GM/50ML IV SOLN
2.0000 g | INTRAVENOUS | Status: AC
  Administered 2022-07-29: 2 g via INTRAVENOUS
  Filled 2022-07-29: qty 50

## 2022-07-29 MED ORDER — METHYLPREDNISOLONE SODIUM SUCC 125 MG IJ SOLR
80.0000 mg | INTRAMUSCULAR | Status: DC
  Administered 2022-07-30 – 2022-08-02 (×4): 80 mg via INTRAVENOUS
  Filled 2022-07-29 (×4): qty 2

## 2022-07-29 MED ORDER — ALBUTEROL SULFATE (2.5 MG/3ML) 0.083% IN NEBU
2.5000 mg | INHALATION_SOLUTION | Freq: Once | RESPIRATORY_TRACT | Status: AC
  Administered 2022-07-29: 2.5 mg via RESPIRATORY_TRACT
  Filled 2022-07-29: qty 3

## 2022-07-29 MED ORDER — METHYLPREDNISOLONE SODIUM SUCC 125 MG IJ SOLR
125.0000 mg | INTRAMUSCULAR | Status: AC
  Administered 2022-07-29: 125 mg via INTRAVENOUS
  Filled 2022-07-29: qty 2

## 2022-07-29 MED ORDER — PANTOPRAZOLE SODIUM 40 MG PO TBEC
40.0000 mg | DELAYED_RELEASE_TABLET | Freq: Every day | ORAL | Status: DC
  Administered 2022-07-30 – 2022-08-02 (×4): 40 mg via ORAL
  Filled 2022-07-29 (×4): qty 1

## 2022-07-29 MED ORDER — LORAZEPAM 0.5 MG PO TABS
0.5000 mg | ORAL_TABLET | Freq: Two times a day (BID) | ORAL | Status: DC | PRN
  Administered 2022-07-30: 0.5 mg via ORAL
  Filled 2022-07-29: qty 1

## 2022-07-29 MED ORDER — SENNOSIDES-DOCUSATE SODIUM 8.6-50 MG PO TABS
1.0000 | ORAL_TABLET | Freq: Two times a day (BID) | ORAL | Status: DC
  Administered 2022-07-29 – 2022-08-01 (×4): 1 via ORAL
  Filled 2022-07-29 (×6): qty 1

## 2022-07-29 MED ORDER — MORPHINE SULFATE (PF) 4 MG/ML IV SOLN
4.0000 mg | Freq: Once | INTRAVENOUS | Status: AC
  Administered 2022-07-29: 4 mg via INTRAVENOUS
  Filled 2022-07-29: qty 1

## 2022-07-29 MED ORDER — ENOXAPARIN SODIUM 40 MG/0.4ML IJ SOSY
40.0000 mg | PREFILLED_SYRINGE | INTRAMUSCULAR | Status: DC
  Administered 2022-07-29 – 2022-08-01 (×4): 40 mg via SUBCUTANEOUS
  Filled 2022-07-29 (×5): qty 0.4

## 2022-07-29 NOTE — ED Provider Notes (Signed)
[facility] Provider Note    Event Date/Time   First MD Initiated Contact with Patient 07/29/22 1418     (approximate)   History   Chief Complaint: Shortness of Breath and Constipation (Pt. To ED from home with shortness of breath x3 days, generalized swelling, and constipation x29 days. Hx. Of CHF. Pt. States she has taken many laxatives and stool softeners over the past several weeks. Pt. States she is passing gas.)   HPI  Elizabeth [name] is a 61 y.o. female with a history of chronic pain, CHF, hypertension, COPD, anxiety who comes the ED complaining of shortness of breath for the past 3 days with wheezing.  She also reports worsening bilateral lower extremity edema.  She also complains of constipation and absolutely no bowel movement for the last month.  Denies vomiting.  She is eating and drinking okay.  No fever, no chest pain.     Physical Exam   Triage Vital Signs: ED Triage Vitals  Enc Vitals Group     BP 07/29/22 1115 131/67     Pulse Rate 07/29/22 1027 89     Resp 07/29/22 1027 (!) 26     Temp 07/29/22 1027 98.2 F (36.8 C)     Temp Source 07/29/22 1027 Oral     SpO2 07/29/22 1027 100 %     Weight --      Height 07/29/22 1028 4\' 9"  (1.448 m)     Head Circumference --      Peak Flow --      Pain Score 07/29/22 1028 9     Pain Loc --      Pain Edu? --      Excl. in [place]? --     Most recent vital signs: Vitals:   07/29/22 1300 07/29/22 1400  BP: 131/68 (!) 122/92  Pulse: 77 70  Resp: 19 16  Temp:    SpO2: 100% 100%    General: Awake, mild respiratory distress CV:  Good peripheral perfusion.  Regular rate and rhythm Resp:  Normal effort.  Tachypnea, diffuse expiratory wheezing and prolonged expiratory phase.  No focal crackles. Abd:  Moderately distended.  Soft and nontender, not tympanitic, not peritoneal. Other:  3+ pitting edema bilateral lower extremities.   ED Results / Procedures / Treatments   Labs (all labs  ordered are listed, but only abnormal results are displayed) Labs Reviewed  BASIC METABOLIC PANEL - Abnormal; Notable for the following components:      Result Value   Glucose, Bld 131 (*)    BUN 24 (*)    All other components within normal limits  CBC - Abnormal; Notable for the following components:   RBC 3.32 (*)    Hemoglobin 7.8 (*)    HCT 28.0 (*)    MCH 23.5 (*)    MCHC 27.9 (*)    RDW 17.5 (*)    All other components within normal limits  RESP PANEL BY RT-PCR (FLU A&B, COVID) ARPGX2  EXPECTORATED SPUTUM ASSESSMENT W GRAM STAIN, RFLX TO RESP C  BRAIN NATRIURETIC PEPTIDE  TROPONIN I (HIGH SENSITIVITY)     EKG Interpreted by me Sinus rhythm rate of 90.  Normal axis, normal intervals.  Normal QRS ST segments and T waves.   RADIOLOGY Chest x-ray interpreted by me, appears normal.  Radiology report reviewed.  CT abdomen pelvis unremarkable, negative for SBO  PROCEDURES:  Procedures   MEDICATIONS ORDERED IN ED: Medications  ipratropium-albuterol (DUONEB) 0.5-2.5 (3) MG/3ML  nebulizer solution 3 mL (has no administration in time range)  albuterol (PROVENTIL) (2.5 MG/3ML) 0.083% nebulizer solution 2.5 mg (has no administration in time range)  dextromethorphan-guaiFENesin (MUCINEX DM) 30-600 MG per 12 hr tablet 1 tablet (has no administration in time range)  ondansetron (ZOFRAN) injection 4 mg (has no administration in time range)  acetaminophen (TYLENOL) tablet 650 mg (has no administration in time range)  hydrALAZINE (APRESOLINE) injection 5 mg (has no administration in time range)  methylPREDNISolone sodium succinate (SOLU-MEDROL) 125 mg/2 mL injection 80 mg (has no administration in time range)  enoxaparin (LOVENOX) injection 40 mg (has no administration in time range)  senna-docusate (Senokot-S) tablet 1 tablet (has no administration in time range)  polyethylene glycol (MIRALAX / GLYCOLAX) packet 17 g (has no administration in time range)  HYDROcodone-acetaminophen  (NORCO/VICODIN) 5-325 MG per tablet 1 tablet (has no administration in time range)  atorvastatin (LIPITOR) tablet 40 mg (has no administration in time range)  carvedilol (COREG) tablet 3.125 mg (has no administration in time range)  spironolactone (ALDACTONE) tablet 25 mg (has no administration in time range)  pantoprazole (PROTONIX) EC tablet 40 mg (has no administration in time range)  mometasone-formoterol (DULERA) 100-5 MCG/ACT inhaler 2 puff (has no administration in time range)  torsemide (DEMADEX) tablet 40 mg (has no administration in time range)  LORazepam (ATIVAN) tablet 0.5 mg (has no administration in time range)  albuterol (PROVENTIL) (2.5 MG/3ML) 0.083% nebulizer solution 2.5 mg (2.5 mg Nebulization Given 07/29/22 1121)  methylPREDNISolone sodium succinate (SOLU-MEDROL) 125 mg/2 mL injection 125 mg (125 mg Intravenous Given 07/29/22 1237)  magnesium sulfate IVPB 2 g 50 mL (0 g Intravenous Stopped 07/29/22 1300)  ipratropium-albuterol (DUONEB) 0.5-2.5 (3) MG/3ML nebulizer solution 3 mL (3 mLs Nebulization Given 07/29/22 1236)  albuterol (PROVENTIL) (2.5 MG/3ML) 0.083% nebulizer solution 5 mg (5 mg Nebulization Given 07/29/22 1235)  furosemide (LASIX) injection 40 mg (40 mg Intravenous Given 07/29/22 1239)  morphine (PF) 4 MG/ML injection 4 mg (4 mg Intravenous Given 07/29/22 1252)  iohexol (OMNIPAQUE) 300 MG/ML solution 100 mL (100 mLs Intravenous Contrast Given 07/29/22 1320)     IMPRESSION / MDM / ASSESSMENT AND PLAN / ED COURSE  I reviewed the triage vital signs and the nursing notes.                              Differential diagnosis includes, but is not limited to, COPD exacerbation, pleural effusion, pulmonary edema, non-STEMI, CHF exacerbation, obstruction, perforation, ileus  Patient's presentation is most consistent with acute presentation with potential threat to life or bodily function.  Patient brought to the ED due to worsening shortness of breath.  Clinically  she has a COPD exacerbation.  Vital signs are unremarkable except for tachypnea.  She is not septic.  Labs and chest x-ray are unremarkable, CT abdomen pelvis unremarkable.  Patient given magnesium steroids bronchodilators and discussed with the hospitalist regarding admission.       FINAL CLINICAL IMPRESSION(S) / ED DIAGNOSES   Final diagnoses:  COPD exacerbation ([place])     Rx / DC Orders   ED Discharge Orders     None        Note:  This document was prepared using Dragon voice recognition software and may include unintentional dictation errors.   Carrie Mew, MD 07/29/22 1520

## 2022-07-29 NOTE — Assessment & Plan Note (Signed)
Patient has dry cough, shortness of breath, mild wheezing, clinically consistent with COPD exacerbation.  Denies chest pain.  Chest x-ray negative.  No oxygen desaturation.   - will admit to tele bed as inpatient oxygen saturation-Bronchodilators -Patient received 2 g of magnesium sulfate in ED -Solu-Medrol 40 mg IV bid - Abx: will not give Abx since patient does not have productive cough, fever or leukocytosis. -Mucinex for cough  -Incentive spirometry -sputum culture -check Covid19 pcr -Nasal cannula oxygen as needed to maintain O2 saturation 93% or greater

## 2022-07-29 NOTE — Assessment & Plan Note (Signed)
2D echo on 05/15/2022 showed EF 60-65% with grade 1 diastolic dysfunction.  Patient has 3+ bilateral lower leg edema, but no pulmonary edema on chest x-ray, BNP is normal 68.8, clinically does not seem to have CHF exacerbation.  -Patient received 40 mg of IV Lasix in ED -Will increase torsemide from 20 to 40 mg daily due to worsening leg edema -Continue spironolactone 25 mg daily -Follow-up lower extremity venous Doppler to rule out a DVT

## 2022-07-29 NOTE — Assessment & Plan Note (Signed)
-   IV hydralazine as needed -Patient is on torsemide, spironolactone -Coreg,

## 2022-07-29 NOTE — Assessment & Plan Note (Signed)
CT abdomen/pelvis is negative for bowel obstruction.  Leg due to chronic pain medication use. -MiraLAX 17 g twice daily -Senokot 1 tablet twice daily

## 2022-07-29 NOTE — Assessment & Plan Note (Signed)
-  start prn ativan 0.5 mg bid

## 2022-07-29 NOTE — Assessment & Plan Note (Signed)
-  continue home prn Norco -prn tylenol

## 2022-07-29 NOTE — ED Triage Notes (Signed)
Pt. To ED from home with shortness of breath x3 days, generalized swelling, and constipation x29 days. Hx. Of CHF. Pt. States she has taken many laxatives and stool softeners over the past several weeks. Pt. States she is passing gas.

## 2022-07-29 NOTE — ED Notes (Signed)
Pt. To rad. 

## 2022-07-29 NOTE — H&P (Signed)
History and Physical    Elizabeth [name] [id number] DOB: 1961-09-22 DOA: 07/29/2022  Referring MD/NP/PA:   PCP: Bunnie Pion, FNP   Patient coming from:  The patient is coming from home.  At baseline, pt is independent for most of ADL.        Chief Complaint: Shortness of breath  HPI: Elizabeth [name] is a 61 y.o. female with medical history significant of dCHF, COPD, hypertension, hyperlipidemia, GERD, anxiety, reflux sympathetic dystrophy, chronic pain syndrome, chronic back pain, s/p of spinal cord stimulator placement, former smoker, who presents with shortness breath.  Patient states that he is short of breath for more than 3 days, which has been progressively worsening.  Patient has mild dry cough and loud wheezing. No chest pain.  No fever or chills. She reports that her leg edema has been progressively worsening recently.  Patient is constipated for almost 1 month.  She took MiraLAX and Colace without improvement. No nausea or vomiting.  Patient reports diffuse abdominal pain, which is mild to moderate, aching, nonradiating.  No symptoms of UTI.  Data reviewed independently and ED Course: pt was found to have BNP 68.8, WBC 6.4, troponin level 4, GFR> 60, temperature normal, blood pressure 122/92, heart rate 89, RR 26, 16, oxygen saturation 100% on room air.  Chest x-ray negative.  CT abdomen/pelvis negative for acute intra-abdominal issues except for possible tiny gallstone.  Patient is admitted to telemetry bed as inpatient.  CT-abd/pelvis: There is no evidence of intestinal obstruction or pneumoperitoneum. There is no hydronephrosis. High density in the dependent portion of gallbladder lumen suggests presence of sludge and possibly tiny stones.   Lumbar spondylosis.   EKG: I have personally reviewed.  Sinus rhythm, QTc 428, low voltage.   Review of Systems:   General: no fevers, chills, no body weight gain, has fatigue HEENT: no blurry vision, hearing changes  or sore throat Respiratory: has dyspnea, coughing, wheezing CV: no chest pain, no palpitations GI: no nausea, vomiting, has abdominal pain and constipation GU: no dysuria, burning on urination, increased urinary frequency, hematuria  Ext: has leg edema Neuro: no unilateral weakness, numbness, or tingling, no vision change or hearing loss Skin: no rash, no skin tear. MSK: No muscle spasm, no deformity, no limitation of range of movement in spin Heme: No easy bruising.  Travel history: No recent long distant travel.   Allergy:  Allergies  Allergen Reactions   Lisinopril Anaphylaxis    Past Medical History:  Diagnosis Date   Anxiety    Arm pain 07/25/2015   CHF (congestive heart failure) (HCC)    Congestive heart failure ([place]) 07/25/2015   COPD (chronic obstructive pulmonary disease) ([place])    never been idagnosed   Depression    Hypertension    Lower extremity pain 07/25/2015   Reflux    RSD (reflex sympathetic dystrophy)    Spinal cord stimulator status    Vitamin B12 deficiency    Vitamin D deficiency     Past Surgical History:  Procedure Laterality Date   SPINAL CORD STIMULATOR IMPLANT     x 2    Social History:  reports that she quit smoking about 4 years ago. Her smoking use included cigarettes. She has a 60.00 pack-year smoking history. She has never used smokeless tobacco. She reports that she does not drink alcohol and does not use drugs.  Family History:  Family History  Problem Relation Age of Onset   Cirrhosis Mother    Cancer Father  Prior to Admission medications   Medication Sig Start Date End Date Taking? Authorizing Provider  albuterol (PROVENTIL HFA;VENTOLIN HFA) 108 (90 Base) MCG/ACT inhaler Inhale 2 puffs into the lungs every 6 (six) hours as needed for wheezing or shortness of breath. 11/26/17   Gladstone Lighter, MD  atorvastatin (LIPITOR) 40 MG tablet Take 1 tablet (40 mg total) by mouth at bedtime. 08/18/18   Gladstone Lighter, MD   carvedilol (COREG) 3.125 MG tablet Take 1 tablet (3.125 mg total) by mouth 2 (two) times daily with a meal. 08/18/18   Gladstone Lighter, MD  HYDROcodone-acetaminophen (NORCO/VICODIN) 5-325 MG tablet Take 1 tablet by mouth every 6 (six) hours as needed for severe pain. Must last 30 days. 06/24/22 07/24/22  Milinda Pointer, MD  HYDROcodone-acetaminophen (NORCO/VICODIN) 5-325 MG tablet Take 1 tablet by mouth every 6 (six) hours as needed for severe pain. Must last 30 days. 07/24/22 08/23/22  Milinda Pointer, MD  HYDROcodone-acetaminophen (NORCO/VICODIN) 5-325 MG tablet Take 1 tablet by mouth every 6 (six) hours as needed for severe pain. Must last 30 days. 08/23/22 09/22/22  Milinda Pointer, MD  ipratropium-albuterol (DUONEB) 0.5-2.5 (3) MG/3ML SOLN Take 3 mLs by nebulization every 6 (six) hours as needed (wheezing). 01/06/21   Fritzi Mandes, MD  mometasone-formoterol [facility]) 100-5 MCG/ACT AERO Inhale 2 puffs into the lungs 2 (two) times daily. 08/18/18 06/24/22  Gladstone Lighter, MD  naloxone [facility]) nasal spray 4 mg/0.1 mL Place 1 spray into the nose as needed for up to 365 doses (for opioid-induced respiratory depresssion). In case of emergency (overdose), spray once into each nostril. If no response within 3 minutes, repeat application and call [id number]. 03/11/22 03/11/23  Milinda Pointer, MD  pantoprazole (PROTONIX) 40 MG tablet Take 40 mg by mouth daily. 03/30/22   [provider]  torsemide (DEMADEX) 20 MG tablet Take 1 tablet (20 mg total) by mouth daily. 05/26/22 06/25/22  Sidney Ace, MD    Physical Exam: Vitals:   07/29/22 1200 07/29/22 1230 07/29/22 1300 07/29/22 1400  BP: 109/71 112/65 131/68 (!) 122/92  Pulse: 71 72 77 70  Resp: 19 18 19 16   Temp:      TempSrc:      SpO2: 100% 100% 100% 100%  Height:       General: Not in acute distress HEENT:       Eyes: PERRL, EOMI, no scleral icterus.       ENT: No discharge from the ears and nose, no pharynx injection, no  tonsillar enlargement.        Neck: No JVD, no bruit, no mass felt. Heme: No neck lymph node enlargement. Cardiac: S1/S2, RRR, No murmurs, No gallops or rubs. Respiratory: Has wheezing bilaterally GI: Soft, mildly distended, with diffused mild tenderness, no rebound pain, no organomegaly, BS present. GU: No hematuria Ext: 3+ pitting leg edema bilaterally. 1+DP/PT pulse bilaterally. Musculoskeletal: No joint deformities, No joint redness or warmth, no limitation of ROM in spin. Skin: No rashes.  Neuro: Alert, oriented X3, cranial nerves II-XII grossly intact, moves all extremities normally.  Psych: Patient is not psychotic, no suicidal or hemocidal ideation.  Labs on Admission: I have personally reviewed following labs and imaging studies  CBC: Recent Labs  Lab 07/29/22 1035  WBC 6.4  HGB 7.8*  HCT 28.0*  MCV 84.3  PLT 0000000   Basic Metabolic Panel: Recent Labs  Lab 07/29/22 1035  NA 139  K 4.5  CL 106  CO2 23  GLUCOSE 131*  BUN 24*  CREATININE 0.82  CALCIUM 9.2   GFR: CrCl cannot be calculated (Unknown ideal weight.). Liver Function Tests: No results for input(s): "AST", "ALT", "ALKPHOS", "BILITOT", "PROT", "ALBUMIN" in the last 168 hours. No results for input(s): "LIPASE", "AMYLASE" in the last 168 hours. No results for input(s): "AMMONIA" in the last 168 hours. Coagulation Profile: No results for input(s): "INR", "PROTIME" in the last 168 hours. Cardiac Enzymes: No results for input(s): "CKTOTAL", "CKMB", "CKMBINDEX", "TROPONINI" in the last 168 hours. BNP (last 3 results) No results for input(s): "PROBNP" in the last 8760 hours. HbA1C: No results for input(s): "HGBA1C" in the last 72 hours. CBG: No results for input(s): "GLUCAP" in the last 168 hours. Lipid Profile: No results for input(s): "CHOL", "HDL", "LDLCALC", "TRIG", "CHOLHDL", "LDLDIRECT" in the last 72 hours. Thyroid Function Tests: No results for input(s): "TSH", "T4TOTAL", "FREET4", "T3FREE",  "THYROIDAB" in the last 72 hours. Anemia Panel: No results for input(s): "VITAMINB12", "FOLATE", "FERRITIN", "TIBC", "IRON", "RETICCTPCT" in the last 72 hours. Urine analysis:    Component Value Date/Time   COLORURINE STRAW (A) 05/24/2018 1150   APPEARANCEUR CLEAR (A) 05/24/2018 1150   LABSPEC 1.005 05/24/2018 1150   PHURINE 5.0 05/24/2018 1150   GLUCOSEU NEGATIVE 05/24/2018 1150   HGBUR NEGATIVE 05/24/2018 [place] 05/24/2018 1150   KETONESUR NEGATIVE 05/24/2018 1150   PROTEINUR NEGATIVE 05/24/2018 1150   NITRITE NEGATIVE 05/24/2018 1150   LEUKOCYTESUR TRACE (A) 05/24/2018 1150   Sepsis Labs: @LABRCNTIP (procalcitonin:4,lacticidven:4) )No results found for this or any previous visit (from the past 240 hour(s)).   Radiological Exams on Admission: CT ABDOMEN PELVIS W CONTRAST  Result Date: 07/29/2022 CLINICAL DATA:  Constipation, shortness of breath EXAM: CT ABDOMEN AND PELVIS WITH CONTRAST TECHNIQUE: Multidetector CT imaging of the abdomen and pelvis was performed using the standard protocol following bolus administration of intravenous contrast. RADIATION DOSE REDUCTION: This exam was performed according to the departmental dose-optimization program which includes automated exposure control, adjustment of the mA and/or kV according to patient size and/or use of iterative reconstruction technique. CONTRAST:  147mL OMNIPAQUE IOHEXOL 300 MG/ML  SOLN COMPARISON:  05/24/2018 FINDINGS: Lower chest: Small linear densities in the lower lung fields may suggest minimal scarring. Hepatobiliary: No focal abnormalities are seen in liver. There is slight prominence of intrahepatic bile ducts. There is high density in the dependent portion of gallbladder lumen. There is no wall thickening in gallbladder. There is no fluid around the gallbladder. Pancreas: No focal abnormalities are seen. Diverticulum is noted along the inner margin of second portion of duodenum. Spleen: Unremarkable.  Adrenals/Urinary Tract: Adrenals are unremarkable. There is no significant hydronephrosis. Right renal pelvis is more prominent, possibly due to extrarenal location. Ureters are not dilated. There are multiple calcifications in both sides of pelvis, possibly vascular calcifications. Urinary bladder is unremarkable. Stomach/Bowel: Stomach is not distended. Small bowel loops are not dilated. Appendix is not dilated. There is no significant wall thickening in colon. There is no pericolic stranding. Vascular/Lymphatic: Arterial calcifications are seen. Reproductive: Possible calcified uterine fibroid is seen. Other: There is no ascites or pneumoperitoneum. There is an electronic device in subcutaneous plane in the left gluteal region with lead extending towards the thoracic spine. Musculoskeletal: There is first-degree anterolisthesis at L4-L5 level. Degenerative changes are noted in facet joints, more so at L4-L5 level. IMPRESSION: There is no evidence of intestinal obstruction or pneumoperitoneum. There is no hydronephrosis. High density in the dependent portion of gallbladder lumen suggests presence of sludge and possibly tiny stones. Lumbar spondylosis. Electronically Signed  By: Elmer Picker M.D.   On: 07/29/2022 14:00   DG Chest 2 View  Result Date: 07/29/2022 CLINICAL DATA:  Shortness of breath. Denies swelling. Audible wheezing. EXAM: CHEST - 2 VIEW COMPARISON:  AP chest 05/14/2022, 04/17/2021; CT chest 12/18/2020 FINDINGS: Cardiac silhouette is at the upper limits of normal size. Mediastinal contours are within normal limits with mild calcification within the aortic arch. Mild bilateral lower lung interstitial thickening is unchanged from prior, mild chronic scarring. No new focal airspace opacity to indicate pneumonia. No pulmonary edema, pleural effusion, or pneumothorax. Mild multilevel degenerative disc changes of the thoracic spine. A spinal cord stimulator electrode is again seen, with leads  overlying the lower cervical spine. IMPRESSION: No active cardiopulmonary disease. Electronically Signed   By: Yvonne Kendall M.D.   On: 07/29/2022 10:59      Assessment/Plan Principal Problem:   COPD exacerbation (HCC) Active Problems:   Chronic diastolic CHF (congestive heart failure) (HCC)   HTN (hypertension)   HLD (hyperlipidemia)   Constipation   Anxiety   Chronic pain syndrome   Assessment and Plan: * COPD exacerbation ([place]) Patient has dry cough, shortness of breath, mild wheezing, clinically consistent with COPD exacerbation.  Denies chest pain.  Chest x-ray negative.  No oxygen desaturation.   - will admit to tele bed as inpatient oxygen saturation-Bronchodilators -Patient received 2 g of magnesium sulfate in ED -Solu-Medrol 40 mg IV bid - Abx: will not give Abx since patient does not have productive cough, fever or leukocytosis. -Mucinex for cough  -Incentive spirometry -sputum culture -check Covid19 pcr -Nasal cannula oxygen as needed to maintain O2 saturation 93% or greater   Chronic diastolic CHF (congestive heart failure) (HCC) 2D echo on 05/15/2022 showed EF 60-65% with grade 1 diastolic dysfunction.  Patient has 3+ bilateral lower leg edema, but no pulmonary edema on chest x-ray, BNP is normal 68.8, clinically does not seem to have CHF exacerbation.  -Patient received 40 mg of IV Lasix in ED -Will increase torsemide from 20 to 40 mg daily due to worsening leg edema -Continue spironolactone 25 mg daily -Follow-up lower extremity venous Doppler to rule out a DVT  HTN (hypertension) - IV hydralazine as needed -Patient is on torsemide, spironolactone -Coreg,  HLD (hyperlipidemia) - Lipitor  Constipation CT abdomen/pelvis is negative for bowel obstruction.  Leg due to chronic pain medication use. -MiraLAX 17 g twice daily -Senokot 1 tablet twice daily   Anxiety -start prn ativan 0.5 mg bid  Chronic pain syndrome -continue home prn Norco -prn  tylenol          DVT ppx: SQ Lovenox  Code Status: Full code  Family Communication: Patient states that her son was with her earlier, who knows what is going on for her, therefore I do not need to call her family.  Disposition Plan:  Anticipate discharge back to previous environment  Consults called:  none  Admission status and Level of care: Telemetry Medical:    as inpt         Dispo: The patient is from: Home              Anticipated d/c is to: Home              Anticipated d/c date is: 2 days              Patient currently is not medically stable to d/c.    Severity of Illness:  The appropriate patient status for this patient is INPATIENT.  Inpatient status is judged to be reasonable and necessary in order to provide the required intensity of service to ensure the patient's safety. The patient's presenting symptoms, physical exam findings, and initial radiographic and laboratory data in the context of their chronic comorbidities is felt to place them at high risk for further clinical deterioration. Furthermore, it is not anticipated that the patient will be medically stable for discharge from the hospital within 2 midnights of admission.   * I certify that at the point of admission it is my clinical judgment that the patient will require inpatient hospital care spanning beyond 2 midnights from the point of admission due to high intensity of service, high risk for further deterioration and high frequency of surveillance required.*       Date of Service 07/29/2022    Ivor Costa [facility]   If 7PM-7AM, please contact night-coverage [email or web address] 07/29/2022, 3:09 PM

## 2022-07-29 NOTE — ED Notes (Signed)
In triage, despite pt's VS being stable, pt. Has very labored breathing, and insp. And exp. Wheezing. First nurse notified pt needs room.

## 2022-07-29 NOTE — Assessment & Plan Note (Signed)
Lipitor 

## 2022-07-30 ENCOUNTER — Encounter: Payer: Self-pay | Admitting: Internal Medicine

## 2022-07-30 DIAGNOSIS — K5900 Constipation, unspecified: Secondary | ICD-10-CM | POA: Diagnosis not present

## 2022-07-30 DIAGNOSIS — G894 Chronic pain syndrome: Secondary | ICD-10-CM | POA: Diagnosis not present

## 2022-07-30 DIAGNOSIS — J441 Chronic obstructive pulmonary disease with (acute) exacerbation: Secondary | ICD-10-CM | POA: Diagnosis not present

## 2022-07-30 LAB — BASIC METABOLIC PANEL
Anion gap: 8 (ref 5–15)
BUN: 23 mg/dL (ref 8–23)
CO2: 27 mmol/L (ref 22–32)
Calcium: 8.9 mg/dL (ref 8.9–10.3)
Chloride: 101 mmol/L (ref 98–111)
Creatinine, Ser: 0.77 mg/dL (ref 0.44–1.00)
GFR, Estimated: >60 mL/min (ref >60)
Glucose, Bld: 132 mg/dL — ABNORMAL HIGH (ref 70–99)
Potassium: 4.1 mmol/L (ref 3.5–5.1)
Sodium: 136 mmol/L (ref 135–145)

## 2022-07-30 MED ORDER — LACTULOSE 10 GM/15ML PO SOLN
30.0000 g | Freq: Three times a day (TID) | ORAL | Status: DC
  Administered 2022-07-30: 30 g via ORAL
  Filled 2022-07-30 (×2): qty 60

## 2022-07-30 MED ORDER — MORPHINE SULFATE (PF) 2 MG/ML IV SOLN
1.0000 mg | INTRAVENOUS | Status: DC | PRN
  Administered 2022-07-30 – 2022-08-02 (×15): 1 mg via INTRAVENOUS
  Filled 2022-07-30 (×15): qty 1

## 2022-07-30 NOTE — Progress Notes (Signed)
PROGRESS NOTE    Elizabeth [name]  [id number] DOB: Feb 01, 1961 DOA: 07/29/2022 PCP: Bunnie Pion, FNP   Assessment & Plan:   Principal Problem:   COPD exacerbation ([place]) Active Problems:   Chronic diastolic CHF (congestive heart failure) (HCC)   HTN (hypertension)   HLD (hyperlipidemia)   Constipation   Anxiety   Chronic pain syndrome  Assessment and Plan: COPD exacerbation: has dry cough, shortness of breath, wheezing, clinically consistent with COPD exacerbation.  CXR negative.  No oxygen desaturation. Continue on bronchodilators, steroids & encourage incentive spirometry   Chronic diastolic CHF: echo on [id number] showed EF 60-65% with grade 1 diastolic dysfunction. Has 3+ bilateral lower leg edema, but no pulmonary edema on chest x-ray, BNP is normal 68.8, clinically does not seem to have CHF exacerbation. Continue on increased dose of torsemide and continue home dose of aldacone    HTN: continue on coreg, aldactone, torsemide. IV hydralazine prn    HLD: continue on statin    Constipation: likely secondary to chronic pain medication use. Continue on miralax, senokot and started on lactulose. CT abdomen/pelvis is negative for bowel obstruction.    Anxiety: severity unknown. Ativan prn    Chronic pain syndrome: continue on home dose of norco. Morphine prn      DVT prophylaxis: lovenox  Code Status: full  Family Communication:  Disposition Plan: likely d/c back home   Level of care: Telemetry Medical  Status is: Inpatient Remains inpatient appropriate because: severity of illness    Consultants:    Procedures:   Antimicrobials:   Subjective: Pt c/o wheezing and constipation   Objective: Vitals:   07/30/22 0530 07/30/22 0600 07/30/22 0700 07/30/22 0800  BP: 106/65 109/65 105/68 128/84  Pulse: 72 73 70 96  Resp: 15 14 15    Temp:    98.4 F (36.9 C)  TempSrc:    Oral  SpO2: 93% 100% 100% 95%  Height:        Intake/Output Summary (Last 24  hours) at 07/30/2022 0824 Last data filed at 07/29/2022 1430 Gross per 24 hour  Intake --  Output 1700 ml  Net -1700 ml   There were no vitals filed for this visit.  Examination:  General exam: Appears calm and comfortable  Respiratory system: course breath sounds b/l. Wheezing b/l  Cardiovascular system: S1 & S2+. No rubs, gallops or clicks. No pedal edema. Gastrointestinal system: Abdomen is obese, soft and nontender. Hypoactive bowel sounds heard. Central nervous system: Alert and oriented. Moves all extremities  Psychiatry: Judgement and insight appear normal. Mood & affect appropriate.     Data Reviewed: I have personally reviewed following labs and imaging studies  CBC: Recent Labs  Lab 07/29/22 1035  WBC 6.4  HGB 7.8*  HCT 28.0*  MCV 84.3  PLT 0000000   Basic Metabolic Panel: Recent Labs  Lab 07/29/22 1035 07/30/22 0401  NA 139 136  K 4.5 4.1  CL 106 101  CO2 23 27  GLUCOSE 131* 132*  BUN 24* 23  CREATININE 0.82 0.77  CALCIUM 9.2 8.9   GFR: CrCl cannot be calculated (Unknown ideal weight.). Liver Function Tests: No results for input(s): "AST", "ALT", "ALKPHOS", "BILITOT", "PROT", "ALBUMIN" in the last 168 hours. No results for input(s): "LIPASE", "AMYLASE" in the last 168 hours. No results for input(s): "AMMONIA" in the last 168 hours. Coagulation Profile: No results for input(s): "INR", "PROTIME" in the last 168 hours. Cardiac Enzymes: No results for input(s): "CKTOTAL", "CKMB", "CKMBINDEX", "TROPONINI" in the last 168  hours. BNP (last 3 results) No results for input(s): "PROBNP" in the last 8760 hours. HbA1C: No results for input(s): "HGBA1C" in the last 72 hours. CBG: No results for input(s): "GLUCAP" in the last 168 hours. Lipid Profile: No results for input(s): "CHOL", "HDL", "LDLCALC", "TRIG", "CHOLHDL", "LDLDIRECT" in the last 72 hours. Thyroid Function Tests: No results for input(s): "TSH", "T4TOTAL", "FREET4", "T3FREE", "THYROIDAB" in the  last 72 hours. Anemia Panel: No results for input(s): "VITAMINB12", "FOLATE", "FERRITIN", "TIBC", "IRON", "RETICCTPCT" in the last 72 hours. Sepsis Labs: No results for input(s): "PROCALCITON", "LATICACIDVEN" in the last 168 hours.  Recent Results (from the past 240 hour(s))  Resp Panel by RT-PCR (Flu A&B, Covid) Anterior Nasal Swab     Status: None   Collection Time: 07/29/22  2:28 PM   Specimen: Anterior Nasal Swab  Result Value Ref Range Status   SARS Coronavirus 2 by RT PCR NEGATIVE NEGATIVE Final    Comment: (NOTE) SARS-CoV-2 target nucleic acids are NOT DETECTED.  The SARS-CoV-2 RNA is generally detectable in upper respiratory specimens during the acute phase of infection. The lowest concentration of SARS-CoV-2 viral copies this assay can detect is 138 copies/mL. A negative result does not preclude SARS-Cov-2 infection and should not be used as the sole basis for treatment or other patient management decisions. A negative result may occur with  improper specimen collection/handling, submission of specimen other than nasopharyngeal swab, presence of viral mutation(s) within the areas targeted by this assay, and inadequate number of viral copies(<138 copies/mL). A negative result must be combined with clinical observations, patient history, and epidemiological information. The expected result is Negative.  Fact Sheet for Patients:  EntrepreneurPulse.com.au  Fact Sheet for Healthcare Providers:  IncredibleEmployment.be  This test is no t yet approved or cleared by the [place] FDA and  has been authorized for detection and/or diagnosis of SARS-CoV-2 by FDA under an Emergency Use Authorization (EUA). This EUA will remain  in effect (meaning this test can be used) for the duration of the COVID-19 declaration under Section 564(b)(1) of the Act, 21 U.S.C.section 360bbb-3(b)(1), unless the authorization is terminated  or revoked sooner.        Influenza A by PCR NEGATIVE NEGATIVE Final   Influenza B by PCR NEGATIVE NEGATIVE Final    Comment: (NOTE) The Xpert Xpress SARS-CoV-2/FLU/RSV plus assay is intended as an aid in the diagnosis of influenza from Nasopharyngeal swab specimens and should not be used as a sole basis for treatment. Nasal washings and aspirates are unacceptable for Xpert Xpress SARS-CoV-2/FLU/RSV testing.  Fact Sheet for Patients: EntrepreneurPulse.com.au  Fact Sheet for Healthcare Providers: IncredibleEmployment.be  This test is not yet approved or cleared by the [place] FDA and has been authorized for detection and/or diagnosis of SARS-CoV-2 by FDA under an Emergency Use Authorization (EUA). This EUA will remain in effect (meaning this test can be used) for the duration of the COVID-19 declaration under Section 564(b)(1) of the Act, 21 U.S.C. section 360bbb-3(b)(1), unless the authorization is terminated or revoked.  Performed at [facility], [address] [place], [place] [address]          Radiology Studies: US Venous Img Lower Bilateral (DVT)  Result Date: 07/29/2022 CLINICAL DATA:  Leg edema EXAM: BILATERAL LOWER EXTREMITY VENOUS DOPPLER ULTRASOUND TECHNIQUE: Gray-scale sonography with graded compression, as well as color Doppler and duplex ultrasound were performed to evaluate the lower extremity deep venous systems from the level of the common femoral vein and including the common femoral,  femoral, profunda femoral, popliteal and calf veins including the posterior tibial, peroneal and gastrocnemius veins when visible. The superficial great saphenous vein was also interrogated. Spectral Doppler was utilized to evaluate flow at rest and with distal augmentation maneuvers in the common femoral, femoral and popliteal veins. COMPARISON:  03/08/2014, 08/15/2010 FINDINGS: RIGHT LOWER EXTREMITY Common Femoral Vein: No evidence of thrombus.  Normal compressibility, respiratory phasicity and response to augmentation. Saphenofemoral Junction: No evidence of thrombus. Normal compressibility and flow on color Doppler imaging. Profunda Femoral Vein: No evidence of thrombus. Normal compressibility and flow on color Doppler imaging. Femoral Vein: No evidence of thrombus. Normal compressibility, respiratory phasicity and response to augmentation. Popliteal Vein: No evidence of thrombus. Normal compressibility, respiratory phasicity and response to augmentation. Calf Veins: No evidence of thrombus. Normal compressibility and flow on color Doppler imaging. Superficial Great Saphenous Vein: No evidence of thrombus. Normal compressibility. Venous Reflux:  None. Other Findings:  None. LEFT LOWER EXTREMITY Common Femoral Vein: No evidence of thrombus. Normal compressibility, respiratory phasicity and response to augmentation. Saphenofemoral Junction: No evidence of thrombus. Normal compressibility and flow on color Doppler imaging. Profunda Femoral Vein: No evidence of thrombus. Normal compressibility and flow on color Doppler imaging. Femoral Vein: No evidence of thrombus. Normal compressibility, respiratory phasicity and response to augmentation. Popliteal Vein: No evidence of thrombus. Normal compressibility, respiratory phasicity and response to augmentation. Calf Veins: No evidence of thrombus. Normal compressibility and flow on color Doppler imaging. Superficial Great Saphenous Vein: No evidence of thrombus. Normal compressibility. Venous Reflux:  None. Other Findings:  None. IMPRESSION: No evidence of deep venous thrombosis in either lower extremity. Electronically Signed   By: Davina Poke D.O.   On: 07/29/2022 16:24   CT ABDOMEN PELVIS W CONTRAST  Result Date: 07/29/2022 CLINICAL DATA:  Constipation, shortness of breath EXAM: CT ABDOMEN AND PELVIS WITH CONTRAST TECHNIQUE: Multidetector CT imaging of the abdomen and pelvis was performed using the  standard protocol following bolus administration of intravenous contrast. RADIATION DOSE REDUCTION: This exam was performed according to the departmental dose-optimization program which includes automated exposure control, adjustment of the mA and/or kV according to patient size and/or use of iterative reconstruction technique. CONTRAST:  132mL OMNIPAQUE IOHEXOL 300 MG/ML  SOLN COMPARISON:  05/24/2018 FINDINGS: Lower chest: Small linear densities in the lower lung fields may suggest minimal scarring. Hepatobiliary: No focal abnormalities are seen in liver. There is slight prominence of intrahepatic bile ducts. There is high density in the dependent portion of gallbladder lumen. There is no wall thickening in gallbladder. There is no fluid around the gallbladder. Pancreas: No focal abnormalities are seen. Diverticulum is noted along the inner margin of second portion of duodenum. Spleen: Unremarkable. Adrenals/Urinary Tract: Adrenals are unremarkable. There is no significant hydronephrosis. Right renal pelvis is more prominent, possibly due to extrarenal location. Ureters are not dilated. There are multiple calcifications in both sides of pelvis, possibly vascular calcifications. Urinary bladder is unremarkable. Stomach/Bowel: Stomach is not distended. Small bowel loops are not dilated. Appendix is not dilated. There is no significant wall thickening in colon. There is no pericolic stranding. Vascular/Lymphatic: Arterial calcifications are seen. Reproductive: Possible calcified uterine fibroid is seen. Other: There is no ascites or pneumoperitoneum. There is an electronic device in subcutaneous plane in the left gluteal region with lead extending towards the thoracic spine. Musculoskeletal: There is first-degree anterolisthesis at L4-L5 level. Degenerative changes are noted in facet joints, more so at L4-L5 level. IMPRESSION: There is no evidence of intestinal obstruction or pneumoperitoneum. There  is no  hydronephrosis. High density in the dependent portion of gallbladder lumen suggests presence of sludge and possibly tiny stones. Lumbar spondylosis. Electronically Signed   By: Elmer Picker M.D.   On: 07/29/2022 14:00   DG Chest 2 View  Result Date: 07/29/2022 CLINICAL DATA:  Shortness of breath. Denies swelling. Audible wheezing. EXAM: CHEST - 2 VIEW COMPARISON:  AP chest 05/14/2022, 04/17/2021; CT chest 12/18/2020 FINDINGS: Cardiac silhouette is at the upper limits of normal size. Mediastinal contours are within normal limits with mild calcification within the aortic arch. Mild bilateral lower lung interstitial thickening is unchanged from prior, mild chronic scarring. No new focal airspace opacity to indicate pneumonia. No pulmonary edema, pleural effusion, or pneumothorax. Mild multilevel degenerative disc changes of the thoracic spine. A spinal cord stimulator electrode is again seen, with leads overlying the lower cervical spine. IMPRESSION: No active cardiopulmonary disease. Electronically Signed   By: Yvonne Kendall M.D.   On: 07/29/2022 10:59        Scheduled Meds:  atorvastatin  40 mg Oral QHS   carvedilol  3.125 mg Oral BID WC   enoxaparin (LOVENOX) injection  40 mg Subcutaneous Q24H   ipratropium-albuterol  3 mL Nebulization Q4H   methylPREDNISolone (SOLU-MEDROL) injection  80 mg Intravenous Q24H   mometasone-formoterol  2 puff Inhalation BID   pantoprazole  40 mg Oral Daily   polyethylene glycol  17 g Oral BID   senna-docusate  1 tablet Oral BID   spironolactone  25 mg Oral Daily   torsemide  40 mg Oral Daily   Continuous Infusions:   LOS: 1 day    Time spent: 35 mins     Wyvonnia Dusky, MD [facility] Pager [phone]  If 7PM-7AM, please contact night-coverage [email or web address] 07/30/2022, 8:24 AM

## 2022-07-31 DIAGNOSIS — G894 Chronic pain syndrome: Secondary | ICD-10-CM | POA: Diagnosis not present

## 2022-07-31 DIAGNOSIS — K5900 Constipation, unspecified: Secondary | ICD-10-CM | POA: Diagnosis not present

## 2022-07-31 DIAGNOSIS — J441 Chronic obstructive pulmonary disease with (acute) exacerbation: Secondary | ICD-10-CM | POA: Diagnosis not present

## 2022-07-31 LAB — BASIC METABOLIC PANEL
Anion gap: 11 (ref 5–15)
BUN: 34 mg/dL — ABNORMAL HIGH (ref 8–23)
CO2: 28 mmol/L (ref 22–32)
Calcium: 8.7 mg/dL — ABNORMAL LOW (ref 8.9–10.3)
Chloride: 97 mmol/L — ABNORMAL LOW (ref 98–111)
Creatinine, Ser: 0.8 mg/dL (ref 0.44–1.00)
GFR, Estimated: >60 mL/min (ref >60)
Glucose, Bld: 157 mg/dL — ABNORMAL HIGH (ref 70–99)
Potassium: 3.6 mmol/L (ref 3.5–5.1)
Sodium: 136 mmol/L (ref 135–145)

## 2022-07-31 LAB — CBC
HCT: 25.9 % — ABNORMAL LOW (ref 36.0–46.0)
Hemoglobin: 7.7 g/dL — ABNORMAL LOW (ref 12.0–15.0)
MCH: 24.2 pg — ABNORMAL LOW (ref 26.0–34.0)
MCHC: 29.7 g/dL — ABNORMAL LOW (ref 30.0–36.0)
MCV: 81.4 fL (ref 80.0–100.0)
Platelets: 364 10*3/uL (ref 150–400)
RBC: 3.18 MIL/uL — ABNORMAL LOW (ref 3.87–5.11)
RDW: 16.9 % — ABNORMAL HIGH (ref 11.5–15.5)
WBC: 8.9 10*3/uL (ref 4.0–10.5)
nRBC: 0.2 % (ref 0.0–0.2)

## 2022-07-31 NOTE — Progress Notes (Signed)
PROGRESS NOTE    Elizabeth [name]  [id number] DOB: [birth year] DOA: 07/29/2022 PCP: Bunnie Pion, FNP   Assessment & Plan:   Principal Problem:   COPD exacerbation ([place]) Active Problems:   Chronic diastolic CHF (congestive heart failure) (HCC)   HTN (hypertension)   HLD (hyperlipidemia)   Constipation   Anxiety   Chronic pain syndrome  Assessment and Plan: COPD exacerbation: has dry cough, shortness of breath, wheezing, clinically consistent with COPD exacerbation. Still w/ significant wheezing today. CXR negative.  No oxygen desaturation. Continue on steroids, bronchodilators & encourage incentive spirometry   Chronic diastolic CHF: echo on [id number] showed EF 60-65% with grade 1 diastolic dysfunction. Has 3+ bilateral lower leg edema, but no pulmonary edema on chest x-ray, BNP is normal 68.8, clinically does not seem to have CHF exacerbation. Continue on home dose of aldactone and continue on increased dose of torsemide   HTN: continue on torsemide, aldactone, coreg. IV hydralazine prn    HLD: continue on statin    Constipation: likely secondary to chronic pain medication use. No obstruction noted on CT abd/pelvis. Continue on senokot, miralax & lactulose  Normocytic anemia: H&H are labile. Will transfuse if Hb < 7.0    Anxiety: severity unknown. Ativan prn    Chronic pain syndrome: continue on home dose of norco. Morphine prn   Obesity: BMI 32.6. Would benefit from weight loss   DVT prophylaxis: lovenox  Code Status: full  Family Communication:  Disposition Plan: likely d/c back home   Level of care: Telemetry Medical  Status is: Inpatient Remains inpatient appropriate because: severity of illness, still w/ significant wheezing     Consultants:    Procedures:   Antimicrobials:   Subjective: Pt c/o wheezing   Objective: Vitals:   07/31/22 0413 07/31/22 0500 07/31/22 0717 07/31/22 0727  BP: 106/62   (!) 110/58  Pulse: 77   80  Resp: 17    16  Temp: 98.3 F (36.8 C)   97.6 F (36.4 C)  TempSrc:    Oral  SpO2: 97%  97% 94%  Weight:  68.4 kg    Height:       No intake or output data in the 24 hours ending 07/31/22 0828  Filed Weights   07/30/22 1023 07/31/22 0500  Weight: 68 kg 68.4 kg    Examination:  General exam: Appears comfortable  Respiratory system: course breath sounds b/l. Wheezes b/l Cardiovascular system: S1/S2+. No rubs or gallops Gastrointestinal system: Abd is soft, NT, obese & hypoactive bowel sounds Central nervous system: alert and oriented. Moves all extremities  Psychiatry: judgement and insight appears normal. Flat mood and affect     Data Reviewed: I have personally reviewed following labs and imaging studies  CBC: Recent Labs  Lab 07/29/22 1035 07/31/22 0453  WBC 6.4 8.9  HGB 7.8* 7.7*  HCT 28.0* 25.9*  MCV 84.3 81.4  PLT 369 [id number]   Basic Metabolic Panel: Recent Labs  Lab 07/29/22 1035 07/30/22 0401 07/31/22 0453  NA 139 136 136  K 4.5 4.1 3.6  CL 106 101 97*  CO2 23 27 28   GLUCOSE 131* 132* 157*  BUN 24* 23 34*  CREATININE 0.82 0.77 0.80  CALCIUM 9.2 8.9 8.7*   GFR: Estimated Creatinine Clearance: 58.9 mL/min (by C-G formula based on SCr of 0.8 mg/dL). Liver Function Tests: No results for input(s): "AST", "ALT", "ALKPHOS", "BILITOT", "PROT", "ALBUMIN" in the last 168 hours. No results for input(s): "LIPASE", "AMYLASE" in the last 168 hours.  No results for input(s): "AMMONIA" in the last 168 hours. Coagulation Profile: No results for input(s): "INR", "PROTIME" in the last 168 hours. Cardiac Enzymes: No results for input(s): "CKTOTAL", "CKMB", "CKMBINDEX", "TROPONINI" in the last 168 hours. BNP (last 3 results) No results for input(s): "PROBNP" in the last 8760 hours. HbA1C: No results for input(s): "HGBA1C" in the last 72 hours. CBG: No results for input(s): "GLUCAP" in the last 168 hours. Lipid Profile: No results for input(s): "CHOL", "HDL", "LDLCALC", "TRIG",  "CHOLHDL", "LDLDIRECT" in the last 72 hours. Thyroid Function Tests: No results for input(s): "TSH", "T4TOTAL", "FREET4", "T3FREE", "THYROIDAB" in the last 72 hours. Anemia Panel: No results for input(s): "VITAMINB12", "FOLATE", "FERRITIN", "TIBC", "IRON", "RETICCTPCT" in the last 72 hours. Sepsis Labs: No results for input(s): "PROCALCITON", "LATICACIDVEN" in the last 168 hours.  Recent Results (from the past 240 hour(s))  Resp Panel by RT-PCR (Flu A&B, Covid) Anterior Nasal Swab     Status: None   Collection Time: 07/29/22  2:28 PM   Specimen: Anterior Nasal Swab  Result Value Ref Range Status   SARS Coronavirus 2 by RT PCR NEGATIVE NEGATIVE Final    Comment: (NOTE) SARS-CoV-2 target nucleic acids are NOT DETECTED.  The SARS-CoV-2 RNA is generally detectable in upper respiratory specimens during the acute phase of infection. The lowest concentration of SARS-CoV-2 viral copies this assay can detect is 138 copies/mL. A negative result does not preclude SARS-Cov-2 infection and should not be used as the sole basis for treatment or other patient management decisions. A negative result may occur with  improper specimen collection/handling, submission of specimen other than nasopharyngeal swab, presence of viral mutation(s) within the areas targeted by this assay, and inadequate number of viral copies(<138 copies/mL). A negative result must be combined with clinical observations, patient history, and epidemiological information. The expected result is Negative.  Fact Sheet for Patients:  EntrepreneurPulse.com.au  Fact Sheet for Healthcare Providers:  IncredibleEmployment.be  This test is no t yet approved or cleared by the [place] FDA and  has been authorized for detection and/or diagnosis of SARS-CoV-2 by FDA under an Emergency Use Authorization (EUA). This EUA will remain  in effect (meaning this test can be used) for the duration of  the COVID-19 declaration under Section 564(b)(1) of the Act, 21 U.S.C.section 360bbb-3(b)(1), unless the authorization is terminated  or revoked sooner.       Influenza A by PCR NEGATIVE NEGATIVE Final   Influenza B by PCR NEGATIVE NEGATIVE Final    Comment: (NOTE) The Xpert Xpress SARS-CoV-2/FLU/RSV plus assay is intended as an aid in the diagnosis of influenza from Nasopharyngeal swab specimens and should not be used as a sole basis for treatment. Nasal washings and aspirates are unacceptable for Xpert Xpress SARS-CoV-2/FLU/RSV testing.  Fact Sheet for Patients: EntrepreneurPulse.com.au  Fact Sheet for Healthcare Providers: IncredibleEmployment.be  This test is not yet approved or cleared by the [place] FDA and has been authorized for detection and/or diagnosis of SARS-CoV-2 by FDA under an Emergency Use Authorization (EUA). This EUA will remain in effect (meaning this test can be used) for the duration of the COVID-19 declaration under Section 564(b)(1) of the Act, 21 U.S.C. section 360bbb-3(b)(1), unless the authorization is terminated or revoked.  Performed at [facility], [address] [place], [place] [address]          Radiology Studies: US Venous Img Lower Bilateral (DVT)  Result Date: 07/29/2022 CLINICAL DATA:  Leg edema EXAM: BILATERAL LOWER EXTREMITY VENOUS DOPPLER ULTRASOUND  TECHNIQUE: Gray-scale sonography with graded compression, as well as color Doppler and duplex ultrasound were performed to evaluate the lower extremity deep venous systems from the level of the common femoral vein and including the common femoral, femoral, profunda femoral, popliteal and calf veins including the posterior tibial, peroneal and gastrocnemius veins when visible. The superficial great saphenous vein was also interrogated. Spectral Doppler was utilized to evaluate flow at rest and with distal augmentation maneuvers in the  common femoral, femoral and popliteal veins. COMPARISON:  03/08/2014, 08/15/2010 FINDINGS: RIGHT LOWER EXTREMITY Common Femoral Vein: No evidence of thrombus. Normal compressibility, respiratory phasicity and response to augmentation. Saphenofemoral Junction: No evidence of thrombus. Normal compressibility and flow on color Doppler imaging. Profunda Femoral Vein: No evidence of thrombus. Normal compressibility and flow on color Doppler imaging. Femoral Vein: No evidence of thrombus. Normal compressibility, respiratory phasicity and response to augmentation. Popliteal Vein: No evidence of thrombus. Normal compressibility, respiratory phasicity and response to augmentation. Calf Veins: No evidence of thrombus. Normal compressibility and flow on color Doppler imaging. Superficial Great Saphenous Vein: No evidence of thrombus. Normal compressibility. Venous Reflux:  None. Other Findings:  None. LEFT LOWER EXTREMITY Common Femoral Vein: No evidence of thrombus. Normal compressibility, respiratory phasicity and response to augmentation. Saphenofemoral Junction: No evidence of thrombus. Normal compressibility and flow on color Doppler imaging. Profunda Femoral Vein: No evidence of thrombus. Normal compressibility and flow on color Doppler imaging. Femoral Vein: No evidence of thrombus. Normal compressibility, respiratory phasicity and response to augmentation. Popliteal Vein: No evidence of thrombus. Normal compressibility, respiratory phasicity and response to augmentation. Calf Veins: No evidence of thrombus. Normal compressibility and flow on color Doppler imaging. Superficial Great Saphenous Vein: No evidence of thrombus. Normal compressibility. Venous Reflux:  None. Other Findings:  None. IMPRESSION: No evidence of deep venous thrombosis in either lower extremity. Electronically Signed   By: Davina Poke D.O.   On: 07/29/2022 16:24   CT ABDOMEN PELVIS W CONTRAST  Result Date: 07/29/2022 CLINICAL DATA:   Constipation, shortness of breath EXAM: CT ABDOMEN AND PELVIS WITH CONTRAST TECHNIQUE: Multidetector CT imaging of the abdomen and pelvis was performed using the standard protocol following bolus administration of intravenous contrast. RADIATION DOSE REDUCTION: This exam was performed according to the departmental dose-optimization program which includes automated exposure control, adjustment of the mA and/or kV according to patient size and/or use of iterative reconstruction technique. CONTRAST:  114mL OMNIPAQUE IOHEXOL 300 MG/ML  SOLN COMPARISON:  05/24/2018 FINDINGS: Lower chest: Small linear densities in the lower lung fields may suggest minimal scarring. Hepatobiliary: No focal abnormalities are seen in liver. There is slight prominence of intrahepatic bile ducts. There is high density in the dependent portion of gallbladder lumen. There is no wall thickening in gallbladder. There is no fluid around the gallbladder. Pancreas: No focal abnormalities are seen. Diverticulum is noted along the inner margin of second portion of duodenum. Spleen: Unremarkable. Adrenals/Urinary Tract: Adrenals are unremarkable. There is no significant hydronephrosis. Right renal pelvis is more prominent, possibly due to extrarenal location. Ureters are not dilated. There are multiple calcifications in both sides of pelvis, possibly vascular calcifications. Urinary bladder is unremarkable. Stomach/Bowel: Stomach is not distended. Small bowel loops are not dilated. Appendix is not dilated. There is no significant wall thickening in colon. There is no pericolic stranding. Vascular/Lymphatic: Arterial calcifications are seen. Reproductive: Possible calcified uterine fibroid is seen. Other: There is no ascites or pneumoperitoneum. There is an electronic device in subcutaneous plane in the left gluteal region with  lead extending towards the thoracic spine. Musculoskeletal: There is first-degree anterolisthesis at L4-L5 level. Degenerative  changes are noted in facet joints, more so at L4-L5 level. IMPRESSION: There is no evidence of intestinal obstruction or pneumoperitoneum. There is no hydronephrosis. High density in the dependent portion of gallbladder lumen suggests presence of sludge and possibly tiny stones. Lumbar spondylosis. Electronically Signed   By: Elmer Picker M.D.   On: 07/29/2022 14:00   DG Chest 2 View  Result Date: 07/29/2022 CLINICAL DATA:  Shortness of breath. Denies swelling. Audible wheezing. EXAM: CHEST - 2 VIEW COMPARISON:  AP chest 05/14/2022, 04/17/2021; CT chest 12/18/2020 FINDINGS: Cardiac silhouette is at the upper limits of normal size. Mediastinal contours are within normal limits with mild calcification within the aortic arch. Mild bilateral lower lung interstitial thickening is unchanged from prior, mild chronic scarring. No new focal airspace opacity to indicate pneumonia. No pulmonary edema, pleural effusion, or pneumothorax. Mild multilevel degenerative disc changes of the thoracic spine. A spinal cord stimulator electrode is again seen, with leads overlying the lower cervical spine. IMPRESSION: No active cardiopulmonary disease. Electronically Signed   By: Yvonne Kendall M.D.   On: 07/29/2022 10:59        Scheduled Meds:  atorvastatin  40 mg Oral QHS   carvedilol  3.125 mg Oral BID WC   enoxaparin (LOVENOX) injection  40 mg Subcutaneous Q24H   ipratropium-albuterol  3 mL Nebulization Q4H   lactulose  30 g Oral TID   methylPREDNISolone (SOLU-MEDROL) injection  80 mg Intravenous Q24H   mometasone-formoterol  2 puff Inhalation BID   pantoprazole  40 mg Oral Daily   polyethylene glycol  17 g Oral BID   senna-docusate  1 tablet Oral BID   spironolactone  25 mg Oral Daily   torsemide  40 mg Oral Daily   Continuous Infusions:   LOS: 2 days    Time spent: 37 mins     Wyvonnia Dusky, MD [facility] Pager [phone]  If 7PM-7AM, please contact  night-coverage [email or web address] 07/31/2022, 8:28 AM

## 2022-07-31 NOTE — TOC Initial Note (Signed)
Transition of Care [facility]) - Initial/Assessment Note    Patient Details  Name: Elizabeth [name] MRN: [id number] Date of Birth: Jun 19, 1961  Transition of Care [facility]) CM/SW Contact:    Laurena Slimmer, RN Phone Number: 07/31/2022, 12:56 PM  Clinical Narrative:                   Transition of Care (TOC) Screening Note   Patient Details  Name: Elizabeth [name] Date of Birth: July 30, 1961   Transition of Care [facility]) CM/SW Contact:    Laurena Slimmer, RN Phone Number: 07/31/2022, 12:57 PM    Transition of Care Department [facility]) has reviewed patient and no TOC needs have been identified at this time. We will continue to monitor patient advancement through interdisciplinary progression rounds. If new patient transition needs arise, please place a TOC consult.         Patient Goals and CMS Choice        Expected Discharge Plan and Services                                                Prior Living Arrangements/Services                       Activities of Daily Living Home Assistive Devices/Equipment: Nebulizer, Oxygen, BIPAP ADL Screening (condition at time of admission) Patient's cognitive ability adequate to safely complete daily activities?: Yes Is the patient deaf or have difficulty hearing?: No Does the patient have difficulty seeing, even when wearing glasses/contacts?: No Does the patient have difficulty concentrating, remembering, or making decisions?: No Patient able to express need for assistance with ADLs?: Yes Does the patient have difficulty dressing or bathing?: No Independently performs ADLs?: Yes (appropriate for developmental age) Does the patient have difficulty walking or climbing stairs?: No Weakness of Legs: None Weakness of Arms/Hands: None  Permission Sought/Granted                  Emotional Assessment              Admission diagnosis:  COPD exacerbation ([place]) [J44.1] Patient Active Problem List    Diagnosis Date Noted   COPD exacerbation ([place]) 07/29/2022   HLD (hyperlipidemia) 07/29/2022   Constipation 07/29/2022   Bilateral leg edema 07/29/2022   Anxiety 05/20/2022   Chronic, continuous use of opioids 05/15/2022   Elevated serum creatinine 05/14/2022   Chronic anemia 05/14/2022   COPD with acute exacerbation ([place]) 04/17/2021   Chronic use of opiate for therapeutic purpose 02/19/2021   History of tobacco abuse 01/03/2021   Complex regional pain syndrome type 1 of lower extremities (Bilateral) 11/20/2020   Complex regional pain syndrome type 1 of upper extremities (Bilateral) 05/16/2019   Pharmacologic therapy 05/16/2019   Disorder of skeletal system 05/16/2019   Problems influencing health status 05/16/2019   Palliative care by specialist 04/07/2019   Acute respiratory failure with hypoxia ([place]) 04/07/2019   HTN (hypertension) 08/24/2018   COPD (chronic obstructive pulmonary disease) ([place]) 08/24/2018   Lumbar spondylosis 08/22/2018   Disorder of bone, unspecified 08/30/2017   Opioid-induced constipation ([facility]) 09/15/2016   Lumbar facet syndrome (Bilateral) 03/18/2016   Insomnia secondary to chronic pain 12/16/2015   Chronic lower extremity pain (1ry area of Pain) (Bilateral) 09/23/2015   Long term prescription opiate use 09/23/2015   Goals of care, counseling/discussion  09/23/2015   Elevated sedimentation rate 09/23/2015   Chronic low back pain (Bilateral) w/o sciatica 07/25/2015   Generalized anxiety disorder 07/25/2015   Encounter for therapeutic drug level monitoring 07/25/2015   Long term current use of opiate analgesic [id number]   Uncomplicated opioid dependence ([place]) 07/25/2015   Opiate use (30 MME/Day) 07/25/2015   Chronic pain syndrome 07/25/2015   Opiate analgesic use agreement exists 07/25/2015   Presence of spinal cord stimulator 07/25/2015   Chronic diastolic CHF (congestive heart failure) ([place]) 07/25/2015   Vitamin D deficiency 07/25/2015   Vitamin B12  deficiency 07/25/2015   PCP:  Bunnie Pion, FNP Pharmacy:   [facility], [address] [place] [address] Phone: [phone] Fax: [phone]     Social Determinants of Health (SDOH) Interventions    Readmission Risk Interventions    05/16/2022   11:11 AM 04/22/2021   10:21 AM  Readmission Risk Prevention Plan  Transportation Screening Complete Complete  PCP or Specialist Appt within 3-5 Days Complete Complete  HRI or Home Care Consult  Complete  Social Work Consult for [place] Planning/Counseling Complete Complete  Palliative Care Screening Not Applicable Not Applicable  Medication Review Press photographer) Complete Referral to Pharmacy

## 2022-07-31 NOTE — TOC Initial Note (Signed)
Transition of Care [facility]) - Initial/Assessment Note    Patient Details  Name: Elizabeth [name] MRN: [id number] Date of Birth: Aug 31, 1961  Transition of Care [facility]) CM/SW Contact:    Laurena Slimmer, RN Phone Number: 07/31/2022, 3:41 PM  Clinical Narrative:                  Admitted for: COPD exacerbation Admitted from: Home with son PCP: Hendricks Milo  Pharmacy: [facility] Current home health/prior home health/DME:.Home oxygen BIPAP walker Transportation: Drives herself/ Son will transport home at discharge  Patient advised to have son bring her home oxygen at discharge.    Expected Discharge Plan: Home/Self Care Barriers to Discharge: Continued Medical Work up   Patient Goals and CMS Choice Patient states their goals for this hospitalization and ongoing recovery are:: To return home      Expected Discharge Plan and Services Expected Discharge Plan: Home/Self Care       Living arrangements for the past 2 months: Single Family Home                   DME Agency: [employer] (Has home oxygen)                  Prior Living Arrangements/Services Living arrangements for the past 2 months: Single Family Home Lives with:: Self, Adult Children Patient language and need for interpreter reviewed:: Yes Do you feel safe going back to the place where you live?: Yes      Need for Family Participation in Patient Care: Yes (Comment) Care giver support system in place?: Yes (comment)   Criminal Activity/Legal Involvement Pertinent to Current Situation/Hospitalization: No - Comment as needed  Activities of Daily Living Home Assistive Devices/Equipment: Nebulizer, Oxygen, BIPAP ADL Screening (condition at time of admission) Patient's cognitive ability adequate to safely complete daily activities?: Yes Is the patient deaf or have difficulty hearing?: No Does the patient have difficulty seeing, even when wearing glasses/contacts?: No Does the patient have difficulty  concentrating, remembering, or making decisions?: No Patient able to express need for assistance with ADLs?: Yes Does the patient have difficulty dressing or bathing?: No Independently performs ADLs?: Yes (appropriate for developmental age) Does the patient have difficulty walking or climbing stairs?: No Weakness of Legs: None Weakness of Arms/Hands: None  Permission Sought/Granted                  Emotional Assessment Appearance:: Appears stated age Attitude/Demeanor/Rapport: Gracious, Engaged Affect (typically observed): Accepting Orientation: : Oriented to Self, Oriented to Place, Oriented to  Time, Oriented to Situation Alcohol / Substance Use: Not Applicable Psych Involvement: No (comment)  Admission diagnosis:  COPD exacerbation ([place]) [J44.1] Patient Active Problem List   Diagnosis Date Noted   COPD exacerbation ([place]) 07/29/2022   HLD (hyperlipidemia) 07/29/2022   Constipation 07/29/2022   Bilateral leg edema 07/29/2022   Anxiety 05/20/2022   Chronic, continuous use of opioids 05/15/2022   Elevated serum creatinine 05/14/2022   Chronic anemia 05/14/2022   COPD with acute exacerbation ([place]) 04/17/2021   Chronic use of opiate for therapeutic purpose 02/19/2021   History of tobacco abuse 01/03/2021   Complex regional pain syndrome type 1 of lower extremities (Bilateral) 11/20/2020   Complex regional pain syndrome type 1 of upper extremities (Bilateral) 05/16/2019   Pharmacologic therapy 05/16/2019   Disorder of skeletal system 05/16/2019   Problems influencing health status 05/16/2019   Palliative care by specialist 04/07/2019   Acute respiratory failure with hypoxia ([place]) 04/07/2019  HTN (hypertension) 08/24/2018   COPD (chronic obstructive pulmonary disease) (HCC) 08/24/2018   Lumbar spondylosis 08/22/2018   Disorder of bone, unspecified 08/30/2017   Opioid-induced constipation ([facility]) 09/15/2016   Lumbar facet syndrome (Bilateral) 03/18/2016   Insomnia  secondary to chronic pain 12/16/2015   Chronic lower extremity pain (1ry area of Pain) (Bilateral) 09/23/2015   Long term prescription opiate use 09/23/2015   Goals of care, counseling/discussion 09/23/2015   Elevated sedimentation rate 09/23/2015   Chronic low back pain (Bilateral) w/o sciatica 07/25/2015   Generalized anxiety disorder 07/25/2015   Encounter for therapeutic drug level monitoring 07/25/2015   Long term current use of opiate analgesic 07/25/2015   Uncomplicated opioid dependence (HCC) 07/25/2015   Opiate use (30 MME/Day) 07/25/2015   Chronic pain syndrome 07/25/2015   Opiate analgesic use agreement exists 07/25/2015   Presence of spinal cord stimulator 07/25/2015   Chronic diastolic CHF (congestive heart failure) (HCC) 07/25/2015   Vitamin D deficiency 07/25/2015   Vitamin B12 deficiency 07/25/2015   PCP:  Lorn Junes, FNP Pharmacy:   [facility], [place] - [address] [place] [address] Phone: [phone] Fax: [phone]     Social Determinants of Health (SDOH) Interventions    Readmission Risk Interventions    07/31/2022    3:39 PM 05/16/2022   11:11 AM 04/22/2021   10:21 AM  Readmission Risk Prevention Plan  Transportation Screening Complete Complete Complete  PCP or Specialist Appt within 3-5 Days Complete Complete Complete  HRI or Home Care Consult Complete  Complete  Social Work Consult for Recovery Care Planning/Counseling Complete Complete Complete  Palliative Care Screening Not Applicable Not Applicable Not Applicable  Medication Review (RN Care Manager) Complete Complete Referral to Pharmacy

## 2022-08-01 DIAGNOSIS — J441 Chronic obstructive pulmonary disease with (acute) exacerbation: Secondary | ICD-10-CM | POA: Diagnosis not present

## 2022-08-01 DIAGNOSIS — E669 Obesity, unspecified: Secondary | ICD-10-CM | POA: Diagnosis not present

## 2022-08-01 DIAGNOSIS — I10 Essential (primary) hypertension: Secondary | ICD-10-CM | POA: Diagnosis not present

## 2022-08-01 LAB — CBC
HCT: 25.7 % — ABNORMAL LOW (ref 36.0–46.0)
Hemoglobin: 7.5 g/dL — ABNORMAL LOW (ref 12.0–15.0)
MCH: 23.7 pg — ABNORMAL LOW (ref 26.0–34.0)
MCHC: 29.2 g/dL — ABNORMAL LOW (ref 30.0–36.0)
MCV: 81.1 fL (ref 80.0–100.0)
Platelets: 353 10*3/uL (ref 150–400)
RBC: 3.17 MIL/uL — ABNORMAL LOW (ref 3.87–5.11)
RDW: 16.7 % — ABNORMAL HIGH (ref 11.5–15.5)
WBC: 9 10*3/uL (ref 4.0–10.5)
nRBC: 0 % (ref 0.0–0.2)

## 2022-08-01 LAB — BASIC METABOLIC PANEL
Anion gap: 8 (ref 5–15)
BUN: 32 mg/dL — ABNORMAL HIGH (ref 8–23)
CO2: 28 mmol/L (ref 22–32)
Calcium: 8.6 mg/dL — ABNORMAL LOW (ref 8.9–10.3)
Chloride: 102 mmol/L (ref 98–111)
Creatinine, Ser: 0.77 mg/dL (ref 0.44–1.00)
GFR, Estimated: >60 mL/min (ref >60)
Glucose, Bld: 131 mg/dL — ABNORMAL HIGH (ref 70–99)
Potassium: 3.9 mmol/L (ref 3.5–5.1)
Sodium: 138 mmol/L (ref 135–145)

## 2022-08-01 MED ORDER — OXYCODONE-ACETAMINOPHEN 5-325 MG PO TABS: 1.0000 | ORAL_TABLET | Freq: Four times a day (QID) | ORAL | Status: DC | PRN

## 2022-08-01 MED ORDER — IPRATROPIUM-ALBUTEROL 0.5-2.5 (3) MG/3ML IN SOLN
3.0000 mL | Freq: Four times a day (QID) | RESPIRATORY_TRACT | Status: DC
  Administered 2022-08-01 – 2022-08-02 (×4): 3 mL via RESPIRATORY_TRACT
  Filled 2022-08-01 (×4): qty 3

## 2022-08-01 NOTE — Progress Notes (Addendum)
Patient is alert and oriented x4. Refused stool softeners due to her already having multiple bowel movements. Patient has strong cough but refused mucinex that I offered her.Has ongoing back pain and states that the norco is not effective so she takes the morphine almost every 4 hours precisely. Patient stated that she had not slept but her eyes were closed every time I rounded on her. She denied additional needs.

## 2022-08-01 NOTE — Progress Notes (Signed)
PROGRESS NOTE    Elizabeth [name]  TMA:[id number] DOB: June 28, 1961 DOA: 07/29/2022 PCP: Bunnie Pion, FNP   Assessment & Plan:   Principal Problem:   COPD exacerbation ([place]) Active Problems:   Chronic diastolic CHF (congestive heart failure) (HCC)   HTN (hypertension)   HLD (hyperlipidemia)   Constipation   Anxiety   Chronic pain syndrome  Assessment and Plan: COPD exacerbation: has dry cough, shortness of breath, wheezing, clinically consistent with COPD exacerbation. CXR was neg. Wheezing present still but improved from day prior. Continue on steroids, bronchodilators & encourage incentive spirometry   Chronic diastolic CHF: echo on 11/17/6387 showed EF 60-65% with grade 1 diastolic dysfunction. CHF appears compensated. Continue on home dose of aldactone and continue on increased dose of torsemide   HTN: continue on coreg, torsemide, aldactone. IV hydralazine prn    HLD: continue on statin    Constipation: likely secondary to chronic narcotic use. No obstruction noted on CT abd/pelvis. Resolved   Normocytic anemia: H&H are trending down slightly. Will transfuse if Hb < 7.0    Anxiety: severity unknown. Ativan prn    Chronic pain syndrome: norco d/c and percocet started. Morphine prn    Obesity: BMI 33.9. Would benefit from weight loss   DVT prophylaxis: lovenox  Code Status: full  Family Communication:  Disposition Plan: likely d/c back home   Level of care: Telemetry Medical  Status is: Inpatient Remains inpatient appropriate because: severity of illness, wheezing improved, likely d/c home tomorrow     Consultants:    Procedures:   Antimicrobials:   Subjective: Pt c/o malaise   Objective: Vitals:   08/01/22 0500 08/01/22 0525 08/01/22 0723 08/01/22 0812  BP:  119/63  107/67  Pulse:  73 73 69  Resp:  18 18 20   Temp:  98.2 F (36.8 C)  98.2 F (36.8 C)  TempSrc:  Oral  Oral  SpO2:  96% 97% 92%  Weight: 71.2 kg     Height:         Intake/Output Summary (Last 24 hours) at 08/01/2022 0817 Last data filed at 08/01/2022 0527 Gross per 24 hour  Intake --  Output 2550 ml  Net -2550 ml    Filed Weights   07/30/22 1023 07/31/22 0500 08/01/22 0500  Weight: 68 kg 68.4 kg 71.2 kg    Examination:  General exam: Appears calm & comfortable  Respiratory system: diminished breath sounds b/l. Wheezes b/l  Cardiovascular system: S1 & S2+. No rubs or clicks  Gastrointestinal system: Abd is soft, NT, obese & normal bowel sounds  Central nervous system: Alert and oriented. Moves all extremities  Psychiatry: judgement and insight appears normal. Flat mood and affect     Data Reviewed: I have personally reviewed following labs and imaging studies  CBC: Recent Labs  Lab 07/29/22 1035 07/31/22 0453 08/01/22 0508  WBC 6.4 8.9 9.0  HGB 7.8* 7.7* 7.5*  HCT 28.0* 25.9* 25.7*  MCV 84.3 81.4 81.1  PLT 369 364 373   Basic Metabolic Panel: Recent Labs  Lab 07/29/22 1035 07/30/22 0401 07/31/22 0453 08/01/22 0508  NA 139 136 136 138  K 4.5 4.1 3.6 3.9  CL 106 101 97* 102  CO2 23 27 28 28   GLUCOSE 131* 132* 157* 131*  BUN 24* 23 34* 32*  CREATININE 0.82 0.77 0.80 0.77  CALCIUM 9.2 8.9 8.7* 8.6*   GFR: Estimated Creatinine Clearance: 60.2 mL/min (by C-G formula based on SCr of 0.77 mg/dL). Liver Function Tests: No results  for input(s): "AST", "ALT", "ALKPHOS", "BILITOT", "PROT", "ALBUMIN" in the last 168 hours. No results for input(s): "LIPASE", "AMYLASE" in the last 168 hours. No results for input(s): "AMMONIA" in the last 168 hours. Coagulation Profile: No results for input(s): "INR", "PROTIME" in the last 168 hours. Cardiac Enzymes: No results for input(s): "CKTOTAL", "CKMB", "CKMBINDEX", "TROPONINI" in the last 168 hours. BNP (last 3 results) No results for input(s): "PROBNP" in the last 8760 hours. HbA1C: No results for input(s): "HGBA1C" in the last 72 hours. CBG: No results for input(s): "GLUCAP"  in the last 168 hours. Lipid Profile: No results for input(s): "CHOL", "HDL", "LDLCALC", "TRIG", "CHOLHDL", "LDLDIRECT" in the last 72 hours. Thyroid Function Tests: No results for input(s): "TSH", "T4TOTAL", "FREET4", "T3FREE", "THYROIDAB" in the last 72 hours. Anemia Panel: No results for input(s): "VITAMINB12", "FOLATE", "FERRITIN", "TIBC", "IRON", "RETICCTPCT" in the last 72 hours. Sepsis Labs: No results for input(s): "PROCALCITON", "LATICACIDVEN" in the last 168 hours.  Recent Results (from the past 240 hour(s))  Resp Panel by RT-PCR (Flu A&B, Covid) Anterior Nasal Swab     Status: None   Collection Time: 07/29/22  2:28 PM   Specimen: Anterior Nasal Swab  Result Value Ref Range Status   SARS Coronavirus 2 by RT PCR NEGATIVE NEGATIVE Final    Comment: (NOTE) SARS-CoV-2 target nucleic acids are NOT DETECTED.  The SARS-CoV-2 RNA is generally detectable in upper respiratory specimens during the acute phase of infection. The lowest concentration of SARS-CoV-2 viral copies this assay can detect is 138 copies/mL. A negative result does not preclude SARS-Cov-2 infection and should not be used as the sole basis for treatment or other patient management decisions. A negative result may occur with  improper specimen collection/handling, submission of specimen other than nasopharyngeal swab, presence of viral mutation(s) within the areas targeted by this assay, and inadequate number of viral copies(<138 copies/mL). A negative result must be combined with clinical observations, patient history, and epidemiological information. The expected result is Negative.  Fact Sheet for Patients:  EntrepreneurPulse.com.au  Fact Sheet for Healthcare Providers:  IncredibleEmployment.be  This test is no t yet approved or cleared by the [place] FDA and  has been authorized for detection and/or diagnosis of SARS-CoV-2 by FDA under an Emergency Use  Authorization (EUA). This EUA will remain  in effect (meaning this test can be used) for the duration of the COVID-19 declaration under Section 564(b)(1) of the Act, 21 U.S.C.section 360bbb-3(b)(1), unless the authorization is terminated  or revoked sooner.       Influenza A by PCR NEGATIVE NEGATIVE Final   Influenza B by PCR NEGATIVE NEGATIVE Final    Comment: (NOTE) The Xpert Xpress SARS-CoV-2/FLU/RSV plus assay is intended as an aid in the diagnosis of influenza from Nasopharyngeal swab specimens and should not be used as a sole basis for treatment. Nasal washings and aspirates are unacceptable for Xpert Xpress SARS-CoV-2/FLU/RSV testing.  Fact Sheet for Patients: EntrepreneurPulse.com.au  Fact Sheet for Healthcare Providers: IncredibleEmployment.be  This test is not yet approved or cleared by the [place] FDA and has been authorized for detection and/or diagnosis of SARS-CoV-2 by FDA under an Emergency Use Authorization (EUA). This EUA will remain in effect (meaning this test can be used) for the duration of the COVID-19 declaration under Section 564(b)(1) of the Act, 21 U.S.C. section 360bbb-3(b)(1), unless the authorization is terminated or revoked.  Performed at [facility], [address], [place], [place] [address]  Radiology Studies: No results found.      Scheduled Meds:  atorvastatin  40 mg Oral QHS   carvedilol  3.125 mg Oral BID WC   enoxaparin (LOVENOX) injection  40 mg Subcutaneous Q24H   ipratropium-albuterol  3 mL Nebulization Q4H   methylPREDNISolone (SOLU-MEDROL) injection  80 mg Intravenous Q24H   mometasone-formoterol  2 puff Inhalation BID   pantoprazole  40 mg Oral Daily   polyethylene glycol  17 g Oral BID   senna-docusate  1 tablet Oral BID   spironolactone  25 mg Oral Daily   torsemide  40 mg Oral Daily   Continuous Infusions:   LOS: 3 days    Time spent: 30 mins      Wyvonnia Dusky, MD [facility] Pager [phone]  If 7PM-7AM, please contact night-coverage [email or web address] 08/01/2022, 8:17 AM

## 2022-08-02 DIAGNOSIS — I5032 Chronic diastolic (congestive) heart failure: Secondary | ICD-10-CM | POA: Diagnosis not present

## 2022-08-02 DIAGNOSIS — E669 Obesity, unspecified: Secondary | ICD-10-CM | POA: Diagnosis not present

## 2022-08-02 DIAGNOSIS — J441 Chronic obstructive pulmonary disease with (acute) exacerbation: Secondary | ICD-10-CM | POA: Diagnosis not present

## 2022-08-02 LAB — CBC
HCT: 26.8 % — ABNORMAL LOW (ref 36.0–46.0)
Hemoglobin: 8.1 g/dL — ABNORMAL LOW (ref 12.0–15.0)
MCH: 24.3 pg — ABNORMAL LOW (ref 26.0–34.0)
MCHC: 30.2 g/dL (ref 30.0–36.0)
MCV: 80.2 fL (ref 80.0–100.0)
Platelets: 382 10*3/uL (ref 150–400)
RBC: 3.34 MIL/uL — ABNORMAL LOW (ref 3.87–5.11)
RDW: 16.7 % — ABNORMAL HIGH (ref 11.5–15.5)
WBC: 9.4 10*3/uL (ref 4.0–10.5)
nRBC: 0.3 % — ABNORMAL HIGH (ref 0.0–0.2)

## 2022-08-02 LAB — BASIC METABOLIC PANEL
Anion gap: 7 (ref 5–15)
BUN: 34 mg/dL — ABNORMAL HIGH (ref 8–23)
CO2: 29 mmol/L (ref 22–32)
Calcium: 8.7 mg/dL — ABNORMAL LOW (ref 8.9–10.3)
Chloride: 102 mmol/L (ref 98–111)
Creatinine, Ser: 0.79 mg/dL (ref 0.44–1.00)
GFR, Estimated: >60 mL/min (ref >60)
Glucose, Bld: 122 mg/dL — ABNORMAL HIGH (ref 70–99)
Potassium: 3.8 mmol/L (ref 3.5–5.1)
Sodium: 138 mmol/L (ref 135–145)

## 2022-08-02 MED ORDER — PREDNISONE 10 MG PO TABS: ORAL_TABLET | ORAL | 0 refills | Status: DC

## 2022-08-02 MED ORDER — ALBUTEROL SULFATE (2.5 MG/3ML) 0.083% IN NEBU: 2.5000 mg | INHALATION_SOLUTION | RESPIRATORY_TRACT | 0 refills | Status: AC | PRN

## 2022-08-02 NOTE — Plan of Care (Signed)
IV removed, discharge instructions reviewed, patient getting dressed and will call her son for a ride home

## 2022-08-02 NOTE — TOC Progression Note (Signed)
Transition of Care [facility]) - Progression Note    Patient Details  Name: Elizabeth [name] MRN: [id number] Date of Birth: August 25, 1961  Transition of Care [facility]) CM/SW [place], LCSW Phone Number: 08/02/2022, 10:09 AM  Clinical Narrative:  CSW acknowledges heart failure consult. Sent secure chat to heart failure nurse navigator to notify.   Expected Discharge Plan: Home/Self Care Barriers to Discharge: Continued Medical Work up  Expected Discharge Plan and Services Expected Discharge Plan: Home/Self Care       Living arrangements for the past 2 months: Single Family Home                   DME Agency: [employer] (Has home oxygen)                   Social Determinants of Health (SDOH) Interventions    Readmission Risk Interventions    07/31/2022    3:39 PM 05/16/2022   11:11 AM 04/22/2021   10:21 AM  Readmission Risk Prevention Plan  Transportation Screening Complete Complete Complete  PCP or Specialist Appt within 3-5 Days Complete Complete Complete  HRI or Home Care Consult Complete  Complete  Social Work Consult for [place] Planning/Counseling Complete Complete Complete  Palliative Care Screening Not Applicable Not Applicable Not Applicable  Medication Review Press photographer) Complete Complete Referral to Pharmacy

## 2022-08-02 NOTE — TOC Transition Note (Signed)
Transition of Care [facility]) - CM/SW Discharge Note   Patient Details  Name: Elizabeth [name] MRN: [id number] Date of Birth: 1960/11/28  Transition of Care [facility]) CM/SW Contact:  Rebekah [place], LCSW Phone Number: 08/02/2022, 11:41 AM   Clinical Narrative:   Patient has orders to discharge home today. No further concerns. CSW signing off.    Final next level of care: Home/Self Care Barriers to Discharge: Barriers Resolved   Patient Goals and CMS Choice Patient states their goals for this hospitalization and ongoing recovery are:: To return home      Discharge Placement                Patient to be transferred to facility by: Son   Patient and family notified of of transfer: 08/02/22  Discharge Plan and Services                  DME Agency: [employer] (Has home oxygen)                  Social Determinants of Health (SDOH) Interventions     Readmission Risk Interventions    07/31/2022    3:39 PM 05/16/2022   11:11 AM 04/22/2021   10:21 AM  Readmission Risk Prevention Plan  Transportation Screening Complete Complete Complete  PCP or Specialist Appt within 3-5 Days Complete Complete Complete  HRI or Home Care Consult Complete  Complete  Social Work Consult for [place] Planning/Counseling Complete Complete Complete  Palliative Care Screening Not Applicable Not Applicable Not Applicable  Medication Review Press photographer) Complete Complete Referral to Pharmacy

## 2022-08-02 NOTE — Discharge Summary (Signed)
Physician Discharge Summary  Elizabeth [name] MPN:[id number] DOB: [birth year] DOA: 07/29/2022  PCP: Bunnie Pion, FNP  Admit date: 07/29/2022 Discharge date: 08/02/2022  Admitted From: home  Disposition:  home   Recommendations for Outpatient Follow-up:  Follow up with PCP in 1-2 weeks F/u w/ pulmon in 1-2 weeks  Home Health: no  Equipment/Devices:   Discharge Condition: stable CODE STATUS: full  Diet recommendation: Heart Healthy   Brief/Interim Summary: HPI was taken from Dr. Blaine Hamper: Elizabeth [name] is a 61 y.o. female with medical history significant of dCHF, COPD, hypertension, hyperlipidemia, GERD, anxiety, reflux sympathetic dystrophy, chronic pain syndrome, chronic back pain, s/p of spinal cord stimulator placement, former smoker, who presents with shortness breath.   Patient states that he is short of breath for more than 3 days, which has been progressively worsening.  Patient has mild dry cough and loud wheezing. No chest pain.  No fever or chills. She reports that her leg edema has been progressively worsening recently.  Patient is constipated for almost 1 month.  She took MiraLAX and Colace without improvement. No nausea or vomiting.  Patient reports diffuse abdominal pain, which is mild to moderate, aching, nonradiating.  No symptoms of UTI.   Data reviewed independently and ED Course: pt was found to have BNP 68.8, WBC 6.4, troponin level 4, GFR> 60, temperature normal, blood pressure 122/92, heart rate 89, RR 26, 16, oxygen saturation 100% on room air.  Chest x-ray negative.  CT abdomen/pelvis negative for acute intra-abdominal issues except for possible tiny gallstone.  Patient is admitted to telemetry bed as inpatient.   CT-abd/pelvis: There is no evidence of intestinal obstruction or pneumoperitoneum. There is no hydronephrosis. High density in the dependent portion of gallbladder lumen suggests presence of sludge and possibly tiny stones.   Lumbar  spondylosis.   As per Dr. Jimmye Norman 10/19-10/22/23: Pt was found to have COPD exacerbation and was treated w/ steroids, bronchodilators & incentive spirometry. Pt was d/c home w/ steroid taper to complete the course. Of note, while inpatient, pt's home dose of torsemide was increased from $RemoveBefore'20mg'gXAQYctWnhfwG$  to $R'40mg'Ky$  secondary to worsening leg edema. At d/c the pt's home dose of torsemide was continue ($RemoveBefor'20mg'DpcdVUeVCUPw$  daily).    Discharge Diagnoses:  Principal Problem:   COPD exacerbation ([place]) Active Problems:   Chronic diastolic CHF (congestive heart failure) ([facility])   HTN (hypertension)   HLD (hyperlipidemia)   Constipation   Anxiety   Chronic pain syndrome  COPD exacerbation: has dry cough, shortness of breath, wheezing, clinically consistent with COPD exacerbation. CXR was neg. Wheezing present still but improved from day prior. Continue bronchodilators , encourage incentive spirometry & will start a steroid taper at d/c    Chronic diastolic CHF: echo on 0/0/8676 showed EF 60-65% with grade 1 diastolic dysfunction. CHF appears compensated. Continue on home dose of aldactone, torsemide at d/c    HTN: continue on coreg, torsemide, aldactone. IV hydralazine prn    HLD: continue on statin    Constipation: likely secondary to chronic narcotic use. No obstruction noted on CT abd/pelvis. Resolved    Normocytic anemia: H&H are trending down slightly. Will transfuse if Hb < 7.0    Anxiety: severity unknown. Ativan prn    Chronic pain syndrome: restart home dose of norco at d/c.     Obesity: BMI 33.9. Would benefit from weight loss  Discharge Instructions  Discharge Instructions     Diet - low sodium heart healthy   Complete by: As directed  Discharge instructions   Complete by: As directed    F/u w/ PCP in 1-2 weeks. F/u w/ pulmon in 1-2 weeks   Increase activity slowly   Complete by: As directed       Allergies as of 08/02/2022       Reactions   Lisinopril Anaphylaxis        Medication List      TAKE these medications    albuterol 108 (90 Base) MCG/ACT inhaler Commonly known as: VENTOLIN HFA Inhale 2 puffs into the lungs every 6 (six) hours as needed for wheezing or shortness of breath.   albuterol (2.5 MG/3ML) 0.083% nebulizer solution Commonly known as: PROVENTIL Take 3 mLs (2.5 mg total) by nebulization every 4 (four) hours as needed for shortness of breath or wheezing.   atorvastatin 40 MG tablet Commonly known as: LIPITOR Take 1 tablet (40 mg total) by mouth at bedtime.   carvedilol 3.125 MG tablet Commonly known as: COREG Take 1 tablet (3.125 mg total) by mouth 2 (two) times daily with a meal.   HYDROcodone-acetaminophen 5-325 MG tablet Commonly known as: NORCO/VICODIN Take 1 tablet by mouth every 6 (six) hours as needed for severe pain. Must last 30 days. Start taking on: August 23, 2022 What changed: Another medication with the same name was removed. Continue taking this medication, and follow the directions you see here.   mometasone-formoterol 100-5 MCG/ACT Aero Commonly known as: DULERA Inhale 2 puffs into the lungs 2 (two) times daily.   naloxone 4 MG/0.1ML Liqd nasal spray kit Commonly known as: NARCAN Place 1 spray into the nose as needed for up to 365 doses (for opioid-induced respiratory depresssion). In case of emergency (overdose), spray once into each nostril. If no response within 3 minutes, repeat application and call [phone].   pantoprazole 40 MG tablet Commonly known as: PROTONIX Take 40 mg by mouth daily.   potassium chloride SA 20 MEQ tablet Commonly known as: KLOR-CON M Take 20 mEq by mouth daily.   predniSONE 10 MG tablet Commonly known as: DELTASONE $RemoveBefor'60mg'YvGvDgPPtmqA$  x 2 days, $Remov'50mg'rxwWEe$  daily x 2 days, $Remov'40mg'QEcmqV$  daily x 2 days, $Remov'30mg'AMKnSM$  daily x 2 days, $Remov'20mg'EFAoYh$  daily x 2 days, $Remov'10mg'cBXJCW$  daily x 2 days then stop   spironolactone 25 MG tablet Commonly known as: ALDACTONE Take 25 mg by mouth daily.   torsemide 20 MG tablet Commonly known as: DEMADEX Take 1 tablet  (20 mg total) by mouth daily.        Allergies  Allergen Reactions   Lisinopril Anaphylaxis    Consultations:   Procedures/Studies: US Venous Img Lower Bilateral (DVT)  Result Date: 07/29/2022 CLINICAL DATA:  Leg edema EXAM: BILATERAL LOWER EXTREMITY VENOUS DOPPLER ULTRASOUND TECHNIQUE: Gray-scale sonography with graded compression, as well as color Doppler and duplex ultrasound were performed to evaluate the lower extremity deep venous systems from the level of the common femoral vein and including the common femoral, femoral, profunda femoral, popliteal and calf veins including the posterior tibial, peroneal and gastrocnemius veins when visible. The superficial great saphenous vein was also interrogated. Spectral Doppler was utilized to evaluate flow at rest and with distal augmentation maneuvers in the common femoral, femoral and popliteal veins. COMPARISON:  03/08/2014, 08/15/2010 FINDINGS: RIGHT LOWER EXTREMITY Common Femoral Vein: No evidence of thrombus. Normal compressibility, respiratory phasicity and response to augmentation. Saphenofemoral Junction: No evidence of thrombus. Normal compressibility and flow on color Doppler imaging. Profunda Femoral Vein: No evidence of thrombus. Normal compressibility and flow on color Doppler imaging. Femoral  Vein: No evidence of thrombus. Normal compressibility, respiratory phasicity and response to augmentation. Popliteal Vein: No evidence of thrombus. Normal compressibility, respiratory phasicity and response to augmentation. Calf Veins: No evidence of thrombus. Normal compressibility and flow on color Doppler imaging. Superficial Great Saphenous Vein: No evidence of thrombus. Normal compressibility. Venous Reflux:  None. Other Findings:  None. LEFT LOWER EXTREMITY Common Femoral Vein: No evidence of thrombus. Normal compressibility, respiratory phasicity and response to augmentation. Saphenofemoral Junction: No evidence of thrombus. Normal  compressibility and flow on color Doppler imaging. Profunda Femoral Vein: No evidence of thrombus. Normal compressibility and flow on color Doppler imaging. Femoral Vein: No evidence of thrombus. Normal compressibility, respiratory phasicity and response to augmentation. Popliteal Vein: No evidence of thrombus. Normal compressibility, respiratory phasicity and response to augmentation. Calf Veins: No evidence of thrombus. Normal compressibility and flow on color Doppler imaging. Superficial Great Saphenous Vein: No evidence of thrombus. Normal compressibility. Venous Reflux:  None. Other Findings:  None. IMPRESSION: No evidence of deep venous thrombosis in either lower extremity. Electronically Signed   By: Davina Poke D.O.   On: 07/29/2022 16:24   CT ABDOMEN PELVIS W CONTRAST  Result Date: 07/29/2022 CLINICAL DATA:  Constipation, shortness of breath EXAM: CT ABDOMEN AND PELVIS WITH CONTRAST TECHNIQUE: Multidetector CT imaging of the abdomen and pelvis was performed using the standard protocol following bolus administration of intravenous contrast. RADIATION DOSE REDUCTION: This exam was performed according to the departmental dose-optimization program which includes automated exposure control, adjustment of the mA and/or kV according to patient size and/or use of iterative reconstruction technique. CONTRAST:  131mL OMNIPAQUE IOHEXOL 300 MG/ML  SOLN COMPARISON:  05/24/2018 FINDINGS: Lower chest: Small linear densities in the lower lung fields may suggest minimal scarring. Hepatobiliary: No focal abnormalities are seen in liver. There is slight prominence of intrahepatic bile ducts. There is high density in the dependent portion of gallbladder lumen. There is no wall thickening in gallbladder. There is no fluid around the gallbladder. Pancreas: No focal abnormalities are seen. Diverticulum is noted along the inner margin of second portion of duodenum. Spleen: Unremarkable. Adrenals/Urinary Tract: Adrenals  are unremarkable. There is no significant hydronephrosis. Right renal pelvis is more prominent, possibly due to extrarenal location. Ureters are not dilated. There are multiple calcifications in both sides of pelvis, possibly vascular calcifications. Urinary bladder is unremarkable. Stomach/Bowel: Stomach is not distended. Small bowel loops are not dilated. Appendix is not dilated. There is no significant wall thickening in colon. There is no pericolic stranding. Vascular/Lymphatic: Arterial calcifications are seen. Reproductive: Possible calcified uterine fibroid is seen. Other: There is no ascites or pneumoperitoneum. There is an electronic device in subcutaneous plane in the left gluteal region with lead extending towards the thoracic spine. Musculoskeletal: There is first-degree anterolisthesis at L4-L5 level. Degenerative changes are noted in facet joints, more so at L4-L5 level. IMPRESSION: There is no evidence of intestinal obstruction or pneumoperitoneum. There is no hydronephrosis. High density in the dependent portion of gallbladder lumen suggests presence of sludge and possibly tiny stones. Lumbar spondylosis. Electronically Signed   By: Elmer Picker M.D.   On: 07/29/2022 14:00   DG Chest 2 View  Result Date: 07/29/2022 CLINICAL DATA:  Shortness of breath. Denies swelling. Audible wheezing. EXAM: CHEST - 2 VIEW COMPARISON:  AP chest 05/14/2022, 04/17/2021; CT chest 12/18/2020 FINDINGS: Cardiac silhouette is at the upper limits of normal size. Mediastinal contours are within normal limits with mild calcification within the aortic arch. Mild bilateral lower lung interstitial thickening  is unchanged from prior, mild chronic scarring. No new focal airspace opacity to indicate pneumonia. No pulmonary edema, pleural effusion, or pneumothorax. Mild multilevel degenerative disc changes of the thoracic spine. A spinal cord stimulator electrode is again seen, with leads overlying the lower cervical  spine. IMPRESSION: No active cardiopulmonary disease. Electronically Signed   By: Yvonne Kendall M.D.   On: 07/29/2022 10:59   (Echo, Carotid, EGD, Colonoscopy, ERCP)    Subjective: Pt c/o fatigue    Discharge Exam: Vitals:   08/02/22 0728 08/02/22 0730  BP: 114/72   Pulse: 73 70  Resp: 16 16  Temp: 98.3 F (36.8 C)   SpO2: 96% 96%   Vitals:   08/02/22 0231 08/02/22 0349 08/02/22 0728 08/02/22 0730  BP:  127/68 114/72   Pulse:  70 73 70  Resp:  $Remo'20 16 16  'ffYyt$ Temp:  98.1 F (36.7 C) 98.3 F (36.8 C)   TempSrc:  Oral Oral   SpO2: 92% 96% 96% 96%  Weight:      Height:        General: Pt is alert, awake, not in acute distress Cardiovascular:  S1/S2 +, no rubs, no gallops Respiratory: decreased breath sounds b/l  Abdominal: Soft, NT, obese, bowel sounds + Extremities: no cyanosis    The results of significant diagnostics from this hospitalization (including imaging, microbiology, ancillary and laboratory) are listed below for reference.     Microbiology: Recent Results (from the past 240 hour(s))  Resp Panel by RT-PCR (Flu A&B, Covid) Anterior Nasal Swab     Status: None   Collection Time: 07/29/22  2:28 PM   Specimen: Anterior Nasal Swab  Result Value Ref Range Status   SARS Coronavirus 2 by RT PCR NEGATIVE NEGATIVE Final    Comment: (NOTE) SARS-CoV-2 target nucleic acids are NOT DETECTED.  The SARS-CoV-2 RNA is generally detectable in upper respiratory specimens during the acute phase of infection. The lowest concentration of SARS-CoV-2 viral copies this assay can detect is 138 copies/mL. A negative result does not preclude SARS-Cov-2 infection and should not be used as the sole basis for treatment or other patient management decisions. A negative result may occur with  improper specimen collection/handling, submission of specimen other than nasopharyngeal swab, presence of viral mutation(s) within the areas targeted by this assay, and inadequate number of  viral copies(<138 copies/mL). A negative result must be combined with clinical observations, patient history, and epidemiological information. The expected result is Negative.  Fact Sheet for Patients:  EntrepreneurPulse.com.au  Fact Sheet for Healthcare Providers:  IncredibleEmployment.be  This test is no t yet approved or cleared by the [place] FDA and  has been authorized for detection and/or diagnosis of SARS-CoV-2 by FDA under an Emergency Use Authorization (EUA). This EUA will remain  in effect (meaning this test can be used) for the duration of the COVID-19 declaration under Section 564(b)(1) of the Act, 21 U.S.C.section 360bbb-3(b)(1), unless the authorization is terminated  or revoked sooner.       Influenza A by PCR NEGATIVE NEGATIVE Final   Influenza B by PCR NEGATIVE NEGATIVE Final    Comment: (NOTE) The Xpert Xpress SARS-CoV-2/FLU/RSV plus assay is intended as an aid in the diagnosis of influenza from Nasopharyngeal swab specimens and should not be used as a sole basis for treatment. Nasal washings and aspirates are unacceptable for Xpert Xpress SARS-CoV-2/FLU/RSV testing.  Fact Sheet for Patients: EntrepreneurPulse.com.au  Fact Sheet for Healthcare Providers: IncredibleEmployment.be  This test is not yet approved or cleared  by the [place] and has been authorized for detection and/or diagnosis of SARS-CoV-2 by FDA under an Emergency Use Authorization (EUA). This EUA will remain in effect (meaning this test can be used) for the duration of the COVID-19 declaration under Section 564(b)(1) of the Act, 21 U.S.C. section 360bbb-3(b)(1), unless the authorization is terminated or revoked.  Performed at [facility], [address] [place], [place] [address]      Labs: BNP (last 3 results) Recent Labs    05/14/22 1842 07/29/22 1035  BNP 29.9 71.0   Basic  Metabolic Panel: Recent Labs  Lab 07/29/22 1035 07/30/22 0401 07/31/22 0453 08/01/22 0508 08/02/22 0455  NA 139 136 136 138 138  K 4.5 4.1 3.6 3.9 3.8  CL 106 101 97* 102 102  CO2 $Re'23 27 28 28 29  'xIS$ GLUCOSE 131* 132* 157* 131* 122*  BUN 24* 23 34* 32* 34*  CREATININE 0.82 0.77 0.80 0.77 0.79  CALCIUM 9.2 8.9 8.7* 8.6* 8.7*   Liver Function Tests: No results for input(s): "AST", "ALT", "ALKPHOS", "BILITOT", "PROT", "ALBUMIN" in the last 168 hours. No results for input(s): "LIPASE", "AMYLASE" in the last 168 hours. No results for input(s): "AMMONIA" in the last 168 hours. CBC: Recent Labs  Lab 07/29/22 1035 07/31/22 0453 08/01/22 0508 08/02/22 0455  WBC 6.4 8.9 9.0 9.4  HGB 7.8* 7.7* 7.5* 8.1*  HCT 28.0* 25.9* 25.7* 26.8*  MCV 84.3 81.4 81.1 80.2  PLT 369 364 353 382   Cardiac Enzymes: No results for input(s): "CKTOTAL", "CKMB", "CKMBINDEX", "TROPONINI" in the last 168 hours. BNP: Invalid input(s): "POCBNP" CBG: No results for input(s): "GLUCAP" in the last 168 hours. D-Dimer No results for input(s): "DDIMER" in the last 72 hours. Hgb A1c No results for input(s): "HGBA1C" in the last 72 hours. Lipid Profile No results for input(s): "CHOL", "HDL", "LDLCALC", "TRIG", "CHOLHDL", "LDLDIRECT" in the last 72 hours. Thyroid function studies No results for input(s): "TSH", "T4TOTAL", "T3FREE", "THYROIDAB" in the last 72 hours.  Invalid input(s): "FREET3" Anemia work up No results for input(s): "VITAMINB12", "FOLATE", "FERRITIN", "TIBC", "IRON", "RETICCTPCT" in the last 72 hours. Urinalysis    Component Value Date/Time   COLORURINE STRAW (A) 05/24/2018 1150   APPEARANCEUR CLEAR (A) 05/24/2018 1150   LABSPEC 1.005 05/24/2018 1150   PHURINE 5.0 05/24/2018 1150   GLUCOSEU NEGATIVE 05/24/2018 1150   HGBUR NEGATIVE 05/24/2018 [place] 05/24/2018 1150   KETONESUR NEGATIVE 05/24/2018 1150   PROTEINUR NEGATIVE 05/24/2018 1150   NITRITE NEGATIVE 05/24/2018  1150   LEUKOCYTESUR TRACE (A) 05/24/2018 1150   Sepsis Labs Recent Labs  Lab 07/29/22 1035 07/31/22 0453 08/01/22 0508 08/02/22 0455  WBC 6.4 8.9 9.0 9.4   Microbiology Recent Results (from the past 240 hour(s))  Resp Panel by RT-PCR (Flu A&B, Covid) Anterior Nasal Swab     Status: None   Collection Time: 07/29/22  2:28 PM   Specimen: Anterior Nasal Swab  Result Value Ref Range Status   SARS Coronavirus 2 by RT PCR NEGATIVE NEGATIVE Final    Comment: (NOTE) SARS-CoV-2 target nucleic acids are NOT DETECTED.  The SARS-CoV-2 RNA is generally detectable in upper respiratory specimens during the acute phase of infection. The lowest concentration of SARS-CoV-2 viral copies this assay can detect is 138 copies/mL. A negative result does not preclude SARS-Cov-2 infection and should not be used as the sole basis for treatment or other patient management decisions. A negative result may occur with  improper specimen collection/handling, submission  of specimen other than nasopharyngeal swab, presence of viral mutation(s) within the areas targeted by this assay, and inadequate number of viral copies(<138 copies/mL). A negative result must be combined with clinical observations, patient history, and epidemiological information. The expected result is Negative.  Fact Sheet for Patients:  EntrepreneurPulse.com.au  Fact Sheet for Healthcare Providers:  IncredibleEmployment.be  This test is no t yet approved or cleared by the [place] FDA and  has been authorized for detection and/or diagnosis of SARS-CoV-2 by FDA under an Emergency Use Authorization (EUA). This EUA will remain  in effect (meaning this test can be used) for the duration of the COVID-19 declaration under Section 564(b)(1) of the Act, 21 U.S.C.section 360bbb-3(b)(1), unless the authorization is terminated  or revoked sooner.       Influenza A by PCR NEGATIVE NEGATIVE Final    Influenza B by PCR NEGATIVE NEGATIVE Final    Comment: (NOTE) The Xpert Xpress SARS-CoV-2/FLU/RSV plus assay is intended as an aid in the diagnosis of influenza from Nasopharyngeal swab specimens and should not be used as a sole basis for treatment. Nasal washings and aspirates are unacceptable for Xpert Xpress SARS-CoV-2/FLU/RSV testing.  Fact Sheet for Patients: EntrepreneurPulse.com.au  Fact Sheet for Healthcare Providers: IncredibleEmployment.be  This test is not yet approved or cleared by the [place] FDA and has been authorized for detection and/or diagnosis of SARS-CoV-2 by FDA under an Emergency Use Authorization (EUA). This EUA will remain in effect (meaning this test can be used) for the duration of the COVID-19 declaration under Section 564(b)(1) of the Act, 21 U.S.C. section 360bbb-3(b)(1), unless the authorization is terminated or revoked.  Performed at [facility], [address] [place], [place] [address]      Time coordinating discharge: Over 30 minutes  SIGNED:   Wyvonnia Dusky, MD  [facility] 08/02/2022, 11:55 AM Pager   If 7PM-7AM, please contact night-coverage [email or web address]

## 2022-09-15 NOTE — Progress Notes (Unsigned)
PROVIDER NOTE: Information contained herein reflects review and annotations entered in association with encounter. Interpretation of such information and data should be left to medically-trained personnel. Information provided to patient can be located elsewhere in the medical record under "Patient Instructions". Document created using STT-dictation technology, any transcriptional errors that may result from process are unintentional.    Patient: Elizabeth [name]  Service Category: E/M  Provider: Gaspar Cola, MD  DOB: Oct 23, 1960  DOS: 09/16/2022  Referring Provider: Bunnie Pion, FNP  MRN: [id number]  Specialty: Interventional Pain Management  PCP: Bunnie Pion, FNP  Type: Established Patient  Setting: Ambulatory outpatient    Location: Office  Delivery: Face-to-face     HPI  Elizabeth [name], a 61 y.o. year old female, is here today because of her No primary diagnosis found.. Elizabeth [name]'s primary complain today is No chief complaint on file. Last encounter: My last encounter with her was on 06/24/2022. Pertinent problems: Elizabeth [name] has Chronic low back pain (Bilateral) w/o sciatica; Chronic pain syndrome; Presence of spinal cord stimulator; Chronic lower extremity pain (1ry area of Pain) (Bilateral); Lumbar facet syndrome (Bilateral); Lumbar spondylosis; Complex regional pain syndrome type 1 of upper extremities (Bilateral); and Complex regional pain syndrome type 1 of lower extremities (Bilateral) on their pertinent problem list. Pain Assessment: Severity of   is reported as a  /10. Location:    / . Onset:  . Quality:  . Timing:  . Modifying factor(s):  Marland Kitchen Vitals:  vitals were not taken for this visit.  BMI: Estimated body mass index is 33.97 kg/m as calculated from the following:   Height as of 07/29/22: _0  (1.448 m).   Weight as of 08/01/22: 156 lb 15.5 oz (71.2 kg).  Reason for encounter: medication management. ***  RTCB:   Pharmacotherapy Assessment   Analgesic: Oxycodone IR 5 mg, 1 tab PO q 6 hrs (20 mg/day of oxycodone) MME/day: 30 mg/day.   Monitoring: [place] PMP: PDMP reviewed during this encounter.       Pharmacotherapy: No side-effects or adverse reactions reported. Compliance: No problems identified. Effectiveness: Clinically acceptable.  No notes on file  No results found for: "CBDTHCR" No results found for: "D8THCCBX" No results found for: "D9THCCBX"  UDS:  Summary  Date Value Ref Range Status  03/11/2022 Note  Final    Comment:    ==================================================================== [facility] (MW) ==================================================================== Test                             Result       Flag       Units  Drug Present and Declared for Prescription Verification   Hydrocodone                    3003         EXPECTED   ng/mg creat   Hydromorphone                  792          EXPECTED   ng/mg creat   Dihydrocodeine                 649          EXPECTED   ng/mg creat   Norhydrocodone                 5814         EXPECTED  ng/mg creat    Sources of hydrocodone include scheduled prescription medications.    Hydromorphone, dihydrocodeine and norhydrocodone are expected    metabolites of hydrocodone. Hydromorphone and dihydrocodeine are    also available as scheduled prescription medications.  Drug Present not Declared for Prescription Verification   Amphetamine                    300          UNEXPECTED ng/mg creat    Amphetamine is available as a schedule II prescription drug.    Oxazepam                       247          UNEXPECTED ng/mg creat    Oxazepam may be administered as a scheduled prescription medication;    it is also an expected metabolite of other benzodiazepine drugs,    including diazepam, chlordiazepoxide, prazepam, clorazepate,    halazepam, and temazepam.  ==================================================================== Test                       Result    Flag   Units      Ref Range   Creatinine              78               mg/dL      >=20 ==================================================================== Declared Medications:  The flagging and interpretation on this report are based on the  following declared medications.  Unexpected results may arise from  inaccuracies in the declared medications.   **Note: The testing scope of this panel includes these medications:   Hydrocodone (Norco)   **Note: The testing scope of this panel does not include the  following reported medications:   Acetaminophen (Norco)  Albuterol (Ventolin HFA)  Albuterol (Duoneb)  Atorvastatin (Lipitor)  Carvedilol (Coreg)  Formoterol (Dulera)  Furosemide (Lasix)  Hydroxyzine (Atarax)  Ipratropium (Duoneb)  Mometasone (Dulera)  Naloxone (Narcan)  Potassium (Klor-Con)  Spironolactone (Aldactone) ==================================================================== For clinical consultation, please call [phone]. ====================================================================       ROS  Constitutional: Denies any fever or chills Gastrointestinal: No reported hemesis, hematochezia, vomiting, or acute GI distress Musculoskeletal: Denies any acute onset joint swelling, redness, loss of ROM, or weakness Neurological: No reported episodes of acute onset apraxia, aphasia, dysarthria, agnosia, amnesia, paralysis, loss of coordination, or loss of consciousness  Medication Review  HYDROcodone-acetaminophen, albuterol, atorvastatin, carvedilol, mometasone-formoterol, naloxone, pantoprazole, potassium chloride SA, predniSONE, spironolactone, and torsemide  History Review  Allergy: Elizabeth [name] is allergic to lisinopril. Drug: Elizabeth [name]  reports no history of drug use. Alcohol:  reports no history of alcohol use. Tobacco:  reports that she quit smoking about 4 years ago. Her smoking use included cigarettes. She has a 60.00 pack-year  smoking history. She has never used smokeless tobacco. Social: Elizabeth [name]  reports that she quit smoking about 4 years ago. Her smoking use included cigarettes. She has a 60.00 pack-year smoking history. She has never used smokeless tobacco. She reports that she does not drink alcohol and does not use drugs. Medical:  has a past medical history of Anxiety, Arm pain (07/25/2015), CHF (congestive heart failure) ([place]), Congestive heart failure ([place]) (07/25/2015), COPD (chronic obstructive pulmonary disease) ([place]), Depression, Hypertension, Lower extremity pain (07/25/2015), Reflux, RSD (reflex sympathetic dystrophy), Spinal cord stimulator status, Vitamin B12 deficiency, and Vitamin D deficiency. Surgical: Elizabeth [name]  has a past  surgical history that includes Spinal cord stimulator implant. Family: family history includes Cancer in her father; Cirrhosis in her mother.  Laboratory Chemistry Profile   Renal Lab Results  Component Value Date   BUN 34 (H) 08/02/2022   CREATININE 0.79 08/02/2022   BCR 17 08/30/2017   GFRAA >60 08/18/2018   GFRNONAA >60 08/02/2022    Hepatic Lab Results  Component Value Date   AST 15 05/21/2022   ALT 20 05/21/2022   ALBUMIN 3.9 05/21/2022   ALKPHOS 83 05/21/2022    Electrolytes Lab Results  Component Value Date   NA 138 08/02/2022   K 3.8 08/02/2022   CL 102 08/02/2022   CALCIUM 8.7 (L) 08/02/2022   MG 2.8 (H) 05/20/2022   PHOS 3.4 04/20/2021    Bone Lab Results  Component Value Date   VD25OH 25.3 (L) 03/18/2016   VD125OH2TOT 76.2 03/18/2016   25OHVITD1 37 08/30/2017   25OHVITD2 1.0 08/30/2017   25OHVITD3 36 08/30/2017    Inflammation (CRP: Acute Phase) (ESR: Chronic Phase) Lab Results  Component Value Date   CRP 4.4 08/30/2017   ESRSEDRATE 30 08/30/2017         Note: Above Lab results reviewed.  Recent Imaging Review  US Venous Img Lower Bilateral (DVT) CLINICAL DATA:  Leg edema  EXAM: BILATERAL LOWER EXTREMITY VENOUS DOPPLER  ULTRASOUND  TECHNIQUE: Gray-scale sonography with graded compression, as well as color Doppler and duplex ultrasound were performed to evaluate the lower extremity deep venous systems from the level of the common femoral vein and including the common femoral, femoral, profunda femoral, popliteal and calf veins including the posterior tibial, peroneal and gastrocnemius veins when visible. The superficial great saphenous vein was also interrogated. Spectral Doppler was utilized to evaluate flow at rest and with distal augmentation maneuvers in the common femoral, femoral and popliteal veins.  COMPARISON:  03/08/2014, 08/15/2010  FINDINGS: RIGHT LOWER EXTREMITY  Common Femoral Vein: No evidence of thrombus. Normal compressibility, respiratory phasicity and response to augmentation.  Saphenofemoral Junction: No evidence of thrombus. Normal compressibility and flow on color Doppler imaging.  Profunda Femoral Vein: No evidence of thrombus. Normal compressibility and flow on color Doppler imaging.  Femoral Vein: No evidence of thrombus. Normal compressibility, respiratory phasicity and response to augmentation.  Popliteal Vein: No evidence of thrombus. Normal compressibility, respiratory phasicity and response to augmentation.  Calf Veins: No evidence of thrombus. Normal compressibility and flow on color Doppler imaging.  Superficial Great Saphenous Vein: No evidence of thrombus. Normal compressibility.  Venous Reflux:  None.  Other Findings:  None.  LEFT LOWER EXTREMITY  Common Femoral Vein: No evidence of thrombus. Normal compressibility, respiratory phasicity and response to augmentation.  Saphenofemoral Junction: No evidence of thrombus. Normal compressibility and flow on color Doppler imaging.  Profunda Femoral Vein: No evidence of thrombus. Normal compressibility and flow on color Doppler imaging.  Femoral Vein: No evidence of thrombus. Normal  compressibility, respiratory phasicity and response to augmentation.  Popliteal Vein: No evidence of thrombus. Normal compressibility, respiratory phasicity and response to augmentation.  Calf Veins: No evidence of thrombus. Normal compressibility and flow on color Doppler imaging.  Superficial Great Saphenous Vein: No evidence of thrombus. Normal compressibility.  Venous Reflux:  None.  Other Findings:  None.  IMPRESSION: No evidence of deep venous thrombosis in either lower extremity.  Electronically Signed   By: Davina Poke D.O.   On: 07/29/2022 16:24 CT ABDOMEN PELVIS W CONTRAST CLINICAL DATA:  Constipation, shortness of breath  EXAM: CT ABDOMEN  AND PELVIS WITH CONTRAST  TECHNIQUE: Multidetector CT imaging of the abdomen and pelvis was performed using the standard protocol following bolus administration of intravenous contrast.  RADIATION DOSE REDUCTION: This exam was performed according to the departmental dose-optimization program which includes automated exposure control, adjustment of the mA and/or kV according to patient size and/or use of iterative reconstruction technique.  CONTRAST:  14m OMNIPAQUE IOHEXOL 300 MG/ML  SOLN  COMPARISON:  05/24/2018  FINDINGS: Lower chest: Small linear densities in the lower lung fields may suggest minimal scarring.  Hepatobiliary: No focal abnormalities are seen in liver. There is slight prominence of intrahepatic bile ducts. There is high density in the dependent portion of gallbladder lumen. There is no wall thickening in gallbladder. There is no fluid around the gallbladder.  Pancreas: No focal abnormalities are seen. Diverticulum is noted along the inner margin of second portion of duodenum.  Spleen: Unremarkable.  Adrenals/Urinary Tract: Adrenals are unremarkable. There is no significant hydronephrosis. Right renal pelvis is more prominent, possibly due to extrarenal location. Ureters are not dilated.  There are multiple calcifications in both sides of pelvis, possibly vascular calcifications. Urinary bladder is unremarkable.  Stomach/Bowel: Stomach is not distended. Small bowel loops are not dilated. Appendix is not dilated. There is no significant wall thickening in colon. There is no pericolic stranding.  Vascular/Lymphatic: Arterial calcifications are seen.  Reproductive: Possible calcified uterine fibroid is seen.  Other: There is no ascites or pneumoperitoneum. There is an electronic device in subcutaneous plane in the left gluteal region with lead extending towards the thoracic spine.  Musculoskeletal: There is first-degree anterolisthesis at L4-L5 level. Degenerative changes are noted in facet joints, more so at L4-L5 level.  IMPRESSION: There is no evidence of intestinal obstruction or pneumoperitoneum. There is no hydronephrosis.  High density in the dependent portion of gallbladder lumen suggests presence of sludge and possibly tiny stones.  Lumbar spondylosis.  Electronically Signed   By: PElmer PickerM.D.   On: 07/29/2022 14:00 DG Chest 2 View CLINICAL DATA:  Shortness of breath. Denies swelling. Audible wheezing.  EXAM: CHEST - 2 VIEW  COMPARISON:  AP chest 05/14/2022, 04/17/2021; CT chest 12/18/2020  FINDINGS: Cardiac silhouette is at the upper limits of normal size. Mediastinal contours are within normal limits with mild calcification within the aortic arch. Mild bilateral lower lung interstitial thickening is unchanged from prior, mild chronic scarring. No new focal airspace opacity to indicate pneumonia. No pulmonary edema, pleural effusion, or pneumothorax. Mild multilevel degenerative disc changes of the thoracic spine. A spinal cord stimulator electrode is again seen, with leads overlying the lower cervical spine.  IMPRESSION: No active cardiopulmonary disease.  Electronically Signed   By: RYvonne KendallM.D.   On: 07/29/2022  10:59 Note: Reviewed        Physical Exam  General appearance: Well nourished, well developed, and well hydrated. In no apparent acute distress Mental status: Alert, oriented x 3 (person, place, & time)       Respiratory: No evidence of acute respiratory distress Eyes: PERLA Vitals: There were no vitals taken for this visit. BMI: Estimated body mass index is 33.97 kg/m as calculated from the following:   Height as of 07/29/22: _0  (1.448 m).   Weight as of 08/01/22: 156 lb 15.5 oz (71.2 kg). Ideal: Patient weight not recorded  Assessment   Diagnosis Status  1. Chronic pain syndrome   2. Pharmacologic therapy   3. Presence of spinal cord stimulator   4. Encounter for medication  management   5. Encounter for chronic pain management   6. Complex regional pain syndrome type 1 of lower extremities (Bilateral)   7. Chronic use of opiate for therapeutic purpose   8. Complex regional pain syndrome type 1 of upper extremities (Bilateral)   9. Chronic lower extremity pain (1ry area of Pain) (Bilateral)    Controlled Controlled Controlled   Updated Problems: No problems updated.  Plan of Care  Problem-specific:  No problem-specific Assessment & Plan notes found for this encounter.  Elizabeth [name] has a current medication list which includes the following long-term medication(s): albuterol, albuterol, atorvastatin, carvedilol, hydrocodone-acetaminophen, mometasone-formoterol, and torsemide.  Pharmacotherapy (Medications Ordered): No orders of the defined types were placed in this encounter.  Orders:  No orders of the defined types were placed in this encounter.  Follow-up plan:   No follow-ups on file.     Interventional Therapies  Risk  Complexity Considerations:   Estimated body mass index is 28.13 kg/m as calculated from the following:   Height as of this encounter: _0  (1.448 m).   Weight as of this encounter: 130 lb (59 kg). WNL   Planned  Pending:       Under consideration:   Diagnostic bilateral lumbar facet MBB  Possible bilateral lumbar sympathetic RFA    Completed:   Therapeutic right stellate ganglion Blk (SGB) (Multiple)  Therapeutic left stellate ganglion Blk (SGB) (Multiple)  Therapeutic right lumbar sympathetic Blk (LSB) (Multiple)  Hospital admission for therapeutic cervical epidural local anesthetic infusion (Multiple)  Hospital admission for therapeutic Lumbar epidural local anesthetic infusion (Multiple)    Therapeutic  Palliative (PRN) options:   Palliative SGB  Palliative  LSB       Recent Visits Date Type Provider Dept  06/24/22 Office Visit Milinda Pointer, MD [facility]  Showing recent visits within past 90 days and meeting all other requirements Future Appointments Date Type Provider Dept  09/16/22 Appointment Milinda Pointer, MD [facility]  Showing future appointments within next 90 days and meeting all other requirements  I discussed the assessment and treatment plan with the patient. The patient was provided an opportunity to [name] questions and all were answered. The patient agreed with the plan and demonstrated an understanding of the instructions.  Patient advised to call back or seek an in-person evaluation if the symptoms or condition worsens.  Duration of encounter: *** minutes.  Total time on encounter, as per AMA guidelines included both the face-to-face and non-face-to-face time personally spent by the physician and/or other qualified health care professional(s) on the day of the encounter (includes time in activities that require the physician or other qualified health care professional and does not include time in activities normally performed by clinical staff). Physician's time may include the following activities when performed: preparing to see the patient (eg, review of tests, pre-charting review of records) obtaining and/or reviewing separately obtained  history performing a medically appropriate examination and/or evaluation counseling and educating the patient/family/caregiver ordering medications, tests, or procedures referring and communicating with other health care professionals (when not separately reported) documenting clinical information in the electronic or other health record independently interpreting results (not separately reported) and communicating results to the patient/ family/caregiver care coordination (not separately reported)  Note by: Gaspar Cola, MD Date: 09/16/2022; Time: 5:16 PM

## 2022-09-16 ENCOUNTER — Encounter: Payer: Self-pay | Admitting: Pain Medicine

## 2022-09-16 ENCOUNTER — Ambulatory Visit: Payer: Medicaid Other | Attending: Pain Medicine | Admitting: Pain Medicine

## 2022-09-16 VITALS — BP 113/90 | HR 87 | Temp 97.4°F | Resp 18 | Ht <= 58 in | Wt 146.0 lb

## 2022-09-16 DIAGNOSIS — G8929 Other chronic pain: Secondary | ICD-10-CM | POA: Diagnosis present

## 2022-09-16 DIAGNOSIS — M79604 Pain in right leg: Secondary | ICD-10-CM | POA: Diagnosis present

## 2022-09-16 DIAGNOSIS — G90523 Complex regional pain syndrome I of lower limb, bilateral: Secondary | ICD-10-CM | POA: Diagnosis present

## 2022-09-16 DIAGNOSIS — M545 Low back pain, unspecified: Secondary | ICD-10-CM | POA: Diagnosis present

## 2022-09-16 DIAGNOSIS — M79605 Pain in left leg: Secondary | ICD-10-CM | POA: Diagnosis present

## 2022-09-16 DIAGNOSIS — Z79899 Other long term (current) drug therapy: Secondary | ICD-10-CM | POA: Insufficient documentation

## 2022-09-16 DIAGNOSIS — M47816 Spondylosis without myelopathy or radiculopathy, lumbar region: Secondary | ICD-10-CM | POA: Diagnosis present

## 2022-09-16 DIAGNOSIS — Z969 Presence of functional implant, unspecified: Secondary | ICD-10-CM

## 2022-09-16 DIAGNOSIS — Z79891 Long term (current) use of opiate analgesic: Secondary | ICD-10-CM | POA: Insufficient documentation

## 2022-09-16 DIAGNOSIS — G90513 Complex regional pain syndrome I of upper limb, bilateral: Secondary | ICD-10-CM | POA: Diagnosis present

## 2022-09-16 DIAGNOSIS — G894 Chronic pain syndrome: Secondary | ICD-10-CM | POA: Diagnosis present

## 2022-09-16 MED ORDER — HYDROCODONE-ACETAMINOPHEN 5-325 MG PO TABS: 1.0000 | ORAL_TABLET | Freq: Four times a day (QID) | ORAL | 0 refills | Status: DC | PRN

## 2022-09-16 NOTE — Progress Notes (Signed)
Nursing Pain Medication Assessment:  Safety precautions to be maintained throughout the outpatient stay will include: orient to surroundings, keep bed in low position, maintain call bell within reach at all times, provide assistance with transfer out of bed and ambulation.  Medication Inspection Compliance: Pill count conducted under aseptic conditions, in front of the patient. Neither the pills nor the bottle was removed from the patient's sight at any time. Once count was completed pills were immediately returned to the patient in their original bottle.  Medication: Hydrocodone/APAP Pill/Patch Count:  20 of 120 pills remain Pill/Patch Appearance: Markings consistent with prescribed medication Bottle Appearance: Standard pharmacy container. Clearly labeled. Filled Date: 54 / 11 / 2023 Last Medication intake:  Today

## 2022-09-16 NOTE — Patient Instructions (Signed)
____________________________________________________________________________________________  Patient Information update  To: All of our patients.  Re: Name change.  It has been made official that our current name, "[facility]"   will soon be changed to "[facility]".   The purpose of this change is to eliminate any confusion created by the concept of our practice being a "Medication Management Pain Clinic". In the past this has led to the misconception that we treat pain primarily by the use of prescription medications.  Nothing can be farther from the truth.   Understanding PAIN MANAGEMENT: To further understand what our practice does, you first have to understand that "Pain Management" is a subspecialty that requires additional training once a physician has completed their specialty training, which can be in either Anesthesia, Neurology, Psychiatry, or Physical Medicine and Rehabilitation (PMR). Each one of these contributes to the final approach taken by each physician to the management of their patient's pain. To be a "Pain Management Specialist" you must have first completed one of the specialty trainings below.  Anesthesiologists - trained in clinical pharmacology and interventional techniques such as nerve blockade and regional as well as central neuroanatomy. They are trained to block pain before, during, and after surgical interventions.  Neurologists - trained in the diagnosis and pharmacological treatment of complex neurological conditions, such as Multiple Sclerosis, Parkinson's, spinal cord injuries, and other systemic conditions that may be associated with symptoms that may include but are not limited to pain. They tend to rely primarily on the treatment of chronic pain using prescription medications.  Psychiatrist - trained in conditions affecting the psychosocial  wellbeing of patients including but not limited to depression, anxiety, schizophrenia, personality disorders, addiction, and other substance use disorders that may be associated with chronic pain. They tend to rely primarily on the treatment of chronic pain using prescription medications.   Physical Medicine and Rehabilitation (PMR) physicians, also known as physiatrists - trained to treat a wide variety of medical conditions affecting the brain, spinal cord, nerves, bones, joints, ligaments, muscles, and tendons. Their training is primarily aimed at treating patients that have suffered injuries that have caused severe physical impairment. Their training is primarily aimed at the physical therapy and rehabilitation of those patients. They may also work alongside orthopedic surgeons or neurosurgeons using their expertise in assisting surgical patients to recover after their surgeries.  INTERVENTIONAL PAIN MANAGEMENT is sub-subspecialty of Pain Management.  Our physicians are Board-certified in Anesthesia, Pain Management, and Interventional Pain Management.  This meaning that not only have they been trained and Board-certified in their specialty of Anesthesia, and subspecialty of Pain Management, but they have also received further training in the sub-subspecialty of Interventional Pain Management, in order to become Board-certified as INTERVENTIONAL PAIN MANAGEMENT SPECIALIST.    Mission: Our goal is to use our skills in  INTERVENTIONAL PAIN MANAGEMENT as alternatives to the chronic use of prescription opioid medications for the treatment of pain. To make this more clear, we have changed our name to reflect what we do and offer. We will continue to offer medication management assessment and recommendations, but we will not be taking over any patient's medication management.  ____________________________________________________________________________________________      _______________________________________________________________________  Medication Rules  Purpose: To inform patients, and their family members, of our medication rules and regulations.  Applies to: All patients receiving prescriptions from our practice (written or electronic).  Pharmacy of record: This is the pharmacy where your electronic prescriptions   will be sent. Make sure we have the correct one.  Electronic prescriptions: In compliance with the [place] Strengthen Opioid Misuse Prevention (STOP) Act of 2017 (Session Law 2017-74/H243), effective October 12, 2018, all controlled substances must be electronically prescribed. Written prescriptions, faxing, or calling prescriptions to a pharmacy will no longer be done.  Prescription refills: These will be provided only during in-person appointments. No medications will be renewed without a "face-to-face" evaluation with your provider. Applies to all prescriptions.  NOTE: The following applies primarily to controlled substances (Opioid* Pain Medications).   Type of encounter (visit): For patients receiving controlled substances, face-to-face visits are required. (Not an option and not up to the patient.)  Patient's responsibilities: Pain Pills: Bring all pain pills to every appointment (except for procedure appointments). Pill Bottles: Bring pills in original pharmacy bottle. Bring bottle, even if empty. Always bring the bottle of the most recent fill.  Medication refills: You are responsible for knowing and keeping track of what medications you are taking and when is it that you will need a refill. The day before your appointment: write a list of all prescriptions that need to be refilled. The day of the appointment: give the list to the admitting nurse. Prescriptions will be written only during appointments. No prescriptions will be written on procedure days. If you forget a medication: it will not be "Called in", "Faxed", or  "electronically sent". You will need to get another appointment to get these prescribed. No early refills. Do not call asking to have your prescription filled early. Partial  or short prescriptions: Occasionally your pharmacy may not have enough pills to fill your prescription.  NEVER ACCEPT a partial fill or a prescription that is short of the total amount of pills that you were prescribed.  With controlled substances the law allows 72 hours for the pharmacy to complete the prescription.  If the prescription is not completed within 72 hours, the pharmacist will require a new prescription to be written. This means that you will be short on your medicine and we WILL NOT send another prescription to complete your original prescription.  Instead, request the pharmacy to send a carrier to a nearby branch to get enough medication to provide you with your full prescription. Prescription Accuracy: You are responsible for carefully inspecting your prescriptions before leaving our office. Have the discharge nurse carefully go over each prescription with you, before taking them home. Make sure that your name is accurately spelled, that your address is correct. Check the name and dose of your medication to make sure it is accurate. Check the number of pills, and the written instructions to make sure they are clear and accurate. Make sure that you are given enough medication to last until your next medication refill appointment. Taking Medication: Take medication as prescribed. When it comes to controlled substances, taking less pills or less frequently than prescribed is permitted and encouraged. Never take more pills than instructed. Never take the medication more frequently than prescribed.  Inform other Doctors: Always inform, all of your healthcare providers, of all the medications you take. Pain Medication from other Providers: You are not allowed to accept any additional pain medication from any other Doctor or  Healthcare provider. There are two exceptions to this rule. (see below) In the event that you require additional pain medication, you are responsible for notifying us, as stated below. Cough Medicine: Often these contain an opioid, such as codeine or hydrocodone. Never accept or take cough medicine containing   these opioids if you are already taking an opioid* medication. The combination may cause respiratory failure and death. Medication Agreement: You are responsible for carefully reading and following our Medication Agreement. This must be signed before receiving any prescriptions from our practice. Safely store a copy of your signed Agreement. Violations to the Agreement will result in no further prescriptions. (Additional copies of our Medication Agreement are available upon request.) Laws, Rules, & Regulations: All patients are expected to follow all Federal and State Laws, Statutes, Rules, & Regulations. Ignorance of the Laws does not constitute a valid excuse.  Illegal drugs and Controlled Substances: The use of illegal substances (including, but not limited to marijuana and its derivatives) and/or the illegal use of any controlled substances is strictly prohibited. Violation of this rule may result in the immediate and permanent discontinuation of any and all prescriptions being written by our practice. The use of any illegal substances is prohibited. Adopted CDC guidelines & recommendations: Target dosing levels will be at or below 60 MME/day. Use of benzodiazepines** is not recommended.  Exceptions: There are only two exceptions to the rule of not receiving pain medications from other Healthcare Providers. Exception #1 (Emergencies): In the event of an emergency (i.e.: accident requiring emergency care), you are allowed to receive additional pain medication. However, you are responsible for: As soon as you are able, call our office [phone], at any time of the day or night, and leave a  message stating your name, the date and nature of the emergency, and the name and dose of the medication prescribed. In the event that your call is answered by a member of our staff, make sure to document and save the date, time, and the name of the person that took your information.  Exception #2 (Planned Surgery): In the event that you are scheduled by another doctor or dentist to have any type of surgery or procedure, you are allowed (for a period no longer than 30 days), to receive additional pain medication, for the acute post-op pain. However, in this case, you are responsible for picking up a copy of our "Post-op Pain Management for Surgeons" handout, and giving it to your surgeon or dentist. This document is available at our office, and does not require an appointment to obtain it. Simply go to our office during business hours (Monday-Thursday from 8:00 AM to 4:00 PM) (Friday 8:00 AM to 12:00 Noon) or if you have a scheduled appointment with us, prior to your surgery, and ask for it by name. In addition, you are responsible for: calling our office [phone], at any time of the day or night, and leaving a message stating your name, name of your surgeon, type of surgery, and date of procedure or surgery. Failure to comply with your responsibilities may result in termination of therapy involving the controlled substances. Medication Agreement Violation. Following the above rules, including your responsibilities will help you in avoiding a Medication Agreement Violation ("Breaking your Pain Medication Contract").  Consequences:  Not following the above rules may result in permanent discontinuation of medication prescription therapy.  *Opioid medications include: morphine, codeine, oxycodone, oxymorphone, hydrocodone, hydromorphone, meperidine, tramadol, tapentadol, buprenorphine, fentanyl, methadone. **Benzodiazepine medications include: diazepam (Valium), alprazolam (Xanax), clonazepam (Klonopine),  lorazepam (Ativan), clorazepate (Tranxene), chlordiazepoxide (Librium), estazolam (Prosom), oxazepam (Serax), temazepam (Restoril), triazolam (Halcion) (Last updated: 08/04/2022) ______________________________________________________________________    ______________________________________________________________________  Medication Recommendations and Reminders  Applies to: All patients receiving prescriptions (written and/or electronic).  Medication Rules & Regulations: You are responsible   for reading, knowing, and following our "Medication Rules" document. These exist for your safety and that of others. They are not flexible and neither are we. Dismissing or ignoring them is an act of "non-compliance" that may result in complete and irreversible termination of such medication therapy. For safety reasons, "non-compliance" will not be tolerated. As with the [place] fundamental legal principle of "ignorance of the law is no defense", we will accept no excuses for not having read and knowing the content of documents provided to you by our practice.  Pharmacy of record:  Definition: This is the pharmacy where your electronic prescriptions will be sent.  We do not endorse any particular pharmacy. It is up to you and your insurance to decide what pharmacy to use.  We do not restrict you in your choice of pharmacy. However, once we write for your prescriptions, we will NOT be re-sending more prescriptions to fix restricted supply problems created by your pharmacy, or your insurance.  The pharmacy listed in the electronic medical record should be the one where you want electronic prescriptions to be sent. If you choose to change pharmacy, simply notify our nursing staff. Changes will be made only during your regular appointments and not over the phone.  Recommendations: Keep all of your pain medications in a safe place, under lock and key, even if you live alone. We will NOT replace lost, stolen, or  damaged medication. We do not accept "Police Reports" as proof of medications having been stolen. After you fill your prescription, take 1 week's worth of pills and put them away in a safe place. You should keep a separate, properly labeled bottle for this purpose. The remainder should be kept in the original bottle. Use this as your primary supply, until it runs out. Once it's gone, then you know that you have 1 week's worth of medicine, and it is time to come in for a prescription refill. If you do this correctly, it is unlikely that you will ever run out of medicine. To make sure that the above recommendation works, it is very important that you make sure your medication refill appointments are scheduled at least 1 week before you run out of medicine. To do this in an effective manner, make sure that you do not leave the office without scheduling your next medication management appointment. Always ask the nursing staff to show you in your prescription , when your medication will be running out. Then arrange for the receptionist to get you a return appointment, at least 7 days before you run out of medicine. Do not wait until you have 1 or 2 pills left, to come in. This is very poor planning and does not take into consideration that we may need to cancel appointments due to bad weather, sickness, or emergencies affecting our staff. DO NOT ACCEPT A "Partial Fill": If for any reason your pharmacy does not have enough pills/tablets to completely fill or refill your prescription, do not allow for a "partial fill". The law allows the pharmacy to complete that prescription within 72 hours, without requiring a new prescription. If they do not fill the rest of your prescription within those 72 hours, you will need a separate prescription to fill the remaining amount, which we will NOT provide. If the reason for the partial fill is your insurance, you will need to talk to the pharmacist about payment alternatives for  the remaining tablets, but again, DO NOT ACCEPT A PARTIAL FILL, unless you can trust   your pharmacist to obtain the remainder of the pills within 72 hours.  Prescription refills and/or changes in medication(s):  Prescription refills, and/or changes in dose or medication, will be conducted only during scheduled medication management appointments. (Applies to both, written and electronic prescriptions.) No refills on procedure days. No medication will be changed or started on procedure days. No changes, adjustments, and/or refills will be conducted on a procedure day. Doing so will interfere with the diagnostic portion of the procedure. No phone refills. No medications will be "called into the pharmacy". No Fax refills. No weekend refills. No Holliday refills. No after hours refills.  Remember:  Business hours are:  Monday to Thursday 8:00 AM to 4:00 PM Provider's Schedule: Doxie Augenstein, MD - Appointments are:  Medication management: Monday and Wednesday 8:00 AM to 4:00 PM Procedure day: Tuesday and Thursday 7:30 AM to 4:00 PM Bilal Lateef, MD - Appointments are:  Medication management: Tuesday and Thursday 8:00 AM to 4:00 PM Procedure day: Monday and Wednesday 7:30 AM to 4:00 PM (Last update: 08/04/2022) ______________________________________________________________________    ____________________________________________________________________________________________  Pharmacy Shortages of Pain Medication   Introduction Shockingly as it may seem, .  "No U.[place] Supreme Court decision has ever interpreted the Constitution as guaranteeing a right to health care for all Americans." - https://www.healthequityandpolicylab.com/elusive-right-to-health-care-under-us-law  "With respect to human rights, the [place] has no formally codified right to health, nor does it participate in a human rights treaty that specifies a right to health." - Scott J. Schweikart, JD,  MBE  Situation By now, most of our patients have had the experience of being told by their pharmacist that they do not have enough medication to cover their prescription. If you have not had this experience, just know that you soon will.  Problem There appears to be a shortage of these medications, either at the national level or locally. This is happening with all pharmacies. When there is not enough medication, patients are offered a partial fill and they are told that they will try to get the rest of the medicine for them at a later time. If they do not have enough for even a partial fill, the pharmacists are telling the patients to call us (the prescribing physicians) to request that we send another prescription to another pharmacy to get the medicine.   This reordering of a controlled substance creates documentation problems where additional paperwork needs to be created to explain why two prescriptions for the same period of time and the same medicine are being prescribed to the same patient. It also creates situations where the last appointment note does not accurately reflect when and what prescriptions were given to a patient. This leads to prescribing errors down the line, in subsequent follow-up visits.   [place] Board of Pharmacy ([place]BOP) Research revealed that Board of Pharmacy Rule .1806 (21 NCAC 46.1806) authorizes pharmacists to the transfer of prescriptions among pharmacies, and it sets forth procedural and recordkeeping requirements for doing so. However, this requires the pharmacist to complete the previously mentioned procedural paperwork to accomplish the transfer. As it turns out, it is much easier for them to have the prescribing physicians do the work.   Possible solutions 1. You can ask your physician to assist you in weaning yourself off these medications. 2. Ask your pharmacy if the medication is in stock, 3 days prior to your refill. 3. If you need a pharmacy change,  let us know at your medication management visit. Prescriptions that have already been   electronically sent to a pharmacy will not be re-sent to a different pharmacy if your pharmacy of record does not have it in stock. Proper stocking of medication is a pharmacy problem, not a prescriber problem. Work with your pharmacist to solve the problem. 4. Have the [place] Assembly add a provision to the "STOP ACT" (the law that mandates how controlled substances are prescribed) where there is an exception to the electronic prescribing rule that states that in the event there are shortages of medications the physicians are allowed to use written prescriptions as opposed to electronic ones. This would allow patients to take their prescriptions to a different pharmacy that may have enough medication available to fill the prescription. The problem is that currently there is a law that does not allow for written prescriptions, with the exception of instances where the electronic medical record is down due to technical issues.  5. Have [place] Congress ease the pressure on pharmaceutical companies, allowing them to produce enough quantities of the medication to adequately supply the population. 6. Have pharmacies keep enough stocks of these medications to cover their client base.  7. Have the [place] State Assembly add a provision to the "STOP ACT" where they ease the regulations surrounding the transfer of controlled substances between pharmacies, so as to simplify the transfer of supplies. As an alternative, develop a system to allow patients to obtain the remainder of their prescription at another one of their pharmacies or at an associate pharmacy.   How this shortage will affect you.  Understand that this is a pharmacy supply problem, not a prescriber problem. Work with your pharmacy to solve it. The job of the prescriber is to evaluate and monitor the patient for the appropriate indications and use of  these medicines. It is not the job of the prescriber to supply the medication or to solve problems with that supply. The responsibility and the choice to obtain the medication resides on the patient. By law, supplying the medication is the job of the pharmacy. It is certainly not the job of the prescriber to solve supply problems.   Due to the above problems we are no longer taking patients to write for their pain medication. Future discussions with your physician may include potentially weaning medications or transitioning to alternatives.  We will be focusing primarily on interventional based pain management. We will continue to evaluate for appropriate indications and we may provide recommendations regarding medication, dose, and schedule, as well as monitoring recommendations, however, we will not be taking over the actual prescribing of these substances. On those patients where we are treating their chronic pain with interventional therapies, exceptions will be considered on a case by case basis. At this time, we will try to continue providing this supplemental service to those patients we have been managing in the past. However, as of August 1st, 2023, we no longer will be sending additional prescriptions to other pharmacies for the purpose of solving their supply problems. Once we send a prescription to a pharmacy, we will not be resending it again to another pharmacy to cover for their shortages.   What to do. Write as many letters as you can. Recruit the help of family members in writing these letters. Below are some of the places where you can write to make your voice heard. Let them know what the problem is and push them to look for solutions.   Search internet for: "[place] find your legislators" https://www.ncleg.gov/findyourlegislators  Search   internet for: "[place] insurance commissioner  complaints" https://www.ncdoi.gov/contactscomplaints/assistance-or-file-complaint  Search internet for: "[place] Board of Pharmacy complaints" http://www.ncbop.org/contact.htm  Search internet for: "[facility] pharmacy complaints" Email [facility] Customer Relations https://www.[facility].com/help/email-[email or web address].jsp?callType=store  Search internet for: "[facility] pharmacy customer service complaints" https://www.[facility].com/topic/marketing/contactus/contactus_customerservice.jsp  ____________________________________________________________________________________________     ____________________________________________________________________________________________  Drug Holidays ([place])  What is a "Drug Holiday"? Drug Holiday: is the name given to the period of time during which a patient stops taking a medication(s) for the purpose of eliminating tolerance to the drug.  Benefits Improved effectiveness of opioid medication. Decreased opioid dose needed to achieve benefits. Improved pain with lesser dose. Ending dependence on high dose therapy. Possible decrease in cost of therapy. It may uncover "opioid-induced hyperalgesia". (OIH)  What is "opioid hyperalgesia"? It is an increased paradoxical pain sensitization state caused by exposure to opioids, whereby a patient receiving opioids for the treatment of pain could actually become more sensitive to a painful stimuli. Stopping the opioid pain medication, contrary to the expected increase in the pain, it actually decreases or completely eliminates the pain. Ref.: "A comprehensive review of opioid-induced hyperalgesia". Marion Lee, et.al. Pain Physician. 2011 Mar-Apr;14(2):145-61.  What is tolerance? Tolerance: is the progressive decreased in effectiveness of a drug due to its repetitive use. With repetitive use, the body gets use to the medication and as a consequence, it loses its effectiveness. This is a common problem  seen with opioid pain medications. As a result, a larger dose of the drug is needed to achieve the same effect that used to be obtained with a smaller dose.  How long should a "Drug Holiday" last? Effectiveness depends on the patient staying off all opioid pain medicines for a minimum of 14 consecutive days. (2 weeks)  How about just taking less of the medicine? Does not work. This will not eliminate the excess receptors.  How about switching to a different pain medicine? (AKA. "Opioid rotation") This "technique" was promoted by studies funded by pharmaceutical companies, such as [employer], creators of "OxyContin". It does not work. It only creates the illusion of effectiveness by taking advantage of inaccurate equivalent dose calculations between different opioid medications.   Should I stop the medicine "cold turkey"? No. You should always coordinate with your Pain Specialist so that he/she can provide you with the correct medication dose to make the transition as smoothly as possible.  How do I stop the medicine? Slowly. You will be instructed to decrease the daily amount of pills that you take by one (1) pill every seven (7) days. This is called a "slow downward taper" of your dose. For example: if you normally take four (4) pills per day, you will be asked to drop this dose to three (3) pills per day for seven (7) days, then to two (2) pills per day for seven (7) days, then to one (1) per day for seven (7) days, and at the end of those last seven (7) days, this is when the "Drug Holiday" would start.   How about withdrawals? Typically, what triggers withdrawals is the sudden stop of a high dose opioid medication. Withdrawals can usually be avoided by slowly decreasing the dose over a prolonged period of time. If you attempt to stop your medication abruptly, withdrawals may be possible.  What are withdrawals? Withdrawals: refers to the wide range of symptoms that occur after stopping or  dramatically reducing opiate drugs after heavy and prolonged use. Withdrawal symptoms do not occur to patients that use low dose opioids,   or those who take the medication sporadically. Contrary to benzodiazepine (example: Valium, Xanax, etc.) or alcohol withdrawals ("Delirium Tremens"), opioid withdrawals are not lethal. Withdrawals are the physical manifestation of the body getting rid of the excess receptors.  Withdrawal Symptoms Early symptoms of withdrawal may include: Agitation Anxiety Muscle aches Increased tearing Insomnia Runny nose Sweating Yawning  Late symptoms of withdrawal may include: Abdominal cramping Diarrhea Dilated pupils Goose bumps Nausea Vomiting  Will I experience withdrawals? Due to the slow nature of the taper, it is very unlikely.  What is a slow taper? Taper: refers to the gradual decrease in dose.  (Last update: 09/07/2022) ____________________________________________________________________________________________    ____________________________________________________________________________________________  CBD (cannabidiol) & Delta-8 (Delta-8 tetrahydrocannabinol) WARNING  Intro: Cannabidiol (CBD) and tetrahydrocannabinol (THC), are two natural compounds found in plants of the Cannabis genus. They can both be extracted from hemp or cannabis. Hemp and cannabis come from the Cannabis sativa plant. Both compounds interact with your body's endocannabinoid system, but they have very different effects. CBD does not produce the high sensation associated with cannabis. Delta-8 tetrahydrocannabinol, also known as delta-8 THC, is a psychoactive substance found in the Cannabis sativa plant, of which marijuana and hemp are two varieties. THC is responsible for the high associated with the illicit use of marijuana.  Applicable to: All individuals currently taking or considering taking CBD (cannabidiol) and, more important, all patients taking opioid analgesic  controlled substances (pain medication). (Example: oxycodone; oxymorphone; hydrocodone; hydromorphone; morphine; methadone; tramadol; tapentadol; fentanyl; buprenorphine; butorphanol; dextromethorphan; meperidine; codeine; etc.)  Legal status: CBD remains a Schedule I drug prohibited for any use. CBD is illegal with one exception. In the [place], CBD has a limited Food and Drug Administration (FDA) approval for the treatment of two specific types of epilepsy disorders. Only one CBD product has been approved by the FDA for this purpose: "Epidiolex". FDA is aware that some companies are marketing products containing cannabis and cannabis-derived compounds in ways that violate the Federal Food, Drug and Cosmetic Act (FD&C Act) and that may put the health and safety of consumers at risk. The FDA, a Federal agency, has not enforced the CBD status since 2018. UPDATE: (11/28/2021) The Drug Enforcement Agency (DEA) issued a letter stating that "delta" cannabinoids, including Delta-8-THCO and Delta-9-THCO, synthetically derived from hemp do not qualify as hemp and will be viewed as Schedule I drugs. (Schedule I drugs, substances, or chemicals are defined as drugs with no currently accepted medical use and a high potential for abuse. Some examples of Schedule I drugs are: heroin, lysergic acid diethylamide (LSD), marijuana (cannabis), 3,4-methylenedioxymethamphetamine (ecstasy), methaqualone, and peyote.) (https://www.dea.gov)  Legality: Some manufacturers ship CBD products nationally, which is illegal. Often such products are sold online and are therefore available throughout the country. CBD is openly sold in head shops and health food stores in some states where such sales have not been explicitly legalized. Selling unapproved products with unsubstantiated therapeutic claims is not only a violation of the law, but also can put patients at risk, as these products have not been proven to be safe or effective.  Federal illegality makes it difficult to conduct research on CBD.  Reference: "FDA Regulation of Cannabis and Cannabis-Derived Products, Including Cannabidiol (CBD)" - https://www.fda.gov/news-events/public-health-focus/fda-regulation-cannabis-and-cannabis-derived-products-including-cannabidiol-cbd  Warning: CBD is not FDA approved and has not undergo the same manufacturing controls as prescription drugs.  This means that the purity and safety of available CBD may be questionable. Most of the time, despite manufacturer's claims, it is contaminated with THC (delta-9-tetrahydrocannabinol - the chemical   in marijuana responsible for the "HIGH").  When this is the case, the THC contaminant will trigger a positive urine drug screen (UDS) test for Marijuana (carboxy-THC). Because a positive UDS for any illicit substance is a violation of our medication agreement, your opioid analgesics (pain medicine) may be permanently discontinued. The FDA recently put out a warning about 5 things that everyone should be aware of regarding Delta-8 THC: Delta-8 THC products have not been evaluated or approved by the FDA for safe use and may be marketed in ways that put the public health at risk. The FDA has received adverse event reports involving delta-8 THC-containing products. Delta-8 THC has psychoactive and intoxicating effects. Delta-8 THC manufacturing often involve use of potentially harmful chemicals to create the concentrations of delta-8 THC claimed in the marketplace. The final delta-8 THC product may have potentially harmful by-products (contaminants) due to the chemicals used in the process. Manufacturing of delta-8 THC products may occur in uncontrolled or unsanitary settings, which may lead to the presence of unsafe contaminants or other potentially harmful substances. Delta-8 THC products should be kept out of the reach of children and pets.  MORE ABOUT CBD  General Information: CBD was discovered in 1940  and it is a derivative of the cannabis sativa genus plants (Marijuana and Hemp). It is one of the 113 identified substances found in Marijuana. It accounts for up to 40% of the plant's extract. As of 2018, preliminary clinical studies on CBD included research for the treatment of anxiety, movement disorders, and pain. CBD is available and consumed in multiple forms, including inhalation of smoke or vapor, as an aerosol spray, and by mouth. It may be supplied as an oil containing CBD, capsules, dried cannabis, or as a liquid solution. CBD is thought not to be as psychoactive as THC (delta-9-tetrahydrocannabinol - the chemical in marijuana responsible for the "HIGH"). Studies suggest that CBD may interact with different biological target receptors in the body, including cannabinoid and other neurotransmitter receptors. As of 2018 the mechanism of action for its biological effects has not been determined.  Side-effects  Adverse reactions: Dry mouth, diarrhea, decreased appetite, fatigue, drowsiness, malaise, weakness, sleep disturbances, and others.  Drug interactions: CBC may interact with other medications such as blood-thinners. Because CBD causes drowsiness on its own, it also increases the drowsiness caused by other medications, including antihistamines (such as Benadryl), benzodiazepines (Xanax, Ativan, Valium), antipsychotics, antidepressants and opioids, as well as alcohol and supplements such as kava, melatonin and St. John's Wort. Be cautious with the following combinations:   Brivaracetam (Briviact) Brivaracetam is changed and broken down by the body. CBD might decrease how quickly the body breaks down brivaracetam. This might increase levels of brivaracetam in the body.  Caffeine Caffeine is changed and broken down by the body. CBD might decrease how quickly the body breaks down caffeine. This might increase levels of caffeine in the body.  Carbamazepine (Tegretol) Carbamazepine is changed and  broken down by the body. CBD might decrease how quickly the body breaks down carbamazepine. This might increase levels of carbamazepine in the body and increase its side effects.  Citalopram (Celexa) Citalopram is changed and broken down by the body. CBD might decrease how quickly the body breaks down citalopram. This might increase levels of citalopram in the body and increase its side effects.  Clobazam (Onfi) Clobazam is changed and broken down by the liver. CBD might decrease how quickly the liver breaks down clobazam. This might increase the effects and side effects   of clobazam.  Eslicarbazepine (Aptiom) Eslicarbazepine is changed and broken down by the body. CBD might decrease how quickly the body breaks down eslicarbazepine. This might increase levels of eslicarbazepine in the body by a small amount.  Everolimus (Zostress) Everolimus is changed and broken down by the body. CBD might decrease how quickly the body breaks down everolimus. This might increase levels of everolimus in the body.  Lithium Taking higher doses of CBD might increase levels of lithium. This can increase the risk of lithium toxicity.  Medications changed by the liver (Cytochrome P450 1A1 (CYP1A1) substrates) Some medications are changed and broken down by the liver. CBD might change how quickly the liver breaks down these medications. This could change the effects and side effects of these medications.  Medications changed by the liver (Cytochrome P450 1A2 (CYP1A2) substrates) Some medications are changed and broken down by the liver. CBD might change how quickly the liver breaks down these medications. This could change the effects and side effects of these medications.  Medications changed by the liver (Cytochrome P450 1B1 (CYP1B1) substrates) Some medications are changed and broken down by the liver. CBD might change how quickly the liver breaks down these medications. This could change the effects and side  effects of these medications.  Medications changed by the liver (Cytochrome P450 2A6 (CYP2A6) substrates) Some medications are changed and broken down by the liver. CBD might change how quickly the liver breaks down these medications. This could change the effects and side effects of these medications.  Medications changed by the liver (Cytochrome P450 2B6 (CYP2B6) substrates) Some medications are changed and broken down by the liver. CBD might change how quickly the liver breaks down these medications. This could change the effects and side effects of these medications.  Medications changed by the liver (Cytochrome P450 2C19 (CYP2C19) substrates) Some medications are changed and broken down by the liver. CBD might change how quickly the liver breaks down these medications. This could change the effects and side effects of these medications.  Medications changed by the liver (Cytochrome P450 2C8 (CYP2C8) substrates) Some medications are changed and broken down by the liver. CBD might change how quickly the liver breaks down these medications. This could change the effects and side effects of these medications.  Medications changed by the liver (Cytochrome P450 2C9 (CYP2C9) substrates) Some medications are changed and broken down by the liver. CBD might change how quickly the liver breaks down these medications. This could change the effects and side effects of these medications.  Medications changed by the liver (Cytochrome P450 2D6 (CYP2D6) substrates) Some medications are changed and broken down by the liver. CBD might change how quickly the liver breaks down these medications. This could change the effects and side effects of these medications.  Medications changed by the liver (Cytochrome P450 2E1 (CYP2E1) substrates) Some medications are changed and broken down by the liver. CBD might change how quickly the liver breaks down these medications. This could change the effects and side effects  of these medications.  Medications changed by the liver (Cytochrome P450 3A4 (CYP3A4) substrates) Some medications are changed and broken down by the liver. CBD might change how quickly the liver breaks down these medications. This could change the effects and side effects of these medications.  Medications changed by the liver (Glucuronidated drugs) Some medications are changed and broken down by the liver. CBD might change how quickly the liver breaks down these medications. This could change the effects and side effects   of these medications.  Medications that decrease the breakdown of other medications by the liver (Cytochrome P450 2C19 (CYP2C19) inhibitors) CBD is changed and broken down by the liver. Some drugs decrease how quickly the liver changes and breaks down CBD. This could change the effects and side effects of CBD.  Medications that decrease the breakdown of other medications in the liver (Cytochrome P450 3A4 (CYP3A4) inhibitors) CBD is changed and broken down by the liver. Some drugs decrease how quickly the liver changes and breaks down CBD. This could change the effects and side effects of CBD.  Medications that increase breakdown of other medications by the liver (Cytochrome P450 3A4 (CYP3A4) inducers) CBD is changed and broken down by the liver. Some drugs increase how quickly the liver changes and breaks down CBD. This could change the effects and side effects of CBD.  Medications that increase the breakdown of other medications by the liver (Cytochrome P450 2C19 (CYP2C19) inducers) CBD is changed and broken down by the liver. Some drugs increase how quickly the liver changes and breaks down CBD. This could change the effects and side effects of CBD.  Methadone (Dolophine) Methadone is broken down by the liver. CBD might decrease how quickly the liver breaks down methadone. Taking cannabidiol along with methadone might increase the effects and side effects of  methadone.  Rufinamide (Banzel) Rufinamide is changed and broken down by the body. CBD might decrease how quickly the body breaks down rufinamide. This might increase levels of rufinamide in the body by a small amount.  Sedative medications (CNS depressants) CBD might cause sleepiness and slowed breathing. Some medications, called sedatives, can also cause sleepiness and slowed breathing. Taking CBD with sedative medications might cause breathing problems and/or too much sleepiness.  Sirolimus (Rapamune) Sirolimus is changed and broken down by the body. CBD might decrease how quickly the body breaks down sirolimus. This might increase levels of sirolimus in the body.  Stiripentol (Diacomit) Stiripentol is changed and broken down by the body. CBD might decrease how quickly the body breaks down stiripentol. This might increase levels of stiripentol in the body and increase its side effects.  Tacrolimus (Prograf) Tacrolimus is changed and broken down by the body. CBD might decrease how quickly the body breaks down tacrolimus. This might increase levels of tacrolimus in the body.  Tamoxifen (Soltamox) Tamoxifen is changed and broken down by the body. CBD might affect how quickly the body breaks down tamoxifen. This might affect levels of tamoxifen in the body.  Topiramate (Topamax) Topiramate is changed and broken down by the body. CBD might decrease how quickly the body breaks down topiramate. This might increase levels of topiramate in the body by a small amount.  Valproate Valproic acid can cause liver injury. Taking cannabidiol with valproic acid might increase the chance of liver injury. CBD and/or valproic acid might need to be stopped, or the dose might need to be reduced.  Warfarin (Coumadin) CBD might increase levels of warfarin, which can increase the risk for bleeding. CBD and/or warfarin might need to be stopped, or the dose might need to be reduced.  Zonisamide Zonisamide is  changed and broken down by the body. CBD might decrease how quickly the body breaks down zonisamide. This might increase levels of zonisamide in the body by a small amount. (Last update: 12/10/2021) ____________________________________________________________________________________________    

## 2022-10-03 ENCOUNTER — Emergency Department
Admission: EM | Admit: 2022-10-03 | Discharge: 2022-10-03 | Discharge status: Against Medical Advice | Payer: Medicaid Other | Attending: Emergency Medicine | Admitting: Emergency Medicine

## 2022-10-03 ENCOUNTER — Emergency Department: Payer: Medicaid Other

## 2022-10-03 ENCOUNTER — Other Ambulatory Visit: Payer: Self-pay

## 2022-10-03 DIAGNOSIS — R079 Chest pain, unspecified: Secondary | ICD-10-CM | POA: Diagnosis present

## 2022-10-03 DIAGNOSIS — Z5321 Procedure and treatment not carried out due to patient leaving prior to being seen by health care provider: Secondary | ICD-10-CM | POA: Diagnosis not present

## 2022-10-03 LAB — BASIC METABOLIC PANEL
Anion gap: 12 (ref 5–15)
BUN: 19 mg/dL (ref 8–23)
CO2: 25 mmol/L (ref 22–32)
Calcium: 9 mg/dL (ref 8.9–10.3)
Chloride: 103 mmol/L (ref 98–111)
Creatinine, Ser: 1.01 mg/dL — ABNORMAL HIGH (ref 0.44–1.00)
GFR, Estimated: >60 mL/min (ref >60)
Glucose, Bld: 119 mg/dL — ABNORMAL HIGH (ref 70–99)
Potassium: 4.6 mmol/L (ref 3.5–5.1)
Sodium: 140 mmol/L (ref 135–145)

## 2022-10-03 LAB — CBC
HCT: 27.3 % — ABNORMAL LOW (ref 36.0–46.0)
Hemoglobin: 7.7 g/dL — ABNORMAL LOW (ref 12.0–15.0)
MCH: 22.6 pg — ABNORMAL LOW (ref 26.0–34.0)
MCHC: 28.2 g/dL — ABNORMAL LOW (ref 30.0–36.0)
MCV: 80.1 fL (ref 80.0–100.0)
Platelets: 343 10*3/uL (ref 150–400)
RBC: 3.41 MIL/uL — ABNORMAL LOW (ref 3.87–5.11)
RDW: 17.1 % — ABNORMAL HIGH (ref 11.5–15.5)
WBC: 8.4 10*3/uL (ref 4.0–10.5)
nRBC: 0 % (ref 0.0–0.2)

## 2022-10-03 LAB — TROPONIN I (HIGH SENSITIVITY): Troponin I (High Sensitivity): 3 ng/L (ref <18)

## 2022-10-03 NOTE — ED Triage Notes (Signed)
Pt presents via EMS c/o sudden onset of left sided chest pain. Reports was asleep on couch, woke up at ate a tuna fish sandwich, then had sudden chest pain. Given ASA, SL nitro x1 by EMS.

## 2022-10-03 NOTE — ED Notes (Signed)
Pts family came up to the triage desk to let the nurse know that they would be leaving. Pt encouraged to wait due to her increased work of breathing - pt states " my pain is gone and I want to leave." Pts IV removed at this time.

## 2022-10-03 NOTE — ED Triage Notes (Signed)
First Nurse Note;  Pt via EMS from home. Pt states she woke up with SOB, then realized it more so chest pain. EMS gave 324 ASA and Nitro. 20 G R AC. Pt is A&Ox4 and NAD  122/84 but most recent BP 94/54  100% on RA 137 CBG

## 2022-11-16 ENCOUNTER — Telehealth: Payer: Self-pay | Admitting: Pain Medicine

## 2022-11-16 NOTE — Telephone Encounter (Signed)
PA sent to [facility] via fax with notes. kt

## 2022-11-16 NOTE — Telephone Encounter (Signed)
Patient need PA for Hydrocodone

## 2022-12-01 ENCOUNTER — Telehealth: Payer: Self-pay | Admitting: Pain Medicine

## 2022-12-01 NOTE — Telephone Encounter (Signed)
PT stated that the pharmacy is waiting on PA to refill patient prescription. Please give patient a call. TY

## 2022-12-02 NOTE — Telephone Encounter (Signed)
Called [facility], they did not receive the PA request that was sent on 11-16-22. I resent it.

## 2022-12-15 NOTE — Patient Instructions (Signed)
____________________________________________________________________________________________  Patient Information update  To: All of our patients.  Re: Name change.  It has been made official that our current name, "[facility]"   will soon be changed to "[facility]".   The purpose of this change is to eliminate any confusion created by the concept of our practice being a "Medication Management Pain Clinic". In the past this has led to the misconception that we treat pain primarily by the use of prescription medications.  Nothing can be farther from the truth.   Understanding PAIN MANAGEMENT: To further understand what our practice does, you first have to understand that "Pain Management" is a subspecialty that requires additional training once a physician has completed their specialty training, which can be in either Anesthesia, Neurology, Psychiatry, or Physical Medicine and Rehabilitation (PMR). Each one of these contributes to the final approach taken by each physician to the management of their patient's pain. To be a "Pain Management Specialist" you must have first completed one of the specialty trainings below.  Anesthesiologists - trained in clinical pharmacology and interventional techniques such as nerve blockade and regional as well as central neuroanatomy. They are trained to block pain before, during, and after surgical interventions.  Neurologists - trained in the diagnosis and pharmacological treatment of complex neurological conditions, such as Multiple Sclerosis, Parkinson's, spinal cord injuries, and other systemic conditions that may be associated with symptoms that may include but are not limited to pain. They tend to rely primarily on the treatment of chronic pain using prescription medications.  Psychiatrist - trained in conditions affecting the psychosocial  wellbeing of patients including but not limited to depression, anxiety, schizophrenia, personality disorders, addiction, and other substance use disorders that may be associated with chronic pain. They tend to rely primarily on the treatment of chronic pain using prescription medications.   Physical Medicine and Rehabilitation (PMR) physicians, also known as physiatrists - trained to treat a wide variety of medical conditions affecting the brain, spinal cord, nerves, bones, joints, ligaments, muscles, and tendons. Their training is primarily aimed at treating patients that have suffered injuries that have caused severe physical impairment. Their training is primarily aimed at the physical therapy and rehabilitation of those patients. They may also work alongside orthopedic surgeons or neurosurgeons using their expertise in assisting surgical patients to recover after their surgeries.  INTERVENTIONAL PAIN MANAGEMENT is sub-subspecialty of Pain Management.  Our physicians are Board-certified in Anesthesia, Pain Management, and Interventional Pain Management.  This meaning that not only have they been trained and Board-certified in their specialty of Anesthesia, and subspecialty of Pain Management, but they have also received further training in the sub-subspecialty of Interventional Pain Management, in order to become Board-certified as INTERVENTIONAL PAIN MANAGEMENT SPECIALIST.    Mission: Our goal is to use our skills in  [place] as alternatives to the chronic use of prescription opioid medications for the treatment of pain. To make this more clear, we have changed our name to reflect what we do and offer. We will continue to offer medication management assessment and recommendations, but we will not be taking over any patient's medication management.  ____________________________________________________________________________________________      ____________________________________________________________________________________________  Opioid Pain Medication Update  To: All patients taking opioid pain medications. (I.e.: hydrocodone, hydromorphone, oxycodone, oxymorphone, morphine, codeine, methadone, tapentadol, tramadol, buprenorphine, fentanyl, etc.)  Re: Updated review of side effects and adverse reactions of opioid analgesics, as well as new  information about long term effects of this class of medications.  Direct risks of long-term opioid therapy are not limited to opioid addiction and overdose. Potential medical risks include serious fractures, breathing problems during sleep, hyperalgesia, immunosuppression, chronic constipation, bowel obstruction, myocardial infarction, and tooth decay secondary to xerostomia.  Unpredictable adverse effects that can occur even if you take your medication correctly: Cognitive impairment, respiratory depression, and death. Most people think that if they take their medication "correctly", and "as instructed", that they will be safe. Nothing could be farther from the truth. In reality, a significant amount of recorded deaths associated with the use of opioids has occurred in individuals that had taken the medication for a long time, and were taking their medication correctly. The following are examples of how this can happen: Patient taking his/her medication for a long time, as instructed, without any side effects, is given a certain antibiotic or another unrelated medication, which in turn triggers a "Drug-to-drug interaction" leading to disorientation, cognitive impairment, impaired reflexes, respiratory depression or an untoward event leading to serious bodily harm or injury, including death.  Patient taking his/her medication for a long time, as instructed, without any side effects, develops an acute impairment of liver and/or kidney function. This will lead to a rapid inability of the body to  breakdown and eliminate their pain medication, which will result in effects similar to an "overdose", but with the same medicine and dose that they had always taken. This again may lead to disorientation, cognitive impairment, impaired reflexes, respiratory depression or an untoward event leading to serious bodily harm or injury, including death.  A similar problem will occur with patients as they grow older and their liver and kidney function begins to decrease as part of the aging process.  Background information: Historically, the original case for using long-term opioid therapy to treat chronic noncancer pain was based on safety assumptions that subsequent experience has called into question. In 1996, the American Pain Society and the [place] Academy of [employer] Medicine issued a consensus statement supporting long-term opioid therapy. This statement acknowledged the dangers of opioid prescribing but concluded that the risk for addiction was low; respiratory depression induced by opioids was short-lived, occurred mainly in opioid-naive patients, and was antagonized by pain; tolerance was not a common problem; and efforts to control diversion should not constrain opioid prescribing. This has now proven to be wrong. Experience regarding the risks for opioid addiction, misuse, and overdose in community practice has failed to support these assumptions.  According to the Centers for Disease Control and Prevention, fatal overdoses involving opioid analgesics have increased sharply over the past decade. Currently, more than 96,700 people die from drug overdoses every year. Opioids are a factor in 7 out of every 10 overdose deaths. Deaths from drug overdose have surpassed motor vehicle accidents as the leading cause of death for individuals between the ages of 80 and 71.  Clinical data suggest that neuroendocrine dysfunction may be very common in both men and women, potentially causing hypogonadism, erectile  dysfunction, infertility, decreased libido, osteoporosis, and depression. Recent studies linked higher opioid dose to increased opioid-related mortality. Controlled observational studies reported that long-term opioid therapy may be associated with increased risk for cardiovascular events. Subsequent meta-analysis concluded that the safety of long-term opioid therapy in elderly patients has not been proven.   Side Effects and adverse reactions: Common side effects: Drowsiness (sedation). Dizziness. Nausea and vomiting. Constipation. Physical dependence -- Dependence often manifests with withdrawal symptoms when opioids are discontinued  or decreased. Tolerance -- As you take repeated doses of opioids, you require increased medication to experience the same effect of pain relief. Respiratory depression -- This can occur in healthy people, especially with higher doses. However, people with COPD, asthma or other lung conditions may be even more susceptible to fatal respiratory impairment.  Uncommon side effects: An increased sensitivity to feeling pain and extreme response to pain (hyperalgesia). Chronic use of opioids can lead to this. Delayed gastric emptying (the process by which the contents of your stomach are moved into your small intestine). Muscle rigidity. Immune system and hormonal dysfunction. Quick, involuntary muscle jerks (myoclonus). Arrhythmia. Itchy skin (pruritus). Dry mouth (xerostomia).  Long-term side effects: Chronic constipation. Sleep-disordered breathing (SDB). Increased risk of bone fractures. Hypothalamic-pituitary-adrenal dysregulation. Increased risk of overdose.  RISKS: Fractures and Falls:  Opioids increase the risk and incidence of falls. This is of particular importance in elderly patients.  Endocrine System:  Long-term administration is associated with endocrine abnormalities (endocrinopathies). (Also known as Opioid-induced Endocrinopathy) Influences  on both the hypothalamic-pituitary-adrenal axis?and the hypothalamic-pituitary-gonadal axis have been demonstrated with consequent hypogonadism and adrenal insufficiency in both sexes. Hypogonadism and decreased levels of dehydroepiandrosterone sulfate have been reported in men and women. Endocrine effects include: Amenorrhoea in women (abnormal absence of menstruation) Reduced libido in both sexes Decreased sexual function Erectile dysfunction in men Hypogonadisms (decreased testicular function with shrinkage of testicles) Infertility Depression and fatigue Loss of muscle mass Anxiety Depression Immune suppression Hyperalgesia Weight gain Anemia Osteoporosis Patients (particularly women of childbearing age) should avoid opioids. There is insufficient evidence to recommend routine monitoring of asymptomatic patients taking opioids in the long-term for hormonal deficiencies.  Immune System: Human studies have demonstrated that opioids have an immunomodulating effect. These effects are mediated via opioid receptors both on immune effector cells and in the central nervous system. Opioids have been demonstrated to have adverse effects on antimicrobial response and anti-tumour surveillance. Buprenorphine has been demonstrated to have no impact on immune function.  Opioid Induced Hyperalgesia: Human studies have demonstrated that prolonged use of opioids can lead to a state of abnormal pain sensitivity, sometimes called opioid induced hyperalgesia (OIH). Opioid induced hyperalgesia is not usually seen in the absence of tolerance to opioid analgesia. Clinically, hyperalgesia may be diagnosed if the patient on long-term opioid therapy presents with increased pain. This might be qualitatively and anatomically distinct from pain related to disease progression or to breakthrough pain resulting from development of opioid tolerance. Pain associated with hyperalgesia tends to be more diffuse than the  pre-existing pain and less defined in quality. Management of opioid induced hyperalgesia requires opioid dose reduction.  Cancer: Chronic opioid therapy has been associated with an increased risk of cancer among noncancer patients with chronic pain. This association was more evident in chronic strong opioid users. Chronic opioid consumption causes significant pathological changes in the small intestine and colon. Epidemiological studies have found that there is a link between opium dependence and initiation of gastrointestinal cancers. Cancer is the second leading cause of death after cardiovascular disease. Chronic use of opioids can cause multiple conditions such as GERD, immunosuppression and renal damage as well as carcinogenic effects, which are associated with the incidence of cancers.   Mortality: Long-term opioid use has been associated with increased mortality among patients with chronic non-cancer pain (CNCP).  Prescription of long-acting opioids for chronic noncancer pain was associated with a significantly increased risk of all-cause mortality, including deaths from causes other than overdose.  Reference: Von Glenetta Hew,  Kolodny A, Deyo RA, Chou R. Long-term opioid therapy reconsidered. Ann Intern Med. 2011 Sep 6;155(5):325-8. doi: 10.7326/[id number]. PMID: [id number]; PMCIDXX:1631110. Morley Kos, Hayward RA, Dunn KM, [place] KP. Risk of adverse events in patients prescribed long-term opioids: A cohort study in the [place] Clinical Practice Research [employer]. Eur J Pain. 2019 May;23(5):908-922. doi: 10.1002/ejp.1357. Epub 2019 Jan 31. PMID: [id number]. Colameco S, Coren JS, Ciervo CA. Continuous opioid treatment for chronic noncancer pain: a time for moderation in prescribing. Postgrad Med. 2009 Jul;121(4):61-6. doi: 10.3810/pgm.2009.07.2032. PMID: [id number]. Heywood Bene RN, [place] SD, Blazina I, Rosalio Loud, Bougatsos C, Deyo RA. The  effectiveness and risks of long-term opioid therapy for chronic pain: a systematic review for a [employer] of Health Pathways to [employer]. Ann Intern Med. 2015 Feb 17;162(4):276-86. doi: [id number]. PMID: [id number]. Marjory Sneddon [facility], [place] DM. NCHS Data Brief No. 22. [place]: Centers for Disease Control and Prevention; 2009. Sep, Increase in Fatal Poisonings Involving Opioid Analgesics in the [place], 1999-2006. Song IA, Choi HR, Oh TK. Long-term opioid use and mortality in patients with chronic non-cancer pain: Ten-year follow-up study in [place] from 2010 through 2019. EClinicalMedicine. 2022 Jul 18;51:101558. doi: 10.1016/j.eclinm.2022.UB:[id number]. PMID: [id number]; PMCIDOX:8550940. Huser, W., Schubert, T., Vogelmann, T. et al. All-cause mortality in patients with long-term opioid therapy compared with non-opioid analgesics for chronic non-cancer pain: a database study. [place] Med 18, 162 (2020). [email or web address] Rashidian H, Roxy Cedar, Malekzadeh R, Haghdoost AA. An Ecological Study of the Association between Opiate Use and Incidence of Cancers. Addict Health. 2016 Fall;8(4):252-260. PMID: [id number]; PMCIDQI:9185013.  Our Goal: Our goal is to control your pain with means other than the use of opioid pain medications.  Our Recommendation: Talk to your physician about coming off of these medications. We can assist you with the tapering down and stopping these medicines. Based on the new information, even if you cannot completely stop the medication, a decrease in the dose may be associated with a lesser risk. Ask for other means of controlling the pain. Decrease or eliminate those factors that significantly contribute to your pain such as smoking, obesity, and a diet heavily tilted towards "inflammatory" nutrients.  Last Updated: 12/09/2022    ____________________________________________________________________________________________     ____________________________________________________________________________________________  National Pain Medication Shortage  The [place] is experiencing worsening drug shortages. These have had a negative widespread effect on patient care and treatment. Not expected to improve any time soon. Predicted to last past 2029.   Drug shortage list (generic names) Oxycodone IR Oxycodone/APAP Oxymorphone IR Hydromorphone Hydrocodone/APAP Morphine  Where is the problem?  Manufacturing and supply level.  Will this shortage affect you?  Only if you take any of the above pain medications.  How? You may be unable to fill your prescription.  Your pharmacist may offer a "partial fill" of your prescription. (Warning: Do not accept partial fills.) Prescriptions partially filled cannot be transferred to another pharmacy. Read our Medication Rules and Regulation. Depending on how much medicine you are dependent on, you may experience withdrawals when unable to get the medication.  Recommendations: Consider ending your dependence on opioid pain medications. Ask your pain specialist to assist you with the process. Consider switching to a medication currently not in shortage, such as Buprenorphine. Talk to your pain specialist about this option. Consider decreasing your pain medication requirements by managing tolerance thru "Drug Holidays". This may help minimize withdrawals, should you run out of medicine. Control your pain thru  the use of non-pharmacological interventional therapies.   Your prescriber: Prescribers cannot be blamed for shortages. Medication manufacturing and supply issues cannot be fixed by the prescriber.   NOTE: The prescriber is not responsible for supplying the medication, or solving supply issues. Work with your pharmacist to solve it. The patient is responsible for the decision  to take or continue taking the medication and for identifying and securing a legal supply source. By law, supplying the medication is the job and responsibility of the pharmacy. The prescriber is responsible for the evaluation, monitoring, and prescribing of these medications.   Prescribers will NOT: Re-issue prescriptions that have been partially filled. Re-issue prescriptions already sent to a pharmacy.  Re-send prescriptions to a different pharmacy because yours did not have your medication. Ask pharmacist to order more medicine or transfer the prescription to another pharmacy. (Read below.)  New 2023 regulation: "June 12, 2022 Revised Regulation Allows DEA-Registered Pharmacies to Transfer Electronic Prescriptions at a Patient's Request [place] Patients now have the ability to request their electronic prescription be transferred to another pharmacy without having to go back to their practitioner to initiate the request. This revised regulation went into effect on Monday, June 08, 2022.     At a patient's request, a DEA-registered retail pharmacy can now transfer an electronic prescription for a controlled substance (schedules II-V) to another DEA-registered retail pharmacy. Prior to this change, patients would have to go through their practitioner to cancel their prescription and have it re-issued to a different pharmacy. The process was taxing and time consuming for both patients and practitioners.    The Drug Enforcement Administration [name]) published its intent to revise the process for transferring electronic prescriptions on August 30, 2020.  The final rule was published in the federal register on May 07, 2022 and went into effect 30 days later.  Under the final rule, a prescription can only be transferred once between pharmacies, and only if allowed under existing state or other applicable law. The prescription must remain in its  electronic form; may not be altered in any way; and the transfer must be communicated directly between two licensed pharmacists. It's important to note, any authorized refills transfer with the original prescription, which means the entire prescription will be filled at the same pharmacy".  Reference: [email or web address] [facility] website announcement)  [email or web address] ([place])   [employer] / Vol. 88, No. 143 / Thursday, May 07, 2022 / Rules and Regulations DEPARTMENT OF JUSTICE  Drug Enforcement Administration  21 CFR Part 1306  [Docket No. [id number]]  RIN [id number] Transfer of Electronic Prescriptions for Schedules II-V Controlled Substances Between Pharmacies for Initial Filling  ____________________________________________________________________________________________     ____________________________________________________________________________________________  Transfer of Pain Medication between Pharmacies  Re: 2023 DEA Clarification on existing regulation  Published on DEA Website: June 12, 2022  Title: Revised Regulation Allows DEA-Registered Pharmacies to Conservator, museum/gallery Prescriptions at a Patient's Request [place]  "Patients now have the ability to request their electronic prescription be transferred to another pharmacy without having to go back to their practitioner to initiate the request. This revised regulation went into effect on Monday, June 08, 2022.     At a patient's request, a DEA-registered retail pharmacy can now transfer an electronic prescription for a controlled substance (schedules II-V) to another DEA-registered retail pharmacy. Prior to this change, patients would have to go through their practitioner to cancel their  prescription  and have it re-issued to a different pharmacy. The process was taxing and time consuming for both patients and practitioners.    The Drug Enforcement Administration [facility]) published its intent to revise the process for transferring electronic prescriptions on August 30, 2020.  The final rule was published in the federal register on May 07, 2022 and went into effect 30 days later.  Under the final rule, a prescription can only be transferred once between pharmacies, and only if allowed under existing state or other applicable law. The prescription must remain in its electronic form; may not be altered in any way; and the transfer must be communicated directly between two licensed pharmacists. It's important to note, any authorized refills transfer with the original prescription, which means the entire prescription will be filled at the same pharmacy."    REFERENCES: 1. DEA website announcement [email or web address]  2. Department of Justice website  [email or web address]  3. DEPARTMENT OF JUSTICE Drug Enforcement Administration 21 CFR Part 1306 [Docket No. [id number]] RIN [id number]-[id number] "Transfer of Electronic Prescriptions for Schedules II-V Controlled Substances Between Pharmacies for Initial Filling"  ____________________________________________________________________________________________     _______________________________________________________________________  Medication Rules  Purpose: To inform patients, and their family members, of our medication rules and regulations.  Applies to: All patients receiving prescriptions from our practice (written or electronic).  Pharmacy of record: This is the pharmacy where your electronic prescriptions will be sent. Make sure we have the correct one.  Electronic prescriptions: In compliance  with the [place] (STOP) Act of 2017 (Session Lanny Cramp [phone]), effective October 12, 2018, all controlled substances must be electronically prescribed. Written prescriptions, faxing, or calling prescriptions to a pharmacy will no longer be done.  Prescription refills: These will be provided only during in-person appointments. No medications will be renewed without a "face-to-face" evaluation with your provider. Applies to all prescriptions.  NOTE: The following applies primarily to controlled substances (Opioid* Pain Medications).   Type of encounter (visit): For patients receiving controlled substances, face-to-face visits are required. (Not an option and not up to the patient.)  Patient's responsibilities: Pain Pills: Bring all pain pills to every appointment (except for procedure appointments). Pill Bottles: Bring pills in original pharmacy bottle. Bring bottle, even if empty. Always bring the bottle of the most recent fill.  Medication refills: You are responsible for knowing and keeping track of what medications you are taking and when is it that you will need a refill. The day before your appointment: write a list of all prescriptions that need to be refilled. The day of the appointment: give the list to the admitting nurse. Prescriptions will be written only during appointments. No prescriptions will be written on procedure days. If you forget a medication: it will not be "Called in", "Faxed", or "electronically sent". You will need to get another appointment to get these prescribed. No early refills. Do not call asking to have your prescription filled early. Partial  or short prescriptions: Occasionally your pharmacy may not have enough pills to fill your prescription.  NEVER ACCEPT a partial fill or a prescription that is short of the total amount of pills that you were prescribed.  With controlled substances the law allows 72 hours for the pharmacy  to complete the prescription.  If the prescription is not completed within 72 hours, the pharmacist will require a new prescription to be written. This means that you will be short on your medicine and we WILL NOT send another prescription to complete your original  prescription.  Instead, request the pharmacy to send a carrier to a nearby branch to get enough medication to provide you with your full prescription. Prescription Accuracy: You are responsible for carefully inspecting your prescriptions before leaving our office. Have the discharge nurse carefully go over each prescription with you, before taking them home. Make sure that your name is accurately spelled, that your address is correct. Check the name and dose of your medication to make sure it is accurate. Check the number of pills, and the written instructions to make sure they are clear and accurate. Make sure that you are given enough medication to last until your next medication refill appointment. Taking Medication: Take medication as prescribed. When it comes to controlled substances, taking less pills or less frequently than prescribed is permitted and encouraged. Never take more pills than instructed. Never take the medication more frequently than prescribed.  Inform other Doctors: Always inform, all of your healthcare providers, of all the medications you take. Pain Medication from other Providers: You are not allowed to accept any additional pain medication from any other Doctor or Healthcare provider. There are two exceptions to this rule. (see below) In the event that you require additional pain medication, you are responsible for notifying us, as stated below. Cough Medicine: Often these contain an opioid, such as codeine or hydrocodone. Never accept or take cough medicine containing these opioids if you are already taking an opioid* medication. The combination may cause respiratory failure and death. Medication Agreement: You are  responsible for carefully reading and following our Medication Agreement. This must be signed before receiving any prescriptions from our practice. Safely store a copy of your signed Agreement. Violations to the Agreement will result in no further prescriptions. (Additional copies of our Medication Agreement are available upon request.) Laws, Rules, & Regulations: All patients are expected to follow all Federal and [employer], [employer], Rules, [facility]. Ignorance of the Laws does not constitute a valid excuse.  Illegal drugs and Controlled Substances: The use of illegal substances (including, but not limited to marijuana and its derivatives) and/or the illegal use of any controlled substances is strictly prohibited. Violation of this rule may result in the immediate and permanent discontinuation of any and all prescriptions being written by our practice. The use of any illegal substances is prohibited. Adopted CDC guidelines & recommendations: Target dosing levels will be at or below 60 MME/day. Use of benzodiazepines** is not recommended.  Exceptions: There are only two exceptions to the rule of not receiving pain medications from other Healthcare Providers. Exception #1 (Emergencies): In the event of an emergency (i.e.: accident requiring emergency care), you are allowed to receive additional pain medication. However, you are responsible for: As soon as you are able, call our office [phone], at any time of the day or night, and leave a message stating your name, the date and nature of the emergency, and the name and dose of the medication prescribed. In the event that your call is answered by a member of our staff, make sure to document and save the date, time, and the name of the person that took your information.  Exception #2 (Planned Surgery): In the event that you are scheduled by another doctor or dentist to have any type of surgery or procedure, you are allowed (for a period no longer  than 30 days), to receive additional pain medication, for the acute post-op pain. However, in this case, you are responsible for picking up a copy of  our "Post-op Pain Management for Surgeons" handout, and giving it to your surgeon or dentist. This document is available at our office, and does not require an appointment to obtain it. Simply go to our office during business hours (Monday-Thursday from 8:00 AM to 4:00 PM) (Friday 8:00 AM to 12:00 Noon) or if you have a scheduled appointment with [place], prior to your surgery, and ask for it by name. In addition, you are responsible for: calling our office [phone], at any time of the day or night, and leaving a message stating your name, name of your surgeon, type of surgery, and date of procedure or surgery. Failure to comply with your responsibilities may result in termination of therapy involving the controlled substances. Medication Agreement Violation. Following the above rules, including your responsibilities will help you in avoiding a Medication Agreement Violation ("Breaking your Pain Medication Contract").  Consequences:  Not following the above rules may result in permanent discontinuation of medication prescription therapy.  *Opioid medications include: morphine, codeine, oxycodone, oxymorphone, hydrocodone, hydromorphone, meperidine, tramadol, tapentadol, buprenorphine, fentanyl, methadone. **Benzodiazepine medications include: diazepam (Valium), alprazolam (Xanax), clonazepam (Klonopine), lorazepam (Ativan), clorazepate (Tranxene), chlordiazepoxide (Librium), estazolam (Prosom), oxazepam (Serax), temazepam (Restoril), triazolam (Halcion) (Last updated: 08/04/2022) ______________________________________________________________________    ______________________________________________________________________  Medication Recommendations and Reminders  Applies to: All patients receiving prescriptions (written and/or  electronic).  Medication Rules & Regulations: You are responsible for reading, knowing, and following our "Medication Rules" document. These exist for your safety and that of others. They are not flexible and neither are we. Dismissing or ignoring them is an act of "non-compliance" that may result in complete and irreversible termination of such medication therapy. For safety reasons, "non-compliance" will not be tolerated. As with the [place] fundamental legal principle of "ignorance of the law is no defense", we will accept no excuses for not having read and knowing the content of documents provided to you by our practice.  Pharmacy of record:  Definition: This is the pharmacy where your electronic prescriptions will be sent.  We do not endorse any particular pharmacy. It is up to you and your insurance to decide what pharmacy to use.  We do not restrict you in your choice of pharmacy. However, once we write for your prescriptions, we will NOT be re-sending more prescriptions to fix restricted supply problems created by your pharmacy, or your insurance.  The pharmacy listed in the electronic medical record should be the one where you want electronic prescriptions to be sent. If you choose to change pharmacy, simply notify our nursing staff. Changes will be made only during your regular appointments and not over the phone.  Recommendations: Keep all of your pain medications in a safe place, under lock and key, even if you live alone. We will NOT replace lost, stolen, or damaged medication. We do not accept "Police Reports" as proof of medications having been stolen. After you fill your prescription, take 1 week's worth of pills and put them away in a safe place. You should keep a separate, properly labeled bottle for this purpose. The remainder should be kept in the original bottle. Use this as your primary supply, until it runs out. Once it's gone, then you know that you have 1 week's worth of medicine,  and it is time to come in for a prescription refill. If you do this correctly, it is unlikely that you will ever run out of medicine. To make sure that the above recommendation works, it is very important that you make  sure your medication refill appointments are scheduled at least 1 week before you run out of medicine. To do this in an effective manner, make sure that you do not leave the office without scheduling your next medication management appointment. Always ask the nursing staff to show you in your prescription , when your medication will be running out. Then arrange for the receptionist to get you a return appointment, at least 7 days before you run out of medicine. Do not wait until you have 1 or 2 pills left, to come in. This is very poor planning and does not take into consideration that we may need to cancel appointments due to bad weather, sickness, or emergencies affecting our staff. DO NOT ACCEPT A "Partial Fill": If for any reason your pharmacy does not have enough pills/tablets to completely fill or refill your prescription, do not allow for a "partial fill". The law allows the pharmacy to complete that prescription within 72 hours, without requiring a new prescription. If they do not fill the rest of your prescription within those 72 hours, you will need a separate prescription to fill the remaining amount, which we will NOT provide. If the reason for the partial fill is your insurance, you will need to talk to the pharmacist about payment alternatives for the remaining tablets, but again, DO NOT ACCEPT A PARTIAL FILL, unless you can trust your pharmacist to obtain the remainder of the pills within 72 hours.  Prescription refills and/or changes in medication(s):  Prescription refills, and/or changes in dose or medication, will be conducted only during scheduled medication management appointments. (Applies to both, written and electronic prescriptions.) No refills on procedure days. No  medication will be changed or started on procedure days. No changes, adjustments, and/or refills will be conducted on a procedure day. Doing so will interfere with the diagnostic portion of the procedure. No phone refills. No medications will be "called into the pharmacy". No Fax refills. No weekend refills. No Holliday refills. No after hours refills.  Remember:  Business hours are:  Monday to Thursday 8:00 AM to 4:00 PM Provider's Schedule: Milinda Pointer, MD - Appointments are:  Medication management: Monday and Wednesday 8:00 AM to 4:00 PM Procedure day: Tuesday and Thursday 7:30 AM to 4:00 PM Gillis Santa, MD - Appointments are:  Medication management: Tuesday and Thursday 8:00 AM to 4:00 PM Procedure day: Monday and Wednesday 7:30 AM to 4:00 PM (Last update: 08/04/2022) ______________________________________________________________________    ____________________________________________________________________________________________  Drug Holidays  What is a "Drug Holiday"? Drug Holiday: is the name given to the process of slowly tapering down and temporarily stopping the pain medication for the purpose of decreasing or eliminating tolerance to the drug.  Benefits Improved effectiveness Decreased required effective dose Improved pain control End dependence on high dose therapy Decrease cost of therapy Uncovering "opioid-induced hyperalgesia". (OIH)  What is "opioid hyperalgesia"? It is a paradoxical increase in pain caused by exposure to opioids. Stopping the opioid pain medication, contrary to the expected, it actually decreases or completely eliminates the pain. Ref.: "A comprehensive review of opioid-induced hyperalgesia". Brion Aliment, et.al. Pain Physician. 2011 Mar-Apr;14(2):145-61.  What is tolerance? Tolerance: the progressive loss of effectiveness of a pain medicine due to repetitive use. A common problem of opioid pain medications.  How long should a "Drug  Holiday" last? Effectiveness depends on the patient staying off all opioid pain medicines for a minimum of 14 consecutive days. (2 weeks)  How about just taking less of the medicine? Does not  work. Will not accomplish goal of eliminating the excess receptors.  How about switching to a different pain medicine? (AKA. "Opioid rotation") Does not work. Creates the illusion of effectiveness by taking advantage of inaccurate equivalent dose calculations between different opioids. -This "technique" was promoted by studies funded by [employer], such as [employer], creators of "OxyContin".  Can I stop the medicine "cold [place]"? Depends. You should always coordinate with your Pain Specialist to make the transition as smoothly as possible. Avoid stopping the medicine abruptly without consulting. We recommend a "slow taper".  What is a slow taper? Taper: refers to the gradual decrease in dose.   How do I stop/taper the dose? Slowly. Decrease the daily amount of pills that you take by one (1) pill every seven (7) days. This is called a "slow downward taper". Example: if you normally take four (4) pills per day, drop it to three (3) pills per day for seven (7) days, then to two (2) pills per day for seven (7) days, then to one (1) per day for seven (7) days, and then stop the medicine. The 14 day "Drug Holiday" starts on the first day without medicine.   Will I experience withdrawals? Unlikely with a slow taper.  What triggers withdrawals? Withdrawals are triggered by the sudden/abrupt stop of high dose opioids. Withdrawals can be avoided by slowly decreasing the dose over a prolonged period of time.  What are withdrawals? Symptoms associated with sudden/abrupt reduction/stopping of high-dose, long-term use of pain medication. Withdrawal are seldom seen on low dose therapy, or patients rarely taking opioid medication.  Early Withdrawal Symptoms may include: Agitation Anxiety Muscle  aches Increased tearing Insomnia Runny nose Sweating Yawning  Late symptoms may include: Abdominal cramping Diarrhea Dilated pupils Goose bumps Nausea Vomiting  (Last update: 09/20/2022) ____________________________________________________________________________________________    ____________________________________________________________________________________________  WARNING: CBD (cannabidiol) & Delta (Delta-8 tetrahydrocannabinol) products.   Applicable to:  All individuals currently taking or considering taking CBD (cannabidiol) and, more important, all patients taking opioid analgesic controlled substances (pain medication). (Example: oxycodone; oxymorphone; hydrocodone; hydromorphone; morphine; methadone; tramadol; tapentadol; fentanyl; buprenorphine; butorphanol; dextromethorphan; meperidine; codeine; etc.)  Introduction:  Recently there has been a drive towards the use of "natural" products for the treatment of different conditions, including pain anxiety and sleep disorders. Marijuana and hemp are two varieties of the cannabis genus plants. Marijuana and its derivatives are illegal, while hemp and its derivatives are not. Cannabidiol (CBD) and tetrahydrocannabinol (THC), are two natural compounds found in plants of the Cannabis genus. They can both be extracted from hemp or marijuana. Both compounds interact with your body's endocannabinoid system in very different ways. CBD is associated with pain relief (analgesia) while THC is associated with the psychoactive effects ("the high") obtained from the use of marijuana products. There are two main types of THC: Delta-9, which comes from the marijuana plant and it is illegal, and Delta-8, which comes from the hemp plant, and it is legal. (Both, Delta-9-THC and Delta-8-THC are psychoactive and give you "the high".)   Legality:  Marijuana and its derivatives: illegal Hemp and its derivatives: Legal (State dependent) UPDATE:  (11/28/2021) The Drug Enforcement Agency ([place]) issued a letter stating that "delta" cannabinoids, including Delta-8-THCO and Delta-9-THCO, synthetically derived from hemp do not qualify as hemp and will be viewed as Schedule I drugs. (Schedule I drugs, substances, or chemicals are defined as drugs with no currently accepted medical use and a high potential for abuse. Some examples of Schedule I drugs are: heroin,  lysergic acid diethylamide (LSD), marijuana (cannabis), 3,4-methylenedioxymethamphetamine (ecstasy), methaqualone, and peyote.) (https://jennings.com/)  Legal status of CBD in [place]:  "Conditionally Legal"  Reference: "FDA Regulation of Cannabis and Cannabis-Derived Products, Including Cannabidiol (CBD)" - SeekArtists.com.pt  Warning:  CBD is not FDA approved and has not undergo the same manufacturing controls as prescription drugs.  This means that the purity and safety of available CBD may be questionable. Most of the time, despite manufacturer's claims, it is contaminated with THC (delta-9-tetrahydrocannabinol - the chemical in marijuana responsible for the "HIGH").  When this is the case, the [facility] contaminant will trigger a positive urine drug screen (UDS) test for Marijuana (carboxy-THC).   The FDA recently put out a warning about 5 things that everyone should be aware of regarding Delta-8 THC: Delta-8 THC products have not been evaluated or approved by the FDA for safe use and may be marketed in ways that put the public health at risk. The FDA has received adverse event reports involving delta-8 THC-containing products. Delta-8 THC has psychoactive and intoxicating effects. Delta-8 THC manufacturing often involve use of potentially harmful chemicals to create the concentrations of delta-8 THC claimed in the marketplace. The final delta-8 THC product may have potentially harmful  by-products (contaminants) due to the chemicals used in the process. Manufacturing of delta-8 THC products may occur in uncontrolled or unsanitary settings, which may lead to the presence of unsafe contaminants or other potentially harmful substances. Delta-8 THC products should be kept out of the reach of children and pets.  NOTE: Because a positive UDS for any illicit substance is a violation of our medication agreement, your opioid analgesics (pain medicine) may be permanently discontinued.  MORE ABOUT CBD  General Information: CBD was discovered in 20 and it is a derivative of the cannabis sativa genus plants (Marijuana and Hemp). It is one of the 113 identified substances found in Marijuana. It accounts for up to 40% of the plant's extract. As of 2018, preliminary clinical studies on CBD included research for the treatment of anxiety, movement disorders, and pain. CBD is available and consumed in multiple forms, including inhalation of smoke or vapor, as an aerosol spray, and by mouth. It may be supplied as an oil containing CBD, capsules, dried cannabis, or as a liquid solution. CBD is thought not to be as psychoactive as THC (delta-9-tetrahydrocannabinol - the chemical in marijuana responsible for the "HIGH"). Studies suggest that CBD may interact with different biological target receptors in the body, including cannabinoid and other neurotransmitter receptors. As of 2018 the mechanism of action for its biological effects has not been determined.  Side-effects  Adverse reactions: Dry mouth, diarrhea, decreased appetite, fatigue, drowsiness, malaise, weakness, sleep disturbances, and others.  Drug interactions:  CBD may interact with medications such as blood-thinners. CBD causes drowsiness on its own and it will increase drowsiness caused by other medications, including antihistamines (such as Benadryl), benzodiazepines (Xanax, Ativan, Valium), antipsychotics, antidepressants, opioids, alcohol  and supplements such as kava, melatonin and St. John's Wort.  Other drug interactions: Brivaracetam (Briviact); Caffeine; Carbamazepine (Tegretol); Citalopram (Celexa); Clobazam (Onfi); Eslicarbazepine (Aptiom); Everolimus (Zostress); Lithium; Methadone (Dolophine); Rufinamide (Banzel); Sedative medications (CNS depressants); Sirolimus (Rapamune); Stiripentol (Diacomit); Tacrolimus (Prograf); Tamoxifen ; Soltamox); Topiramate (Topamax); Valproate; Warfarin (Coumadin); Zonisamide. (Last update: 09/21/2022) ____________________________________________________________________________________________   ____________________________________________________________________________________________  Naloxone Nasal Spray  Why am I receiving this medication? [place] STOP ACT requires that all patients taking high dose opioids or at risk of opioids respiratory depression, be prescribed an opioid reversal agent, such as  Naloxone (AKA: Narcan).  What is this medication? NALOXONE (nal OX one) treats opioid overdose, which causes slow or shallow breathing, severe drowsiness, or trouble staying awake. Call emergency services after using this medication. You may need additional treatment. Naloxone works by reversing the effects of opioids. It belongs to a group of medications called opioid blockers.  COMMON BRAND NAME(S): Kloxxado, Narcan  What should I tell my care team before I take this medication? They need to know if you have any of these conditions: Heart disease Substance use disorder An unusual or allergic reaction to naloxone, other medications, foods, dyes, or preservatives Pregnant or trying to get pregnant Breast-feeding  When to use this medication? This medication is to be used for the treatment of respiratory depression (less than 8 breaths per minute) secondary to opioid overdose.   How to use this medication? This medication is for use in the nose. Lay the person on their back.  Support their neck with your hand and allow the head to tilt back before giving the medication. The nasal spray should be given into 1 nostril. After giving the medication, move the person onto their side. Do not remove or test the nasal spray until ready to use. Get emergency medical help right away after giving the first dose of this medication, even if the person wakes up. You should be familiar with how to recognize the signs and symptoms of a narcotic overdose. If more doses are needed, give the additional dose in the other nostril. Talk to your care team about the use of this medication in children. While this medication may be prescribed for children as young as newborns for selected conditions, precautions do apply.  Naloxone Overdosage: If you think you have taken too much of this medicine contact a poison control center or emergency room at once.  NOTE: This medicine is only for you. Do not share this medicine with others.  What if I miss a dose? This does not apply.  What may interact with this medication? This is only used during an emergency. No interactions are expected during emergency use. This list may not describe all possible interactions. Give your health care provider a list of all the medicines, herbs, non-prescription drugs, or dietary supplements you use. Also tell them if you smoke, drink alcohol, or use illegal drugs. Some items may interact with your medicine.  What should I watch for while using this medication? Keep this medication ready for use in the case of an opioid overdose. Make sure that you have the phone number of your care team and local hospital ready. You may need to have additional doses of this medication. Each nasal spray contains a single dose. Some emergencies may require additional doses. After use, bring the treated person to the nearest hospital or call 911. Make sure the treating care team knows that the person has received a dose of this medication.  You will receive additional instructions on what to do during and after use of this medication before an emergency occurs.  What side effects may I notice from receiving this medication? Side effects that you should report to your care team as soon as possible: Allergic reactions--skin rash, itching, hives, swelling of the face, lips, tongue, or throat Side effects that usually do not require medical attention (report these to your care team if they continue or are bothersome): Constipation Dryness or irritation inside the nose Headache Increase in blood pressure Muscle spasms Stuffy nose Toothache This list may not  describe all possible side effects. Call your doctor for medical advice about side effects. You may report side effects to FDA at 1-800-FDA-1088.  Where should I keep my medication? Because this is an emergency medication, you should keep it with you at all times.  Keep out of the reach of children and pets. Store between 20 and 25 degrees C (68 and 77 degrees F). Do not freeze. Throw away any unused medication after the expiration date. Keep in original box until ready to use.  NOTE: This sheet is a summary. It may not cover all possible information. If you have questions about this medicine, talk to your doctor, pharmacist, or health care provider.   2023 Elsevier/Gold Standard (2021-06-06 00:00:00)  ____________________________________________________________________________________________

## 2022-12-15 NOTE — Progress Notes (Unsigned)
PROVIDER NOTE: Information contained herein reflects review and annotations entered in association with encounter. Interpretation of such information and data should be left to medically-trained personnel. Information provided to patient can be located elsewhere in the medical record under "Patient Instructions". Document created using STT-dictation technology, any transcriptional errors that may result from process are unintentional.    Patient: Elizabeth [name]  Service Category: E/M  Provider: Gaspar Cola, MD  DOB: [birth year]  DOS: 12/16/2022  Referring Provider: Bunnie Pion, FNP  MRN: [id number]  Specialty: Interventional Pain Management  PCP: Bunnie Pion, FNP  Type: Established Patient  Setting: Ambulatory outpatient    Location: Office  Delivery: Face-to-face     HPI  Elizabeth [name], a 62 y.o. year old female, is here today because of her Chronic pain syndrome [G89.4]. Elizabeth [name]'s primary complain today is No chief complaint on file. Last encounter: My last encounter with her was on 12/01/2022. Pertinent problems: Elizabeth [name] has Chronic low back pain (Bilateral) w/o sciatica; Chronic pain syndrome; Presence of spinal cord stimulator; Chronic lower extremity pain (1ry area of Pain) (Bilateral); Lumbar facet syndrome (Bilateral); Lumbar spondylosis; Complex regional pain syndrome type 1 of upper extremities (Bilateral); Complex regional pain syndrome type 1 of lower extremities (Bilateral); and Bilateral leg edema on their pertinent problem list. Pain Assessment: Severity of   is reported as a  /10. Location:    / . Onset:  . Quality:  . Timing:  . Modifying factor(s):  Marland Kitchen Vitals:  vitals were not taken for this visit.  BMI: Estimated body mass index is 31.59 kg/m as calculated from the following:   Height as of 09/16/22: '4\' 9"'$  (1.448 m).   Weight as of 09/16/22: 146 lb (66.2 kg).  Reason for encounter: medication management. ***  RTCB: 03/21/2023    Pharmacotherapy Assessment  Analgesic: Oxycodone IR 5 mg, 1 tab PO q 6 hrs (20 mg/day of oxycodone) MME/day: 30 mg/day.   Monitoring: [place] PMP: PDMP reviewed during this encounter.       Pharmacotherapy: No side-effects or adverse reactions reported. Compliance: No problems identified. Effectiveness: Clinically acceptable.  No notes on file  No results found for: "CBDTHCR" No results found for: "D8THCCBX" No results found for: "D9THCCBX"  UDS:  Summary  Date Value Ref Range Status  03/11/2022 Note  Final    Comment:    ==================================================================== [facility] (MW) ==================================================================== Test                             Result       Flag       Units  Drug Present and Declared for Prescription Verification   Hydrocodone                    3003         EXPECTED   ng/mg creat   Hydromorphone                  792          EXPECTED   ng/mg creat   Dihydrocodeine                 649          EXPECTED   ng/mg creat   Norhydrocodone                 5814         EXPECTED  ng/mg creat    Sources of hydrocodone include scheduled prescription medications.    Hydromorphone, dihydrocodeine and norhydrocodone are expected    metabolites of hydrocodone. Hydromorphone and dihydrocodeine are    also available as scheduled prescription medications.  Drug Present not Declared for Prescription Verification   Amphetamine                    300          UNEXPECTED ng/mg creat    Amphetamine is available as a schedule II prescription drug.    Oxazepam                       247          UNEXPECTED ng/mg creat    Oxazepam may be administered as a scheduled prescription medication;    it is also an expected metabolite of other benzodiazepine drugs,    including diazepam, chlordiazepoxide, prazepam, clorazepate,    halazepam, and  temazepam.  ==================================================================== Test                      Result    Flag   Units      Ref Range   Creatinine              78               mg/dL      >=20 ==================================================================== Declared Medications:  The flagging and interpretation on this report are based on the  following declared medications.  Unexpected results may arise from  inaccuracies in the declared medications.   **Note: The testing scope of this panel includes these medications:   Hydrocodone (Norco)   **Note: The testing scope of this panel does not include the  following reported medications:   Acetaminophen (Norco)  Albuterol (Ventolin HFA)  Albuterol (Duoneb)  Atorvastatin (Lipitor)  Carvedilol (Coreg)  Formoterol (Dulera)  Furosemide (Lasix)  Hydroxyzine (Atarax)  Ipratropium (Duoneb)  Mometasone (Dulera)  Naloxone (Narcan)  Potassium (Klor-Con)  Spironolactone (Aldactone) ==================================================================== For clinical consultation, please call [phone]. ====================================================================       ROS  Constitutional: Denies any fever or chills Gastrointestinal: No reported hemesis, hematochezia, vomiting, or acute GI distress Musculoskeletal: Denies any acute onset joint swelling, redness, loss of ROM, or weakness Neurological: No reported episodes of acute onset apraxia, aphasia, dysarthria, agnosia, amnesia, paralysis, loss of coordination, or loss of consciousness  Medication Review  HYDROcodone-acetaminophen, albuterol, atorvastatin, carvedilol, mometasone-formoterol, naloxone, pantoprazole, potassium chloride SA, spironolactone, and torsemide  History Review  Allergy: Elizabeth [name] is allergic to lisinopril. Drug: Elizabeth [name]  reports no history of drug use. Alcohol:  reports no history of alcohol use. Tobacco:  reports that she  quit smoking about 4 years ago. Her smoking use included cigarettes. She has a 60.00 pack-year smoking history. She has never used smokeless tobacco. Social: Elizabeth [name]  reports that she quit smoking about 4 years ago. Her smoking use included cigarettes. She has a 60.00 pack-year smoking history. She has never used smokeless tobacco. She reports that she does not drink alcohol and does not use drugs. Medical:  has a past medical history of Anxiety, Arm pain (07/25/2015), CHF (congestive heart failure) ([place]), Congestive heart failure ([place]) (07/25/2015), COPD (chronic obstructive pulmonary disease) ([place]), Depression, Hypertension, Lower extremity pain (07/25/2015), Reflux, RSD (reflex sympathetic dystrophy), Spinal cord stimulator status, Vitamin B12 deficiency, and Vitamin D deficiency. Surgical: Elizabeth [name]  has a past surgical  history that includes Spinal cord stimulator implant. Family: family history includes Cancer in her father; Cirrhosis in her mother.  Laboratory Chemistry Profile   Renal Lab Results  Component Value Date   BUN 19 10/03/2022   CREATININE 1.01 (H) 10/03/2022   BCR 17 08/30/2017   GFRAA >60 08/18/2018   GFRNONAA >60 10/03/2022    Hepatic Lab Results  Component Value Date   AST 15 05/21/2022   ALT 20 05/21/2022   ALBUMIN 3.9 05/21/2022   ALKPHOS 83 05/21/2022    Electrolytes Lab Results  Component Value Date   NA 140 10/03/2022   K 4.6 10/03/2022   CL 103 10/03/2022   CALCIUM 9.0 10/03/2022   MG 2.8 (H) 05/20/2022   PHOS 3.4 04/20/2021    Bone Lab Results  Component Value Date   VD25OH 25.3 (L) 03/18/2016   VD125OH2TOT 76.2 03/18/2016   25OHVITD1 37 08/30/2017   25OHVITD2 1.0 08/30/2017   25OHVITD3 36 08/30/2017    Inflammation (CRP: Acute Phase) (ESR: Chronic Phase) Lab Results  Component Value Date   CRP 4.4 08/30/2017   ESRSEDRATE 30 08/30/2017         Note: Above Lab results reviewed.  Recent Imaging Review  DG Chest 2 View CLINICAL  DATA:  Chest pain  EXAM: CHEST - 2 VIEW  COMPARISON:  Radiographs 07/29/2022  FINDINGS: Stable cardiomediastinal silhouette at the upper limits of normal. Aortic atherosclerotic calcification. Bibasilar atelectasis/scarring. No focal pneumonia, pleural effusion, or pneumothorax. Spinal cord stimulator leads with tips overlying the lower cervical spine. No displaced rib fractures.  IMPRESSION: No active cardiopulmonary disease.  Electronically Signed   By: Placido Sou M.D.   On: 10/03/2022 19:20 Note: Reviewed        Physical Exam  General appearance: Well nourished, well developed, and well hydrated. In no apparent acute distress Mental status: Alert, oriented x 3 (person, place, & time)       Respiratory: No evidence of acute respiratory distress Eyes: PERLA Vitals: There were no vitals taken for this visit. BMI: Estimated body mass index is 31.59 kg/m as calculated from the following:   Height as of 09/16/22: '4\' 9"'$  (1.448 m).   Weight as of 09/16/22: 146 lb (66.2 kg). Ideal: Patient weight not recorded  Assessment   Diagnosis Status  1. Chronic pain syndrome   2. Chronic lower extremity pain (1ry area of Pain) (Bilateral)   3. Chronic low back pain (Bilateral) w/o sciatica   4. Complex regional pain syndrome type 1 of lower extremities (Bilateral)   5. Lumbar spondylosis   6. Lumbar facet syndrome (Bilateral)   7. Complex regional pain syndrome type 1 of upper extremities (Bilateral)   8. Pharmacologic therapy   9. Chronic use of opiate for therapeutic purpose   10. Encounter for medication management   11. Encounter for chronic pain management    Controlled Controlled Controlled   Updated Problems: No problems updated.  Plan of Care  Problem-specific:  No problem-specific Assessment & Plan notes found for this encounter.  Elizabeth [name] has a current medication list which includes the following long-term medication(s): albuterol, albuterol,  atorvastatin, carvedilol, hydrocodone-acetaminophen, mometasone-formoterol, and torsemide.  Pharmacotherapy (Medications Ordered): No orders of the defined types were placed in this encounter.  Orders:  No orders of the defined types were placed in this encounter.  Follow-up plan:   No follow-ups on file.      Interventional Therapies  Risk  Complexity Considerations:   Estimated body mass index is  28.13 kg/m as calculated from the following:   Height as of this encounter: '4\' 9"'$  (1.448 m).   Weight as of this encounter: 130 lb (59 kg). WNL   Planned  Pending:      Under consideration:   Diagnostic bilateral lumbar facet MBB  Possible bilateral lumbar sympathetic RFA    Completed:   Therapeutic right stellate ganglion Blk (SGB) (Multiple)  Therapeutic left stellate ganglion Blk (SGB) (Multiple)  Therapeutic right lumbar sympathetic Blk (LSB) (Multiple)  Hospital admission for therapeutic cervical epidural local anesthetic infusion (Multiple)  Hospital admission for therapeutic Lumbar epidural local anesthetic infusion (Multiple)    Therapeutic  Palliative (PRN) options:   Palliative SGB  Palliative  LSB       Recent Visits Date Type Provider Dept  09/16/22 Office Visit Milinda Pointer, MD [facility]  Showing recent visits within past 90 days and meeting all other requirements Future Appointments Date Type Provider Dept  12/16/22 Appointment Milinda Pointer, MD [facility]  Showing future appointments within next 90 days and meeting all other requirements  I discussed the assessment and treatment plan with the patient. The patient was provided an opportunity to ask questions and all were answered. The patient agreed with the plan and demonstrated an understanding of the instructions.  Patient advised to call back or seek an in-person evaluation if the symptoms or condition worsens.  Duration of encounter: *** minutes.  Total time on  encounter, as per AMA guidelines included both the face-to-face and non-face-to-face time personally spent by the physician and/or other qualified health care professional(s) on the day of the encounter (includes time in activities that require the physician or other qualified health care professional and does not include time in activities normally performed by clinical staff). Physician's time may include the following activities when performed: Preparing to see the patient (e.g., pre-charting review of records, searching for previously ordered imaging, lab work, and nerve conduction tests) Review of prior analgesic pharmacotherapies. Reviewing PMP Interpreting ordered tests (e.g., lab work, imaging, nerve conduction tests) Performing post-procedure evaluations, including interpretation of diagnostic procedures Obtaining and/or reviewing separately obtained history Performing a medically appropriate examination and/or evaluation Counseling and educating the patient/family/caregiver Ordering medications, tests, or procedures Referring and communicating with other health care professionals (when not separately reported) Documenting clinical information in the electronic or other health record Independently interpreting results (not separately reported) and communicating results to the patient/ family/caregiver Care coordination (not separately reported)  Note by: Gaspar Cola, MD Date: 12/16/2022; Time: 12:48 PM

## 2022-12-16 ENCOUNTER — Ambulatory Visit: Payer: Medicaid Other | Attending: Pain Medicine | Admitting: Pain Medicine

## 2022-12-16 ENCOUNTER — Encounter: Payer: Self-pay | Admitting: Pain Medicine

## 2022-12-16 VITALS — BP 131/71 | HR 72 | Temp 98.1°F | Ht <= 58 in | Wt 146.0 lb

## 2022-12-16 DIAGNOSIS — Z79891 Long term (current) use of opiate analgesic: Secondary | ICD-10-CM | POA: Diagnosis present

## 2022-12-16 DIAGNOSIS — G8929 Other chronic pain: Secondary | ICD-10-CM | POA: Diagnosis present

## 2022-12-16 DIAGNOSIS — G90513 Complex regional pain syndrome I of upper limb, bilateral: Secondary | ICD-10-CM | POA: Insufficient documentation

## 2022-12-16 DIAGNOSIS — M47816 Spondylosis without myelopathy or radiculopathy, lumbar region: Secondary | ICD-10-CM | POA: Diagnosis present

## 2022-12-16 DIAGNOSIS — G90523 Complex regional pain syndrome I of lower limb, bilateral: Secondary | ICD-10-CM | POA: Diagnosis not present

## 2022-12-16 DIAGNOSIS — M79605 Pain in left leg: Secondary | ICD-10-CM | POA: Diagnosis present

## 2022-12-16 DIAGNOSIS — G894 Chronic pain syndrome: Secondary | ICD-10-CM | POA: Diagnosis present

## 2022-12-16 DIAGNOSIS — M79604 Pain in right leg: Secondary | ICD-10-CM | POA: Insufficient documentation

## 2022-12-16 DIAGNOSIS — Z79899 Other long term (current) drug therapy: Secondary | ICD-10-CM | POA: Diagnosis present

## 2022-12-16 DIAGNOSIS — M545 Low back pain, unspecified: Secondary | ICD-10-CM | POA: Diagnosis present

## 2022-12-16 MED ORDER — HYDROCODONE-ACETAMINOPHEN 5-325 MG PO TABS: 1.0000 | ORAL_TABLET | Freq: Four times a day (QID) | ORAL | 0 refills | Status: DC | PRN

## 2022-12-16 NOTE — Progress Notes (Signed)
Nursing Pain Medication Assessment:  Safety precautions to be maintained throughout the outpatient stay will include: orient to surroundings, keep bed in low position, maintain call bell within reach at all times, provide assistance with transfer out of bed and ambulation.  Medication Inspection Compliance: Pill count conducted under aseptic conditions, in front of the patient. Neither the pills nor the bottle was removed from the patient's sight at any time. Once count was completed pills were immediately returned to the patient in their original bottle.  Medication: Hydrocodone/APAP Pill/Patch Count:  18 of 120 pills remain Pill/Patch Appearance: Markings consistent with prescribed medication Bottle Appearance: Standard pharmacy container. Clearly labeled. Filled Date: 2 / 10 / 2024 Last Medication intake:  Today

## 2023-03-17 ENCOUNTER — Ambulatory Visit: Payer: Medicaid Other | Attending: Pain Medicine | Admitting: Pain Medicine

## 2023-03-17 ENCOUNTER — Encounter: Payer: Self-pay | Admitting: Pain Medicine

## 2023-03-17 VITALS — BP 128/62 | HR 88 | Temp 97.7°F | Resp 16 | Ht <= 58 in | Wt 130.0 lb

## 2023-03-17 DIAGNOSIS — G90513 Complex regional pain syndrome I of upper limb, bilateral: Secondary | ICD-10-CM | POA: Insufficient documentation

## 2023-03-17 DIAGNOSIS — Z79891 Long term (current) use of opiate analgesic: Secondary | ICD-10-CM | POA: Insufficient documentation

## 2023-03-17 DIAGNOSIS — G8929 Other chronic pain: Secondary | ICD-10-CM

## 2023-03-17 DIAGNOSIS — M545 Low back pain, unspecified: Secondary | ICD-10-CM | POA: Diagnosis present

## 2023-03-17 DIAGNOSIS — G894 Chronic pain syndrome: Secondary | ICD-10-CM | POA: Diagnosis present

## 2023-03-17 DIAGNOSIS — M79604 Pain in right leg: Secondary | ICD-10-CM | POA: Insufficient documentation

## 2023-03-17 DIAGNOSIS — Z79899 Other long term (current) drug therapy: Secondary | ICD-10-CM | POA: Diagnosis present

## 2023-03-17 DIAGNOSIS — M79605 Pain in left leg: Secondary | ICD-10-CM | POA: Diagnosis present

## 2023-03-17 DIAGNOSIS — G90523 Complex regional pain syndrome I of lower limb, bilateral: Secondary | ICD-10-CM | POA: Diagnosis present

## 2023-03-17 DIAGNOSIS — M47816 Spondylosis without myelopathy or radiculopathy, lumbar region: Secondary | ICD-10-CM | POA: Diagnosis present

## 2023-03-17 MED ORDER — NALOXONE HCL 4 MG/0.1ML NA LIQD: 1.0000 | NASAL | 0 refills | Status: AC | PRN

## 2023-03-17 MED ORDER — HYDROCODONE-ACETAMINOPHEN 5-325 MG PO TABS: 1.0000 | ORAL_TABLET | Freq: Four times a day (QID) | ORAL | 0 refills | Status: DC | PRN

## 2023-03-17 NOTE — Patient Instructions (Signed)
____________________________________________________________________________________________  Naloxone Nasal Spray  Why am I receiving this medication? [place] STOP ACT requires that all patients taking high dose opioids or at risk of opioids respiratory depression, be prescribed an opioid reversal agent, such as Naloxone (AKA: Narcan).  What is this medication? NALOXONE (nal OX one) treats opioid overdose, which causes slow or shallow breathing, severe drowsiness, or trouble staying awake. Call emergency services after using this medication. You may need additional treatment. Naloxone works by reversing the effects of opioids. It belongs to a group of medications called opioid blockers.  COMMON BRAND NAME(S): Kloxxado, Narcan  What should I tell my care team before I take this medication? They need to know if you have any of these conditions: Heart disease Substance use disorder An unusual or allergic reaction to naloxone, other medications, foods, dyes, or preservatives Pregnant or trying to get pregnant Breast-feeding  When to use this medication? This medication is to be used for the treatment of respiratory depression (less than 8 breaths per minute) secondary to opioid overdose.   How to use this medication? This medication is for use in the nose. Lay the person on their back. Support their neck with your hand and allow the head to tilt back before giving the medication. The nasal spray should be given into 1 nostril. After giving the medication, move the person onto their side. Do not remove or test the nasal spray until ready to use. Get emergency medical help right away after giving the first dose of this medication, even if the person wakes up. You should be familiar with how to recognize the signs and symptoms of a narcotic overdose. If more doses are needed, give the additional dose in the other nostril. Talk to your care team about the use of this medication in children.  While this medication may be prescribed for children as young as newborns for selected conditions, precautions do apply.  Naloxone Overdosage: If you think you have taken too much of this medicine contact a poison control center or emergency room at once.  NOTE: This medicine is only for you. Do not share this medicine with others.  What if I miss a dose? This does not apply.  What may interact with this medication? This is only used during an emergency. No interactions are expected during emergency use. This list may not describe all possible interactions. Give your health care provider a list of all the medicines, herbs, non-prescription drugs, or dietary supplements you use. Also tell them if you smoke, drink alcohol, or use illegal drugs. Some items may interact with your medicine.  What should I watch for while using this medication? Keep this medication ready for use in the case of an opioid overdose. Make sure that you have the phone number of your care team and local hospital ready. You may need to have additional doses of this medication. Each nasal spray contains a single dose. Some emergencies may require additional doses. After use, bring the treated person to the nearest hospital or call 911. Make sure the treating care team knows that the person has received a dose of this medication. You will receive additional instructions on what to do during and after use of this medication before an emergency occurs.  What side effects may I notice from receiving this medication? Side effects that you should report to your care team as soon as possible: Allergic reactions--skin rash, itching, hives, swelling of the face, lips, tongue, or throat Side effects that usually   do not require medical attention (report these to your care team if they continue or are bothersome): Constipation Dryness or irritation inside the nose Headache Increase in blood pressure Muscle spasms Stuffy  nose Toothache This list may not describe all possible side effects. Call your doctor for medical advice about side effects. You may report side effects to FDA at 1-800-FDA-1088.  Where should I keep my medication? Because this is an emergency medication, you should keep it with you at all times.  Keep out of the reach of children and pets. Store between 20 and 25 degrees C (68 and 77 degrees F). Do not freeze. Throw away any unused medication after the expiration date. Keep in original box until ready to use.  NOTE: This sheet is a summary. It may not cover all possible information. If you have questions about this medicine, talk to your doctor, pharmacist, or health care provider.   2023 Elsevier/Gold Standard (2021-06-06 00:00:00)  ____________________________________________________________________________________________   ____________________________________________________________________________________________  Opioid Pain Medication Update  To: All patients taking opioid pain medications. (I.e.: hydrocodone, hydromorphone, oxycodone, oxymorphone, morphine, codeine, methadone, tapentadol, tramadol, buprenorphine, fentanyl, etc.)  Re: Updated review of side effects and adverse reactions of opioid analgesics, as well as new information about long term effects of this class of medications.  Direct risks of long-term opioid therapy are not limited to opioid addiction and overdose. Potential medical risks include serious fractures, breathing problems during sleep, hyperalgesia, immunosuppression, chronic constipation, bowel obstruction, myocardial infarction, and tooth decay secondary to xerostomia.  Unpredictable adverse effects that can occur even if you take your medication correctly: Cognitive impairment, respiratory depression, and death. Most people think that if they take their medication "correctly", and "as instructed", that they will be safe. Nothing could be farther from the  truth. In reality, a significant amount of recorded deaths associated with the use of opioids has occurred in individuals that had taken the medication for a long time, and were taking their medication correctly. The following are examples of how this can happen: Patient taking his/her medication for a long time, as instructed, without any side effects, is given a certain antibiotic or another unrelated medication, which in turn triggers a "Drug-to-drug interaction" leading to disorientation, cognitive impairment, impaired reflexes, respiratory depression or an untoward event leading to serious bodily harm or injury, including death.  Patient taking his/her medication for a long time, as instructed, without any side effects, develops an acute impairment of liver and/or kidney function. This will lead to a rapid inability of the body to breakdown and eliminate their pain medication, which will result in effects similar to an "overdose", but with the same medicine and dose that they had always taken. This again may lead to disorientation, cognitive impairment, impaired reflexes, respiratory depression or an untoward event leading to serious bodily harm or injury, including death.  A similar problem will occur with patients as they grow older and their liver and kidney function begins to decrease as part of the aging process.  Background information: Historically, the original case for using long-term opioid therapy to treat chronic noncancer pain was based on safety assumptions that subsequent experience has called into question. In 1996, the American Pain Society and the American Academy of Pain Medicine issued a consensus statement supporting long-term opioid therapy. This statement acknowledged the dangers of opioid prescribing but concluded that the risk for addiction was low; respiratory depression induced by opioids was short-lived, occurred mainly in opioid-naive patients, and was antagonized by pain;  tolerance   was not a common problem; and efforts to control diversion should not constrain opioid prescribing. This has now proven to be wrong. Experience regarding the risks for opioid addiction, misuse, and overdose in community practice has failed to support these assumptions.  According to the Centers for Disease Control and Prevention, fatal overdoses involving opioid analgesics have increased sharply over the past decade. Currently, more than 96,700 people die from drug overdoses every year. Opioids are a factor in 7 out of every 10 overdose deaths. Deaths from drug overdose have surpassed motor vehicle accidents as the leading cause of death for individuals between the ages of 35 and 54.  Clinical data suggest that neuroendocrine dysfunction may be very common in both men and women, potentially causing hypogonadism, erectile dysfunction, infertility, decreased libido, osteoporosis, and depression. Recent studies linked higher opioid dose to increased opioid-related mortality. Controlled observational studies reported that long-term opioid therapy may be associated with increased risk for cardiovascular events. Subsequent meta-analysis concluded that the safety of long-term opioid therapy in elderly patients has not been proven.   Side Effects and adverse reactions: Common side effects: Drowsiness (sedation). Dizziness. Nausea and vomiting. Constipation. Physical dependence -- Dependence often manifests with withdrawal symptoms when opioids are discontinued or decreased. Tolerance -- As you take repeated doses of opioids, you require increased medication to experience the same effect of pain relief. Respiratory depression -- This can occur in healthy people, especially with higher doses. However, people with COPD, asthma or other lung conditions may be even more susceptible to fatal respiratory impairment.  Uncommon side effects: An increased sensitivity to feeling pain and extreme response to  pain (hyperalgesia). Chronic use of opioids can lead to this. Delayed gastric emptying (the process by which the contents of your stomach are moved into your small intestine). Muscle rigidity. Immune system and hormonal dysfunction. Quick, involuntary muscle jerks (myoclonus). Arrhythmia. Itchy skin (pruritus). Dry mouth (xerostomia).  Long-term side effects: Chronic constipation. Sleep-disordered breathing (SDB). Increased risk of bone fractures. Hypothalamic-pituitary-adrenal dysregulation. Increased risk of overdose.  RISKS: Fractures and Falls:  Opioids increase the risk and incidence of falls. This is of particular importance in elderly patients.  Endocrine System:  Long-term administration is associated with endocrine abnormalities (endocrinopathies). (Also known as Opioid-induced Endocrinopathy) Influences on both the hypothalamic-pituitary-adrenal axis?and the hypothalamic-pituitary-gonadal axis have been demonstrated with consequent hypogonadism and adrenal insufficiency in both sexes. Hypogonadism and decreased levels of dehydroepiandrosterone sulfate have been reported in men and women. Endocrine effects include: Amenorrhoea in women (abnormal absence of menstruation) Reduced libido in both sexes Decreased sexual function Erectile dysfunction in men Hypogonadisms (decreased testicular function with shrinkage of testicles) Infertility Depression and fatigue Loss of muscle mass Anxiety Depression Immune suppression Hyperalgesia Weight gain Anemia Osteoporosis Patients (particularly women of childbearing age) should avoid opioids. There is insufficient evidence to recommend routine monitoring of asymptomatic patients taking opioids in the long-term for hormonal deficiencies.  Immune System: Human studies have demonstrated that opioids have an immunomodulating effect. These effects are mediated via opioid receptors both on immune effector cells and in the central  nervous system. Opioids have been demonstrated to have adverse effects on antimicrobial response and anti-tumour surveillance. Buprenorphine has been demonstrated to have no impact on immune function.  Opioid Induced Hyperalgesia: Human studies have demonstrated that prolonged use of opioids can lead to a state of abnormal pain sensitivity, sometimes called opioid induced hyperalgesia (OIH). Opioid induced hyperalgesia is not usually seen in the absence of tolerance to opioid analgesia. Clinically, hyperalgesia may be   diagnosed if the patient on long-term opioid therapy presents with increased pain. This might be qualitatively and anatomically distinct from pain related to disease progression or to breakthrough pain resulting from development of opioid tolerance. Pain associated with hyperalgesia tends to be more diffuse than the pre-existing pain and less defined in quality. Management of opioid induced hyperalgesia requires opioid dose reduction.  Cancer: Chronic opioid therapy has been associated with an increased risk of cancer among noncancer patients with chronic pain. This association was more evident in chronic strong opioid users. Chronic opioid consumption causes significant pathological changes in the small intestine and colon. Epidemiological studies have found that there is a link between opium dependence and initiation of gastrointestinal cancers. Cancer is the second leading cause of death after cardiovascular disease. Chronic use of opioids can cause multiple conditions such as GERD, immunosuppression and renal damage as well as carcinogenic effects, which are associated with the incidence of cancers.   Mortality: Long-term opioid use has been associated with increased mortality among patients with chronic non-cancer pain (CNCP).  Prescription of long-acting opioids for chronic noncancer pain was associated with a significantly increased risk of all-cause mortality, including deaths  from causes other than overdose.  Reference: Von Korff M, Kolodny A, Deyo RA, Chou R. Long-term opioid therapy reconsidered. Ann Intern Med. 2011 Sep 6;155(5):325-8. doi: 10.7326/[id number]. PMID: 21893626; PMCID: PMC3280085. Bedson J, Chen Y, Ashworth J, Hayward RA, Dunn KM, Jordan KP. Risk of adverse events in patients prescribed long-term opioids: A cohort study in the [place] Clinical Practice Research D[employer]. Eur J Pain. 2019 May;23(5):908-922. doi: 10.1002/ejp.1357. Epub 2019 Jan 31. PMID: 30620116. Colameco S, Coren JS, Ciervo CA. Continuous opioid treatment for chronic noncancer pain: a time for moderation in prescribing. Postgrad Med. 2009 Jul;121(4):61-6. doi: 10.3810/pgm.2009.07.2032. PMID: 19641271. Chou R, Turner JA, Devine EB, Hansen RN, Sullivan SD, Blazina I, Dana T, Bougatsos C, Deyo RA. The effectiveness and risks of long-term opioid therapy for chronic pain: a systematic review for a National Institutes of Health Pathways to Prevention Workshop. Ann Intern Med. 2015 Feb 17;162(4):276-86. doi: 10.7326/M14-2559. PMID: 25581257. Warner M, Chen LH, Makuc DM. NCHS Data Brief No. 22. [place]: Centers for Disease Control and Prevention; 2009. Sep, Increase in Fatal Poisonings Involving Opioid Analgesics in the [place], 1999-2006. Song IA, Choi HR, Oh TK. Long-term opioid use and mortality in patients with chronic non-cancer pain: Ten-year follow-up study in [place] from 2010 through 2019. EClinicalMedicine. 2022 Jul 18;51:101558. doi: 10.1016/j.eclinm.2022.101558. PMID: 35875817; PMCID: PMC9304910. Huser, W., Schubert, T., Vogelmann, T. et al. All-cause mortality in patients with long-term opioid therapy compared with non-opioid analgesics for chronic non-cancer pain: a database study. BMC Med 18, 162 (2020). https://doi.org/10.1186/[email or web address] Rashidian H, Zendehdel K, Kamangar F, Malekzadeh R, Haghdoost AA. An Ecological Study of the Association between  Opiate Use and Incidence of Cancers. Addict Health. 2016 Fall;8(4):252-260. PMID: 28819556; PMCID: [id number].  Our Goal: Our goal is to control your pain with means other than the use of opioid pain medications.  Our Recommendation: Talk to your physician about coming off of these medications. We can assist you with the tapering down and stopping these medicines. Based on the new information, even if you cannot completely stop the medication, a decrease in the dose may be associated with a lesser risk. Ask for other means of controlling the pain. Decrease or eliminate those factors that significantly contribute to your pain such as smoking, obesity, and a diet heavily tilted towards "inflammatory" nutrients.  Last Updated: 12/09/2022   ____________________________________________________________________________________________       ____________________________________________________________________________________________  Transfer of Pain Medication between Pharmacies  Re: 2023 DEA Clarification on existing regulation  Published on DEA Website: June 12, 2022  Title: Revised Regulation Allows DEA-Registered Pharmacies to Transfer Electronic Prescriptions at a Patient's Request DEA Headquarters Division - Public Information Office  "Patients now have the ability to request their electronic prescription be transferred to another pharmacy without having to go back to their practitioner to initiate the request. This revised regulation went into effect on Monday, June 08, 2022.     At a patient's request, a DEA-registered retail pharmacy can now transfer an electronic prescription for a controlled substance (schedules II-V) to another DEA-registered retail pharmacy. Prior to this change, patients would have to go through their practitioner to cancel their prescription and have it re-issued to a different pharmacy. The process was taxing and time consuming for both patients and  practitioners.    The Drug Enforcement Administration (DEA) published its intent to revise the process for transferring electronic prescriptions on August 30, 2020.  The final rule was published in the federal register on May 07, 2022 and went into effect 30 days later.  Under the final rule, a prescription can only be transferred once between pharmacies, and only if allowed under existing state or other applicable law. The prescription must remain in its electronic form; may not be altered in any way; and the transfer must be communicated directly between two licensed pharmacists. It's important to note, any authorized refills transfer with the original prescription, which means the entire prescription will be filled at the same pharmacy."    REFERENCES: 1. DEA website announcement https://www.dea.gov/stories/2023/2023-06/2022-09-01/revised-regulation-allows-dea-registered-pharmacies-transfer  2. Department of Justice website  https://www.govinfo.gov/content/pkg/FR-2022-05-07/pdf/2023-15847.pdf  3. DEPARTMENT OF JUSTICE Drug Enforcement Administration 21 CFR Part 1306 [Docket No. [id number]] RIN [id number] "Transfer of Electronic Prescriptions for Schedules II-V Controlled Substances Between Pharmacies for Initial Filling"  ____________________________________________________________________________________________     

## 2023-03-17 NOTE — Progress Notes (Signed)
PROVIDER NOTE: Information contained herein reflects review and annotations entered in association with encounter. Interpretation of such information and data should be left to medically-trained personnel. Information provided to patient can be located elsewhere in the medical record under "Patient Instructions". Document created using STT-dictation technology, any transcriptional errors that may result from process are unintentional.    Patient: Elizabeth [name]  Service Category: E/M  Provider: Oswaldo Done, MD  DOB: 10/18/1960  DOS: 03/17/2023  Referring Provider: Lorn Junes, FNP  MRN: [id number]  Specialty: Interventional Pain Management  PCP: Lorn Junes, FNP  Type: Established Patient  Setting: Ambulatory outpatient    Location: Office  Delivery: Face-to-face     HPI  Ms. Roshaunda Choma Schlichting, a 62 y.o. year old female, is here today because of her Chronic pain syndrome [G89.4]. Ms. Selinger's primary complain today is Arm Pain (right)  Pertinent problems: Ms. Blash has Chronic low back pain (Bilateral) w/o sciatica; Chronic pain syndrome; Presence of spinal cord stimulator; Chronic lower extremity pain (1ry area of Pain) (Bilateral); Lumbar facet syndrome (Bilateral); Lumbar spondylosis; Complex regional pain syndrome type 1 of upper extremities (Bilateral); Complex regional pain syndrome type 1 of lower extremities (Bilateral); and Bilateral leg edema on their pertinent problem list. Pain Assessment: Severity of Chronic pain is reported as a 3 /10. Location: Arm Right/ . Onset: More than a month ago. Quality: Aching, Nagging. Timing: Constant. Modifying factor(s): nothing. Vitals:  height is 4\' 9"  (1.448 m) and weight is 130 lb (59 kg). Her temperature is 97.7 F (36.5 C). Her blood pressure is 128/62 and her pulse is 88. Her respiration is 16 and oxygen saturation is 95%.  BMI: Estimated body mass index is 28.13 kg/m as calculated from the following:   Height as of this  encounter: 4\' 9"  (1.448 m).   Weight as of this encounter: 130 lb (59 kg). Last encounter: 12/16/2022. Last procedure: Visit date not found.  Reason for encounter: medication management.  The patient indicates doing well with the current medication regimen. No adverse reactions or side effects reported to the medications.   Routine UDS ordered today.   RTCB: 06/19/2023   Pharmacotherapy Assessment  Analgesic: Oxycodone IR 5 mg, 1 tab PO q 6 hrs (20 mg/day of oxycodone) MME/day: 30 mg/day.   Monitoring: [place] PMP: PDMP reviewed during this encounter.       Pharmacotherapy: No side-effects or adverse reactions reported. Compliance: No problems identified. Effectiveness: Clinically acceptable.  Valerie Salts, RN  03/17/2023  2:50 PM  Sign when Signing Visit Nursing Pain Medication Assessment:  Safety precautions to be maintained throughout the outpatient stay will include: orient to surroundings, keep bed in low position, maintain call bell within reach at all times, provide assistance with transfer out of bed and ambulation.  Medication Inspection Compliance: Pill count conducted under aseptic conditions, in front of the patient. Neither the pills nor the bottle was removed from the patient's sight at any time. Once count was completed pills were immediately returned to the patient in their original bottle.  Medication: Hydrocodone/APAP Pill/Patch Count:  18 of 120 pills remain Pill/Patch Appearance: Markings consistent with prescribed medication Bottle Appearance: Standard pharmacy container. Clearly labeled. Filled Date: 05 / 10 / 2024 Last Medication intake:  Today    No results found for: "CBDTHCR" No results found for: "D8THCCBX" No results found for: "D9THCCBX"  UDS:  Summary  Date Value Ref Range Status  03/11/2022 Note  Final    Comment:    ====================================================================  [employer]ox[employer] Select 13  (MW) ==================================================================== Test                             Result       Flag       Units  Drug Present and Declared for Prescription Verification   Hydrocodone                    3003         EXPECTED   ng/mg creat   Hydromorphone                  792          EXPECTED   ng/mg creat   Dihydrocodeine                 649          EXPECTED   ng/mg creat   Norhydrocodone                 5814         EXPECTED   ng/mg creat    Sources of hydrocodone include scheduled prescription medications.    Hydromorphone, dihydrocodeine and norhydrocodone are expected    metabolites of hydrocodone. Hydromorphone and dihydrocodeine are    also available as scheduled prescription medications.  Drug Present not Declared for Prescription Verification   Amphetamine                    300          UNEXPECTED ng/mg creat    Amphetamine is available as a schedule II prescription drug.    Oxazepam                       247          UNEXPECTED ng/mg creat    Oxazepam may be administered as a scheduled prescription medication;    it is also an expected metabolite of other benzodiazepine drugs,    including diazepam, chlordiazepoxide, prazepam, clorazepate,    halazepam, and temazepam.  ==================================================================== Test                      Result    Flag   Units      Ref Range   Creatinine              78               mg/dL      >=16 ==================================================================== Declared Medications:  The flagging and interpretation on this report are based on the  following declared medications.  Unexpected results may arise from  inaccuracies in the declared medications.   **Note: The testing scope of this panel includes these medications:   Hydrocodone (Norco)   **Note: The testing scope of this panel does not include the  following reported medications:   Acetaminophen (Norco)  Albuterol  (Ventolin HFA)  Albuterol (Duoneb)  Atorvastatin (Lipitor)  Carvedilol (Coreg)  Formoterol (Dulera)  Furosemide (Lasix)  Hydroxyzine (Atarax)  Ipratropium (Duoneb)  Mometasone (Dulera)  Naloxone (Narcan)  Potassium (Klor-Con)  Spironolactone (Aldactone) ==================================================================== For clinical consultation, please call [phone]. ====================================================================       ROS  Constitutional: Denies any fever or chills Gastrointestinal: No reported hemesis, hematochezia, vomiting, or acute GI distress Musculoskeletal: Denies any acute onset joint swelling, redness, loss of ROM, or weakness Neurological: No reported episodes  of acute onset apraxia, aphasia, dysarthria, agnosia, amnesia, paralysis, loss of coordination, or loss of consciousness  Medication Review  HYDROcodone-acetaminophen, albuterol, atorvastatin, carvedilol, mometasone-formoterol, naloxone, pantoprazole, potassium chloride SA, spironolactone, and torsemide  History Review  Allergy: Ms. Grilli is allergic to lisinopril. Drug: Ms. Kleinfeldt  reports no history of drug use. Alcohol:  reports no history of alcohol use. Tobacco:  reports that she quit smoking about 4 years ago. Her smoking use included cigarettes. She has a 60.00 pack-year smoking history. She has never used smokeless tobacco. Social: Ms. Jentzsch  reports that she quit smoking about 4 years ago. Her smoking use included cigarettes. She has a 60.00 pack-year smoking history. She has never used smokeless tobacco. She reports that she does not drink alcohol and does not use drugs. Medical:  has a past medical history of Anxiety, Arm pain (07/25/2015), CHF (congestive heart failure) (HCC), Congestive heart failure (HCC) (07/25/2015), COPD (chronic obstructive pulmonary disease) (HCC), Depression, Hypertension, Lower extremity pain (07/25/2015), Reflux, RSD (reflex sympathetic  dystrophy), Spinal cord stimulator status, Vitamin B12 deficiency, and Vitamin D deficiency. Surgical: Ms. Manfredo  has a past surgical history that includes Spinal cord stimulator implant. Family: family history includes Cancer in her father; Cirrhosis in her mother.  Laboratory Chemistry Profile   Renal Lab Results  Component Value Date   BUN 19 10/03/2022   CREATININE 1.01 (H) 10/03/2022   BCR 17 08/30/2017   GFRAA >60 08/18/2018   GFRNONAA >60 10/03/2022    Hepatic Lab Results  Component Value Date   AST 15 05/21/2022   ALT 20 05/21/2022   ALBUMIN 3.9 05/21/2022   ALKPHOS 83 05/21/2022    Electrolytes Lab Results  Component Value Date   NA 140 10/03/2022   K 4.6 10/03/2022   CL 103 10/03/2022   CALCIUM 9.0 10/03/2022   MG 2.8 (H) 05/20/2022   PHOS 3.4 04/20/2021    Bone Lab Results  Component Value Date   VD25OH 25.3 (L) 03/18/2016   VD125OH2TOT 76.2 03/18/2016   25OHVITD1 37 08/30/2017   25OHVITD2 1.0 08/30/2017   25OHVITD3 36 08/30/2017    Inflammation (CRP: Acute Phase) (ESR: Chronic Phase) Lab Results  Component Value Date   CRP 4.4 08/30/2017   ESRSEDRATE 30 08/30/2017         Note: Above Lab results reviewed.  Recent Imaging Review  DG Chest 2 View CLINICAL DATA:  Chest pain  EXAM: CHEST - 2 VIEW  COMPARISON:  Radiographs 07/29/2022  FINDINGS: Stable cardiomediastinal silhouette at the upper limits of normal. Aortic atherosclerotic calcification. Bibasilar atelectasis/scarring. No focal pneumonia, pleural effusion, or pneumothorax. Spinal cord stimulator leads with tips overlying the lower cervical spine. No displaced rib fractures.  IMPRESSION: No active cardiopulmonary disease.  Electronically Signed   By: Minerva Fester M.D.   On: 10/03/2022 19:20 Note: Reviewed        Physical Exam  General appearance: Well nourished, well developed, and well hydrated. In no apparent acute distress Mental status: Alert, oriented x 3 (person,  place, & time)       Respiratory: No evidence of acute respiratory distress Eyes: PERLA Vitals: BP 128/62   Pulse 88   Temp 97.7 F (36.5 C)   Resp 16   Ht 4\' 9"  (1.448 m)   Wt 130 lb (59 kg)   SpO2 95%   BMI 28.13 kg/m  BMI: Estimated body mass index is 28.13 kg/m as calculated from the following:   Height as of this encounter: 4\' 9"  (1.448 m).  Weight as of this encounter: 130 lb (59 kg). Ideal: Patient must be at least 60 in tall to calculate ideal body weight  Assessment   Diagnosis Status  1. Chronic pain syndrome   2. Lumbar spondylosis   3. Pharmacologic therapy   4. Lumbar facet syndrome (Bilateral)   5. Encounter for medication management   6. Encounter for chronic pain management   7. Complex regional pain syndrome type 1 of lower extremities (Bilateral)   8. Chronic use of opiate for therapeutic purpose   9. Complex regional pain syndrome type 1 of upper extremities (Bilateral)   10. Chronic lower extremity pain (1ry area of Pain) (Bilateral)   11. Chronic low back pain (Bilateral) w/o sciatica    Controlled Controlled Controlled   Updated Problems: No problems updated.  Plan of Care  Problem-specific:  No problem-specific Assessment & Plan notes found for this encounter.  Ms. Reveille Guhl Lawley has a current medication list which includes the following long-term medication(s): albuterol, atorvastatin, carvedilol, hydrocodone-acetaminophen, [START ON 03/21/2023] hydrocodone-acetaminophen, [START ON 04/20/2023] hydrocodone-acetaminophen, [START ON 05/20/2023] hydrocodone-acetaminophen, mometasone-formoterol, torsemide, and albuterol.  Pharmacotherapy (Medications Ordered): Meds ordered this encounter  Medications   HYDROcodone-acetaminophen (NORCO/VICODIN) 5-325 MG tablet    Sig: Take 1 tablet by mouth every 6 (six) hours as needed for severe pain. Must last 30 days.    Dispense:  120 tablet    Refill:  0    DO NOT: delete (not duplicate); no partial-fill  (will deny script to complete), no refill request (F/U required). DISPENSE: 1 day early if closed on fill date. WARN: No CNS-depressants within 8 hrs of med.   HYDROcodone-acetaminophen (NORCO/VICODIN) 5-325 MG tablet    Sig: Take 1 tablet by mouth every 6 (six) hours as needed for severe pain. Must last 30 days.    Dispense:  120 tablet    Refill:  0    DO NOT: delete (not duplicate); no partial-fill (will deny script to complete), no refill request (F/U required). DISPENSE: 1 day early if closed on fill date. WARN: No CNS-depressants within 8 hrs of med.   HYDROcodone-acetaminophen (NORCO/VICODIN) 5-325 MG tablet    Sig: Take 1 tablet by mouth every 6 (six) hours as needed for severe pain. Must last 30 days.    Dispense:  120 tablet    Refill:  0    DO NOT: delete (not duplicate); no partial-fill (will deny script to complete), no refill request (F/U required). DISPENSE: 1 day early if closed on fill date. WARN: No CNS-depressants within 8 hrs of med.   naloxone (NARCAN) nasal spray 4 mg/0.1 mL    Sig: Place 1 spray into the nose as needed for up to 365 doses (for opioid-induced respiratory depresssion). In case of emergency (overdose), spray once into each nostril. If no response within 3 minutes, repeat application and call 911.    Dispense:  1 each    Refill:  0    Instruct patient in proper use of device.   Orders:  Orders Placed This Encounter  Procedures   ToxASSURE Select 13 (MW), Urine    Volume: 30 ml(s). Minimum 3 ml of urine is needed. Document temperature of fresh sample. Indications: Long term (current) use of opiate analgesic (Z61.096)    Order Specific Question:   Release to patient    Answer:   Immediate   Nursing Instructions:    1). STAT: UDS required today. 2). Make sure to document all opioids and benzodiazepines taken, including time of last  intake. 3). If order is entered on a procedure day, make sure sample is obtained before any medications are administered.    Follow-up plan:   No follow-ups on file.      Interventional Therapies  Risk Factors  Considerations:      Planned  Pending:      Under consideration:   Diagnostic bilateral lumbar facet MBB  Possible bilateral lumbar sympathetic RFA    Completed:   Therapeutic right stellate ganglion Blk (SGB) (Multiple)  Therapeutic left stellate ganglion Blk (SGB) (Multiple)  Therapeutic right lumbar sympathetic Blk (LSB) (Multiple)  Hospital admission for therapeutic cervical epidural local anesthetic infusion (Multiple)  Hospital admission for therapeutic Lumbar epidural local anesthetic infusion (Multiple)    Therapeutic  Palliative (PRN) options:   Palliative SGB  Palliative  LSB        Recent Visits No visits were found meeting these conditions. Showing recent visits within past 90 days and meeting all other requirements Today's Visits Date Type Provider Dept  03/17/23 Office Visit Delano Metz, MD [facility]  Showing today's visits and meeting all other requirements Future Appointments No visits were found meeting these conditions. Showing future appointments within next 90 days and meeting all other requirements  I discussed the assessment and treatment plan with the patient. The patient was provided an opportunity to ask questions and all were answered. The patient agreed with the plan and demonstrated an understanding of the instructions.  Patient advised to call back or seek an in-person evaluation if the symptoms or condition worsens.  Duration of encounter: 30 minutes.  Total time on encounter, as per AMA guidelines included both the face-to-face and non-face-to-face time personally spent by the physician and/or other qualified health care professional(s) on the day of the encounter (includes time in activities that require the physician or other qualified health care professional and does not include time in activities normally performed by clinical  staff). Physician's time may include the following activities when performed: Preparing to see the patient (e.g., pre-charting review of records, searching for previously ordered imaging, lab work, and nerve conduction tests) Review of prior analgesic pharmacotherapies. Reviewing PMP Interpreting ordered tests (e.g., lab work, imaging, nerve conduction tests) Performing post-procedure evaluations, including interpretation of diagnostic procedures Obtaining and/or reviewing separately obtained history Performing a medically appropriate examination and/or evaluation Counseling and educating the patient/family/caregiver Ordering medications, tests, or procedures Referring and communicating with other health care professionals (when not separately reported) Documenting clinical information in the electronic or other health record Independently interpreting results (not separately reported) and communicating results to the patient/ family/caregiver Care coordination (not separately reported)  Note by: Oswaldo Done, MD Date: 03/17/2023; Time: 3:06 PM

## 2023-03-17 NOTE — Progress Notes (Signed)
Nursing Pain Medication Assessment:  Safety precautions to be maintained throughout the outpatient stay will include: orient to surroundings, keep bed in low position, maintain call bell within reach at all times, provide assistance with transfer out of bed and ambulation.  Medication Inspection Compliance: Pill count conducted under aseptic conditions, in front of the patient. Neither the pills nor the bottle was removed from the patient's sight at any time. Once count was completed pills were immediately returned to the patient in their original bottle.  Medication: Hydrocodone/APAP Pill/Patch Count:  18 of 120 pills remain Pill/Patch Appearance: Markings consistent with prescribed medication Bottle Appearance: Standard pharmacy container. Clearly labeled. Filled Date: 05 / 10 / 2024 Last Medication intake:  Today

## 2023-03-24 ENCOUNTER — Other Ambulatory Visit: Payer: Self-pay | Admitting: Family Medicine

## 2023-03-24 DIAGNOSIS — Z1231 Encounter for screening mammogram for malignant neoplasm of breast: Secondary | ICD-10-CM

## 2023-03-24 LAB — TOXASSURE SELECT 13 (MW), URINE

## 2023-04-13 ENCOUNTER — Ambulatory Visit
Admission: RE | Admit: 2023-04-13 | Discharge: 2023-04-13 | Disposition: A | Discharge status: Home / Self Care | Payer: Medicaid Other | Source: Ambulatory Visit | Attending: Family Medicine | Admitting: Family Medicine

## 2023-04-13 DIAGNOSIS — Z1231 Encounter for screening mammogram for malignant neoplasm of breast: Secondary | ICD-10-CM | POA: Diagnosis present

## 2023-04-19 ENCOUNTER — Other Ambulatory Visit: Payer: Self-pay | Admitting: Pain Medicine

## 2023-04-19 DIAGNOSIS — G90513 Complex regional pain syndrome I of upper limb, bilateral: Secondary | ICD-10-CM

## 2023-04-19 DIAGNOSIS — M47816 Spondylosis without myelopathy or radiculopathy, lumbar region: Secondary | ICD-10-CM

## 2023-04-19 DIAGNOSIS — G894 Chronic pain syndrome: Secondary | ICD-10-CM

## 2023-04-19 DIAGNOSIS — G90523 Complex regional pain syndrome I of lower limb, bilateral: Secondary | ICD-10-CM

## 2023-04-19 DIAGNOSIS — Z79891 Long term (current) use of opiate analgesic: Secondary | ICD-10-CM

## 2023-04-19 DIAGNOSIS — Z79899 Other long term (current) drug therapy: Secondary | ICD-10-CM

## 2023-04-19 DIAGNOSIS — M545 Low back pain, unspecified: Secondary | ICD-10-CM

## 2023-04-19 DIAGNOSIS — G8929 Other chronic pain: Secondary | ICD-10-CM

## 2023-04-21 ENCOUNTER — Other Ambulatory Visit: Payer: Self-pay | Admitting: Family Medicine

## 2023-04-21 DIAGNOSIS — R928 Other abnormal and inconclusive findings on diagnostic imaging of breast: Secondary | ICD-10-CM

## 2023-04-21 DIAGNOSIS — N63 Unspecified lump in unspecified breast: Secondary | ICD-10-CM

## 2023-04-27 ENCOUNTER — Ambulatory Visit
Admission: RE | Admit: 2023-04-27 | Discharge: 2023-04-27 | Disposition: A | Discharge status: Home / Self Care | Payer: Medicaid Other | Source: Ambulatory Visit | Attending: Family Medicine | Admitting: Family Medicine

## 2023-04-27 DIAGNOSIS — R928 Other abnormal and inconclusive findings on diagnostic imaging of breast: Secondary | ICD-10-CM | POA: Diagnosis present

## 2023-04-27 DIAGNOSIS — N63 Unspecified lump in unspecified breast: Secondary | ICD-10-CM | POA: Diagnosis present

## 2023-04-29 ENCOUNTER — Telehealth: Payer: Self-pay | Admitting: Pain Medicine

## 2023-04-29 NOTE — Telephone Encounter (Signed)
Pain meds needs PA

## 2023-04-29 NOTE — Telephone Encounter (Signed)
Pa completed via [employer].  Called and let patient know.

## 2023-05-06 ENCOUNTER — Other Ambulatory Visit: Payer: Self-pay | Admitting: Family Medicine

## 2023-05-06 ENCOUNTER — Other Ambulatory Visit: Payer: Self-pay | Admitting: Internal Medicine

## 2023-05-06 DIAGNOSIS — R928 Other abnormal and inconclusive findings on diagnostic imaging of breast: Secondary | ICD-10-CM

## 2023-05-06 DIAGNOSIS — N63 Unspecified lump in unspecified breast: Secondary | ICD-10-CM

## 2023-05-06 DIAGNOSIS — R079 Chest pain, unspecified: Secondary | ICD-10-CM

## 2023-05-10 ENCOUNTER — Ambulatory Visit
Admission: RE | Admit: 2023-05-10 | Discharge: 2023-05-10 | Disposition: A | Discharge status: Home / Self Care | Payer: Medicaid Other | Source: Ambulatory Visit | Attending: Family Medicine | Admitting: Family Medicine

## 2023-05-10 DIAGNOSIS — N63 Unspecified lump in unspecified breast: Secondary | ICD-10-CM | POA: Insufficient documentation

## 2023-05-10 DIAGNOSIS — R928 Other abnormal and inconclusive findings on diagnostic imaging of breast: Secondary | ICD-10-CM | POA: Insufficient documentation

## 2023-05-10 HISTORY — PX: BREAST BIOPSY: SHX20

## 2023-05-10 MED ORDER — LIDOCAINE 1 % OPTIME INJ - NO CHARGE
2.0000 mL | Freq: Once | INTRAMUSCULAR | Status: AC
  Administered 2023-05-10: 2 mL via INTRADERMAL
  Filled 2023-05-10: qty 2

## 2023-05-10 MED ORDER — LIDOCAINE-EPINEPHRINE 1 %-1:100000 IJ SOLN
8.0000 mL | Freq: Once | INTRAMUSCULAR | Status: AC
  Administered 2023-05-10: 8 mL

## 2023-05-18 ENCOUNTER — Telehealth (HOSPITAL_COMMUNITY): Payer: Self-pay | Admitting: *Deleted

## 2023-05-18 MED ORDER — IVABRADINE HCL 7.5 MG PO TABS: ORAL_TABLET | ORAL | 0 refills | Status: DC

## 2023-05-18 MED ORDER — METOPROLOL TARTRATE 100 MG PO TABS: ORAL_TABLET | ORAL | 0 refills | Status: DC

## 2023-05-18 NOTE — Telephone Encounter (Signed)
Reaching out to patient to offer assistance regarding upcoming cardiac imaging study; pt verbalizes understanding of appt date/time, parking situation and where to check in, pre-test NPO status and medications ordered, and verified current allergies; name and call back number provided for further questions should they arise Johney Frame RN Navigator Cardiac Imaging [name] [phone] office [phone] cell   Patient to take 15 mg ivabradine + AM dose of carvedilol 2 hours prior to CT.  Patient actively wheezing on phone with exertion.

## 2023-05-19 ENCOUNTER — Other Ambulatory Visit: Payer: Self-pay

## 2023-05-19 ENCOUNTER — Ambulatory Visit: Admission: RE | Admit: 2023-05-19 | Payer: Medicaid Other | Source: Ambulatory Visit

## 2023-05-19 MED ORDER — IVABRADINE HCL 7.5 MG PO TABS
ORAL_TABLET | ORAL | 0 refills | Status: DC
  Filled 2023-05-19: qty 2, 1d supply, fill #0

## 2023-05-19 NOTE — Addendum Note (Signed)
Addended by: Johney Frame A on: 05/19/2023 09:39 AM   Modules accepted: Orders

## 2023-05-19 NOTE — Telephone Encounter (Signed)
Received call from patient, unable to obtain ivabradine for CCTA today.  Medication unavailable at [facility].    Patient request to reschedule and pick rx up at [facility] community pharmacy.  Patient CCTA rescheduled and medication sent to [facility] community pharmacy.

## 2023-05-24 ENCOUNTER — Encounter (HOSPITAL_COMMUNITY): Payer: Self-pay

## 2023-05-25 ENCOUNTER — Telehealth (HOSPITAL_COMMUNITY): Payer: Self-pay | Admitting: Emergency Medicine

## 2023-05-25 NOTE — Telephone Encounter (Signed)
Attempted to call patient regarding upcoming cardiac CT appointment. °Left message on voicemail with name and callback number °Sara Wallace RN Navigator Cardiac Imaging °[facility] °[phone] Office °[phone] Cell ° °

## 2023-05-26 ENCOUNTER — Ambulatory Visit
Admission: RE | Admit: 2023-05-26 | Discharge: 2023-05-26 | Disposition: A | Discharge status: Home / Self Care | Payer: Medicaid Other | Source: Ambulatory Visit | Attending: Internal Medicine | Admitting: Internal Medicine

## 2023-05-26 ENCOUNTER — Other Ambulatory Visit: Payer: Medicaid Other

## 2023-05-26 DIAGNOSIS — R079 Chest pain, unspecified: Secondary | ICD-10-CM

## 2023-05-26 NOTE — Progress Notes (Signed)
Pt unable to have procedure done due to scanner being done. Pt rescheduled for Monday August 19 at 3:15.. Will also reach out to nurse navigators about more pre meds given pt HR was at 80 with pre meds today. Pt informed of all of this and verbalized understanding.

## 2023-05-28 ENCOUNTER — Telehealth (HOSPITAL_COMMUNITY): Payer: Self-pay | Admitting: Emergency Medicine

## 2023-05-28 ENCOUNTER — Other Ambulatory Visit: Payer: Self-pay

## 2023-05-28 ENCOUNTER — Encounter (HOSPITAL_COMMUNITY): Payer: Self-pay

## 2023-05-28 DIAGNOSIS — R079 Chest pain, unspecified: Secondary | ICD-10-CM

## 2023-05-28 MED ORDER — IVABRADINE HCL 7.5 MG PO TABS
ORAL_TABLET | ORAL | 0 refills | Status: DC
Start: 2023-05-28 — End: 2023-06-16
  Filled 2023-05-28: qty 2, 1d supply, fill #0

## 2023-05-28 MED ORDER — METOPROLOL TARTRATE 100 MG PO TABS
100.0000 mg | ORAL_TABLET | Freq: Once | ORAL | 0 refills | Status: DC
Start: 2023-05-28 — End: 2024-03-03
  Filled 2023-05-28: qty 1, 1d supply, fill #0

## 2023-05-28 NOTE — Telephone Encounter (Signed)
Reaching out to patient to offer assistance regarding upcoming cardiac imaging study; pt verbalizes understanding of appt date/time, parking situation and where to check in, pre-test NPO status and medications ordered, and verified current allergies; name and call back number provided for further questions should they arise Elizabeth Alexandria RN Navigator Cardiac Imaging [name] [phone] office [phone] cell  15mg  ivab taken last attempt - HR still 80s  Sent 15mg  ivab + 100mg  metoprolol for this attempt on 8/19. Pt verbalized understanding

## 2023-05-31 ENCOUNTER — Ambulatory Visit
Admission: RE | Admit: 2023-05-31 | Discharge: 2023-05-31 | Disposition: A | Discharge status: Home / Self Care | Payer: Medicaid Other | Source: Ambulatory Visit | Attending: Internal Medicine | Admitting: Internal Medicine

## 2023-05-31 ENCOUNTER — Other Ambulatory Visit: Payer: Self-pay | Admitting: Internal Medicine

## 2023-05-31 ENCOUNTER — Telehealth: Payer: Self-pay | Admitting: Pain Medicine

## 2023-05-31 DIAGNOSIS — R079 Chest pain, unspecified: Secondary | ICD-10-CM | POA: Diagnosis not present

## 2023-05-31 MED ORDER — NITROGLYCERIN 0.4 MG SL SUBL
0.8000 mg | SUBLINGUAL_TABLET | Freq: Once | SUBLINGUAL | Status: AC
  Administered 2023-05-31: 0.8 mg via SUBLINGUAL

## 2023-05-31 MED ORDER — IOHEXOL 350 MG/ML SOLN
100.0000 mL | Freq: Once | INTRAVENOUS | Status: AC | PRN
  Administered 2023-05-31: 100 mL via INTRAVENOUS

## 2023-05-31 NOTE — Telephone Encounter (Signed)
PT states that the pharmacy needs a PA to be send in for her hydrocodone prescription. Please give patient a call. TY

## 2023-05-31 NOTE — Telephone Encounter (Signed)
PA done via [employer] with notes. Patient notified.

## 2023-05-31 NOTE — Progress Notes (Signed)
Patient became anxious and upset once CT scan began, unable to complete scan d/t patient frantically requesting to stop. Patient requested to stop scan and reschedule, unable to complete scan at this time due to patients anxiety. Vital signs checked and stable. No further needs at this time. Discharge from procedure area w/o issues.

## 2023-06-09 ENCOUNTER — Other Ambulatory Visit: Payer: Self-pay

## 2023-06-10 NOTE — Progress Notes (Unsigned)
PROVIDER NOTE: Information contained herein reflects review and annotations entered in association with encounter. Interpretation of such information and data should be left to medically-trained personnel. Information provided to patient can be located elsewhere in the medical record under "Patient Instructions". Document created using STT-dictation technology, any transcriptional errors that may result from process are unintentional.    Patient: Elizabeth [name]  Service Category: E/M  Provider: Oswaldo Done, MD  DOB: Sep 28, 1961  DOS: 06/16/2023  Referring Provider: Lorn Junes, FNP  MRN: [id number]  Specialty: Interventional Pain Management  PCP: Elizabeth Junes, FNP  Type: Established Patient  Setting: Ambulatory outpatient    Location: Office  Delivery: Face-to-face     HPI  Elizabeth [name], a 62 y.o. year old female, is here today because of her Chronic pain syndrome [G89.4]. Elizabeth [name]'s primary complain today is No chief complaint on file.  Pertinent problems: [name] has Chronic low back pain (Bilateral) w/o sciatica; Chronic pain syndrome; Presence of spinal cord stimulator; Chronic lower extremity pain (1ry area of Pain) (Bilateral); Lumbar facet syndrome (Bilateral); Lumbar spondylosis; Complex regional pain syndrome type 1 of upper extremities (Bilateral); Complex regional pain syndrome type 1 of lower extremities (Bilateral); and Bilateral leg edema on their pertinent problem list. Pain Assessment: Severity of   is reported as a  /10. Location:    / . Onset:  . Quality:  . Timing:  . Modifying factor(s):  Marland Kitchen Vitals:  vitals were not taken for this visit.  BMI: Estimated body mass index is 28.13 kg/m as calculated from the following:   Height as of 03/17/23: 4\' 9"  (1.448 m).   Weight as of 03/17/23: 130 lb (59 kg). Last encounter: 03/17/2023. Last procedure: Visit date not found.  Reason for encounter: medication management. ***  RTCB: 09/17/2023    Pharmacotherapy Assessment  Analgesic: Oxycodone IR 5 mg, 1 tab PO q 6 hrs (20 mg/day of oxycodone) MME/day: 30 mg/day.   Monitoring: [place] PMP: PDMP reviewed during this encounter.       Pharmacotherapy: No side-effects or adverse reactions reported. Compliance: No problems identified. Effectiveness: Clinically acceptable.  No notes on file  No results found for: "CBDTHCR" No results found for: "D8THCCBX" No results found for: "D9THCCBX"  UDS:  Summary  Date Value Ref Range Status  03/17/2023 Note  Final    Comment:    ==================================================================== [employer] [facility] (MW) ==================================================================== Test                             Result       Flag       Units  Drug Present and Declared for Prescription Verification   Hydrocodone                    6070         EXPECTED   ng/mg creat   Hydromorphone                  848          EXPECTED   ng/mg creat   Dihydrocodeine                 677          EXPECTED   ng/mg creat   Norhydrocodone                 3445         EXPECTED  ng/mg creat    Sources of hydrocodone include scheduled prescription medications.    Hydromorphone, dihydrocodeine and norhydrocodone are expected    metabolites of hydrocodone. Hydromorphone and dihydrocodeine are    also available as scheduled prescription medications.  Drug Present not Declared for Prescription Verification   Oxazepam                       45           UNEXPECTED ng/mg creat    Oxazepam may be administered as a scheduled prescription medication;    it is also an expected metabolite of other benzodiazepine drugs,    including diazepam, chlordiazepoxide, prazepam, clorazepate,    halazepam, and temazepam.  ==================================================================== Test                      Result    Flag   Units      Ref Range   Creatinine              83               mg/dL       >=78 ==================================================================== Declared Medications:  The flagging and interpretation on this report are based on the  following declared medications.  Unexpected results may arise from  inaccuracies in the declared medications.   **Note: The testing scope of this panel includes these medications:   Hydrocodone (Norco)   **Note: The testing scope of this panel does not include the  following reported medications:   Acetaminophen (Norco)  Albuterol (Ventolin HFA)  Atorvastatin (Lipitor)  Carvedilol (Coreg)  Formoterol (Dulera)  Mometasone (Dulera)  Naloxone (Narcan)  Pantoprazole (Protonix)  Potassium (Klor-Con)  Spironolactone (Aldactone)  Torsemide (Demadex) ==================================================================== For clinical consultation, please call [phone]. ====================================================================       ROS  Constitutional: Denies any fever or chills Gastrointestinal: No reported hemesis, hematochezia, vomiting, or acute GI distress Musculoskeletal: Denies any acute onset joint swelling, redness, loss of ROM, or weakness Neurological: No reported episodes of acute onset apraxia, aphasia, dysarthria, agnosia, amnesia, paralysis, loss of coordination, or loss of consciousness  Medication Review  HYDROcodone-acetaminophen, albuterol, atorvastatin, carvedilol, ivabradine, metoprolol tartrate, mometasone-formoterol, naloxone, pantoprazole, potassium chloride SA, spironolactone, and torsemide  History Review  Allergy: Elizabeth [name] is allergic to lisinopril. Drug: Elizabeth [name]  reports no history of drug use. Alcohol:  reports no history of alcohol use. Tobacco:  reports that she quit smoking about 5 years ago. Her smoking use included cigarettes. She started smoking about 45 years ago. She has a 60 pack-year smoking history. She has never used smokeless tobacco. Social: Elizabeth [name]   reports that she quit smoking about 5 years ago. Her smoking use included cigarettes. She started smoking about 45 years ago. She has a 60 pack-year smoking history. She has never used smokeless tobacco. She reports that she does not drink alcohol and does not use drugs. Medical:  has a past medical history of Anxiety, Arm pain (07/25/2015), CHF (congestive heart failure) (HCC), Congestive heart failure (HCC) (07/25/2015), COPD (chronic obstructive pulmonary disease) (HCC), Depression, Hypertension, Lower extremity pain (07/25/2015), Reflux, RSD (reflex sympathetic dystrophy), Spinal cord stimulator status, Vitamin B12 deficiency, and Vitamin D deficiency. Surgical: Elizabeth [name]  has a past surgical history that includes Spinal cord stimulator implant; Breast biopsy (Right, 05/10/2023); and Breast biopsy (Right, 05/10/2023). Family: family history includes Cancer in her father; Cirrhosis in her mother.  Laboratory Chemistry Profile   Renal Lab  Results  Component Value Date   BUN 19 10/03/2022   CREATININE 1.01 (H) 10/03/2022   BCR 17 08/30/2017   GFRAA >60 08/18/2018   GFRNONAA >60 10/03/2022    Hepatic Lab Results  Component Value Date   AST 15 05/21/2022   ALT 20 05/21/2022   ALBUMIN 3.9 05/21/2022   ALKPHOS 83 05/21/2022    Electrolytes Lab Results  Component Value Date   NA 140 10/03/2022   K 4.6 10/03/2022   CL 103 10/03/2022   CALCIUM 9.0 10/03/2022   MG 2.8 (H) 05/20/2022   PHOS 3.4 04/20/2021    Bone Lab Results  Component Value Date   VD25OH 25.3 (L) 03/18/2016   VD125OH2TOT 76.2 03/18/2016   25OHVITD1 37 08/30/2017   25OHVITD2 1.0 08/30/2017   25OHVITD3 36 08/30/2017    Inflammation (CRP: Acute Phase) (ESR: Chronic Phase) Lab Results  Component Value Date   CRP 4.4 08/30/2017   ESRSEDRATE 30 08/30/2017         Note: Above Lab results reviewed.  Recent Imaging Review  CT MODIFIED CAL SCORE Addendum: ADDENDUM REPORT: 06/01/2023 10:16   EXAM:  OVER-READ  INTERPRETATION  CT CHEST   The following report is an over-read performed by radiologist Dr.  [facility] on 06/01/2023. This  over-read does not include interpretation of cardiac or coronary  anatomy or pathology. The coronary calcium score interpretation by  the cardiologist is attached.   COMPARISON:  None.   FINDINGS:  Atherosclerotic calcifications are noted in the thoracic aorta.  Within the visualized portions of the thorax there are no suspicious  appearing pulmonary nodules or masses, there is no acute  consolidative airspace disease, no pleural effusions, no  pneumothorax and no lymphadenopathy. Visualized portions of the  upper abdomen are unremarkable. There are no aggressive appearing  lytic or blastic lesions noted in the visualized portions of the  skeleton.   IMPRESSION:  1.  Aortic Atherosclerosis (ICD10-I70.0).   Electronically Signed    By: Trudie Reed M.D.    On: 06/01/2023 10:16 Narrative: CLINICAL DATA:  Risk stratification  CCTA not performed due to patient refusal  EXAM: Coronary Calcium Score  TECHNIQUE: The patient was scanned on a Siemens Somatom scanner. Axial non-contrast 3 mm slices were carried out through the heart. The data set was analyzed on a dedicated work station and scored using the Agatson method.  FINDINGS: Non-cardiac: See separate report from [facility].  Ascending Aorta: Normal size, aortic wall calcification  Pericardium: Normal  Coronary arteries: Normal origin of left and right coronary arteries. Distribution of arterial calcifications if present, as noted below;  LM 0  LAD 75.8  LCx 17.9  RCA 141  Total 235  IMPRESSION AND RECOMMENDATION: 1. Coronary calcium score of 235. This was 93rd percentile for age and sex matched control.  2. CAC 100-299 in LAD, LCx, RCA.  CAC-DRS A2/N3  3. Recommend aspirin and statin if no contraindication.  4. Continue heart  healthy lifestyle and risk factor modification.  5. CCTA not performed due to patient refusal  Electronically Signed: By: Debbe Odea M.D. On: 05/31/2023 18:38 Note: Reviewed        Physical Exam  General appearance: Well nourished, well developed, and well hydrated. In no apparent acute distress Mental status: Alert, oriented x 3 (person, place, & time)       Respiratory: No evidence of acute respiratory distress Eyes: PERLA Vitals: There were no vitals taken for this visit. BMI: Estimated body  mass index is 28.13 kg/m as calculated from the following:   Height as of 03/17/23: 4\' 9"  (1.448 m).   Weight as of 03/17/23: 130 lb (59 kg). Ideal: Patient weight not recorded  Assessment   Diagnosis Status  1. Chronic pain syndrome   2. Chronic lower extremity pain (1ry area of Pain) (Bilateral)   3. Chronic low back pain (Bilateral) w/o sciatica   4. Lumbar spondylosis   5. Lumbar facet syndrome (Bilateral)   6. Complex regional pain syndrome type 1 of lower extremities (Bilateral)   7. Complex regional pain syndrome type 1 of upper extremities (Bilateral)   8. Pharmacologic therapy   9. Chronic use of opiate for therapeutic purpose   10. Encounter for medication management   11. Encounter for chronic pain management    Controlled Controlled Controlled   Updated Problems: No problems updated.  Plan of Care  Problem-specific:  No problem-specific Assessment & Plan notes found for this encounter.  Ms. Anslie Elizabeth [name] has a current medication list which includes the following long-term medication(s): albuterol, albuterol, atorvastatin, carvedilol, hydrocodone-acetaminophen, metoprolol tartrate, mometasone-formoterol, and torsemide.  Pharmacotherapy (Medications Ordered): No orders of the defined types were placed in this encounter.  Orders:  No orders of the defined types were placed in this encounter.  Follow-up plan:   No follow-ups on file.       Interventional Therapies  Risk Factors  Considerations:      Planned  Pending:      Under consideration:   Diagnostic bilateral lumbar facet MBB  Possible bilateral lumbar sympathetic RFA    Completed:   Therapeutic right stellate ganglion Blk (SGB) (Multiple)  Therapeutic left stellate ganglion Blk (SGB) (Multiple)  Therapeutic right lumbar sympathetic Blk (LSB) (Multiple)  Hospital admission for therapeutic cervical epidural local anesthetic infusion (Multiple)  Hospital admission for therapeutic Lumbar epidural local anesthetic infusion (Multiple)    Therapeutic  Palliative (PRN) options:   Palliative SGB  Palliative  LSB       Recent Visits Date Type Provider Dept  03/17/23 Office Visit Delano Metz, MD [facility]  Showing recent visits within past 90 days and meeting all other requirements Future Appointments Date Type Provider Dept  06/16/23 Appointment Delano Metz, MD [facility]  Showing future appointments within next 90 days and meeting all other requirements  I discussed the assessment and treatment plan with the patient. The patient was provided an opportunity to ask questions and all were answered. The patient agreed with the plan and demonstrated an understanding of the instructions.  Patient advised to call back or seek an in-person evaluation if the symptoms or condition worsens.  Duration of encounter: *** minutes.  Total time on encounter, as per AMA guidelines included both the face-to-face and non-face-to-face time personally spent by the physician and/or other qualified health care professional(s) on the day of the encounter (includes time in activities that require the physician or other qualified health care professional and does not include time in activities normally performed by clinical staff). Physician's time may include the following activities when performed: Preparing to see the patient (e.g., pre-charting  review of records, searching for previously ordered imaging, lab work, and nerve conduction tests) Review of prior analgesic pharmacotherapies. Reviewing PMP Interpreting ordered tests (e.g., lab work, imaging, nerve conduction tests) Performing post-procedure evaluations, including interpretation of diagnostic procedures Obtaining and/or reviewing separately obtained history Performing a medically appropriate examination and/or evaluation Counseling and educating the patient/family/caregiver Ordering medications, tests, or procedures Referring  and communicating with other health care professionals (when not separately reported) Documenting clinical information in the electronic or other health record Independently interpreting results (not separately reported) and communicating results to the patient/ family/caregiver Care coordination (not separately reported)  Note by: Elizabeth Done, MD Date: 06/16/2023; Time: 2:22 PM

## 2023-06-13 NOTE — Patient Instructions (Signed)
 ____________________________________________________________________________________________  Opioid Pain Medication Update  To: All patients taking opioid pain medications. (I.e.: hydrocodone, hydromorphone, oxycodone, oxymorphone, morphine, codeine, methadone, tapentadol, tramadol, buprenorphine, fentanyl, etc.)  Re: Updated review of side effects and adverse reactions of opioid analgesics, as well as new information about long term effects of this class of medications.  Direct risks of long-term opioid therapy are not limited to opioid addiction and overdose. Potential medical risks include serious fractures, breathing problems during sleep, hyperalgesia, immunosuppression, chronic constipation, bowel obstruction, myocardial infarction, and tooth decay secondary to xerostomia.  Unpredictable adverse effects that can occur even if you take your medication correctly: Cognitive impairment, respiratory depression, and death. Most people think that if they take their medication "correctly", and "as instructed", that they will be safe. Nothing could be farther from the truth. In reality, a significant amount of recorded deaths associated with the use of opioids has occurred in individuals that had taken the medication for a long time, and were taking their medication correctly. The following are examples of how this can happen: Patient taking his/her medication for a long time, as instructed, without any side effects, is given a certain antibiotic or another unrelated medication, which in turn triggers a "Drug-to-drug interaction" leading to disorientation, cognitive impairment, impaired reflexes, respiratory depression or an untoward event leading to serious bodily harm or injury, including death.  Patient taking his/her medication for a long time, as instructed, without any side effects, develops an acute impairment of liver and/or kidney function. This will lead to a rapid inability of the body to  breakdown and eliminate their pain medication, which will result in effects similar to an "overdose", but with the same medicine and dose that they had always taken. This again may lead to disorientation, cognitive impairment, impaired reflexes, respiratory depression or an untoward event leading to serious bodily harm or injury, including death.  A similar problem will occur with patients as they grow older and their liver and kidney function begins to decrease as part of the aging process.  Background information: Historically, the original case for using long-term opioid therapy to treat chronic noncancer pain was based on safety assumptions that subsequent experience has called into question. In 1996, the American Pain Society and the American Academy of Pain Medicine issued a consensus statement supporting long-term opioid therapy. This statement acknowledged the dangers of opioid prescribing but concluded that the risk for addiction was low; respiratory depression induced by opioids was short-lived, occurred mainly in opioid-naive patients, and was antagonized by pain; tolerance was not a common problem; and efforts to control diversion should not constrain opioid prescribing. This has now proven to be wrong. Experience regarding the risks for opioid addiction, misuse, and overdose in community practice has failed to support these assumptions.  According to the Centers for Disease Control and Prevention, fatal overdoses involving opioid analgesics have increased sharply over the past decade. Currently, more than 96,700 people die from drug overdoses every year. Opioids are a factor in 7 out of every 10 overdose deaths. Deaths from drug overdose have surpassed motor vehicle accidents as the leading cause of death for individuals between the ages of 80 and 61.  Clinical data suggest that neuroendocrine dysfunction may be very common in both men and women, potentially causing hypogonadism, erectile  dysfunction, infertility, decreased libido, osteoporosis, and depression. Recent studies linked higher opioid dose to increased opioid-related mortality. Controlled observational studies reported that long-term opioid therapy may be associated with increased risk for cardiovascular events. Subsequent meta-analysis concluded  that the safety of long-term opioid therapy in elderly patients has not been proven.   Side Effects and adverse reactions: Common side effects: Drowsiness (sedation). Dizziness. Nausea and vomiting. Constipation. Physical dependence -- Dependence often manifests with withdrawal symptoms when opioids are discontinued or decreased. Tolerance -- As you take repeated doses of opioids, you require increased medication to experience the same effect of pain relief. Respiratory depression -- This can occur in healthy people, especially with higher doses. However, people with COPD, asthma or other lung conditions may be even more susceptible to fatal respiratory impairment.  Uncommon side effects: An increased sensitivity to feeling pain and extreme response to pain (hyperalgesia). Chronic use of opioids can lead to this. Delayed gastric emptying (the process by which the contents of your stomach are moved into your small intestine). Muscle rigidity. Immune system and hormonal dysfunction. Quick, involuntary muscle jerks (myoclonus). Arrhythmia. Itchy skin (pruritus). Dry mouth (xerostomia).  Long-term side effects: Chronic constipation. Sleep-disordered breathing (SDB). Increased risk of bone fractures. Hypothalamic-pituitary-adrenal dysregulation. Increased risk of overdose.  RISKS: Respiratory depression and death: Opioids increase the risk of respiratory depression and death.  Drug-to-drug interactions: Opioids are relatively contraindicated in combination with benzodiazepines, sleep inducers, and other central nervous system depressants. Other classes of medications  (i.e.: certain antibiotics and even over-the-counter medications) may also trigger or induce respiratory depression in some patients.  Medical conditions: Patients with pre-existing respiratory problems are at higher risk of respiratory failure and/or depression when in combination with opioid analgesics. Opioids are relatively contraindicated in some medical conditions such as central sleep apnea.   Fractures and Falls:  Opioids increase the risk and incidence of falls. This is of particular importance in elderly patients.  Endocrine System:  Long-term administration is associated with endocrine abnormalities (endocrinopathies). (Also known as Opioid-induced Endocrinopathy) Influences on both the hypothalamic-pituitary-adrenal axis?and the hypothalamic-pituitary-gonadal axis have been demonstrated with consequent hypogonadism and adrenal insufficiency in both sexes. Hypogonadism and decreased levels of dehydroepiandrosterone sulfate have been reported in men and women. Endocrine effects include: Amenorrhoea in women (abnormal absence of menstruation) Reduced libido in both sexes Decreased sexual function Erectile dysfunction in men Hypogonadisms (decreased testicular function with shrinkage of testicles) Infertility Depression and fatigue Loss of muscle mass Anxiety Depression Immune suppression Hyperalgesia Weight gain Anemia Osteoporosis Patients (particularly women of childbearing age) should avoid opioids. There is insufficient evidence to recommend routine monitoring of asymptomatic patients taking opioids in the long-term for hormonal deficiencies.  Immune System: Human studies have demonstrated that opioids have an immunomodulating effect. These effects are mediated via opioid receptors both on immune effector cells and in the central nervous system. Opioids have been demonstrated to have adverse effects on antimicrobial response and anti-tumour surveillance. Buprenorphine has  been demonstrated to have no impact on immune function.  Opioid Induced Hyperalgesia: Human studies have demonstrated that prolonged use of opioids can lead to a state of abnormal pain sensitivity, sometimes called opioid induced hyperalgesia (OIH). Opioid induced hyperalgesia is not usually seen in the absence of tolerance to opioid analgesia. Clinically, hyperalgesia may be diagnosed if the patient on long-term opioid therapy presents with increased pain. This might be qualitatively and anatomically distinct from pain related to disease progression or to breakthrough pain resulting from development of opioid tolerance. Pain associated with hyperalgesia tends to be more diffuse than the pre-existing pain and less defined in quality. Management of opioid induced hyperalgesia requires opioid dose reduction.  Cancer: Chronic opioid therapy has been associated with an increased risk of cancer  among noncancer patients with chronic pain. This association was more evident in chronic strong opioid users. Chronic opioid consumption causes significant pathological changes in the small intestine and colon. Epidemiological studies have found that there is a link between opium dependence and initiation of gastrointestinal cancers. Cancer is the second leading cause of death after cardiovascular disease. Chronic use of opioids can cause multiple conditions such as GERD, immunosuppression and renal damage as well as carcinogenic effects, which are associated with the incidence of cancers.   Mortality: Long-term opioid use has been associated with increased mortality among patients with chronic non-cancer pain (CNCP).  Prescription of long-acting opioids for chronic noncancer pain was associated with a significantly increased risk of all-cause mortality, including deaths from causes other than overdose.  Reference: Von Korff M, Kolodny A, Deyo RA, Chou R. Long-term opioid therapy reconsidered. Ann Intern Med. 2011  Sep 6;155(5):325-8. doi: 10.7326/[id number]. PMID: 64403474; PMCID: [id number]. Randon Goldsmith, Hayward RA, Dunn KM, [place] KP. Risk of adverse events in patients prescribed long-term opioids: A cohort study in the [place] [facility] Research Datalink. Eur J Pain. 2019 May;23(5):908-922. doi: 10.1002/ejp.1357. Epub 2019 Jan 31. PMID: 43329518. Colameco S, Coren JS, Ciervo CA. Continuous opioid treatment for chronic noncancer pain: a time for moderation in prescribing. Postgrad Med. 2009 Jul;121(4):61-6. doi: 10.3810/pgm.2009.07.2032. PMID: 84166063. William Hamburger RN, [place] SD, Blazina I, Cristopher Peru, Bougatsos C, Deyo RA. The effectiveness and risks of long-term opioid therapy for chronic pain: a systematic review for a [employer] of [employer] Pathways to [employer]. Ann Intern Med. 2015 Feb 17;162(4):276-86. doi: 10.7326/M14-2559. PMID: 01601093. Caryl Bis [facility], Makuc DM. NCHS Data Brief No. 22. [place]: Centers for Disease Control and Prevention; 2009. Sep, Increase in Fatal Poisonings Involving Opioid Analgesics in the [place], 1999-2006. Song IA, Choi HR, Oh TK. Long-term opioid use and mortality in patients with chronic non-cancer pain: Ten-year follow-up study in [place] from 2010 through 2019. EClinicalMedicine. 2022 Jul 18;51:101558. doi: 10.1016/j.eclinm.2022.235573. PMID: 22025427; PMCID: [id number]. Huser, W., Schubert, T., Vogelmann, T. et al. All-cause mortality in patients with long-term opioid therapy compared with non-opioid analgesics for chronic non-cancer pain: a database study. BMC Med 18, 162 (2020). http://lester.info/ Rashidian H, Karie Kirks, Malekzadeh R, Haghdoost AA. An Ecological Study of the Association between Opiate Use and Incidence of Cancers. Addict Health. 2016 Fall;8(4):252-260. PMID: 51761607; PMCID: [id number].  Our Goal: Our goal is to control your  pain with means other than the use of opioid pain medications.  Our Recommendation: Talk to your physician about coming off of these medications. We can assist you with the tapering down and stopping these medicines. Based on the new information, even if you cannot completely stop the medication, a decrease in the dose may be associated with a lesser risk. Ask for other means of controlling the pain. Decrease or eliminate those factors that significantly contribute to your pain such as smoking, obesity, and a diet heavily tilted towards "inflammatory" nutrients.  Last Updated: 04/19/2023   ____________________________________________________________________________________________     ____________________________________________________________________________________________  National Pain Medication Shortage  The [place] is experiencing worsening drug shortages. These have had a negative widespread effect on patient care and treatment. Not expected to improve any time soon. Predicted to last past 2029.   Drug shortage list (generic names) Oxycodone IR Oxycodone/APAP Oxymorphone IR Hydromorphone Hydrocodone/APAP Morphine  Where is the problem?  Manufacturing and supply level.  Will this shortage affect you?  Only if you  take any of the above pain medications.  How? You may be unable to fill your prescription.  Your pharmacist may offer a "partial fill" of your prescription. (Warning: Do not accept partial fills.) Prescriptions partially filled cannot be transferred to another pharmacy. Read our Medication Rules and Regulation. Depending on how much medicine you are dependent on, you may experience withdrawals when unable to get the medication.  Recommendations: Consider ending your dependence on opioid pain medications. Ask your pain specialist to assist you with the process. Consider switching to a medication currently not in shortage, such as Buprenorphine. Talk to your pain  specialist about this option. Consider decreasing your pain medication requirements by managing tolerance thru "Drug Holidays". This may help minimize withdrawals, should you run out of medicine. Control your pain thru the use of non-pharmacological interventional therapies.   Your prescriber: Prescribers cannot be blamed for shortages. Medication manufacturing and supply issues cannot be fixed by the prescriber.   NOTE: The prescriber is not responsible for supplying the medication, or solving supply issues. Work with your pharmacist to solve it. The patient is responsible for the decision to take or continue taking the medication and for identifying and securing a legal supply source. By law, supplying the medication is the job and responsibility of the pharmacy. The prescriber is responsible for the evaluation, monitoring, and prescribing of these medications.   Prescribers will NOT: Re-issue prescriptions that have been partially filled. Re-issue prescriptions already sent to a pharmacy.  Re-send prescriptions to a different pharmacy because yours did not have your medication. Ask pharmacist to order more medicine or transfer the prescription to another pharmacy. (Read below.)  New 2023 regulation: "June 12, 2022 Revised Regulation Allows DEA-Registered Pharmacies to Transfer Electronic Prescriptions at a Patient's Request DEA Headquarters Division - Public Information Office Patients now have the ability to request their electronic prescription be transferred to another pharmacy without having to go back to their practitioner to initiate the request. This revised regulation went into effect on Monday, June 08, 2022.     At a patient's request, a DEA-registered retail pharmacy can now transfer an electronic prescription for a controlled substance (schedules II-V) to another DEA-registered retail pharmacy. Prior to this change, patients would have to go through their practitioner to  cancel their prescription and have it re-issued to a different pharmacy. The process was taxing and time consuming for both patients and practitioners.    The Drug Enforcement Administration [facility]) published its intent to revise the process for transferring electronic prescriptions on August 30, 2020.  The final rule was published in the federal register on May 07, 2022 and went into effect 30 days later.  Under the final rule, a prescription can only be transferred once between pharmacies, and only if allowed under existing state or other applicable law. The prescription must remain in its electronic form; may not be altered in any way; and the transfer must be communicated directly between two licensed pharmacists. It's important to note, any authorized refills transfer with the original prescription, which means the entire prescription will be filled at the same pharmacy".  Reference: [email or web address].is [facility] website announcement)  [email or web address] [employer] of Justice)   [employer] / Vol. 88, No. 143 / Thursday, May 07, 2022 / Rules and Regulations DEPARTMENT OF JUSTICE  Drug Enforcement Administration  21 CFR Part 1306  [Docket No. [id number]]  RIN [id number] Transfer of Electronic Prescriptions for Schedules II-V Controlled Substances Between Pharmacies for Initial Filling  ____________________________________________________________________________________________  ____________________________________________________________________________________________  Transfer of Pain Medication between Pharmacies  Re: 2023 DEA Clarification on existing regulation  Published on DEA Website: June 12, 2022  Title: Revised Regulation Allows DEA-Registered Pharmacies to Electrical engineer Prescriptions at a Patient's  Request DEA Headquarters Division - [employer]  "Patients now have the ability to request their electronic prescription be transferred to another pharmacy without having to go back to their practitioner to initiate the request. This revised regulation went into effect on Monday, June 08, 2022.     At a patient's request, a DEA-registered retail pharmacy can now transfer an electronic prescription for a controlled substance (schedules II-V) to another DEA-registered retail pharmacy. Prior to this change, patients would have to go through their practitioner to cancel their prescription and have it re-issued to a different pharmacy. The process was taxing and time consuming for both patients and practitioners.    The Drug Enforcement Administration [facility]) published its intent to revise the process for transferring electronic prescriptions on August 30, 2020.  The final rule was published in the federal register on May 07, 2022 and went into effect 30 days later.  Under the final rule, a prescription can only be transferred once between pharmacies, and only if allowed under existing state or other applicable law. The prescription must remain in its electronic form; may not be altered in any way; and the transfer must be communicated directly between two licensed pharmacists. It's important to note, any authorized refills transfer with the original prescription, which means the entire prescription will be filled at the same pharmacy."    REFERENCES: 1. DEA website announcement [email or web address]  2. Department of Justice website  [email or web address]  3. DEPARTMENT OF JUSTICE Drug Enforcement Administration 21 CFR Part 1306 [Docket No. [id number]] RIN [id number] "Transfer of Electronic Prescriptions for Schedules II-V Controlled Substances  Between Pharmacies for Initial Filling"  ____________________________________________________________________________________________     _______________________________________________________________________  Medication Rules  Purpose: To inform patients, and their family members, of our medication rules and regulations.  Applies to: All patients receiving prescriptions from our practice (written or electronic).  Pharmacy of record: This is the pharmacy where your electronic prescriptions will be sent. Make sure we have the correct one.  Electronic prescriptions: In compliance with the [facility] Strengthen Opioid Misuse Prevention (STOP) Act of 2017 (Session Conni Elliot [phone]), effective October 12, 2018, all controlled substances must be electronically prescribed. Written prescriptions, faxing, or calling prescriptions to a pharmacy will no longer be done.  Prescription refills: These will be provided only during in-person appointments. No medications will be renewed without a "face-to-face" evaluation with your provider. Applies to all prescriptions.  NOTE: The following applies primarily to controlled substances (Opioid* Pain Medications).   Type of encounter (visit): For patients receiving controlled substances, face-to-face visits are required. (Not an option and not up to the patient.)  Patient's responsibilities: Pain Pills: Bring all pain pills to every appointment (except for procedure appointments). Pill Bottles: Bring pills in original pharmacy bottle. Bring bottle, even if empty. Always bring the bottle of the most recent fill.  Medication refills: You are responsible for knowing and keeping track of what medications you are taking and when is it that you will need a refill. The day before your appointment: write a list of all prescriptions that need to be refilled. The day of the appointment: give the list to the admitting nurse. Prescriptions will be written only  during appointments. No prescriptions will be written on procedure days. If you forget a  medication: it will not be "Called in", "Faxed", or "electronically sent". You will need to get another appointment to get these prescribed. No early refills. Do not call asking to have your prescription filled early. Partial  or short prescriptions: Occasionally your pharmacy may not have enough pills to fill your prescription.  NEVER ACCEPT a partial fill or a prescription that is short of the total amount of pills that you were prescribed.  With controlled substances the law allows 72 hours for the pharmacy to complete the prescription.  If the prescription is not completed within 72 hours, the pharmacist will require a new prescription to be written. This means that you will be short on your medicine and we WILL NOT send another prescription to complete your original prescription.  Instead, request the pharmacy to send a carrier to a nearby branch to get enough medication to provide you with your full prescription. Prescription Accuracy: You are responsible for carefully inspecting your prescriptions before leaving our office. Have the discharge nurse carefully go over each prescription with you, before taking them home. Make sure that your name is accurately spelled, that your address is correct. Check the name and dose of your medication to make sure it is accurate. Check the number of pills, and the written instructions to make sure they are clear and accurate. Make sure that you are given enough medication to last until your next medication refill appointment. Taking Medication: Take medication as prescribed. When it comes to controlled substances, taking less pills or less frequently than prescribed is permitted and encouraged. Never take more pills than instructed. Never take the medication more frequently than prescribed.  Inform other Doctors: Always inform, all of your healthcare providers, of all the  medications you take. Pain Medication from other Providers: You are not allowed to accept any additional pain medication from any other Doctor or Healthcare provider. There are two exceptions to this rule. (see below) In the event that you require additional pain medication, you are responsible for notifying us, as stated below. Cough Medicine: Often these contain an opioid, such as codeine or hydrocodone. Never accept or take cough medicine containing these opioids if you are already taking an opioid* medication. The combination may cause respiratory failure and death. Medication Agreement: You are responsible for carefully reading and following our Medication Agreement. This must be signed before receiving any prescriptions from our practice. Safely store a copy of your signed Agreement. Violations to the Agreement will result in no further prescriptions. (Additional copies of our Medication Agreement are available upon request.) Laws, Rules, & Regulations: All patients are expected to follow all [address] and [employer], [employer], Rules, [place] [employer]. Ignorance of the Laws does not constitute a valid excuse.  Illegal drugs and Controlled Substances: The use of illegal substances (including, but not limited to marijuana and its derivatives) and/or the illegal use of any controlled substances is strictly prohibited. Violation of this rule may result in the immediate and permanent discontinuation of any and all prescriptions being written by our practice. The use of any illegal substances is prohibited. Adopted CDC guidelines & recommendations: Target dosing levels will be at or below 60 MME/day. Use of benzodiazepines** is not recommended.  Exceptions: There are only two exceptions to the rule of not receiving pain medications from other Healthcare Providers. Exception #1 (Emergencies): In the event of an emergency (i.e.: accident requiring emergency care), you are allowed to receive additional pain  medication. However, you are responsible for: As soon as  you are able, call our office [phone], at any time of the day or night, and leave a message stating your name, the date and nature of the emergency, and the name and dose of the medication prescribed. In the event that your call is answered by a member of our staff, make sure to document and save the date, time, and the name of the person that took your information.  Exception #2 (Planned Surgery): In the event that you are scheduled by another doctor or dentist to have any type of surgery or procedure, you are allowed (for a period no longer than 30 days), to receive additional pain medication, for the acute post-op pain. However, in this case, you are responsible for picking up a copy of our "Post-op Pain Management for Surgeons" handout, and giving it to your surgeon or dentist. This document is available at our office, and does not require an appointment to obtain it. Simply go to our office during business hours (Monday-Thursday from 8:00 AM to 4:00 PM) (Friday 8:00 AM to 12:00 Noon) or if you have a scheduled appointment with [place], prior to your surgery, and ask for it by name. In addition, you are responsible for: calling our office [phone], at any time of the day or night, and leaving a message stating your name, name of your surgeon, type of surgery, and date of procedure or surgery. Failure to comply with your responsibilities may result in termination of therapy involving the controlled substances. Medication Agreement Violation. Following the above rules, including your responsibilities will help you in avoiding a Medication Agreement Violation ("Breaking your Pain Medication Contract").  Consequences:  Not following the above rules may result in permanent discontinuation of medication prescription therapy.  *Opioid medications include: morphine, codeine, oxycodone, oxymorphone, hydrocodone, hydromorphone, meperidine, tramadol,  tapentadol, buprenorphine, fentanyl, methadone. **Benzodiazepine medications include: diazepam (Valium), alprazolam (Xanax), clonazepam (Klonopine), lorazepam (Ativan), clorazepate (Tranxene), chlordiazepoxide (Librium), estazolam (Prosom), oxazepam (Serax), temazepam (Restoril), triazolam (Halcion) (Last updated: 08/04/2022) ______________________________________________________________________    ______________________________________________________________________  Medication Recommendations and Reminders  Applies to: All patients receiving prescriptions (written and/or electronic).  Medication Rules & Regulations: You are responsible for reading, knowing, and following our "Medication Rules" document. These exist for your safety and that of others. They are not flexible and neither are we. Dismissing or ignoring them is an act of "non-compliance" that may result in complete and irreversible termination of such medication therapy. For safety reasons, "non-compliance" will not be tolerated. As with the [place] fundamental legal principle of "ignorance of the law is no defense", we will accept no excuses for not having read and knowing the content of documents provided to you by our practice.  Pharmacy of record:  Definition: This is the pharmacy where your electronic prescriptions will be sent.  We do not endorse any particular pharmacy. It is up to you and your insurance to decide what pharmacy to use.  We do not restrict you in your choice of pharmacy. However, once we write for your prescriptions, we will NOT be re-sending more prescriptions to fix restricted supply problems created by your pharmacy, or your insurance.  The pharmacy listed in the electronic medical record should be the one where you want electronic prescriptions to be sent. If you choose to change pharmacy, simply notify our nursing staff. Changes will be made only during your regular appointments and not over the  phone.  Recommendations: Keep all of your pain medications in a safe place, under lock and key, even  if you live alone. We will NOT replace lost, stolen, or damaged medication. We do not accept "Police Reports" as proof of medications having been stolen. After you fill your prescription, take 1 week's worth of pills and put them away in a safe place. You should keep a separate, properly labeled bottle for this purpose. The remainder should be kept in the original bottle. Use this as your primary supply, until it runs out. Once it's gone, then you know that you have 1 week's worth of medicine, and it is time to come in for a prescription refill. If you do this correctly, it is unlikely that you will ever run out of medicine. To make sure that the above recommendation works, it is very important that you make sure your medication refill appointments are scheduled at least 1 week before you run out of medicine. To do this in an effective manner, make sure that you do not leave the office without scheduling your next medication management appointment. Always ask the nursing staff to show you in your prescription , when your medication will be running out. Then arrange for the receptionist to get you a return appointment, at least 7 days before you run out of medicine. Do not wait until you have 1 or 2 pills left, to come in. This is very poor planning and does not take into consideration that we may need to cancel appointments due to bad weather, sickness, or emergencies affecting our staff. DO NOT ACCEPT A "Partial Fill": If for any reason your pharmacy does not have enough pills/tablets to completely fill or refill your prescription, do not allow for a "partial fill". The law allows the pharmacy to complete that prescription within 72 hours, without requiring a new prescription. If they do not fill the rest of your prescription within those 72 hours, you will need a separate prescription to fill the remaining  amount, which we will NOT provide. If the reason for the partial fill is your insurance, you will need to talk to the pharmacist about payment alternatives for the remaining tablets, but again, DO NOT ACCEPT A PARTIAL FILL, unless you can trust your pharmacist to obtain the remainder of the pills within 72 hours.  Prescription refills and/or changes in medication(s):  Prescription refills, and/or changes in dose or medication, will be conducted only during scheduled medication management appointments. (Applies to both, written and electronic prescriptions.) No refills on procedure days. No medication will be changed or started on procedure days. No changes, adjustments, and/or refills will be conducted on a procedure day. Doing so will interfere with the diagnostic portion of the procedure. No phone refills. No medications will be "called into the pharmacy". No Fax refills. No weekend refills. No Holliday refills. No after hours refills.  Remember:  Business hours are:  Monday to Thursday 8:00 AM to 4:00 PM Provider's Schedule: Delano Metz, MD - Appointments are:  Medication management: Monday and Wednesday 8:00 AM to 4:00 PM Procedure day: Tuesday and Thursday 7:30 AM to 4:00 PM Edward Jolly, MD - Appointments are:  Medication management: Tuesday and Thursday 8:00 AM to 4:00 PM Procedure day: Monday and Wednesday 7:30 AM to 4:00 PM (Last update: 08/04/2022) ______________________________________________________________________   ____________________________________________________________________________________________  Naloxone Nasal Spray  Why am I receiving this medication? [place] STOP ACT requires that all patients taking high dose opioids or at risk of opioids respiratory depression, be prescribed an opioid reversal agent, such as Naloxone (AKA: Narcan).  What is this medication? NALOXONE (  nal OX one) treats opioid overdose, which causes slow or shallow breathing,  severe drowsiness, or trouble staying awake. Call emergency services after using this medication. You may need additional treatment. Naloxone works by reversing the effects of opioids. It belongs to a group of medications called opioid blockers.  COMMON BRAND NAME(S): Kloxxado, Narcan  What should I tell my care team before I take this medication? They need to know if you have any of these conditions: Heart disease Substance use disorder An unusual or allergic reaction to naloxone, other medications, foods, dyes, or preservatives Pregnant or trying to get pregnant Breast-feeding  When to use this medication? This medication is to be used for the treatment of respiratory depression (less than 8 breaths per minute) secondary to opioid overdose.   How to use this medication? This medication is for use in the nose. Lay the person on their back. Support their neck with your hand and allow the head to tilt back before giving the medication. The nasal spray should be given into 1 nostril. After giving the medication, move the person onto their side. Do not remove or test the nasal spray until ready to use. Get emergency medical help right away after giving the first dose of this medication, even if the person wakes up. You should be familiar with how to recognize the signs and symptoms of a narcotic overdose. If more doses are needed, give the additional dose in the other nostril. Talk to your care team about the use of this medication in children. While this medication may be prescribed for children as young as newborns for selected conditions, precautions do apply.  Naloxone Overdosage: If you think you have taken too much of this medicine contact a poison control center or emergency room at once.  NOTE: This medicine is only for you. Do not share this medicine with others.  What if I miss a dose? This does not apply.  What may interact with this medication? This is only used during an  emergency. No interactions are expected during emergency use. This list may not describe all possible interactions. Give your health care provider a list of all the medicines, herbs, non-prescription drugs, or dietary supplements you use. Also tell them if you smoke, drink alcohol, or use illegal drugs. Some items may interact with your medicine.  What should I watch for while using this medication? Keep this medication ready for use in the case of an opioid overdose. Make sure that you have the phone number of your care team and local hospital ready. You may need to have additional doses of this medication. Each nasal spray contains a single dose. Some emergencies may require additional doses. After use, bring the treated person to the nearest hospital or call 911. Make sure the treating care team knows that the person has received a dose of this medication. You will receive additional instructions on what to do during and after use of this medication before an emergency occurs.  What side effects may I notice from receiving this medication? Side effects that you should report to your care team as soon as possible: Allergic reactions--skin rash, itching, hives, swelling of the face, lips, tongue, or throat Side effects that usually do not require medical attention (report these to your care team if they continue or are bothersome): Constipation Dryness or irritation inside the nose Headache Increase in blood pressure Muscle spasms Stuffy nose Toothache This list may not describe all possible side effects. Call your doctor for  medical advice about side effects. You may report side effects to FDA at 1-800-FDA-1088.  Where should I keep my medication? Because this is an emergency medication, you should keep it with you at all times.  Keep out of the reach of children and pets. Store between 20 and 25 degrees C (68 and 77 degrees F). Do not freeze. Throw away any unused medication after the  expiration date. Keep in original box until ready to use.  NOTE: This sheet is a summary. It may not cover all possible information. If you have questions about this medicine, talk to your doctor, pharmacist, or health care provider.   2023 Elsevier/Gold Standard (2021-06-06 00:00:00)  ____________________________________________________________________________________________

## 2023-06-16 ENCOUNTER — Encounter: Payer: Self-pay | Admitting: Pain Medicine

## 2023-06-16 ENCOUNTER — Ambulatory Visit: Payer: Medicaid Other | Attending: Pain Medicine | Admitting: Pain Medicine

## 2023-06-16 VITALS — BP 129/56 | HR 82 | Temp 97.9°F | Resp 18 | Ht <= 58 in | Wt 139.0 lb

## 2023-06-16 DIAGNOSIS — Z79891 Long term (current) use of opiate analgesic: Secondary | ICD-10-CM | POA: Insufficient documentation

## 2023-06-16 DIAGNOSIS — M545 Low back pain, unspecified: Secondary | ICD-10-CM | POA: Diagnosis present

## 2023-06-16 DIAGNOSIS — G90523 Complex regional pain syndrome I of lower limb, bilateral: Secondary | ICD-10-CM | POA: Insufficient documentation

## 2023-06-16 DIAGNOSIS — M47816 Spondylosis without myelopathy or radiculopathy, lumbar region: Secondary | ICD-10-CM | POA: Insufficient documentation

## 2023-06-16 DIAGNOSIS — G8929 Other chronic pain: Secondary | ICD-10-CM | POA: Insufficient documentation

## 2023-06-16 DIAGNOSIS — M79605 Pain in left leg: Secondary | ICD-10-CM | POA: Insufficient documentation

## 2023-06-16 DIAGNOSIS — Z79899 Other long term (current) drug therapy: Secondary | ICD-10-CM | POA: Insufficient documentation

## 2023-06-16 DIAGNOSIS — M79604 Pain in right leg: Secondary | ICD-10-CM | POA: Diagnosis present

## 2023-06-16 DIAGNOSIS — G894 Chronic pain syndrome: Secondary | ICD-10-CM | POA: Insufficient documentation

## 2023-06-16 DIAGNOSIS — G90513 Complex regional pain syndrome I of upper limb, bilateral: Secondary | ICD-10-CM | POA: Diagnosis present

## 2023-06-16 MED ORDER — HYDROCODONE-ACETAMINOPHEN 5-325 MG PO TABS: 1.0000 | ORAL_TABLET | Freq: Four times a day (QID) | ORAL | 0 refills | Status: DC | PRN

## 2023-06-16 NOTE — Progress Notes (Signed)
Nursing Pain Medication Assessment:  Safety precautions to be maintained throughout the outpatient stay will include: orient to surroundings, keep bed in low position, maintain call bell within reach at all times, provide assistance with transfer out of bed and ambulation.  Medication Inspection Compliance: Pill count conducted under aseptic conditions, in front of the patient. Neither the pills nor the bottle was removed from the patient's sight at any time. Once count was completed pills were immediately returned to the patient in their original bottle.  Medication: Hydrocodone/APAP Pill/Patch Count:  14 of 120 pills remain Pill/Patch Appearance: Markings consistent with prescribed medication Bottle Appearance: Standard pharmacy container. Clearly labeled. Filled Date: 08 / 8 / 2024 Last Medication intake:  Today

## 2023-08-26 ENCOUNTER — Other Ambulatory Visit: Payer: Self-pay | Admitting: Pulmonary Disease

## 2023-08-26 DIAGNOSIS — R601 Generalized edema: Secondary | ICD-10-CM

## 2023-08-30 ENCOUNTER — Ambulatory Visit
Admission: RE | Admit: 2023-08-30 | Discharge: 2023-08-30 | Disposition: A | Discharge status: Home / Self Care | Payer: Medicaid Other | Source: Ambulatory Visit | Attending: Pulmonary Disease | Admitting: Pulmonary Disease

## 2023-08-30 DIAGNOSIS — R601 Generalized edema: Secondary | ICD-10-CM | POA: Diagnosis present

## 2023-09-12 NOTE — Progress Notes (Unsigned)
PROVIDER NOTE: Information contained herein reflects review and annotations entered in association with encounter. Interpretation of such information and data should be left to medically-trained personnel. Information provided to patient can be located elsewhere in the medical record under "Patient Instructions". Document created using STT-dictation technology, any transcriptional errors that may result from process are unintentional.    Patient: Elizabeth [name]  Service Category: E/M  Provider: Oswaldo Done, MD  DOB: 1960-10-30  DOS: 09/13/2023  Referring Provider: Lorn Junes, FNP  MRN: [id number]  Specialty: Interventional Pain Management  PCP: Elizabeth Junes, FNP (Inactive)  Type: Established Patient  Setting: Ambulatory outpatient    Location: Office  Delivery: Face-to-face     HPI  [name], a 62 y.o. year old female, is here today because of her No primary diagnosis found.. [name]'s primary complain today is No chief complaint on file.  Pertinent problems: [name] has Chronic low back pain (Bilateral) w/o sciatica; Chronic pain syndrome; Presence of spinal cord stimulator; Chronic lower extremity pain (1ry area of Pain) (Bilateral); Lumbar facet syndrome (Bilateral); Lumbar spondylosis; Complex regional pain syndrome type 1 of upper extremities (Bilateral); Complex regional pain syndrome type 1 of lower extremities (Bilateral); and Bilateral leg edema on their pertinent problem list. Pain Assessment: Severity of   is reported as a  /10. Location:    / . Onset:  . Quality:  . Timing:  . Modifying factor(s):  Marland Kitchen Vitals:  vitals were not taken for this visit.  BMI: Estimated body mass index is 30.08 kg/m as calculated from the following:   Height as of 06/16/23: 4\' 9"  (1.448 m).   Weight as of 06/16/23: 139 lb (63 kg). Last encounter: 06/16/2023. Last procedure: Visit date not found.  Reason for encounter: medication management. *** Discussed the use of  AI scribe software for clinical note transcription with the patient, who gave verbal consent to proceed.  History of Present Illness         12/16/2023   Pharmacotherapy Assessment  Analgesic: Oxycodone IR 5 mg, 1 tab PO q 6 hrs (20 mg/day of oxycodone) MME/day: 30 mg/day.   Monitoring:  PMP: PDMP reviewed during this encounter.       Pharmacotherapy: No side-effects or adverse reactions reported. Compliance: No problems identified. Effectiveness: Clinically acceptable.  No notes on file  No results found for: "CBDTHCR" No results found for: "D8THCCBX" No results found for: "D9THCCBX"  UDS:  Summary  Date Value Ref Range Status  03/17/2023 Note  Final    Comment:    ==================================================================== [employer] (MW) ==================================================================== Test                             Result       Flag       Units  Drug Present and Declared for Prescription Verification   Hydrocodone                    6070         EXPECTED   ng/mg creat   Hydromorphone                  848          EXPECTED   ng/mg creat   Dihydrocodeine                 677          EXPECTED  ng/mg creat   Norhydrocodone                 3445         EXPECTED   ng/mg creat    Sources of hydrocodone include scheduled prescription medications.    Hydromorphone, dihydrocodeine and norhydrocodone are expected    metabolites of hydrocodone. Hydromorphone and dihydrocodeine are    also available as scheduled prescription medications.  Drug Present not Declared for Prescription Verification   Oxazepam                       45           UNEXPECTED ng/mg creat    Oxazepam may be administered as a scheduled prescription medication;    it is also an expected metabolite of other benzodiazepine drugs,    including diazepam, chlordiazepoxide, prazepam, clorazepate,    halazepam, and  temazepam.  ==================================================================== Test                      Result    Flag   Units      Ref Range   Creatinine              83               mg/dL      >=57 ==================================================================== Declared Medications:  The flagging and interpretation on this report are based on the  following declared medications.  Unexpected results may arise from  inaccuracies in the declared medications.   **Note: The testing scope of this panel includes these medications:   Hydrocodone (Norco)   **Note: The testing scope of this panel does not include the  following reported medications:   Acetaminophen (Norco)  Albuterol (Ventolin HFA)  Atorvastatin (Lipitor)  Carvedilol (Coreg)  Formoterol (Dulera)  Mometasone (Dulera)  Naloxone (Narcan)  Pantoprazole (Protonix)  Potassium (Klor-Con)  Spironolactone (Aldactone)  Torsemide (Demadex) ==================================================================== For clinical consultation, please call [phone]. ====================================================================       ROS  Constitutional: Denies any fever or chills Gastrointestinal: No reported hemesis, hematochezia, vomiting, or acute GI distress Musculoskeletal: Denies any acute onset joint swelling, redness, loss of ROM, or weakness Neurological: No reported episodes of acute onset apraxia, aphasia, dysarthria, agnosia, amnesia, paralysis, loss of coordination, or loss of consciousness  Medication Review  HYDROcodone-acetaminophen, albuterol, atorvastatin, carvedilol, metoprolol tartrate, mometasone-formoterol, naloxone, pantoprazole, potassium chloride SA, spironolactone, and torsemide  History Review  Allergy: [name] is allergic to lisinopril. Drug: [name]  reports no history of drug use. Alcohol:  reports no history of alcohol use. Tobacco:  reports that she quit smoking about 5  years ago. Her smoking use included cigarettes. She started smoking about 45 years ago. She has a 60 pack-year smoking history. She has never used smokeless tobacco. Social: [name]  reports that she quit smoking about 5 years ago. Her smoking use included cigarettes. She started smoking about 45 years ago. She has a 60 pack-year smoking history. She has never used smokeless tobacco. She reports that she does not drink alcohol and does not use drugs. Medical:  has a past medical history of Anxiety, Arm pain (07/25/2015), CHF (congestive heart failure) (HCC), Congestive heart failure (HCC) (07/25/2015), COPD (chronic obstructive pulmonary disease) (HCC), Depression, Hypertension, Lower extremity pain (07/25/2015), Reflux, RSD (reflex sympathetic dystrophy), Spinal cord stimulator status, Vitamin B12 deficiency, and Vitamin D deficiency. Surgical: Ms. Riach  has a past surgical history that includes Spinal  cord stimulator implant; Breast biopsy (Right, 05/10/2023); and Breast biopsy (Right, 05/10/2023). Family: family history includes Cancer in her father; Cirrhosis in her mother.  Laboratory Chemistry Profile   Renal Lab Results  Component Value Date   BUN 19 10/03/2022   CREATININE 1.01 (H) 10/03/2022   BCR 17 08/30/2017   GFRAA >60 08/18/2018   GFRNONAA >60 10/03/2022    Hepatic Lab Results  Component Value Date   AST 15 05/21/2022   ALT 20 05/21/2022   ALBUMIN 3.9 05/21/2022   ALKPHOS 83 05/21/2022    Electrolytes Lab Results  Component Value Date   NA 140 10/03/2022   K 4.6 10/03/2022   CL 103 10/03/2022   CALCIUM 9.0 10/03/2022   MG 2.8 (H) 05/20/2022   PHOS 3.4 04/20/2021    Bone Lab Results  Component Value Date   VD25OH 25.3 (L) 03/18/2016   VD125OH2TOT 76.2 03/18/2016   25OHVITD1 37 08/30/2017   25OHVITD2 1.0 08/30/2017   25OHVITD3 36 08/30/2017    Inflammation (CRP: Acute Phase) (ESR: Chronic Phase) Lab Results  Component Value Date   CRP 4.4 08/30/2017    ESRSEDRATE 30 08/30/2017         Note: Above Lab results reviewed.  Recent Imaging Review  US Abdomen Limited RUQ (LIVER/GB) CLINICAL DATA:  Elita Boone  EXAM: ULTRASOUND ABDOMEN LIMITED RIGHT UPPER QUADRANT  COMPARISON:  CT abdomen pelvis 07/29/2022  FINDINGS: Gallbladder:  Stones and sludge in the gallbladder lumen. No gallbladder wall thickening or pericholecystic fluid. Negative sonographic Murphy's sign.  Common bile duct:  Diameter: 4.6 mm  Liver:  No focal lesion identified. Within normal limits in parenchymal echogenicity. Portal vein is patent on color Doppler imaging with normal direction of blood flow towards the liver.  Other: None.  IMPRESSION: Cholelithiasis without secondary signs of acute cholecystitis.  Electronically Signed   By: Annia Belt M.D.   On: 08/30/2023 12:40 Note: Reviewed        Physical Exam  General appearance: Well nourished, well developed, and well hydrated. In no apparent acute distress Mental status: Alert, oriented x 3 (person, place, & time)       Respiratory: No evidence of acute respiratory distress Eyes: PERLA Vitals: There were no vitals taken for this visit. BMI: Estimated body mass index is 30.08 kg/m as calculated from the following:   Height as of 06/16/23: 4\' 9"  (1.448 m).   Weight as of 06/16/23: 139 lb (63 kg). Ideal: Patient weight not recorded  Assessment   Diagnosis Status  1. Chronic lower extremity pain (1ry area of Pain) (Bilateral)   2. Chronic low back pain (Bilateral) w/o sciatica   3. Lumbar spondylosis   4. Lumbar facet syndrome (Bilateral)   5. Complex regional pain syndrome type 1 of lower extremities (Bilateral)   6. Complex regional pain syndrome type 1 of upper extremities (Bilateral)   7. Chronic pain syndrome   8. Pharmacologic therapy   9. Chronic use of opiate for therapeutic purpose   10. Encounter for medication management   11. Encounter for chronic pain management     Controlled Controlled Controlled   Updated Problems: No problems updated.  Plan of Care  Problem-specific:  Assessment and Plan            Ms. Phoua Brelsford Klebba has a current medication list which includes the following long-term medication(s): albuterol, albuterol, atorvastatin, carvedilol, hydrocodone-acetaminophen, metoprolol tartrate, mometasone-formoterol, and torsemide.  Pharmacotherapy (Medications Ordered): No orders of the defined types were placed in this encounter.  Orders:  No orders of the defined types were placed in this encounter.  Follow-up plan:   No follow-ups on file.      Interventional Therapies  Risk Factors  Considerations:      Planned  Pending:      Under consideration:   Diagnostic bilateral lumbar facet MBB  Possible bilateral lumbar sympathetic RFA    Completed:   Therapeutic right stellate ganglion Blk (SGB) (Multiple)  Therapeutic left stellate ganglion Blk (SGB) (Multiple)  Therapeutic right lumbar sympathetic Blk (LSB) (Multiple)  Hospital admission for therapeutic cervical epidural local anesthetic infusion (Multiple)  Hospital admission for therapeutic Lumbar epidural local anesthetic infusion (Multiple)    Therapeutic  Palliative (PRN) options:   Palliative SGB  Palliative  LSB        Recent Visits Date Type Provider Dept  06/16/23 Office Visit Delano Metz, MD [facility]  Showing recent visits within past 90 days and meeting all other requirements Future Appointments Date Type Provider Dept  09/13/23 Appointment Delano Metz, MD [facility]  Showing future appointments within next 90 days and meeting all other requirements  I discussed the assessment and treatment plan with the patient. The patient was provided an opportunity to ask questions and all were answered. The patient agreed with the plan and demonstrated an understanding of the instructions.  Patient advised to call  back or seek an in-person evaluation if the symptoms or condition worsens.  Duration of encounter: *** minutes.  Total time on encounter, as per AMA guidelines included both the face-to-face and non-face-to-face time personally spent by the physician and/or other qualified health care professional(s) on the day of the encounter (includes time in activities that require the physician or other qualified health care professional and does not include time in activities normally performed by clinical staff). Physician's time may include the following activities when performed: Preparing to see the patient (e.g., pre-charting review of records, searching for previously ordered imaging, lab work, and nerve conduction tests) Review of prior analgesic pharmacotherapies. Reviewing PMP Interpreting ordered tests (e.g., lab work, imaging, nerve conduction tests) Performing post-procedure evaluations, including interpretation of diagnostic procedures Obtaining and/or reviewing separately obtained history Performing a medically appropriate examination and/or evaluation Counseling and educating the patient/family/caregiver Ordering medications, tests, or procedures Referring and communicating with other health care professionals (when not separately reported) Documenting clinical information in the electronic or other health record Independently interpreting results (not separately reported) and communicating results to the patient/ family/caregiver Care coordination (not separately reported)  Note by: Elizabeth Done, MD Date: 09/13/2023; Time: 4:55 PM

## 2023-09-13 ENCOUNTER — Ambulatory Visit: Payer: Medicaid Other | Attending: Pain Medicine | Admitting: Pain Medicine

## 2023-09-13 ENCOUNTER — Encounter: Payer: Self-pay | Admitting: Pain Medicine

## 2023-09-13 DIAGNOSIS — Z79891 Long term (current) use of opiate analgesic: Secondary | ICD-10-CM | POA: Insufficient documentation

## 2023-09-13 DIAGNOSIS — M47816 Spondylosis without myelopathy or radiculopathy, lumbar region: Secondary | ICD-10-CM | POA: Insufficient documentation

## 2023-09-13 DIAGNOSIS — Z79899 Other long term (current) drug therapy: Secondary | ICD-10-CM | POA: Diagnosis present

## 2023-09-13 DIAGNOSIS — G894 Chronic pain syndrome: Secondary | ICD-10-CM | POA: Diagnosis present

## 2023-09-13 DIAGNOSIS — M79604 Pain in right leg: Secondary | ICD-10-CM | POA: Diagnosis present

## 2023-09-13 DIAGNOSIS — G8929 Other chronic pain: Secondary | ICD-10-CM | POA: Insufficient documentation

## 2023-09-13 DIAGNOSIS — G90513 Complex regional pain syndrome I of upper limb, bilateral: Secondary | ICD-10-CM | POA: Insufficient documentation

## 2023-09-13 DIAGNOSIS — M545 Low back pain, unspecified: Secondary | ICD-10-CM | POA: Diagnosis not present

## 2023-09-13 DIAGNOSIS — G90523 Complex regional pain syndrome I of lower limb, bilateral: Secondary | ICD-10-CM | POA: Diagnosis present

## 2023-09-13 DIAGNOSIS — M79605 Pain in left leg: Secondary | ICD-10-CM | POA: Insufficient documentation

## 2023-09-13 MED ORDER — HYDROCODONE-ACETAMINOPHEN 5-325 MG PO TABS: 1.0000 | ORAL_TABLET | Freq: Four times a day (QID) | ORAL | 0 refills | Status: DC | PRN

## 2023-09-13 NOTE — Progress Notes (Signed)
Nursing Pain Medication Assessment:  Safety precautions to be maintained throughout the outpatient stay will include: orient to surroundings, keep bed in low position, maintain call bell within reach at all times, provide assistance with transfer out of bed and ambulation.  Medication Inspection Compliance: Pill count conducted under aseptic conditions, in front of the patient. Neither the pills nor the bottle was removed from the patient's sight at any time. Once count was completed pills were immediately returned to the patient in their original bottle.  Medication: Hydrocodone/APAP Pill/Patch Count:  13 of 120 pills remain Pill/Patch Appearance: Markings consistent with prescribed medication Bottle Appearance: Standard pharmacy container. Clearly labeled. Filled Date: 29 / 6 / 2024 Last Medication intake:  Today

## 2023-09-13 NOTE — Patient Instructions (Signed)

## 2023-10-26 ENCOUNTER — Telehealth: Payer: Self-pay

## 2023-10-26 NOTE — Telephone Encounter (Signed)
 Patient called and said it is time to do her prior authorization for meds

## 2023-11-16 ENCOUNTER — Telehealth: Payer: Self-pay | Admitting: Pain Medicine

## 2023-11-16 NOTE — Telephone Encounter (Signed)
 Patient has new insurance. Attempted PA on CoverMyMeds, Eligibilety Not Found.  Called patient, reviewed insurance information for accuracy, called pharmacy, they suggested that perhaps it is not going through because she just got the insurance yesterday. I will attempt PA again tomorrow.   [employer] ID [id number] PCN [id number] Group [id number] Bin [id number]

## 2023-11-16 NOTE — Telephone Encounter (Signed)
PT called stated that the pharmacy need a PA for her hydrocodone. PT gave me the information that will be need for the PA.  RX [id number] RX BIN [id number] BNC [id number] Member ID [id number]

## 2023-11-18 ENCOUNTER — Telehealth: Payer: Self-pay

## 2023-11-18 NOTE — Telephone Encounter (Signed)
 I asked patient to call [employer] to verify her information.

## 2023-11-18 NOTE — Telephone Encounter (Signed)
 PA submitted with [name], approved. Patient notified.

## 2023-11-18 NOTE — Telephone Encounter (Signed)
 The insurance company is still showing her as Elizabeth [name]. You will have to get the prior auth for her meds under [name].

## 2023-11-18 NOTE — Telephone Encounter (Signed)
 Attempted PA again, "Eligibility not found". Patient informed.

## 2023-12-09 NOTE — Patient Instructions (Signed)
 ______________________________________________________________________    Opioid Pain Medication Update  To: All patients taking opioid pain medications. (I.e.: hydrocodone, hydromorphone, oxycodone, oxymorphone, morphine, codeine, methadone, tapentadol, tramadol, buprenorphine, fentanyl, etc.)  Re: Updated review of side effects and adverse reactions of opioid analgesics, as well as new information about long term effects of this class of medications.  Direct risks of long-term opioid therapy are not limited to opioid addiction and overdose. Potential medical risks include serious fractures, breathing problems during sleep, hyperalgesia, immunosuppression, chronic constipation, bowel obstruction, myocardial infarction, and tooth decay secondary to xerostomia.  Unpredictable adverse effects that can occur even if you take your medication correctly: Cognitive impairment, respiratory depression, and death. Most people think that if they take their medication "correctly", and "as instructed", that they will be safe. Nothing could be farther from the truth. In reality, a significant amount of recorded deaths associated with the use of opioids has occurred in individuals that had taken the medication for a long time, and were taking their medication correctly. The following are examples of how this can happen: Patient taking his/her medication for a long time, as instructed, without any side effects, is given a certain antibiotic or another unrelated medication, which in turn triggers a "Drug-to-drug interaction" leading to disorientation, cognitive impairment, impaired reflexes, respiratory depression or an untoward event leading to serious bodily harm or injury, including death.  Patient taking his/her medication for a long time, as instructed, without any side effects, develops an acute impairment of liver and/or kidney function. This will lead to a rapid inability of the body to breakdown and eliminate  their pain medication, which will result in effects similar to an "overdose", but with the same medicine and dose that they had always taken. This again may lead to disorientation, cognitive impairment, impaired reflexes, respiratory depression or an untoward event leading to serious bodily harm or injury, including death.  A similar problem will occur with patients as they grow older and their liver and kidney function begins to decrease as part of the aging process.  Background information: Historically, the original case for using long-term opioid therapy to treat chronic noncancer pain was based on safety assumptions that subsequent experience has called into question. In 1996, the American Pain Society and the American Academy of Pain Medicine issued a consensus statement supporting long-term opioid therapy. This statement acknowledged the dangers of opioid prescribing but concluded that the risk for addiction was low; respiratory depression induced by opioids was short-lived, occurred mainly in opioid-naive patients, and was antagonized by pain; tolerance was not a common problem; and efforts to control diversion should not constrain opioid prescribing. This has now proven to be wrong. Experience regarding the risks for opioid addiction, misuse, and overdose in community practice has failed to support these assumptions.  According to the Centers for Disease Control and Prevention, fatal overdoses involving opioid analgesics have increased sharply over the past decade. Currently, more than 96,700 people die from drug overdoses every year. Opioids are a factor in 7 out of every 10 overdose deaths. Deaths from drug overdose have surpassed motor vehicle accidents as the leading cause of death for individuals between the ages of 65 and 55.  Clinical data suggest that neuroendocrine dysfunction may be very common in both men and women, potentially causing hypogonadism, erectile dysfunction, infertility,  decreased libido, osteoporosis, and depression. Recent studies linked higher opioid dose to increased opioid-related mortality. Controlled observational studies reported that long-term opioid therapy may be associated with increased risk for cardiovascular events. Subsequent  meta-analysis concluded that the safety of long-term opioid therapy in elderly patients has not been proven.   Side Effects and adverse reactions: Common side effects: Drowsiness (sedation). Dizziness. Nausea and vomiting. Constipation. Physical dependence -- Dependence often manifests with withdrawal symptoms when opioids are discontinued or decreased. Tolerance -- As you take repeated doses of opioids, you require increased medication to experience the same effect of pain relief. Respiratory depression -- This can occur in healthy people, especially with higher doses. However, people with COPD, asthma or other lung conditions may be even more susceptible to fatal respiratory impairment.  Uncommon side effects: An increased sensitivity to feeling pain and extreme response to pain (hyperalgesia). Chronic use of opioids can lead to this. Delayed gastric emptying (the process by which the contents of your stomach are moved into your small intestine). Muscle rigidity. Immune system and hormonal dysfunction. Quick, involuntary muscle jerks (myoclonus). Arrhythmia. Itchy skin (pruritus). Dry mouth (xerostomia).  Long-term side effects: Chronic constipation. Sleep-disordered breathing (SDB). Increased risk of bone fractures. Hypothalamic-pituitary-adrenal dysregulation. Increased risk of overdose.  RISKS: Respiratory depression and death: Opioids increase the risk of respiratory depression and death.  Drug-to-drug interactions: Opioids are relatively contraindicated in combination with benzodiazepines, sleep inducers, and other central nervous system depressants. Other classes of medications (i.e.: certain antibiotics  and even over-the-counter medications) may also trigger or induce respiratory depression in some patients.  Medical conditions: Patients with pre-existing respiratory problems are at higher risk of respiratory failure and/or depression when in combination with opioid analgesics. Opioids are relatively contraindicated in some medical conditions such as central sleep apnea.   Fractures and Falls:  Opioids increase the risk and incidence of falls. This is of particular importance in elderly patients.  Endocrine System:  Long-term administration is associated with endocrine abnormalities (endocrinopathies). (Also known as Opioid-induced Endocrinopathy) Influences on both the hypothalamic-pituitary-adrenal axis?and the hypothalamic-pituitary-gonadal axis have been demonstrated with consequent hypogonadism and adrenal insufficiency in both sexes. Hypogonadism and decreased levels of dehydroepiandrosterone sulfate have been reported in men and women. Endocrine effects include: Amenorrhoea in women (abnormal absence of menstruation) Reduced libido in both sexes Decreased sexual function Erectile dysfunction in men Hypogonadisms (decreased testicular function with shrinkage of testicles) Infertility Depression and fatigue Loss of muscle mass Anxiety Depression Immune suppression Hyperalgesia Weight gain Anemia Osteoporosis Patients (particularly women of childbearing age) should avoid opioids. There is insufficient evidence to recommend routine monitoring of asymptomatic patients taking opioids in the long-term for hormonal deficiencies.  Immune System: Human studies have demonstrated that opioids have an immunomodulating effect. These effects are mediated via opioid receptors both on immune effector cells and in the central nervous system. Opioids have been demonstrated to have adverse effects on antimicrobial response and anti-tumour surveillance. Buprenorphine has been demonstrated to have  no impact on immune function.  Opioid Induced Hyperalgesia: Human studies have demonstrated that prolonged use of opioids can lead to a state of abnormal pain sensitivity, sometimes called opioid induced hyperalgesia (OIH). Opioid induced hyperalgesia is not usually seen in the absence of tolerance to opioid analgesia. Clinically, hyperalgesia may be diagnosed if the patient on long-term opioid therapy presents with increased pain. This might be qualitatively and anatomically distinct from pain related to disease progression or to breakthrough pain resulting from development of opioid tolerance. Pain associated with hyperalgesia tends to be more diffuse than the pre-existing pain and less defined in quality. Management of opioid induced hyperalgesia requires opioid dose reduction.  Cancer: Chronic opioid therapy has been associated with an increased risk  of cancer among noncancer patients with chronic pain. This association was more evident in chronic strong opioid users. Chronic opioid consumption causes significant pathological changes in the small intestine and colon. Epidemiological studies have found that there is a link between opium dependence and initiation of gastrointestinal cancers. Cancer is the second leading cause of death after cardiovascular disease. Chronic use of opioids can cause multiple conditions such as GERD, immunosuppression and renal damage as well as carcinogenic effects, which are associated with the incidence of cancers.   Mortality: Long-term opioid use has been associated with increased mortality among patients with chronic non-cancer pain (CNCP).  Prescription of long-acting opioids for chronic noncancer pain was associated with a significantly increased risk of all-cause mortality, including deaths from causes other than overdose.  Reference: Von Korff M, Kolodny A, Deyo RA, Chou R. Long-term opioid therapy reconsidered. Ann Intern Med. 2011 Sep 6;155(5):325-8. doi:  10.7326/[id number]. PMID: 16109604; PMCID: [id number]. Randon Goldsmith, Hayward RA, Dunn KM, [place] KP. Risk of adverse events in patients prescribed long-term opioids: A cohort study in the [place] [facility] Research Datalink. Eur J Pain. 2019 May;23(5):908-922. doi: 10.1002/ejp.1357. Epub 2019 Jan 31. PMID: 78295621. Colameco S, Coren JS, Ciervo CA. Continuous opioid treatment for chronic noncancer pain: a time for moderation in prescribing. Postgrad Med. 2009 Jul;121(4):61-6. doi: 10.3810/pgm.2009.07.2032. PMID: 30865784. William Hamburger RN, [place] SD, Blazina I, Cristopher Peru, Bougatsos C, Deyo RA. The effectiveness and risks of long-term opioid therapy for chronic pain: a systematic review for a [employer] of [employer] Pathways to [employer]. Ann Intern Med. 2015 Feb 17;162(4):276-86. doi: 10.7326/M14-2559. PMID: 69629528. [facility], Makuc DM. NCHS Data Brief No. 22. [place]: Centers for Disease Control and Prevention; 2009. Sep, Increase in Fatal Poisonings Involving Opioid Analgesics in the [place], 1999-2006. Song IA, Choi HR, Oh TK. Long-term opioid use and mortality in patients with chronic non-cancer pain: Ten-year follow-up study in [place] from 2010 through 2019. EClinicalMedicine. 2022 Jul 18;51:101558. doi: 10.1016/j.eclinm.2022.413244. PMID: 01027253; PMCID: [id number]. Huser, W., Schubert, T., Vogelmann, T. et al. All-cause mortality in patients with long-term opioid therapy compared with non-opioid analgesics for chronic non-cancer pain: a database study. BMC Med 18, 162 (2020). http://lester.info/ Rashidian H, Karie Kirks, Malekzadeh R, Haghdoost AA. An Ecological Study of the Association between Opiate Use and Incidence of Cancers. Addict Health. 2016 Fall;8(4):252-260. PMID: 59563875; PMCID: [id number].  Our Goal: Our goal is to control your pain with means other  than the use of opioid pain medications.  Our Recommendation: Talk to your physician about coming off of these medications. We can assist you with the tapering down and stopping these medicines. Based on the new information, even if you cannot completely stop the medication, a decrease in the dose may be associated with a lesser risk. Ask for other means of controlling the pain. Decrease or eliminate those factors that significantly contribute to your pain such as smoking, obesity, and a diet heavily tilted towards "inflammatory" nutrients.  Last Updated: 04/19/2023   ______________________________________________________________________       ______________________________________________________________________    National Pain Medication Shortage  The [place] is experiencing worsening drug shortages. These have had a negative widespread effect on patient care and treatment. Not expected to improve any time soon. Predicted to last past 2029.   Drug shortage list (generic names) Oxycodone IR Oxycodone/APAP Oxymorphone IR Hydromorphone Hydrocodone/APAP Morphine  Where is the problem?  Manufacturing and supply level.  Will this shortage  affect you?  Only if you take any of the above pain medications.  How? You may be unable to fill your prescription.  Your pharmacist may offer a "partial fill" of your prescription. (Warning: Do not accept partial fills.) Prescriptions partially filled cannot be transferred to another pharmacy. Read our Medication Rules and Regulation. Depending on how much medicine you are dependent on, you may experience withdrawals when unable to get the medication.  Recommendations: Consider ending your dependence on opioid pain medications. Ask your pain specialist to assist you with the process. Consider switching to a medication currently not in shortage, such as Buprenorphine. Talk to your pain specialist about this option. Consider decreasing your pain  medication requirements by managing tolerance thru "Drug Holidays". This may help minimize withdrawals, should you run out of medicine. Control your pain thru the use of non-pharmacological interventional therapies.   Your prescriber: Prescribers cannot be blamed for shortages. Medication manufacturing and supply issues cannot be fixed by the prescriber.   NOTE: The prescriber is not responsible for supplying the medication, or solving supply issues. Work with your pharmacist to solve it. The patient is responsible for the decision to take or continue taking the medication and for identifying and securing a legal supply source. By law, supplying the medication is the job and responsibility of the pharmacy. The prescriber is responsible for the evaluation, monitoring, and prescribing of these medications.   Prescribers will NOT: Re-issue prescriptions that have been partially filled. Re-issue prescriptions already sent to a pharmacy.  Re-send prescriptions to a different pharmacy because yours did not have your medication. Ask pharmacist to order more medicine or transfer the prescription to another pharmacy. (Read below.)  New 2023 regulation: "June 12, 2022 Revised Regulation Allows DEA-Registered Pharmacies to Transfer Electronic Prescriptions at a Patient's Request DEA Headquarters Division - Public Information Office Patients now have the ability to request their electronic prescription be transferred to another pharmacy without having to go back to their practitioner to initiate the request. This revised regulation went into effect on Monday, June 08, 2022.     At a patient's request, a DEA-registered retail pharmacy can now transfer an electronic prescription for a controlled substance (schedules II-V) to another DEA-registered retail pharmacy. Prior to this change, patients would have to go through their practitioner to cancel their prescription and have it re-issued to a different  pharmacy. The process was taxing and time consuming for both patients and practitioners.    The Drug Enforcement Administration [facility]) published its intent to revise the process for transferring electronic prescriptions on August 30, 2020.  The final rule was published in the federal register on May 07, 2022 and went into effect 30 days later.  Under the final rule, a prescription can only be transferred once between pharmacies, and only if allowed under existing state or other applicable law. The prescription must remain in its electronic form; may not be altered in any way; and the transfer must be communicated directly between two licensed pharmacists. It's important to note, any authorized refills transfer with the original prescription, which means the entire prescription will be filled at the same pharmacy".  Reference: [email or web address] [facility] website announcement)  [email or web address] [employer] of [employer])   [employer] / Vol. 88, No. 143 / Thursday, May 07, 2022 / Rules and Regulations DEPARTMENT OF JUSTICE  Drug Enforcement Administration  21 CFR Part 1306  [Docket No. [id number]]  RIN [id number] Transfer of Electronic Prescriptions for Schedules II-V Controlled Substances Between  Pharmacies for Initial Filling  ______________________________________________________________________       ______________________________________________________________________    Transfer of Pain Medication between Pharmacies  Re: 2023 DEA Clarification on existing regulation  Published on DEA Website: June 12, 2022  Title: Revised Regulation Allows DEA-Registered Pharmacies to Electrical engineer Prescriptions at a Patient's Request DEA Headquarters Division - [employer]  "Patients now have the ability to  request their electronic prescription be transferred to another pharmacy without having to go back to their practitioner to initiate the request. This revised regulation went into effect on Monday, June 08, 2022.     At a patient's request, a DEA-registered retail pharmacy can now transfer an electronic prescription for a controlled substance (schedules II-V) to another DEA-registered retail pharmacy. Prior to this change, patients would have to go through their practitioner to cancel their prescription and have it re-issued to a different pharmacy. The process was taxing and time consuming for both patients and practitioners.    The Drug Enforcement Administration [facility]) published its intent to revise the process for transferring electronic prescriptions on August 30, 2020.  The final rule was published in the federal register on May 07, 2022 and went into effect 30 days later.  Under the final rule, a prescription can only be transferred once between pharmacies, and only if allowed under existing state or other applicable law. The prescription must remain in its electronic form; may not be altered in any way; and the transfer must be communicated directly between two licensed pharmacists. It's important to note, any authorized refills transfer with the original prescription, which means the entire prescription will be filled at the same pharmacy."    REFERENCES: 1. DEA website announcement [email or web address]  2. Department of Justice website  [email or web address].pdf  3. DEPARTMENT OF JUSTICE Drug Enforcement Administration 21 CFR Part 1306 [Docket No. [id number]] RIN [id number] "Transfer of Electronic Prescriptions for Schedules II-V Controlled Substances Between Pharmacies for Initial  Filling"  ______________________________________________________________________       ______________________________________________________________________    Medication Rules  Purpose: To inform patients, and their family members, of our medication rules and regulations.  Applies to: All patients receiving prescriptions from our practice (written or electronic).  Pharmacy of record: This is the pharmacy where your electronic prescriptions will be sent. Make sure we have the correct one.  Electronic prescriptions: In compliance with the [facility] Strengthen Opioid Misuse Prevention (STOP) Act of 2017 (Session Conni Elliot [phone]), effective October 12, 2018, all controlled substances must be electronically prescribed. Written prescriptions, faxing, or calling prescriptions to a pharmacy will no longer be done.  Prescription refills: These will be provided only during in-person appointments. No medications will be renewed without a "face-to-face" evaluation with your provider. Applies to all prescriptions.  NOTE: The following applies primarily to controlled substances (Opioid* Pain Medications).   Type of encounter (visit): For patients receiving controlled substances, face-to-face visits are required. (Not an option and not up to the patient.)  Patient's Responsibilities: Pain Pills: Bring all pain pills to every appointment (except for procedure appointments). Pill counts are required.  Pill Bottles: Bring pills in original pharmacy bottle. Bring bottle, even if empty. Always bring the bottle of the most recent fill.  Medication refills: You are responsible for knowing and keeping track of what medications you are taking and when is it that you will need a refill. The day before your appointment: write a list of all prescriptions that need to be refilled. The day of the appointment: give the list to  the admitting nurse. Prescriptions will be written only during appointments. No  prescriptions will be written on procedure days. If you forget a medication: it will not be "Called in", "Faxed", or "electronically sent". You will need to get another appointment to get these prescribed. No early refills. Do not call asking to have your prescription filled early. Partial  or short prescriptions: Occasionally your pharmacy may not have enough pills to fill your prescription.  NEVER ACCEPT a partial fill or a prescription that is short of the total amount of pills that you were prescribed.  With controlled substances the law allows 72 hours for the pharmacy to complete the prescription.  If the prescription is not completed within 72 hours, the pharmacist will require a new prescription to be written. This means that you will be short on your medicine and we WILL NOT send another prescription to complete your original prescription.  Instead, request the pharmacy to send a carrier to a nearby branch to get enough medication to provide you with your full prescription. Prescription Accuracy: You are responsible for carefully inspecting your prescriptions before leaving our office. Have the discharge nurse carefully go over each prescription with you, before taking them home. Make sure that your name is accurately spelled, that your address is correct. Check the name and dose of your medication to make sure it is accurate. Check the number of pills, and the written instructions to make sure they are clear and accurate. Make sure that you are given enough medication to last until your next medication refill appointment. Taking Medication: Take medication as prescribed. When it comes to controlled substances, taking less pills or less frequently than prescribed is permitted and encouraged. Never take more pills than instructed. Never take the medication more frequently than prescribed.  Inform other Doctors: Always inform, all of your healthcare providers, of all the medications you take. Pain  Medication from other Providers: You are not allowed to accept any additional pain medication from any other Doctor or Healthcare provider. There are two exceptions to this rule. (see below) In the event that you require additional pain medication, you are responsible for notifying us, as stated below. Cough Medicine: Often these contain an opioid, such as codeine or hydrocodone. Never accept or take cough medicine containing these opioids if you are already taking an opioid* medication. The combination may cause respiratory failure and death. Medication Agreement: You are responsible for carefully reading and following our Medication Agreement. This must be signed before receiving any prescriptions from our practice. Safely store a copy of your signed Agreement. Violations to the Agreement will result in no further prescriptions. (Additional copies of our Medication Agreement are available upon request.) Laws, Rules, & Regulations: All patients are expected to follow all [address] and [employer], [employer], Rules, [place] [employer]. Ignorance of the Laws does not constitute a valid excuse.  Illegal drugs and Controlled Substances: The use of illegal substances (including, but not limited to marijuana and its derivatives) and/or the illegal use of any controlled substances is strictly prohibited. Violation of this rule may result in the immediate and permanent discontinuation of any and all prescriptions being written by our practice. The use of any illegal substances is prohibited. Adopted CDC guidelines & recommendations: Target dosing levels will be at or below 60 MME/day. Use of benzodiazepines** is not recommended. Urine Drug testing: Patients taking controlled substances will be required to provide a urine sample upon request. Do not void before coming to your medication management appointments.  Hold emptying your bladder until you are admitted. The admitting nurse will inform you if a sample is required. Our  practice reserves the right to call you at any time to provide a sample. Once receiving the call, you have 24 hours to comply with request. Not providing a sample upon request may result in termination of medication therapy.  Exceptions: There are only two exceptions to the rule of not receiving pain medications from other Healthcare Providers. Exception #1 (Emergencies): In the event of an emergency (i.e.: accident requiring emergency care), you are allowed to receive additional pain medication. However, you are responsible for: As soon as you are able, call our office [phone], at any time of the day or night, and leave a message stating your name, the date and nature of the emergency, and the name and dose of the medication prescribed. In the event that your call is answered by a member of our staff, make sure to document and save the date, time, and the name of the person that took your information.  Exception #2 (Planned Surgery): In the event that you are scheduled by another doctor or dentist to have any type of surgery or procedure, you are allowed (for a period no longer than 30 days), to receive additional pain medication, for the acute post-op pain. However, in this case, you are responsible for picking up a copy of our "Post-op Pain Management for Surgeons" handout, and giving it to your surgeon or dentist. This document is available at our office, and does not require an appointment to obtain it. Simply go to our office during business hours (Monday-Thursday from 8:00 AM to 4:00 PM) (Friday 8:00 AM to 12:00 Noon) or if you have a scheduled appointment with [place], prior to your surgery, and ask for it by name. In addition, you are responsible for: calling our office [phone], at any time of the day or night, and leaving a message stating your name, name of your surgeon, type of surgery, and date of procedure or surgery. Failure to comply with your responsibilities may result in termination  of therapy involving the controlled substances.  Consequences:  Non-compliance with the above rules may result in permanent discontinuation of medication prescription therapy. All patients receiving any type of controlled substance is expected to comply with the above patient responsibilities. Not doing so may result in permanent discontinuation of medication prescription therapy. Medication Agreement Violation. Following the above rules, including your responsibilities will help you in avoiding a Medication Agreement Violation ("Breaking your Pain Medication Contract").  *Opioid medications include: morphine, codeine, oxycodone, oxymorphone, hydrocodone, hydromorphone, meperidine, tramadol, tapentadol, buprenorphine, fentanyl, methadone. **Benzodiazepine medications include: diazepam (Valium), alprazolam (Xanax), clonazepam (Klonopine), lorazepam (Ativan), clorazepate (Tranxene), chlordiazepoxide (Librium), estazolam (Prosom), oxazepam (Serax), temazepam (Restoril), triazolam (Halcion) (Last updated: 08/04/2023) ______________________________________________________________________      ______________________________________________________________________    Medication Recommendations and Reminders  Applies to: All patients receiving prescriptions (written and/or electronic).  Medication Rules & Regulations: You are responsible for reading, knowing, and following our "Medication Rules" document. These exist for your safety and that of others. They are not flexible and neither are we. Dismissing or ignoring them is an act of "non-compliance" that may result in complete and irreversible termination of such medication therapy. For safety reasons, "non-compliance" will not be tolerated. As with the [place] fundamental legal principle of "ignorance of the law is no defense", we will accept no excuses for not having read and knowing the content of documents provided to you by  our practice.  Pharmacy of  record:  Definition: This is the pharmacy where your electronic prescriptions will be sent.  We do not endorse any particular pharmacy. It is up to you and your insurance to decide what pharmacy to use.  We do not restrict you in your choice of pharmacy. However, once we write for your prescriptions, we will NOT be re-sending more prescriptions to fix restricted supply problems created by your pharmacy, or your insurance.  The pharmacy listed in the electronic medical record should be the one where you want electronic prescriptions to be sent. If you choose to change pharmacy, simply notify our nursing staff. Changes will be made only during your regular appointments and not over the phone.  Recommendations: Keep all of your pain medications in a safe place, under lock and key, even if you live alone. We will NOT replace lost, stolen, or damaged medication. We do not accept "Police Reports" as proof of medications having been stolen. After you fill your prescription, take 1 week's worth of pills and put them away in a safe place. You should keep a separate, properly labeled bottle for this purpose. The remainder should be kept in the original bottle. Use this as your primary supply, until it runs out. Once it's gone, then you know that you have 1 week's worth of medicine, and it is time to come in for a prescription refill. If you do this correctly, it is unlikely that you will ever run out of medicine. To make sure that the above recommendation works, it is very important that you make sure your medication refill appointments are scheduled at least 1 week before you run out of medicine. To do this in an effective manner, make sure that you do not leave the office without scheduling your next medication management appointment. Always ask the nursing staff to show you in your prescription , when your medication will be running out. Then arrange for the receptionist to get you a return appointment, at least  7 days before you run out of medicine. Do not wait until you have 1 or 2 pills left, to come in. This is very poor planning and does not take into consideration that we may need to cancel appointments due to bad weather, sickness, or emergencies affecting our staff. DO NOT ACCEPT A "Partial Fill": If for any reason your pharmacy does not have enough pills/tablets to completely fill or refill your prescription, do not allow for a "partial fill". The law allows the pharmacy to complete that prescription within 72 hours, without requiring a new prescription. If they do not fill the rest of your prescription within those 72 hours, you will need a separate prescription to fill the remaining amount, which we will NOT provide. If the reason for the partial fill is your insurance, you will need to talk to the pharmacist about payment alternatives for the remaining tablets, but again, DO NOT ACCEPT A PARTIAL FILL, unless you can trust your pharmacist to obtain the remainder of the pills within 72 hours.  Prescription refills and/or changes in medication(s):  Prescription refills, and/or changes in dose or medication, will be conducted only during scheduled medication management appointments. (Applies to both, written and electronic prescriptions.) No refills on procedure days. No medication will be changed or started on procedure days. No changes, adjustments, and/or refills will be conducted on a procedure day. Doing so will interfere with the diagnostic portion of the procedure. No phone refills. No medications will be "  called into the pharmacy". No Fax refills. No weekend refills. No Holliday refills. No after hours refills.  Remember:  Business hours are:  Monday to Thursday 8:00 AM to 4:00 PM Provider's Schedule: Delano Metz, MD - Appointments are:  Medication management: Monday and Wednesday 8:00 AM to 4:00 PM Procedure day: Tuesday and Thursday 7:30 AM to 4:00 PM Edward Jolly, MD -  Appointments are:  Medication management: Tuesday and Thursday 8:00 AM to 4:00 PM Procedure day: Monday and Wednesday 7:30 AM to 4:00 PM (Last update: 08/04/2022) ______________________________________________________________________      ______________________________________________________________________    Drug Holidays  What is a "Drug Holiday"? Drug Holiday: is the name given to the process of slowly tapering down and temporarily stopping the pain medication for the purpose of decreasing or eliminating tolerance to the drug.  Benefits Improved effectiveness Decreased required effective dose Improved pain control End dependence on high dose therapy Decrease cost of therapy Uncovering "opioid-induced hyperalgesia". (OIH)  What is "opioid hyperalgesia"? It is a paradoxical increase in pain caused by exposure to opioids. Stopping the opioid pain medication, contrary to the expected, it actually decreases or completely eliminates the pain. Ref.: "A comprehensive review of opioid-induced hyperalgesia". Donney Rankins, et.al. Pain Physician. 2011 Mar-Apr;14(2):145-61.  What is tolerance? Tolerance: the progressive loss of effectiveness of a pain medicine due to repetitive use. A common problem of opioid pain medications.  How long should a "Drug Holiday" last? Effectiveness depends on the patient staying off all opioid pain medicines for a minimum of 14 consecutive days. (2 weeks) During this time the patient should not be taking any other opioid analgesic medication.  How about just taking less of the medicine? Does not work. Will not accomplish goal of eliminating the excess receptors.  How about switching to a different pain medicine? (AKA. "Opioid rotation") Does not work. Creates the illusion of effectiveness by taking advantage of inaccurate equivalent dose calculations between different opioids. -This "technique" was promoted by studies funded by [employer], such  as [employer], creators of "OxyContin".  Can I stop the medicine "cold [place]"? We do not recommend it. You should always coordinate with your prescribing physician to make the transition as smoothly as possible. Avoid stopping the medicine abruptly without consulting. We recommend a "slow taper".  What is a slow taper? Taper: refers to the gradual decrease in dose.   How do I stop/taper the dose? Slowly. Decrease the daily amount of pills that you take by one (1) pill every seven (7) days. This is called a "slow downward taper". Example: if you normally take four (4) pills per day, drop it to three (3) pills per day for seven (7) days, then to two (2) pills per day for seven (7) days, then to one (1) per day for seven (7) days, and then stop the medicine. The 14 day "Drug Holiday" starts on the first day without medicine.   Will I experience withdrawals? Unlikely with a slow taper.  What triggers withdrawals? Withdrawals are triggered by the sudden/abrupt stop of high dose opioids. Withdrawals can be avoided by slowly decreasing the dose over a prolonged period of time.  What are withdrawals? Symptoms associated with sudden/abrupt reduction/stopping of high-dose, long-term use of pain medication. Withdrawal are seldom seen on low dose therapy, or patients rarely taking opioid medication.  Early Withdrawal Symptoms may include: Agitation Anxiety Muscle aches Increased tearing Insomnia Runny nose Sweating Yawning  Late symptoms may include: Abdominal cramping Diarrhea Dilated pupils Goose bumps Nausea Vomiting  When could I see withdrawals? Onset: 8-24 hours after last use for most opioids. 12-48 hours for long-acting opioids (i.e.: methadone)  How long could they last? Duration: 4-10 days for most opioids. 14-21 days for long-acting opioids (i.e.: methadone)  What will happen after I complete my "Drug Holiday"? The need and indications for the opioid analgesic will be  reviewed before restarting the medication. Dose requirements will likely decrease and the dose will need to be adjusted accordingly.   (Last update: 08/04/2023) ______________________________________________________________________      ______________________________________________________________________     Naloxone Nasal Spray  Why am I receiving this medication? [place] STOP ACT requires that all patients taking high dose opioids or at risk of opioids respiratory depression, be prescribed an opioid reversal agent, such as Naloxone (AKA: Narcan).  What is this medication? NALOXONE (nal OX one) treats opioid overdose, which causes slow or shallow breathing, severe drowsiness, or trouble staying awake. Call emergency services after using this medication. You may need additional treatment. Naloxone works by reversing the effects of opioids. It belongs to a group of medications called opioid blockers.  COMMON BRAND NAME(S): Kloxxado, Narcan  What should I tell my care team before I take this medication? They need to know if you have any of these conditions: Heart disease Substance use disorder An unusual or allergic reaction to naloxone, other medications, foods, dyes, or preservatives Pregnant or trying to get pregnant Breast-feeding  When to use this medication? This medication is to be used for the treatment of respiratory depression (less than 8 breaths per minute) secondary to opioid overdose.   How to use this medication? This medication is for use in the nose. Lay the person on their back. Support their neck with your hand and allow the head to tilt back before giving the medication. The nasal spray should be given into 1 nostril. After giving the medication, move the person onto their side. Do not remove or test the nasal spray until ready to use. Get emergency medical help right away after giving the first dose of this medication, even if the person wakes up. You  should be familiar with how to recognize the signs and symptoms of a narcotic overdose. If more doses are needed, give the additional dose in the other nostril. Talk to your care team about the use of this medication in children. While this medication may be prescribed for children as young as newborns for selected conditions, precautions do apply.  Naloxone Overdosage: If you think you have taken too much of this medicine contact a poison control center or emergency room at once.  NOTE: This medicine is only for you. Do not share this medicine with others.  What if I miss a dose? This does not apply.  What may interact with this medication? This is only used during an emergency. No interactions are expected during emergency use. This list may not describe all possible interactions. Give your health care provider a list of all the medicines, herbs, non-prescription drugs, or dietary supplements you use. Also tell them if you smoke, drink alcohol, or use illegal drugs. Some items may interact with your medicine.  What should I watch for while using this medication? Keep this medication ready for use in the case of an opioid overdose. Make sure that you have the phone number of your care team and local hospital ready. You may need to have additional doses of this medication. Each nasal spray contains a single dose. Some emergencies may require  additional doses. After use, bring the treated person to the nearest hospital or call 911. Make sure the treating care team knows that the person has received a dose of this medication. You will receive additional instructions on what to do during and after use of this medication before an emergency occurs.  What side effects may I notice from receiving this medication? Side effects that you should report to your care team as soon as possible: Allergic reactions--skin rash, itching, hives, swelling of the face, lips, tongue, or throat Side effects that  usually do not require medical attention (report these to your care team if they continue or are bothersome): Constipation Dryness or irritation inside the nose Headache Increase in blood pressure Muscle spasms Stuffy nose Toothache This list may not describe all possible side effects. Call your doctor for medical advice about side effects. You may report side effects to FDA at 1-800-FDA-1088.  Where should I keep my medication? Because this is an emergency medication, you should keep it with you at all times.  Keep out of the reach of children and pets. Store between 20 and 25 degrees C (68 and 77 degrees F). Do not freeze. Throw away any unused medication after the expiration date. Keep in original box until ready to use.  NOTE: This sheet is a summary. It may not cover all possible information. If you have questions about this medicine, talk to your doctor, pharmacist, or health care provider.   2023 Elsevier/Gold Standard (2021-06-06 00:00:00)  ______________________________________________________________________

## 2023-12-09 NOTE — Progress Notes (Signed)
 PROVIDER NOTE: Information contained herein reflects review and annotations entered in association with encounter. Interpretation of such information and data should be left to medically-trained personnel. Information provided to patient can be located elsewhere in the medical record under "Patient Instructions". Document created using STT-dictation technology, any transcriptional errors that may result from process are unintentional.    Patient: Elizabeth [name]  Service Category: E/M  Provider: Oswaldo Done, MD  DOB: Mar 23, 1961  DOS: 12/13/2023  Referring Provider: No ref. provider found  MRN: [id number]  Specialty: Interventional Pain Management  PCP: Lorn Junes, FNP (Inactive)  Type: Established Patient  Setting: Ambulatory outpatient    Location: Office  Delivery: Face-to-face     HPI  Elizabeth [name], a 63 y.o. year old female, is here today because of her No primary diagnosis found.. Elizabeth [name]'s primary complain today is Hand Pain (Right and left) and Foot Pain (Right and left-RSD)  Pertinent problems: Elizabeth [name] has Chronic low back pain (Bilateral) w/o sciatica; Chronic pain syndrome; Presence of spinal cord stimulator; Chronic lower extremity pain (1ry area of Pain) (Bilateral); Lumbar facet syndrome (Bilateral); Lumbar spondylosis; Complex regional pain syndrome type 1 of upper extremities (Bilateral); Complex regional pain syndrome type 1 of lower extremities (Bilateral); and Bilateral leg edema on their pertinent problem list. Pain Assessment: Severity of Chronic pain is reported as a 3 /10. Location: Foot Right, Left/denies. Onset: More than a month ago. Quality: Nagging (aggrivating). Timing: Constant. Modifying factor(s): medications only. Vitals:  height is 4\' 9"  (1.448 m) and weight is 135 lb (61.2 kg). Her temporal temperature is 97.3 F (36.3 C) (abnormal). Her blood pressure is 125/63 and her pulse is 79. Her respiration is 17 and oxygen saturation is  97%.  BMI: Estimated body mass index is 29.21 kg/m as calculated from the following:   Height as of this encounter: 4\' 9"  (1.448 m).   Weight as of this encounter: 135 lb (61.2 kg). Last encounter: 09/13/2023. Last procedure: Visit date not found.  Reason for encounter: medication management.  The patient indicates doing well with the current medication regimen. No adverse reactions or side effects reported to the medications.   Discussed the use of AI scribe software for clinical note transcription with the patient, who gave verbal consent to proceed.  History of Present Illness   Elizabeth [name] is a 63 year old female who presents with right hand pain.  She experiences chronic pain in her right hand, which is currently well-managed with her prescribed pain medication. No side effects or adverse reactions to the medication. The medication is effective in controlling her pain.  Her medication management is stable, with no reported side effects or adverse reactions. She continues to use the same pharmacy for her prescriptions, and a recent Prescription Monitoring Program check was satisfactory.     RTCB: 03/15/2024   Pharmacotherapy Assessment  Analgesic: Oxycodone IR 5 mg, 1 tab PO q 6 hrs (20 mg/day of oxycodone) MME/day: 30 mg/day.   Monitoring: [place] PMP: PDMP reviewed during this encounter.       Pharmacotherapy: No side-effects or adverse reactions reported. Compliance: No problems identified. Effectiveness: Clinically acceptable.  Rickey Barbara, RN  12/13/2023  1:17 PM  Sign when Signing Visit Nursing Pain Medication Assessment:  Safety precautions to be maintained throughout the outpatient stay will include: orient to surroundings, keep bed in low position, maintain call bell within reach at all times, provide assistance with transfer out of bed and ambulation.  Medication Inspection Compliance: Pill count conducted under aseptic conditions, in front of the patient.  Neither the pills nor the bottle was removed from the patient's sight at any time. Once count was completed pills were immediately returned to the patient in their original bottle.  Medication: Hydrocodone/APAP Pill/Patch Count:  10 of 120 pills remain Pill/Patch Appearance: Markings consistent with prescribed medication Bottle Appearance: Standard pharmacy container. Clearly labeled. Filled Date: 02 / 04 / 2025 Last Medication intake:  Today    No results found for: "CBDTHCR" No results found for: "D8THCCBX" No results found for: "D9THCCBX"  UDS:  Summary  Date Value Ref Range Status  03/17/2023 Note  Final    Comment:    ==================================================================== [employer] [facility] (MW) ==================================================================== Test                             Result       Flag       Units  Drug Present and Declared for Prescription Verification   Hydrocodone                    6070         EXPECTED   ng/mg creat   Hydromorphone                  848          EXPECTED   ng/mg creat   Dihydrocodeine                 677          EXPECTED   ng/mg creat   Norhydrocodone                 3445         EXPECTED   ng/mg creat    Sources of hydrocodone include scheduled prescription medications.    Hydromorphone, dihydrocodeine and norhydrocodone are expected    metabolites of hydrocodone. Hydromorphone and dihydrocodeine are    also available as scheduled prescription medications.  Drug Present not Declared for Prescription Verification   Oxazepam                       45           UNEXPECTED ng/mg creat    Oxazepam may be administered as a scheduled prescription medication;    it is also an expected metabolite of other benzodiazepine drugs,    including diazepam, chlordiazepoxide, prazepam, clorazepate,    halazepam, and temazepam.  ==================================================================== Test                       Result    Flag   Units      Ref Range   Creatinine              83               mg/dL      >=14 ==================================================================== Declared Medications:  The flagging and interpretation on this report are based on the  following declared medications.  Unexpected results may arise from  inaccuracies in the declared medications.   **Note: The testing scope of this panel includes these medications:   Hydrocodone (Norco)   **Note: The testing scope of this panel does not include the  following reported medications:   Acetaminophen (Norco)  Albuterol (Ventolin HFA)  Atorvastatin (Lipitor)  Carvedilol (Coreg)  Formoterol (  Dulera)  Mometasone (Dulera)  Naloxone (Narcan)  Pantoprazole (Protonix)  Potassium (Klor-Con)  Spironolactone (Aldactone)  Torsemide (Demadex) ==================================================================== For clinical consultation, please call [phone]. ====================================================================       ROS  Constitutional: Denies any fever or chills Gastrointestinal: No reported hemesis, hematochezia, vomiting, or acute GI distress Musculoskeletal: Denies any acute onset joint swelling, redness, loss of ROM, or weakness Neurological: No reported episodes of acute onset apraxia, aphasia, dysarthria, agnosia, amnesia, paralysis, loss of coordination, or loss of consciousness  Medication Review  HYDROcodone-acetaminophen, albuterol, atorvastatin, carvedilol, metoprolol tartrate, mometasone-formoterol, naloxone, pantoprazole, potassium chloride SA, spironolactone, and torsemide  History Review  Allergy: Elizabeth [name] is allergic to lisinopril. Drug: Elizabeth [name]  reports no history of drug use. Alcohol:  reports no history of alcohol use. Tobacco:  reports that she quit smoking about 5 years ago. Her smoking use included cigarettes. She started smoking about 45 years ago. She has a 60  pack-year smoking history. She has never used smokeless tobacco. Social: Elizabeth [name]  reports that she quit smoking about 5 years ago. Her smoking use included cigarettes. She started smoking about 45 years ago. She has a 60 pack-year smoking history. She has never used smokeless tobacco. She reports that she does not drink alcohol and does not use drugs. Medical:  has a past medical history of Anxiety, Arm pain (07/25/2015), CHF (congestive heart failure) (HCC), Congestive heart failure (HCC) (07/25/2015), COPD (chronic obstructive pulmonary disease) (HCC), Depression, Hypertension, Lower extremity pain (07/25/2015), Reflux, RSD (reflex sympathetic dystrophy), Spinal cord stimulator status, Vitamin B12 deficiency, and Vitamin D deficiency. Surgical: Elizabeth [name]  has a past surgical history that includes Spinal cord stimulator implant; Breast biopsy (Right, 05/10/2023); and Breast biopsy (Right, 05/10/2023). Family: family history includes Cancer in her father; Cirrhosis in her mother.  Laboratory Chemistry Profile   Renal Lab Results  Component Value Date   BUN 19 10/03/2022   CREATININE 1.01 (H) 10/03/2022   BCR 17 08/30/2017   GFRAA >60 08/18/2018   GFRNONAA >60 10/03/2022    Hepatic Lab Results  Component Value Date   AST 15 05/21/2022   ALT 20 05/21/2022   ALBUMIN 3.9 05/21/2022   ALKPHOS 83 05/21/2022    Electrolytes Lab Results  Component Value Date   NA 140 10/03/2022   K 4.6 10/03/2022   CL 103 10/03/2022   CALCIUM 9.0 10/03/2022   MG 2.8 (H) 05/20/2022   PHOS 3.4 04/20/2021    Bone Lab Results  Component Value Date   VD25OH 25.3 (L) 03/18/2016   VD125OH2TOT 76.2 03/18/2016   25OHVITD1 37 08/30/2017   25OHVITD2 1.0 08/30/2017   25OHVITD3 36 08/30/2017    Inflammation (CRP: Acute Phase) (ESR: Chronic Phase) Lab Results  Component Value Date   CRP 4.4 08/30/2017   ESRSEDRATE 30 08/30/2017         Note: Above Lab results reviewed.  Recent Imaging Review  US  Abdomen Limited RUQ (LIVER/GB) CLINICAL DATA:  Elita Boone  EXAM: ULTRASOUND ABDOMEN LIMITED RIGHT UPPER QUADRANT  COMPARISON:  CT abdomen pelvis 07/29/2022  FINDINGS: Gallbladder:  Stones and sludge in the gallbladder lumen. No gallbladder wall thickening or pericholecystic fluid. Negative sonographic Murphy's sign.  Common bile duct:  Diameter: 4.6 mm  Liver:  No focal lesion identified. Within normal limits in parenchymal echogenicity. Portal vein is patent on color Doppler imaging with normal direction of blood flow towards the liver.  Other: None.  IMPRESSION: Cholelithiasis without secondary signs of acute cholecystitis.  Electronically Signed   By:  Annia Belt M.D.   On: 08/30/2023 12:40 Note: Reviewed        Physical Exam  General appearance: Well nourished, well developed, and well hydrated. In no apparent acute distress Mental status: Alert, oriented x 3 (person, place, & time)       Respiratory: No evidence of acute respiratory distress Eyes: PERLA Vitals: BP 125/63   Pulse 79   Temp (!) 97.3 F (36.3 C) (Temporal)   Resp 17   Ht 4\' 9"  (1.448 m)   Wt 135 lb (61.2 kg)   SpO2 97%   BMI 29.21 kg/m  BMI: Estimated body mass index is 29.21 kg/m as calculated from the following:   Height as of this encounter: 4\' 9"  (1.448 m).   Weight as of this encounter: 135 lb (61.2 kg). Ideal: Patient must be at least 60 in tall to calculate ideal body weight  Assessment   Diagnosis Status  1. Chronic lower extremity pain (1ry area of Pain) (Bilateral)   2. Chronic low back pain (Bilateral) w/o sciatica   3. Lumbar spondylosis   4. Lumbar facet syndrome (Bilateral)   5. Complex regional pain syndrome type 1 of lower extremities (Bilateral)   6. Complex regional pain syndrome type 1 of upper extremities (Bilateral)   7. Chronic pain syndrome   8. Pharmacologic therapy   9. Chronic use of opiate for therapeutic purpose   10. Encounter for medication management    11. Encounter for chronic pain management    Controlled Controlled Controlled   Updated Problems: No problems updated.  Plan of Care  Problem-specific:  Assessment and Plan    Chronic Pain in Right Hand Chronic pain in the right hand is well-managed with current medication, with no adverse reactions reported. The PMP is up to date. A urine sample is needed at the next visit for compliance monitoring. Send a prescription to the pharmacy for the next three months and request a urine sample at the next visit.  General Health Maintenance Routine health maintenance and medication compliance monitoring are ongoing. Review and update the PMP regularly. Monitor for side effects or adverse reactions to pain medication.  Follow-up Schedule a follow-up visit in three months.      Elizabeth [name] has a current medication list which includes the following long-term medication(s): albuterol, albuterol, atorvastatin, carvedilol, [START ON 12/16/2023] hydrocodone-acetaminophen, [START ON 01/15/2024] hydrocodone-acetaminophen, [START ON 02/14/2024] hydrocodone-acetaminophen, metoprolol tartrate, mometasone-formoterol, and torsemide.  Pharmacotherapy (Medications Ordered): Meds ordered this encounter  Medications   HYDROcodone-acetaminophen (NORCO/VICODIN) 5-325 MG tablet    Sig: Take 1 tablet by mouth every 6 (six) hours as needed for severe pain (pain score 7-10). Must last 30 days.    Dispense:  120 tablet    Refill:  0    DO NOT: delete (not duplicate); no partial-fill (will deny script to complete), no refill request (F/U required). DISPENSE: 1 day early if closed on fill date. WARN: No CNS-depressants within 8 hrs of med.   HYDROcodone-acetaminophen (NORCO/VICODIN) 5-325 MG tablet    Sig: Take 1 tablet by mouth every 6 (six) hours as needed for severe pain (pain score 7-10). Must last 30 days.    Dispense:  120 tablet    Refill:  0    DO NOT: delete (not duplicate); no partial-fill  (will deny script to complete), no refill request (F/U required). DISPENSE: 1 day early if closed on fill date. WARN: No CNS-depressants within 8 hrs of med.   HYDROcodone-acetaminophen (NORCO/VICODIN) 5-325 MG  tablet    Sig: Take 1 tablet by mouth every 6 (six) hours as needed for severe pain (pain score 7-10). Must last 30 days.    Dispense:  120 tablet    Refill:  0    DO NOT: delete (not duplicate); no partial-fill (will deny script to complete), no refill request (F/U required). DISPENSE: 1 day early if closed on fill date. WARN: No CNS-depressants within 8 hrs of med.   Orders:  No orders of the defined types were placed in this encounter.  Follow-up plan:   Return in about 3 months (around 03/15/2024) for Eval-day (M,W), (F2F), (MM).      Interventional Therapies  Risk Factors  Considerations:      Planned  Pending:      Under consideration:   Diagnostic bilateral lumbar facet MBB  Possible bilateral lumbar sympathetic RFA    Completed:   Therapeutic right stellate ganglion Blk (SGB) (Multiple)  Therapeutic left stellate ganglion Blk (SGB) (Multiple)  Therapeutic right lumbar sympathetic Blk (LSB) (Multiple)  Hospital admission for therapeutic cervical epidural local anesthetic infusion (Multiple)  Hospital admission for therapeutic Lumbar epidural local anesthetic infusion (Multiple)    Therapeutic  Palliative (PRN) options:   Palliative SGB  Palliative  LSB      Recent Visits No visits were found meeting these conditions. Showing recent visits within past 90 days and meeting all other requirements Today's Visits Date Type Provider Dept  12/13/23 Office Visit Delano Metz, MD [facility]  Showing today's visits and meeting all other requirements Future Appointments Date Type Provider Dept  02/09/24 Appointment Delano Metz, MD [facility]  Showing future appointments within next 90 days and meeting all other requirements  I  discussed the assessment and treatment plan with the patient. The patient was provided an opportunity to ask questions and all were answered. The patient agreed with the plan and demonstrated an understanding of the instructions.  Patient advised to call back or seek an in-person evaluation if the symptoms or condition worsens.  Duration of encounter: 30 minutes.  Total time on encounter, as per AMA guidelines included both the face-to-face and non-face-to-face time personally spent by the physician and/or other qualified health care professional(s) on the day of the encounter (includes time in activities that require the physician or other qualified health care professional and does not include time in activities normally performed by clinical staff). Physician's time may include the following activities when performed: Preparing to see the patient (e.g., pre-charting review of records, searching for previously ordered imaging, lab work, and nerve conduction tests) Review of prior analgesic pharmacotherapies. Reviewing PMP Interpreting ordered tests (e.g., lab work, imaging, nerve conduction tests) Performing post-procedure evaluations, including interpretation of diagnostic procedures Obtaining and/or reviewing separately obtained history Performing a medically appropriate examination and/or evaluation Counseling and educating the patient/family/caregiver Ordering medications, tests, or procedures Referring and communicating with other health care professionals (when not separately reported) Documenting clinical information in the electronic or other health record Independently interpreting results (not separately reported) and communicating results to the patient/ family/caregiver Care coordination (not separately reported)  Note by: Oswaldo Done, MD Date: 12/13/2023; Time: 1:20 PM

## 2023-12-13 ENCOUNTER — Encounter: Payer: Self-pay | Admitting: Pain Medicine

## 2023-12-13 ENCOUNTER — Ambulatory Visit: Payer: Medicaid Other | Attending: Pain Medicine | Admitting: Pain Medicine

## 2023-12-13 DIAGNOSIS — M545 Low back pain, unspecified: Secondary | ICD-10-CM | POA: Insufficient documentation

## 2023-12-13 DIAGNOSIS — G8929 Other chronic pain: Secondary | ICD-10-CM | POA: Insufficient documentation

## 2023-12-13 DIAGNOSIS — G90523 Complex regional pain syndrome I of lower limb, bilateral: Secondary | ICD-10-CM | POA: Insufficient documentation

## 2023-12-13 DIAGNOSIS — G894 Chronic pain syndrome: Secondary | ICD-10-CM | POA: Diagnosis present

## 2023-12-13 DIAGNOSIS — M47816 Spondylosis without myelopathy or radiculopathy, lumbar region: Secondary | ICD-10-CM | POA: Diagnosis not present

## 2023-12-13 DIAGNOSIS — Z79891 Long term (current) use of opiate analgesic: Secondary | ICD-10-CM | POA: Insufficient documentation

## 2023-12-13 DIAGNOSIS — G90513 Complex regional pain syndrome I of upper limb, bilateral: Secondary | ICD-10-CM | POA: Insufficient documentation

## 2023-12-13 DIAGNOSIS — Z79899 Other long term (current) drug therapy: Secondary | ICD-10-CM | POA: Diagnosis present

## 2023-12-13 DIAGNOSIS — M79604 Pain in right leg: Secondary | ICD-10-CM | POA: Diagnosis not present

## 2023-12-13 DIAGNOSIS — M79605 Pain in left leg: Secondary | ICD-10-CM | POA: Insufficient documentation

## 2023-12-13 MED ORDER — HYDROCODONE-ACETAMINOPHEN 5-325 MG PO TABS: 1.0000 | ORAL_TABLET | Freq: Four times a day (QID) | ORAL | 0 refills | Status: DC | PRN

## 2023-12-13 NOTE — Progress Notes (Signed)
 Nursing Pain Medication Assessment:  Safety precautions to be maintained throughout the outpatient stay will include: orient to surroundings, keep bed in low position, maintain call bell within reach at all times, provide assistance with transfer out of bed and ambulation.  Medication Inspection Compliance: Pill count conducted under aseptic conditions, in front of the patient. Neither the pills nor the bottle was removed from the patient's sight at any time. Once count was completed pills were immediately returned to the patient in their original bottle.  Medication: Hydrocodone/APAP Pill/Patch Count:  10 of 120 pills remain Pill/Patch Appearance: Markings consistent with prescribed medication Bottle Appearance: Standard pharmacy container. Clearly labeled. Filled Date: 02 / 04 / 2025 Last Medication intake:  Today

## 2024-02-09 ENCOUNTER — Encounter: Admitting: Pain Medicine

## 2024-02-24 ENCOUNTER — Inpatient Hospital Stay
Admission: EM | Admit: 2024-02-24 | Discharge: 2024-03-03 | DRG: 189 | Disposition: A | Discharge status: Home / Self Care | Attending: Internal Medicine | Admitting: Internal Medicine

## 2024-02-24 ENCOUNTER — Other Ambulatory Visit: Payer: Self-pay

## 2024-02-24 ENCOUNTER — Emergency Department

## 2024-02-24 ENCOUNTER — Encounter: Payer: Self-pay | Admitting: Emergency Medicine

## 2024-02-24 DIAGNOSIS — Z6837 Body mass index (BMI) 37.0-37.9, adult: Secondary | ICD-10-CM | POA: Diagnosis not present

## 2024-02-24 DIAGNOSIS — E785 Hyperlipidemia, unspecified: Secondary | ICD-10-CM | POA: Diagnosis present

## 2024-02-24 DIAGNOSIS — M545 Low back pain, unspecified: Secondary | ICD-10-CM | POA: Diagnosis present

## 2024-02-24 DIAGNOSIS — J9601 Acute respiratory failure with hypoxia: Secondary | ICD-10-CM | POA: Diagnosis not present

## 2024-02-24 DIAGNOSIS — E876 Hypokalemia: Secondary | ICD-10-CM | POA: Diagnosis not present

## 2024-02-24 DIAGNOSIS — Z888 Allergy status to other drugs, medicaments and biological substances status: Secondary | ICD-10-CM

## 2024-02-24 DIAGNOSIS — J441 Chronic obstructive pulmonary disease with (acute) exacerbation: Principal | ICD-10-CM | POA: Diagnosis present

## 2024-02-24 DIAGNOSIS — J209 Acute bronchitis, unspecified: Secondary | ICD-10-CM | POA: Diagnosis present

## 2024-02-24 DIAGNOSIS — K5900 Constipation, unspecified: Secondary | ICD-10-CM | POA: Diagnosis present

## 2024-02-24 DIAGNOSIS — D519 Vitamin B12 deficiency anemia, unspecified: Secondary | ICD-10-CM | POA: Diagnosis present

## 2024-02-24 DIAGNOSIS — I5032 Chronic diastolic (congestive) heart failure: Secondary | ICD-10-CM | POA: Diagnosis present

## 2024-02-24 DIAGNOSIS — Z87891 Personal history of nicotine dependence: Secondary | ICD-10-CM

## 2024-02-24 DIAGNOSIS — I130 Hypertensive heart and chronic kidney disease with heart failure and stage 1 through stage 4 chronic kidney disease, or unspecified chronic kidney disease: Secondary | ICD-10-CM | POA: Diagnosis present

## 2024-02-24 DIAGNOSIS — G8929 Other chronic pain: Secondary | ICD-10-CM | POA: Diagnosis present

## 2024-02-24 DIAGNOSIS — Z79899 Other long term (current) drug therapy: Secondary | ICD-10-CM

## 2024-02-24 DIAGNOSIS — Z809 Family history of malignant neoplasm, unspecified: Secondary | ICD-10-CM

## 2024-02-24 DIAGNOSIS — D509 Iron deficiency anemia, unspecified: Secondary | ICD-10-CM | POA: Diagnosis present

## 2024-02-24 DIAGNOSIS — R0602 Shortness of breath: Principal | ICD-10-CM

## 2024-02-24 DIAGNOSIS — J9621 Acute and chronic respiratory failure with hypoxia: Principal | ICD-10-CM | POA: Diagnosis present

## 2024-02-24 DIAGNOSIS — Z8379 Family history of other diseases of the digestive system: Secondary | ICD-10-CM

## 2024-02-24 DIAGNOSIS — F419 Anxiety disorder, unspecified: Secondary | ICD-10-CM | POA: Diagnosis present

## 2024-02-24 DIAGNOSIS — J449 Chronic obstructive pulmonary disease, unspecified: Secondary | ICD-10-CM | POA: Diagnosis present

## 2024-02-24 DIAGNOSIS — Z9682 Presence of neurostimulator: Secondary | ICD-10-CM

## 2024-02-24 DIAGNOSIS — Z9189 Other specified personal risk factors, not elsewhere classified: Secondary | ICD-10-CM | POA: Insufficient documentation

## 2024-02-24 DIAGNOSIS — G894 Chronic pain syndrome: Secondary | ICD-10-CM | POA: Diagnosis present

## 2024-02-24 DIAGNOSIS — N1831 Chronic kidney disease, stage 3a: Secondary | ICD-10-CM | POA: Diagnosis present

## 2024-02-24 DIAGNOSIS — G90523 Complex regional pain syndrome I of lower limb, bilateral: Secondary | ICD-10-CM | POA: Diagnosis present

## 2024-02-24 DIAGNOSIS — E871 Hypo-osmolality and hyponatremia: Secondary | ICD-10-CM | POA: Diagnosis not present

## 2024-02-24 DIAGNOSIS — N179 Acute kidney failure, unspecified: Secondary | ICD-10-CM | POA: Diagnosis present

## 2024-02-24 DIAGNOSIS — E66812 Obesity, class 2: Secondary | ICD-10-CM | POA: Diagnosis present

## 2024-02-24 DIAGNOSIS — D75838 Other thrombocytosis: Secondary | ICD-10-CM | POA: Diagnosis present

## 2024-02-24 DIAGNOSIS — Z7951 Long term (current) use of inhaled steroids: Secondary | ICD-10-CM

## 2024-02-24 DIAGNOSIS — Z9981 Dependence on supplemental oxygen: Secondary | ICD-10-CM

## 2024-02-24 DIAGNOSIS — D531 Other megaloblastic anemias, not elsewhere classified: Secondary | ICD-10-CM | POA: Diagnosis present

## 2024-02-24 DIAGNOSIS — I10 Essential (primary) hypertension: Secondary | ICD-10-CM | POA: Diagnosis present

## 2024-02-24 LAB — CBC
HCT: 30.9 % — ABNORMAL LOW (ref 36.0–46.0)
Hemoglobin: 8.9 g/dL — ABNORMAL LOW (ref 12.0–15.0)
MCH: 22.1 pg — ABNORMAL LOW (ref 26.0–34.0)
MCHC: 28.8 g/dL — ABNORMAL LOW (ref 30.0–36.0)
MCV: 76.9 fL — ABNORMAL LOW (ref 80.0–100.0)
Platelets: 421 10*3/uL — ABNORMAL HIGH (ref 150–400)
RBC: 4.02 MIL/uL (ref 3.87–5.11)
RDW: 17.2 % — ABNORMAL HIGH (ref 11.5–15.5)
WBC: 10.5 10*3/uL (ref 4.0–10.5)
nRBC: 0 % (ref 0.0–0.2)

## 2024-02-24 LAB — BASIC METABOLIC PANEL WITH GFR
Anion gap: 17 — ABNORMAL HIGH (ref 5–15)
BUN: 31 mg/dL — ABNORMAL HIGH (ref 8–23)
CO2: 26 mmol/L (ref 22–32)
Calcium: 9.4 mg/dL (ref 8.9–10.3)
Chloride: 92 mmol/L — ABNORMAL LOW (ref 98–111)
Creatinine, Ser: 1.42 mg/dL — ABNORMAL HIGH (ref 0.44–1.00)
GFR, Estimated: 42 mL/min — ABNORMAL LOW (ref >60)
Glucose, Bld: 141 mg/dL — ABNORMAL HIGH (ref 70–99)
Potassium: 3.5 mmol/L (ref 3.5–5.1)
Sodium: 135 mmol/L (ref 135–145)

## 2024-02-24 LAB — BLOOD GAS, VENOUS
Acid-Base Excess: 5.6 mmol/L — ABNORMAL HIGH (ref 0.0–2.0)
Bicarbonate: 29.8 mmol/L — ABNORMAL HIGH (ref 20.0–28.0)
O2 Saturation: 98.1 %
Patient temperature: 37
pCO2, Ven: 41 mmHg — ABNORMAL LOW (ref 44–60)
pH, Ven: 7.47 — ABNORMAL HIGH (ref 7.25–7.43)
pO2, Ven: 75 mmHg — ABNORMAL HIGH (ref 32–45)

## 2024-02-24 LAB — BRAIN NATRIURETIC PEPTIDE: B Natriuretic Peptide: 13.4 pg/mL (ref 0.0–100.0)

## 2024-02-24 LAB — MRSA NEXT GEN BY PCR, NASAL: MRSA by PCR Next Gen: NOT DETECTED

## 2024-02-24 MED ORDER — HYDROCODONE-ACETAMINOPHEN 5-325 MG PO TABS
1.0000 | ORAL_TABLET | Freq: Four times a day (QID) | ORAL | Status: DC | PRN
  Administered 2024-02-24 – 2024-02-25 (×2): 1 via ORAL
  Filled 2024-02-24 (×2): qty 1

## 2024-02-24 MED ORDER — CARVEDILOL 6.25 MG PO TABS
3.1250 mg | ORAL_TABLET | Freq: Two times a day (BID) | ORAL | Status: DC
  Administered 2024-02-24 – 2024-03-03 (×16): 3.125 mg via ORAL
  Filled 2024-02-24 (×16): qty 1

## 2024-02-24 MED ORDER — ACETAMINOPHEN 325 MG PO TABS
650.0000 mg | ORAL_TABLET | Freq: Four times a day (QID) | ORAL | Status: AC | PRN
  Administered 2024-02-26: 650 mg via ORAL
  Filled 2024-02-24: qty 2

## 2024-02-24 MED ORDER — SENNOSIDES-DOCUSATE SODIUM 8.6-50 MG PO TABS: 1.0000 | ORAL_TABLET | Freq: Every evening | ORAL | Status: DC | PRN

## 2024-02-24 MED ORDER — METHYLPREDNISOLONE SODIUM SUCC 125 MG IJ SOLR
125.0000 mg | Freq: Once | INTRAMUSCULAR | Status: AC
  Administered 2024-02-24: 125 mg via INTRAVENOUS
  Filled 2024-02-24: qty 2

## 2024-02-24 MED ORDER — IPRATROPIUM-ALBUTEROL 0.5-2.5 (3) MG/3ML IN SOLN
3.0000 mL | Freq: Four times a day (QID) | RESPIRATORY_TRACT | Status: AC
  Administered 2024-02-24 – 2024-02-25 (×3): 3 mL via RESPIRATORY_TRACT
  Filled 2024-02-24 (×4): qty 3

## 2024-02-24 MED ORDER — PANTOPRAZOLE SODIUM 40 MG PO TBEC
40.0000 mg | DELAYED_RELEASE_TABLET | Freq: Every day | ORAL | Status: DC
Start: 2024-02-25 — End: 2024-03-03
  Administered 2024-02-25 – 2024-03-03 (×8): 40 mg via ORAL
  Filled 2024-02-24 (×8): qty 1

## 2024-02-24 MED ORDER — FLUTICASONE FUROATE-VILANTEROL 100-25 MCG/ACT IN AEPB
1.0000 | INHALATION_SPRAY | Freq: Every day | RESPIRATORY_TRACT | Status: DC
  Administered 2024-02-25 – 2024-03-02 (×7): 1 via RESPIRATORY_TRACT
  Filled 2024-02-24 (×2): qty 28

## 2024-02-24 MED ORDER — ONDANSETRON HCL 4 MG/2ML IJ SOLN: 4.0000 mg | Freq: Four times a day (QID) | INTRAMUSCULAR | Status: AC | PRN

## 2024-02-24 MED ORDER — SENNOSIDES-DOCUSATE SODIUM 8.6-50 MG PO TABS
1.0000 | ORAL_TABLET | Freq: Two times a day (BID) | ORAL | Status: DC
Start: 2024-02-24 — End: 2024-02-26
  Administered 2024-02-24 – 2024-02-26 (×2): 1 via ORAL
  Filled 2024-02-24 (×4): qty 1

## 2024-02-24 MED ORDER — POLYETHYLENE GLYCOL 3350 17 G PO PACK
17.0000 g | PACK | Freq: Two times a day (BID) | ORAL | Status: AC | PRN
  Administered 2024-02-27 – 2024-02-28 (×2): 17 g via ORAL
  Filled 2024-02-24 (×2): qty 1

## 2024-02-24 MED ORDER — IPRATROPIUM-ALBUTEROL 0.5-2.5 (3) MG/3ML IN SOLN: 3.0000 mL | Freq: Four times a day (QID) | RESPIRATORY_TRACT | Status: DC

## 2024-02-24 MED ORDER — ACETAMINOPHEN 650 MG RE SUPP: 650.0000 mg | Freq: Four times a day (QID) | RECTAL | Status: AC | PRN

## 2024-02-24 MED ORDER — ONDANSETRON HCL 4 MG PO TABS: 4.0000 mg | ORAL_TABLET | Freq: Four times a day (QID) | ORAL | Status: AC | PRN

## 2024-02-24 MED ORDER — METHYLPREDNISOLONE SODIUM SUCC 40 MG IJ SOLR
40.0000 mg | Freq: Every day | INTRAMUSCULAR | Status: AC
  Administered 2024-02-25: 40 mg via INTRAVENOUS
  Filled 2024-02-24: qty 1

## 2024-02-24 MED ORDER — ATORVASTATIN CALCIUM 20 MG PO TABS
40.0000 mg | ORAL_TABLET | Freq: Every day | ORAL | Status: DC
  Administered 2024-02-24 – 2024-03-02 (×8): 40 mg via ORAL
  Filled 2024-02-24 (×8): qty 2

## 2024-02-24 MED ORDER — CHLORHEXIDINE GLUCONATE CLOTH 2 % EX PADS
6.0000 | MEDICATED_PAD | Freq: Every day | CUTANEOUS | Status: DC
  Administered 2024-02-24 – 2024-02-25 (×2): 6 via TOPICAL

## 2024-02-24 MED ORDER — SPIRONOLACTONE 25 MG PO TABS
25.0000 mg | ORAL_TABLET | Freq: Every day | ORAL | Status: DC
  Administered 2024-02-25: 25 mg via ORAL
  Filled 2024-02-24: qty 1

## 2024-02-24 MED ORDER — HEPARIN SODIUM (PORCINE) 5000 UNIT/ML IJ SOLN
5000.0000 [IU] | Freq: Three times a day (TID) | INTRAMUSCULAR | Status: DC
  Administered 2024-02-24 – 2024-03-03 (×23): 5000 [IU] via SUBCUTANEOUS
  Filled 2024-02-24 (×24): qty 1

## 2024-02-24 MED ORDER — ALBUTEROL SULFATE (2.5 MG/3ML) 0.083% IN NEBU
10.0000 mg | INHALATION_SOLUTION | Freq: Once | RESPIRATORY_TRACT | Status: AC
  Administered 2024-02-24: 10 mg via RESPIRATORY_TRACT
  Filled 2024-02-24: qty 12

## 2024-02-24 MED ORDER — LORAZEPAM 2 MG/ML IJ SOLN
0.5000 mg | Freq: Four times a day (QID) | INTRAMUSCULAR | Status: AC | PRN
  Administered 2024-02-24 – 2024-02-25 (×2): 0.5 mg via INTRAVENOUS
  Filled 2024-02-24 (×2): qty 1

## 2024-02-24 MED ORDER — NICOTINE 14 MG/24HR TD PT24: 14.0000 mg | MEDICATED_PATCH | Freq: Every day | TRANSDERMAL | Status: DC | PRN

## 2024-02-24 MED ORDER — IPRATROPIUM-ALBUTEROL 0.5-2.5 (3) MG/3ML IN SOLN
6.0000 mL | Freq: Once | RESPIRATORY_TRACT | Status: AC
  Administered 2024-02-24: 6 mL via RESPIRATORY_TRACT

## 2024-02-24 MED ORDER — HYDROCODONE-ACETAMINOPHEN 5-325 MG PO TABS
1.0000 | ORAL_TABLET | Freq: Once | ORAL | Status: AC
  Administered 2024-02-24: 1 via ORAL
  Filled 2024-02-24: qty 1

## 2024-02-24 MED ORDER — HYDRALAZINE HCL 20 MG/ML IJ SOLN: 5.0000 mg | Freq: Four times a day (QID) | INTRAMUSCULAR | Status: AC | PRN

## 2024-02-24 NOTE — ED Triage Notes (Signed)
 Patient to ED via POV for wheezing/SOB. Sent from [facility] pulmonology. SOB all day. Unable to speak in full sentences.  Having bilateral leg swelling and abd.

## 2024-02-24 NOTE — Assessment & Plan Note (Addendum)
 Senna docusate, 1 tablet twice daily scheduled GlycoLax  17 g p.o. twice daily as needed for moderate constipation, 5 days ordered

## 2024-02-24 NOTE — Hospital Course (Addendum)
 63yo with h/o HTN, HLD, class 2 obesity, tobacco dependence, and chronic pain who presented on 5/15 with SOB.  She was diagnosed with COPD with exacerbation and initially required BIPAP -> [place] O2.  She has been treated with steroids and nebulizer treatments.

## 2024-02-24 NOTE — Plan of Care (Signed)

## 2024-02-24 NOTE — Assessment & Plan Note (Signed)
Home atorvastatin 40 mg nightly resumed

## 2024-02-24 NOTE — Assessment & Plan Note (Addendum)
 Carvedilol  3.125 mg p.o. twice daily with meals resumed, spironolactone  25 mg daily Hydralazine  5 mg IV every 6 hours as needed for SBP > 170, 5 days ordered

## 2024-02-24 NOTE — Assessment & Plan Note (Addendum)
 Ativan  0.5 mg IV every 6 hours prn for anxiety, 2 doses ordered

## 2024-02-24 NOTE — ED Notes (Signed)
 EDP and RT notified. RT at [facility]. Duoneb 6ml started.

## 2024-02-24 NOTE — ED Notes (Signed)
 Xray at [facility]

## 2024-02-24 NOTE — H&P (Addendum)
 History and Physical   Elizabeth [name] DGU:[id number] DOB: 1961/03/01 DOA: 02/24/2024  PCP: Evelena Hines, FNP (Inactive)  Outpatient Specialists: Dr. Jaclynn Mast, pulmonologist Patient coming from: shortness of breath  I have personally briefly reviewed patient's old medical records in [facility] EMR.  Chief Concern: shortness of breath  HPI: [name] is a 63 year old female with history of hypertension, hyperlipidemia, truncal obesity, GERD, chronic low back pain without sciatica, chronic pain syndrome, who presents emergency department for chief concerns of shortness of breath.  Patient reports that she just started getting shortness of breath this morning.  Vitals in the ED showed t of 98.2, rr 37, hr of 87, blood pressure 124/74, SpO2 100% on BiPAP therapy  Serum sodium is 135, potassium 3.4, chloride 92, bicarb 26, BUN of 31, serum creatinine 1.42, EGFR 42, nonfasting blood glucose 141, WBC 10.5, hemoglobin 8.9, platelets of 421.  BNP was 13.4.  ED treatment: DuoNebs one-time treatment, albuterol  nebulizer one-time treatment, Solu-Medrol  125 mg IV one-time dose.  Patient was placed on BiPAP therapy. ------------------------------------- At bedside, patient is able to tell me her first and last name, age, location, current calendar year.  She reports she just started getting shortness of breath this morning.  She reports there is a cough but is nonproductive.  She reports that the fluid in the swelling treatments have worked with Dr. Jaclynn Mast outpatient.  She denies chest pain, dysuria, hematuria, diarrhea, blood in her stool.  She reports the close does not feel tight at this time.  She denies fever, nausea, vomiting.  She reports the mask and the breathing treatments have helped her symptoms a little bit.  Social history: She lives at home with her son.  She denies tobacco, EtOH, recreational drug use.  She is currently not working at this time, she states she  is retired.  ROS: Constitutional: no weight change, no fever ENT/Mouth: no sore throat, no rhinorrhea Eyes: no eye pain, no vision changes Cardiovascular: no chest pain, + dyspnea,  no edema, no palpitations Respiratory: + cough, no sputum, no wheezing Gastrointestinal: no nausea, no vomiting, no diarrhea, no constipation Genitourinary: no urinary incontinence, no dysuria, no hematuria Musculoskeletal: no arthralgias, no myalgias Skin: no skin lesions, no pruritus, Neuro: + weakness, no loss of consciousness, no syncope Psych: no anxiety, no depression, + decrease appetite Heme/Lymph: no bruising, no bleeding  ED Course: Discussed with EDP, patient requiring hospitalization for chief concerns of COPD exacerbation.  Assessment/Plan  Principal Problem:   Acute hypoxic respiratory failure (HCC) Active Problems:   Chronic diastolic CHF (congestive heart failure) (HCC)   Anxiety   COPD exacerbation (HCC)   HLD (hyperlipidemia)   HTN (hypertension)   Constipation   Chronic pain syndrome   Chronic low back pain (Bilateral) w/o sciatica   COPD (chronic obstructive pulmonary disease) (HCC)   Complex regional pain syndrome type 1 of lower extremities (Bilateral)   History of tobacco abuse   Morbid (severe) obesity due to excess calories (HCC)   At risk for constipation   Assessment and Plan:  * Acute hypoxic respiratory failure (HCC) Query COPD exacerbation Continue BiPAP therapy DuoNebs 4 times daily, 4 doses on admission Solu-Medrol  40 mg IV one-time dose ordered for 02/25/2024 Continuous pulse oximetry maintain SpO2 greater than 92%  Anxiety Ativan  0.5 mg IV every 6 hours prn for anxiety, 2 doses ordered  HLD (hyperlipidemia) Home atorvastatin  40 mg nightly resumed  HTN (hypertension) Carvedilol  3.125 mg p.o. twice daily with meals resumed, spironolactone  25  mg daily Hydralazine  5 mg IV every 6 hours as needed for SBP > 170, 5 days ordered  Chronic pain syndrome PDMP  reviewed Patient currently has active prescription for hydrocodone -acetaminophen  5-325 mg p.o. every 6 hours.  For severe pain, this has been resumed on admission  At risk for constipation Senna docusate, 1 tablet twice daily scheduled GlycoLax  17 g p.o. twice daily as needed for moderate constipation, 5 days ordered  Morbid (severe) obesity due to excess calories (HCC) This complicates overall care and prognosis.   Chart reviewed.   DVT prophylaxis: Heparin  5000 units subcutaneous every 8 hours Code Status: Full code Diet: npo as patient is on bipap therapy Family Communication: A phone call was offered, patient declined stating that her son already knows she is being admitted to the hospital Disposition Plan: Pending clinical course Consults called: None at this time Admission status: Stepdown, inpatient  Past Medical History:  Diagnosis Date   Anxiety    Arm pain 07/25/2015   CHF (congestive heart failure) (HCC)    Congestive heart failure (HCC) 07/25/2015   COPD (chronic obstructive pulmonary disease) (HCC)    never been idagnosed   Depression    Hypertension    Lower extremity pain 07/25/2015   Reflux    RSD (reflex sympathetic dystrophy)    Spinal cord stimulator status    Vitamin B12 deficiency    Vitamin D  deficiency    Past Surgical History:  Procedure Laterality Date   BREAST BIOPSY Right 05/10/2023   us  bx/ venus clip/ path pending   BREAST BIOPSY Right 05/10/2023   US  RT BREAST BX W LOC DEV 1ST LESION IMG BX SPEC US  GUIDE 05/10/2023 [facility]   SPINAL CORD STIMULATOR IMPLANT     x 2   Social History:  reports that she quit smoking about 5 years ago. Her smoking use included cigarettes. She started smoking about 45 years ago. She has a 60 pack-year smoking history. She has never used smokeless tobacco. She reports that she does not drink alcohol and does not use drugs.  Allergies  Allergen Reactions   Lisinopril Anaphylaxis    Other Reaction(s):  edema of face and throat   Family History  Problem Relation Age of Onset   Cirrhosis Mother    Cancer Father    Family history: Family history reviewed and not pertinent.  Prior to Admission medications   Medication Sig Start Date End Date Taking? Authorizing Provider  albuterol  (PROVENTIL  HFA;VENTOLIN  HFA) 108 (90 Base) MCG/ACT inhaler Inhale 2 puffs into the lungs every 6 (six) hours as needed for wheezing or shortness of breath. 11/26/17   Rex Castor, MD  albuterol  (PROVENTIL ) (2.5 MG/3ML) 0.083% nebulizer solution Take 3 mLs (2.5 mg total) by nebulization every 4 (four) hours as needed for shortness of breath or wheezing. 08/02/22 12/13/23  Alphonsus Jeans, MD  atorvastatin  (LIPITOR) 40 MG tablet Take 1 tablet (40 mg total) by mouth at bedtime. 08/18/18   Rex Castor, MD  carvedilol  (COREG ) 3.125 MG tablet Take 1 tablet (3.125 mg total) by mouth 2 (two) times daily with a meal. 08/18/18   Rex Castor, MD  HYDROcodone -acetaminophen  (NORCO/VICODIN) 5-325 MG tablet Take 1 tablet by mouth every 6 (six) hours as needed for severe pain (pain score 7-10). Must last 30 days. 12/16/23 01/15/24  Renaldo Caroli, MD  HYDROcodone -acetaminophen  (NORCO/VICODIN) 5-325 MG tablet Take 1 tablet by mouth every 6 (six) hours as needed for severe pain (pain score 7-10). Must last 30 days. 01/15/24 02/14/24  [name], Francisco, MD  HYDROcodone -acetaminophen  (NORCO/VICODIN) 5-325 MG tablet Take 1 tablet by mouth every 6 (six) hours as needed for severe pain (pain score 7-10). Must last 30 days. 02/14/24 03/15/24  Renaldo Caroli, MD  metoprolol  tartrate (LOPRESSOR ) 100 MG tablet Take 1 tablet (100 mg total) by mouth once for 1 dose. Please take one time dose 100mg  metoprolol  tartrate 2 hr prior to cardiac CT for HR control IF HR >55bpm. 05/28/23 12/13/23  Constancia Delton, MD  mometasone -formoterol  (DULERA ) 100-5 MCG/ACT AERO Inhale 2 puffs into the lungs 2 (two) times daily. 08/18/18   Rex Castor, MD  naloxone  (NARCAN ) nasal spray 4 mg/0.1 mL Place 1 spray into the nose as needed for up to 365 doses (for opioid-induced respiratory depresssion). In case of emergency (overdose), spray once into each nostril. If no response within 3 minutes, repeat application and call 911. 03/17/23 03/16/24  Renaldo Caroli, MD  pantoprazole  (PROTONIX ) 40 MG tablet Take 40 mg by mouth daily.    [provider]  potassium chloride  SA (KLOR-CON  M) 20 MEQ tablet Take 20 mEq by mouth daily.    [provider]  spironolactone  (ALDACTONE ) 25 MG tablet Take 25 mg by mouth daily.    [provider]  torsemide  (DEMADEX ) 20 MG tablet Take 1 tablet (20 mg total) by mouth daily. 05/26/22   Tiajuana Fluke, MD   Physical Exam: Vitals:   02/24/24 1500 02/24/24 1515 02/24/24 1530 02/24/24 1545  BP: 114/60 116/63 119/63 (!) 110/59  Pulse: 73 74 76 78  Resp: (!) 27 (!) 23 (!) 26 15  Temp:      TempSrc:      SpO2: 100% 99% 100% 98%  Weight:      Height:       Constitutional: appears age appropriate, anxious Eyes: PERRL, lids and conjunctivae normal ENMT: Mucous membranes are moist. Posterior pharynx clear of any exudate or lesions. Age-appropriate dentition. Hearing appropriate Neck: normal, supple, no masses, no thyromegaly Respiratory: BiPAP lung sounds, clear to auscultation bilaterally. + mild end inspiratory wheezing, no crackles. Normal respiratory effort. No accessory muscle use.  Cardiovascular: Regular rate and rhythm, no murmurs / rubs / gallops. No extremity edema. 2+ pedal pulses. No carotid bruits.  Abdomen: obese abdomen, no tenderness, no masses palpated, no hepatosplenomegaly. Bowel sounds positive.  Musculoskeletal: no clubbing / cyanosis. No joint deformity upper and lower extremities. Good ROM, no contractures, no atrophy. Normal muscle tone.  Skin: no rashes, lesions, ulcers. No induration Neurologic: Sensation intact. Strength 5/5 in all 4.  Psychiatric:  Normal judgment and insight. Alert and oriented x 3. Normal mood.   EKG: independently reviewed, showing sinus rhythm with rate of 95, QTc 477  Chest x-ray on Admission: I personally reviewed and I agree with radiologist reading as below.  DG Chest Port 1 View Result Date: 02/24/2024 CLINICAL DATA:  Shortness of breath EXAM: PORTABLE CHEST 1 VIEW COMPARISON:  10/03/2022 FINDINGS: Ascending stimulator leads over the cervicothoracic region. Mild cardiomegaly. Increased bronchitic changes at the bases. No pleural effusion or pneumothorax. Aortic atherosclerosis. IMPRESSION: Mild cardiomegaly. Increased bronchitic changes at the bases, possible bronchitis. Electronically Signed   By: Esmeralda Hedge M.D.   On: 02/24/2024 15:22   Labs on Admission: I have personally reviewed following labs  CBC: Recent Labs  Lab 02/24/24 1354  WBC 10.5  HGB 8.9*  HCT 30.9*  MCV 76.9*  PLT 421*   Basic Metabolic Panel: Recent Labs  Lab 02/24/24 1354  NA 135  K 3.5  CL 92*  CO2 26  GLUCOSE 141*  BUN 31*  CREATININE 1.42*  CALCIUM  9.4   GFR: Estimated Creatinine Clearance: 33.4 mL/min (A) (by C-G formula based on SCr of 1.42 mg/dL (H)).  Urine analysis:    Component Value Date/Time   COLORURINE STRAW (A) 05/24/2018 1150   APPEARANCEUR CLEAR (A) 05/24/2018 1150   LABSPEC 1.005 05/24/2018 1150   PHURINE 5.0 05/24/2018 1150   GLUCOSEU NEGATIVE 05/24/2018 1150   HGBUR NEGATIVE 05/24/2018 1150   BILIRUBINUR NEGATIVE 05/24/2018 1150   KETONESUR NEGATIVE 05/24/2018 1150   PROTEINUR NEGATIVE 05/24/2018 1150   NITRITE NEGATIVE 05/24/2018 1150   LEUKOCYTESUR TRACE (A) 05/24/2018 1150   This document was prepared using Dragon Voice Recognition software and may include unintentional dictation errors.  Dr. Reinhold Carbine [facility]  If 7PM-7AM, please contact overnight-coverage provider If 7AM-7PM, please contact day attending provider [email or web address]  02/24/2024, 4:01 PM

## 2024-02-24 NOTE — Assessment & Plan Note (Signed)
 -  This complicates overall care and prognosis.

## 2024-02-24 NOTE — Assessment & Plan Note (Signed)
 PDMP reviewed Patient currently has active prescription for hydrocodone -acetaminophen  5-325 mg p.o. every 6 hours.  For severe pain, this has been resumed on admission

## 2024-02-24 NOTE — Assessment & Plan Note (Addendum)
 Query COPD exacerbation Continue BiPAP therapy DuoNebs 4 times daily, 4 doses on admission Solu-Medrol  40 mg IV one-time dose ordered for 02/25/2024 Continuous pulse oximetry maintain SpO2 greater than 92%

## 2024-02-24 NOTE — ED Notes (Signed)
 EDP at [facility]

## 2024-02-24 NOTE — ED Provider Notes (Signed)
 [facility] Provider Note   Event Date/Time   First MD Initiated Contact with Patient 02/24/24 1340     (approximate) History  Wheezing  HPI Elizabeth [name] is a 63 y.o. female with a past medical history of COPD and recent history of anasarca that has been difficult to control who presents from pulmonology clinic with concerns of respiratory distress and hypoxia.  Patient states she has been taking all her medications on time as prescribed.  Patient denies any recent travel or sick contacts.  Patient states she has had worsening shortness of breath over the last few weeks and has been being treated for volume overload with spironolactone , metolazone, and torsemide  ROS: Patient currently denies any vision changes, tinnitus, difficulty speaking, facial droop, sore throat, chest pain, abdominal pain, nausea/vomiting/diarrhea, dysuria, or weakness/numbness/paresthesias in any extremity   Physical Exam  Triage Vital Signs: ED Triage Vitals  Encounter Vitals Group     BP 02/24/24 1339 (!) 149/88     Systolic BP Percentile --      Diastolic BP Percentile --      Pulse Rate 02/24/24 1339 89     Resp 02/24/24 1339 17     Temp 02/24/24 1339 98.2 F (36.8 C)     Temp Source 02/24/24 1339 Oral     SpO2 02/24/24 1339 95 %     Weight 02/24/24 1338 160 lb (72.6 kg)     Height 02/24/24 1338 4\' 9"  (1.448 m)     Head Circumference --      Peak Flow --      Pain Score 02/24/24 1337 0     Pain Loc --      Pain Education --      Exclude from Growth Chart --    Most recent vital signs: Vitals:   02/25/24 1200 02/25/24 1520  BP: 113/62   Pulse: 81   Resp: (!) 23   Temp: 98.1 F (36.7 C)   SpO2: 96% 97%   General: Awake, oriented x4. CV:  Good peripheral perfusion.  Resp:  Normal effort.  Abd:  No distention.  Other:  Middle-aged obese Caucasian female resting comfortably in no acute distress ED Results / Procedures / Treatments  Labs (all labs ordered are  listed, but only abnormal results are displayed) Labs Reviewed  BASIC METABOLIC PANEL WITH GFR - Abnormal; Notable for the following components:      Result Value   Chloride 92 (*)    Glucose, Bld 141 (*)    BUN 31 (*)    Creatinine, Ser 1.42 (*)    GFR, Estimated 42 (*)    Anion gap 17 (*)    All other components within normal limits  CBC - Abnormal; Notable for the following components:   Hemoglobin 8.9 (*)    HCT 30.9 (*)    MCV 76.9 (*)    MCH 22.1 (*)    MCHC 28.8 (*)    RDW 17.2 (*)    Platelets 421 (*)    All other components within normal limits  BLOOD GAS, VENOUS - Abnormal; Notable for the following components:   pH, Ven 7.47 (*)    pCO2, Ven 41 (*)    pO2, Ven 75 (*)    Bicarbonate 29.8 (*)    Acid-Base Excess 5.6 (*)    All other components within normal limits  BASIC METABOLIC PANEL WITH GFR - Abnormal; Notable for the following components:   Sodium 132 (*)  Potassium 3.3 (*)    Chloride 93 (*)    Glucose, Bld 171 (*)    BUN 28 (*)    Creatinine, Ser 1.04 (*)    All other components within normal limits  CBC - Abnormal; Notable for the following components:   RBC 3.60 (*)    Hemoglobin 8.1 (*)    HCT 26.9 (*)    MCV 74.7 (*)    MCH 22.5 (*)    RDW 17.0 (*)    All other components within normal limits  IRON AND TIBC - Abnormal; Notable for the following components:   Iron 24 (*)    TIBC 494 (*)    Saturation Ratios 5 (*)    All other components within normal limits  FERRITIN - Abnormal; Notable for the following components:   Ferritin 5 (*)    All other components within normal limits  VITAMIN B12 - Abnormal; Notable for the following components:   Vitamin B-12 107 (*)    All other components within normal limits  MRSA NEXT GEN BY PCR, NASAL  RESPIRATORY PANEL BY PCR  BRAIN NATRIURETIC PEPTIDE  MAGNESIUM    EKG ED ECG REPORT I, Charleen Conn, the attending physician, personally viewed and interpreted this ECG. Date: 02/24/2024 EKG Time:  1340 Rate: 95 Rhythm: normal sinus rhythm QRS Axis: normal Intervals: normal ST/T Wave abnormalities: normal Narrative Interpretation: no evidence of acute ischemia RADIOLOGY ED MD interpretation: One-view portable chest x-ray interpreted by me shows no evidence mild cardiomegaly with bronchitic changes.  No pneumonia, pneumothorax, or widened mediastinum Official radiology report(s): US  Venous Img Lower Bilateral (DVT) Result Date: 02/25/2024 CLINICAL DATA:  Pain in swelling in the legs bilaterally. EXAM: Bilateral LOWER EXTREMITY VENOUS DOPPLER ULTRASOUND TECHNIQUE: Gray-scale sonography with compression, as well as color and duplex ultrasound, were performed to evaluate the deep venous system(s) from the level of the common femoral vein through the popliteal and proximal calf veins. COMPARISON:  Left lower extremity venous Doppler ultrasound March 16, 2006. Right lower extremity venous Doppler ultrasound August 15, 2010 FINDINGS: VENOUS Normal compressibility of the common femoral, superficial femoral, and popliteal veins, as well as the visualized calf veins. Visualized portions of profunda femoral vein and great saphenous vein unremarkable. No filling defects to suggest DVT on grayscale or color Doppler imaging. Doppler waveforms show normal direction of venous flow, normal respiratory plasticity and response to augmentation. Limited views of the contralateral common femoral vein are unremarkable. OTHER None. Limitations: none IMPRESSION: No DVT bilateral lower extremities. Electronically Signed   By: Susan Ensign   On: 02/25/2024 07:55   PROCEDURES: Critical Care performed: Yes, see critical care procedure note(s) .1-3 Lead EKG Interpretation  Performed by: Charleen Conn, MD Authorized by: Charleen Conn, MD     Interpretation: normal     ECG rate:  81   ECG rate assessment: normal     Rhythm: sinus rhythm     Ectopy: none     Conduction: normal   CRITICAL CARE Performed by:  Shaine Mount K Rubylee Zamarripa  Total critical care time: 33 minutes  Critical care time was exclusive of separately billable procedures and treating other patients.  Critical care was necessary to treat or prevent imminent or life-threatening deterioration.  Critical care was time spent personally by me on the following activities: development of treatment plan with patient and/or surrogate as well as nursing, discussions with consultants, evaluation of patient's response to treatment, examination of patient, obtaining history from patient or surrogate, ordering and  performing treatments and interventions, ordering and review of laboratory studies, ordering and review of radiographic studies, pulse oximetry and re-evaluation of patient's condition.  MEDICATIONS ORDERED IN ED: Medications  acetaminophen  (TYLENOL ) tablet 650 mg (has no administration in time range)    Or  acetaminophen  (TYLENOL ) suppository 650 mg (has no administration in time range)  ondansetron  (ZOFRAN ) tablet 4 mg (has no administration in time range)    Or  ondansetron  (ZOFRAN ) injection 4 mg (has no administration in time range)  heparin  injection 5,000 Units (5,000 Units Subcutaneous Given 02/25/24 1330)  ipratropium-albuterol  (DUONEB) 0.5-2.5 (3) MG/3ML nebulizer solution 3 mL (3 mLs Nebulization Given 02/25/24 1111)  hydrALAZINE  (APRESOLINE ) injection 5 mg (has no administration in time range)  nicotine  (NICODERM CQ  - dosed in mg/24 hours) patch 14 mg (has no administration in time range)  atorvastatin  (LIPITOR) tablet 40 mg (40 mg Oral Given 02/24/24 2133)  carvedilol  (COREG ) tablet 3.125 mg (3.125 mg Oral Given 02/25/24 1555)  fluticasone  furoate-vilanterol (BREO ELLIPTA) 100-25 MCG/ACT 1 puff (1 puff Inhalation Given 02/25/24 0945)  pantoprazole  (PROTONIX ) EC tablet 40 mg (40 mg Oral Given 02/25/24 0851)  senna-docusate (Senokot-S) tablet 1 tablet (1 tablet Oral Patient Refused/Not Given 02/25/24 0851)  polyethylene glycol (MIRALAX  /  GLYCOLAX ) packet 17 g (has no administration in time range)  Chlorhexidine  Gluconate Cloth 2 % PADS 6 each (6 each Topical Given 02/24/24 1622)  albuterol  (PROVENTIL ) (2.5 MG/3ML) 0.083% nebulizer solution 2.5 mg (2.5 mg Nebulization Given 02/25/24 0455)  iron polysaccharides (NIFEREX) capsule 150 mg (150 mg Oral Given 02/25/24 1101)  methylPREDNISolone  sodium succinate (SOLU-MEDROL ) 40 mg/mL injection 40 mg (has no administration in time range)  oxyCODONE -acetaminophen  (PERCOCET/ROXICET) 5-325 MG per tablet 1 tablet (1 tablet Oral Given 02/25/24 1554)  ipratropium-albuterol  (DUONEB) 0.5-2.5 (3) MG/3ML nebulizer solution 3 mL (3 mLs Nebulization Given 02/25/24 1519)  azithromycin  (ZITHROMAX ) tablet 500 mg (500 mg Oral Given 02/25/24 1246)  Oral care mouth rinse (has no administration in time range)  cyanocobalamin (VITAMIN B12) injection 1,000 mcg (1,000 mcg Intramuscular Given 02/25/24 1555)  ipratropium-albuterol  (DUONEB) 0.5-2.5 (3) MG/3ML nebulizer solution 6 mL (6 mLs Nebulization Given 02/24/24 1349)  methylPREDNISolone  sodium succinate (SOLU-MEDROL ) 125 mg/2 mL injection 125 mg (125 mg Intravenous Given 02/24/24 1407)  albuterol  (PROVENTIL ) (2.5 MG/3ML) 0.083% nebulizer solution 10 mg (10 mg Nebulization Given 02/24/24 1425)  methylPREDNISolone  sodium succinate (SOLU-MEDROL ) 40 mg/mL injection 40 mg (40 mg Intravenous Given 02/25/24 0854)  LORazepam  (ATIVAN ) injection 0.5 mg (0.5 mg Intravenous Given 02/25/24 0557)  HYDROcodone -acetaminophen  (NORCO/VICODIN) 5-325 MG per tablet 1 tablet (1 tablet Oral Given 02/24/24 2144)  morphine  (PF) 2 MG/ML injection 2 mg (2 mg Intravenous Given 02/25/24 0045)  morphine  (PF) 2 MG/ML injection 2 mg (2 mg Intravenous Given 02/25/24 0618)  potassium chloride  SA (KLOR-CON  M) CR tablet 40 mEq (40 mEq Oral Given 02/25/24 1247)   IMPRESSION / MDM / ASSESSMENT AND PLAN / ED COURSE  I reviewed the triage vital signs and the nursing notes.                             The  patient is on the cardiac monitor to evaluate for evidence of arrhythmia and/or significant heart rate changes. Patient's presentation is most consistent with acute presentation with potential threat to life or bodily function. The patient appears to be suffering from a moderate/severe exacerbation of COPD.  Based on the history, exam, CXR/EKG reviewed by me, and further workup I don't  suspect any other emergent cause of this presentation, such as pneumonia, acute coronary syndrome, congestive heart failure, pulmonary embolism, or pneumothorax.  ED Interventions: bronchodilators, steroids, antibiotics, reassess  Reassessment: After treatment, the patient's shortness of breath is improving but patient is still requiring supplemental oxygenation with BiPAP  Disposition: Admit   FINAL CLINICAL IMPRESSION(S) / ED DIAGNOSES   Final diagnoses:  Shortness of breath  COPD exacerbation (HCC)  Acute respiratory failure with hypoxia (HCC)   Rx / DC Orders   ED Discharge Orders     None      Note:  This document was prepared using Dragon voice recognition software and may include unintentional dictation errors.   Errik Mitchelle K, MD 02/25/24 [phone]

## 2024-02-24 NOTE — ED Notes (Signed)
 Tolerating Bipap, VSS

## 2024-02-25 ENCOUNTER — Inpatient Hospital Stay

## 2024-02-25 DIAGNOSIS — E871 Hypo-osmolality and hyponatremia: Secondary | ICD-10-CM | POA: Diagnosis present

## 2024-02-25 DIAGNOSIS — G894 Chronic pain syndrome: Secondary | ICD-10-CM

## 2024-02-25 DIAGNOSIS — J441 Chronic obstructive pulmonary disease with (acute) exacerbation: Secondary | ICD-10-CM | POA: Diagnosis not present

## 2024-02-25 DIAGNOSIS — D531 Other megaloblastic anemias, not elsewhere classified: Secondary | ICD-10-CM | POA: Diagnosis present

## 2024-02-25 DIAGNOSIS — D75838 Other thrombocytosis: Secondary | ICD-10-CM | POA: Insufficient documentation

## 2024-02-25 DIAGNOSIS — E876 Hypokalemia: Secondary | ICD-10-CM | POA: Insufficient documentation

## 2024-02-25 DIAGNOSIS — J9601 Acute respiratory failure with hypoxia: Secondary | ICD-10-CM | POA: Diagnosis not present

## 2024-02-25 LAB — BASIC METABOLIC PANEL WITH GFR
Anion gap: 13 (ref 5–15)
BUN: 28 mg/dL — ABNORMAL HIGH (ref 8–23)
CO2: 26 mmol/L (ref 22–32)
Calcium: 9.5 mg/dL (ref 8.9–10.3)
Chloride: 93 mmol/L — ABNORMAL LOW (ref 98–111)
Creatinine, Ser: 1.04 mg/dL — ABNORMAL HIGH (ref 0.44–1.00)
GFR, Estimated: >60 mL/min (ref >60)
Glucose, Bld: 171 mg/dL — ABNORMAL HIGH (ref 70–99)
Potassium: 3.3 mmol/L — ABNORMAL LOW (ref 3.5–5.1)
Sodium: 132 mmol/L — ABNORMAL LOW (ref 135–145)

## 2024-02-25 LAB — CBC
HCT: 26.9 % — ABNORMAL LOW (ref 36.0–46.0)
Hemoglobin: 8.1 g/dL — ABNORMAL LOW (ref 12.0–15.0)
MCH: 22.5 pg — ABNORMAL LOW (ref 26.0–34.0)
MCHC: 30.1 g/dL (ref 30.0–36.0)
MCV: 74.7 fL — ABNORMAL LOW (ref 80.0–100.0)
Platelets: 367 10*3/uL (ref 150–400)
RBC: 3.6 MIL/uL — ABNORMAL LOW (ref 3.87–5.11)
RDW: 17 % — ABNORMAL HIGH (ref 11.5–15.5)
WBC: 8.5 10*3/uL (ref 4.0–10.5)
nRBC: 0 % (ref 0.0–0.2)

## 2024-02-25 LAB — RESPIRATORY PANEL BY PCR

## 2024-02-25 LAB — IRON AND TIBC
Iron: 24 ug/dL — ABNORMAL LOW (ref 28–170)
Saturation Ratios: 5 % — ABNORMAL LOW (ref 10.4–31.8)
TIBC: 494 ug/dL — ABNORMAL HIGH (ref 250–450)
UIBC: 470 ug/dL

## 2024-02-25 LAB — FERRITIN: Ferritin: 5 ng/mL — ABNORMAL LOW (ref 11–307)

## 2024-02-25 LAB — MAGNESIUM: Magnesium: 2.3 mg/dL (ref 1.7–2.4)

## 2024-02-25 LAB — VITAMIN B12: Vitamin B-12: 107 pg/mL — ABNORMAL LOW (ref 180–914)

## 2024-02-25 MED ORDER — MORPHINE SULFATE (PF) 2 MG/ML IV SOLN
2.0000 mg | Freq: Once | INTRAVENOUS | Status: AC
  Administered 2024-02-25: 2 mg via INTRAVENOUS
  Filled 2024-02-25: qty 1

## 2024-02-25 MED ORDER — ALBUTEROL SULFATE (2.5 MG/3ML) 0.083% IN NEBU
2.5000 mg | INHALATION_SOLUTION | RESPIRATORY_TRACT | Status: DC | PRN
  Administered 2024-02-25 – 2024-02-29 (×7): 2.5 mg via RESPIRATORY_TRACT
  Filled 2024-02-25 (×7): qty 3

## 2024-02-25 MED ORDER — POTASSIUM CHLORIDE CRYS ER 20 MEQ PO TBCR
40.0000 meq | EXTENDED_RELEASE_TABLET | ORAL | Status: AC
  Administered 2024-02-25 (×2): 40 meq via ORAL
  Filled 2024-02-25 (×2): qty 2

## 2024-02-25 MED ORDER — AZITHROMYCIN 250 MG PO TABS
500.0000 mg | ORAL_TABLET | Freq: Every day | ORAL | Status: DC
  Administered 2024-02-25 – 2024-02-29 (×5): 500 mg via ORAL
  Filled 2024-02-25 (×5): qty 2

## 2024-02-25 MED ORDER — POTASSIUM CHLORIDE CRYS ER 20 MEQ PO TBCR: 40.0000 meq | EXTENDED_RELEASE_TABLET | Freq: Once | ORAL | Status: DC

## 2024-02-25 MED ORDER — CYANOCOBALAMIN 1000 MCG/ML IJ SOLN
1000.0000 ug | Freq: Every day | INTRAMUSCULAR | Status: AC
  Administered 2024-02-25 – 2024-02-28 (×4): 1000 ug via INTRAMUSCULAR
  Filled 2024-02-25 (×4): qty 1

## 2024-02-25 MED ORDER — IPRATROPIUM-ALBUTEROL 0.5-2.5 (3) MG/3ML IN SOLN
3.0000 mL | Freq: Four times a day (QID) | RESPIRATORY_TRACT | Status: DC
  Administered 2024-02-25 – 2024-02-27 (×7): 3 mL via RESPIRATORY_TRACT
  Filled 2024-02-25 (×6): qty 3

## 2024-02-25 MED ORDER — POLYSACCHARIDE IRON COMPLEX 150 MG PO CAPS
150.0000 mg | ORAL_CAPSULE | Freq: Every day | ORAL | Status: DC
  Administered 2024-02-25: 150 mg via ORAL
  Filled 2024-02-25 (×2): qty 1

## 2024-02-25 MED ORDER — ORAL CARE MOUTH RINSE: 15.0000 mL | OROMUCOSAL | Status: DC | PRN

## 2024-02-25 MED ORDER — METHYLPREDNISOLONE SODIUM SUCC 40 MG IJ SOLR
40.0000 mg | Freq: Every day | INTRAMUSCULAR | Status: DC
  Administered 2024-02-26 – 2024-03-01 (×5): 40 mg via INTRAVENOUS
  Filled 2024-02-25 (×6): qty 1

## 2024-02-25 MED ORDER — OXYCODONE-ACETAMINOPHEN 5-325 MG PO TABS
1.0000 | ORAL_TABLET | ORAL | Status: DC | PRN
  Administered 2024-02-25 – 2024-03-03 (×31): 1 via ORAL
  Filled 2024-02-25 (×32): qty 1

## 2024-02-25 NOTE — Significant Event (Addendum)
       CROSS COVER NOTE  NAME: Elizabeth [name] MRN: [id number] DOB : August 24, 1961 ATTENDING PHYSICIAN: Cox, Trenton Frock, DO    Date of Service   02/25/2024   HPI/Events of Note   Patient without relief from her leg pain despite extra hydrocodone  does HPI patient sent to ER from Dr Jaclynn Mast office for poor response to diuretic regimen and shortness of breath Per his office not 5/15 "Anasarca Persistent anasarca despite aggressive outpatient diuretic therapy with torsemide  200 mg, spironolactone  50 mg, and metolazone 20 mg. She reports minimal diuresis with the current oral regimen. Blood work shows normal potassium levels and no evidence of kidney failure. Due to significant fluid overload, intravenous diuretic therapy is necessary. Oral diuretics at home are insufficient and increasing the dose could be dangerous. Intravenous diuretics have previously resulted in significant weight loss, indicating effective fluid removal. - Recommend evaluation in the emergency room for fluid overload. - Provide a report for the emergency room to facilitate evaluation and management. - Consider intravenous diuretic therapy in the emergency room setting. "  Admission and history/ labs reviewed  Interventions   Assessment/Plan: BLE us  dvt r/o Morphine  2 mg x1 Consider diamox therapy if metabolic alkalosis does not improve with holding of loop diuretics        Kip Peon NP [facility] Cross Cover 7pm-7am - check amion for availability Pager [phone]

## 2024-02-25 NOTE — Progress Notes (Signed)
 Progress Note   Patient: Elizabeth [name] [id number]:[id number] DOB: May 01, 1961 DOA: 02/24/2024     1 DOS: the patient was seen and examined on 02/25/2024   Brief hospital course: Elizabeth [name] is a 63 year old female with history of hypertension, hyperlipidemia, truncal obesity, GERD, chronic low back pain without sciatica, chronic pain syndrome, who presents emergency department for chief concerns of shortness of breath. Chest x-ray showed evidence of bronchitis, BNP normal.  She had a significant respite distress with hypoxemia, was placed on BiPAP.  She is also treated with IV steroids and bronchodilator.   Principal Problem:   Acute hypoxic respiratory failure (HCC) Active Problems:   Chronic diastolic CHF (congestive heart failure) (HCC)   Anxiety   COPD exacerbation (HCC)   HLD (hyperlipidemia)   HTN (hypertension)   Constipation   Chronic pain syndrome   Chronic low back pain (Bilateral) w/o sciatica   COPD (chronic obstructive pulmonary disease) (HCC)   Complex regional pain syndrome type 1 of lower extremities (Bilateral)   History of tobacco abuse   Morbid (severe) obesity due to excess calories (HCC)   At risk for constipation   Hyponatremia   Hypokalemia   Reactive thrombocytosis   Assessment and Plan: * Acute hypoxic respiratory failure (HCC) COPD exacerbation. Acute bronchitis. Patient initially required BiPAP, had significant bronchospasm.  Received high-dose steroids, continued with 40 mg of Solu-Medrol . She is also started on scheduled bronchodilator. Due to acute bronchitis, I will check a respiratory viral panel, currently negative influenza and COVID. Patient condition is better, will transfer out of ICU to progressive unit.  However, we will continue monitor closely.  Hypokalemia Hyponatremia. Acute kidney injury. Replete potassium, patient is also on Aldactone . Patient has GFR of more than 60 at baseline, with worsening renal function.  Consistent  with acute kidney injury, condition had improved to baseline.  Iron deficient anemia. Also check a B12 level, started iron supplement.  No active bleeding.  Anxiety Continue to follow.  HLD (hyperlipidemia) Home atorvastatin  40 mg nightly resumed  HTN (hypertension) Resume home treatment.  Chronic pain syndrome Continue as needed pain medicine.  At risk for constipation Senna docusate, 1 tablet twice daily scheduled GlycoLax  17 g p.o. twice daily as needed for moderate constipation, 5 days ordered  Morbid (severe) obesity due to excess calories (HCC) BMI 37.26 with comorbidity. This complicates overall care and prognosis.        Subjective:  She has significant wheezing, short of breath getting better.  Physical Exam: Vitals:   02/25/24 0800 02/25/24 0843 02/25/24 0847 02/25/24 1113  BP: 110/69     Pulse: 78  81   Resp: 18  (!) 29   Temp:  97.9 F (36.6 C)    TempSrc:  Oral    SpO2: 98%  95% 97%  Weight:      Height:       General exam: Appears calm and comfortable  Respiratory system: Decreased breathing sounds with wheezes. Respiratory effort normal. Cardiovascular system: S1 & S2 heard, RRR. No JVD, murmurs, rubs, gallops or clicks. No pedal edema. Gastrointestinal system: Abdomen is nondistended, soft and nontender. No organomegaly or masses felt. Normal bowel sounds heard. Central nervous system: Alert and oriented. No focal neurological deficits. Extremities: Symmetric 5 x 5 power. Skin: No rashes, lesions or ulcers Psychiatry: Judgement and insight appear normal. Mood & affect appropriate.    Data Reviewed:  Reviewed chest x-ray and lab results.  Family Communication: None  Disposition: Status is: Inpatient Remains inpatient appropriate  because: Severity of disease.  IV treatment.     Time spent: 50 minutes  Author: Donaciano Frizzle, MD 02/25/2024 11:18 AM  For on call review [email or web address].

## 2024-02-26 DIAGNOSIS — D531 Other megaloblastic anemias, not elsewhere classified: Secondary | ICD-10-CM

## 2024-02-26 DIAGNOSIS — J441 Chronic obstructive pulmonary disease with (acute) exacerbation: Secondary | ICD-10-CM | POA: Diagnosis not present

## 2024-02-26 DIAGNOSIS — J9601 Acute respiratory failure with hypoxia: Secondary | ICD-10-CM | POA: Diagnosis not present

## 2024-02-26 LAB — CBC
HCT: 25.8 % — ABNORMAL LOW (ref 36.0–46.0)
Hemoglobin: 7.6 g/dL — ABNORMAL LOW (ref 12.0–15.0)
MCH: 22.8 pg — ABNORMAL LOW (ref 26.0–34.0)
MCHC: 29.5 g/dL — ABNORMAL LOW (ref 30.0–36.0)
MCV: 77.2 fL — ABNORMAL LOW (ref 80.0–100.0)
Platelets: 365 10*3/uL (ref 150–400)
RBC: 3.34 MIL/uL — ABNORMAL LOW (ref 3.87–5.11)
RDW: 17.1 % — ABNORMAL HIGH (ref 11.5–15.5)
WBC: 9 10*3/uL (ref 4.0–10.5)
nRBC: 0 % (ref 0.0–0.2)

## 2024-02-26 LAB — BASIC METABOLIC PANEL WITH GFR
Anion gap: 10 (ref 5–15)
BUN: 32 mg/dL — ABNORMAL HIGH (ref 8–23)
CO2: 25 mmol/L (ref 22–32)
Calcium: 9.1 mg/dL (ref 8.9–10.3)
Chloride: 100 mmol/L (ref 98–111)
Creatinine, Ser: 0.98 mg/dL (ref 0.44–1.00)
GFR, Estimated: >60 mL/min (ref >60)
Glucose, Bld: 131 mg/dL — ABNORMAL HIGH (ref 70–99)
Potassium: 4.1 mmol/L (ref 3.5–5.1)
Sodium: 135 mmol/L (ref 135–145)

## 2024-02-26 LAB — HEMOGLOBIN: Hemoglobin: 8.5 g/dL — ABNORMAL LOW (ref 12.0–15.0)

## 2024-02-26 LAB — MAGNESIUM: Magnesium: 2.4 mg/dL (ref 1.7–2.4)

## 2024-02-26 MED ORDER — IRON SUCROSE 300 MG IVPB - SIMPLE MED
300.0000 mg | Freq: Once | Status: AC
  Administered 2024-02-26: 300 mg via INTRAVENOUS
  Filled 2024-02-26: qty 300

## 2024-02-26 MED ORDER — SENNOSIDES-DOCUSATE SODIUM 8.6-50 MG PO TABS
2.0000 | ORAL_TABLET | Freq: Two times a day (BID) | ORAL | Status: DC
  Administered 2024-02-27 – 2024-03-02 (×8): 2 via ORAL
  Filled 2024-02-26 (×13): qty 2

## 2024-02-26 NOTE — Progress Notes (Signed)
 Progress Note   Patient: Elizabeth [name] [id number] DOB: May 13, 1961 DOA: 02/24/2024     2 DOS: the patient was seen and examined on 02/26/2024   Brief hospital course: [name] is a 63 year old female with history of hypertension, hyperlipidemia, truncal obesity, GERD, chronic low back pain without sciatica, chronic pain syndrome, who presents emergency department for chief concerns of shortness of breath. Chest x-ray showed evidence of bronchitis, BNP normal.  She had a significant respite distress with hypoxemia, was placed on BiPAP.  She is also treated with IV steroids and bronchodilator.   Principal Problem:   Acute hypoxic respiratory failure (HCC) Active Problems:   Chronic diastolic CHF (congestive heart failure) (HCC)   Anxiety   COPD exacerbation (HCC)   HLD (hyperlipidemia)   HTN (hypertension)   Constipation   Chronic pain syndrome   Chronic low back pain (Bilateral) w/o sciatica   COPD (chronic obstructive pulmonary disease) (HCC)   Complex regional pain syndrome type 1 of lower extremities (Bilateral)   History of tobacco abuse   Morbid (severe) obesity due to excess calories (HCC)   At risk for constipation   Hyponatremia   Hypokalemia   Reactive thrombocytosis   Vitamin B12 deficient megaloblastic anemia   Assessment and Plan: * Acute hypoxic respiratory failure (HCC) COPD exacerbation. Acute bronchitis. Patient initially required BiPAP, had significant bronchospasm.  Received high-dose steroids, continued with 40 mg of Solu-Medrol . She is also started on scheduled bronchodilator. Chest x-ray also showed acute bronchitis, respiratory viral panel, COVID and flu all negative. Patient still has significant wheezing, but overall short of breath is better.  Will transfer patient to regular medical floor.  But continue to monitor closely. Continue IV steroids, scheduled bronchodilator.   Hypokalemia Hyponatremia. Acute kidney injury. Condition  all improved.   Iron  deficient anemia. Vitamin B12 deficient anemia. Hemoglobin dropped down to 7.6, patient did not have any rectal bleeding or black stool, she has no bowel movement since admission. Significant B12 deficiency 107, start B12 injection. Will also give IV iron .  Will monitor hemoglobin and transfuse as needed.   Anxiety Continue to follow.   HLD (hyperlipidemia) Home atorvastatin  40 mg nightly resumed   HTN (hypertension) Resume home treatment.   Chronic pain syndrome Continue as needed pain medicine.     Morbid (severe) obesity due to excess calories (HCC) BMI 37.26 with comorbidity. This complicates overall care and prognosis.             Subjective:  Patient feels better today, but still has significant wheezing.  Physical Exam: Vitals:   02/26/24 0800 02/26/24 0818 02/26/24 0900 02/26/24 1000  BP: 112/65  (!) 118/59 (!) 103/54  Pulse: 70  75 66  Resp: 17  (!) 22 19  Temp:  97.7 F (36.5 C)    TempSrc:  Oral    SpO2: 96%  97% 97%  Weight:      Height:       General exam: Appears calm and comfortable  Respiratory system: Decreased breathing sounds with wheezes. Respiratory effort normal. Cardiovascular system: S1 & S2 heard, RRR. No JVD, murmurs, rubs, gallops or clicks. No pedal edema. Gastrointestinal system: Abdomen is nondistended, soft and nontender. No organomegaly or masses felt. Normal bowel sounds heard. Central nervous system: Alert and oriented. No focal neurological deficits. Extremities: Symmetric 5 x 5 power. Skin: No rashes, lesions or ulcers Psychiatry: Judgement and insight appear normal. Mood & affect appropriate. \  Data Reviewed:  Lab results reviewed.  Family Communication:  None  Disposition: Status is: Inpatient Remains inpatient appropriate because: Severity of disease, IV treatment.     Time spent: 50 minutes  Author: Donaciano Frizzle, MD 02/26/2024 11:08 AM  For on call review [email or web address].

## 2024-02-26 NOTE — Progress Notes (Addendum)
 Report called to nurse Dennise Fitz for room 221.

## 2024-02-26 NOTE — Progress Notes (Signed)
 Pt transported to room 221 via WC accompanied by this RN and NT, on telemetry and O2 at 2L. Handoff completed at bedside with RN Alexa Andrews.

## 2024-02-27 DIAGNOSIS — D531 Other megaloblastic anemias, not elsewhere classified: Secondary | ICD-10-CM | POA: Diagnosis not present

## 2024-02-27 DIAGNOSIS — J9601 Acute respiratory failure with hypoxia: Secondary | ICD-10-CM | POA: Diagnosis not present

## 2024-02-27 DIAGNOSIS — J441 Chronic obstructive pulmonary disease with (acute) exacerbation: Secondary | ICD-10-CM | POA: Diagnosis not present

## 2024-02-27 LAB — COMPREHENSIVE METABOLIC PANEL WITH GFR
ALT: 16 U/L (ref 0–44)
AST: 17 U/L (ref 15–41)
Albumin: 3.5 g/dL (ref 3.5–5.0)
Alkaline Phosphatase: 80 U/L (ref 38–126)
Anion gap: 9 (ref 5–15)
BUN: 31 mg/dL — ABNORMAL HIGH (ref 8–23)
CO2: 25 mmol/L (ref 22–32)
Calcium: 8.8 mg/dL — ABNORMAL LOW (ref 8.9–10.3)
Chloride: 101 mmol/L (ref 98–111)
Creatinine, Ser: 0.93 mg/dL (ref 0.44–1.00)
GFR, Estimated: >60 mL/min (ref >60)
Glucose, Bld: 110 mg/dL — ABNORMAL HIGH (ref 70–99)
Potassium: 3.9 mmol/L (ref 3.5–5.1)
Sodium: 135 mmol/L (ref 135–145)
Total Bilirubin: 0.6 mg/dL (ref 0.0–1.2)
Total Protein: 6.6 g/dL (ref 6.5–8.1)

## 2024-02-27 LAB — CBC
HCT: 26.4 % — ABNORMAL LOW (ref 36.0–46.0)
Hemoglobin: 7.9 g/dL — ABNORMAL LOW (ref 12.0–15.0)
MCH: 23.2 pg — ABNORMAL LOW (ref 26.0–34.0)
MCHC: 29.9 g/dL — ABNORMAL LOW (ref 30.0–36.0)
MCV: 77.4 fL — ABNORMAL LOW (ref 80.0–100.0)
Platelets: 385 K/uL (ref 150–400)
RBC: 3.41 MIL/uL — ABNORMAL LOW (ref 3.87–5.11)
RDW: 17.2 % — ABNORMAL HIGH (ref 11.5–15.5)
WBC: 10.4 K/uL (ref 4.0–10.5)
nRBC: 0 % (ref 0.0–0.2)

## 2024-02-27 LAB — MAGNESIUM: Magnesium: 2.6 mg/dL — ABNORMAL HIGH (ref 1.7–2.4)

## 2024-02-27 MED ORDER — LORAZEPAM 1 MG PO TABS
1.0000 mg | ORAL_TABLET | Freq: Four times a day (QID) | ORAL | Status: DC | PRN
  Administered 2024-02-27 – 2024-03-03 (×14): 1 mg via ORAL
  Filled 2024-02-27 (×14): qty 1

## 2024-02-27 MED ORDER — LACTULOSE 10 GM/15ML PO SOLN
20.0000 g | Freq: Once | ORAL | Status: DC
  Filled 2024-02-27: qty 30

## 2024-02-27 MED ORDER — IPRATROPIUM-ALBUTEROL 0.5-2.5 (3) MG/3ML IN SOLN
3.0000 mL | Freq: Four times a day (QID) | RESPIRATORY_TRACT | Status: DC
  Administered 2024-02-27 – 2024-02-28 (×5): 3 mL via RESPIRATORY_TRACT
  Filled 2024-02-27 (×5): qty 3

## 2024-02-27 MED ORDER — LORAZEPAM 0.5 MG PO TABS
0.5000 mg | ORAL_TABLET | Freq: Four times a day (QID) | ORAL | Status: DC | PRN
  Administered 2024-02-27: 0.5 mg via ORAL
  Filled 2024-02-27: qty 1

## 2024-02-27 NOTE — Progress Notes (Signed)
 Progress Note   Patient: Elizabeth [name] [id number] DOB: 1960-11-26 DOA: 02/24/2024     3 DOS: the patient was seen and examined on 02/27/2024   Brief hospital course: [name] is a 63 year old female with history of hypertension, hyperlipidemia, truncal obesity, GERD, chronic low back pain without sciatica, chronic pain syndrome, who presents emergency department for chief concerns of shortness of breath. Chest x-ray showed evidence of bronchitis, BNP normal.  She had a significant respite distress with hypoxemia, was placed on BiPAP.  She is also treated with IV steroids and bronchodilator.   Principal Problem:   Acute hypoxic respiratory failure (HCC) Active Problems:   Chronic diastolic CHF (congestive heart failure) (HCC)   Anxiety   COPD exacerbation (HCC)   HLD (hyperlipidemia)   HTN (hypertension)   Constipation   Chronic pain syndrome   Chronic low back pain (Bilateral) w/o sciatica   COPD (chronic obstructive pulmonary disease) (HCC)   Complex regional pain syndrome type 1 of lower extremities (Bilateral)   History of tobacco abuse   Morbid (severe) obesity due to excess calories (HCC)   At risk for constipation   Hyponatremia   Hypokalemia   Reactive thrombocytosis   Vitamin B12 deficient megaloblastic anemia   Assessment and Plan:   Acute hypoxic respiratory failure (HCC) COPD exacerbation. Acute bronchitis. Patient initially required BiPAP, had significant bronchospasm.  Received high-dose steroids, continued with 40 mg of Solu-Medrol . She is also started on scheduled bronchodilator. Chest x-ray also showed acute bronchitis, respiratory viral panel, COVID and flu all negative. Patient feels better, but still required 2 L oxygen, still has signal wheezing.  Short of breath seem to be better. Continue IV steroids once a day, continue scheduled bronchodilator.   Hypokalemia Hyponatremia. Acute kidney injury. Condition all improved.   Iron   deficient anemia. Vitamin B12 deficient anemia. Hemoglobin dropped down to 7.6, patient did not have any rectal bleeding or black stool, she has no bowel movement since admission. Significant B12 deficiency 107, start B12 injection. Patient received IV iron  and 4 doses of injected B12. Hemoglobin today 7.9, patient has not had a bowel movement since admission, no evidence of GI bleed.  Continue to follow.   Anxiety Continue to follow.   HLD (hyperlipidemia) Home atorvastatin  40 mg nightly resumed   HTN (hypertension) Resume home treatment.   Chronic pain syndrome Continue as needed pain medicine.     Morbid (severe) obesity due to excess calories (HCC) BMI 37.26 with comorbidity. This complicates overall care and prognosis.      Subjective:  Still significant short of breath with exertion, but improving.  On 2 L oxygen with wheezing.  Physical Exam: Vitals:   02/26/24 2030 02/27/24 0702 02/27/24 0844 02/27/24 1314  BP: 112/60  117/70   Pulse: 78  74   Resp: 20  18   Temp: 97.7 F (36.5 C)  97.9 F (36.6 C)   TempSrc: Oral  Oral   SpO2: 96% 93% 96% 96%  Weight:      Height:       General exam: Appears calm and comfortable  Respiratory system: Decreased breathing sounds with wheezes respiratory effort normal. Cardiovascular system: S1 & S2 heard, RRR. No JVD, murmurs, rubs, gallops or clicks. No pedal edema. Gastrointestinal system: Abdomen is nondistended, soft and nontender. No organomegaly or masses felt. Normal bowel sounds heard. Central nervous system: Alert and oriented. No focal neurological deficits. Extremities: Symmetric 5 x 5 power. Skin: No rashes, lesions or ulcers Psychiatry: Judgement and  insight appear normal. Mood & affect appropriate.    Data Reviewed:  Lab results reviewed.  Family Communication: None  Disposition: Status is: Inpatient Remains inpatient appropriate because: Severity of disease, IV treatment.     Time spent: 35  minutes  Author: Donaciano Frizzle, MD 02/27/2024 1:26 PM  For on call review [email or web address].

## 2024-02-27 NOTE — Plan of Care (Signed)
 Patient is refusing lactulose , gave PRN miralax .  Patient states her regular bowel pattern is once a week

## 2024-02-28 DIAGNOSIS — D531 Other megaloblastic anemias, not elsewhere classified: Secondary | ICD-10-CM | POA: Diagnosis not present

## 2024-02-28 DIAGNOSIS — J9601 Acute respiratory failure with hypoxia: Secondary | ICD-10-CM | POA: Diagnosis not present

## 2024-02-28 DIAGNOSIS — J441 Chronic obstructive pulmonary disease with (acute) exacerbation: Secondary | ICD-10-CM | POA: Diagnosis not present

## 2024-02-28 LAB — CBC
HCT: 27.8 % — ABNORMAL LOW (ref 36.0–46.0)
Hemoglobin: 8.2 g/dL — ABNORMAL LOW (ref 12.0–15.0)
MCH: 23 pg — ABNORMAL LOW (ref 26.0–34.0)
MCHC: 29.5 g/dL — ABNORMAL LOW (ref 30.0–36.0)
MCV: 77.9 fL — ABNORMAL LOW (ref 80.0–100.0)
Platelets: 386 10*3/uL (ref 150–400)
RBC: 3.57 MIL/uL — ABNORMAL LOW (ref 3.87–5.11)
RDW: 17.4 % — ABNORMAL HIGH (ref 11.5–15.5)
WBC: 12.3 10*3/uL — ABNORMAL HIGH (ref 4.0–10.5)
nRBC: 0.3 % — ABNORMAL HIGH (ref 0.0–0.2)

## 2024-02-28 MED ORDER — IPRATROPIUM-ALBUTEROL 0.5-2.5 (3) MG/3ML IN SOLN
3.0000 mL | RESPIRATORY_TRACT | Status: DC
  Administered 2024-02-28 – 2024-03-01 (×10): 3 mL via RESPIRATORY_TRACT
  Filled 2024-02-28 (×10): qty 3

## 2024-02-28 MED ORDER — IPRATROPIUM-ALBUTEROL 0.5-2.5 (3) MG/3ML IN SOLN: 3.0000 mL | Freq: Four times a day (QID) | RESPIRATORY_TRACT | Status: DC

## 2024-02-28 MED ORDER — VITAMIN B-12 1000 MCG PO TABS
1000.0000 ug | ORAL_TABLET | Freq: Every day | ORAL | Status: DC
  Administered 2024-02-29 – 2024-03-03 (×4): 1000 ug via ORAL
  Filled 2024-02-28 (×4): qty 1

## 2024-02-28 NOTE — Progress Notes (Signed)
 Progress Note   Patient: Elizabeth [name] [id number] DOB: 09/01/1961 DOA: 02/24/2024     4 DOS: the patient was seen and examined on 02/28/2024   Brief hospital course: [name] is a 63 year old female with history of hypertension, hyperlipidemia, truncal obesity, GERD, chronic low back pain without sciatica, chronic pain syndrome, who presents emergency department for chief concerns of shortness of breath. Chest x-ray showed evidence of bronchitis, BNP normal.  She had a significant respite distress with hypoxemia, was placed on BiPAP.  She is also treated with IV steroids and bronchodilator.   Principal Problem:   Acute hypoxic respiratory failure (HCC) Active Problems:   Chronic diastolic CHF (congestive heart failure) (HCC)   Anxiety   COPD exacerbation (HCC)   HLD (hyperlipidemia)   HTN (hypertension)   Constipation   Chronic pain syndrome   Chronic low back pain (Bilateral) w/o sciatica   COPD (chronic obstructive pulmonary disease) (HCC)   Complex regional pain syndrome type 1 of lower extremities (Bilateral)   History of tobacco abuse   Morbid (severe) obesity due to excess calories (HCC)   At risk for constipation   Hyponatremia   Hypokalemia   Reactive thrombocytosis   Vitamin B12 deficient megaloblastic anemia   Assessment and Plan: Acute hypoxic respiratory failure (HCC) COPD exacerbation. Acute bronchitis. Patient initially required BiPAP, had significant bronchospasm.  Received high-dose steroids, continued with 40 mg of Solu-Medrol . She is also started on scheduled bronchodilator. Chest x-ray also showed acute bronchitis, respiratory viral panel, COVID and flu all negative. Patient continued to have significant bronchospasm, has increased mucus production.  Will increase DuoNeb to every 4 hours, continue IV Solu-Medrol .  Added incentive spirometer and flutter valve   Hypokalemia Hyponatremia. Acute kidney injury. Condition all improved.    Iron  deficient anemia. Vitamin B12 deficient anemia. Hemoglobin dropped down to 7.6, patient did not have any rectal bleeding or black stool, she has no bowel movement since admission. Significant B12 deficiency 107, start B12 injection. Patient received IV iron  and 4 doses of injected B12.  Changed to oral B12. Hemoglobin today 7.9, patient has not had a bowel movement since admission, no evidence of GI bleed.     Anxiety Continue to follow.   HLD (hyperlipidemia) Home atorvastatin  40 mg nightly resumed   HTN (hypertension) Resume home treatment.   Chronic pain syndrome Continue as needed pain medicine.     Morbid (severe) obesity due to excess calories (HCC) BMI 37.26 with comorbidity. This complicates overall care and prognosis.         Subjective:  Patient still have significant wheezing, short of breath with duration, has increased mucus production.  Physical Exam: Vitals:   02/28/24 0412 02/28/24 0730 02/28/24 0741 02/28/24 1433  BP: 115/67  130/73 (!) 104/94  Pulse: 71  71 82  Resp: 18  (!) 22 19  Temp: 97.6 F (36.4 C)  97.6 F (36.4 C) 98.1 F (36.7 C)  TempSrc: Oral  Oral Oral  SpO2: 97% 97% 100% 97%  Weight:      Height:       General exam: Appears calm and comfortable  Respiratory system: Diffuse wheezes. Respiratory effort normal. Cardiovascular system: S1 & S2 heard, RRR. No JVD, murmurs, rubs, gallops or clicks. No pedal edema. Gastrointestinal system: Abdomen is nondistended, soft and nontender. No organomegaly or masses felt. Normal bowel sounds heard. Central nervous system: Alert and oriented. No focal neurological deficits. Extremities: Symmetric 5 x 5 power. Skin: No rashes, lesions or ulcers Psychiatry:  Judgement and insight appear normal. Mood & affect appropriate.    Data Reviewed:  Lab results reviewed.  Family Communication: None  Disposition: Status is: Inpatient Remains inpatient appropriate because: Severity of disease,  IV treatment.     Time spent: 35 minutes  Author: Donaciano Frizzle, MD 02/28/2024 3:24 PM  For on call review [email or web address].

## 2024-02-28 NOTE — TOC Initial Note (Signed)
 Transition of Care [facility]) - Initial/Assessment Note    Patient Details  Name: Elizabeth [name] MRN: [id number] Date of Birth: 1961-05-07  Transition of Care [facility]) CM/SW Contact:    Odilia Bennett, LCSW Phone Number: 02/28/2024, 11:41 AM  Clinical Narrative:  Readmission prevention screen complete. CSW met with patient. No family at bedside. CSW introduced role and explained that discharge planning would be discussed. PCP is Hedwig Livers, FNP. She drives herself to her appointments. Pharmacy is [facility]. No issues obtaining medications. Patient lives home with her son. No home health or DME use to get around prior to admission. Patient stated she uses 2-4 L of chronic oxygen at home but could not remember the name of the agency. Per chart review, she was set up with [employer] in 2023. [employer] liaison confirmed her oxygen is from them. She has a 5 L concentrator and NIV from them. No further concerns. CSW will continue to follow patient for support and facilitate return home once stable. Son will transport her home at discharge and bring her oxygen for the ride. Patient stated it is always in the car.                 Expected Discharge Plan: Home/Self Care Barriers to Discharge: Continued Medical Work up   Patient Goals and CMS Choice            Expected Discharge Plan and Services     Post Acute Care Choice: NA Living arrangements for the past 2 months: Single Family Home                                      Prior Living Arrangements/Services Living arrangements for the past 2 months: Single Family Home Lives with:: Adult Children Patient language and need for interpreter reviewed:: Yes Do you feel safe going back to the place where you live?: Yes      Need for Family Participation in Patient Care: Yes (Comment) Care giver support system in place?: Yes (comment) Current home services: DME Criminal Activity/Legal Involvement Pertinent to Current  Situation/Hospitalization: No - Comment as needed  Activities of Daily Living   ADL Screening (condition at time of admission) Independently performs ADLs?: Yes (appropriate for developmental age) Is the patient deaf or have difficulty hearing?: No Does the patient have difficulty seeing, even when wearing glasses/contacts?: No Does the patient have difficulty concentrating, remembering, or making decisions?: No  Permission Sought/Granted                  Emotional Assessment Appearance:: Appears stated age Attitude/Demeanor/Rapport: Engaged Affect (typically observed): Accepting, Calm Orientation: : Oriented to Self, Oriented to Place, Oriented to  Time, Oriented to Situation Alcohol / Substance Use: Not Applicable Psych Involvement: No (comment)  Admission diagnosis:  Acute hypoxic respiratory failure (HCC) [J96.01] Patient Active Problem List   Diagnosis Date Noted   Hyponatremia 02/25/2024   Hypokalemia 02/25/2024   Reactive thrombocytosis 02/25/2024   Vitamin B12 deficient megaloblastic anemia 02/25/2024   Acute hypoxic respiratory failure (HCC) 02/24/2024   Morbid (severe) obesity due to excess calories (HCC) 02/24/2024   At risk for constipation 02/24/2024   COPD exacerbation (HCC) 07/29/2022   HLD (hyperlipidemia) 07/29/2022   Constipation 07/29/2022   Bilateral leg edema 07/29/2022   Anxiety 05/20/2022   Chronic, continuous use of opioids 05/15/2022   Elevated serum creatinine 05/14/2022   Chronic anemia 05/14/2022  COPD with acute exacerbation (HCC) 04/17/2021   Chronic use of opiate for therapeutic purpose 02/19/2021   History of tobacco abuse 01/03/2021   Complex regional pain syndrome type 1 of lower extremities (Bilateral) 11/20/2020   Complex regional pain syndrome type 1 of upper extremities (Bilateral) 05/16/2019   Pharmacologic therapy 05/16/2019   Disorder of skeletal system 05/16/2019   Problems influencing health status 05/16/2019   Palliative  care by specialist 04/07/2019   Acute respiratory failure with hypoxia (HCC) 04/07/2019   HTN (hypertension) 08/24/2018   COPD (chronic obstructive pulmonary disease) (HCC) 08/24/2018   Lumbar spondylosis 08/22/2018   Disorder of bone, unspecified 08/30/2017   Opioid-induced constipation (OIC) 09/15/2016   Lumbar facet syndrome (Bilateral) 03/18/2016   Insomnia secondary to chronic pain 12/16/2015   Chronic lower extremity pain (1ry area of Pain) (Bilateral) 09/23/2015   Long term prescription opiate use 09/23/2015   Goals of care, counseling/discussion 09/23/2015   Elevated sedimentation rate 09/23/2015   Chronic low back pain (Bilateral) w/o sciatica 07/25/2015   Generalized anxiety disorder 07/25/2015   Encounter for therapeutic drug level monitoring 07/25/2015   Long term current use of opiate analgesic 07/25/2015   Uncomplicated opioid dependence (HCC) 07/25/2015   Opiate use (30 MME/Day) 07/25/2015   Chronic pain syndrome 07/25/2015   Opiate analgesic use agreement exists 07/25/2015   Presence of spinal cord stimulator 07/25/2015   Chronic diastolic CHF (congestive heart failure) (HCC) 07/25/2015   Vitamin D  deficiency 07/25/2015   Vitamin B12 deficiency 07/25/2015   PCP:  Evelena Hines, FNP (Inactive) Pharmacy:   [facility] - [place], [place] - [address] [place] [address] Phone: [phone] Fax: [phone]  [facility] [address] [place] [address] Phone: [phone] Fax: [phone]     Social Drivers of Health (SDOH) Social History: SDOH Screenings   Food Insecurity: No Food Insecurity (02/24/2024)  Housing: Low Risk  (02/24/2024)  Transportation Needs: No Transportation Needs (02/24/2024)  Utilities: Not At Risk (02/24/2024)  Depression (PHQ2-9): Low Risk  (12/13/2023)  Financial Resource Strain: Low Risk  (02/24/2024)   Received from [facility]  Physical  Activity: Unknown (08/14/2018)  Social Connections: Unknown (08/14/2018)  Stress: Unknown (08/14/2018)  Tobacco Use: Medium Risk (02/24/2024)   SDOH Interventions:     Readmission Risk Interventions    02/28/2024   11:39 AM 07/31/2022    3:39 PM 05/16/2022   11:11 AM  Readmission Risk Prevention Plan  Transportation Screening Complete Complete Complete  PCP or Specialist Appt within 3-5 Days Complete Complete Complete  HRI or Home Care Consult  Complete   Social Work Consult for Recovery Care Planning/Counseling Complete Complete Complete  Palliative Care Screening Not Applicable Not Applicable Not Applicable  Medication Review Oceanographer) Complete Complete Complete

## 2024-02-28 NOTE — Plan of Care (Signed)

## 2024-02-28 NOTE — Care Management Important Message (Signed)
 Important Message  Patient Details  Name: Elizabeth [name] MRN: [id number] Date of Birth: [birth year]   Important Message Given:  Yes - Medicare IM     Anise Kerns 02/28/2024, 12:50 PM

## 2024-02-29 DIAGNOSIS — J441 Chronic obstructive pulmonary disease with (acute) exacerbation: Secondary | ICD-10-CM | POA: Diagnosis not present

## 2024-02-29 DIAGNOSIS — I5032 Chronic diastolic (congestive) heart failure: Secondary | ICD-10-CM | POA: Diagnosis not present

## 2024-02-29 DIAGNOSIS — J9601 Acute respiratory failure with hypoxia: Secondary | ICD-10-CM | POA: Diagnosis not present

## 2024-02-29 MED ORDER — POLYSACCHARIDE IRON COMPLEX 150 MG PO CAPS
150.0000 mg | ORAL_CAPSULE | Freq: Every day | ORAL | Status: DC
  Administered 2024-02-29 – 2024-03-03 (×4): 150 mg via ORAL
  Filled 2024-02-29 (×4): qty 1

## 2024-02-29 MED ORDER — LACTULOSE 10 GM/15ML PO SOLN
20.0000 g | Freq: Once | ORAL | Status: AC
Start: 2024-02-29 — End: 2024-02-29
  Administered 2024-02-29: 20 g via ORAL
  Filled 2024-02-29: qty 30

## 2024-02-29 NOTE — Progress Notes (Signed)
 Progress Note   Patient: Elizabeth [name] ZOX:[id number] DOB: 01-Jan-1961 DOA: 02/24/2024     5 DOS: the patient was seen and examined on 02/29/2024   Brief hospital course: Ms. Raidyn Breiner is a 63 year old female with history of hypertension, hyperlipidemia, truncal obesity, GERD, chronic low back pain without sciatica, chronic pain syndrome, who presents emergency department for chief concerns of shortness of breath. Chest x-ray showed evidence of bronchitis, BNP normal.  She had a significant respite distress with hypoxemia, was placed on BiPAP.  She is also treated with IV steroids and bronchodilator.   Principal Problem:   Acute hypoxic respiratory failure (HCC) Active Problems:   Chronic diastolic CHF (congestive heart failure) (HCC)   Anxiety   COPD exacerbation (HCC)   HLD (hyperlipidemia)   HTN (hypertension)   Constipation   Chronic pain syndrome   Chronic low back pain (Bilateral) w/o sciatica   COPD (chronic obstructive pulmonary disease) (HCC)   Complex regional pain syndrome type 1 of lower extremities (Bilateral)   History of tobacco abuse   Morbid (severe) obesity due to excess calories (HCC)   At risk for constipation   Hyponatremia   Hypokalemia   Reactive thrombocytosis   Vitamin B12 deficient megaloblastic anemia   Assessment and Plan: Acute hypoxic respiratory failure (HCC) COPD exacerbation. Acute bronchitis. Patient initially required BiPAP, had significant bronchospasm.  Received high-dose steroids, continued with 40 mg of Solu-Medrol . She is also started on scheduled bronchodilator. Chest x-ray also showed acute bronchitis, respiratory viral panel, COVID and flu all negative. Patient continued to have significant bronchospasm, has increased mucus production. Increase DuoNeb to every 4 hours 5/19, continue IV Solu-Medrol .  Added incentive spirometer and flutter valve. Patient is slow to recover, short of breath is better.  However still has  significant wheezing.  May take additional 2 days before discharge.   Hypokalemia Hyponatremia. Acute kidney injury. Condition all improved.   Iron  deficient anemia. Vitamin B12 deficient anemia. Hemoglobin dropped down to 7.6, patient did not have any rectal bleeding or black stool, she has no bowel movement since admission. Significant B12 deficiency 107, start B12 injection. Patient received IV iron  and 4 doses of injected B12.  Changed to oral B12 and oral iron . No evidence of GI bleed.   Anxiety Continue to follow.   HLD (hyperlipidemia) Home atorvastatin  40 mg nightly resumed   HTN (hypertension) Resume home treatment.   Chronic pain syndrome Continue as needed pain medicine.     Morbid (severe) obesity due to excess calories (HCC) BMI 37.26 with comorbidity. This complicates overall care and prognosis.           Subjective:  Shortness of breath is improving, still wheezing.  Physical Exam: Vitals:   02/28/24 1928 02/29/24 0315 02/29/24 0348 02/29/24 0731  BP: 112/69  126/71 (!) 146/85  Pulse: 92  78 79  Resp: 20  20 (!) 21  Temp: 98 F (36.7 C)  97.8 F (36.6 C) 97.6 F (36.4 C)  TempSrc:    Oral  SpO2: 96% 97% 97% 100%  Weight:      Height:       General exam: Appears calm and comfortable  Respiratory system: Decreased breathing sounds with wheezes. Respiratory effort normal. Cardiovascular system: S1 & S2 heard, RRR. No JVD, murmurs, rubs, gallops or clicks. No pedal edema. Gastrointestinal system: Abdomen is nondistended, soft and nontender. No organomegaly or masses felt. Normal bowel sounds heard. Central nervous system: Alert and oriented. No focal neurological deficits. Extremities: Symmetric 5  x 5 power. Skin: No rashes, lesions or ulcers Psychiatry: Judgement and insight appear normal. Mood & affect appropriate.    Data Reviewed:  Lab results reviewed.  Family Communication: None  Disposition: Status is: Inpatient Remains  inpatient appropriate because: Severity of disease, IV treatment.     Time spent: 35 minutes  Author: Donaciano Frizzle, MD 02/29/2024 3:53 PM  For on call review [email or web address].

## 2024-03-01 DIAGNOSIS — J9601 Acute respiratory failure with hypoxia: Secondary | ICD-10-CM | POA: Diagnosis not present

## 2024-03-01 LAB — CBC
HCT: 28 % — ABNORMAL LOW (ref 36.0–46.0)
Hemoglobin: 8.2 g/dL — ABNORMAL LOW (ref 12.0–15.0)
MCH: 23.4 pg — ABNORMAL LOW (ref 26.0–34.0)
MCHC: 29.3 g/dL — ABNORMAL LOW (ref 30.0–36.0)
MCV: 80 fL (ref 80.0–100.0)
Platelets: 372 10*3/uL (ref 150–400)
RBC: 3.5 MIL/uL — ABNORMAL LOW (ref 3.87–5.11)
RDW: 17.7 % — ABNORMAL HIGH (ref 11.5–15.5)
WBC: 12.9 10*3/uL — ABNORMAL HIGH (ref 4.0–10.5)
nRBC: 0.2 % (ref 0.0–0.2)

## 2024-03-01 LAB — D-DIMER, QUANTITATIVE: D-Dimer, Quant: <0.27 ug{FEU}/mL (ref 0.00–0.50)

## 2024-03-01 MED ORDER — TORSEMIDE 20 MG PO TABS
40.0000 mg | ORAL_TABLET | Freq: Every day | ORAL | Status: DC
  Administered 2024-03-01 – 2024-03-03 (×3): 40 mg via ORAL
  Filled 2024-03-01 (×3): qty 2

## 2024-03-01 MED ORDER — BUSPIRONE HCL 10 MG PO TABS
5.0000 mg | ORAL_TABLET | Freq: Two times a day (BID) | ORAL | Status: DC | PRN
  Administered 2024-03-01: 5 mg via ORAL
  Filled 2024-03-01: qty 1

## 2024-03-01 MED ORDER — METHYLPREDNISOLONE SODIUM SUCC 125 MG IJ SOLR
60.0000 mg | Freq: Two times a day (BID) | INTRAMUSCULAR | Status: DC
  Administered 2024-03-01 – 2024-03-03 (×4): 60 mg via INTRAVENOUS
  Filled 2024-03-01 (×4): qty 2

## 2024-03-01 MED ORDER — IPRATROPIUM-ALBUTEROL 0.5-2.5 (3) MG/3ML IN SOLN
3.0000 mL | Freq: Four times a day (QID) | RESPIRATORY_TRACT | Status: DC
  Administered 2024-03-01 – 2024-03-03 (×7): 3 mL via RESPIRATORY_TRACT
  Filled 2024-03-01 (×4): qty 3
  Filled 2024-03-01: qty 36
  Filled 2024-03-01 (×2): qty 3

## 2024-03-01 MED ORDER — ALBUTEROL SULFATE (2.5 MG/3ML) 0.083% IN NEBU
2.5000 mg | INHALATION_SOLUTION | RESPIRATORY_TRACT | Status: DC | PRN
  Administered 2024-03-01: 2.5 mg via RESPIRATORY_TRACT
  Filled 2024-03-01: qty 3

## 2024-03-01 MED ORDER — METHYLPREDNISOLONE SODIUM SUCC 125 MG IJ SOLR: 80.0000 mg | Freq: Two times a day (BID) | INTRAMUSCULAR | Status: DC

## 2024-03-01 MED ORDER — SPIRONOLACTONE 25 MG PO TABS
25.0000 mg | ORAL_TABLET | Freq: Every day | ORAL | Status: DC
  Administered 2024-03-01 – 2024-03-03 (×3): 25 mg via ORAL
  Filled 2024-03-01 (×3): qty 1

## 2024-03-01 NOTE — Consult Note (Signed)
 PULMONOLOGY         Date: 03/01/2024,   MRN# [id number] Elizabeth [name] 10/05/1961     AdmissionWeight: 72.6 kg                 CurrentWeight: 78.1 kg  Referring provider: Dr Murrel Arnt   CHIEF COMPLAINT:   Acute on chronic hypoxemic respiratory failure   HISTORY OF PRESENT ILLNESS   IMs. Elizabeth [name] is a 63 year old female with history of hypertension, hyperlipidemia, truncal obesity, GERD, chronic low back pain without sciatica, chronic LE edema with pulmonary edema who presents emergency department for chief concerns of shortness of breath and hypoxemia.  She was seen by in pulmonary clinic and noted to have increased abdominal swelling, worsening LE edema and worsening dyspnea. She received aggressive diuresis on outpatient but despite this continued to have worsening anasarca. Her bloodwork showed preserved renal function and she did not have clinical signs of heart failure on examination. She was placed on BIPAP therapy in ER and has improved but continues to have significant dyspnea and hypoxemia. PCCM consultation for further evaluation and treatment.    PAST MEDICAL HISTORY   Past Medical History:  Diagnosis Date   Anxiety    Arm pain 07/25/2015   CHF (congestive heart failure) (HCC)    Congestive heart failure (HCC) 07/25/2015   COPD (chronic obstructive pulmonary disease) (HCC)    never been idagnosed   Depression    Hypertension    Lower extremity pain 07/25/2015   Reflux    RSD (reflex sympathetic dystrophy)    Spinal cord stimulator status    Vitamin B12 deficiency    Vitamin D  deficiency      SURGICAL HISTORY   Past Surgical History:  Procedure Laterality Date   BREAST BIOPSY Right 05/10/2023   us  bx/ venus clip/ path pending   BREAST BIOPSY Right 05/10/2023   US  RT BREAST BX W LOC DEV 1ST LESION IMG BX SPEC US  GUIDE 05/10/2023 [facility]   SPINAL CORD STIMULATOR IMPLANT     x 2     FAMILY HISTORY   Family History  Problem  Relation Age of Onset   Cirrhosis Mother    Cancer Father      SOCIAL HISTORY   Social History   Tobacco Use   Smoking status: Former    Current packs/day: 0.00    Average packs/day: 1.5 packs/day for 40.0 years (60.0 ttl pk-yrs)    Types: Cigarettes    Start date: 04/11/1978    Quit date: 04/11/2018    Years since quitting: 5.8   Smokeless tobacco: Never  Vaping Use   Vaping status: Never Used  Substance Use Topics   Alcohol use: No    Alcohol/week: 0.0 standard drinks of alcohol   Drug use: No     MEDICATIONS    Home Medication:    Current Medication:  Current Facility-Administered Medications:    albuterol  (PROVENTIL ) (2.5 MG/3ML) 0.083% nebulizer solution 2.5 mg, 2.5 mg, Nebulization, Q2H PRN, Lorita Rosa, MD, 2.5 mg at 03/01/24 1625   atorvastatin  (LIPITOR) tablet 40 mg, 40 mg, Oral, QHS, Cox, Amy N, DO, 40 mg at 02/29/24 2041   busPIRone (BUSPAR) tablet 5 mg, 5 mg, Oral, BID PRN, Lorita Rosa, MD, 5 mg at 03/01/24 1643   carvedilol  (COREG ) tablet 3.125 mg, 3.125 mg, Oral, BID WC, Cox, Amy N, DO, 3.125 mg at 03/01/24 1638   cyanocobalamin  (VITAMIN B12) tablet 1,000 mcg, 1,000 mcg, Oral, Daily, Donaciano Frizzle, MD, 1,000  mcg at 03/01/24 2956   fluticasone  furoate-vilanterol (BREO ELLIPTA ) 100-25 MCG/ACT 1 puff, 1 puff, Inhalation, Daily, Cox, Amy N, DO, 1 puff at 03/01/24 2130   heparin  injection 5,000 Units, 5,000 Units, Subcutaneous, Q8H, Cox, Amy N, DO, 5,000 Units at 03/01/24 1638   ipratropium-albuterol  (DUONEB) 0.5-2.5 (3) MG/3ML nebulizer solution 3 mL, 3 mL, Nebulization, Q6H, Lorita Rosa, MD   iron  polysaccharides (NIFEREX) capsule 150 mg, 150 mg, Oral, Daily, Zhang, Dekui, MD, 150 mg at 03/01/24 0825   LORazepam  (ATIVAN ) tablet 1 mg, 1 mg, Oral, Q6H PRN, Zhang, Dekui, MD, 1 mg at 03/01/24 1241   methylPREDNISolone  sodium succinate (SOLU-MEDROL ) 125 mg/2 mL injection 80 mg, 80 mg, Intravenous, Q12H, Lorita Rosa, MD   nicotine  (NICODERM CQ  - dosed in  mg/24 hours) patch 14 mg, 14 mg, Transdermal, Daily PRN, Cox, Amy N, DO   Oral care mouth rinse, 15 mL, Mouth Rinse, PRN, Donaciano Frizzle, MD   oxyCODONE -acetaminophen  (PERCOCET/ROXICET) 5-325 MG per tablet 1 tablet, 1 tablet, Oral, Q4H PRN, Donaciano Frizzle, MD, 1 tablet at 03/01/24 1643   pantoprazole  (PROTONIX ) EC tablet 40 mg, 40 mg, Oral, Daily, Cox, Amy N, DO, 40 mg at 03/01/24 0825   senna-docusate (Senokot-S) tablet 2 tablet, 2 tablet, Oral, BID, Donaciano Frizzle, MD, 2 tablet at 03/01/24 8657   spironolactone  (ALDACTONE ) tablet 25 mg, 25 mg, Oral, Daily, Lorita Rosa, MD, 25 mg at 03/01/24 1638   torsemide  (DEMADEX ) tablet 40 mg, 40 mg, Oral, Daily, Lorita Rosa, MD, 40 mg at 03/01/24 1638    ALLERGIES   Lisinopril     REVIEW OF SYSTEMS    Review of Systems:  Gen:  Denies  fever, sweats, chills weigh loss  HEENT: Denies blurred vision, double vision, ear pain, eye pain, hearing loss, nose bleeds, sore throat Cardiac:  No dizziness, chest pain or heaviness, chest tightness,edema Resp:   reports dyspnea chronically  Gi: Denies swallowing difficulty, stomach pain, nausea or vomiting, diarrhea, constipation, bowel incontinence Gu:  Denies bladder incontinence, burning urine Ext:   Denies Joint pain, stiffness or swelling Skin: Denies  skin rash, easy bruising or bleeding or hives Endoc:  Denies polyuria, polydipsia , polyphagia or weight change Psych:   Denies depression, insomnia or hallucinations   Other:  All other systems negative   VS: BP 121/65 (BP Location: Left Arm)   Pulse 76   Temp 97.7 F (36.5 C)   Resp (!) 22   Ht 4\' 9"  (1.448 m)   Wt 78.1 kg   SpO2 95%   BMI 37.26 kg/m      PHYSICAL EXAM    GENERAL:NAD, no fevers, chills, no weakness no fatigue HEAD: Normocephalic, atraumatic.  EYES: Pupils equal, round, reactive to light. Extraocular muscles intact. No scleral icterus.  MOUTH: Moist mucosal membrane. Dentition intact. No abscess noted.  EAR,  NOSE, THROAT: Clear without exudates. No external lesions.  NECK: Supple. No thyromegaly. No nodules. No JVD.  PULMONARY: decreased breath sounds with mild rhonchi worse at bases bilaterally.  CARDIOVASCULAR: S1 and S2. Regular rate and rhythm. No murmurs, rubs, or gallops. No edema. Pedal pulses 2+ bilaterally.  GASTROINTESTINAL: Soft, nontender, nondistended. No masses. Positive bowel sounds. No hepatosplenomegaly.  MUSCULOSKELETAL: No swelling, clubbing, or edema. Range of motion full in all extremities.  NEUROLOGIC: Cranial nerves II through XII are intact. No gross focal neurological deficits. Sensation intact. Reflexes intact.  SKIN: No ulceration, lesions, rashes, or cyanosis. Skin warm and dry. Turgor intact.  PSYCHIATRIC: Mood, affect within normal limits. The  patient is awake, alert and oriented x 3. Insight, judgment intact.       IMAGING    ASSESSMENT/PLAN   Acute on chronic hypoxemic respiratory failure    - currently on 4L/min [place]    - ddimer - rule out PE    - CRP for inflammatory changes    - not viral LRTI due to normal RVP    - has diuresed well with currently at net 10L negative    -remains on torsemide  and aldactone    COPD     On solumedrol and duonebs   Atelectasis worse in LLL    - metaneb BID and IS   Tobacco dependence     - nicotine  replacment            Thank you for allowing me to participate in the care of this patient.   Patient/Family are satisfied with care plan and all questions have been answered.    Provider disclosure: Patient with at least one acute or chronic illness or injury that poses a threat to life or bodily function and is being managed actively during this encounter.  All of the below services have been performed independently by signing provider:  review of prior documentation from internal and or external health records.  Review of previous and current lab results.  Interview and comprehensive assessment during patient  visit today. Review of current and previous chest radiographs/CT scans. Discussion of management and test interpretation with health care team and patient/family.   This document was prepared using Dragon voice recognition software and may include unintentional dictation errors.     Jestina Stephani, M.D.  Division of Pulmonary & Critical Care Medicine

## 2024-03-01 NOTE — Progress Notes (Signed)
 Progress Note   Patient: Elizabeth [name] ZOX:[id number] DOB: [birth year] DOA: 02/24/2024     6 DOS: the patient was seen and examined on 03/01/2024   Brief hospital course: 63yo with h/o HTN, HLD, class 2 obesity, tobacco dependence, and chronic pain who presented on 5/15 with SOB.  She was diagnosed with COPD with exacerbation and initially required BIPAP -> [place] O2.  She has been treated with steroids and nebulizer treatments.  Assessment and Plan:  Acute on chronic respiratory failure associated with a COPD exacerbation Patient's shortness of breath is most likely caused by acute COPD exacerbation She has history of O2-dependent COPD on 2-4L [place] O2 at home as well as at bedtime BIPAP She was treated with BIPAP on arrival She does not have fever and has mild leukocytosis (likely associated with steroids)  Chest x-ray is not consistent with pneumonia Admitted to med surg Nebulizers: scheduled Duoneb and prn albuterol  Solu-Medrol  80 mg IV BID -> Prednisone  40 mg PO daily Continue Dulera  Added incentive spirometer and flutter valve. Patient is slow to recover, ongoing significant wheezing Continue to monitor   Iron  deficient/Vitamin B12 deficient anemia Hemoglobin in 8 range No bleeding Significant B12 deficiency 107 Patient received IV iron  and 4 doses of injected B12 Changed to oral B12 and oral iron  Appears to be stable  AKI Creatinine 1.42 on presentation, now resolved to 0.93 Diuretics were held Will resume and follow renal function   Chronic HFpEF Last echo was in 05/2022 with preserved EF and grade 1 DD She takes 100 mg torsemide  daily and spironolactone  daily Will resume torsemide  at 40 mg daily and restart spironolactone  and follow renal function   HLD (hyperlipidemia) Continue atorvastatin     HTN (hypertension) Continue carvedilol    Chronic pain syndrome I have reviewed this patient in the [place] Controlled Substances Reporting System.  She is receiving  medications from only one provider and appears to be taking them as prescribed. She is not at particularly high risk of opioid misuse, diversion, or overdose.  Continue Norco   Class 2 Obesity Body mass index is 37.26 kg/m.Aaron Aas  Weight loss should be encouraged Outpatient PCP/bariatric medicine f/u encouraged Significantly low or high BMI is associated with higher medical risk including morbidity and mortality     Consultants: Pulmonology TOC team  Procedures: None  Antibiotics: None  30 Day Unplanned Readmission Risk Score    Flowsheet Row ED to Hosp-Admission (Current) from 02/24/2024 in [facility]  30 Day Unplanned Readmission Risk Score (%) 20.1 Filed at 03/01/2024 0801       This score is the patient's risk of an unplanned readmission within 30 days of being discharged (0 -100%). The score is based on dignosis, age, lab data, medications, orders, and past utilization.   Low:  0-14.9   Medium: 15-21.9   High: 22-29.9   Extreme: 30 and above           Subjective: Still very tight/wheezing.     Objective: Vitals:   03/01/24 0744 03/01/24 1204  BP: 121/65   Pulse: 76   Resp: (!) 22   Temp: 97.7 F (36.5 C)   SpO2: 100% 95%    Intake/Output Summary (Last 24 hours) at 03/01/2024 1441 Last data filed at 03/01/2024 1100 Gross per 24 hour  Intake 240 ml  Output 1950 ml  Net -1710 ml   Filed Weights   02/24/24 1338 02/24/24 1615  Weight: 72.6 kg 78.1 kg    Exam:  General:  Appears calm and comfortable and is in NAD, on 4L [place] O2, audibly wheezing from the bedside Eyes:  normal lids, iris ENT:  grossly normal hearing, lips & tongue, mmm Cardiovascular:  RRR. No LE edema.  Respiratory:   Significant wheezing with poor air movement,  Mildly increased respiratory effort. Abdomen:  soft, NT, ND Skin:  no rash or induration seen on limited exam Musculoskeletal:  grossly normal tone BUE/BLE, good ROM, no bony  abnormality Psychiatric:  blunted mood and affect, speech fluent and appropriate, AOx3 Neurologic:  CN 2-12 grossly intact, moves all extremities in coordinated fashion  Data Reviewed: I have reviewed the patient's lab results since admission.  Pertinent labs for today include:   WBC 12.9 (steroids) Hgb 8.2, stable     Family Communication: None present  Disposition: Status is: Inpatient Remains inpatient appropriate because: ongoing monitoring     Time spent: 50 minutes  Unresulted Labs (From admission, onward)     Start     Ordered   03/02/24 0500  CBC with Differential/Platelet  Tomorrow morning,   R       Question:  Specimen collection method  Answer:  Lab=Lab collect   03/01/24 1422   03/02/24 0500  Basic metabolic panel with GFR  Tomorrow morning,   R       Question:  Specimen collection method  Answer:  Lab=Lab collect   03/01/24 1422             Author: Lorita Rosa, MD 03/01/2024 2:41 PM  For on call review [email or web address].

## 2024-03-02 DIAGNOSIS — J9601 Acute respiratory failure with hypoxia: Secondary | ICD-10-CM | POA: Diagnosis not present

## 2024-03-02 LAB — CBC WITH DIFFERENTIAL/PLATELET
Abs Immature Granulocytes: 0.66 10*3/uL — ABNORMAL HIGH (ref 0.00–0.07)
Basophils Absolute: 0 10*3/uL (ref 0.0–0.1)
Basophils Relative: 0 %
Eosinophils Absolute: 0 10*3/uL (ref 0.0–0.5)
Eosinophils Relative: 0 %
HCT: 29.7 % — ABNORMAL LOW (ref 36.0–46.0)
Hemoglobin: 8.7 g/dL — ABNORMAL LOW (ref 12.0–15.0)
Immature Granulocytes: 5 %
Lymphocytes Relative: 8 %
Lymphs Abs: 1 10*3/uL (ref 0.7–4.0)
MCH: 23.2 pg — ABNORMAL LOW (ref 26.0–34.0)
MCHC: 29.3 g/dL — ABNORMAL LOW (ref 30.0–36.0)
MCV: 79.2 fL — ABNORMAL LOW (ref 80.0–100.0)
Monocytes Absolute: 0.4 10*3/uL (ref 0.1–1.0)
Monocytes Relative: 3 %
Neutro Abs: 10.9 10*3/uL — ABNORMAL HIGH (ref 1.7–7.7)
Neutrophils Relative %: 84 %
Platelets: 400 10*3/uL (ref 150–400)
RBC: 3.75 MIL/uL — ABNORMAL LOW (ref 3.87–5.11)
RDW: 18.8 % — ABNORMAL HIGH (ref 11.5–15.5)
WBC: 13 10*3/uL — ABNORMAL HIGH (ref 4.0–10.5)
nRBC: 0 % (ref 0.0–0.2)

## 2024-03-02 LAB — BASIC METABOLIC PANEL WITH GFR
Anion gap: 12 (ref 5–15)
BUN: 40 mg/dL — ABNORMAL HIGH (ref 8–23)
CO2: 28 mmol/L (ref 22–32)
Calcium: 9 mg/dL (ref 8.9–10.3)
Chloride: 96 mmol/L — ABNORMAL LOW (ref 98–111)
Creatinine, Ser: 1.09 mg/dL — ABNORMAL HIGH (ref 0.44–1.00)
GFR, Estimated: 57 mL/min — ABNORMAL LOW (ref >60)
Glucose, Bld: 184 mg/dL — ABNORMAL HIGH (ref 70–99)
Potassium: 3.7 mmol/L (ref 3.5–5.1)
Sodium: 136 mmol/L (ref 135–145)

## 2024-03-02 LAB — C-REACTIVE PROTEIN: CRP: 0.6 mg/dL (ref <1.0)

## 2024-03-02 NOTE — Progress Notes (Signed)
 PULMONOLOGY         Date: 03/02/2024,   MRN# [id number] Elizabeth [name] 02-Apr-1961     AdmissionWeight: 72.6 kg                 CurrentWeight: 78.1 kg  Referring provider: Dr Murrel Arnt   CHIEF COMPLAINT:   Acute on chronic hypoxemic respiratory failure   HISTORY OF PRESENT ILLNESS   IMs. Elizabeth [name] is a 63 year old female with history of hypertension, hyperlipidemia, truncal obesity, GERD, chronic low back pain without sciatica, chronic LE edema with pulmonary edema who presents emergency department for chief concerns of shortness of breath and hypoxemia.  She was seen by in pulmonary clinic and noted to have increased abdominal swelling, worsening LE edema and worsening dyspnea. She received aggressive diuresis on outpatient but despite this continued to have worsening anasarca. Her bloodwork showed preserved renal function and she did not have clinical signs of heart failure on examination. She was placed on BIPAP therapy in ER and has improved but continues to have significant dyspnea and hypoxemia. PCCM consultation for further evaluation and treatment.   03/02/24- patient has reduced swelling this am. She reports improvement in breathing. We are continuing diuresis.  Inflammatory biomarkers have been ordered.   PAST MEDICAL HISTORY   Past Medical History:  Diagnosis Date   Anxiety    Arm pain 07/25/2015   CHF (congestive heart failure) (HCC)    Congestive heart failure (HCC) 07/25/2015   COPD (chronic obstructive pulmonary disease) (HCC)    never been idagnosed   Depression    Hypertension    Lower extremity pain 07/25/2015   Reflux    RSD (reflex sympathetic dystrophy)    Spinal cord stimulator status    Vitamin B12 deficiency    Vitamin D  deficiency      SURGICAL HISTORY   Past Surgical History:  Procedure Laterality Date   BREAST BIOPSY Right 05/10/2023   us  bx/ venus clip/ path pending   BREAST BIOPSY Right 05/10/2023   US  RT BREAST BX W LOC  DEV 1ST LESION IMG BX SPEC US  GUIDE 05/10/2023 [facility]   SPINAL CORD STIMULATOR IMPLANT     x 2     FAMILY HISTORY   Family History  Problem Relation Age of Onset   Cirrhosis Mother    Cancer Father      SOCIAL HISTORY   Social History   Tobacco Use   Smoking status: Former    Current packs/day: 0.00    Average packs/day: 1.5 packs/day for 40.0 years (60.0 ttl pk-yrs)    Types: Cigarettes    Start date: 04/11/1978    Quit date: 04/11/2018    Years since quitting: 5.8   Smokeless tobacco: Never  Vaping Use   Vaping status: Never Used  Substance Use Topics   Alcohol use: No    Alcohol/week: 0.0 standard drinks of alcohol   Drug use: No     MEDICATIONS    Home Medication:    Current Medication:  Current Facility-Administered Medications:    albuterol  (PROVENTIL ) (2.5 MG/3ML) 0.083% nebulizer solution 2.5 mg, 2.5 mg, Nebulization, Q2H PRN, Lorita Rosa, MD, 2.5 mg at 03/01/24 1625   atorvastatin  (LIPITOR) tablet 40 mg, 40 mg, Oral, QHS, Cox, Amy N, DO, 40 mg at 03/01/24 2038   busPIRone (BUSPAR) tablet 5 mg, 5 mg, Oral, BID PRN, Lorita Rosa, MD, 5 mg at 03/01/24 1643   carvedilol  (COREG ) tablet 3.125 mg, 3.125 mg, Oral, BID WC, Cox, Amy  N, DO, 3.125 mg at 03/01/24 1638   cyanocobalamin  (VITAMIN B12) tablet 1,000 mcg, 1,000 mcg, Oral, Daily, Zhang, Dekui, MD, 1,000 mcg at 03/01/24 3086   fluticasone  furoate-vilanterol (BREO ELLIPTA ) 100-25 MCG/ACT 1 puff, 1 puff, Inhalation, Daily, Cox, Amy N, DO, 1 puff at 03/01/24 0826   heparin  injection 5,000 Units, 5,000 Units, Subcutaneous, Q8H, Cox, Amy N, DO, 5,000 Units at 03/02/24 0702   ipratropium-albuterol  (DUONEB) 0.5-2.5 (3) MG/3ML nebulizer solution 3 mL, 3 mL, Nebulization, Q6H, Lorita Rosa, MD, 3 mL at 03/02/24 5784   iron  polysaccharides (NIFEREX) capsule 150 mg, 150 mg, Oral, Daily, Zhang, Dekui, MD, 150 mg at 03/01/24 0825   LORazepam  (ATIVAN ) tablet 1 mg, 1 mg, Oral, Q6H PRN, Zhang, Dekui, MD, 1 mg  at 03/02/24 0246   methylPREDNISolone  sodium succinate (SOLU-MEDROL ) 125 mg/2 mL injection 60 mg, 60 mg, Intravenous, Q12H, Paige Monarrez, MD, 60 mg at 03/01/24 2045   nicotine  (NICODERM CQ  - dosed in mg/24 hours) patch 14 mg, 14 mg, Transdermal, Daily PRN, Cox, Amy N, DO   Oral care mouth rinse, 15 mL, Mouth Rinse, PRN, Donaciano Frizzle, MD   oxyCODONE -acetaminophen  (PERCOCET/ROXICET) 5-325 MG per tablet 1 tablet, 1 tablet, Oral, Q4H PRN, Donaciano Frizzle, MD, 1 tablet at 03/02/24 0701   pantoprazole  (PROTONIX ) EC tablet 40 mg, 40 mg, Oral, Daily, Cox, Amy N, DO, 40 mg at 03/01/24 0825   senna-docusate (Senokot-S) tablet 2 tablet, 2 tablet, Oral, BID, Donaciano Frizzle, MD, 2 tablet at 03/01/24 2039   spironolactone  (ALDACTONE ) tablet 25 mg, 25 mg, Oral, Daily, Lorita Rosa, MD, 25 mg at 03/01/24 1638   torsemide  (DEMADEX ) tablet 40 mg, 40 mg, Oral, Daily, Lorita Rosa, MD, 40 mg at 03/01/24 1638    ALLERGIES   Lisinopril     REVIEW OF SYSTEMS    Review of Systems:  Gen:  Denies  fever, sweats, chills weigh loss  HEENT: Denies blurred vision, double vision, ear pain, eye pain, hearing loss, nose bleeds, sore throat Cardiac:  No dizziness, chest pain or heaviness, chest tightness,edema Resp:   reports dyspnea chronically  Gi: Denies swallowing difficulty, stomach pain, nausea or vomiting, diarrhea, constipation, bowel incontinence Gu:  Denies bladder incontinence, burning urine Ext:   Denies Joint pain, stiffness or swelling Skin: Denies  skin rash, easy bruising or bleeding or hives Endoc:  Denies polyuria, polydipsia , polyphagia or weight change Psych:   Denies depression, insomnia or hallucinations   Other:  All other systems negative   VS: BP (P) 110/60 (BP Location: Left Arm)   Pulse (P) 70   Temp (P) 97.9 F (36.6 C) (Oral)   Resp (P) 20   Ht 4\' 9"  (1.448 m)   Wt 78.1 kg   SpO2 95%   BMI 37.26 kg/m      PHYSICAL EXAM    GENERAL:NAD, no fevers, chills, no  weakness no fatigue HEAD: Normocephalic, atraumatic.  EYES: Pupils equal, round, reactive to light. Extraocular muscles intact. No scleral icterus.  MOUTH: Moist mucosal membrane. Dentition intact. No abscess noted.  EAR, NOSE, THROAT: Clear without exudates. No external lesions.  NECK: Supple. No thyromegaly. No nodules. No JVD.  PULMONARY: decreased breath sounds with mild rhonchi worse at bases bilaterally.  CARDIOVASCULAR: S1 and S2. Regular rate and rhythm. No murmurs, rubs, or gallops. No edema. Pedal pulses 2+ bilaterally.  GASTROINTESTINAL: Soft, nontender, nondistended. No masses. Positive bowel sounds. No hepatosplenomegaly.  MUSCULOSKELETAL: No swelling, clubbing, or edema. Range of motion full in all extremities.  NEUROLOGIC: Cranial  nerves II through XII are intact. No gross focal neurological deficits. Sensation intact. Reflexes intact.  SKIN: No ulceration, lesions, rashes, or cyanosis. Skin warm and dry. Turgor intact.  PSYCHIATRIC: Mood, affect within normal limits. The patient is awake, alert and oriented x 3. Insight, judgment intact.       IMAGING    ASSESSMENT/PLAN   Acute on chronic hypoxemic respiratory failure    - currently on 4L/min [place]    - ddimer - rule out PE    - CRP for inflammatory changes    - not viral LRTI due to normal RVP    - has diuresed well with currently at net 10L negative    -remains on torsemide  and aldactone    COPD     On solumedrol and duonebs   Atelectasis worse in LLL    - metaneb BID and IS              Thank you for allowing me to participate in the care of this patient.   Patient/Family are satisfied with care plan and all questions have been answered.    Provider disclosure: Patient with at least one acute or chronic illness or injury that poses a threat to life or bodily function and is being managed actively during this encounter.  All of the below services have been performed independently by signing provider:   review of prior documentation from internal and or external health records.  Review of previous and current lab results.  Interview and comprehensive assessment during patient visit today. Review of current and previous chest radiographs/CT scans. Discussion of management and test interpretation with health care team and patient/family.   This document was prepared using Dragon voice recognition software and may include unintentional dictation errors.     Phelan Goers, M.D.  Division of Pulmonary & Critical Care Medicine

## 2024-03-02 NOTE — Progress Notes (Addendum)
 Progress Note   Patient: Elizabeth [name] XLK:[id number] DOB: August 03, 1961 DOA: 02/24/2024     7 DOS: the patient was seen and examined on 03/02/2024   Brief hospital course: 63yo with h/o HTN, HLD, class 2 obesity, tobacco dependence, and chronic pain who presented on 5/15 with SOB.  She was diagnosed with COPD with exacerbation and initially required BIPAP -> [place] O2.  She has been treated with steroids and nebulizer treatments.  Assessment and Plan:  Acute on chronic respiratory failure associated with a COPD exacerbation Patient's shortness of breath is most likely caused by acute COPD exacerbation She has history of O2-dependent COPD on 2-4L [place] O2 at home as well as at bedtime BIPAP She was treated with BIPAP on arrival She does not have fever and has mild leukocytosis (likely associated with steroids)  Chest x-ray is not consistent with pneumonia Admitted to med surg Nebulizers: scheduled Duoneb and prn albuterol  Solu-Medrol  80 mg IV BID -> Prednisone  40 mg PO daily Continue Dulera  Added incentive spirometer and flutter valve Patient is slow to recover, ongoing significant wheezing but improving air movement Pulmonology is consulting   Iron  deficient/Vitamin B12 deficient anemia Hemoglobin in 8 range No bleeding Significant B12 deficiency 107 Patient received IV iron  and 4 doses of injected B12 Changed to oral B12 and oral iron  Hgb stable/improving   AKI on stage 3a CKD Creatinine 1.42 on presentation, now resolved to 0.93 Diuretics were held but have been resumed Avoid nephrotoxic agents   Chronic HFpEF Last echo was in 05/2022 with preserved EF and grade 1 DD She takes 100 mg torsemide  daily and spironolactone  daily Will resume torsemide  at 40 mg daily and restart spironolactone  and follow renal function   HLD (hyperlipidemia) Continue atorvastatin     HTN (hypertension) Continue carvedilol    Chronic pain syndrome I have reviewed this patient in the [place]  Controlled Substances Reporting System.  She is receiving medications from only one provider and appears to be taking them as prescribed. She is not at particularly high risk of opioid misuse, diversion, or overdose.  Continue Norco   Class 2 Obesity Body mass index is 37.26 kg/m.Aaron Aas  Weight loss should be encouraged Outpatient PCP/bariatric medicine f/u encouraged Significantly low or high BMI is associated with higher medical risk including morbidity and mortality        Consultants: Pulmonology TOC team   Procedures: None   Antibiotics: None  30 Day Unplanned Readmission Risk Score    Flowsheet Row ED to Hosp-Admission (Current) from 02/24/2024 in [facility]  30 Day Unplanned Readmission Risk Score (%) 20.95 Filed at 03/02/2024 0400       This score is the patient's risk of an unplanned readmission within 30 days of being discharged (0 -100%). The score is based on dignosis, age, lab data, medications, orders, and past utilization.   Low:  0-14.9   Medium: 15-21.9   High: 22-29.9   Extreme: 30 and above           Subjective: Feeling slightly better, breathing is less labored.   Objective: Vitals:   03/02/24 0735 03/02/24 0906  BP:  135/72  Pulse:  73  Resp:  20  Temp:  98.7 F (37.1 C)  SpO2: 95% 96%    Intake/Output Summary (Last 24 hours) at 03/02/2024 1312 Last data filed at 03/02/2024 1100 Gross per 24 hour  Intake 240 ml  Output 2000 ml  Net -1760 ml   Filed Weights   02/24/24 1338  02/24/24 1615  Weight: 72.6 kg 78.1 kg    Exam:  General:  Appears calm and comfortable and is in NAD, on 4L [place] O2, audibly wheezing from the bedside but somewhat better from yesterday Eyes:  normal lids, iris ENT:  grossly normal hearing, lips & tongue, mmm Cardiovascular:  RRR. 1+ LE edema.  Respiratory:   Significant wheezing with slightly improved air movement,  Mildly increased respiratory effort. Abdomen:  soft, NT,  ND Skin:  no rash or induration seen on limited exam Musculoskeletal:  grossly normal tone BUE/BLE, no bony abnormality; LE are very TTP Psychiatric:  blunted mood and affect, speech fluent and appropriate, AOx3 Neurologic:  CN 2-12 grossly intact, moves all extremities in coordinated fashion  Data Reviewed: I have reviewed the patient's lab results since admission.  Pertinent labs for today include:   Glucose 184 BUN 40/Creatinine 1.09/GFR 57 WBC 13 Hgb 8.7, stable D-dimer <0.27  CRP 0.6   Family Communication: None present  Disposition: Status is: Inpatient Remains inpatient appropriate because: ongoing management     Time spent: 50 minutes  Unresulted Labs (From admission, onward)     Start     Ordered   03/03/24 0500  CBC with Differential/Platelet  Tomorrow morning,   R       Question:  Specimen collection method  Answer:  Lab=Lab collect   03/02/24 1311   03/03/24 0500  Basic metabolic panel with GFR  Tomorrow morning,   R       Question:  Specimen collection method  Answer:  Lab=Lab collect   03/02/24 1311             Author: Lorita Rosa, MD 03/02/2024 1:12 PM  For on call review [email or web address].

## 2024-03-03 DIAGNOSIS — J9621 Acute and chronic respiratory failure with hypoxia: Secondary | ICD-10-CM

## 2024-03-03 LAB — CBC WITH DIFFERENTIAL/PLATELET
Abs Immature Granulocytes: 0.37 10*3/uL — ABNORMAL HIGH (ref 0.00–0.07)
Basophils Absolute: 0 10*3/uL (ref 0.0–0.1)
Basophils Relative: 0 %
Eosinophils Absolute: 0 10*3/uL (ref 0.0–0.5)
Eosinophils Relative: 0 %
HCT: 29.7 % — ABNORMAL LOW (ref 36.0–46.0)
Hemoglobin: 8.6 g/dL — ABNORMAL LOW (ref 12.0–15.0)
Immature Granulocytes: 3 %
Lymphocytes Relative: 7 %
Lymphs Abs: 0.9 10*3/uL (ref 0.7–4.0)
MCH: 23.1 pg — ABNORMAL LOW (ref 26.0–34.0)
MCHC: 29 g/dL — ABNORMAL LOW (ref 30.0–36.0)
MCV: 79.6 fL — ABNORMAL LOW (ref 80.0–100.0)
Monocytes Absolute: 0.3 10*3/uL (ref 0.1–1.0)
Monocytes Relative: 2 %
Neutro Abs: 11.9 10*3/uL — ABNORMAL HIGH (ref 1.7–7.7)
Neutrophils Relative %: 88 %
Platelets: 373 10*3/uL (ref 150–400)
RBC: 3.73 MIL/uL — ABNORMAL LOW (ref 3.87–5.11)
RDW: 18.7 % — ABNORMAL HIGH (ref 11.5–15.5)
WBC: 13.5 10*3/uL — ABNORMAL HIGH (ref 4.0–10.5)
nRBC: 0 % (ref 0.0–0.2)

## 2024-03-03 LAB — BASIC METABOLIC PANEL WITH GFR
Anion gap: 11 (ref 5–15)
BUN: 49 mg/dL — ABNORMAL HIGH (ref 8–23)
CO2: 27 mmol/L (ref 22–32)
Calcium: 8.3 mg/dL — ABNORMAL LOW (ref 8.9–10.3)
Chloride: 90 mmol/L — ABNORMAL LOW (ref 98–111)
Creatinine, Ser: 1.1 mg/dL — ABNORMAL HIGH (ref 0.44–1.00)
GFR, Estimated: 56 mL/min — ABNORMAL LOW (ref >60)
Glucose, Bld: 201 mg/dL — ABNORMAL HIGH (ref 70–99)
Potassium: 3.6 mmol/L (ref 3.5–5.1)
Sodium: 128 mmol/L — ABNORMAL LOW (ref 135–145)

## 2024-03-03 MED ORDER — NICOTINE 14 MG/24HR TD PT24: 14.0000 mg | MEDICATED_PATCH | Freq: Every day | TRANSDERMAL | 0 refills | Status: DC | PRN

## 2024-03-03 MED ORDER — SODIUM CHLORIDE 1 G PO TABS
1.0000 g | ORAL_TABLET | Freq: Three times a day (TID) | ORAL | Status: DC
  Administered 2024-03-03: 1 g via ORAL
  Filled 2024-03-03: qty 1

## 2024-03-03 MED ORDER — CYANOCOBALAMIN 1000 MCG PO TABS: 1000.0000 ug | ORAL_TABLET | Freq: Every day | ORAL | 0 refills | Status: DC

## 2024-03-03 MED ORDER — PREDNISONE 10 MG PO TABS: ORAL_TABLET | ORAL | 0 refills | Status: AC

## 2024-03-03 MED ORDER — POLYSACCHARIDE IRON COMPLEX 150 MG PO CAPS: 150.0000 mg | ORAL_CAPSULE | Freq: Every day | ORAL | 0 refills | Status: AC

## 2024-03-03 MED ORDER — SODIUM CHLORIDE 1 G PO TABS: 1.0000 g | ORAL_TABLET | Freq: Three times a day (TID) | ORAL | 0 refills | Status: AC

## 2024-03-03 MED ORDER — BUSPIRONE HCL 5 MG PO TABS: 5.0000 mg | ORAL_TABLET | Freq: Two times a day (BID) | ORAL | 0 refills | Status: DC | PRN

## 2024-03-03 NOTE — Progress Notes (Signed)
 PULMONOLOGY         Date: 03/03/2024,   MRN# [id number] Elizabeth [name] 20-Nov-1960     AdmissionWeight: 72.6 kg                 CurrentWeight: 78.1 kg  Referring provider: Dr Murrel Arnt   CHIEF COMPLAINT:   Acute on chronic hypoxemic respiratory failure   HISTORY OF PRESENT ILLNESS   IMs. Elizabeth [name] is a 63 year old female with history of hypertension, hyperlipidemia, truncal obesity, GERD, chronic low back pain without sciatica, chronic LE edema with pulmonary edema who presents emergency department for chief concerns of shortness of breath and hypoxemia.  She was seen by in pulmonary clinic and noted to have increased abdominal swelling, worsening LE edema and worsening dyspnea. She received aggressive diuresis on outpatient but despite this continued to have worsening anasarca. Her bloodwork showed preserved renal function and she did not have clinical signs of heart failure on examination. She was placed on BIPAP therapy in ER and has improved but continues to have significant dyspnea and hypoxemia. PCCM consultation for further evaluation and treatment.   03/02/24- patient has reduced swelling this am. She reports improvement in breathing. We are continuing diuresis.  Inflammatory biomarkers have been ordered.   03/03/24- patient had normal inflammatory biomarkers.  She has diursed >11L and feels much better. She admits to anxiety and states her breathing has improved with addition of ativan .   PAST MEDICAL HISTORY   Past Medical History:  Diagnosis Date   Anxiety    Arm pain 07/25/2015   CHF (congestive heart failure) (HCC)    Congestive heart failure (HCC) 07/25/2015   COPD (chronic obstructive pulmonary disease) (HCC)    never been idagnosed   Depression    Hypertension    Lower extremity pain 07/25/2015   Reflux    RSD (reflex sympathetic dystrophy)    Spinal cord stimulator status    Vitamin B12 deficiency    Vitamin D  deficiency      SURGICAL  HISTORY   Past Surgical History:  Procedure Laterality Date   BREAST BIOPSY Right 05/10/2023   us  bx/ venus clip/ path pending   BREAST BIOPSY Right 05/10/2023   US  RT BREAST BX W LOC DEV 1ST LESION IMG BX SPEC US  GUIDE 05/10/2023 [facility]   SPINAL CORD STIMULATOR IMPLANT     x 2     FAMILY HISTORY   Family History  Problem Relation Age of Onset   Cirrhosis Mother    Cancer Father      SOCIAL HISTORY   Social History   Tobacco Use   Smoking status: Former    Current packs/day: 0.00    Average packs/day: 1.5 packs/day for 40.0 years (60.0 ttl pk-yrs)    Types: Cigarettes    Start date: 04/11/1978    Quit date: 04/11/2018    Years since quitting: 5.8   Smokeless tobacco: Never  Vaping Use   Vaping status: Never Used  Substance Use Topics   Alcohol use: No    Alcohol/week: 0.0 standard drinks of alcohol   Drug use: No     MEDICATIONS    Home Medication:    Current Medication:  Current Facility-Administered Medications:    albuterol  (PROVENTIL ) (2.5 MG/3ML) 0.083% nebulizer solution 2.5 mg, 2.5 mg, Nebulization, Q2H PRN, Lorita Rosa, MD, 2.5 mg at 03/01/24 1625   atorvastatin  (LIPITOR) tablet 40 mg, 40 mg, Oral, QHS, Cox, Amy N, DO, 40 mg at 03/02/24 2143   busPIRone (  BUSPAR) tablet 5 mg, 5 mg, Oral, BID PRN, Lorita Rosa, MD, 5 mg at 03/01/24 1643   carvedilol  (COREG ) tablet 3.125 mg, 3.125 mg, Oral, BID WC, Cox, Amy N, DO, 3.125 mg at 03/03/24 0830   cyanocobalamin  (VITAMIN B12) tablet 1,000 mcg, 1,000 mcg, Oral, Daily, Zhang, Dekui, MD, 1,000 mcg at 03/03/24 0831   fluticasone  furoate-vilanterol (BREO ELLIPTA ) 100-25 MCG/ACT 1 puff, 1 puff, Inhalation, Daily, Cox, Amy N, DO, 1 puff at 03/02/24 0913   heparin  injection 5,000 Units, 5,000 Units, Subcutaneous, Q8H, Cox, Amy N, DO, 5,000 Units at 03/03/24 0533   ipratropium-albuterol  (DUONEB) 0.5-2.5 (3) MG/3ML nebulizer solution 3 mL, 3 mL, Nebulization, Q6H, Lorita Rosa, MD, 3 mL at 03/03/24 0755    iron  polysaccharides (NIFEREX) capsule 150 mg, 150 mg, Oral, Daily, Zhang, Dekui, MD, 150 mg at 03/03/24 8295   LORazepam  (ATIVAN ) tablet 1 mg, 1 mg, Oral, Q6H PRN, Zhang, Dekui, MD, 1 mg at 03/03/24 6213   methylPREDNISolone  sodium succinate (SOLU-MEDROL ) 125 mg/2 mL injection 60 mg, 60 mg, Intravenous, Q12H, Sheilia Reznick, MD, 60 mg at 03/03/24 0865   nicotine  (NICODERM CQ  - dosed in mg/24 hours) patch 14 mg, 14 mg, Transdermal, Daily PRN, Cox, Amy N, DO   Oral care mouth rinse, 15 mL, Mouth Rinse, PRN, Donaciano Frizzle, MD   oxyCODONE -acetaminophen  (PERCOCET/ROXICET) 5-325 MG per tablet 1 tablet, 1 tablet, Oral, Q4H PRN, Donaciano Frizzle, MD, 1 tablet at 03/03/24 7846   pantoprazole  (PROTONIX ) EC tablet 40 mg, 40 mg, Oral, Daily, Cox, Amy N, DO, 40 mg at 03/03/24 0831   senna-docusate (Senokot-S) tablet 2 tablet, 2 tablet, Oral, BID, Donaciano Frizzle, MD, 2 tablet at 03/02/24 0911   sodium chloride  tablet 1 g, 1 g, Oral, TID WC, Lorita Rosa, MD   spironolactone  (ALDACTONE ) tablet 25 mg, 25 mg, Oral, Daily, Lorita Rosa, MD, 25 mg at 03/03/24 0831   torsemide  (DEMADEX ) tablet 40 mg, 40 mg, Oral, Daily, Lorita Rosa, MD, 40 mg at 03/03/24 0830    ALLERGIES   Lisinopril     REVIEW OF SYSTEMS    Review of Systems:  Gen:  Denies  fever, sweats, chills weigh loss  HEENT: Denies blurred vision, double vision, ear pain, eye pain, hearing loss, nose bleeds, sore throat Cardiac:  No dizziness, chest pain or heaviness, chest tightness,edema Resp:   reports dyspnea chronically  Gi: Denies swallowing difficulty, stomach pain, nausea or vomiting, diarrhea, constipation, bowel incontinence Gu:  Denies bladder incontinence, burning urine Ext:   Denies Joint pain, stiffness or swelling Skin: Denies  skin rash, easy bruising or bleeding or hives Endoc:  Denies polyuria, polydipsia , polyphagia or weight change Psych:   Denies depression, insomnia or hallucinations   Other:  All other systems  negative   VS: BP 125/60 (BP Location: Left Arm)   Pulse 78   Temp 97.9 F (36.6 C) (Axillary)   Resp (!) 22   Ht 4\' 9"  (1.448 m)   Wt 78.1 kg   SpO2 97%   BMI 37.26 kg/m      PHYSICAL EXAM    GENERAL:NAD, no fevers, chills, no weakness no fatigue HEAD: Normocephalic, atraumatic.  EYES: Pupils equal, round, reactive to light. Extraocular muscles intact. No scleral icterus.  MOUTH: Moist mucosal membrane. Dentition intact. No abscess noted.  EAR, NOSE, THROAT: Clear without exudates. No external lesions.  NECK: Supple. No thyromegaly. No nodules. No JVD.  PULMONARY: decreased breath sounds with mild rhonchi worse at bases bilaterally.  CARDIOVASCULAR: S1 and S2. Regular  rate and rhythm. No murmurs, rubs, or gallops. No edema. Pedal pulses 2+ bilaterally.  GASTROINTESTINAL: Soft, nontender, nondistended. No masses. Positive bowel sounds. No hepatosplenomegaly.  MUSCULOSKELETAL: No swelling, clubbing, or edema. Range of motion full in all extremities.  NEUROLOGIC: Cranial nerves II through XII are intact. No gross focal neurological deficits. Sensation intact. Reflexes intact.  SKIN: No ulceration, lesions, rashes, or cyanosis. Skin warm and dry. Turgor intact.  PSYCHIATRIC: Mood, affect within normal limits. The patient is awake, alert and oriented x 3. Insight, judgment intact.       IMAGING    ASSESSMENT/PLAN   Acute on chronic hypoxemic respiratory failure    - currently on 4L/min [place]    - ddimer - rule out PE    - CRP for inflammatory changes    - not viral LRTI due to normal RVP    - has diuresed well with currently at net 10L negative    -remains on torsemide  and aldactone    COPD     On solumedrol and duonebs   Atelectasis worse in LLL    - metaneb BID and IS          Thank you for allowing me to participate in the care of this patient.   Patient/Family are satisfied with care plan and all questions have been answered.    Provider  disclosure: Patient with at least one acute or chronic illness or injury that poses a threat to life or bodily function and is being managed actively during this encounter.  All of the below services have been performed independently by signing provider:  review of prior documentation from internal and or external health records.  Review of previous and current lab results.  Interview and comprehensive assessment during patient visit today. Review of current and previous chest radiographs/CT scans. Discussion of management and test interpretation with health care team and patient/family.   This document was prepared using Dragon voice recognition software and may include unintentional dictation errors.     Linette Gunderson, M.D.  Division of Pulmonary & Critical Care Medicine

## 2024-03-03 NOTE — Plan of Care (Signed)
  Problem: Education: Goal: Knowledge of General Education information will improve Description: Including pain rating scale, medication(s)/side effects and non-pharmacologic comfort measures 03/03/2024 1139 by Apolinar Baxter, RN Outcome: Adequate for Discharge 03/03/2024 1138 by Apolinar Baxter, RN Outcome: Progressing   Problem: Health Behavior/Discharge Planning: Goal: Ability to manage health-related needs will improve 03/03/2024 1139 by Apolinar Baxter, RN Outcome: Adequate for Discharge 03/03/2024 1138 by Apolinar Baxter, RN Outcome: Progressing   Problem: Clinical Measurements: Goal: Ability to maintain clinical measurements within normal limits will improve 03/03/2024 1139 by Apolinar Baxter, RN Outcome: Adequate for Discharge 03/03/2024 1138 by Apolinar Baxter, RN Outcome: Progressing Goal: Will remain free from infection Outcome: Adequate for Discharge Goal: Diagnostic test results will improve Outcome: Adequate for Discharge Goal: Respiratory complications will improve Outcome: Adequate for Discharge Goal: Cardiovascular complication will be avoided Outcome: Adequate for Discharge   Problem: Activity: Goal: Risk for activity intolerance will decrease Outcome: Adequate for Discharge   Problem: Nutrition: Goal: Adequate nutrition will be maintained Outcome: Adequate for Discharge   Problem: Coping: Goal: Level of anxiety will decrease Outcome: Adequate for Discharge   Problem: Elimination: Goal: Will not experience complications related to bowel motility Outcome: Adequate for Discharge Goal: Will not experience complications related to urinary retention Outcome: Adequate for Discharge   Problem: Pain Managment: Goal: General experience of comfort will improve and/or be controlled Outcome: Adequate for Discharge   Problem: Safety: Goal: Ability to remain free from injury will improve Outcome: Adequate for Discharge   Problem: Skin Integrity: Goal: Risk for  impaired skin integrity will decrease Outcome: Adequate for Discharge

## 2024-03-03 NOTE — Plan of Care (Signed)

## 2024-03-03 NOTE — TOC Progression Note (Signed)
 Transition of Care [facility]) - Progression Note    Patient Details  Name: Elizabeth [name] MRN: [id number] Date of Birth: 1961/07/20  [facility] CM/SW Contact  Loman Risk, RN Phone Number: 03/03/2024, 9:11 AM  Clinical Narrative:      Documented patient is on 2L [place].  Documented that patient wears 2-4 L at baseline.  No anticipated TOC needs.  Please consult if TOC needs arise    Expected Discharge Plan: Home/Self Care Barriers to Discharge: Continued Medical Work up  Expected Discharge Plan and Services     Post Acute Care Choice: NA Living arrangements for the past 2 months: Single Family Home                                       Social Determinants of Health (SDOH) Interventions SDOH Screenings   Food Insecurity: No Food Insecurity (02/24/2024)  Housing: Low Risk  (02/24/2024)  Transportation Needs: No Transportation Needs (02/24/2024)  Utilities: Not At Risk (02/24/2024)  Depression (PHQ2-9): Low Risk  (12/13/2023)  Financial Resource Strain: Low Risk  (02/24/2024)   Received from [facility]  Physical Activity: Unknown (08/14/2018)  Social Connections: Unknown (08/14/2018)  Stress: Unknown (08/14/2018)  Tobacco Use: Medium Risk (02/24/2024)    Readmission Risk Interventions    02/28/2024   11:39 AM 07/31/2022    3:39 PM 05/16/2022   11:11 AM  Readmission Risk Prevention Plan  Transportation Screening Complete Complete Complete  PCP or Specialist Appt within 3-5 Days Complete Complete Complete  HRI or Home Care Consult  Complete   Social Work Consult for Recovery Care Planning/Counseling Complete Complete Complete  Palliative Care Screening Not Applicable Not Applicable Not Applicable  Medication Review Oceanographer) Complete Complete Complete

## 2024-03-03 NOTE — Progress Notes (Signed)
 Removed IV-CDI. Reviewed d/c paperwork with patient and answered questions. Wheeled stable patient and belongings to main entrance where she was picked up by her son.

## 2024-03-03 NOTE — Plan of Care (Signed)

## 2024-03-03 NOTE — Discharge Summary (Signed)
 Physician Discharge Summary   Patient: Elizabeth [name] MRN: [id number] DOB: 1961-06-30  Admit date:     02/24/2024  Discharge date: 03/03/24  Discharge Physician: Lorita Rosa   PCP: Evelena Hines, FNP (Inactive)   Recommendations at discharge:   Stop smoking!  Nicotine  patch provided. Use prednisone  taper starting at 40 mg daily for 3 days and decreasing by 10 mg every 3 days Buspirone  provided for anxiety Vitamin B12 and iron  supplements ordered Salt tablets provided; take 3 times per day with meals for 3 days Follow up with PCP in 1-2 weeks Follow up with Dr. Aleskerov as instructed You are being referred for lung cancer screening and should be contacted to set up an appointment  Discharge Diagnoses: Principal Problem:   Acute on chronic respiratory failure with hypoxia (HCC) Active Problems:   Chronic diastolic CHF (congestive heart failure) (HCC)   Anxiety   COPD exacerbation (HCC)   HLD (hyperlipidemia)   HTN (hypertension)   Constipation   Chronic pain syndrome   History of tobacco abuse   Morbid (severe) obesity due to excess calories (HCC)   Hyponatremia   Vitamin B12 deficient megaloblastic anemia   Hospital Course: 63yo with h/o HTN, HLD, class 2 obesity, tobacco dependence, and chronic pain who presented on 5/15 with SOB.  She was diagnosed with COPD with exacerbation and initially required BIPAP -> [place] O2.  She has been treated with steroids and nebulizer treatments.  Assessment and Plan:  Acute on chronic respiratory failure associated with a COPD exacerbation Patient's shortness of breath is most likely caused by acute COPD exacerbation She has history of O2-dependent COPD on 2-4L [place] O2 at home as well as at bedtime BIPAP She was treated with BIPAP on arrival She does not have fever and has mild leukocytosis (likely associated with steroids)  Chest x-ray is not consistent with pneumonia Admitted to med surg Nebulizers: scheduled Duoneb and prn  albuterol  Solu-Medrol  80 mg IV BID -> Prednisone  40 mg PO daily Continue Dulera  Added incentive spirometer and flutter valve Patient is slow to recover, ongoing significant wheezing but improving air movement, feels ready for dc today Pulmonology is consulting and agrees that she is appropriate for dc   Iron  deficient/Vitamin B12 deficient anemia Hemoglobin in 8 range No bleeding Significant B12 deficiency 107 Patient received IV iron  and 4 doses of injected B12 Changed to oral B12 and oral iron  Hgb stable/improving   AKI on stage 3a CKD Creatinine 1.42 on presentation, now resolved to 0.93 Diuretics were held but have been resumed Avoid nephrotoxic agents  Hyponatremia Likely associated with AKI and COPD as well as diuresis Will give Na++ tablets x 3 days Outpatient f/u recommended   Chronic HFpEF Last echo was in 05/2022 with preserved EF and grade 1 DD She takes 100 mg torsemide  daily and spironolactone  daily Resumed torsemide  at 40 mg daily and restart spironolactone  and follow renal function   HLD (hyperlipidemia) Continue atorvastatin     HTN (hypertension) Continue carvedilol    Chronic pain syndrome I have reviewed this patient in the [place] Controlled Substances Reporting System.  She is receiving medications from only one provider and appears to be taking them as prescribed. She is not at particularly high risk of opioid misuse, diversion, or overdose.  Continue Norco  Anxiety Added buspirone  as need for anxiety   Class 2 Obesity Body mass index is 37.26 kg/m.Aaron Aas  Weight loss should be encouraged Outpatient PCP/bariatric medicine f/u encouraged Significantly low or high BMI is associated  with higher medical risk including morbidity and mortality        Consultants: Pulmonology TOC team   Procedures: None   Antibiotics: None   Pain control - [place]  Controlled Substance Reporting System database was reviewed. and patient was instructed, not to  drive, operate heavy machinery, perform activities at heights, swimming or participation in water  activities or provide baby-sitting services while on Pain, Sleep and Anxiety Medications; until their outpatient Physician has advised to do so again. Also recommended to not to take more than prescribed Pain, Sleep and Anxiety Medications.   Disposition: Home Diet recommendation:  Cardiac diet DISCHARGE MEDICATION: Allergies as of 03/03/2024       Reactions   Lisinopril Anaphylaxis   Other Reaction(s): edema of face and throat        Medication List     STOP taking these medications    metoprolol  tartrate 100 MG tablet Commonly known as: LOPRESSOR        TAKE these medications    albuterol  108 (90 Base) MCG/ACT inhaler Commonly known as: VENTOLIN  HFA Inhale 2 puffs into the lungs every 6 (six) hours as needed for wheezing or shortness of breath.   albuterol  (2.5 MG/3ML) 0.083% nebulizer solution Commonly known as: PROVENTIL  Take 3 mLs (2.5 mg total) by nebulization every 4 (four) hours as needed for shortness of breath or wheezing.   atorvastatin  40 MG tablet Commonly known as: LIPITOR Take 1 tablet (40 mg total) by mouth at bedtime.   busPIRone 5 MG tablet Commonly known as: BUSPAR Take 1 tablet (5 mg total) by mouth 2 (two) times daily as needed (anxiety).   carvedilol  3.125 MG tablet Commonly known as: COREG  Take 1 tablet (3.125 mg total) by mouth 2 (two) times daily with a meal.   cyanocobalamin  1000 MCG tablet Take 1 tablet (1,000 mcg total) by mouth daily. Start taking on: Mar 04, 2024   HYDROcodone -acetaminophen  5-325 MG tablet Commonly known as: NORCO/VICODIN Take 1 tablet by mouth every 6 (six) hours as needed for severe pain (pain score 7-10). Must last 30 days. What changed: Another medication with the same name was removed. Continue taking this medication, and follow the directions you see here.   iron  polysaccharides 150 MG capsule Commonly known as:  NIFEREX Take 1 capsule (150 mg total) by mouth daily. Start taking on: Mar 04, 2024   mometasone -formoterol  100-5 MCG/ACT Aero Commonly known as: DULERA  Inhale 2 puffs into the lungs 2 (two) times daily.   naloxone  4 MG/0.1ML Liqd nasal spray kit Commonly known as: NARCAN  Place 1 spray into the nose as needed for up to 365 doses (for opioid-induced respiratory depresssion). In case of emergency (overdose), spray once into each nostril. If no response within 3 minutes, repeat application and call 911.   nicotine  14 mg/24hr patch Commonly known as: NICODERM CQ  - dosed in mg/24 hours Place 1 patch (14 mg total) onto the skin daily as needed (nicotine  craving).   pantoprazole  40 MG tablet Commonly known as: PROTONIX  Take 40 mg by mouth daily.   potassium chloride  SA 20 MEQ tablet Commonly known as: KLOR-CON  M Take 20 mEq by mouth daily.   predniSONE  10 MG tablet Commonly known as: DELTASONE  Take 4 tablets (40 mg total) by mouth daily for 3 days, THEN 3 tablets (30 mg total) daily for 3 days, THEN 2 tablets (20 mg total) daily for 3 days, THEN 1 tablet (10 mg total) daily for 3 days. Start taking on: Mar 03, 2024  sodium chloride  1 g tablet Take 1 tablet (1 g total) by mouth 3 (three) times daily with meals for 3 days.   spironolactone  25 MG tablet Commonly known as: ALDACTONE  Take 25 mg by mouth daily.   torsemide  20 MG tablet Commonly known as: DEMADEX  Take 1 tablet (20 mg total) by mouth daily. What changed: how much to take        Discharge Exam:   Subjective: Feeling better, wants to go home today.     Objective: Vitals:   03/03/24 0340 03/03/24 0826  BP: (!) 114/58 125/60  Pulse: 73 78  Resp: 20 (!) 22  Temp: (!) 97.5 F (36.4 C) 97.9 F (36.6 C)  SpO2: 97% 97%    Intake/Output Summary (Last 24 hours) at 03/03/2024 1130 Last data filed at 03/03/2024 0340 Gross per 24 hour  Intake 0 ml  Output 750 ml  Net -750 ml   Filed Weights   02/24/24 1338  02/24/24 1615  Weight: 72.6 kg 78.1 kg    Exam:  General:  Appears calm and comfortable and is in NAD, on 2L [place] O2, appears much more comfortable with her breathing Eyes:  normal lids, iris ENT:  grossly normal hearing, lips & tongue, mmm Cardiovascular:  RRR. No LE edema.  Respiratory:   Improved air movement, decreased wheezing, normal respiratory effort. Abdomen:  soft, NT, ND Skin:  no rash or induration seen on limited exam Musculoskeletal:  grossly normal tone BUE/BLE, no bony abnormality; LE are very TTP Psychiatric:  blunted mood and affect, speech fluent and appropriate, AOx3 Neurologic:  CN 2-12 grossly intact, moves all extremities in coordinated fashion  Data Reviewed: I have reviewed the patient's lab results since admission.  Pertinent labs for today include:   Na++ 128 Glucose 201 BUN 49/Creatinine 1/10/GFR 56, stable WBC 13.5 Hgb 8.6, stable    Condition at discharge: improving  The results of significant diagnostics from this hospitalization (including imaging, microbiology, ancillary and laboratory) are listed below for reference.   Imaging Studies: US  Venous Img Lower Bilateral (DVT) Result Date: 02/25/2024 CLINICAL DATA:  Pain in swelling in the legs bilaterally. EXAM: Bilateral LOWER EXTREMITY VENOUS DOPPLER ULTRASOUND TECHNIQUE: Gray-scale sonography with compression, as well as color and duplex ultrasound, were performed to evaluate the deep venous system(s) from the level of the common femoral vein through the popliteal and proximal calf veins. COMPARISON:  Left lower extremity venous Doppler ultrasound March 16, 2006. Right lower extremity venous Doppler ultrasound August 15, 2010 FINDINGS: VENOUS Normal compressibility of the common femoral, superficial femoral, and popliteal veins, as well as the visualized calf veins. Visualized portions of profunda femoral vein and great saphenous vein unremarkable. No filling defects to suggest DVT on grayscale or color  Doppler imaging. Doppler waveforms show normal direction of venous flow, normal respiratory plasticity and response to augmentation. Limited views of the contralateral common femoral vein are unremarkable. OTHER None. Limitations: none IMPRESSION: No DVT bilateral lower extremities. Electronically Signed   By: Susan Ensign   On: 02/25/2024 07:55   DG Chest Port 1 View Result Date: 02/24/2024 CLINICAL DATA:  Shortness of breath EXAM: PORTABLE CHEST 1 VIEW COMPARISON:  10/03/2022 FINDINGS: Ascending stimulator leads over the cervicothoracic region. Mild cardiomegaly. Increased bronchitic changes at the bases. No pleural effusion or pneumothorax. Aortic atherosclerosis. IMPRESSION: Mild cardiomegaly. Increased bronchitic changes at the bases, possible bronchitis. Electronically Signed   By: Esmeralda Hedge M.D.   On: 02/24/2024 15:22    Microbiology: Results for orders placed  or performed during the hospital encounter of 02/24/24  MRSA Next Gen by PCR, Nasal     Status: None   Collection Time: 02/24/24  2:27 PM   Specimen: Nasal Mucosa; Nasal Swab  Result Value Ref Range Status   MRSA by PCR Next Gen NOT DETECTED NOT DETECTED Final    Comment: (NOTE) The GeneXpert MRSA Assay (FDA approved for NASAL specimens only), is one component of a comprehensive MRSA colonization surveillance program. It is not intended to diagnose MRSA infection nor to guide or monitor treatment for MRSA infections. Test performance is not FDA approved in patients less than 70 years old. Performed at [facility], [address] [place], [place] [address]   Respiratory (~20 pathogens) panel by PCR     Status: None   Collection Time: 02/25/24 10:53 AM   Specimen: Nasopharyngeal Swab; Respiratory  Result Value Ref Range Status   Adenovirus NOT DETECTED NOT DETECTED Final   Coronavirus 229E NOT DETECTED NOT DETECTED Final    Comment: (NOTE) The Coronavirus on the Respiratory Panel, DOES NOT test for the  novel  Coronavirus (2019 nCoV)    Coronavirus HKU1 NOT DETECTED NOT DETECTED Final   Coronavirus NL63 NOT DETECTED NOT DETECTED Final   Coronavirus OC43 NOT DETECTED NOT DETECTED Final   Metapneumovirus NOT DETECTED NOT DETECTED Final   Rhinovirus / Enterovirus NOT DETECTED NOT DETECTED Final   Influenza A NOT DETECTED NOT DETECTED Final   Influenza B NOT DETECTED NOT DETECTED Final   Parainfluenza Virus 1 NOT DETECTED NOT DETECTED Final   Parainfluenza Virus 2 NOT DETECTED NOT DETECTED Final   Parainfluenza Virus 3 NOT DETECTED NOT DETECTED Final   Parainfluenza Virus 4 NOT DETECTED NOT DETECTED Final   Respiratory Syncytial Virus NOT DETECTED NOT DETECTED Final   Bordetella pertussis NOT DETECTED NOT DETECTED Final   Bordetella Parapertussis NOT DETECTED NOT DETECTED Final   Chlamydophila pneumoniae NOT DETECTED NOT DETECTED Final   Mycoplasma pneumoniae NOT DETECTED NOT DETECTED Final    Comment: Performed at [facility], [address] [place], [place] [address]    Labs: CBC: Recent Labs  Lab 02/27/24 [id number] 02/28/24 0534 03/01/24 0412 03/02/24 0525 03/03/24 0408  WBC 10.4 12.3* 12.9* 13.0* 13.5*  NEUTROABS  --   --   --  10.9* 11.9*  HGB 7.9* 8.2* 8.2* 8.7* 8.6*  HCT 26.4* 27.8* 28.0* 29.7* 29.7*  MCV 77.4* 77.9* 80.0 79.2* 79.6*  PLT 385 386 372 400 373   Basic Metabolic Panel: Recent Labs  Lab 02/26/24 0553 02/27/24 0521 03/02/24 0525 03/03/24 0408  NA 135 135 136 128*  K 4.1 3.9 3.7 3.6  CL 100 101 96* 90*  CO2 25 25 28 27   GLUCOSE 131* 110* 184* 201*  BUN 32* 31* 40* 49*  CREATININE 0.98 0.93 1.09* 1.10*  CALCIUM  9.1 8.8* 9.0 8.3*  MG 2.4 2.6*  --   --    Liver Function Tests: Recent Labs  Lab 02/27/24 0521  AST 17  ALT 16  ALKPHOS 80  BILITOT 0.6  PROT 6.6  ALBUMIN 3.5   CBG: No results for input(s): "GLUCAP" in the last 168 hours.  Discharge time spent: greater than 30 minutes.  Signed: Lorita Rosa, MD [facility] 03/03/2024

## 2024-03-07 NOTE — Progress Notes (Unsigned)
 PROVIDER NOTE: Interpretation of information contained herein should be left to medically-trained personnel. Specific patient instructions are provided elsewhere under "Patient Instructions" section of medical record. This document was created in part using AI and STT-dictation technology, any transcriptional errors that may result from this process are unintentional.  Patient: Elizabeth [name]  Service: E/M   PCP: Evelena Hines, FNP (Inactive)  DOB: 1961-03-18  DOS: 03/08/2024  Provider: Cherylin Corrigan, NP  MRN: [id number]  Delivery: Face-to-face  Specialty: Interventional Pain Management  Type: Established Patient  Setting: Ambulatory outpatient facility  Specialty designation: 09  Referring Prov.: No ref. provider found  Location: Outpatient office facility       History of present illness (HPI) Elizabeth [name], a 63 y.o. year old female, is here today because of her No primary diagnosis found.. Elizabeth [name]'s primary complain today is No chief complaint on file.  Pertinent problems: Elizabeth [name] does not have any pertinent problems on file.  Pain Assessment: Severity of   is reported as a  /10. Location:    / . Onset:  . Quality:  . Timing:  . Modifying factor(s):  Aaron Aas Vitals:  vitals were not taken for this visit.  BMI: Estimated body mass index is 37.26 kg/m as calculated from the following:   Height as of 02/24/24: 4\' 9"  (1.448 m).   Weight as of 02/24/24: 172 lb 2.9 oz (78.1 kg).  Last encounter: Visit date not found. Last procedure: Visit date not found.  Reason for encounter: medication management.   Pharmacotherapy Assessment   Analgesic: Hydrocodone -acetaminophen  (Norco/Vicodin) 5-325 mg tablet every 6 hours as needed for severe pain. MME=20 Monitoring: [place] PMP: PDMP reviewed during this encounter.       Pharmacotherapy: No side-effects or adverse reactions reported. Compliance: No problems identified. Effectiveness: Clinically acceptable.  No notes on file  No  results found for: "CBDTHCR" No results found for: "D8THCCBX" No results found for: "D9THCCBX"  UDS:  Summary  Date Value Ref Range Status  03/17/2023 Note  Final    Comment:    ==================================================================== [facility] (MW) ==================================================================== Test                             Result       Flag       Units  Drug Present and Declared for Prescription Verification   Hydrocodone                     6070         EXPECTED   ng/mg creat   Hydromorphone                  848          EXPECTED   ng/mg creat   Dihydrocodeine                 677          EXPECTED   ng/mg creat   Norhydrocodone                 3445         EXPECTED   ng/mg creat    Sources of hydrocodone  include scheduled prescription medications.    Hydromorphone, dihydrocodeine and norhydrocodone are expected    metabolites of hydrocodone . Hydromorphone and dihydrocodeine are    also available as scheduled prescription medications.  Drug Present not Declared for Prescription Verification  Oxazepam                       45           UNEXPECTED ng/mg creat    Oxazepam may be administered as a scheduled prescription medication;    it is also an expected metabolite of other benzodiazepine drugs,    including diazepam, chlordiazepoxide, prazepam, clorazepate,    halazepam, and temazepam.  ==================================================================== Test                      Result    Flag   Units      Ref Range   Creatinine              83               mg/dL      >=14 ==================================================================== Declared Medications:  The flagging and interpretation on this report are based on the  following declared medications.  Unexpected results may arise from  inaccuracies in the declared medications.   **Note: The testing scope of this panel includes these medications:   Hydrocodone   (Norco)   **Note: The testing scope of this panel does not include the  following reported medications:   Acetaminophen  (Norco)  Albuterol  (Ventolin  HFA)  Atorvastatin  (Lipitor)  Carvedilol  (Coreg )  Formoterol  (Dulera )  Mometasone  (Dulera )  Naloxone  (Narcan )  Pantoprazole  (Protonix )  Potassium (Klor-Con )  Spironolactone  (Aldactone )  Torsemide  (Demadex ) ==================================================================== For clinical consultation, please call [phone]. ====================================================================      ROS  Constitutional: Denies any fever or chills Gastrointestinal: No reported hemesis, hematochezia, vomiting, or acute GI distress Musculoskeletal: Denies any acute onset joint swelling, redness, loss of ROM, or weakness Neurological: No reported episodes of acute onset apraxia, aphasia, dysarthria, agnosia, amnesia, paralysis, loss of coordination, or loss of consciousness  Medication Review  HYDROcodone -acetaminophen , albuterol , atorvastatin , busPIRone, carvedilol , cyanocobalamin , iron  polysaccharides, mometasone -formoterol , naloxone , nicotine , pantoprazole , potassium chloride  SA, predniSONE , spironolactone , and torsemide   History Review  Allergy: Elizabeth [name] is allergic to lisinopril. Drug: Elizabeth [name]  reports no history of drug use. Alcohol:  reports no history of alcohol use. Tobacco:  reports that she quit smoking about 5 years ago. Her smoking use included cigarettes. She started smoking about 45 years ago. She has a 60 pack-year smoking history. She has never used smokeless tobacco. Social: Elizabeth [name]  reports that she quit smoking about 5 years ago. Her smoking use included cigarettes. She started smoking about 45 years ago. She has a 60 pack-year smoking history. She has never used smokeless tobacco. She reports that she does not drink alcohol and does not use drugs. Medical:  has a past medical history of Anxiety, Arm pain  (07/25/2015), CHF (congestive heart failure) (HCC), Congestive heart failure (HCC) (07/25/2015), COPD (chronic obstructive pulmonary disease) (HCC), Depression, Hypertension, Lower extremity pain (07/25/2015), Reflux, RSD (reflex sympathetic dystrophy), Spinal cord stimulator status, Vitamin B12 deficiency, and Vitamin D  deficiency. Surgical: Elizabeth [name]  has a past surgical history that includes Spinal cord stimulator implant; Breast biopsy (Right, 05/10/2023); and Breast biopsy (Right, 05/10/2023). Family: family history includes Cancer in her father; Cirrhosis in her mother.  Laboratory Chemistry Profile   Renal Lab Results  Component Value Date   BUN 49 (H) 03/03/2024   CREATININE 1.10 (H) 03/03/2024   BCR 17 08/30/2017   GFRAA >60 08/18/2018   GFRNONAA 56 (L) 03/03/2024    Hepatic Lab Results  Component Value Date   AST 17 02/27/2024  ALT 16 02/27/2024   ALBUMIN 3.5 02/27/2024   ALKPHOS 80 02/27/2024    Electrolytes Lab Results  Component Value Date   NA 128 (L) 03/03/2024   K 3.6 03/03/2024   CL 90 (L) 03/03/2024   CALCIUM  8.3 (L) 03/03/2024   MG 2.6 (H) 02/27/2024   PHOS 3.4 04/20/2021    Bone Lab Results  Component Value Date   VD25OH 25.3 (L) 03/18/2016   VD125OH2TOT 76.2 03/18/2016   25OHVITD1 37 08/30/2017   25OHVITD2 1.0 08/30/2017   25OHVITD3 36 08/30/2017    Inflammation (CRP: Acute Phase) (ESR: Chronic Phase) Lab Results  Component Value Date   CRP 0.6 03/02/2024   ESRSEDRATE 30 08/30/2017         Note: Above Lab results reviewed.  Recent Imaging Review  US  Venous Img Lower Bilateral (DVT) CLINICAL DATA:  Pain in swelling in the legs bilaterally.  EXAM: Bilateral LOWER EXTREMITY VENOUS DOPPLER ULTRASOUND  TECHNIQUE: Gray-scale sonography with compression, as well as color and duplex ultrasound, were performed to evaluate the deep venous system(s) from the level of the common femoral vein through the popliteal and proximal calf  veins.  COMPARISON:  Left lower extremity venous Doppler ultrasound March 16, 2006. Right lower extremity venous Doppler ultrasound August 15, 2010  FINDINGS: VENOUS  Normal compressibility of the common femoral, superficial femoral, and popliteal veins, as well as the visualized calf veins. Visualized portions of profunda femoral vein and great saphenous vein unremarkable. No filling defects to suggest DVT on grayscale or color Doppler imaging. Doppler waveforms show normal direction of venous flow, normal respiratory plasticity and response to augmentation.  Limited views of the contralateral common femoral vein are unremarkable.  OTHER  None.  Limitations: none  IMPRESSION: No DVT bilateral lower extremities.  Electronically Signed   By: Susan Ensign   On: 02/25/2024 07:55 Note: Reviewed         Physical Exam  General appearance: Well nourished, well developed, and well hydrated. In no apparent acute distress Mental status: Alert, oriented x 3 (person, place, & time)       Respiratory: No evidence of acute respiratory distress Eyes: PERLA Vitals: There were no vitals taken for this visit. BMI: Estimated body mass index is 37.26 kg/m as calculated from the following:   Height as of 02/24/24: 4\' 9"  (1.448 m).   Weight as of 02/24/24: 172 lb 2.9 oz (78.1 kg). Ideal: Patient must be at least 60 in tall to calculate ideal body weight  Assessment   Diagnosis Status  1. Chronic lower extremity pain (1ry area of Pain) (Bilateral)   2. Chronic low back pain (Bilateral) w/o sciatica   3. Lumbar spondylosis   4. Lumbar facet syndrome (Bilateral)   5. Complex regional pain syndrome type 1 of lower extremities (Bilateral)   6. Complex regional pain syndrome type 1 of upper extremities (Bilateral)   7. Chronic pain syndrome   8. Pharmacologic therapy   9. Chronic use of opiate for therapeutic purpose   10. Encounter for medication management   11. Encounter for  chronic pain management    Controlled Controlled Controlled   Updated Problems: No problems updated.  Plan of Care  Problem-specific:  Assessment and Plan            Elizabeth [name] has a current medication list which includes the following long-term medication(s): albuterol , albuterol , atorvastatin , carvedilol , hydrocodone -acetaminophen , iron  polysaccharides, mometasone -formoterol , and torsemide .  Pharmacotherapy (Medications Ordered): No orders of the defined types were  placed in this encounter.  Orders:  No orders of the defined types were placed in this encounter.    {There is no content from the last Plan section.}   No follow-ups on file.    Recent Visits Date Type Provider Dept  12/13/23 Office Visit Renaldo Caroli, MD [facility]  Showing recent visits within past 90 days and meeting all other requirements Future Appointments Date Type Provider Dept  03/08/24 Appointment Tyshaun Vinzant K, NP [facility]  Showing future appointments within next 90 days and meeting all other requirements  I discussed the assessment and treatment plan with the patient. The patient was provided an opportunity to ask questions and all were answered. The patient agreed with the plan and demonstrated an understanding of the instructions.  Patient advised to call back or seek an in-person evaluation if the symptoms or condition worsens.  Duration of encounter: *** minutes.  Total time on encounter, as per AMA guidelines included both the face-to-face and non-face-to-face time personally spent by the physician and/or other qualified health care professional(s) on the day of the encounter (includes time in activities that require the physician or other qualified health care professional and does not include time in activities normally performed by clinical staff). Physician's time may include the following activities when performed: Preparing to see the  patient (e.g., pre-charting review of records, searching for previously ordered imaging, lab work, and nerve conduction tests) Review of prior analgesic pharmacotherapies. Reviewing PMP Interpreting ordered tests (e.g., lab work, imaging, nerve conduction tests) Performing post-procedure evaluations, including interpretation of diagnostic procedures Obtaining and/or reviewing separately obtained history Performing a medically appropriate examination and/or evaluation Counseling and educating the patient/family/caregiver Ordering medications, tests, or procedures Referring and communicating with other health care professionals (when not separately reported) Documenting clinical information in the electronic or other health record Independently interpreting results (not separately reported) and communicating results to the patient/ family/caregiver Care coordination (not separately reported)  Note by: Mylei Brackeen K Alen Matheson, NP (TTS and AI technology used. I apologize for any typographical errors that were not detected and corrected.) Date: 03/08/2024; Time: 3:46 PM

## 2024-03-08 ENCOUNTER — Ambulatory Visit: Attending: Pain Medicine | Admitting: Nurse Practitioner

## 2024-03-08 ENCOUNTER — Encounter: Payer: Self-pay | Admitting: Nurse Practitioner

## 2024-03-08 DIAGNOSIS — G894 Chronic pain syndrome: Secondary | ICD-10-CM

## 2024-03-08 DIAGNOSIS — G90523 Complex regional pain syndrome I of lower limb, bilateral: Secondary | ICD-10-CM | POA: Diagnosis not present

## 2024-03-08 DIAGNOSIS — M79605 Pain in left leg: Secondary | ICD-10-CM | POA: Insufficient documentation

## 2024-03-08 DIAGNOSIS — M47816 Spondylosis without myelopathy or radiculopathy, lumbar region: Secondary | ICD-10-CM | POA: Diagnosis not present

## 2024-03-08 DIAGNOSIS — Z79891 Long term (current) use of opiate analgesic: Secondary | ICD-10-CM | POA: Diagnosis present

## 2024-03-08 DIAGNOSIS — M79604 Pain in right leg: Secondary | ICD-10-CM | POA: Insufficient documentation

## 2024-03-08 DIAGNOSIS — Z79899 Other long term (current) drug therapy: Secondary | ICD-10-CM | POA: Diagnosis present

## 2024-03-08 DIAGNOSIS — G8929 Other chronic pain: Secondary | ICD-10-CM

## 2024-03-08 DIAGNOSIS — M545 Low back pain, unspecified: Secondary | ICD-10-CM

## 2024-03-08 DIAGNOSIS — G90513 Complex regional pain syndrome I of upper limb, bilateral: Secondary | ICD-10-CM | POA: Diagnosis present

## 2024-03-08 MED ORDER — HYDROCODONE-ACETAMINOPHEN 5-325 MG PO TABS: 1.0000 | ORAL_TABLET | Freq: Four times a day (QID) | ORAL | 0 refills | Status: DC | PRN

## 2024-03-08 NOTE — Progress Notes (Signed)
 Nursing Pain Medication Assessment:  Safety precautions to be maintained throughout the outpatient stay will include: orient to surroundings, keep bed in low position, maintain call bell within reach at all times, provide assistance with transfer out of bed and ambulation.  Medication Inspection Compliance: Pill count conducted under aseptic conditions, in front of the patient. Neither the pills nor the bottle was removed from the patient's sight at any time. Once count was completed pills were immediately returned to the patient in their original bottle.  Medication: Hydrocodone /APAP Pill/Patch Count: 26 of 120 pills/patches remain Pill/Patch Appearance: Markings consistent with prescribed medication Bottle Appearance: Standard pharmacy container. Clearly labeled. Filled Date: 5 / 5 / 2025 Last Medication intake:  Today

## 2024-03-11 LAB — TOXASSURE SELECT 13 (MW), URINE

## 2024-04-06 ENCOUNTER — Ambulatory Visit (INDEPENDENT_AMBULATORY_CARE_PROVIDER_SITE_OTHER): Admitting: Nurse Practitioner

## 2024-04-06 ENCOUNTER — Encounter (INDEPENDENT_AMBULATORY_CARE_PROVIDER_SITE_OTHER): Payer: Self-pay | Admitting: Nurse Practitioner

## 2024-04-06 VITALS — BP 124/70 | HR 77 | Ht <= 58 in

## 2024-04-06 DIAGNOSIS — E785 Hyperlipidemia, unspecified: Secondary | ICD-10-CM

## 2024-04-06 DIAGNOSIS — I890 Lymphedema, not elsewhere classified: Secondary | ICD-10-CM

## 2024-04-06 DIAGNOSIS — L03119 Cellulitis of unspecified part of limb: Secondary | ICD-10-CM

## 2024-04-06 MED ORDER — SULFAMETHOXAZOLE-TRIMETHOPRIM 800-160 MG PO TABS: 1.0000 | ORAL_TABLET | Freq: Two times a day (BID) | ORAL | 0 refills | Status: DC

## 2024-04-06 NOTE — Progress Notes (Signed)
 Subjective:    [name], female    DOB: 09-Jan-1961, 63 y.o.   MRN: [id number] Chief Complaint  Patient presents with   np. consult. lymphedema. pneumatic compression device.    The patient is a 63 year old female who presents as a referral from Dr. Florencio in regards to lower extremity lymphedema.  She has had swelling for years and has utilized medical grade compression but has noted that and has not had any significant improvement in her swelling.  Over the last several weeks she notes that her swelling has gotten much worse and she has developed little blisters that are weeping with significant redness in the area.  Currently she denies any chest pain or fevers or chills.  She also elevates her lower extremities as possible.  She does have a significant history of lower back issues and so ambulating on a significant basis is difficult for her.    Review of Systems  Cardiovascular:  Positive for leg swelling.  Skin:  Positive for wound.  Neurological:  Positive for weakness.  All other systems reviewed and are negative.      Objective:   Physical Exam Vitals reviewed.  HENT:     Head: Normocephalic.   Cardiovascular:     Rate and Rhythm: Normal rate.  Pulmonary:     Effort: Pulmonary effort is normal.   Musculoskeletal:     Right lower leg: 3+ Pitting Edema present.     Left lower leg: 3+ Pitting Edema present.   Skin:    General: Skin is warm and dry.   Neurological:     Mental Status: She is alert and oriented to person, place, and time.   Psychiatric:        Mood and Affect: Mood normal.        Behavior: Behavior normal.        Thought Content: Thought content normal.        Judgment: Judgment normal.     BP 124/70   Pulse 77   Ht 4' 9 (1.448 m)   BMI 30.30 kg/m   Past Medical History:  Diagnosis Date   Anxiety    Arm pain 07/25/2015   CHF (congestive heart failure) (HCC)    Congestive heart failure (HCC) 07/25/2015   COPD  (chronic obstructive pulmonary disease) (HCC)    never been idagnosed   Depression    Hypertension    Lower extremity pain 07/25/2015   Reflux    RSD (reflex sympathetic dystrophy)    Spinal cord stimulator status    Vitamin B12 deficiency    Vitamin D  deficiency     Social History   Socioeconomic History   Marital status: Divorced    Spouse name: Not on file   Number of children: Not on file   Years of education: Not on file   Highest education level: Not on file  Occupational History   Not on file  Tobacco Use   Smoking status: Former    Current packs/day: 0.00    Average packs/day: 1.5 packs/day for 40.0 years (60.0 ttl pk-yrs)    Types: Cigarettes    Start date: 04/11/1978    Quit date: 04/11/2018    Years since quitting: 5.9   Smokeless tobacco: Never  Vaping Use   Vaping status: Never Used  Substance and Sexual Activity   Alcohol use: No    Alcohol/week: 0.0 standard drinks of alcohol   Drug use: No   Sexual activity: Yes  Other  Topics Concern   Not on file  Social History Narrative   Not on file   Social Drivers of Health   Financial Resource Strain: Low Risk  (02/24/2024)   Received from [facility]   Overall Financial Resource Strain (CARDIA)    Difficulty of Paying Living Expenses: Not very hard  Food Insecurity: No Food Insecurity (02/24/2024)   Hunger Vital Sign    Worried About Running Out of Food in the Last Year: Never true    Ran Out of Food in the Last Year: Never true  Transportation Needs: No Transportation Needs (02/24/2024)   PRAPARE - Administrator, Civil Service (Medical): No    Lack of Transportation (Non-Medical): No  Physical Activity: Unknown (08/14/2018)   Exercise Vital Sign    Days of Exercise per Week: Patient declined    Minutes of Exercise per Session: Patient declined  Stress: Unknown (08/14/2018)   [employer] of Occupational Health - Occupational Stress Questionnaire    Feeling of Stress :  Patient declined  Social Connections: Unknown (08/14/2018)   Social Connection and Isolation Panel    Frequency of Communication with Friends and Family: Patient declined    Frequency of Social Gatherings with Friends and Family: Patient declined    Attends Religious Services: Patient declined    Active Member of Clubs or Organizations: Patient declined    Attends Banker Meetings: Patient declined    Marital Status: Patient declined  Intimate Partner Violence: Not At Risk (02/24/2024)   Humiliation, Afraid, Rape, and Kick questionnaire    Fear of Current or Ex-Partner: No    Emotionally Abused: No    Physically Abused: No    Sexually Abused: No    Past Surgical History:  Procedure Laterality Date   BREAST BIOPSY Right 05/10/2023   us  bx/ venus clip/ path pending   BREAST BIOPSY Right 05/10/2023   US  RT BREAST BX W LOC DEV 1ST LESION IMG BX SPEC US  GUIDE 05/10/2023 [facility]   SPINAL CORD STIMULATOR IMPLANT     x 2    Family History  Problem Relation Age of Onset   Cirrhosis Mother    Cancer Father     Allergies  Allergen Reactions   Lisinopril Anaphylaxis    Other Reaction(s): edema of face and throat       Latest Ref Rng & Units 03/03/2024    4:08 AM 03/02/2024    5:25 AM 03/01/2024    4:12 AM  CBC  WBC 4.0 - 10.5 K/uL 13.5  13.0  12.9   Hemoglobin 12.0 - 15.0 g/dL 8.6  8.7  8.2   Hematocrit 36.0 - 46.0 % 29.7  29.7  28.0   Platelets 150 - 400 K/uL 373  400  372       CMP     Component Value Date/Time   NA 128 (L) 03/03/2024 0408   NA 144 08/30/2017 0959   NA 139 03/08/2014 0811   K 3.6 03/03/2024 0408   K 4.0 03/08/2014 0811   CL 90 (L) 03/03/2024 0408   CL 103 03/08/2014 0811   CO2 27 03/03/2024 0408   CO2 29 03/08/2014 0811   GLUCOSE 201 (H) 03/03/2024 0408   GLUCOSE 98 03/08/2014 0811   BUN 49 (H) 03/03/2024 0408   BUN 12 08/30/2017 0959   BUN 10 03/08/2014 0811   CREATININE 1.10 (H) 03/03/2024 0408   CREATININE 0.77 03/08/2014  0811   CALCIUM  8.3 (L) 03/03/2024 0408  CALCIUM  8.9 03/08/2014 0811   PROT 6.6 02/27/2024 0521   PROT 7.0 08/30/2017 0959   PROT 6.4 03/08/2014 0811   ALBUMIN 3.5 02/27/2024 0521   ALBUMIN 4.3 08/30/2017 0959   ALBUMIN 3.1 (L) 03/08/2014 0811   AST 17 02/27/2024 0521   AST 10 (L) 03/08/2014 0811   ALT 16 02/27/2024 0521   ALT 9 (L) 03/08/2014 0811   ALKPHOS 80 02/27/2024 0521   ALKPHOS 85 03/08/2014 0811   BILITOT 0.6 02/27/2024 0521   BILITOT 0.3 08/30/2017 0959   BILITOT 0.7 03/08/2014 0811   GFRNONAA 56 (L) 03/03/2024 0408   GFRNONAA >60 03/08/2014 0811     No results found.     Assessment & Plan:   1. Lymphedema (Primary) Recommend:  No surgery or intervention at this point in time.   The Patient is CEAP [id number].  The patient has been wearing compression for more than 12 weeks with no or little benefit.  The patient has been exercising daily for more than 12 weeks. The patient has been elevating and taking OTC pain medications for more than 12 weeks.  None of these have have eliminated the pain related to the lymphedema or the discomfort regarding excessive swelling and venous congestion.    I have reviewed my discussion with the patient regarding lymphedema and why it  causes symptoms.  Patient will continue wearing graduated compression on a daily basis. The patient should put the compression on first thing in the morning and removing them in the evening. The patient should not sleep in the compression.   In addition, behavioral modification throughout the day will be continued.  This will include frequent elevation (such as in a recliner), use of over the counter pain medications as needed and exercise such as walking.  The systemic causes for chronic edema such as liver, kidney and cardiac etiologies do not appear to have significant changed over the past year.    The patient has chronic , severe lymphedema with hyperpigmentation of the skin and has done MLD, skin  care, medication, diet, exercise, elevation and compression for 4 weeks with no improvement,  I am recommending a lymphedema pump.  The patient still has stage 3 lymphedema and therefore, I believe that a lymph pump is needed to improve the control of the patient's lymphedema and improve the quality of life.  Additionally, a lymph pump is warranted because it will reduce the risk of cellulitis and ulceration in the future.  Patient should follow-up in six months   2. Cellulitis of lower extremity, unspecified laterality Today the patient has some evidence of wounds and cellulitis.  We have sent in Bactrim for the patient.  I have had a long discussion with the patient regarding venous insufficiency and why it  causes symptoms, specifically venous ulceration. I have discussed with the patient the chronic skin changes that accompany venous insufficiency and the long term sequela such as infection and recurring  ulceration.  Patient will be placed in [employer] which will be changed weekly drainage permitting.  In addition, behavioral modification including several periods of elevation of the lower extremities during the day will be continued. Achieving a position with the ankles at heart level was stressed to the patient  The patient is instructed to begin routine exercise, especially walking on a daily basis  In the interim we will begin working on a lymphedema pump for the patient as noted below.  3. Hyperlipidemia, unspecified hyperlipidemia type Continue statin as ordered and  reviewed, no changes at this time   Current Outpatient Medications on File Prior to Visit  Medication Sig Dispense Refill   albuterol  (PROVENTIL  HFA;VENTOLIN  HFA) 108 (90 Base) MCG/ACT inhaler Inhale 2 puffs into the lungs every 6 (six) hours as needed for wheezing or shortness of breath. 1 Inhaler 2   albuterol  (PROVENTIL ) (2.5 MG/3ML) 0.083% nebulizer solution Take 3 mLs (2.5 mg total) by nebulization every 4 (four)  hours as needed for shortness of breath or wheezing. 540 mL 0   atorvastatin  (LIPITOR) 40 MG tablet Take 1 tablet (40 mg total) by mouth at bedtime. 30 tablet 2   busPIRone  (BUSPAR ) 5 MG tablet Take 1 tablet (5 mg total) by mouth 2 (two) times daily as needed (anxiety). 60 tablet 0   carvedilol  (COREG ) 3.125 MG tablet Take 1 tablet (3.125 mg total) by mouth 2 (two) times daily with a meal. 60 tablet 1   cyanocobalamin  1000 MCG tablet Take 1 tablet (1,000 mcg total) by mouth daily. 30 tablet 0   HYDROcodone -acetaminophen  (NORCO/VICODIN) 5-325 MG tablet Take 1 tablet by mouth every 6 (six) hours as needed for severe pain (pain score 7-10). Must last 30 days. 120 tablet 0   [START ON 04/14/2024] HYDROcodone -acetaminophen  (NORCO/VICODIN) 5-325 MG tablet Take 1 tablet by mouth every 6 (six) hours as needed for severe pain (pain score 7-10). Must last 30 days. 120 tablet 0   [START ON 05/14/2024] HYDROcodone -acetaminophen  (NORCO/VICODIN) 5-325 MG tablet Take 1 tablet by mouth every 6 (six) hours as needed for severe pain (pain score 7-10). Must last 30 days. 120 tablet 0   iron  polysaccharides (NIFEREX) 150 MG capsule Take 1 capsule (150 mg total) by mouth daily. 30 capsule 0   mometasone -formoterol  (DULERA ) 100-5 MCG/ACT AERO Inhale 2 puffs into the lungs 2 (two) times daily. 1 Inhaler 2   nicotine  (NICODERM CQ  - DOSED IN MG/24 HOURS) 14 mg/24hr patch Place 1 patch (14 mg total) onto the skin daily as needed (nicotine  craving). 28 patch 0   pantoprazole  (PROTONIX ) 40 MG tablet Take 40 mg by mouth daily.     potassium chloride  SA (KLOR-CON  M) 20 MEQ tablet Take 20 mEq by mouth daily.     spironolactone  (ALDACTONE ) 25 MG tablet Take 25 mg by mouth daily.     torsemide  (DEMADEX ) 20 MG tablet Take 1 tablet (20 mg total) by mouth daily. (Patient taking differently: Take 100 mg by mouth daily.) 30 tablet 0   No current facility-administered medications on file prior to visit.    There are no Patient Instructions  on file for this visit. No follow-ups on file.   Bryceton Hantz E Adaleah Forget, NP

## 2024-04-13 ENCOUNTER — Encounter (INDEPENDENT_AMBULATORY_CARE_PROVIDER_SITE_OTHER): Payer: Self-pay

## 2024-04-13 ENCOUNTER — Ambulatory Visit (INDEPENDENT_AMBULATORY_CARE_PROVIDER_SITE_OTHER): Admitting: Nurse Practitioner

## 2024-04-13 VITALS — BP 115/71 | HR 96 | Resp 16

## 2024-04-13 DIAGNOSIS — I890 Lymphedema, not elsewhere classified: Secondary | ICD-10-CM

## 2024-04-13 DIAGNOSIS — L03119 Cellulitis of unspecified part of limb: Secondary | ICD-10-CM

## 2024-04-13 NOTE — Progress Notes (Signed)
 History of Present Illness  There is no documented history at this time  Assessments & Plan   There are no diagnoses linked to this encounter.    Additional instructions  Subjective:  Patient presents with venous ulcer of the Bilateral lower extremity.    Procedure:  3 layer unna wrap was placed Bilateral lower extremity.   Plan:   Follow up in one week.

## 2024-04-18 ENCOUNTER — Other Ambulatory Visit: Payer: Self-pay

## 2024-04-18 DIAGNOSIS — Z87891 Personal history of nicotine dependence: Secondary | ICD-10-CM

## 2024-04-18 DIAGNOSIS — Z122 Encounter for screening for malignant neoplasm of respiratory organs: Secondary | ICD-10-CM

## 2024-04-20 ENCOUNTER — Encounter (INDEPENDENT_AMBULATORY_CARE_PROVIDER_SITE_OTHER)

## 2024-04-26 ENCOUNTER — Telehealth (INDEPENDENT_AMBULATORY_CARE_PROVIDER_SITE_OTHER): Payer: Self-pay

## 2024-04-26 NOTE — Telephone Encounter (Signed)
 Patient reach out to the office on yesterday concern with unna wraps had not been changed since her last office visit. [facility] reach out to the patient to schedule start of care on yesterday but patient prefer to moved out initial date. I spoke with the patient this morning and she informed that home health has reach out and will coming out to change unna wraps.

## 2024-04-27 ENCOUNTER — Encounter (INDEPENDENT_AMBULATORY_CARE_PROVIDER_SITE_OTHER)

## 2024-05-04 ENCOUNTER — Encounter (INDEPENDENT_AMBULATORY_CARE_PROVIDER_SITE_OTHER)

## 2024-05-11 ENCOUNTER — Ambulatory Visit (INDEPENDENT_AMBULATORY_CARE_PROVIDER_SITE_OTHER): Admitting: Nurse Practitioner

## 2024-05-18 ENCOUNTER — Ambulatory Visit (INDEPENDENT_AMBULATORY_CARE_PROVIDER_SITE_OTHER): Admitting: Nurse Practitioner

## 2024-05-18 ENCOUNTER — Encounter (INDEPENDENT_AMBULATORY_CARE_PROVIDER_SITE_OTHER): Payer: Self-pay | Admitting: Nurse Practitioner

## 2024-05-18 VITALS — BP 127/82 | HR 71 | Resp 18

## 2024-05-18 DIAGNOSIS — L03119 Cellulitis of unspecified part of limb: Secondary | ICD-10-CM | POA: Diagnosis not present

## 2024-05-18 DIAGNOSIS — I890 Lymphedema, not elsewhere classified: Secondary | ICD-10-CM | POA: Diagnosis not present

## 2024-05-18 DIAGNOSIS — I10 Essential (primary) hypertension: Secondary | ICD-10-CM | POA: Diagnosis not present

## 2024-05-21 ENCOUNTER — Encounter (INDEPENDENT_AMBULATORY_CARE_PROVIDER_SITE_OTHER): Payer: Self-pay | Admitting: Nurse Practitioner

## 2024-05-21 NOTE — Progress Notes (Signed)
 Subjective:    [name], female    DOB: [birth year], 64 y.o.   MRN: [id number] Chief Complaint  Patient presents with   Follow-up    Unna wrap follow up    The patient is a 63 year old female who presents as a referral from Dr. Florencio in regards to lower extremity lymphedema.  Today her swelling is much better and her wounds have completely healed.  She continues to try to elevate her lower extremities is much as possible.  She has tolerated the Unna boots very well. Follow-up she does have a significant history of lower back issues and so ambulating on a significant basis is difficult for her.    Review of Systems  Cardiovascular:  Positive for leg swelling.  Skin:  Positive for wound.  Neurological:  Positive for weakness.  All other systems reviewed and are negative.      Objective:   Physical Exam Vitals reviewed.  HENT:     Head: Normocephalic.  Cardiovascular:     Rate and Rhythm: Normal rate.  Pulmonary:     Effort: Pulmonary effort is normal.  Musculoskeletal:     Right lower leg: 3+ Pitting Edema present.     Left lower leg: 3+ Pitting Edema present.  Skin:    General: Skin is warm and dry.  Neurological:     Mental Status: She is alert and oriented to person, place, and time.  Psychiatric:        Mood and Affect: Mood normal.        Behavior: Behavior normal.        Thought Content: Thought content normal.        Judgment: Judgment normal.     BP 127/82   Pulse 71   Resp 18   Past Medical History:  Diagnosis Date   Anxiety    Arm pain 07/25/2015   CHF (congestive heart failure) (HCC)    Congestive heart failure (HCC) 07/25/2015   COPD (chronic obstructive pulmonary disease) (HCC)    never been idagnosed   Depression    Hypertension    Lower extremity pain 07/25/2015   Reflux    RSD (reflex sympathetic dystrophy)    Spinal cord stimulator status    Vitamin B12 deficiency    Vitamin D  deficiency     Social History    Socioeconomic History   Marital status: Divorced    Spouse name: Not on file   Number of children: Not on file   Years of education: Not on file   Highest education level: Not on file  Occupational History   Not on file  Tobacco Use   Smoking status: Former    Current packs/day: 0.00    Average packs/day: 1.5 packs/day for 40.0 years (60.0 ttl pk-yrs)    Types: Cigarettes    Start date: 04/11/1978    Quit date: 04/11/2018    Years since quitting: 6.1   Smokeless tobacco: Never  Vaping Use   Vaping status: Never Used  Substance and Sexual Activity   Alcohol use: No    Alcohol/week: 0.0 standard drinks of alcohol   Drug use: No   Sexual activity: Yes  Other Topics Concern   Not on file  Social History Narrative   Not on file   Social Drivers of Health   Financial Resource Strain: Low Risk  (02/24/2024)   Received from [facility]   Overall Financial Resource Strain (CARDIA)    Difficulty of Paying  Living Expenses: Not very hard  Food Insecurity: No Food Insecurity (02/24/2024)   Hunger Vital Sign    Worried About Running Out of Food in the Last Year: Never true    Ran Out of Food in the Last Year: Never true  Transportation Needs: No Transportation Needs (02/24/2024)   PRAPARE - Administrator, Civil Service (Medical): No    Lack of Transportation (Non-Medical): No  Physical Activity: Unknown (08/14/2018)   Exercise Vital Sign    Days of Exercise per Week: Patient declined    Minutes of Exercise per Session: Patient declined  Stress: Unknown (08/14/2018)   [employer] of Occupational Health - Occupational Stress Questionnaire    Feeling of Stress : Patient declined  Social Connections: Unknown (08/14/2018)   Social Connection and Isolation Panel    Frequency of Communication with Friends and Family: Patient declined    Frequency of Social Gatherings with Friends and Family: Patient declined    Attends Religious Services: Patient  declined    Active Member of Clubs or Organizations: Patient declined    Attends Banker Meetings: Patient declined    Marital Status: Patient declined  Intimate Partner Violence: Not At Risk (02/24/2024)   Humiliation, Afraid, Rape, and Kick questionnaire    Fear of Current or Ex-Partner: No    Emotionally Abused: No    Physically Abused: No    Sexually Abused: No    Past Surgical History:  Procedure Laterality Date   BREAST BIOPSY Right 05/10/2023   us  bx/ venus clip/ path pending   BREAST BIOPSY Right 05/10/2023   US  RT BREAST BX W LOC DEV 1ST LESION IMG BX SPEC US  GUIDE 05/10/2023 [facility]   SPINAL CORD STIMULATOR IMPLANT     x 2    Family History  Problem Relation Age of Onset   Cirrhosis Mother    Cancer Father     Allergies  Allergen Reactions   Lisinopril Anaphylaxis    Other Reaction(s): edema of face and throat       Latest Ref Rng & Units 03/03/2024    4:08 AM 03/02/2024    5:25 AM 03/01/2024    4:12 AM  CBC  WBC 4.0 - 10.5 K/uL 13.5  13.0  12.9   Hemoglobin 12.0 - 15.0 g/dL 8.6  8.7  8.2   Hematocrit 36.0 - 46.0 % 29.7  29.7  28.0   Platelets 150 - 400 K/uL 373  400  372       CMP     Component Value Date/Time   NA 128 (L) 03/03/2024 0408   NA 144 08/30/2017 0959   NA 139 03/08/2014 0811   K 3.6 03/03/2024 0408   K 4.0 03/08/2014 0811   CL 90 (L) 03/03/2024 0408   CL 103 03/08/2014 0811   CO2 27 03/03/2024 0408   CO2 29 03/08/2014 0811   GLUCOSE 201 (H) 03/03/2024 0408   GLUCOSE 98 03/08/2014 0811   BUN 49 (H) 03/03/2024 0408   BUN 12 08/30/2017 0959   BUN 10 03/08/2014 0811   CREATININE 1.10 (H) 03/03/2024 0408   CREATININE 0.77 03/08/2014 0811   CALCIUM  8.3 (L) 03/03/2024 0408   CALCIUM  8.9 03/08/2014 0811   PROT 6.6 02/27/2024 0521   PROT 7.0 08/30/2017 0959   PROT 6.4 03/08/2014 0811   ALBUMIN 3.5 02/27/2024 0521   ALBUMIN 4.3 08/30/2017 0959   ALBUMIN 3.1 (L) 03/08/2014 0811   AST 17 02/27/2024 0521   AST 10  (  L) 03/08/2014 0811   ALT 16 02/27/2024 0521   ALT 9 (L) 03/08/2014 0811   ALKPHOS 80 02/27/2024 0521   ALKPHOS 85 03/08/2014 0811   BILITOT 0.6 02/27/2024 0521   BILITOT 0.3 08/30/2017 0959   BILITOT 0.7 03/08/2014 0811   GFRNONAA 56 (L) 03/03/2024 0408   GFRNONAA >60 03/08/2014 0811     No results found.     Assessment & Plan:   1. Lymphedema (Primary) Recommend:  No surgery or intervention at this point in time.   The Patient is CEAP C4sEpAsPr.  The patient has been wearing compression for more than 12 weeks with no or little benefit.  The patient has been exercising daily for more than 12 weeks. The patient has been elevating and taking OTC pain medications for more than 12 weeks.  None of these have have eliminated the pain related to the lymphedema or the discomfort regarding excessive swelling and venous congestion.    I have reviewed my discussion with the patient regarding lymphedema and why it  causes symptoms.  Patient will continue wearing graduated compression on a daily basis. The patient should put the compression on first thing in the morning and removing them in the evening. The patient should not sleep in the compression.   In addition, behavioral modification throughout the day will be continued.  This will include frequent elevation (such as in a recliner), use of over the counter pain medications as needed and exercise such as walking.  The systemic causes for chronic edema such as liver, kidney and cardiac etiologies do not appear to have significant changed over the past year.    The patient has chronic , severe lymphedema with hyperpigmentation of the skin and has done MLD, skin care, medication, diet, exercise, elevation and compression for 4 weeks with no improvement,  I am recommending a lymphedema pump.  The patient still has stage 3 lymphedema and therefore, I believe that a lymph pump is needed to improve the control of the patient's lymphedema and  improve the quality of life.  Additionally, a lymph pump is warranted because it will reduce the risk of cellulitis and ulceration in the future.  Patient should follow-up in six months   2. Cellulitis of lower extremity, unspecified laterality This has resolved  3. Hyperlipidemia, unspecified hyperlipidemia type Continue statin as ordered and reviewed, no changes at this time   Current Outpatient Medications on File Prior to Visit  Medication Sig Dispense Refill   albuterol  (PROVENTIL  HFA;VENTOLIN  HFA) 108 (90 Base) MCG/ACT inhaler Inhale 2 puffs into the lungs every 6 (six) hours as needed for wheezing or shortness of breath. 1 Inhaler 2   albuterol  (PROVENTIL ) (2.5 MG/3ML) 0.083% nebulizer solution Take 3 mLs (2.5 mg total) by nebulization every 4 (four) hours as needed for shortness of breath or wheezing. 540 mL 0   atorvastatin  (LIPITOR) 40 MG tablet Take 1 tablet (40 mg total) by mouth at bedtime. 30 tablet 2   busPIRone  (BUSPAR ) 5 MG tablet Take 1 tablet (5 mg total) by mouth 2 (two) times daily as needed (anxiety). 60 tablet 0   carvedilol  (COREG ) 3.125 MG tablet Take 1 tablet (3.125 mg total) by mouth 2 (two) times daily with a meal. 60 tablet 1   cyanocobalamin  1000 MCG tablet Take 1 tablet (1,000 mcg total) by mouth daily. 30 tablet 0   HYDROcodone -acetaminophen  (NORCO/VICODIN) 5-325 MG tablet Take 1 tablet by mouth every 6 (six) hours as needed for severe pain (pain score 7-10).  Must last 30 days. 120 tablet 0   HYDROcodone -acetaminophen  (NORCO/VICODIN) 5-325 MG tablet Take 1 tablet by mouth every 6 (six) hours as needed for severe pain (pain score 7-10). Must last 30 days. 120 tablet 0   HYDROcodone -acetaminophen  (NORCO/VICODIN) 5-325 MG tablet Take 1 tablet by mouth every 6 (six) hours as needed for severe pain (pain score 7-10). Must last 30 days. 120 tablet 0   iron  polysaccharides (NIFEREX) 150 MG capsule Take 1 capsule (150 mg total) by mouth daily. 30 capsule 0    mometasone -formoterol  (DULERA ) 100-5 MCG/ACT AERO Inhale 2 puffs into the lungs 2 (two) times daily. 1 Inhaler 2   nicotine  (NICODERM CQ  - DOSED IN MG/24 HOURS) 14 mg/24hr patch Place 1 patch (14 mg total) onto the skin daily as needed (nicotine  craving). 28 patch 0   pantoprazole  (PROTONIX ) 40 MG tablet Take 40 mg by mouth daily.     potassium chloride  SA (KLOR-CON  M) 20 MEQ tablet Take 20 mEq by mouth daily.     spironolactone  (ALDACTONE ) 25 MG tablet Take 25 mg by mouth daily.     sulfamethoxazole -trimethoprim  (BACTRIM  DS) 800-160 MG tablet Take 1 tablet by mouth 2 (two) times daily. 20 tablet 0   torsemide  (DEMADEX ) 20 MG tablet Take 1 tablet (20 mg total) by mouth daily. (Patient taking differently: Take 100 mg by mouth daily.) 30 tablet 0   No current facility-administered medications on file prior to visit.    There are no Patient Instructions on file for this visit. No follow-ups on file.   Kree Armato E Chancey Ringel, NP

## 2024-05-29 ENCOUNTER — Telehealth: Payer: Self-pay | Admitting: Nurse Practitioner

## 2024-05-29 NOTE — Telephone Encounter (Signed)
 She is approved through 12/25. Per message on her chart.

## 2024-05-29 NOTE — Telephone Encounter (Signed)
 Patient needs PA for her meds

## 2024-06-08 ENCOUNTER — Encounter: Payer: Self-pay | Admitting: Nurse Practitioner

## 2024-06-08 ENCOUNTER — Ambulatory Visit: Attending: Nurse Practitioner | Admitting: Nurse Practitioner

## 2024-06-08 DIAGNOSIS — M79605 Pain in left leg: Secondary | ICD-10-CM | POA: Diagnosis present

## 2024-06-08 DIAGNOSIS — Z79891 Long term (current) use of opiate analgesic: Secondary | ICD-10-CM | POA: Diagnosis present

## 2024-06-08 DIAGNOSIS — G90523 Complex regional pain syndrome I of lower limb, bilateral: Secondary | ICD-10-CM | POA: Insufficient documentation

## 2024-06-08 DIAGNOSIS — G90513 Complex regional pain syndrome I of upper limb, bilateral: Secondary | ICD-10-CM | POA: Diagnosis present

## 2024-06-08 DIAGNOSIS — Z79899 Other long term (current) drug therapy: Secondary | ICD-10-CM | POA: Diagnosis present

## 2024-06-08 DIAGNOSIS — M545 Low back pain, unspecified: Secondary | ICD-10-CM | POA: Insufficient documentation

## 2024-06-08 DIAGNOSIS — M47816 Spondylosis without myelopathy or radiculopathy, lumbar region: Secondary | ICD-10-CM | POA: Insufficient documentation

## 2024-06-08 DIAGNOSIS — G8929 Other chronic pain: Secondary | ICD-10-CM | POA: Diagnosis present

## 2024-06-08 DIAGNOSIS — G894 Chronic pain syndrome: Secondary | ICD-10-CM | POA: Insufficient documentation

## 2024-06-08 DIAGNOSIS — M79604 Pain in right leg: Secondary | ICD-10-CM | POA: Diagnosis not present

## 2024-06-08 MED ORDER — HYDROCODONE-ACETAMINOPHEN 5-325 MG PO TABS: 1.0000 | ORAL_TABLET | Freq: Four times a day (QID) | ORAL | 0 refills | Status: DC | PRN

## 2024-06-08 NOTE — Progress Notes (Signed)
 PROVIDER NOTE: Interpretation of information contained herein should be left to medically-trained personnel. Specific patient instructions are provided elsewhere under Patient Instructions section of medical record. This document was created in part using AI and STT-dictation technology, any transcriptional errors that may result from this process are unintentional.  Patient: Elizabeth [name]  Service: E/M   PCP: Elizabeth Therisa BRAVO, FNP (Inactive)  DOB: [birth year]  DOS: 06/08/2024  Provider: Emmy MARLA Blanch, NP  MRN: [id number]  Delivery: Face-to-face  Specialty: Interventional Pain Management  Type: Established Patient  Setting: Ambulatory outpatient facility  Specialty designation: 09  Referring Prov.: No ref. provider found  Location: Outpatient office facility       History of present illness (HPI) [name], a 63 y.o. year old female, is here today because of her No primary diagnosis found.. Elizabeth [name]'s primary complain today is Arm Pain (Right ) and Leg Pain (Right )  Pertinent problems: [name] has Chronic low back pain (bilateral) w/o sciatica; Presence of spinal cord stimulator; Chronic lower extremity pain (1ry area of pain) (Bilateral); Lumbar facet syndrome (Bilateral); Complex regional pain syndrome type I of upper extremities (Bilateral); Complex regional pain syndrome type 1 of lower extremities (Bilateral); Bilateral leg edema; and Chronic pain syndrome on their pertinent problem list  Pain Assessment: Severity of Chronic pain is reported as a 3 /10. Location: Leg Right/Denies. Onset: More than a month ago. Quality: Constant, Nagging. Timing: Constant. Modifying factor(s): Medication. Vitals:  height is 4' 9 (1.448 m) and weight is 150 lb (68 kg). Her temporal temperature is 97.6 F (36.4 C). Her blood pressure is 123/57 (abnormal) and her pulse is 79. Her respiration is 18 and oxygen saturation is 97%.  BMI: Estimated body mass index is 32.46 kg/m as  calculated from the following:   Height as of this encounter: 4' 9 (1.448 m).   Weight as of this encounter: 150 lb (68 kg).  Last encounter: 03/08/2024. Last procedure: Visit date not found.  Reason for encounter: medication management. The patient indicates doing well with current medication regimen. No adverse reaction or side effects reported to medication.  Although the patient continues to experience bilateral hand and leg pain, her current medication regimen helps control symptoms and allows her to perform daily functional activities. Pharmacotherapy Assessment   Hydrocodone -acetaminophen  (Norco/Vicodin) 5-325 mg tablet every 6 hours as needed for severe pain. MME=20  Monitoring: [place] PMP: PDMP reviewed during this encounter.       Pharmacotherapy: No side-effects or adverse reactions reported. Compliance: No problems identified. Effectiveness: Clinically acceptable.  Elizabeth [name], [place]  06/08/2024  9:03 AM  Sign when Signing Visit Nursing Pain Medication Assessment:  Safety precautions to be maintained throughout the outpatient stay will include: orient to surroundings, keep bed in low position, maintain call bell within reach at all times, provide assistance with transfer out of bed and ambulation.  Medication Inspection Compliance: Pill count conducted under aseptic conditions, in front of the patient. Neither the pills nor the bottle was removed from the patient's sight at any time. Once count was completed pills were immediately returned to the patient in their original bottle.  Medication: Hydrocodone /APAP Pill/Patch Count: 14 of 120 pills/patches remain Pill/Patch Appearance: Markings consistent with prescribed medication Bottle Appearance: Standard pharmacy container. Clearly labeled. Filled Date: 08 / 02 / 2025 Last Medication intake:  Today  UDS:  Summary  Date Value Ref Range Status  03/08/2024 FINAL  Final    Comment:     ====================================================================  [employer] Select 13 (MW) ==================================================================== Specimen Alert Note: Urinary creatinine is low; ability to detect some drugs may be compromised. Interpret results with caution. (Creatinine) ==================================================================== Test                             Result       Flag       Units  Drug Present and Declared for Prescription Verification   Hydrocodone                     6045         EXPECTED   ng/mg creat   Hydromorphone                  1045         EXPECTED   ng/mg creat   Dihydrocodeine                 1109         EXPECTED   ng/mg creat   Norhydrocodone                 3236         EXPECTED   ng/mg creat    Sources of hydrocodone  include scheduled prescription medications.    Hydromorphone, dihydrocodeine and norhydrocodone are expected    metabolites of hydrocodone . Hydromorphone and dihydrocodeine are    also available as scheduled prescription medications.  ==================================================================== Test                      Result    Flag   Units      Ref Range   Creatinine              11        LL     mg/dL      >=79 ==================================================================== Declared Medications:  The flagging and interpretation on this report are based on the  following declared medications.  Unexpected results may arise from  inaccuracies in the declared medications.   **Note: The testing scope of this panel includes these medications:   Hydrocodone    **Note: The testing scope of this panel does not include the  following reported medications:   Acetaminophen   Albuterol  (Ventolin  HFA)  Atorvastatin  (Lipitor)  Buspirone  (Buspar )  Carvedilol  (Coreg )  Formoterol  (Dulera )  Iron   Mometasone  (Dulera )  Naloxone  (Narcan )  Nicotine   Pantoprazole  (Protonix )  Potassium (Klor-Con )   Prednisone  (Deltasone )  Spironolactone  (Aldactone )  Torsemide  (Demadex )  Vitamin B12 ==================================================================== For clinical consultation, please call [phone]. ====================================================================     No results found for: CBDTHCR No results found for: D8THCCBX No results found for: D9THCCBX  ROS  Constitutional: Denies any fever or chills Gastrointestinal: No reported hemesis, hematochezia, vomiting, or acute GI distress Musculoskeletal: Arm Pain (Right ) and Leg Pain (Right ) Neurological: No reported episodes of acute onset apraxia, aphasia, dysarthria, agnosia, amnesia, paralysis, loss of coordination, or loss of consciousness  Medication Review  HYDROcodone -acetaminophen , albuterol , atorvastatin , busPIRone , carvedilol , cyanocobalamin , iron  polysaccharides, mometasone -formoterol , nicotine , pantoprazole , potassium chloride  SA, spironolactone , sulfamethoxazole -trimethoprim , and torsemide   History Review  Allergy: Elizabeth [name] is allergic to lisinopril. Drug: Elizabeth [name]  reports no history of drug use. Alcohol:  reports no history of alcohol use. Tobacco:  reports that she quit smoking about 6 years ago. Her smoking use included cigarettes. She started smoking about 46 years ago. She has a 60 pack-year smoking history. She has never used  smokeless tobacco. Social: Elizabeth [name]  reports that she quit smoking about 6 years ago. Her smoking use included cigarettes. She started smoking about 46 years ago. She has a 60 pack-year smoking history. She has never used smokeless tobacco. She reports that she does not drink alcohol and does not use drugs. Medical:  has a past medical history of Anxiety, Arm pain (07/25/2015), CHF (congestive heart failure) (HCC), Congestive heart failure (HCC) (07/25/2015), COPD (chronic obstructive pulmonary disease) (HCC), Depression, Hypertension, Lower extremity pain  (07/25/2015), Reflux, RSD (reflex sympathetic dystrophy), Spinal cord stimulator status, Vitamin B12 deficiency, and Vitamin D  deficiency. Surgical: Elizabeth [name]  has a past surgical history that includes Spinal cord stimulator implant; Breast biopsy (Right, 05/10/2023); and Breast biopsy (Right, 05/10/2023). Family: family history includes Cancer in her father; Cirrhosis in her mother.  Laboratory Chemistry Profile   Renal Lab Results  Component Value Date   BUN 49 (H) 03/03/2024   CREATININE 1.10 (H) 03/03/2024   BCR 17 08/30/2017   GFRAA >60 08/18/2018   GFRNONAA 56 (L) 03/03/2024    Hepatic Lab Results  Component Value Date   AST 17 02/27/2024   ALT 16 02/27/2024   ALBUMIN 3.5 02/27/2024   ALKPHOS 80 02/27/2024    Electrolytes Lab Results  Component Value Date   NA 128 (L) 03/03/2024   K 3.6 03/03/2024   CL 90 (L) 03/03/2024   CALCIUM  8.3 (L) 03/03/2024   MG 2.6 (H) 02/27/2024   PHOS 3.4 04/20/2021    Bone Lab Results  Component Value Date   VD25OH 25.3 (L) 03/18/2016   VD125OH2TOT 76.2 03/18/2016   25OHVITD1 37 08/30/2017   25OHVITD2 1.0 08/30/2017   25OHVITD3 36 08/30/2017    Inflammation (CRP: Acute Phase) (ESR: Chronic Phase) Lab Results  Component Value Date   CRP 0.6 03/02/2024   ESRSEDRATE 30 08/30/2017         Note: Above Lab results reviewed.  Recent Imaging Review  US  Venous Img Lower Bilateral (DVT) CLINICAL DATA:  Pain in swelling in the legs bilaterally.  EXAM: Bilateral LOWER EXTREMITY VENOUS DOPPLER ULTRASOUND  TECHNIQUE: Gray-scale sonography with compression, as well as color and duplex ultrasound, were performed to evaluate the deep venous system(s) from the level of the common femoral vein through the popliteal and proximal calf veins.  COMPARISON:  Left lower extremity venous Doppler ultrasound March 16, 2006. Right lower extremity venous Doppler ultrasound August 15, 2010  FINDINGS: VENOUS  Normal compressibility of the  common femoral, superficial femoral, and popliteal veins, as well as the visualized calf veins. Visualized portions of profunda femoral vein and great saphenous vein unremarkable. No filling defects to suggest DVT on grayscale or color Doppler imaging. Doppler waveforms show normal direction of venous flow, normal respiratory plasticity and response to augmentation.  Limited views of the contralateral common femoral vein are unremarkable.  OTHER  None.  Limitations: none  IMPRESSION: No DVT bilateral lower extremities.  Electronically Signed   By: Cordella Banner   On: 02/25/2024 07:55 Note: Reviewed        Physical Exam  Vitals: BP (!) 123/57 (BP Location: Right Arm, Patient Position: Sitting, Cuff Size: Normal)   Pulse 79   Temp 97.6 F (36.4 C) (Temporal)   Resp 18   Ht 4' 9 (1.448 m)   Wt 150 lb (68 kg)   SpO2 97%   BMI 32.46 kg/m  BMI: Estimated body mass index is 32.46 kg/m as calculated from the following:   Height as of  this encounter: 4' 9 (1.448 m).   Weight as of this encounter: 150 lb (68 kg). Ideal: Patient must be at least 60 in tall to calculate ideal body weight General appearance: Well nourished, well developed, and well hydrated. In no apparent acute distress Mental status: Alert, oriented x 3 (person, place, & time)       Respiratory: No evidence of acute respiratory distress Eyes: PERLA   Assessment   Diagnosis Status  1. Chronic lower extremity pain (1ry area of Pain) (Bilateral)   2. Chronic low back pain (Bilateral) w/o sciatica   3. Lumbar spondylosis   4. Lumbar facet syndrome (Bilateral)   5. Complex regional pain syndrome type 1 of lower extremities (Bilateral)   6. Complex regional pain syndrome type 1 of upper extremities (Bilateral)   7. Chronic pain syndrome   8. Pharmacologic therapy   9. Chronic use of opiate for therapeutic purpose   10. Encounter for medication management   11. Encounter for chronic pain management     Controlled Controlled Controlled   Updated Problems: No problems updated.  Plan of Care  Problem-specific:  Assessment and Plan Will continue on current medication regimen.  Prescribing drug monitoring (PDMP) reviewed; findings consistent with use of prescribed medication and no evidence of narcotic misuse or abuse. Urine drug screening (UDS) up to date.  No other issues or problems reported to this visit.  Schedule follow-up in 90 days for medication management.   Elizabeth [name] has a current medication list which includes the following long-term medication(s): albuterol , albuterol , atorvastatin , carvedilol , hydrocodone -acetaminophen , iron  polysaccharides, mometasone -formoterol , and torsemide .  Pharmacotherapy (Medications Ordered): No orders of the defined types were placed in this encounter.  Orders:  No orders of the defined types were placed in this encounter.       No follow-ups on file.    Recent Visits No visits were found meeting these conditions. Showing recent visits within past 90 days and meeting all other requirements Today's Visits Date Type Provider Dept  06/08/24 Office Visit Glee Lashomb K, NP [facility]  Showing today's visits and meeting all other requirements Future Appointments No visits were found meeting these conditions. Showing future appointments within next 90 days and meeting all other requirements  I discussed the assessment and treatment plan with the patient. The patient was provided an opportunity to ask questions and all were answered. The patient agreed with the plan and demonstrated an understanding of the instructions.  Patient advised to call back or seek an in-person evaluation if the symptoms or condition worsens.  Duration of encounter: 30 minutes.  Total time on encounter, as per AMA guidelines included both the face-to-face and non-face-to-face time personally spent by the physician and/or other qualified health  care professional(s) on the day of the encounter (includes time in activities that require the physician or other qualified health care professional and does not include time in activities normally performed by clinical staff). Physician's time may include the following activities when performed: Preparing to see the patient (e.g., pre-charting review of records, searching for previously ordered imaging, lab work, and nerve conduction tests) Review of prior analgesic pharmacotherapies. Reviewing PMP Interpreting ordered tests (e.g., lab work, imaging, nerve conduction tests) Performing post-procedure evaluations, including interpretation of diagnostic procedures Obtaining and/or reviewing separately obtained history Performing a medically appropriate examination and/or evaluation Counseling and educating the patient/family/caregiver Ordering medications, tests, or procedures Referring and communicating with other health care professionals (when not separately reported) Documenting clinical information in  the electronic or other health record Independently interpreting results (not separately reported) and communicating results to the patient/ family/caregiver Care coordination (not separately reported)  Note by: Tamecia Mcdougald K Zerek Litsey, NP (TTS and AI technology used. I apologize for any typographical errors that were not detected and corrected.) Date: 06/08/2024; Time: 9:12 AM

## 2024-06-08 NOTE — Progress Notes (Signed)
 Nursing Pain Medication Assessment:  Safety precautions to be maintained throughout the outpatient stay will include: orient to surroundings, keep bed in low position, maintain call bell within reach at all times, provide assistance with transfer out of bed and ambulation.  Medication Inspection Compliance: Pill count conducted under aseptic conditions, in front of the patient. Neither the pills nor the bottle was removed from the patient's sight at any time. Once count was completed pills were immediately returned to the patient in their original bottle.  Medication: Hydrocodone /APAP Pill/Patch Count: 14 of 120 pills/patches remain Pill/Patch Appearance: Markings consistent with prescribed medication Bottle Appearance: Standard pharmacy container. Clearly labeled. Filled Date: 08 / 02 / 2025 Last Medication intake:  Today

## 2024-08-29 ENCOUNTER — Encounter: Payer: Self-pay | Admitting: Nurse Practitioner

## 2024-08-29 ENCOUNTER — Ambulatory Visit: Attending: Nurse Practitioner | Admitting: Nurse Practitioner

## 2024-08-29 DIAGNOSIS — G90523 Complex regional pain syndrome I of lower limb, bilateral: Secondary | ICD-10-CM | POA: Diagnosis present

## 2024-08-29 DIAGNOSIS — M545 Low back pain, unspecified: Secondary | ICD-10-CM | POA: Insufficient documentation

## 2024-08-29 DIAGNOSIS — M79604 Pain in right leg: Secondary | ICD-10-CM | POA: Diagnosis not present

## 2024-08-29 DIAGNOSIS — Z79891 Long term (current) use of opiate analgesic: Secondary | ICD-10-CM | POA: Diagnosis present

## 2024-08-29 DIAGNOSIS — Z79899 Other long term (current) drug therapy: Secondary | ICD-10-CM | POA: Insufficient documentation

## 2024-08-29 DIAGNOSIS — G894 Chronic pain syndrome: Secondary | ICD-10-CM | POA: Diagnosis present

## 2024-08-29 DIAGNOSIS — G90513 Complex regional pain syndrome I of upper limb, bilateral: Secondary | ICD-10-CM | POA: Insufficient documentation

## 2024-08-29 DIAGNOSIS — M79605 Pain in left leg: Secondary | ICD-10-CM | POA: Diagnosis present

## 2024-08-29 DIAGNOSIS — M47816 Spondylosis without myelopathy or radiculopathy, lumbar region: Secondary | ICD-10-CM | POA: Diagnosis present

## 2024-08-29 DIAGNOSIS — G8929 Other chronic pain: Secondary | ICD-10-CM | POA: Diagnosis present

## 2024-08-29 MED ORDER — HYDROCODONE-ACETAMINOPHEN 5-325 MG PO TABS: 1.0000 | ORAL_TABLET | Freq: Four times a day (QID) | ORAL | 0 refills | Status: AC | PRN

## 2024-08-29 NOTE — Progress Notes (Signed)
 6Nursing Pain Medication Assessment:  Safety precautions to be maintained throughout the outpatient stay will include: orient to surroundings, keep bed in low position, maintain call bell within reach at all times, provide assistance with transfer out of bed and ambulation.  Medication Inspection Compliance: Pill count conducted under aseptic conditions, in front of the patient. Neither the pills nor the bottle was removed from the patient's sight at any time. Once count was completed pills were immediately returned to the patient in their original bottle.  Medication: Hydrocodone /APAP Pill/Patch Count: 48 of 120 pills/patches remain Pill/Patch Appearance: Markings consistent with prescribed medication Bottle Appearance: Standard pharmacy container. Clearly labeled. Filled Date: 37 / 31 / 2025 Last Medication intake:  Today

## 2024-08-29 NOTE — Progress Notes (Signed)
 PROVIDER NOTE: Interpretation of information contained herein should be left to medically-trained personnel. Specific patient instructions are provided elsewhere under Patient Instructions section of medical record. This document was created in part using AI and STT-dictation technology, any transcriptional errors that may result from this process are unintentional.  Patient: Elizabeth [name]  Service: E/M   PCP: Rollene Therisa BRAVO, FNP (Inactive)  DOB: 1961-06-27  DOS: 08/29/2024  Provider: Emmy MARLA Blanch, NP  MRN: [id number]  Delivery: Face-to-face  Specialty: Interventional Pain Management  Type: Established Patient  Setting: Ambulatory outpatient facility  Specialty designation: 09  Referring Prov.: No ref. provider found  Location: Outpatient office facility       History of present illness (HPI) [name], a 63 y.o. year old female, is here today because of her bilateral arm pain. [name]'s primary complain today is Arm Pain  Pertinent problems: Elizabeth [name]  has Chronic low back pain (bilateral) w/o sciatica; Presence of spinal cord stimulator; Chronic lower extremity pain (1ry area of pain) (Bilateral); Lumbar facet syndrome (Bilateral); Complex regional pain syndrome type I of upper extremities (Bilateral); Complex regional pain syndrome type 1 of lower extremities (Bilateral); Bilateral leg edema; and Chronic pain syndrome on their pertinent problem list   Pain Assessment: Severity of Chronic pain is reported as a 3 /10. Location: Arm Right/Radiates from right shoulder to hand. Onset: More than a month ago. Quality: Aching, Stabbing, Burning. Timing: Constant. Modifying factor(s): Pain Medication. Vitals:  height is 4' 9 (1.448 m) and weight is 150 lb (68 kg). Her temporal temperature is 97.7 F (36.5 C). Her blood pressure is 134/68 and her pulse is 76. Her respiration is 18 and oxygen saturation is 97%.  BMI: Estimated body mass index is 32.46 kg/m as calculated  from the following:   Height as of this encounter: 4' 9 (1.448 m).   Weight as of this encounter: 150 lb (68 kg).  Last encounter: 06/08/2024. Last procedure: Visit date not found.  Reason for encounter: medication management. The patient indicates doing well with current medication regimen. No adverse reaction or side effects reported to medication.  Although the patient continues to experience bilateral hand and leg pain, her current medication regimen helps control symptoms and allows her to perform daily functional activities.   Discussed the use of AI scribe software for clinical note transcription with the patient, who gave verbal consent to proceed.  History of Present Illness   Elizabeth [name] is a 63 year old female with Reflex Sympathetic Dystrophy who presents with pain management concerns.  She experiences pain primarily in her right arm and legs, attributed to her diagnosis of Reflex Sympathetic Dystrophy (RSD). The pain is described as jumping between her extremities.  She is currently taking Norco 5-325 mg every six hours, totaling four doses per day, with no side effects or adverse reactions. She manages potential constipation with additional measures.     Pharmacotherapy Assessment   Hydrocodone -acetaminophen  (Norco/Vicodin) 5-325 mg tablet every 6 hours as needed for severe pain. MME=20  Monitoring: [place] PMP: PDMP reviewed during this encounter.       Pharmacotherapy: No side-effects or adverse reactions reported. Compliance: No problems identified. Effectiveness: Clinically acceptable.  Elizabeth [name], [place]  08/29/2024 11:36 AM  Sign when Signing Visit 6Nursing Pain Medication Assessment:  Safety precautions to be maintained throughout the outpatient stay will include: orient to surroundings, keep bed in low position, maintain call bell within reach at all times, provide assistance with transfer  out of bed and ambulation.  Medication Inspection Compliance: Pill  count conducted under aseptic conditions, in front of the patient. Neither the pills nor the bottle was removed from the patient's sight at any time. Once count was completed pills were immediately returned to the patient in their original bottle.  Medication: Hydrocodone /APAP Pill/Patch Count: 48 of 120 pills/patches remain Pill/Patch Appearance: Markings consistent with prescribed medication Bottle Appearance: Standard pharmacy container. Clearly labeled. Filled Date: 75 / 31 / 2025 Last Medication intake:  Today    UDS:  Summary  Date Value Ref Range Status  03/08/2024 FINAL  Final    Comment:    ==================================================================== [employer] 13 (MW) ==================================================================== Specimen Alert Note: Urinary creatinine is low; ability to detect some drugs may be compromised. Interpret results with caution. (Creatinine) ==================================================================== Test                             Result       Flag       Units  Drug Present and Declared for Prescription Verification   Hydrocodone                     6045         EXPECTED   ng/mg creat   Hydromorphone                  1045         EXPECTED   ng/mg creat   Dihydrocodeine                 1109         EXPECTED   ng/mg creat   Norhydrocodone                 3236         EXPECTED   ng/mg creat    Sources of hydrocodone  include scheduled prescription medications.    Hydromorphone, dihydrocodeine and norhydrocodone are expected    metabolites of hydrocodone . Hydromorphone and dihydrocodeine are    also available as scheduled prescription medications.  ==================================================================== Test                      Result    Flag   Units      Ref Range   Creatinine              11        LL     mg/dL      >=79 ==================================================================== Declared  Medications:  The flagging and interpretation on this report are based on the  following declared medications.  Unexpected results may arise from  inaccuracies in the declared medications.   **Note: The testing scope of this panel includes these medications:   Hydrocodone    **Note: The testing scope of this panel does not include the  following reported medications:   Acetaminophen   Albuterol  (Ventolin  HFA)  Atorvastatin  (Lipitor)  Buspirone  (Buspar )  Carvedilol  (Coreg )  Formoterol  (Dulera )  Iron   Mometasone  (Dulera )  Naloxone  (Narcan )  Nicotine   Pantoprazole  (Protonix )  Potassium (Klor-Con )  Prednisone  (Deltasone )  Spironolactone  (Aldactone )  Torsemide  (Demadex )  Vitamin B12 ==================================================================== For clinical consultation, please call [phone]. ====================================================================     No results found for: CBDTHCR No results found for: D8THCCBX No results found for: D9THCCBX  ROS  Constitutional: Denies any fever or chills Gastrointestinal: No reported hemesis, hematochezia, vomiting, or acute GI distress Musculoskeletal: Bilateral arm  pain Neurological: No reported episodes of acute onset apraxia, aphasia, dysarthria, agnosia, amnesia, paralysis, loss of coordination, or loss of consciousness  Medication Review  HYDROcodone -acetaminophen , albuterol , atorvastatin , busPIRone , carvedilol , iron  polysaccharides, mometasone -formoterol , pantoprazole , potassium chloride  SA, spironolactone , and torsemide   History Review  Allergy: Elizabeth [name] is allergic to lisinopril. Drug: Elizabeth [name]  reports no history of drug use. Alcohol:  reports no history of alcohol use. Tobacco:  reports that she quit smoking about 6 years ago. Her smoking use included cigarettes. She started smoking about 46 years ago. She has a 60 pack-year smoking history. She has never used smokeless tobacco. Social:  Ms. Birenbaum  reports that she quit smoking about 6 years ago. Her smoking use included cigarettes. She started smoking about 46 years ago. She has a 60 pack-year smoking history. She has never used smokeless tobacco. She reports that she does not drink alcohol and does not use drugs. Medical:  has a past medical history of Anxiety, Arm pain (07/25/2015), CHF (congestive heart failure) (HCC), Congestive heart failure (HCC) (07/25/2015), COPD (chronic obstructive pulmonary disease) (HCC), Depression, Hypertension, Lower extremity pain (07/25/2015), Reflux, RSD (reflex sympathetic dystrophy), Spinal cord stimulator status, Vitamin B12 deficiency, and Vitamin D  deficiency. Surgical: Ms. Heymann  has a past surgical history that includes Spinal cord stimulator implant; Breast biopsy (Right, 05/10/2023); and Breast biopsy (Right, 05/10/2023). Family: family history includes Cancer in her father; Cirrhosis in her mother.  Laboratory Chemistry Profile   Renal Lab Results  Component Value Date   BUN 49 (H) 03/03/2024   CREATININE 1.10 (H) 03/03/2024   BCR 17 08/30/2017   GFRAA >60 08/18/2018   GFRNONAA 56 (L) 03/03/2024    Hepatic Lab Results  Component Value Date   AST 17 02/27/2024   ALT 16 02/27/2024   ALBUMIN 3.5 02/27/2024   ALKPHOS 80 02/27/2024    Electrolytes Lab Results  Component Value Date   NA 128 (L) 03/03/2024   K 3.6 03/03/2024   CL 90 (L) 03/03/2024   CALCIUM  8.3 (L) 03/03/2024   MG 2.6 (H) 02/27/2024   PHOS 3.4 04/20/2021    Bone Lab Results  Component Value Date   VD25OH 25.3 (L) 03/18/2016   VD125OH2TOT 76.2 03/18/2016   25OHVITD1 37 08/30/2017   25OHVITD2 1.0 08/30/2017   25OHVITD3 36 08/30/2017    Inflammation (CRP: Acute Phase) (ESR: Chronic Phase) Lab Results  Component Value Date   CRP 0.6 03/02/2024   ESRSEDRATE 30 08/30/2017         Note: Above Lab results reviewed.  Recent Imaging Review  US  Venous Img Lower Bilateral (DVT) CLINICAL DATA:  Pain in  swelling in the legs bilaterally.  EXAM: Bilateral LOWER EXTREMITY VENOUS DOPPLER ULTRASOUND  TECHNIQUE: Gray-scale sonography with compression, as well as color and duplex ultrasound, were performed to evaluate the deep venous system(s) from the level of the common femoral vein through the popliteal and proximal calf veins.  COMPARISON:  Left lower extremity venous Doppler ultrasound March 16, 2006. Right lower extremity venous Doppler ultrasound August 15, 2010  FINDINGS: VENOUS  Normal compressibility of the common femoral, superficial femoral, and popliteal veins, as well as the visualized calf veins. Visualized portions of profunda femoral vein and great saphenous vein unremarkable. No filling defects to suggest DVT on grayscale or color Doppler imaging. Doppler waveforms show normal direction of venous flow, normal respiratory plasticity and response to augmentation.  Limited views of the contralateral common femoral vein are unremarkable.  OTHER  None.  Limitations: none  IMPRESSION:  No DVT bilateral lower extremities.  Electronically Signed   By: Cordella Banner   On: 02/25/2024 07:55 Note: Reviewed        Physical Exam  Vitals: BP 134/68 (BP Location: Right Arm, Patient Position: Sitting, Cuff Size: Normal)   Pulse 76   Temp 97.7 F (36.5 C) (Temporal)   Resp 18   Ht 4' 9 (1.448 m)   Wt 150 lb (68 kg)   SpO2 97%   BMI 32.46 kg/m  BMI: Estimated body mass index is 32.46 kg/m as calculated from the following:   Height as of this encounter: 4' 9 (1.448 m).   Weight as of this encounter: 150 lb (68 kg). Ideal: Patient must be at least 60 in tall to calculate ideal body weight General appearance: Well nourished, well developed, and well hydrated. In no apparent acute distress Mental status: Alert, oriented x 3 (person, place, & time)       Respiratory: No evidence of acute respiratory distress Eyes: PERLA  Musculoskeletal: Bilateral arm pain due  to (RSD) Assessment   Diagnosis Status  1. Complex regional pain syndrome type 1 of upper extremities (Bilateral)   2. Chronic pain syndrome   3. Pharmacologic therapy   4. Chronic lower extremity pain (1ry area of Pain) (Bilateral)   5. Chronic low back pain (Bilateral) w/o sciatica   6. Lumbar spondylosis   7. Lumbar facet syndrome (Bilateral)   8. Complex regional pain syndrome type 1 of lower extremities (Bilateral)   9. Chronic use of opiate for therapeutic purpose   10. Encounter for medication management   11. Encounter for chronic pain management    Controlled Controlled Controlled   Updated Problems: No problems updated.  Plan of Care  Problem-specific:  Assessment and Plan    Complex regional pain syndrome of right arm and legs Chronic pain in right arm and legs consistent with CRPS. Pain managed with Norco 5-325 mg every six hours. No side effects reported. Constipation discussed; advised increased water  and fiber intake. - Continue Norco 5-325 mg every six hours. - Advised increased water  and fiber intake to manage constipation. - Sent prescription to [facility] for early refill on November 26th, 2025.  Chronic pain syndrome Managed with Norco. - Continue current pain management regimen with Norco.  Patient's pain is controlled with hydrocodone , will continue on current medication regimen. Prescribing drug monitoring (PDMP) reviewed; findings consistent with use of prescribed medication and no evidence of narcotic misuse or abuse. Urine drug screening (UDS) up to date. Advised increased water  and fiber intake to manage constipation. Schedule follow-up in 90 days for medication management.   Pharmacotherapy:  Meds ordered this encounter  Medications   HYDROcodone -acetaminophen  (NORCO/VICODIN) 5-325 MG tablet    Sig: Take 1 tablet by mouth every 6 (six) hours as needed for severe pain (pain score 7-10). Must last 30 days.    Dispense:  120 tablet    Refill:   0    May refill on 09/06/2024 only for this month.   HYDROcodone -acetaminophen  (NORCO/VICODIN) 5-325 MG tablet    Sig: Take 1 tablet by mouth every 6 (six) hours as needed for severe pain (pain score 7-10). Must last 30 days.    Dispense:  120 tablet    Refill:  0    DO NOT: delete (not duplicate); no partial-fill (will deny script to complete), no refill request (F/U required). DISPENSE: 1 day early if closed on fill date. WARN: No CNS-depressants within 8 hrs of med.  HYDROcodone -acetaminophen  (NORCO/VICODIN) 5-325 MG tablet    Sig: Take 1 tablet by mouth every 6 (six) hours as needed for severe pain (pain score 7-10). Must last 30 days.    Dispense:  120 tablet    Refill:  0    DO NOT: delete (not duplicate); no partial-fill (will deny script to complete), no refill request (F/U required). DISPENSE: 1 day early if closed on fill date. WARN: No CNS-depressants within 8 hrs of med.        [name] has a current medication list which includes the following long-term medication(s): albuterol , albuterol , atorvastatin , carvedilol , iron  polysaccharides, mometasone -formoterol , torsemide , [START ON 09/10/2024] hydrocodone -acetaminophen , [START ON 10/10/2024] hydrocodone -acetaminophen , and [START ON 11/09/2024] hydrocodone -acetaminophen .  Pharmacotherapy (Medications Ordered): Meds ordered this encounter  Medications   HYDROcodone -acetaminophen  (NORCO/VICODIN) 5-325 MG tablet    Sig: Take 1 tablet by mouth every 6 (six) hours as needed for severe pain (pain score 7-10). Must last 30 days.    Dispense:  120 tablet    Refill:  0    May refill on 09/06/2024 only for this month.   HYDROcodone -acetaminophen  (NORCO/VICODIN) 5-325 MG tablet    Sig: Take 1 tablet by mouth every 6 (six) hours as needed for severe pain (pain score 7-10). Must last 30 days.    Dispense:  120 tablet    Refill:  0    DO NOT: delete (not duplicate); no partial-fill (will deny script to complete), no refill  request (F/U required). DISPENSE: 1 day early if closed on fill date. WARN: No CNS-depressants within 8 hrs of med.   HYDROcodone -acetaminophen  (NORCO/VICODIN) 5-325 MG tablet    Sig: Take 1 tablet by mouth every 6 (six) hours as needed for severe pain (pain score 7-10). Must last 30 days.    Dispense:  120 tablet    Refill:  0    DO NOT: delete (not duplicate); no partial-fill (will deny script to complete), no refill request (F/U required). DISPENSE: 1 day early if closed on fill date. WARN: No CNS-depressants within 8 hrs of med.   Orders:  No orders of the defined types were placed in this encounter.       Return in about 3 months (around 11/29/2024) for (F2F), (MM), Emmy Blanch NP.    Recent Visits Date Type Provider Dept  06/08/24 Office Visit Chontel Warning K, NP [facility]  Showing recent visits within past 90 days and meeting all other requirements Today's Visits Date Type Provider Dept  08/29/24 Office Visit Manaal Mandala K, NP [facility]  Showing today's visits and meeting all other requirements Future Appointments No visits were found meeting these conditions. Showing future appointments within next 90 days and meeting all other requirements  I discussed the assessment and treatment plan with the patient. The patient was provided an opportunity to ask questions and all were answered. The patient agreed with the plan and demonstrated an understanding of the instructions.  Patient advised to call back or seek an in-person evaluation if the symptoms or condition worsens.  I personally spent a total of 30 minutes in the care of the patient today including preparing to see the patient, getting/reviewing separately obtained history, performing a medically appropriate exam/evaluation, counseling and educating, placing orders, referring and communicating with other health care professionals, documenting clinical information in the EHR, independently interpreting  results, communicating results, and coordinating care.   Note by: Susann Lawhorne K Kevia Zaucha, NP (TTS and AI technology used. I apologize for any typographical errors  that were not detected and corrected.) Date: 08/29/2024; Time: 12:40 PM

## 2024-10-18 NOTE — Progress Notes (Signed)
 Established Patient Visit   Chief Complaint: Chief Complaint  Patient presents with   Follow-up    6 month follow up.     Date of Service: 10/23/2024 Date of Birth: May 22, 1961 PCP: Trinidad Cassondra Fell, MD  History of Present Illness: Elizabeth [name] is a 63 y.o.female patient who presents for a 6 month follow up. PMH significant for edema, OSA, CHF, bruits, SOB, chest pain, hypoxemia, COPD, hypertension, anxiety.  Patient does not use CPAP for sleep apnea. Recommend CPAP use. Encouraged to regularly exercise. She has been going to the pain clinic for 23 years. Start losartan. Stop coreg . Order cardiac CTA with FFT for further assessment of SOB.   Visit Summaries: 03/31/2024 Patient was seen by me for a follow up. Scheduled with HF clinic.  Past Medical and Surgical History  Past Medical History Past Medical History:  Diagnosis Date   CHF (congestive heart failure) (CMS/HHS-HCC)    COPD (chronic obstructive pulmonary disease) (CMS/HHS-HCC)    Hyperlipidemia    Hypertension     Past Surgical History She has a past surgical history that includes spinal cord stimulator.   Medications and Allergies  Current Medications  Current Outpatient Medications  Medication Sig Dispense Refill   albuterol  90 mcg/actuation inhaler Inhale 2 inhalations into the lungs every 4 (four) hours as needed     atorvastatin  (LIPITOR) 40 MG tablet Take 1 tablet by mouth nightly     carvediloL  (COREG ) 3.125 MG tablet Take 1 tablet by mouth 2 (two) times daily     docusate sodium  (COLACE ORAL) Take 100 % by mouth 2 (two) times daily Pt does not know dosage     DULERA  100-5 mcg/actuation inhaler Inhale 2 inhalations into the lungs 2 (two) times daily     fluticasone  propion-salmeteroL (ADVAIR DISKUS) 100-50 mcg/dose diskus inhaler Inhale 1 Puff into the lungs every 12 (twelve) hours 3 each 3   HYDROcodone -acetaminophen  (NORCO) 5-325 mg tablet Take 1 tablet by mouth every 6 (six) hours as needed  for Pain     ipratropium-albuteroL  (DUO-NEB) nebulizer solution Inhale into the lungs 4 (four) times daily as needed     omeprazole (PRILOSEC) 20 MG DR capsule Take 1 capsule by mouth once daily     pantoprazole  (PROTONIX ) 40 MG DR tablet Take 40 mg by mouth at bedtime     potassium chloride  (KLOR-CON ) 20 MEQ ER tablet Take 1 tablet by mouth once daily     spironolactone  (ALDACTONE ) 25 MG tablet Take 1 tablet (25 mg total) by mouth once daily 30 tablet 11   TORsemide  (DEMADEX ) 100 MG tablet Take 1 tablet (100 mg total) by mouth once daily for 360 days 90 tablet 3   losartan (COZAAR) 50 MG tablet Take 1 tablet (50 mg total) by mouth once daily 90 tablet 1   metoprolol  TARTrate (LOPRESSOR ) 25 MG tablet Take one tablet ( 25 mg) by mouth two hours prior to CT Scan 1 tablet 0   semaglutide (OZEMPIC) 0.25 mg or 0.5 mg (2 mg/3 mL) pen injector Inject 0.375 mLs (0.25 mg total) subcutaneously once a week (Patient not taking: Reported on 10/23/2024) 2 mL 2   No current facility-administered medications for this visit.    Allergies: Lisinopril  Social and Family History  Social History  reports that she quit smoking about 5 years ago. Her smoking use included cigarettes. She has never used smokeless tobacco. She reports that she does not drink alcohol and does not use drugs.  Family History Family History  Problem Relation Name Age of Onset   Cirrhosis Mother     Cancer Father         throat   Alcohol abuse Father     Colon cancer Neg Hx      Review of Systems   Pertinent positives and negatives are mentioned above in HPI and all other systems are negative.  Physical Examination   Vitals:BP 136/78   Pulse 67   Ht 144.8 cm (4' 9)   Wt 76 kg (167 lb 9.6 oz)   LMP 10/12/2001 (LMP Unknown)   SpO2 96%   BMI 36.27 kg/m  Ht:144.8 cm (4' 9) Wt:76 kg (167 lb 9.6 oz) ADJ:Anib surface area is 1.75 meters squared. Body mass index is 36.27 kg/m.  HEENT: Pupils equally reactive  to light and accomodation  Neck: Supple without thyromegaly, carotid pulses 2+ Lungs: clear to auscultation bilaterally; no wheezes, rales, rhonchi Heart: Regular rate and rhythm.  No gallops, murmurs or rub Abdomen: soft nontender, nondistended, with normal bowel sounds Extremities: no cyanosis, clubbing, or edema Peripheral Pulses: 2+ in all extremities, 2+ femoral pulses bilaterally Neurologic: Alert and oriented X3; speech intact; face symmetrical; moves all extremities well  Cardiovascular Studies:    Echocardiogram 2D complete: 05/12/2023 INTERPRETATION NORMAL LEFT VENTRICULAR SYSTOLIC FUNCTION NORMAL RIGHT VENTRICULAR SYSTOLIC FUNCTION MILD VALVULAR REGURGITATION (See above) NO VALVULAR STENOSIS ESTIMATED LVEF: >55% (CALCULATED: 67%) GLS: -18.6%  NM Myocardial Perfusion SPECT multiple (stress and rest): 05/02/2019 Narrative This result has an attachment that is not available.  Blood pressure demonstrated a normal response to exercise.  There was no ST segment deviation noted during stress.  Overall ejection fraction around 50%  The study is normal.  This is a low risk study.  The left ventricular ejection fraction is mildly decreased (45-54%).  Nuclear stress EF: 50%.  Cardiac Catheterization:  05/11/2024 Impressions 7 Fr right femoral venous access Normal RHC filling pressures: normal pulm pressures Normal CO-CI  Holter:  Cardiac CT Scan:  Cardiac MRI:   Assessment   64 y.o. female with  1. Primary hypertension   2. Hyperlipidemia, unspecified hyperlipidemia type   3. Chronic diastolic CHF (congestive heart failure) (CMS/HHS-HCC)   4. OSA (obstructive sleep apnea)   5. SOB (shortness of breath)   6. Chronic obstructive pulmonary disease, unspecified COPD type (CMS/HHS-HCC)   7. Bilateral leg edema   8. Morbid (severe) obesity due to excess calories (CMS-HCC)   9. Gastroesophageal reflux disease, unspecified whether esophagitis present     Plan   Hypertension, today's BP was 136/78, reasonably controlled, continue stop coreg , start losartan Hyperlipidemia, continue lipitor therapy for lipid management OSA, does not use CPAP, recommend CPAP use, recommend weight loss SOB, order cardiac CTA with FFR for further assessment  COPD, continue inhaler use as needed, follow up with pulmonary as needed  Edema, continue torsemide , spironolactone , recommend support stockings, BLE elevation, and reducing sodium intake Obesity, currently on ozempic, recommend weight loss, exercise, and portion control GERD, continue omeprazole/protonix  therapy for reflux type symptoms    Return in about 6 months (around 04/22/2025).  This note is partially written by Elizabeth [name], in the presence of and acting as the scribe of Dr. Cara Lovelace.      Elizabeth [name]  I have reviewed, edited and added to the note to reflect my best personal medical judgment.  Attestation Statement:   I personally performed the service. (TP)  DWAYNE JONETTA LOVELACE, MD  [facility] [place], [place] Ph:  [phone] Fax:  [phone] This note was generated in part with voice recognition software, Dragon.  I apologize for any typographical errors that were not detected and corrected from this process.  They are unintentional.

## 2024-10-24 ENCOUNTER — Other Ambulatory Visit: Payer: Self-pay | Admitting: Internal Medicine

## 2024-10-24 DIAGNOSIS — I10 Essential (primary) hypertension: Secondary | ICD-10-CM

## 2024-10-24 DIAGNOSIS — R0602 Shortness of breath: Secondary | ICD-10-CM

## 2024-11-07 ENCOUNTER — Encounter (HOSPITAL_COMMUNITY): Payer: Self-pay

## 2024-11-09 ENCOUNTER — Ambulatory Visit

## 2024-11-17 ENCOUNTER — Ambulatory Visit (INDEPENDENT_AMBULATORY_CARE_PROVIDER_SITE_OTHER): Admitting: Nurse Practitioner

## 2024-11-27 ENCOUNTER — Ambulatory Visit

## 2024-11-29 ENCOUNTER — Encounter: Admitting: Nurse Practitioner
